# Patient Record
Sex: Female | Born: 1940
Health system: Southern US, Community
[De-identification: ages and names within clinical notes are randomized; demographics above are authoritative.]

## PROBLEM LIST (undated history)

## (undated) DIAGNOSIS — J189 Pneumonia, unspecified organism: Secondary | ICD-10-CM

## (undated) DIAGNOSIS — H3530 Unspecified macular degeneration: Secondary | ICD-10-CM

## (undated) DIAGNOSIS — Z9889 Other specified postprocedural states: Secondary | ICD-10-CM

## (undated) DIAGNOSIS — F32A Depression, unspecified: Secondary | ICD-10-CM

## (undated) DIAGNOSIS — E119 Type 2 diabetes mellitus without complications: Secondary | ICD-10-CM

## (undated) DIAGNOSIS — F329 Major depressive disorder, single episode, unspecified: Secondary | ICD-10-CM

## (undated) DIAGNOSIS — I10 Essential (primary) hypertension: Secondary | ICD-10-CM

## (undated) DIAGNOSIS — R32 Unspecified urinary incontinence: Secondary | ICD-10-CM

## (undated) HISTORY — PX: OTHER SURGICAL HISTORY: SHX169

## (undated) HISTORY — DX: Depression, unspecified: F32.A

## (undated) HISTORY — DX: Type 2 diabetes mellitus without complications: E11.9

## (undated) HISTORY — DX: Major depressive disorder, single episode, unspecified: F32.9

## (undated) HISTORY — PX: ABDOMINAL HYSTERECTOMY: SHX81

## (undated) HISTORY — PX: APPENDECTOMY: SHX54

## (undated) HISTORY — DX: Other specified postprocedural states: Z98.890

## (undated) HISTORY — DX: Unspecified macular degeneration: H35.30

## (undated) HISTORY — DX: Unspecified urinary incontinence: R32

---

## 1991-08-12 HISTORY — PX: BREAST EXCISIONAL BIOPSY: SUR124

## 1996-08-11 HISTORY — PX: BREAST EXCISIONAL BIOPSY: SUR124

## 1997-08-11 ENCOUNTER — Encounter (INDEPENDENT_AMBULATORY_CARE_PROVIDER_SITE_OTHER): Payer: Self-pay | Admitting: *Deleted

## 1997-08-11 LAB — CONVERTED CEMR LAB

## 1997-11-20 ENCOUNTER — Encounter: Admission: RE | Admit: 1997-11-20 | Discharge: 1997-11-20 | Payer: Self-pay | Admitting: Family Medicine

## 1997-11-28 ENCOUNTER — Encounter: Admission: RE | Admit: 1997-11-28 | Discharge: 1997-11-28 | Payer: Self-pay | Admitting: Family Medicine

## 1997-12-22 ENCOUNTER — Encounter: Admission: RE | Admit: 1997-12-22 | Discharge: 1997-12-22 | Payer: Self-pay | Admitting: Family Medicine

## 1998-01-02 ENCOUNTER — Encounter: Admission: RE | Admit: 1998-01-02 | Discharge: 1998-01-02 | Payer: Self-pay | Admitting: Family Medicine

## 1998-01-09 ENCOUNTER — Encounter: Admission: RE | Admit: 1998-01-09 | Discharge: 1998-01-09 | Payer: Self-pay | Admitting: Sports Medicine

## 1998-11-12 ENCOUNTER — Encounter: Admission: RE | Admit: 1998-11-12 | Discharge: 1998-11-12 | Payer: Self-pay | Admitting: Family Medicine

## 1999-04-12 ENCOUNTER — Encounter: Admission: RE | Admit: 1999-04-12 | Discharge: 1999-04-12 | Payer: Self-pay | Admitting: Family Medicine

## 1999-04-12 ENCOUNTER — Ambulatory Visit (HOSPITAL_COMMUNITY): Admission: RE | Admit: 1999-04-12 | Discharge: 1999-04-12 | Payer: Self-pay | Admitting: Family Medicine

## 1999-05-08 ENCOUNTER — Encounter: Admission: RE | Admit: 1999-05-08 | Discharge: 1999-05-08 | Payer: Self-pay | Admitting: Family Medicine

## 1999-05-20 ENCOUNTER — Encounter: Admission: RE | Admit: 1999-05-20 | Discharge: 1999-05-20 | Payer: Self-pay | Admitting: Family Medicine

## 1999-05-27 ENCOUNTER — Encounter: Admission: RE | Admit: 1999-05-27 | Discharge: 1999-05-27 | Payer: Self-pay | Admitting: Family Medicine

## 1999-05-29 ENCOUNTER — Encounter: Admission: RE | Admit: 1999-05-29 | Discharge: 1999-05-29 | Payer: Self-pay | Admitting: Family Medicine

## 1999-05-31 ENCOUNTER — Encounter: Admission: RE | Admit: 1999-05-31 | Discharge: 1999-05-31 | Payer: Self-pay | Admitting: Family Medicine

## 1999-06-10 ENCOUNTER — Encounter: Payer: Self-pay | Admitting: Family Medicine

## 1999-06-10 ENCOUNTER — Ambulatory Visit (HOSPITAL_COMMUNITY): Admission: RE | Admit: 1999-06-10 | Discharge: 1999-06-10 | Payer: Self-pay | Admitting: Family Medicine

## 1999-07-15 ENCOUNTER — Encounter: Admission: RE | Admit: 1999-07-15 | Discharge: 1999-07-15 | Payer: Self-pay | Admitting: Family Medicine

## 1999-08-02 ENCOUNTER — Encounter: Admission: RE | Admit: 1999-08-02 | Discharge: 1999-08-02 | Payer: Self-pay | Admitting: Sports Medicine

## 1999-08-26 ENCOUNTER — Encounter: Admission: RE | Admit: 1999-08-26 | Discharge: 1999-08-26 | Payer: Self-pay | Admitting: Family Medicine

## 1999-08-30 ENCOUNTER — Encounter: Admission: RE | Admit: 1999-08-30 | Discharge: 1999-08-30 | Payer: Self-pay | Admitting: Family Medicine

## 1999-11-13 ENCOUNTER — Encounter: Admission: RE | Admit: 1999-11-13 | Discharge: 1999-11-13 | Payer: Self-pay | Admitting: Family Medicine

## 1999-12-02 ENCOUNTER — Encounter: Admission: RE | Admit: 1999-12-02 | Discharge: 1999-12-02 | Payer: Self-pay | Admitting: Family Medicine

## 1999-12-23 ENCOUNTER — Encounter: Admission: RE | Admit: 1999-12-23 | Discharge: 1999-12-23 | Payer: Self-pay | Admitting: Family Medicine

## 2000-02-10 ENCOUNTER — Encounter: Admission: RE | Admit: 2000-02-10 | Discharge: 2000-02-10 | Payer: Self-pay | Admitting: Family Medicine

## 2000-02-24 ENCOUNTER — Encounter: Admission: RE | Admit: 2000-02-24 | Discharge: 2000-02-24 | Payer: Self-pay | Admitting: Family Medicine

## 2000-03-17 ENCOUNTER — Encounter: Admission: RE | Admit: 2000-03-17 | Discharge: 2000-03-17 | Payer: Self-pay | Admitting: Family Medicine

## 2000-06-19 ENCOUNTER — Encounter: Admission: RE | Admit: 2000-06-19 | Discharge: 2000-06-19 | Payer: Self-pay | Admitting: Family Medicine

## 2000-06-30 ENCOUNTER — Ambulatory Visit (HOSPITAL_COMMUNITY): Admission: RE | Admit: 2000-06-30 | Discharge: 2000-06-30 | Payer: Self-pay

## 2000-07-23 ENCOUNTER — Encounter: Admission: RE | Admit: 2000-07-23 | Discharge: 2000-07-23 | Payer: Self-pay | Admitting: Family Medicine

## 2000-12-18 ENCOUNTER — Encounter: Admission: RE | Admit: 2000-12-18 | Discharge: 2000-12-18 | Payer: Self-pay | Admitting: Family Medicine

## 2001-03-05 ENCOUNTER — Encounter: Admission: RE | Admit: 2001-03-05 | Discharge: 2001-03-05 | Payer: Self-pay | Admitting: Family Medicine

## 2001-03-12 ENCOUNTER — Encounter: Admission: RE | Admit: 2001-03-12 | Discharge: 2001-03-12 | Payer: Self-pay | Admitting: Family Medicine

## 2001-03-22 ENCOUNTER — Encounter: Admission: RE | Admit: 2001-03-22 | Discharge: 2001-03-22 | Payer: Self-pay | Admitting: Family Medicine

## 2001-07-02 ENCOUNTER — Ambulatory Visit (HOSPITAL_COMMUNITY): Admission: RE | Admit: 2001-07-02 | Discharge: 2001-07-02 | Payer: Self-pay | Admitting: Family Medicine

## 2001-07-02 ENCOUNTER — Encounter: Payer: Self-pay | Admitting: Family Medicine

## 2001-10-06 ENCOUNTER — Encounter (INDEPENDENT_AMBULATORY_CARE_PROVIDER_SITE_OTHER): Payer: Self-pay | Admitting: *Deleted

## 2001-10-06 ENCOUNTER — Ambulatory Visit (HOSPITAL_BASED_OUTPATIENT_CLINIC_OR_DEPARTMENT_OTHER): Admission: RE | Admit: 2001-10-06 | Discharge: 2001-10-06 | Payer: Self-pay | Admitting: Orthopedic Surgery

## 2001-11-01 ENCOUNTER — Encounter: Admission: RE | Admit: 2001-11-01 | Discharge: 2001-11-01 | Payer: Self-pay | Admitting: Sports Medicine

## 2001-11-30 ENCOUNTER — Ambulatory Visit (HOSPITAL_COMMUNITY): Admission: RE | Admit: 2001-11-30 | Discharge: 2001-11-30 | Payer: Self-pay | Admitting: Orthopedic Surgery

## 2001-11-30 ENCOUNTER — Encounter: Payer: Self-pay | Admitting: Orthopedic Surgery

## 2001-12-06 ENCOUNTER — Encounter: Payer: Self-pay | Admitting: Sports Medicine

## 2001-12-06 ENCOUNTER — Encounter: Admission: RE | Admit: 2001-12-06 | Discharge: 2001-12-06 | Payer: Self-pay | Admitting: Sports Medicine

## 2001-12-06 ENCOUNTER — Encounter: Admission: RE | Admit: 2001-12-06 | Discharge: 2001-12-06 | Payer: Self-pay | Admitting: Family Medicine

## 2001-12-14 ENCOUNTER — Ambulatory Visit (HOSPITAL_BASED_OUTPATIENT_CLINIC_OR_DEPARTMENT_OTHER): Admission: RE | Admit: 2001-12-14 | Discharge: 2001-12-14 | Payer: Self-pay | Admitting: Orthopedic Surgery

## 2001-12-31 ENCOUNTER — Encounter: Admission: RE | Admit: 2001-12-31 | Discharge: 2001-12-31 | Payer: Self-pay | Admitting: Family Medicine

## 2002-01-12 ENCOUNTER — Encounter: Payer: Self-pay | Admitting: Cardiovascular Disease

## 2002-01-12 ENCOUNTER — Ambulatory Visit (HOSPITAL_COMMUNITY): Admission: RE | Admit: 2002-01-12 | Discharge: 2002-01-12 | Payer: Self-pay | Admitting: Family Medicine

## 2002-01-31 ENCOUNTER — Encounter: Admission: RE | Admit: 2002-01-31 | Discharge: 2002-01-31 | Payer: Self-pay | Admitting: Family Medicine

## 2002-03-11 ENCOUNTER — Emergency Department (HOSPITAL_COMMUNITY): Admission: EM | Admit: 2002-03-11 | Discharge: 2002-03-11 | Payer: Self-pay

## 2002-06-06 ENCOUNTER — Encounter: Admission: RE | Admit: 2002-06-06 | Discharge: 2002-06-06 | Payer: Self-pay | Admitting: Family Medicine

## 2002-06-20 ENCOUNTER — Encounter: Admission: RE | Admit: 2002-06-20 | Discharge: 2002-06-20 | Payer: Self-pay | Admitting: Family Medicine

## 2002-07-15 ENCOUNTER — Encounter: Admission: RE | Admit: 2002-07-15 | Discharge: 2002-07-15 | Payer: Self-pay | Admitting: Family Medicine

## 2002-07-18 ENCOUNTER — Encounter: Admission: RE | Admit: 2002-07-18 | Discharge: 2002-07-18 | Payer: Self-pay | Admitting: Family Medicine

## 2002-08-12 ENCOUNTER — Encounter: Admission: RE | Admit: 2002-08-12 | Discharge: 2002-08-12 | Payer: Self-pay | Admitting: Family Medicine

## 2002-08-18 ENCOUNTER — Ambulatory Visit (HOSPITAL_BASED_OUTPATIENT_CLINIC_OR_DEPARTMENT_OTHER): Admission: RE | Admit: 2002-08-18 | Discharge: 2002-08-19 | Payer: Self-pay | Admitting: Orthopedic Surgery

## 2002-09-09 ENCOUNTER — Encounter: Admission: RE | Admit: 2002-09-09 | Discharge: 2002-09-09 | Payer: Self-pay | Admitting: Family Medicine

## 2002-09-16 ENCOUNTER — Encounter: Payer: Self-pay | Admitting: Family Medicine

## 2002-09-16 ENCOUNTER — Ambulatory Visit (HOSPITAL_COMMUNITY): Admission: RE | Admit: 2002-09-16 | Discharge: 2002-09-16 | Payer: Self-pay | Admitting: Family Medicine

## 2002-09-30 ENCOUNTER — Encounter: Admission: RE | Admit: 2002-09-30 | Discharge: 2002-09-30 | Payer: Self-pay | Admitting: Family Medicine

## 2002-10-31 ENCOUNTER — Encounter: Admission: RE | Admit: 2002-10-31 | Discharge: 2002-10-31 | Payer: Self-pay | Admitting: Family Medicine

## 2003-06-12 ENCOUNTER — Encounter: Admission: RE | Admit: 2003-06-12 | Discharge: 2003-06-12 | Payer: Self-pay | Admitting: Family Medicine

## 2003-09-04 ENCOUNTER — Encounter: Admission: RE | Admit: 2003-09-04 | Discharge: 2003-09-04 | Payer: Self-pay | Admitting: Sports Medicine

## 2003-09-04 ENCOUNTER — Inpatient Hospital Stay (HOSPITAL_COMMUNITY): Admission: EM | Admit: 2003-09-04 | Discharge: 2003-09-08 | Payer: Self-pay | Admitting: Emergency Medicine

## 2003-09-04 ENCOUNTER — Encounter: Admission: RE | Admit: 2003-09-04 | Discharge: 2003-09-04 | Payer: Self-pay | Admitting: Family Medicine

## 2003-09-14 ENCOUNTER — Encounter: Admission: RE | Admit: 2003-09-14 | Discharge: 2003-09-14 | Payer: Self-pay | Admitting: Sports Medicine

## 2003-09-18 ENCOUNTER — Encounter: Admission: RE | Admit: 2003-09-18 | Discharge: 2003-09-18 | Payer: Self-pay | Admitting: Family Medicine

## 2004-01-01 ENCOUNTER — Ambulatory Visit (HOSPITAL_COMMUNITY): Admission: RE | Admit: 2004-01-01 | Discharge: 2004-01-01 | Payer: Self-pay | Admitting: Gastroenterology

## 2004-01-01 ENCOUNTER — Encounter (INDEPENDENT_AMBULATORY_CARE_PROVIDER_SITE_OTHER): Payer: Self-pay | Admitting: *Deleted

## 2004-03-15 ENCOUNTER — Encounter: Admission: RE | Admit: 2004-03-15 | Discharge: 2004-03-15 | Payer: Self-pay | Admitting: Family Medicine

## 2004-05-03 ENCOUNTER — Ambulatory Visit: Payer: Self-pay | Admitting: Family Medicine

## 2004-05-10 ENCOUNTER — Ambulatory Visit (HOSPITAL_COMMUNITY): Admission: RE | Admit: 2004-05-10 | Discharge: 2004-05-10 | Payer: Self-pay

## 2004-05-13 ENCOUNTER — Ambulatory Visit: Payer: Self-pay | Admitting: Family Medicine

## 2004-06-03 ENCOUNTER — Ambulatory Visit: Payer: Self-pay | Admitting: Family Medicine

## 2004-06-05 ENCOUNTER — Encounter: Admission: RE | Admit: 2004-06-05 | Discharge: 2004-06-19 | Payer: Self-pay | Admitting: Family Medicine

## 2004-06-19 ENCOUNTER — Ambulatory Visit: Payer: Self-pay | Admitting: Family Medicine

## 2004-07-12 ENCOUNTER — Ambulatory Visit: Payer: Self-pay | Admitting: Family Medicine

## 2004-07-22 ENCOUNTER — Ambulatory Visit: Payer: Self-pay | Admitting: Family Medicine

## 2004-07-22 ENCOUNTER — Encounter: Admission: RE | Admit: 2004-07-22 | Discharge: 2004-07-22 | Payer: Self-pay | Admitting: Family Medicine

## 2004-08-14 ENCOUNTER — Ambulatory Visit: Payer: Self-pay | Admitting: Family Medicine

## 2004-08-23 ENCOUNTER — Ambulatory Visit: Payer: Self-pay | Admitting: Family Medicine

## 2004-09-20 ENCOUNTER — Ambulatory Visit: Payer: Self-pay | Admitting: Sports Medicine

## 2004-11-15 ENCOUNTER — Ambulatory Visit: Payer: Self-pay | Admitting: Family Medicine

## 2005-01-10 ENCOUNTER — Ambulatory Visit: Payer: Self-pay | Admitting: Family Medicine

## 2005-03-03 ENCOUNTER — Ambulatory Visit: Payer: Self-pay | Admitting: Family Medicine

## 2005-03-05 ENCOUNTER — Ambulatory Visit: Payer: Self-pay | Admitting: Family Medicine

## 2005-05-30 ENCOUNTER — Ambulatory Visit: Payer: Self-pay | Admitting: Family Medicine

## 2005-06-16 ENCOUNTER — Ambulatory Visit: Payer: Self-pay | Admitting: Family Medicine

## 2005-06-20 ENCOUNTER — Ambulatory Visit (HOSPITAL_COMMUNITY): Admission: RE | Admit: 2005-06-20 | Discharge: 2005-06-20 | Payer: Self-pay | Admitting: Family Medicine

## 2005-10-10 ENCOUNTER — Ambulatory Visit: Payer: Self-pay | Admitting: Family Medicine

## 2005-10-13 ENCOUNTER — Ambulatory Visit (HOSPITAL_COMMUNITY): Admission: RE | Admit: 2005-10-13 | Discharge: 2005-10-13 | Payer: Self-pay | Admitting: Family Medicine

## 2005-10-20 ENCOUNTER — Ambulatory Visit: Payer: Self-pay | Admitting: Family Medicine

## 2005-12-08 ENCOUNTER — Ambulatory Visit: Payer: Self-pay | Admitting: Sports Medicine

## 2005-12-09 ENCOUNTER — Encounter: Admission: RE | Admit: 2005-12-09 | Discharge: 2005-12-09 | Payer: Self-pay | Admitting: Sports Medicine

## 2006-04-24 ENCOUNTER — Ambulatory Visit: Payer: Self-pay | Admitting: Family Medicine

## 2006-04-30 ENCOUNTER — Ambulatory Visit: Payer: Self-pay | Admitting: Family Medicine

## 2006-05-08 ENCOUNTER — Ambulatory Visit: Payer: Self-pay | Admitting: Family Medicine

## 2006-06-15 ENCOUNTER — Ambulatory Visit: Payer: Self-pay | Admitting: Family Medicine

## 2006-06-23 ENCOUNTER — Encounter: Admission: RE | Admit: 2006-06-23 | Discharge: 2006-09-21 | Payer: Self-pay | Admitting: Family Medicine

## 2006-08-12 ENCOUNTER — Encounter: Admission: RE | Admit: 2006-08-12 | Discharge: 2006-08-12 | Payer: Self-pay | Admitting: Family Medicine

## 2006-08-20 ENCOUNTER — Encounter: Admission: RE | Admit: 2006-08-20 | Discharge: 2006-08-20 | Payer: Self-pay | Admitting: Family Medicine

## 2006-09-18 ENCOUNTER — Ambulatory Visit: Payer: Self-pay | Admitting: Family Medicine

## 2006-09-18 LAB — CONVERTED CEMR LAB
ALT: 25 units/L (ref 0–35)
AST: 32 units/L (ref 0–37)
Albumin: 4.5 g/dL (ref 3.5–5.2)
Alkaline Phosphatase: 110 units/L (ref 39–117)
BUN: 20 mg/dL (ref 6–23)
CO2: 27 meq/L (ref 19–32)
Calcium: 10.1 mg/dL (ref 8.4–10.5)
Chloride: 102 meq/L (ref 96–112)
Creatinine, Ser: 0.92 mg/dL (ref 0.40–1.20)
Glucose, Bld: 79 mg/dL (ref 70–99)
Potassium: 4.1 meq/L (ref 3.5–5.3)
Sodium: 140 meq/L (ref 135–145)
Total Bilirubin: 0.3 mg/dL (ref 0.3–1.2)
Total Protein: 7 g/dL (ref 6.0–8.3)

## 2006-10-08 DIAGNOSIS — E7800 Pure hypercholesterolemia, unspecified: Secondary | ICD-10-CM | POA: Insufficient documentation

## 2006-10-08 DIAGNOSIS — K2100 Gastro-esophageal reflux disease with esophagitis, without bleeding: Secondary | ICD-10-CM | POA: Insufficient documentation

## 2006-10-08 DIAGNOSIS — R42 Dizziness and giddiness: Secondary | ICD-10-CM | POA: Insufficient documentation

## 2006-10-08 DIAGNOSIS — L409 Psoriasis, unspecified: Secondary | ICD-10-CM | POA: Insufficient documentation

## 2006-10-08 DIAGNOSIS — G43909 Migraine, unspecified, not intractable, without status migrainosus: Secondary | ICD-10-CM | POA: Insufficient documentation

## 2006-10-08 DIAGNOSIS — I10 Essential (primary) hypertension: Secondary | ICD-10-CM | POA: Insufficient documentation

## 2006-10-08 DIAGNOSIS — E118 Type 2 diabetes mellitus with unspecified complications: Secondary | ICD-10-CM | POA: Insufficient documentation

## 2006-10-08 DIAGNOSIS — G5600 Carpal tunnel syndrome, unspecified upper limb: Secondary | ICD-10-CM | POA: Insufficient documentation

## 2006-10-08 DIAGNOSIS — E119 Type 2 diabetes mellitus without complications: Secondary | ICD-10-CM | POA: Insufficient documentation

## 2006-10-08 DIAGNOSIS — F4323 Adjustment disorder with mixed anxiety and depressed mood: Secondary | ICD-10-CM | POA: Insufficient documentation

## 2006-10-08 DIAGNOSIS — K279 Peptic ulcer, site unspecified, unspecified as acute or chronic, without hemorrhage or perforation: Secondary | ICD-10-CM | POA: Insufficient documentation

## 2006-10-08 DIAGNOSIS — E1165 Type 2 diabetes mellitus with hyperglycemia: Secondary | ICD-10-CM | POA: Insufficient documentation

## 2006-10-08 DIAGNOSIS — K649 Unspecified hemorrhoids: Secondary | ICD-10-CM | POA: Insufficient documentation

## 2006-10-08 DIAGNOSIS — L309 Dermatitis, unspecified: Secondary | ICD-10-CM | POA: Insufficient documentation

## 2006-10-08 DIAGNOSIS — M159 Polyosteoarthritis, unspecified: Secondary | ICD-10-CM | POA: Insufficient documentation

## 2006-10-08 DIAGNOSIS — M479 Spondylosis, unspecified: Secondary | ICD-10-CM | POA: Insufficient documentation

## 2006-10-08 DIAGNOSIS — E781 Pure hyperglyceridemia: Secondary | ICD-10-CM | POA: Insufficient documentation

## 2006-10-08 DIAGNOSIS — J309 Allergic rhinitis, unspecified: Secondary | ICD-10-CM | POA: Insufficient documentation

## 2006-10-08 DIAGNOSIS — J45909 Unspecified asthma, uncomplicated: Secondary | ICD-10-CM | POA: Insufficient documentation

## 2006-10-09 ENCOUNTER — Encounter (INDEPENDENT_AMBULATORY_CARE_PROVIDER_SITE_OTHER): Payer: Self-pay | Admitting: *Deleted

## 2006-12-25 ENCOUNTER — Ambulatory Visit: Payer: Self-pay | Admitting: Family Medicine

## 2006-12-25 LAB — CONVERTED CEMR LAB: Hgb A1c MFr Bld: 5.9 %

## 2007-04-21 ENCOUNTER — Telehealth (INDEPENDENT_AMBULATORY_CARE_PROVIDER_SITE_OTHER): Payer: Self-pay | Admitting: Family Medicine

## 2007-04-26 ENCOUNTER — Ambulatory Visit: Payer: Self-pay | Admitting: Family Medicine

## 2007-04-26 LAB — CONVERTED CEMR LAB: Hgb A1c MFr Bld: 6.7 %

## 2007-04-27 ENCOUNTER — Encounter: Payer: Self-pay | Admitting: Family Medicine

## 2007-05-24 ENCOUNTER — Ambulatory Visit: Payer: Self-pay | Admitting: Family Medicine

## 2007-05-26 ENCOUNTER — Telehealth: Payer: Self-pay | Admitting: Family Medicine

## 2007-05-27 ENCOUNTER — Encounter: Payer: Self-pay | Admitting: Family Medicine

## 2007-05-27 ENCOUNTER — Observation Stay (HOSPITAL_COMMUNITY): Admission: EM | Admit: 2007-05-27 | Discharge: 2007-05-29 | Payer: Self-pay | Admitting: Emergency Medicine

## 2007-05-27 ENCOUNTER — Telehealth: Payer: Self-pay | Admitting: *Deleted

## 2007-05-27 ENCOUNTER — Ambulatory Visit: Payer: Self-pay | Admitting: Family Medicine

## 2007-05-27 DIAGNOSIS — R0789 Other chest pain: Secondary | ICD-10-CM | POA: Insufficient documentation

## 2007-06-07 ENCOUNTER — Ambulatory Visit: Payer: Self-pay | Admitting: Family Medicine

## 2007-06-07 LAB — CONVERTED CEMR LAB
Bilirubin Urine: NEGATIVE
Glucose, Urine, Semiquant: NEGATIVE
HCT: 39.3 % (ref 36.0–46.0)
Hemoglobin: 12.6 g/dL (ref 12.0–15.0)
Ketones, urine, test strip: NEGATIVE
MCHC: 32.1 g/dL (ref 30.0–36.0)
MCV: 90.3 fL (ref 78.0–100.0)
Nitrite: NEGATIVE
Platelets: 179 10*3/uL (ref 150–400)
Protein, U semiquant: NEGATIVE
RBC: 4.35 M/uL (ref 3.87–5.11)
RDW: 13.9 % (ref 11.5–14.0)
Specific Gravity, Urine: 1.015
Urobilinogen, UA: 0.2
WBC: 8.2 10*3/uL (ref 4.0–10.5)
pH: 5.5

## 2007-07-02 ENCOUNTER — Encounter: Payer: Self-pay | Admitting: Family Medicine

## 2007-07-12 ENCOUNTER — Ambulatory Visit: Payer: Self-pay | Admitting: Family Medicine

## 2007-07-12 DIAGNOSIS — M719 Bursopathy, unspecified: Secondary | ICD-10-CM

## 2007-07-12 DIAGNOSIS — M67919 Unspecified disorder of synovium and tendon, unspecified shoulder: Secondary | ICD-10-CM | POA: Insufficient documentation

## 2007-07-12 LAB — CONVERTED CEMR LAB: Hgb A1c MFr Bld: 6.2 %

## 2007-07-20 ENCOUNTER — Telehealth (INDEPENDENT_AMBULATORY_CARE_PROVIDER_SITE_OTHER): Payer: Self-pay | Admitting: Family Medicine

## 2007-07-21 ENCOUNTER — Ambulatory Visit: Payer: Self-pay | Admitting: Family Medicine

## 2007-07-26 ENCOUNTER — Telehealth: Payer: Self-pay | Admitting: *Deleted

## 2007-07-26 ENCOUNTER — Ambulatory Visit (HOSPITAL_COMMUNITY): Admission: RE | Admit: 2007-07-26 | Discharge: 2007-07-26 | Payer: Self-pay | Admitting: Family Medicine

## 2007-07-28 ENCOUNTER — Encounter: Payer: Self-pay | Admitting: Family Medicine

## 2007-07-29 ENCOUNTER — Telehealth: Payer: Self-pay | Admitting: Family Medicine

## 2007-07-29 ENCOUNTER — Telehealth: Payer: Self-pay | Admitting: *Deleted

## 2007-07-30 ENCOUNTER — Encounter: Payer: Self-pay | Admitting: Family Medicine

## 2007-07-30 ENCOUNTER — Ambulatory Visit: Payer: Self-pay | Admitting: Family Medicine

## 2007-07-30 LAB — CONVERTED CEMR LAB
BUN: 23 mg/dL (ref 6–23)
Creatinine, Ser: 1.14 mg/dL (ref 0.40–1.20)

## 2007-09-23 ENCOUNTER — Encounter: Admission: RE | Admit: 2007-09-23 | Discharge: 2007-09-23 | Payer: Self-pay | Admitting: Family Medicine

## 2007-11-12 ENCOUNTER — Ambulatory Visit: Payer: Self-pay | Admitting: Family Medicine

## 2007-11-12 DIAGNOSIS — K589 Irritable bowel syndrome without diarrhea: Secondary | ICD-10-CM | POA: Insufficient documentation

## 2007-11-12 LAB — CONVERTED CEMR LAB: Hgb A1c MFr Bld: 6.3 %

## 2008-01-04 ENCOUNTER — Encounter: Payer: Self-pay | Admitting: Family Medicine

## 2008-01-04 ENCOUNTER — Ambulatory Visit: Payer: Self-pay | Admitting: Family Medicine

## 2008-01-05 ENCOUNTER — Encounter: Payer: Self-pay | Admitting: Family Medicine

## 2008-01-05 LAB — CONVERTED CEMR LAB
ALT: 23 units/L
AST: 34 units/L
Albumin: 4.7 g/dL
Alkaline Phosphatase: 77 units/L
BUN: 15 mg/dL
Bilirubin, Direct: 0.1 mg/dL
CO2: 26 meq/L
Calcium: 9.8 mg/dL
Chloride: 103 meq/L
Creatinine, Ser: 1.03 mg/dL
Glucose, Bld: 107 mg/dL
HCT: 41.3 %
Hemoglobin: 12.9 g/dL
Indirect Bilirubin: 0.4 mg/dL
MCHC: 89.8 g/dL
Platelets: 185 10*3/uL
Potassium: 4.4 meq/L
Sodium: 139 meq/L
TSH: 2.125 microintl units/mL
Total Bilirubin: 0.5 mg/dL
Total Protein: 7.1 g/dL
WBC: 6.2 10*3/uL

## 2008-01-11 ENCOUNTER — Ambulatory Visit (HOSPITAL_COMMUNITY): Admission: RE | Admit: 2008-01-11 | Discharge: 2008-01-11 | Payer: Self-pay | Admitting: Gastroenterology

## 2008-01-25 ENCOUNTER — Encounter: Payer: Self-pay | Admitting: Family Medicine

## 2008-01-28 ENCOUNTER — Ambulatory Visit: Payer: Self-pay | Admitting: Family Medicine

## 2008-01-28 LAB — CONVERTED CEMR LAB
Cholesterol: 144 mg/dL (ref 0–200)
HDL: 53 mg/dL (ref >39)
Hgb A1c MFr Bld: 6.4 %
LDL Cholesterol: 54 mg/dL (ref 0–99)
Total CHOL/HDL Ratio: 2.7
Triglycerides: 186 mg/dL — ABNORMAL HIGH (ref <150)
VLDL: 37 mg/dL (ref 0–40)

## 2008-01-31 ENCOUNTER — Telehealth (INDEPENDENT_AMBULATORY_CARE_PROVIDER_SITE_OTHER): Payer: Self-pay | Admitting: *Deleted

## 2008-04-05 ENCOUNTER — Telehealth: Payer: Self-pay | Admitting: *Deleted

## 2008-04-14 ENCOUNTER — Ambulatory Visit: Payer: Self-pay | Admitting: Family Medicine

## 2008-04-14 LAB — CONVERTED CEMR LAB: Hgb A1c MFr Bld: 6.6 %

## 2008-04-18 ENCOUNTER — Telehealth: Payer: Self-pay | Admitting: *Deleted

## 2008-05-17 ENCOUNTER — Ambulatory Visit: Payer: Self-pay | Admitting: Family Medicine

## 2008-07-21 ENCOUNTER — Ambulatory Visit: Payer: Self-pay | Admitting: Family Medicine

## 2008-07-21 LAB — CONVERTED CEMR LAB: Hgb A1c MFr Bld: 6.1 %

## 2008-08-07 ENCOUNTER — Encounter: Payer: Self-pay | Admitting: Family Medicine

## 2008-09-01 ENCOUNTER — Telehealth: Payer: Self-pay | Admitting: *Deleted

## 2008-09-04 ENCOUNTER — Encounter: Payer: Self-pay | Admitting: Family Medicine

## 2008-09-13 ENCOUNTER — Encounter: Payer: Self-pay | Admitting: Family Medicine

## 2008-10-24 ENCOUNTER — Encounter: Admission: RE | Admit: 2008-10-24 | Discharge: 2008-10-24 | Payer: Self-pay | Admitting: Family Medicine

## 2008-11-01 ENCOUNTER — Ambulatory Visit: Payer: Self-pay | Admitting: Family Medicine

## 2008-11-01 LAB — CONVERTED CEMR LAB
ALT: 17 units/L (ref 0–35)
AST: 25 units/L (ref 0–37)
Albumin: 4.6 g/dL (ref 3.5–5.2)
Alkaline Phosphatase: 97 units/L (ref 39–117)
BUN: 20 mg/dL (ref 6–23)
CO2: 24 meq/L (ref 19–32)
Calcium: 10 mg/dL (ref 8.4–10.5)
Chloride: 104 meq/L (ref 96–112)
Creatinine, Ser: 0.93 mg/dL (ref 0.40–1.20)
Glucose, Bld: 100 mg/dL — ABNORMAL HIGH (ref 70–99)
Hgb A1c MFr Bld: 6.3 %
Potassium: 4.2 meq/L (ref 3.5–5.3)
Sodium: 141 meq/L (ref 135–145)
Total Bilirubin: 0.4 mg/dL (ref 0.3–1.2)
Total Protein: 7 g/dL (ref 6.0–8.3)

## 2008-11-10 IMAGING — CR DG CHEST 1V PORT
1 series · 1 of 1 positions shown · non-contrast
Comparison: 07/25/04.

CLINICAL DATA: Chest pain.  
 PORTABLE CHEST - 1 VIEW ? 05/27/07:

[view not recorded]
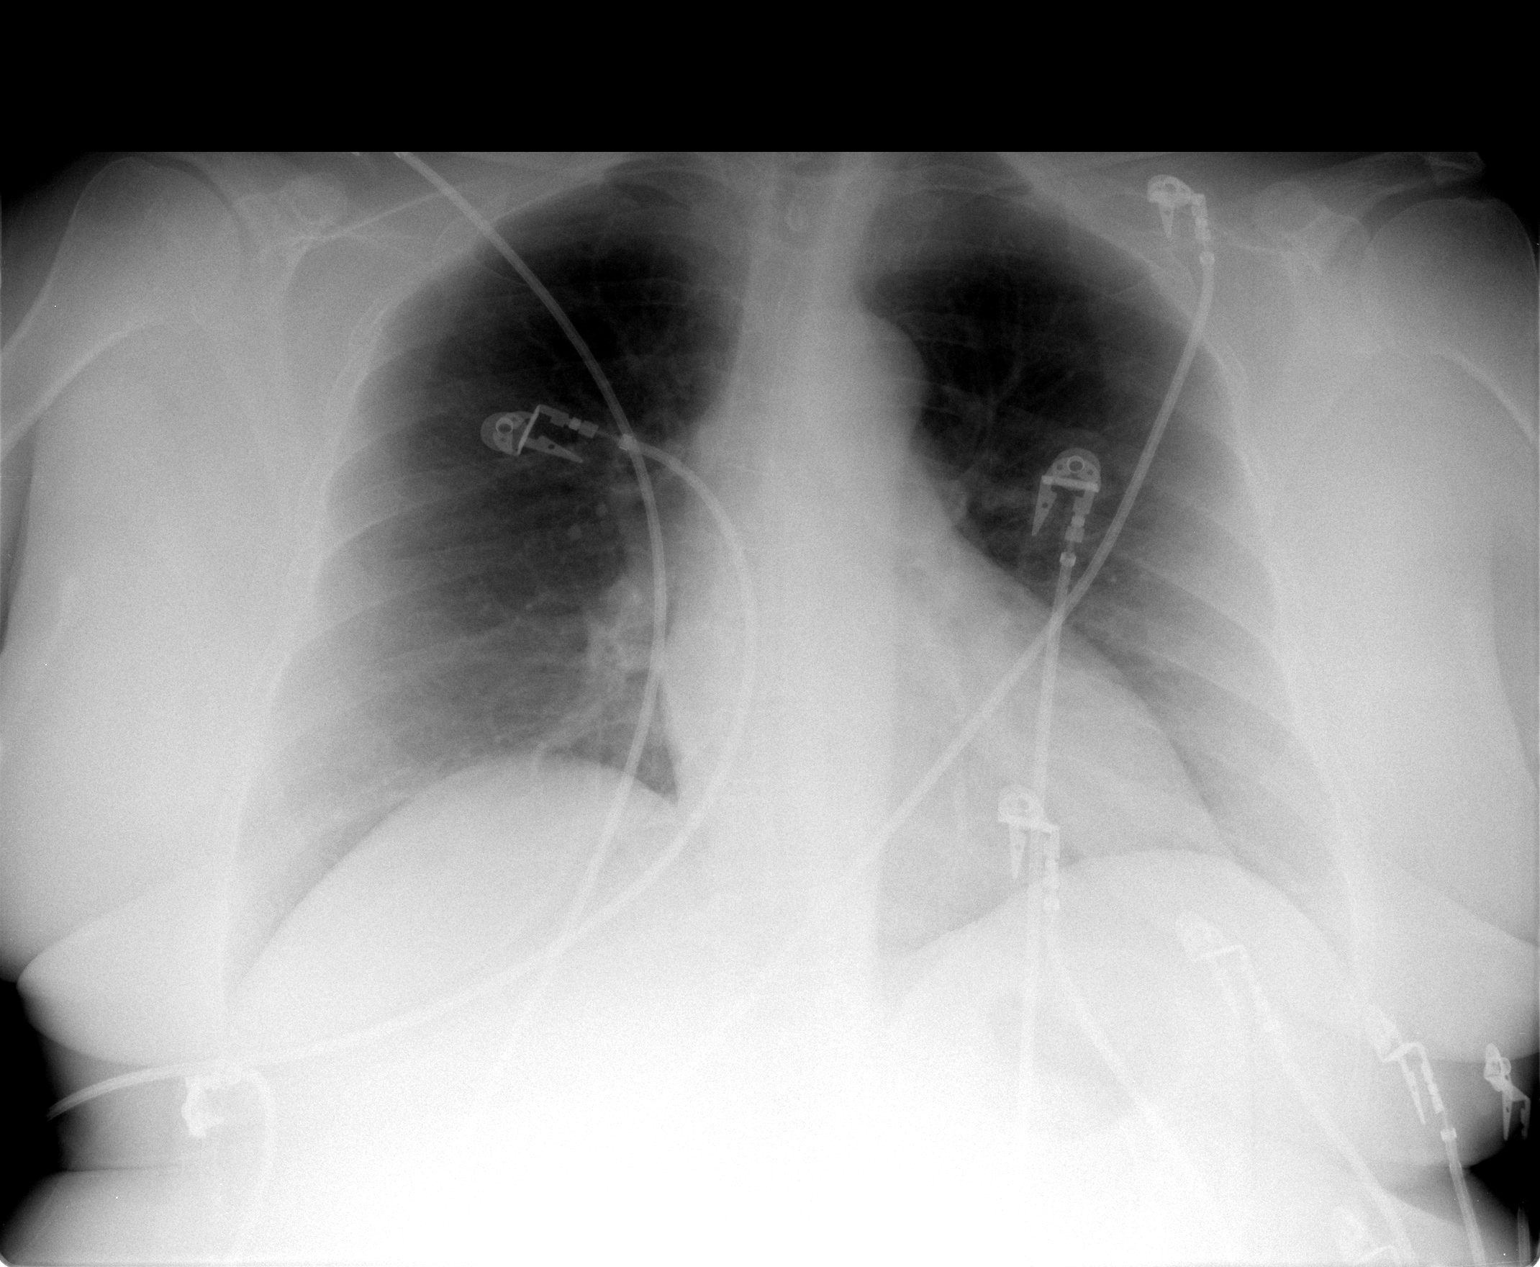

[1 of 1 positions shown; findings below may reference images not displayed]

FINDINGS: Heart and lungs normal for portable technique.   Osseous structures intact in one view.
IMPRESSION: No active disease.

## 2009-03-23 ENCOUNTER — Ambulatory Visit (HOSPITAL_COMMUNITY): Admission: RE | Admit: 2009-03-23 | Discharge: 2009-03-23 | Payer: Self-pay | Admitting: Family Medicine

## 2009-03-23 ENCOUNTER — Ambulatory Visit: Payer: Self-pay | Admitting: Family Medicine

## 2009-03-23 LAB — CONVERTED CEMR LAB
BUN: 17 mg/dL (ref 6–23)
CO2: 24 meq/L (ref 19–32)
Calcium: 9.6 mg/dL (ref 8.4–10.5)
Chloride: 105 meq/L (ref 96–112)
Cholesterol: 153 mg/dL (ref 0–200)
Creatinine, Ser: 0.89 mg/dL (ref 0.40–1.20)
Glucose, Bld: 90 mg/dL (ref 70–99)
HDL: 45 mg/dL (ref >39)
Hgb A1c MFr Bld: 6.2 %
LDL Cholesterol: 67 mg/dL (ref 0–99)
Potassium: 4.2 meq/L (ref 3.5–5.3)
Sodium: 140 meq/L (ref 135–145)
Total CHOL/HDL Ratio: 3.4
Triglycerides: 205 mg/dL — ABNORMAL HIGH (ref <150)
VLDL: 41 mg/dL — ABNORMAL HIGH (ref 0–40)

## 2009-03-26 ENCOUNTER — Ambulatory Visit (HOSPITAL_COMMUNITY): Admission: RE | Admit: 2009-03-26 | Discharge: 2009-03-26 | Payer: Self-pay | Admitting: Family Medicine

## 2009-03-26 ENCOUNTER — Telehealth: Payer: Self-pay | Admitting: Family Medicine

## 2009-05-25 ENCOUNTER — Ambulatory Visit: Payer: Self-pay | Admitting: Family Medicine

## 2009-06-04 ENCOUNTER — Encounter: Payer: Self-pay | Admitting: Family Medicine

## 2009-07-10 ENCOUNTER — Encounter: Payer: Self-pay | Admitting: Family Medicine

## 2009-07-20 ENCOUNTER — Ambulatory Visit: Payer: Self-pay | Admitting: Family Medicine

## 2009-07-20 DIAGNOSIS — J984 Other disorders of lung: Secondary | ICD-10-CM | POA: Insufficient documentation

## 2009-07-20 DIAGNOSIS — M545 Low back pain, unspecified: Secondary | ICD-10-CM | POA: Insufficient documentation

## 2009-07-20 LAB — CONVERTED CEMR LAB: Hgb A1c MFr Bld: 6.4 %

## 2009-07-27 ENCOUNTER — Telehealth: Payer: Self-pay | Admitting: *Deleted

## 2009-07-31 ENCOUNTER — Telehealth: Payer: Self-pay | Admitting: *Deleted

## 2009-07-31 ENCOUNTER — Telehealth: Payer: Self-pay | Admitting: Family Medicine

## 2009-08-14 ENCOUNTER — Telehealth: Payer: Self-pay | Admitting: *Deleted

## 2009-08-17 ENCOUNTER — Encounter: Payer: Self-pay | Admitting: *Deleted

## 2009-08-17 ENCOUNTER — Ambulatory Visit (HOSPITAL_COMMUNITY): Admission: RE | Admit: 2009-08-17 | Discharge: 2009-08-17 | Payer: Self-pay | Admitting: Family Medicine

## 2009-09-11 ENCOUNTER — Telehealth: Payer: Self-pay | Admitting: Family Medicine

## 2009-09-28 ENCOUNTER — Encounter: Admission: RE | Admit: 2009-09-28 | Discharge: 2009-09-28 | Payer: Self-pay | Admitting: Family Medicine

## 2009-10-01 ENCOUNTER — Encounter (INDEPENDENT_AMBULATORY_CARE_PROVIDER_SITE_OTHER): Payer: Self-pay | Admitting: *Deleted

## 2009-10-01 ENCOUNTER — Telehealth (INDEPENDENT_AMBULATORY_CARE_PROVIDER_SITE_OTHER): Payer: Self-pay | Admitting: Family Medicine

## 2009-10-10 ENCOUNTER — Encounter: Admission: RE | Admit: 2009-10-10 | Discharge: 2009-10-10 | Payer: Self-pay | Admitting: Family Medicine

## 2009-10-10 ENCOUNTER — Encounter: Payer: Self-pay | Admitting: Family Medicine

## 2009-10-10 HISTORY — PX: BREAST BIOPSY: SHX20

## 2009-10-19 ENCOUNTER — Ambulatory Visit: Payer: Self-pay | Admitting: Family Medicine

## 2009-10-19 LAB — CONVERTED CEMR LAB
ALT: 22 units/L (ref 0–35)
AST: 31 units/L (ref 0–37)
Albumin: 4.7 g/dL (ref 3.5–5.2)
Alkaline Phosphatase: 98 units/L (ref 39–117)
BUN: 19 mg/dL (ref 6–23)
CO2: 24 meq/L (ref 19–32)
Calcium: 9.9 mg/dL (ref 8.4–10.5)
Chloride: 104 meq/L (ref 96–112)
Creatinine, Ser: 0.97 mg/dL (ref 0.40–1.20)
Glucose, Bld: 83 mg/dL (ref 70–99)
Hgb A1c MFr Bld: 6.1 %
Potassium: 4.5 meq/L (ref 3.5–5.3)
Sodium: 141 meq/L (ref 135–145)
Total Bilirubin: 0.3 mg/dL (ref 0.3–1.2)
Total Protein: 7 g/dL (ref 6.0–8.3)

## 2009-10-25 ENCOUNTER — Encounter: Admission: RE | Admit: 2009-10-25 | Discharge: 2009-10-25 | Payer: Self-pay | Admitting: Family Medicine

## 2009-11-07 ENCOUNTER — Encounter: Payer: Self-pay | Admitting: Family Medicine

## 2009-11-21 ENCOUNTER — Ambulatory Visit: Payer: Self-pay | Admitting: Family Medicine

## 2009-11-21 LAB — CONVERTED CEMR LAB
Bilirubin Urine: NEGATIVE
Blood in Urine, dipstick: NEGATIVE
Glucose, Urine, Semiquant: NEGATIVE
Ketones, urine, test strip: NEGATIVE
Nitrite: NEGATIVE
Protein, U semiquant: NEGATIVE
Specific Gravity, Urine: 1.015
Urobilinogen, UA: 0.2
pH: 5.5

## 2010-02-06 ENCOUNTER — Ambulatory Visit: Payer: Self-pay | Admitting: Family Medicine

## 2010-02-06 DIAGNOSIS — G609 Hereditary and idiopathic neuropathy, unspecified: Secondary | ICD-10-CM | POA: Insufficient documentation

## 2010-02-06 LAB — CONVERTED CEMR LAB: Hgb A1c MFr Bld: 6.7 %

## 2010-05-02 ENCOUNTER — Encounter: Payer: Self-pay | Admitting: Family Medicine

## 2010-05-10 ENCOUNTER — Ambulatory Visit (HOSPITAL_COMMUNITY): Admission: RE | Admit: 2010-05-10 | Discharge: 2010-05-10 | Payer: Self-pay | Admitting: Family Medicine

## 2010-05-10 ENCOUNTER — Encounter: Payer: Self-pay | Admitting: Family Medicine

## 2010-05-10 ENCOUNTER — Telehealth: Payer: Self-pay | Admitting: *Deleted

## 2010-05-10 ENCOUNTER — Ambulatory Visit: Payer: Self-pay | Admitting: Family Medicine

## 2010-05-22 ENCOUNTER — Ambulatory Visit: Payer: Self-pay | Admitting: Family Medicine

## 2010-05-22 DIAGNOSIS — G4730 Sleep apnea, unspecified: Secondary | ICD-10-CM | POA: Insufficient documentation

## 2010-05-22 LAB — CONVERTED CEMR LAB: Hgb A1c MFr Bld: 6.1 %

## 2010-05-23 ENCOUNTER — Encounter: Payer: Self-pay | Admitting: Family Medicine

## 2010-05-23 LAB — CONVERTED CEMR LAB
ALT: 32 units/L (ref 0–35)
AST: 43 units/L — ABNORMAL HIGH (ref 0–37)
Albumin: 4.3 g/dL (ref 3.5–5.2)
Alkaline Phosphatase: 92 units/L (ref 39–117)
BUN: 20 mg/dL (ref 6–23)
CO2: 25 meq/L (ref 19–32)
Calcium: 9.1 mg/dL (ref 8.4–10.5)
Chloride: 108 meq/L (ref 96–112)
Cholesterol: 145 mg/dL (ref 0–200)
Creatinine, Ser: 0.98 mg/dL (ref 0.40–1.20)
Glucose, Bld: 83 mg/dL (ref 70–99)
HDL: 47 mg/dL (ref >39)
LDL Cholesterol: 51 mg/dL (ref 0–99)
Potassium: 3.9 meq/L (ref 3.5–5.3)
Sodium: 144 meq/L (ref 135–145)
Total Bilirubin: 0.4 mg/dL (ref 0.3–1.2)
Total CHOL/HDL Ratio: 3.1
Total Protein: 6.6 g/dL (ref 6.0–8.3)
Triglycerides: 235 mg/dL — ABNORMAL HIGH (ref <150)
VLDL: 47 mg/dL — ABNORMAL HIGH (ref 0–40)

## 2010-07-17 ENCOUNTER — Ambulatory Visit: Payer: Self-pay

## 2010-07-17 ENCOUNTER — Ambulatory Visit: Payer: Self-pay | Admitting: Family Medicine

## 2010-07-17 LAB — CONVERTED CEMR LAB
Bilirubin Urine: NEGATIVE
Epithelial cells, urine: <5 /lpf
Glucose, Urine, Semiquant: NEGATIVE
Ketones, urine, test strip: NEGATIVE
Nitrite: NEGATIVE
Protein, U semiquant: 30
Specific Gravity, Urine: 1.025
Urobilinogen, UA: 0.2
pH: 7

## 2010-07-25 ENCOUNTER — Encounter: Payer: Self-pay | Admitting: Family Medicine

## 2010-07-25 ENCOUNTER — Ambulatory Visit: Payer: Self-pay | Admitting: Family Medicine

## 2010-07-25 LAB — CONVERTED CEMR LAB
Bilirubin Urine: NEGATIVE
Blood in Urine, dipstick: NEGATIVE
Glucose, Urine, Semiquant: NEGATIVE
Ketones, urine, test strip: NEGATIVE
Nitrite: NEGATIVE
Protein, U semiquant: NEGATIVE
Specific Gravity, Urine: 1.015
Urobilinogen, UA: 0.2
pH: 7

## 2010-07-26 LAB — CONVERTED CEMR LAB
HCT: 37.8 % (ref 36.0–46.0)
Hemoglobin: 12.3 g/dL (ref 12.0–15.0)
MCHC: 32.5 g/dL (ref 30.0–36.0)
MCV: 86.1 fL (ref 78.0–100.0)
Platelets: 160 10*3/uL (ref 150–400)
RBC: 4.39 M/uL (ref 3.87–5.11)
RDW: 14.5 % (ref 11.5–15.5)
WBC: 6 10*3/uL (ref 4.0–10.5)

## 2010-08-06 ENCOUNTER — Encounter: Payer: Self-pay | Admitting: Family Medicine

## 2010-08-07 ENCOUNTER — Ambulatory Visit: Payer: Self-pay

## 2010-08-14 ENCOUNTER — Ambulatory Visit: Admission: RE | Admit: 2010-08-14 | Discharge: 2010-08-14 | Payer: Self-pay | Source: Home / Self Care

## 2010-08-14 LAB — CONVERTED CEMR LAB: Hgb A1c MFr Bld: 5.9 %

## 2010-08-19 ENCOUNTER — Ambulatory Visit (HOSPITAL_COMMUNITY)
Admission: RE | Admit: 2010-08-19 | Discharge: 2010-08-19 | Payer: Self-pay | Source: Home / Self Care | Attending: Family Medicine | Admitting: Family Medicine

## 2010-08-31 ENCOUNTER — Encounter: Payer: Self-pay | Admitting: Vascular Surgery

## 2010-09-01 ENCOUNTER — Encounter: Payer: Self-pay | Admitting: Family Medicine

## 2010-09-10 NOTE — Miscellaneous (Signed)
Summary: CT approved  Clinical Lists Changes CT thorax approved by [facility] plan.[name]  August 17, 2009 8:42 AM

## 2010-09-10 NOTE — Progress Notes (Signed)
Summary: status of rx  Phone Note Call from Patient Call back at Home Phone [phone]   Reason for Call: Talk to Nurse Summary of Call: pt is checking status of her rxs, [facility] has already faxed, just waiting for a response. Requesting we page dr [name] so it will be taken care of today. Initial call taken by: ERIN LEVAN,  May 26, 2007 4:24 PM  Follow-up for Phone Call        I have not received any faxes or e refill requests.  What meds need refilled and what pharmacy Follow-up by: Doralee Albino MD,  May 26, 2007 5:09 PM  Additional Follow-up for Phone Call Additional follow up Details #1::        Never mind.  I see the refills which came in as e refills to the refill nurse.  I will complete

## 2010-09-10 NOTE — Miscellaneous (Signed)
Summary: nortriptylin refill  Clinical Lists Changes  Medications: Changed medication from NORTRIPTYLINE HCL 75 MG CAPS (NORTRIPTYLINE HCL) one by mouth at bedtime to NORTRIPTYLINE HCL 50 MG CAPS (NORTRIPTYLINE HCL) one by mouth qhs - Signed Rx of NORTRIPTYLINE HCL 50 MG CAPS (NORTRIPTYLINE HCL) one by mouth qhs;  #31 x 12;  Signed;  Entered by: Doralee Albino MD;  Authorized by: Doralee Albino MD;  Method used: Electronically to [facility] [address] [place], [place], [place]  [address], Ph: [phone], Fax: [phone]    Prescriptions: NORTRIPTYLINE HCL 50 MG CAPS (NORTRIPTYLINE HCL) one by mouth qhs  #31 x 12   Entered and Authorized by:   Doralee Albino MD   Signed by:   Doralee Albino MD on 08/07/2008   Method used:   Electronically to        [facility]* (retail)       [address] [place], [place]  [address]       Ph: [phone]       Fax: [phone]   RxID:   [id number]

## 2010-09-10 NOTE — Progress Notes (Signed)
Summary: triage  Phone Note Call from Patient Call back at Home Phone [phone]   Caller: Patient Summary of Call: last night had chest pains - called insurance nurse- suggest going to ED after belching a couple of times she felt better and went to sleep  today she feels like something is stuck in her throat Initial call taken by: De Nurse,  May 10, 2010 2:27 PM  Follow-up for Phone Call        Ate dinner at 7:45 last night.  Went to bed and expereinced a heaviness in her upper chest after lying down.  Thought it was related to her CPAP setting changes.  Sat up for a while and the sensation finally went away.  No associated diaphoresis, radiating pains or SOB.  Has not had these symptoms today however does feel like something is stuck in her throat.  She is concerned about this event.  Does have a dx of GERD.  Advised her to come to the clinc this afternoon so we could at least do an EKG to r/o any cardiac issues.  [place] appt made for 3:15. Follow-up by: Dennison Nancy RN,  May 10, 2010 2:44 PM

## 2010-09-10 NOTE — Miscellaneous (Signed)
Summary: Update meds  Clinical Lists Changes Brought in a few additions to her med list for my records. Medications: Added new medication of ANTIVERT 25 MG TABS (MECLIZINE HCL) one by mouth q6h as needed dizziness Added new medication of TESSALON 200 MG CAPS (BENZONATATE) one by mouth q6h as needed cough Added new medication of ALIGN  CAPS (PROBIOTIC PRODUCT) per OTC instructions Added new medication of DICLOFENAC POTASSIUM 50 MG TABS (DICLOFENAC POTASSIUM) one by mouth q8h as needed pain.  No more than 2 days per week.

## 2010-09-10 NOTE — Assessment & Plan Note (Signed)
Summary: stomach problems wp   Vital Signs:  Patient Profile:   70 Years Old Female Height:     59.5 inches Weight:      193 pounds Temp:     98.3 degrees F Pulse rate:   64 / minute BP sitting:   106 / 74  (left arm)  Pt. in pain?   no  Vitals Entered By: Jacki Cones RN (November 12, 2007 11:23 AM)                  PCP:  Doralee Albino MD  Chief Complaint:  stomach cramping and severe diarrhea and then periods of constipation.  History of Present Illness: has had symptoms on and off with stomach for last 6-8 weeks.  Began jounal to document sx  3/23 stomach cramps and diarrhea - used immodium  Blocked up until 3/26 when cramps and diarrhea again  Several more spells of vacillating between constipation and diarrhea.  has already tried lactose free diet.    Has had lifelong, intermitant symptoms.  Never told IBS.  At one point dxed with colitits  Last Flex Sig:  Done. (01/10/2004 12:00:00 AM) Flex Sig Next Due:  Not Indicated Last Hemoccult Result: Done. (06/12/2003 12:00:00 AM) Hemoccult Next Due:  Not Indicated Last PAP:  Done. (08/11/1997 12:00:00 AM) PAP Next Due:  Not Indicated    Current Allergies: ! HYDROCODONE-ACETAMINOPHEN (HYDROCODONE-ACETAMINOPHEN) AMOXICILLIN (AMOXICILLIN) AZITHROMYCIN (AZITHROMYCIN) CLINDAMYCIN HCL (CLINDAMYCIN HCL) CODEINE PHOSPHATE (CODEINE PHOSPHATE) DEMEROL (MEPERIDINE HCL) ETODOLAC (ETODOLAC) IMITREX (SUMATRIPTAN) NAPROSYN (NAPROXEN) NEURONTIN (GABAPENTIN) SULFAMETHOXAZOLE (SULFAMETHOXAZOLE) TALWIN NX (PENTAZOCINE-NALOXONE)      Physical Exam  General:     Well-developed,well-nourished,in no acute distress; alert,appropriate and cooperative throughout examination Abdomen:     Bowel sounds positive,abdomen soft and non-tender without masses, organomegaly or hernias noted.    Impression & Recommendations:  Problem # 1:  IRRITABLE BOWEL SYNDROME (ICD-564.1) Discussed probiotics (Danactive yogurt)  May need  mirilax.  Minimize immodium use.  For now stay on high fiber diet.  May need to alter that in the future. Orders: [facility]- Est  Level 4 (81191)   Problem # 2:  DIABETES MELLITUS II, UNCOMPLICATED (ICD-250.00) Good control Her updated medication list for this problem includes:    Aspirin Childrens 81 Mg Chew (Aspirin) .Marland Kitchen... Take 1 tablet by mouth once a day    Enalapril Maleate 10 Mg Tabs (Enalapril maleate) .Marland Kitchen... Take 1 tablet by mouth once a day    Metformin Hcl 500 Mg Tabs (Metformin hcl) .Marland Kitchen... Take 1 tablet by mouth twice a day  Orders: A1C-[facility] (47829)   Complete Medication List: 1)  Albuterol 90 Mcg/act Aers (Albuterol) .... Inhale 2 puff using inhaler every four hours 2)  Aspirin Childrens 81 Mg Chew (Aspirin) .... Take 1 tablet by mouth once a day 3)  Atenolol 100 Mg Tabs (Atenolol) .... Take 1 tablet by mouth twice a day 4)  Benzonatate 200 Mg Caps (Benzonatate) .... Take 1 capsule by mouth twice a day 5)  Cimetidine 400 Mg Tabs (Cimetidine) .... Take 1 tablet by mouth twice a day 6)  Cyclobenzaprine Hcl 10 Mg Tabs (Cyclobenzaprine hcl) .... Take 1 tablet by mouth every night 7)  Enalapril Maleate 10 Mg Tabs (Enalapril maleate) .... Take 1 tablet by mouth once a day 8)  Flovent Hfa 220 Mcg/act Aero (Fluticasone propionate  hfa) .... Inhale 1 puff as directed twice a day 9)  Hydrochlorothiazide 12.5 Mg Caps (Hydrochlorothiazide) .Marland Kitchen.. 1 capsule by mouth once a day 10)  Loratadine  10 Mg Tabs (Loratadine) .Marland Kitchen.. 1 tablet by mouth once a day 11)  Meclizine Hcl 25 Mg Tabs (Meclizine hcl) .... Take 1 tablet by mouth every six hours 12)  Metformin Hcl 500 Mg Tabs (Metformin hcl) .... Take 1 tablet by mouth twice a day 13)  Potassium Chloride Cr 10 Meq Tbcr (Potassium chloride) .... Take 1 cpasule by mouth once a day 14)  Propoxyphene N-apap 100-650 Mg Tabs (Propoxyphene n-apap) .... Take 1 tablet by mouth five times a day 15)  Simvastatin 20 Mg Tabs (Simvastatin) .... Take 1 tablet by mouth  every night 16)  Tramadol Hcl 50 Mg Tabs (Tramadol hcl) .... Take 1 tablet by mouth every six hours 17)  Triamcinolone Acetonide 0.5 % Crea (Triamcinolone acetonide) .... Apply to affected areas bid 18)  Nortriptyline Hcl 50 Mg Caps (Nortriptyline hcl) .... One by mouth at bedtime 19)  Cipro 500 Mg Tabs (Ciprofloxacin hcl) .... One by mouth two times a day 20)  Freestyle Lancets Misc (Lancets) .... Use as directed 21)  Freestyle Lite Strp (Glucose blood) .... Use as directed 22)  Percocet 5-325 Mg Tabs (Oxycodone-acetaminophen) .... One by mouth q4h as needed pain 23)  Miralax Powd (Polyethylene glycol 3350) .Marland Kitchen.. 17g by mouth once daily as needed constipation disp qs for one month supply  Other Orders: Tetanus Toxoid w/Dx (16109) Admin 1st Vaccine (60454)     Prescriptions: MIRALAX   POWD (POLYETHYLENE GLYCOL 3350) 17g by mouth once daily as needed constipation Disp qs for one month supply  #30 x 12   Entered and Authorized by:   Doralee Albino MD   Signed by:   Doralee Albino MD on 11/12/2007   Method used:   Print then Give to Patient   RxID:   [id number] POTASSIUM CHLORIDE CR 10 MEQ TBCR (POTASSIUM CHLORIDE) Take 1 cpasule by mouth once a day  #30 x 12   Entered and Authorized by:   Doralee Albino MD   Signed by:   Terese Door on 11/12/2007   Method used:   Electronically sent to ...       [facility]*       [address] [place], [place]  [address]       Ph: [phone]       Fax: [phone]   RxID:   [id number]  ] Laboratory Results   Blood Tests   Date/Time Received: November 12, 2007 11:21 AM  Date/Time Reported: November 12, 2007 11:44 AM    Comments: ...................................................................[facility]  November 12, 2007 11:44 AM       Tetanus/Td Vaccine    Vaccine Type: Td    Site: right deltoid    Mfr: Sanofi Pasteur    Dose: 0.5 ml    Route: IM    Given by: [name] MARTIN RN    Exp.  Date: 10/27/2008    Lot #: [id number]    VIS given: 06/29/07 version given November 12, 2007.

## 2010-09-10 NOTE — Progress Notes (Signed)
Summary: Test Res  Phone Note Call from Patient Call back at Home Phone [phone]   Caller: Patient Summary of Call: would like to get results of CT Scan done today. Initial call taken by: Clydell Hakim,  March 26, 2009 2:07 PM  Follow-up for Phone Call        pt is still waiting to hear from Dr Follow-up by: De Nurse,  March 26, 2009 4:13 PM  Additional Follow-up for Phone Call Additional follow up Details #1::        called and explained to patient that Dr. [name] has not had the  opportunity to see her report yet . she had the CT scan today. advised patient that RN would send message to MD to please advise about report as soon as possible  and we will give her a call back tomorrow. .. Additional Follow-up by: Theresia Lo RN,  March 26, 2009 4:45 PM    Additional Follow-up for Phone Call Additional follow up Details #2::    Called and discussed.  I will repeat CT in 4 months which is mid December.  Patient aware and agrees. Follow-up by: Doralee Albino MD,  March 27, 2009 8:23 AM

## 2010-09-10 NOTE — Progress Notes (Signed)
Summary: Triage  Phone Note Call from Patient Call back at [phone]   Summary of Call: Pt is wanting to speak with a nurse about needing a cortisone shot. Initial call taken by: Haydee Salter,  July 20, 2007 2:57 PM  Follow-up for Phone Call        Spoke with pt- she states she has been doing recommended shoulder exercises, and shoulder pain has gotten worse.  States she has been icing and using icy hot, with no relief.  States she cannot hold a glass of water with this arm and hand.  States Dr. [name] advised her to call if no better for a cortisone shot.  Will send to Dr. [name] to advise when he would like pt to come in, as he is booked for the rest of the week. Follow-up by: [name] MARTIN RN,  July 20, 2007 3:11 PM  Additional Follow-up for Phone Call Additional follow up Details #1::        Tell her to come in tomorrow at 11 AM and I will see Additional Follow-up by: Doralee Albino MD,  July 20, 2007 3:40 PM    Additional Follow-up for Phone Call Additional follow up Details #2::    Spoke with pt- she will come in tomorrow at 11am to be seen by Dr. [name]. Follow-up by: [name] MARTIN RN,  July 20, 2007 3:46 PM

## 2010-09-10 NOTE — Assessment & Plan Note (Signed)
Summary: 3 mo f/u,df   Vital Signs:  Patient profile:   71 year old female Height:      59.5 inches Weight:      185.4 pounds BMI:     36.95 Temp:     98.5 degrees F oral Pulse rate:   77 / minute BP sitting:   112 / 73  (left arm) Cuff size:   regular  Vitals Entered By: [name] (October 19, 2009 1:54 PM) CC: recheck diabetes, etc Is Patient Diabetic? Yes Did you bring your meter with you today? Yes Pain Assessment Patient in pain? no        Primary Care Provider:  Doralee Albino MD  CC:  recheck diabetes and etc.  History of Present Illness: In for recheck multiple problems DM, seems to be going quite well.  Home BS good.  A1C=6.1 this visit.  She is due for an eye check this month.  Asked to have opth send me a copy of the report. HBP has been running quite well - in fact, has several BPs on the lowish side.  No CO lightheadedness Wonders what to take for her back and other pains.  She has been weaned off all pain meds by the headache center.  Habits & Providers  Alcohol-Tobacco-Diet     Tobacco Status: never  Current Medications (verified): 1)  Albuterol 90 Mcg/act Aers (Albuterol) .... Inhale 2 Puff Using Inhaler Every Four Hours 2)  Aspirin Childrens 81 Mg Chew (Aspirin) .... Take 1 Tablet By Mouth Once A Day 3)  Atenolol 100 Mg Tabs (Atenolol) .... Take 1/2 Tablet By Mouth Daily 4)  Cyclobenzaprine Hcl 10 Mg Tabs (Cyclobenzaprine Hcl) .... Take 1 Tablet By Mouth Every Night 5)  Enalapril Maleate 10 Mg Tabs (Enalapril Maleate) .... Take 1/2  Tablet By Mouth Once A Day 6)  Flovent Hfa 220 Mcg/act Aero (Fluticasone Propionate  Hfa) .... Inhale 1 Puff As Directed Twice A Day 7)  Hydrochlorothiazide 12.5 Mg Caps (Hydrochlorothiazide) .Marland Kitchen.. 1 Capsule By Mouth Once A Day 8)  Loratadine 10 Mg Tabs (Loratadine) .Marland Kitchen.. 1 Tablet By Mouth Once A Day 9)  Potassium Chloride Cr 10 Meq Tbcr (Potassium Chloride) .... Take 1 Cpasule By Mouth Once A Day 10)  Simvastatin 20 Mg  Tabs (Simvastatin) .... Take 1 Tablet By Mouth Every Night 11)  Triamcinolone Acetonide 0.5 %  Crea (Triamcinolone Acetonide) .... Apply To Affected Areas Bid 12)  Nortriptyline Hcl 50 Mg Caps (Nortriptyline Hcl) .... One By Mouth Qhs 13)  Freestyle Lancets   Misc (Lancets) .... Use As Directed 14)  Freestyle Lite   Strp (Glucose Blood) .... Use As Directed 15)  Centrum   Tabs (Multiple Vitamins-Minerals) .Marland Kitchen.. 1 Tablet By Mouth Once Daily 16)  Famotidine 40 Mg  Tabs (Famotidine) .... One By Mouth Daily 17)  Metformin Hcl 500 Mg Tabs (Metformin Hcl) .... One Daily 18)  Topamax 25 Mg Tabs (Topiramate) .... Two Pills By Mouth At Bedtime 19)  Baclofen 10 Mg Tabs (Baclofen) .... As Needed For Headache Per [facility] 20)  Lidoderm 5 % Ptch (Lidocaine) .... Per [facility] Center 21)  Tramadol Hcl 50 Mg  Tabs (Tramadol Hcl) .... One By Mouth Q6h As Needed Back Pain  Allergies (verified): 1)  ! Hydrocodone-Acetaminophen (Hydrocodone-Acetaminophen) 2)  Amoxicillin (Amoxicillin) 3)  Azithromycin (Azithromycin) 4)  Clindamycin Hcl (Clindamycin Hcl) 5)  Codeine Phosphate (Codeine Phosphate) 6)  Demerol (Meperidine Hcl) 7)  Etodolac (Etodolac) 8)  Imitrex (Sumatriptan) 9)  Naprosyn (Naproxen) 10)  Neurontin (Gabapentin) 11)  Sulfamethoxazole (Sulfamethoxazole) 12)  Talwin Nx  Past History:  Past medical, surgical, family and social histories (including risk factors) reviewed, and no changes noted (except as noted below).  Past Medical History: Reviewed history from 10/08/2006 and no changes required. Also takes butal/APA/caff for migraine., carbafed DM prn cough, fatty liver by ultrasound 3/07, HNP of cervical and lumbar spine, subdural hematoma from fall 1/05  Past Surgical History: Reviewed history from 10/08/2006 and no changes required. Appendectomy -, Barium Enema -, Bone Density 4/99 T=-0.4 -, breast surgery -, CT - spine -, CXR -, ECG -, Hysterectomy still has Rt. Ovary -, PFTs nl -  09/12/1999  Family History: Reviewed history from 10/08/2006 and no changes required. - DM, ETOHism, + Ca, HBP, CAD, CVA, depression,  Social History: Reviewed history from 10/08/2006 and no changes required. 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    Physical Exam  General:  Well-developed,well-nourished,in no acute distress; alert,appropriate and cooperative throughout examination Lungs:  Normal respiratory effort, chest expands symmetrically. Lungs are clear to auscultation, no crackles or wheezes. Heart:  Normal rate and regular rhythm. S1 and S2 normal without gallop, murmur, click, rub or other extra sounds. Abdomen:  Bowel sounds positive,abdomen soft and non-tender without masses, organomegaly or hernias noted.   Impression & Recommendations:  Problem # 1:  HYPERTENSION, BENIGN SYSTEMIC (ICD-401.1)  very well controled.  Decrease atenolol to once daily. Her updated medication list for this problem includes:    Atenolol 100 Mg Tabs (Atenolol) .Marland Kitchen... Take 1/2 tablet by mouth daily    Enalapril Maleate 10 Mg Tabs (Enalapril maleate) .Marland Kitchen... Take 1/2  tablet by mouth once a day    Hydrochlorothiazide 12.5 Mg Caps (Hydrochlorothiazide) .Marland Kitchen... 1 capsule by mouth once a day  Orders: [facility]- Est  Level 4 (96295)  Problem # 2:  DIABETES MELLITUS II, UNCOMPLICATED (ICD-250.00) Well controled.  Continue diet and exercise. Her updated medication list for this problem includes:    Aspirin Childrens 81 Mg Chew (Aspirin) .Marland Kitchen... Take 1 tablet by mouth once a day    Enalapril Maleate 10 Mg Tabs (Enalapril maleate) .Marland Kitchen... Take 1/2  tablet by mouth once a day    Metformin Hcl 500 Mg Tabs (Metformin hcl) ..... One daily  Orders: A1C-[facility] (28413) [facility]- Est  Level 4 (24401)  Problem  # 3:  HYPERTRIGLYCERIDEMIA (ICD-272.1)  Time to check Lfts Her updated medication list for this problem includes:    Simvastatin 20 Mg Tabs (Simvastatin) .Marland Kitchen... Take 1 tablet by mouth every night  Orders: [facility]- Est  Level 4 (99214)  Complete Medication List: 1)  Albuterol 90 Mcg/act Aers (Albuterol) .... Inhale 2 puff using inhaler every four hours 2)  Aspirin Childrens 81 Mg Chew (Aspirin) .... Take 1 tablet by mouth once a day 3)  Atenolol 100 Mg Tabs (Atenolol) .... Take 1/2 tablet by mouth daily 4)  Cyclobenzaprine Hcl 10 Mg Tabs (Cyclobenzaprine hcl) .... Take 1 tablet by mouth every night 5)  Enalapril Maleate 10 Mg Tabs (Enalapril maleate) .... Take 1/2  tablet by mouth once a day 6)  Flovent Hfa 220 Mcg/act Aero (Fluticasone propionate  hfa) .... Inhale 1 puff as directed twice a day 7)  Hydrochlorothiazide 12.5 Mg Caps (Hydrochlorothiazide) .Marland Kitchen.. 1 capsule by mouth once a day 8)  Loratadine  10 Mg Tabs (Loratadine) .Marland Kitchen.. 1 tablet by mouth once a day 9)  Potassium Chloride Cr 10 Meq Tbcr (Potassium chloride) .... Take 1 cpasule by mouth once a day 10)  Simvastatin 20 Mg Tabs (Simvastatin) .... Take 1 tablet by mouth every night 11)  Triamcinolone Acetonide 0.5 % Crea (Triamcinolone acetonide) .... Apply to affected areas bid 12)  Nortriptyline Hcl 50 Mg Caps (Nortriptyline hcl) .... One by mouth qhs 13)  Freestyle Lancets Misc (Lancets) .... Use as directed 14)  Freestyle Lite Strp (Glucose blood) .... Use as directed 15)  Centrum Tabs (Multiple vitamins-minerals) .Marland Kitchen.. 1 tablet by mouth once daily 16)  Famotidine 40 Mg Tabs (Famotidine) .... One by mouth daily 17)  Metformin Hcl 500 Mg Tabs (Metformin hcl) .... One daily 18)  Topamax 25 Mg Tabs (Topiramate) .... Two pills by mouth at bedtime 19)  Baclofen 10 Mg Tabs (Baclofen) .... As needed for headache per headache center 20)  Lidoderm 5 % Ptch (Lidocaine) .... Per headache center 21)  Tramadol Hcl 50 Mg Tabs (Tramadol hcl) ....  One by mouth q6h as needed back pain  Other Orders: [facility] ([id number]) Prescriptions: TRAMADOL HCL 50 MG  TABS (TRAMADOL HCL) one by mouth q6h as needed back pain  #60 x 5   Entered and Authorized by:   Doralee Albino MD   Signed by:   Doralee Albino MD on 10/19/2009   Method used:   Electronically to        [facility]* (retail)       [address] [place], [place]  [address]       Ph: [phone]       Fax: [phone]   RxID:   [id number] POTASSIUM CHLORIDE CR 10 MEQ TBCR (POTASSIUM CHLORIDE) Take 1 cpasule by mouth once a day  #90 x 3   Entered and Authorized by:   Doralee Albino MD   Signed by:   Doralee Albino MD on 10/19/2009   Method used:   Electronically to        [facility]* (retail)       [address] [place], [place]  [address]       Ph: [phone]       Fax: [phone]   RxID:   [id number]   Laboratory Results   Blood Tests   Date/Time Received: October 19, 2009 1:54 PM  Date/Time Reported: October 19, 2009 2:18 PM   HGBA1C: 6.1%   (Normal Range: Non-Diabetic - 3-6%   Control Diabetic - 6-8%)  Comments: ...........test performed by...........Marland KitchenTerese Door, CMA      Prevention & Chronic Care Immunizations   Influenza vaccine: Fluvax MCR  (05/25/2009)   Influenza vaccine due: 04/11/2010    Tetanus booster: 11/12/2007: given   Tetanus booster due: 11/11/2017    Pneumococcal vaccine: Pneumovax (Medicare)  (04/26/2007)   Pneumococcal vaccine due: None    H. zoster vaccine: 06/07/2007: Zostavax  Colorectal Screening   Hemoccult: Done.  (06/12/2003)   Hemoccult due: Not Indicated    Colonoscopy: Done.  (01/10/2004)   Colonoscopy due: 01/09/2014  Other Screening   Pap smear: Done.  (08/11/1997)   Pap smear due: Not Indicated    Mammogram: 16010.1^MM BREAST STEREO BIOPSY*R*  (10/10/2009)   Mammogram due: 10/24/2009    DXA bone density scan: normal   (01/04/2008)  DXA scan due: 01/03/2013    Smoking status: never  (10/19/2009)  Diabetes Mellitus   HgbA1C: 6.1  (10/19/2009)   Hemoglobin A1C due: 10/10/2007    Eye exam: No diabetic retinopathy.  Per patient   (08/11/2008)    Foot exam: yes  (03/23/2009)   Foot exam action/deferral: Do today   High risk foot: No  (03/23/2009)   Foot care education: Not documented   Foot exam due: 12/25/2007    Urine microalbumin/creatinine ratio: Not documented   Urine microalbumin action/deferral: Not indicated    Diabetes flowsheet reviewed?: Yes   Progress toward A1C goal: At goal  Lipids   Total Cholesterol: 153  (03/23/2009)   LDL: 67  (03/23/2009)   LDL Direct: Not documented   HDL: 45  (03/23/2009)   Triglycerides: 205  (03/23/2009)    SGOT (AST): 25  (11/01/2008)   SGPT (ALT): 17  (11/01/2008) CMP ordered    Alkaline phosphatase: 97  (11/01/2008)   Total bilirubin: 0.4  (11/01/2008)    Lipid flowsheet reviewed?: Yes   Progress toward LDL goal: At goal  Hypertension   Last Blood Pressure: 112 / 73  (10/19/2009)   Serum creatinine: 0.89  (03/23/2009)   Serum potassium 4.2  (03/23/2009) CMP ordered     Hypertension flowsheet reviewed?: Yes   Progress toward BP goal: At goal  Self-Management Support :   Personal Goals (by the next clinic visit) :     Personal A1C goal: 7  (07/20/2009)     Personal blood pressure goal: 130/80  (07/20/2009)     Personal LDL goal: 100  (07/20/2009)    Diabetes self-management support: Written self-care plan  (10/19/2009)   Diabetes care plan printed    Hypertension self-management support: Written self-care plan  (10/19/2009)   Hypertension self-care plan printed.    Lipid self-management support: Written self-care plan  (10/19/2009)   Lipid self-care plan printed.

## 2010-09-10 NOTE — Assessment & Plan Note (Signed)
Summary: ? urinary inf,tcb   Vital Signs:  Patient profile:   70 year old female Height:      59.5 inches Weight:      184.3 pounds BMI:     36.73 Temp:     98.2 degrees F oral Pulse rate:   94 / minute BP sitting:   96 / 65  (right arm) Cuff size:   large  Vitals Entered By: [name] (November 21, 2009 11:13 AM) CC: C/O painful,freq urination Is Patient Diabetic? No   Primary Care Provider:  Doralee Albino MD  CC:  C/O painful and freq urination.  History of Present Illness: 69 y/o female with 2 days of malodorous urine, 1 day of urinary frequency, urgency and feeling of incomplete voiding in addition to dysuria and suprapubic pain. no fever, chills, N/V. h/o UTI in the past. no BM since yesterday.   Current Medications (verified): 1)  Albuterol 90 Mcg/act Aers (Albuterol) .... Inhale 2 Puff Using Inhaler Every Four Hours 2)  Aspirin Childrens 81 Mg Chew (Aspirin) .... Take 1 Tablet By Mouth Once A Day 3)  Atenolol 100 Mg Tabs (Atenolol) .... Take 1/2 Tablet By Mouth Daily 4)  Cyclobenzaprine Hcl 10 Mg Tabs (Cyclobenzaprine Hcl) .... Take 1 Tablet By Mouth Every Night 5)  Enalapril Maleate 10 Mg Tabs (Enalapril Maleate) .... Take 1/2  Tablet By Mouth Once A Day 6)  Flovent Hfa 220 Mcg/act Aero (Fluticasone Propionate  Hfa) .... Inhale 1 Puff As Directed Twice A Day 7)  Hydrochlorothiazide 12.5 Mg Caps (Hydrochlorothiazide) .Marland Kitchen.. 1 Capsule By Mouth Once A Day 8)  Loratadine 10 Mg Tabs (Loratadine) .Marland Kitchen.. 1 Tablet By Mouth Once A Day 9)  Potassium Chloride Cr 10 Meq Tbcr (Potassium Chloride) .... Take 1 Cpasule By Mouth Once A Day 10)  Simvastatin 20 Mg Tabs (Simvastatin) .... Take 1 Tablet By Mouth Every Night 11)  Triamcinolone Acetonide 0.5 %  Crea (Triamcinolone Acetonide) .... Apply To Affected Areas Bid 12)  Nortriptyline Hcl 50 Mg Caps (Nortriptyline Hcl) .... One By Mouth Qhs 13)  Freestyle Lancets   Misc (Lancets) .... Use As Directed 14)  Freestyle Lite   Strp (Glucose  Blood) .... Use As Directed 15)  Centrum   Tabs (Multiple Vitamins-Minerals) .Marland Kitchen.. 1 Tablet By Mouth Once Daily 16)  Famotidine 40 Mg  Tabs (Famotidine) .... One By Mouth Daily 17)  Metformin Hcl 500 Mg Tabs (Metformin Hcl) .... One Daily 18)  Topamax 25 Mg Tabs (Topiramate) .... Two Pills By Mouth At Bedtime 19)  Baclofen 10 Mg Tabs (Baclofen) .... As Needed For [facility] 20)  Lidoderm 5 % Ptch (Lidocaine) .... Per [facility] 21)  Tramadol Hcl 50 Mg  Tabs (Tramadol Hcl) .... One By Mouth Q6h As Needed Back Pain 22)  Antivert 25 Mg Tabs (Meclizine Hcl) .... One By Mouth Q6h As Needed Dizziness 23)  Tessalon 200 Mg Caps (Benzonatate) .... One By Mouth Q6h As Needed Cough 24)  Align  Caps (Probiotic Product) .... Per Otc Instructions 25)  Diclofenac Potassium 50 Mg Tabs (Diclofenac Potassium) .... One By Mouth Q8h As Needed Pain.  No More Than 2 Days Per Week. 26)  Cipro 500 Mg Tabs (Ciprofloxacin Hcl) .... One Tab By Mouth Two Times A Day X5 Days.  Allergies (verified): 1)  ! Hydrocodone-Acetaminophen (Hydrocodone-Acetaminophen) 2)  Amoxicillin (Amoxicillin) 3)  Azithromycin (Azithromycin) 4)  Clindamycin Hcl (Clindamycin Hcl) 5)  Codeine Phosphate (Codeine Phosphate) 6)  Demerol (Meperidine Hcl) 7)  Etodolac (  Etodolac) 8)  Imitrex (Sumatriptan) 9)  Naprosyn (Naproxen) 10)  Neurontin (Gabapentin) 11)  Sulfamethoxazole (Sulfamethoxazole) 12)  Talwin Nx  Physical Exam  General:  well appearing female, NAD. vitals reviewed.  Abdomen:  Bowel sounds positive,abdomen soft. mild suprapubic TTP. no CVA tenderness bilaterally   Impression & Recommendations:  Problem # 1:  DYSURIA (ICD-788.1) Assessment New U/A c/w UTI. will treat with cipro given numerous allergies and success in the past. f/u as needed.   Her updated medication list for this problem includes:    Cipro 500 Mg Tabs (Ciprofloxacin hcl) ..... One tab by mouth two times a day x5  days.  Orders: Urinalysis-[facility] (00000) [facility]- Est Level  3 ([address])  Problem # 2:  LOW BLOOD PRESSURE (ICD-458.9) Assessment: New  discussed with PCP. will d/c HCTZ and potassium supplement. patient monitors BPs at home.  Orders: [facility]- Est Level  3 (60454)  Patient Instructions: 1)  Nice to have met you! 2)  I have sent a prescription for CIPROFLOXACIN to [facility] for your infection 3)  For your blood pressure STOP YOUR HYDROCHLOROTHIAZIDE AND  YOUR POTASSIUM PILLS! Prescriptions: CIPRO 500 MG TABS (CIPROFLOXACIN HCL) one tab by mouth two times a day x5 days.  #10 x 0   Entered and Authorized by:   Lequita Asal  MD   Signed by:   Lequita Asal  MD on 11/21/2009   Method used:   Electronically to        [facility]* (retail)       [address] [place], [place]  [address]       Ph: [phone]       Fax: [phone]   RxID:   [id number]   Laboratory Results   Urine Tests  Date/Time Received: November 21, 2009 11:21 AM  Date/Time Reported: November 21, 2009 11:39 AM   Routine Urinalysis   Color: yellow Appearance: Clear Glucose: negative   (Normal Range: Negative) Bilirubin: negative   (Normal Range: Negative) Ketone: negative   (Normal Range: Negative) Spec. Gravity: 1.015   (Normal Range: 1.003-1.035) Blood: negative   (Normal Range: Negative) pH: 5.5   (Normal Range: 5.0-8.0) Protein: negative   (Normal Range: Negative) Urobilinogen: 0.2   (Normal Range: 0-1) Nitrite: negative   (Normal Range: Negative) Leukocyte Esterace: small   (Normal Range: Negative)  Urine Microscopic WBC/HPF: 10-20 RBC/HPF: 0-3 Bacteria/HPF: 1+ Epithelial/HPF: 5-10    Comments: ...........test performed by...........Marland KitchenTerese Door, CMA      Prevention & Chronic Care Immunizations   Influenza vaccine: Fluvax MCR  (05/25/2009)   Influenza vaccine due: 04/11/2010    Tetanus booster: 11/12/2007: given   Tetanus booster due: 11/11/2017     Pneumococcal vaccine: Pneumovax (Medicare)  (04/26/2007)   Pneumococcal vaccine due: None    H. zoster vaccine: 06/07/2007: Zostavax  Colorectal Screening   Hemoccult: Done.  (06/12/2003)   Hemoccult due: Not Indicated    Colonoscopy: Done.  (01/10/2004)   Colonoscopy due: 01/09/2014  Other Screening   Pap smear: Done.  (08/11/1997)   Pap smear due: Not Indicated    Mammogram: ASSESSMENT: Negative - BI-RADS 1^MM DIGITAL SCREENING  (10/25/2009)   Mammogram due: 10/26/2010    DXA bone density scan: normal  (01/04/2008)   DXA scan due: 01/03/2013    Smoking status: never  (10/19/2009)  Diabetes Mellitus   HgbA1C: 6.1  (10/19/2009)   Hemoglobin A1C due: 10/10/2007    Eye exam: No  diabetic retinopathy.  Per patient   (08/11/2008)    Foot exam: yes  (03/23/2009)   Foot exam action/deferral: Do today   High risk foot: No  (03/23/2009)   Foot care education: Not documented   Foot exam due: 12/25/2007    Urine microalbumin/creatinine ratio: Not documented   Urine microalbumin action/deferral: Not indicated  Lipids   Total Cholesterol: 153  (03/23/2009)   LDL: 67  (03/23/2009)   LDL Direct: Not documented   HDL: 45  (03/23/2009)   Triglycerides: 205  (03/23/2009)    SGOT (AST): 31  (10/19/2009)   SGPT (ALT): 22  (10/19/2009)   Alkaline phosphatase: 98  (10/19/2009)   Total bilirubin: 0.3  (10/19/2009)  Hypertension   Last Blood Pressure: 96 / 65  (11/21/2009)   Serum creatinine: 0.97  (10/19/2009)   Serum potassium 4.5  (10/19/2009)    Hypertension flowsheet reviewed?: Yes   Progress toward BP goal: At goal  Self-Management Support :   Personal Goals (by the next clinic visit) :     Personal A1C goal: 7  (07/20/2009)     Personal blood pressure goal: 130/80  (07/20/2009)     Personal LDL goal: 100  (07/20/2009)    Diabetes self-management support: Written self-care plan  (10/19/2009)    Hypertension self-management support: Written self-care plan   (10/19/2009)    Hypertension self-management support not done because: Good outcomes  (11/21/2009)    Lipid self-management support: Written self-care plan  (10/19/2009)

## 2010-09-10 NOTE — Assessment & Plan Note (Signed)
Summary: Angina vs GERD/kf   Vital Signs:  Patient profile:   70 year old female Weight:      192 pounds Temp:     98.2 degrees F Pulse rate:   73 / minute BP sitting:   138 / 83  (left arm) Cuff size:   large  Vitals Entered By: Dennison Nancy RN (May 10, 2010 4:04 PM) CC: [place] for angina vs GERD Is Patient Diabetic? Yes Pain Assessment Patient in pain? no        Primary Provider:  Doralee Albino MD  CC:  [place] for angina vs GERD.  History of Present Illness: Pt is a 70 yo f who presents with chest pain that started the night before being seen.  This pain started after the patient had gone to bed and put her CPAP machine on.  She reports that the machine's settings had been very recently changed and that this has happened before in response to setting changes.  She got up, walked around, took tums (which did not help) and was eventually able to belch.  Belching caused almost complete resolution of her discomfort.  She also took an ASA at the time, just in case it was cardiac in nature.  She felt like the pill stuck in her throat, but went back to bed, returned her CPAP to its original settings, and slept for 7 hours without interruption.  Upon awakining her pain was basically gone.  Throughout the day today she felt the pain on occasion and eventually decided to come in to be seen for her sake of mind.  Currently the pain is right sided, located in the upper chest, and feels more like an ache than anything else.  It is not relieved by anything, is slightly exacerbated by inspiration, and does not have any radiation.  She has no personal history of heart disease, and does have a history of GERD and and ulcer for which she takes famotidine.  Habits & Providers  Alcohol-Tobacco-Diet     Tobacco Status: quit     Tobacco Counseling: to quit use of tobacco products     Year Quit: 1979     Pack years: 25 pack year history  Problems Prior to Update: 1)  Peripheral Neuropathy   (ICD-356.9) 2)  Low Back Pain  (ICD-724.2) 3)  Pulmonary Nodule, Solitary  (ICD-518.89) 4)  Need Prophylactic Vaccination&inoculation Flu  (ICD-V04.81) 5)  Special Screening For Osteoporosis  (ICD-V82.81) 6)  Irritable Bowel Syndrome  (ICD-564.1) 7)  Rotator Cuff Syndrome, Left  (ICD-726.10) 8)  Chest Pain, Atypical  (ICD-786.59) 9)  Vertigo Nos or Dizziness  (ICD-780.4) 10)  Rhinitis, Allergic  (ICD-477.9) 11)  Reflux Esophagitis  (ICD-530.11) 12)  Psoriasis  (ICD-696.1) 13)  Peptic Ulcer Dis., Unspec. w/o Obstruction  (ICD-533.90) 14)  Osteoarthritis of Spine, Nos  (ICD-721.90) 15)  Migraine, Unspec., w/o Intractable Migraine  (ICD-346.90) 16)  Hypertriglyceridemia  (ICD-272.1) 17)  Hypertension, Benign Systemic  (ICD-401.1) 18)  Hypercholesterolemia  (ICD-272.0) 19)  Hemorrhoids, Nos  (ICD-455.6) 20)  Eczema, Atopic Dermatitis  (ICD-691.8) 21)  Diabetes Mellitus II, Uncomplicated  (ICD-250.00) 22)  Depressive Disorder, Nos  (ICD-311) 23)  Deep Vein Thrombophlebitis, Leg  (ICD-451.19) 24)  Carpal Tunnel Syndrome  (ICD-354.0) 25)  Asthma, Persistent, Moderate  (ICD-493.90)  Medications Prior to Update: 1)  Albuterol 90 Mcg/act Aers (Albuterol) .... Inhale 2 Puff Using Inhaler Every Four Hours 2)  Aspirin Childrens 81 Mg Chew (Aspirin) .... Take 1 Tablet By Mouth Once A Day 3)  Atenolol 100 Mg Tabs (Atenolol) .... Take 1/2 Tablet By Mouth Daily 4)  Cyclobenzaprine Hcl 10 Mg Tabs (Cyclobenzaprine Hcl) .... Take 1 Tablet By Mouth Every Night 5)  Enalapril Maleate 10 Mg Tabs (Enalapril Maleate) .... Take 1/2  Tablet By Mouth Once A Day 6)  Flovent Hfa 220 Mcg/act Aero (Fluticasone Propionate  Hfa) .... Inhale 1 Puff As Directed Twice A Day 7)  Loratadine 10 Mg Tabs (Loratadine) .Marland Kitchen.. 1 Tablet By Mouth Once A Day 8)  Simvastatin 20 Mg Tabs (Simvastatin) .... Take 1 Tablet By Mouth Every Night 9)  Triamcinolone Acetonide 0.5 %  Crea (Triamcinolone Acetonide) .... Apply To Affected Areas  Bid 10)  Nortriptyline Hcl 50 Mg Caps (Nortriptyline Hcl) .... One By Mouth Two Times A Day 11)  Freestyle Lancets   Misc (Lancets) .... Use As Directed 12)  Freestyle Lite   Strp (Glucose Blood) .... Use As Directed 13)  Centrum   Tabs (Multiple Vitamins-Minerals) .Marland Kitchen.. 1 Tablet By Mouth Once Daily 14)  Famotidine 40 Mg  Tabs (Famotidine) .... One By Mouth Daily 15)  Metformin Hcl 500 Mg Tabs (Metformin Hcl) .... One By Mouth Two Times A Day 16)  Topamax 25 Mg Tabs (Topiramate) .... Two Pills By Mouth At Bedtime 17)  Baclofen 10 Mg Tabs (Baclofen) .... As Needed For [facility]ache [facility] 18)  Lidoderm 5 % Ptch (Lidocaine) .... Per [facility] 19)  Tramadol Hcl 50 Mg  Tabs (Tramadol Hcl) .... One By Mouth Q6h As Needed Back Pain 20)  Antivert 25 Mg Tabs (Meclizine Hcl) .... One By Mouth Q6h As Needed Dizziness 21)  Tessalon 200 Mg Caps (Benzonatate) .... One By Mouth Q6h As Needed Cough 22)  Align  Caps (Probiotic Product) .... Per Otc Instructions 23)  Diclofenac Potassium 50 Mg Tabs (Diclofenac Potassium) .... One By Mouth Q8h As Needed Pain.  No More Than 2 Days Per Week. 24)  Metaxalone 800 Mg Tabs (Metaxalone) .... One By Mouth Qid As Needed Migraine Per [facility].  Allergies: 1)  ! Hydrocodone-Acetaminophen (Hydrocodone-Acetaminophen) 2)  Amoxicillin (Amoxicillin) 3)  Azithromycin (Azithromycin) 4)  Clindamycin Hcl (Clindamycin Hcl) 5)  Codeine Phosphate (Codeine Phosphate) 6)  Demerol (Meperidine Hcl) 7)  Etodolac (Etodolac) 8)  Imitrex (Sumatriptan) 9)  Naprosyn (Naproxen) 10)  Neurontin (Gabapentin) 11)  Sulfamethoxazole (Sulfamethoxazole) 12)  Talwin Nx  Review of Systems       The patient complains of chest pain.  The patient denies fever, vision loss, hoarseness, syncope, dyspnea on exertion, peripheral edema, prolonged cough, headaches, hemoptysis, abdominal pain, melena, hematochezia, and severe indigestion/heartburn.    Physical Exam  General:   Well-developed,well-nourished,in no acute distress; alert,appropriate and cooperative throughout examination Neck:  No deformities, masses, or tenderness noted. Chest Wall:  No deformities, masses, or tenderness noted. Lungs:  Normal respiratory effort, chest expands symmetrically. Lungs are clear to auscultation, no crackles or wheezes. Heart:  Normal rate and regular rhythm. S1 and S2 normal without gallop, murmur, click, rub or other extra sounds. Abdomen:  Bowel sounds positive,abdomen soft and non-tender without masses, organomegaly or hernias noted.   Impression & Recommendations:  Problem # 1:  CHEST PAIN, ATYPICAL (ICD-786.59) Pt history is not immediately concerning for a cardiac cause of her chest pain.  It would appear that the onset of her pain is directly related to her CPAP machine settings change, and the patient has had a history of a previous episode of similar pain after another setting change.  This, coupled with no personal  history of cardiac disease, a reassuring EKG, and a history of reflux, would give a low suspicion for a cardiac causes of her pain.  Pt was advised to try taking omeprazole, as this would likely be more beneficial than famotadine if these problems are indeed related to her reflux, leave her CPAP on its previous settings, and to call our emergency hotline if any questions arrise over the weekend.  She was informed that a physician from the practice is always reachable and that, if she is worried about a recurrence of her chest pain over the weekend, she is welcome to call them or come to the ED.  Orders: EKG- [facility] (EKG) [facility]- Est Level  3 (57846)  Complete Medication List: 1)  Albuterol 90 Mcg/act Aers (Albuterol) .... Inhale 2 puff using inhaler every four hours 2)  Aspirin Childrens 81 Mg Chew (Aspirin) .... Take 1 tablet by mouth once a day 3)  Atenolol 100 Mg Tabs (Atenolol) .... Take 1/2 tablet by mouth daily 4)  Cyclobenzaprine Hcl 10 Mg Tabs  (Cyclobenzaprine hcl) .... Take 1 tablet by mouth every night 5)  Enalapril Maleate 10 Mg Tabs (Enalapril maleate) .... Take 1/2  tablet by mouth once a day 6)  Flovent Hfa 220 Mcg/act Aero (Fluticasone propionate  hfa) .... Inhale 1 puff as directed twice a day 7)  Loratadine 10 Mg Tabs (Loratadine) .Marland Kitchen.. 1 tablet by mouth once a day 8)  Simvastatin 20 Mg Tabs (Simvastatin) .... Take 1 tablet by mouth every night 9)  Triamcinolone Acetonide 0.5 % Crea (Triamcinolone acetonide) .... Apply to affected areas bid 10)  Nortriptyline Hcl 50 Mg Caps (Nortriptyline hcl) .... One by mouth two times a day 11)  Freestyle Lancets Misc (Lancets) .... Use as directed 12)  Freestyle Lite Strp (Glucose blood) .... Use as directed 13)  Centrum Tabs (Multiple vitamins-minerals) .Marland Kitchen.. 1 tablet by mouth once daily 14)  Famotidine 40 Mg Tabs (Famotidine) .... One by mouth daily 15)  Metformin Hcl 500 Mg Tabs (Metformin hcl) .... One by mouth two times a day 16)  Topamax 25 Mg Tabs (Topiramate) .... Two pills by mouth at bedtime 17)  Baclofen 10 Mg Tabs (Baclofen) .... As needed for headache per headache center 18)  Lidoderm 5 % Ptch (Lidocaine) .... Per headache center 19)  Tramadol Hcl 50 Mg Tabs (Tramadol hcl) .... One by mouth q6h as needed back pain 20)  Antivert 25 Mg Tabs (Meclizine hcl) .... One by mouth q6h as needed dizziness 21)  Tessalon 200 Mg Caps (Benzonatate) .... One by mouth q6h as needed cough 22)  Align Caps (Probiotic product) .... Per otc instructions 23)  Diclofenac Potassium 50 Mg Tabs (Diclofenac potassium) .... One by mouth q8h as needed pain.  no more than 2 days per week. 24)  Metaxalone 800 Mg Tabs (Metaxalone) .... One by mouth qid as needed migraine per headache center. 25)  Omeprazole 40 Mg Cpdr (Omeprazole) .... Take one every day.  stop taking it if you have problems with headaches.  Patient Instructions: 1)  Stop taking your famotadine and start taking omeprazole.  If you have  problems with headaches you can always switch back.  This medicine should help with your reflux. 2)  If you have worsening chest pain over the weekend, call our clinic to speak with the emergency hotline or call 911. 3)  Follow up with Dr [name] at your scheduled time in a week and a half. Prescriptions: OMEPRAZOLE 40 MG CPDR (OMEPRAZOLE) Take one  every day.  Stop taking it if you have problems with headaches.  #14 x 0   Entered and Authorized by:   Majel Homer MD   Signed by:   Majel Homer MD on 05/10/2010   Method used:   Electronically to        [facility]* (retail)       [address] [place], [place]  [address]       Ph: [phone]       Fax: [phone]   RxID:   [id number]

## 2010-09-10 NOTE — Assessment & Plan Note (Signed)
Summary: f/u,df   Vital Signs:  Patient profile:   70 year old female Weight:      183.9 pounds Pulse rate:   79 / minute BP sitting:   119 / 72  (right arm)  Vitals Entered By: Dedra Skeens CMA, (March 23, 2009 9:23 AM) CC: f/up HTN and DM Is Patient Diabetic? Yes  Pain Assessment Patient in pain? yes     Location: r shoulder Intensity: 9   Primary Care Provider:  Doralee Albino MD  CC:  f/up HTN and DM.  History of Present Illness: Rt shoulder pain.  Actually subscapular with a pleuritic componant.  No shortness of breath.  No fever.  No risk factors for PE.  Does have hx of pleurisy.  BP has been running quite well  BS generally below 150.  Due for an A1C  Due for lipid panel  Habits & Providers  Alcohol-Tobacco-Diet     Tobacco Status: never  Current Medications (verified): 1)  Albuterol 90 Mcg/act Aers (Albuterol) .... Inhale 2 Puff Using Inhaler Every Four Hours 2)  Aspirin Childrens 81 Mg Chew (Aspirin) .... Take 1 Tablet By Mouth Once A Day 3)  Atenolol 100 Mg Tabs (Atenolol) .... Take 1 Tablet By Mouth Once A Day 4)  Benzonatate 200 Mg Caps (Benzonatate) .... Take 1 Capsule By Mouth Twice A Day 5)  Cyclobenzaprine Hcl 10 Mg Tabs (Cyclobenzaprine Hcl) .... Take 1 Tablet By Mouth Every Night 6)  Enalapril Maleate 10 Mg Tabs (Enalapril Maleate) .... Take 1/2  Tablet By Mouth Once A Day 7)  Flovent Hfa 220 Mcg/act Aero (Fluticasone Propionate  Hfa) .... Inhale 1 Puff As Directed Twice A Day 8)  Hydrochlorothiazide 12.5 Mg Caps (Hydrochlorothiazide) .Marland Kitchen.. 1 Capsule By Mouth Once A Day 9)  Loratadine 10 Mg Tabs (Loratadine) .Marland Kitchen.. 1 Tablet By Mouth Once A Day 10)  Meclizine Hcl 25 Mg Tabs (Meclizine Hcl) .... Take 1 Tablet By Mouth Every Six Hours As Needed 11)  Potassium Chloride Cr 10 Meq Tbcr (Potassium Chloride) .... Take 1 Cpasule By Mouth Once A Day 12)  Simvastatin 20 Mg Tabs (Simvastatin) .... Take 1 Tablet By Mouth Every Night 13)  Triamcinolone Acetonide  0.5 %  Crea (Triamcinolone Acetonide) .... Apply To Affected Areas Bid 14)  Nortriptyline Hcl 50 Mg Caps (Nortriptyline Hcl) .... One By Mouth Qhs 15)  Freestyle Lancets   Misc (Lancets) .... Use As Directed 16)  Freestyle Lite   Strp (Glucose Blood) .... Use As Directed 17)  Miralax   Powd (Polyethylene Glycol 3350) .Marland Kitchen.. 17g By Mouth Once Daily As Needed Constipation Disp Qs For One Month Supply 18)  Centrum   Tabs (Multiple Vitamins-Minerals) .Marland Kitchen.. 1 Tablet By Mouth Once Daily 19)  Famotidine 40 Mg  Tabs (Famotidine) .... One By Mouth Daily 20)  Metformin Hcl 500 Mg Tabs (Metformin Hcl) .... One Twice Daily 21)  Topamax 25 Mg Tabs (Topiramate) .... Two Pills By Mouth At Bedtime 22)  Baclofen 10 Mg Tabs (Baclofen) .... As Needed For Headache Per [facility] 23)  Lidoderm 5 % Ptch (Lidocaine) .... Per [facility]  Allergies (verified): 1)  ! Hydrocodone-Acetaminophen (Hydrocodone-Acetaminophen) 2)  Amoxicillin (Amoxicillin) 3)  Azithromycin (Azithromycin) 4)  Clindamycin Hcl (Clindamycin Hcl) 5)  Codeine Phosphate (Codeine Phosphate) 6)  Demerol (Meperidine Hcl) 7)  Etodolac (Etodolac) 8)  Imitrex (Sumatriptan) 9)  Naprosyn (Naproxen) 10)  Neurontin (Gabapentin) 11)  Sulfamethoxazole (Sulfamethoxazole) 12)  Talwin Nx  Past History:  Past medical, surgical, family and social  histories (including risk factors) reviewed, and no changes noted (except as noted below).  Past Medical History: Reviewed history from 10/08/2006 and no changes required. Also takes butal/APA/caff for migraine., carbafed DM prn cough, fatty liver by ultrasound 3/07, HNP of cervical and lumbar spine, subdural hematoma from fall 1/05  Past Surgical History: Reviewed history from 10/08/2006 and no changes required. Appendectomy -, Barium Enema -, Bone Density 4/99 T=-0.4 -, breast surgery -, CT - spine -, CXR -, ECG -, Hysterectomy still has Rt. Ovary -, PFTs nl - 09/12/1999  Family History: Reviewed  history from 10/08/2006 and no changes required. - DM, ETOHism, + Ca, HBP, CAD, CVA, depression,  Social History: Reviewed history from 10/08/2006 and no changes required. disabled 2nd to DJD; non smoker; non drinker; low fat diet; exercise limited due to back pain  Physical Exam  General:  Well-developed,well-nourished,in no acute distress; alert,appropriate and cooperative throughout examination Neck:  No deformities, masses, or tenderness noted. Lungs:  Normal respiratory effort, chest expands symmetrically. Lungs are clear to auscultation, no crackles or wheezes. Heart:  Normal rate and regular rhythm. S1 and S2 normal without gallop, murmur, click, rub or other extra sounds. Msk:  Pain not reproducable on palpation.  Located deep to mid scapula.  Diabetes Management Exam:    Foot Exam (with socks and/or shoes not present):       Sensory-Monofilament:          Left foot: normal          Right foot: normal   Impression & Recommendations:  Problem # 1:  CHEST PAIN, ATYPICAL (ICD-786.59) CXR shows possible new 7mm pulmonary nodule.  CT ordered and patient notified. Orders: CXR- 2view (CXR) CT without Contrast (CT w/o contrast) [facility]- Est  Level 4 (19147)  Problem # 2:  DIABETES MELLITUS II, UNCOMPLICATED (ICD-250.00)  Her updated medication list for this problem includes:    Aspirin Childrens 81 Mg Chew (Aspirin) .Marland Kitchen... Take 1 tablet by mouth once a day    Enalapril Maleate 10 Mg Tabs (Enalapril maleate) .Marland Kitchen... Take 1/2  tablet by mouth once a day    Metformin Hcl 500 Mg Tabs (Metformin hcl) ..... One twice daily  Orders: A1C-[facility] (82956) [facility]- Est  Level 4 (21308)  Problem # 3:  HYPERCHOLESTEROLEMIA (ICD-272.0)  Her updated medication list for this problem includes:    Simvastatin 20 Mg Tabs (Simvastatin) .Marland Kitchen... Take 1 tablet by mouth every night  Orders: Lipid-[facility] ([id number]) [facility]- Est  Level 4 (52841)  Problem # 4:  HYPERTENSION, BENIGN SYSTEMIC (ICD-401.1)  Her  updated medication list for this problem includes:    Atenolol 100 Mg Tabs (Atenolol) .Marland Kitchen... Take 1 tablet by mouth once a day    Enalapril Maleate 10 Mg Tabs (Enalapril maleate) .Marland Kitchen... Take 1/2  tablet by mouth once a day    Hydrochlorothiazide 12.5 Mg Caps (Hydrochlorothiazide) .Marland Kitchen... 1 capsule by mouth once a day  Orders: Basic Met-[facility] (32440-10272) [facility]- Est  Level 4 (53664)  Complete Medication List: 1)  Albuterol 90 Mcg/act Aers (Albuterol) .... Inhale 2 puff using inhaler every four hours 2)  Aspirin Childrens 81 Mg Chew (Aspirin) .... Take 1 tablet by mouth once a day 3)  Atenolol 100 Mg Tabs (Atenolol) .... Take 1 tablet by mouth once a day 4)  Benzonatate 200 Mg Caps (Benzonatate) .... Take 1 capsule by mouth twice a day 5)  Cyclobenzaprine Hcl 10 Mg Tabs (Cyclobenzaprine hcl) .... Take 1 tablet by mouth every night 6)  Enalapril Maleate  10 Mg Tabs (Enalapril maleate) .... Take 1/2  tablet by mouth once a day 7)  Flovent Hfa 220 Mcg/act Aero (Fluticasone propionate  hfa) .... Inhale 1 puff as directed twice a day 8)  Hydrochlorothiazide 12.5 Mg Caps (Hydrochlorothiazide) .Marland Kitchen.. 1 capsule by mouth once a day 9)  Loratadine 10 Mg Tabs (Loratadine) .Marland Kitchen.. 1 tablet by mouth once a day 10)  Meclizine Hcl 25 Mg Tabs (Meclizine hcl) .... Take 1 tablet by mouth every six hours as needed 11)  Potassium Chloride Cr 10 Meq Tbcr (Potassium chloride) .... Take 1 cpasule by mouth once a day 12)  Simvastatin 20 Mg Tabs (Simvastatin) .... Take 1 tablet by mouth every night 13)  Triamcinolone Acetonide 0.5 % Crea (Triamcinolone acetonide) .... Apply to affected areas bid 14)  Nortriptyline Hcl 50 Mg Caps (Nortriptyline hcl) .... One by mouth qhs 15)  Freestyle Lancets Misc (Lancets) .... Use as directed 16)  Freestyle Lite Strp (Glucose blood) .... Use as directed 17)  Miralax Powd (Polyethylene glycol 3350) .Marland Kitchen.. 17g by mouth once daily as needed constipation disp qs for one month supply 18)  Centrum  Tabs (Multiple vitamins-minerals) .Marland Kitchen.. 1 tablet by mouth once daily 19)  Famotidine 40 Mg Tabs (Famotidine) .... One by mouth daily 20)  Metformin Hcl 500 Mg Tabs (Metformin hcl) .... One twice daily 21)  Topamax 25 Mg Tabs (Topiramate) .... Two pills by mouth at bedtime 22)  Baclofen 10 Mg Tabs (Baclofen) .... As needed for headache per headache center 23)  Lidoderm 5 % Ptch (Lidocaine) .... Per headache center Prescriptions: METFORMIN HCL 500 MG TABS (METFORMIN HCL) one twice daily  #180 x 3   Entered and Authorized by:   Doralee Albino MD   Signed by:   Doralee Albino MD on 03/23/2009   Method used:   Electronically to        [facility]* (retail)       [address] [place], [place]  [address]       Ph: [phone]       Fax: [phone]   RxID:   [id number] CYCLOBENZAPRINE HCL 10 MG TABS (CYCLOBENZAPRINE HCL) Take 1 tablet by mouth every night  #90 x 3   Entered and Authorized by:   Doralee Albino MD   Signed by:   Doralee Albino MD on 03/23/2009   Method used:   Electronically to        [facility]* (retail)       [address] [place], [place]  [address]       Ph: [phone]       Fax: [phone]   RxID:   [id number] SIMVASTATIN 20 MG TABS (SIMVASTATIN) Take 1 tablet by mouth every night  #90 x 3   Entered and Authorized by:   Doralee Albino MD   Signed by:   Doralee Albino MD on 03/23/2009   Method used:   Electronically to        [facility]* (retail)       [address] [place], [place]  [address]       Ph: [phone]       Fax: [phone]   RxID:   [id number]    Prevention & Chronic Care Immunizations  Influenza vaccine: given  (05/19/2008)   Influenza vaccine due: 05/19/2009    Tetanus booster: 11/12/2007: given   Tetanus booster due: 11/11/2017    Pneumococcal vaccine: Pneumovax (Medicare)  (04/26/2007)   Pneumococcal vaccine due:  None    H. zoster vaccine: 06/07/2007: Zostavax  Colorectal Screening   Hemoccult: Done.  (06/12/2003)   Hemoccult due: Not Indicated    Colonoscopy: Done.  (01/10/2004)   Colonoscopy due: 01/09/2014  Other Screening   Pap smear: Done.  (08/11/1997)   Pap smear due: Not Indicated    Mammogram: normal  (10/24/2008)   Mammogram due: 10/24/2009    DXA bone density scan: normal  (01/04/2008)   DXA scan due: 01/03/2013    Smoking status: never  (03/23/2009)  Diabetes Mellitus   HgbA1C: 6.2  (03/23/2009)   Hemoglobin A1C due: 10/10/2007    Eye exam: No diabetic retinopathy.  Per patient   (08/11/2008)    Foot exam: yes  (03/23/2009)   Foot exam action/deferral: Do today   High risk foot: No  (03/23/2009)   Foot care education: Not documented   Foot exam due: 12/25/2007    Urine microalbumin/creatinine ratio: Not documented   Urine microalbumin action/deferral: Not indicated    Diabetes flowsheet reviewed?: Yes   Progress toward A1C goal: At goal  Lipids   Total Cholesterol: 144  (01/28/2008)   LDL: 54  (01/28/2008)   LDL Direct: Not documented   HDL: 53  (01/28/2008)   Triglycerides: 186  (01/28/2008)    SGOT (AST): 25  (11/01/2008)   SGPT (ALT): 17  (11/01/2008)   Alkaline phosphatase: 97  (11/01/2008)   Total bilirubin: 0.4  (11/01/2008)    Lipid flowsheet reviewed?: Yes   Progress toward LDL goal: At goal  Hypertension   Last Blood Pressure: 119 / 72  (03/23/2009)   Serum creatinine: 0.93  (11/01/2008)   Serum potassium 4.2  (11/01/2008)    Hypertension flowsheet reviewed?: Yes   Progress toward BP goal: At goal  Self-Management Support :    Diabetes self-management support: Written self-care plan  (03/23/2009)   Diabetes care plan printed    Hypertension self-management support: , Written self-care plan  (03/23/2009)   Hypertension self-care plan printed.    Lipid self-management support: Written self-care plan  (03/23/2009)   Lipid self-care  plan printed.   Nursing Instructions: Diabetic foot exam today     Diabetic Eye Exam  Procedure date:  08/11/2008  Findings:      No diabetic retinopathy.  Per patient     Diabetic Eye Exam  Procedure date:  08/11/2008  Findings:      No diabetic retinopathy.  Per patient   Laboratory Results   Blood Tests   Date/Time Received: March 23, 2009 9:28  AM  Date/Time Reported: March 23, 2009 10:03 AM   HGBA1C: 6.2%   (Normal Range: Non-Diabetic - 3-6%   Control Diabetic - 6-8%)  Comments: ...............test performed by......Marland KitchenBonnie A. [place], [place] (ASCP)       Diabetic Foot Exam Foot Inspection Is there a history of a foot ulcer?              No Is there a foot ulcer now?              No Is there swelling or an abnormal foot shape?          No Are the toenails long?                No Are  the toenails thick?                No Are the toenails ingrown?              No Is there heavy callous build-up?              No Is there pain in the calf muscle (Intermittent claudication) when walking?    NoIs there a claw toe deformity?              No Is there elevated skin temperature?            No Is there limited ankle dorsiflexion?            No Is there foot or ankle muscle weakness?            No  Diabetic Foot Care Education Pulse Check          Right Foot          Left Foot Posterior Tibial:        normal            normal Dorsalis Pedis:        normal            normal  High Risk Feet? No   10-g (5.07) Semmes-Weinstein Monofilament Test           Right Foot          Left Foot Visual Inspection               Test Control      normal         normal Site 1         normal         normal Site 2         normal         normal Site 3         normal         normal Site 4         normal         normal Site 5         normal         normal Site 6         normal         normal Site 7         normal         normal Site 8         normal         normal Site 9          normal         normal Site 10         normal         normal  Impression      normal         normal

## 2010-09-10 NOTE — Assessment & Plan Note (Signed)
Summary: f/u,df   Vital Signs:  Patient profile:   70 year old female Weight:      185.1 pounds Temp:     97.9 degrees F oral Pulse rate:   78 / minute Pulse rhythm:   regular BP sitting:   119 / 73  (right arm) Cuff size:   large  Vitals Entered By: Loralee Pacas CMA (July 20, 2009 4:36 PM)  Primary Care Provider:  Doralee Albino MD   History of Present Illness: Had back for one week - now back to normal.  Rxed by chiropracter.  Had spasms and skin sensitivity like shingles but never broke out in rash.  Now back to baseline  DM, excellent control.  A1C today.  Home blood sugars running great.  No hypoglycemia spells  Due for follow up CT scan on asymptomatic pulmonary nodule.     Current Medications (verified): 1)  Albuterol 90 Mcg/act Aers (Albuterol) .... Inhale 2 Puff Using Inhaler Every Four Hours 2)  Aspirin Childrens 81 Mg Chew (Aspirin) .... Take 1 Tablet By Mouth Once A Day 3)  Atenolol 100 Mg Tabs (Atenolol) .... Take 1 Tablet By Mouth Two Times A Day 4)  Cyclobenzaprine Hcl 10 Mg Tabs (Cyclobenzaprine Hcl) .... Take 1 Tablet By Mouth Every Night 5)  Enalapril Maleate 10 Mg Tabs (Enalapril Maleate) .... Take 1  Tablet By Mouth Once A Day 6)  Flovent Hfa 220 Mcg/act Aero (Fluticasone Propionate  Hfa) .... Inhale 1 Puff As Directed Twice A Day 7)  Hydrochlorothiazide 12.5 Mg Caps (Hydrochlorothiazide) .Marland Kitchen.. 1 Capsule By Mouth Once A Day 8)  Loratadine 10 Mg Tabs (Loratadine) .Marland Kitchen.. 1 Tablet By Mouth Once A Day 9)  Potassium Chloride Cr 10 Meq Tbcr (Potassium Chloride) .... Take 1 Cpasule By Mouth Once A Day 10)  Simvastatin 20 Mg Tabs (Simvastatin) .... Take 1 Tablet By Mouth Every Night 11)  Triamcinolone Acetonide 0.5 %  Crea (Triamcinolone Acetonide) .... Apply To Affected Areas Bid 12)  Nortriptyline Hcl 50 Mg Caps (Nortriptyline Hcl) .... One By Mouth Qhs 13)  Freestyle Lancets   Misc (Lancets) .... Use As Directed 14)  Freestyle Lite   Strp (Glucose Blood) ....  Use As Directed 15)  Centrum   Tabs (Multiple Vitamins-Minerals) .Marland Kitchen.. 1 Tablet By Mouth Once Daily 16)  Famotidine 40 Mg  Tabs (Famotidine) .... One By Mouth Daily 17)  Metformin Hcl 500 Mg Tabs (Metformin Hcl) .... One Twice Daily 18)  Topamax 25 Mg Tabs (Topiramate) .... Two Pills By Mouth At Bedtime 19)  Baclofen 10 Mg Tabs (Baclofen) .... As Needed For Headache [facility] 20)  Lidoderm 5 % Ptch (Lidocaine) .... Per [facility] 21)  Cambia 50 Mg Pack (Diclofenac Potassium) .... Per [facility]  Allergies (verified): 1)  ! Hydrocodone-Acetaminophen (Hydrocodone-Acetaminophen) 2)  Amoxicillin (Amoxicillin) 3)  Azithromycin (Azithromycin) 4)  Clindamycin Hcl (Clindamycin Hcl) 5)  Codeine Phosphate (Codeine Phosphate) 6)  Demerol (Meperidine Hcl) 7)  Etodolac (Etodolac) 8)  Imitrex (Sumatriptan) 9)  Naprosyn (Naproxen) 10)  Neurontin (Gabapentin) 11)  Sulfamethoxazole (Sulfamethoxazole) 12)  Talwin Nx  Past History:  Past medical, surgical, family and social histories (including risk factors) reviewed, and no changes noted (except as noted below).  Past Medical History: Reviewed history from 10/08/2006 and no changes required. Also takes butal/APA/caff for migraine., carbafed DM prn cough, fatty liver by ultrasound 3/07, HNP of cervical and lumbar spine, subdural hematoma from fall 1/05  Past Surgical History: Reviewed history from 10/08/2006 and no changes  required. Appendectomy -, Barium Enema -, Bone Density 4/99 T=-0.4 -, breast surgery -, CT - spine -, CXR -, ECG -, Hysterectomy still has Rt. Ovary -, PFTs nl - 09/12/1999  Family History: Reviewed history from 10/08/2006 and no changes required. - DM, ETOHism, + Ca, HBP, CAD, CVA, depression,  Social History: Reviewed history from 10/08/2006 and no changes required. disabled 2nd to DJD; non smoker; non drinker; low fat diet; exercise limited due to back pain  Physical Exam  General:   Well-developed,well-nourished,in no acute distress; alert,appropriate and cooperative throughout examination Lungs:  Normal respiratory effort, chest expands symmetrically. Lungs are clear to auscultation, no crackles or wheezes. Abdomen:  Bowel sounds positive,abdomen soft and non-tender without masses, organomegaly or hernias noted. Msk:  No CVA tenderness.  No skin hypersensitivity at present.  Good ROM of lumbar spine.   Impression & Recommendations:  Problem # 1:  LOW BACK PAIN (ICD-724.2) Assessment Unchanged  Recurrent problem (although this is the first time I have documented)  Flair last week now back to baseline Her updated medication list for this problem includes:    Aspirin Childrens 81 Mg Chew (Aspirin) .Marland Kitchen... Take 1 tablet by mouth once a day    Cyclobenzaprine Hcl 10 Mg Tabs (Cyclobenzaprine hcl) .Marland Kitchen... Take 1 tablet by mouth every night    Baclofen 10 Mg Tabs (Baclofen) .Marland Kitchen... As needed for headache per headache center    Cambia 50 Mg Pack (Diclofenac potassium) .Marland Kitchen... Per headache center  Orders: [facility]- Est  Level 4 (54098)  Problem # 2:  DIABETES MELLITUS II, UNCOMPLICATED (ICD-250.00) Assessment: Improved  At goal Her updated medication list for this problem includes:    Aspirin Childrens 81 Mg Chew (Aspirin) .Marland Kitchen... Take 1 tablet by mouth once a day    Enalapril Maleate 10 Mg Tabs (Enalapril maleate) .Marland Kitchen... Take 1  tablet by mouth once a day    Metformin Hcl 500 Mg Tabs (Metformin hcl) ..... One twice daily  Orders: [facility]- Est  Level 4 (11914)  Problem # 3:  PULMONARY NODULE, SOLITARY (ICD-518.89) test Orders: CT without Contrast (CT w/o contrast) [facility]- Est  Level 4 (78295)  Complete Medication List: 1)  Albuterol 90 Mcg/act Aers (Albuterol) .... Inhale 2 puff using inhaler every four hours 2)  Aspirin Childrens 81 Mg Chew (Aspirin) .... Take 1 tablet by mouth once a day 3)  Atenolol 100 Mg Tabs (Atenolol) .... Take 1 tablet by mouth two times a day 4)   Cyclobenzaprine Hcl 10 Mg Tabs (Cyclobenzaprine hcl) .... Take 1 tablet by mouth every night 5)  Enalapril Maleate 10 Mg Tabs (Enalapril maleate) .... Take 1  tablet by mouth once a day 6)  Flovent Hfa 220 Mcg/act Aero (Fluticasone propionate  hfa) .... Inhale 1 puff as directed twice a day 7)  Hydrochlorothiazide 12.5 Mg Caps (Hydrochlorothiazide) .Marland Kitchen.. 1 capsule by mouth once a day 8)  Loratadine 10 Mg Tabs (Loratadine) .Marland Kitchen.. 1 tablet by mouth once a day 9)  Potassium Chloride Cr 10 Meq Tbcr (Potassium chloride) .... Take 1 cpasule by mouth once a day 10)  Simvastatin 20 Mg Tabs (Simvastatin) .... Take 1 tablet by mouth every night 11)  Triamcinolone Acetonide 0.5 % Crea (Triamcinolone acetonide) .... Apply to affected areas bid 12)  Nortriptyline Hcl 50 Mg Caps (Nortriptyline hcl) .... One by mouth qhs 13)  Freestyle Lancets Misc (Lancets) .... Use as directed 14)  Freestyle Lite Strp (Glucose blood) .... Use as directed 15)  Centrum Tabs (Multiple vitamins-minerals) .Marland KitchenMarland KitchenMarland Kitchen 1  tablet by mouth once daily 16)  Famotidine 40 Mg Tabs (Famotidine) .... One by mouth daily 17)  Metformin Hcl 500 Mg Tabs (Metformin hcl) .... One twice daily 18)  Topamax 25 Mg Tabs (Topiramate) .... Two pills by mouth at bedtime 19)  Baclofen 10 Mg Tabs (Baclofen) .... As needed for headache per headache center 20)  Lidoderm 5 % Ptch (Lidocaine) .... Per headache center 21)  Cambia 50 Mg Pack (Diclofenac potassium) .... Per headache center  Other Orders: A1C-[facility] (56213) Prescriptions: SIMVASTATIN 20 MG TABS (SIMVASTATIN) Take 1 tablet by mouth every night  #90 x 3   Entered and Authorized by:   Doralee Albino MD   Signed by:   Doralee Albino MD on 07/20/2009   Method used:   Electronically to        [facility]* (retail)       [address] [place], [place]  [address]       Ph: [phone]       Fax: [phone]   RxID:   [id number] NORTRIPTYLINE HCL 50 MG CAPS  (NORTRIPTYLINE HCL) one by mouth qhs  #31 x 12   Entered and Authorized by:   Doralee Albino MD   Signed by:   Doralee Albino MD on 07/20/2009   Method used:   Electronically to        [facility]* (retail)       [address] [place], [place]  [address]       Ph: [phone]       Fax: [phone]   RxID:   [id number]   Laboratory Results   Blood Tests   Date/Time Received: July 20, 2009 4:40 PM  Date/Time Reported: July 20, 2009 4:49 PM   HGBA1C: 6.4%   (Normal Range: Non-Diabetic - 3-6%   Control Diabetic - 6-8%)  Comments: ...........test performed by...........Marland KitchenLoralee Pacas, CMA entered by Terese Door, CMA         Prevention & Chronic Care Immunizations   Influenza vaccine: Fluvax MCR  (05/25/2009)   Influenza vaccine due: 04/11/2010    Tetanus booster: 11/12/2007: given   Tetanus booster due: 11/11/2017    Pneumococcal vaccine: Pneumovax (Medicare)  (04/26/2007)   Pneumococcal vaccine due: None    H. zoster vaccine: 06/07/2007: Zostavax  Colorectal Screening   Hemoccult: Done.  (06/12/2003)   Hemoccult due: Not Indicated    Colonoscopy: Done.  (01/10/2004)   Colonoscopy due: 01/09/2014  Other Screening   Pap smear: Done.  (08/11/1997)   Pap smear due: Not Indicated    Mammogram: normal  (10/24/2008)   Mammogram due: 10/24/2009    DXA bone density scan: normal  (01/04/2008)   DXA scan due: 01/03/2013    Smoking status: never  (03/23/2009)  Diabetes Mellitus   HgbA1C: 6.4  (07/20/2009)   Hemoglobin A1C due: 10/10/2007    Eye exam: No diabetic retinopathy.  Per patient   (08/11/2008)    Foot exam: yes  (03/23/2009)   Foot exam action/deferral: Do today   High risk foot: No  (03/23/2009)   Foot care education: Not documented   Foot exam due: 12/25/2007    Urine microalbumin/creatinine ratio: Not documented   Urine microalbumin action/deferral: Not indicated    Diabetes flowsheet  reviewed?: Yes   Progress toward A1C goal: At goal  Lipids   Total  Cholesterol: 153  (03/23/2009)   LDL: 67  (03/23/2009)   LDL Direct: Not documented   HDL: 45  (03/23/2009)   Triglycerides: 205  (03/23/2009)    SGOT (AST): 25  (11/01/2008)   SGPT (ALT): 17  (11/01/2008)   Alkaline phosphatase: 97  (11/01/2008)   Total bilirubin: 0.4  (11/01/2008)    Lipid flowsheet reviewed?: Yes   Progress toward LDL goal: At goal  Hypertension   Last Blood Pressure: 119 / 73  (07/20/2009)   Serum creatinine: 0.89  (03/23/2009)   Serum potassium 4.2  (03/23/2009)    Hypertension flowsheet reviewed?: Yes   Progress toward BP goal: At goal  Self-Management Support :   Personal Goals (by the next clinic visit) :     Personal A1C goal: 7  (07/20/2009)     Personal blood pressure goal: 130/80  (07/20/2009)     Personal LDL goal: 100  (07/20/2009)    Diabetes self-management support: Written self-care plan  (07/20/2009)   Diabetes care plan printed    Hypertension self-management support: Written self-care plan  (07/20/2009)   Hypertension self-care plan printed.    Lipid self-management support: Written self-care plan  (07/20/2009)   Lipid self-care plan printed.

## 2010-09-10 NOTE — Consult Note (Signed)
Summary: Dr. Georga Hacking  Dr. Georga Hacking   Imported By: Denny Peon LEVAN 07/02/2007 10:36:34  _____________________________________________________________________  External Attachment:    Type:   Image     Comment:   External Document

## 2010-09-10 NOTE — Assessment & Plan Note (Signed)
Summary: f/u,df   Vital Signs:  Patient Profile:   70 Years Old Female Height:     59.5 inches Weight:      191 pounds Temp:     97.8 degrees F BP sitting:   138 / 80                Last Flu Vaccine:  Fluvax MCR (05/19/2008 1:53:41 PM) Flu Vaccine Result Date:  05/19/2008 Flu Vaccine Result:  given Last HDL:  53 (01/28/2008 8:41:00 PM) HDL Next Due:  1 yr Last LDL:  54 (01/28/2008 8:41:00 PM) LDL Next Due:  1 yr   PCP:  Doralee Albino MD  Chief Complaint:  fu.  History of Present Illness: DM home blood sugars have been great on metformen 500 mg two times a day  Note that weight loss continues.    Was having a big problem with headaches, but for whatever reason, that has subsided.      Current Allergies: ! HYDROCODONE-ACETAMINOPHEN (HYDROCODONE-ACETAMINOPHEN) AMOXICILLIN (AMOXICILLIN) AZITHROMYCIN (AZITHROMYCIN) CLINDAMYCIN HCL (CLINDAMYCIN HCL) CODEINE PHOSPHATE (CODEINE PHOSPHATE) DEMEROL (MEPERIDINE HCL) ETODOLAC (ETODOLAC) IMITREX (SUMATRIPTAN) NAPROSYN (NAPROXEN) NEURONTIN (GABAPENTIN) SULFAMETHOXAZOLE (SULFAMETHOXAZOLE) TALWIN NX (PENTAZOCINE-NALOXONE)      Physical Exam  General:     Well-developed,well-nourished,in no acute distress; alert,appropriate and cooperative throughout examination Lungs:     Normal respiratory effort, chest expands symmetrically. Lungs are clear to auscultation, no crackles or wheezes. Heart:     Normal rate and regular rhythm. S1 and S2 normal without gallop, murmur, click, rub or other extra sounds.    Impression & Recommendations:  Problem # 1:  HYPERTENSION, BENIGN SYSTEMIC (ICD-401.1) Assessment: Improved  Her updated medication list for this problem includes:    Atenolol 100 Mg Tabs (Atenolol) .Marland Kitchen... Take 1 tablet by mouth twice a day    Enalapril Maleate 10 Mg Tabs (Enalapril maleate) .Marland Kitchen... Take 1 tablet by mouth once a day    Hydrochlorothiazide 12.5 Mg Caps (Hydrochlorothiazide) .Marland Kitchen... 1 capsule by  mouth once a day  Orders: [facility]- Est Level  3 (99213)   Problem # 2:  MIGRAINE, UNSPEC., W/O INTRACTABLE MIGRAINE (ICD-346.90) Assessment: Unchanged Cont current Rx Her updated medication list for this problem includes:    Aspirin Childrens 81 Mg Chew (Aspirin) .Marland Kitchen... Take 1 tablet by mouth once a day    Atenolol 100 Mg Tabs (Atenolol) .Marland Kitchen... Take 1 tablet by mouth twice a day    Propoxyphene N-apap 100-650 Mg Tabs (Propoxyphene n-apap) .Marland Kitchen... Take 1 tablet by mouth five times a day    Tramadol Hcl 50 Mg Tabs (Tramadol hcl) .Marland Kitchen... Take 1 tablet by mouth every six hours  Orders: [facility]- Est Level  3 (16109)   Problem # 3:  DIABETES MELLITUS II, UNCOMPLICATED (ICD-250.00) Improved with weight loss The following medications were removed from the medication list:    Metformin Hcl 500 Mg Tabs (Metformin hcl) .Marland Kitchen... Take 1 tablet by mouth twice a day  Her updated medication list for this problem includes:    Aspirin Childrens 81 Mg Chew (Aspirin) .Marland Kitchen... Take 1 tablet by mouth once a day    Enalapril Maleate 10 Mg Tabs (Enalapril maleate) .Marland Kitchen... Take 1 tablet by mouth once a day    Metformin Hcl 500 Mg Tabs (Metformin hcl) ..... One twice daily  Orders: A1C-[facility] (60454) [facility]- Est Level  3 (09811)   Complete Medication List: 1)  Albuterol 90 Mcg/act Aers (Albuterol) .... Inhale 2 puff using inhaler every four hours 2)  Aspirin Childrens 81 Mg Chew (Aspirin) .Marland KitchenMarland KitchenMarland Kitchen  Take 1 tablet by mouth once a day 3)  Atenolol 100 Mg Tabs (Atenolol) .... Take 1 tablet by mouth twice a day 4)  Benzonatate 200 Mg Caps (Benzonatate) .... Take 1 capsule by mouth twice a day 5)  Cyclobenzaprine Hcl 10 Mg Tabs (Cyclobenzaprine hcl) .... Take 1 tablet by mouth every night 6)  Enalapril Maleate 10 Mg Tabs (Enalapril maleate) .... Take 1 tablet by mouth once a day 7)  Flovent Hfa 220 Mcg/act Aero (Fluticasone propionate  hfa) .... Inhale 1 puff as directed twice a day 8)  Hydrochlorothiazide 12.5 Mg Caps  (Hydrochlorothiazide) .Marland Kitchen.. 1 capsule by mouth once a day 9)  Loratadine 10 Mg Tabs (Loratadine) .Marland Kitchen.. 1 tablet by mouth once a day 10)  Meclizine Hcl 25 Mg Tabs (Meclizine hcl) .... Take 1 tablet by mouth every six hours as needed 11)  Potassium Chloride Cr 10 Meq Tbcr (Potassium chloride) .... Take 1 cpasule by mouth once a day 12)  Propoxyphene N-apap 100-650 Mg Tabs (Propoxyphene n-apap) .... Take 1 tablet by mouth five times a day 13)  Simvastatin 20 Mg Tabs (Simvastatin) .... Take 1 tablet by mouth every night 14)  Tramadol Hcl 50 Mg Tabs (Tramadol hcl) .... Take 1 tablet by mouth every six hours 15)  Triamcinolone Acetonide 0.5 % Crea (Triamcinolone acetonide) .... Apply to affected areas bid 16)  Nortriptyline Hcl 75 Mg Caps (Nortriptyline hcl) .... One by mouth at bedtime 17)  Cipro 500 Mg Tabs (Ciprofloxacin hcl) .... One by mouth two times a day 18)  Freestyle Lancets Misc (Lancets) .... Use as directed 19)  Freestyle Lite Strp (Glucose blood) .... Use as directed 20)  Miralax Powd (Polyethylene glycol 3350) .Marland Kitchen.. 17g by mouth once daily as needed constipation disp qs for one month supply 21)  Centrum Tabs (Multiple vitamins-minerals) .Marland Kitchen.. 1 tablet by mouth once daily 22)  Famotidine 40 Mg Tabs (Famotidine) .... One by mouth daily 23)  Metformin Hcl 500 Mg Tabs (Metformin hcl) .... One twice daily   Patient Instructions: 1)  Please schedule a follow-up appointment in 3 months.   Prescriptions: NORTRIPTYLINE HCL 75 MG CAPS (NORTRIPTYLINE HCL) one by mouth at bedtime  #90 x 3   Entered and Authorized by:   Doralee Albino MD   Signed by:   Doralee Albino MD on 07/21/2008   Method used:   Electronically to        [facility]* (retail)       [address] [place], [place]  [address]       Ph: [phone]       Fax: [phone]   RxID:   [id number] HYDROCHLOROTHIAZIDE 12.5 MG CAPS (HYDROCHLOROTHIAZIDE) 1 capsule by mouth once a day  #90 x 3    Entered and Authorized by:   Doralee Albino MD   Signed by:   Doralee Albino MD on 07/21/2008   Method used:   Electronically to        [facility]* (retail)       [address] [place], [place]  [address]       Ph: [phone]       Fax: [phone]   RxID:   [id number] SIMVASTATIN 20 MG TABS (SIMVASTATIN) Take 1 tablet by mouth every night  #90 x 3   Entered and Authorized by:  Doralee Albino MD   Signed by:   Doralee Albino MD on 07/21/2008   Method used:   Electronically to        [facility]* (retail)       [address] [place], [place]  [address]       Ph: [phone]       Fax: [phone]   RxID:   [id number] CYCLOBENZAPRINE HCL 10 MG TABS (CYCLOBENZAPRINE HCL) Take 1 tablet by mouth every night  #90 x 3   Entered and Authorized by:   Doralee Albino MD   Signed by:   Doralee Albino MD on 07/21/2008   Method used:   Electronically to        [facility]* (retail)       [address] [place], [place]  [address]       Ph: [phone]       Fax: [phone]   RxID:   [id number]  ] Laboratory Results   Blood Tests   Date/Time Received: July 21, 2008 3:00  PM  Date/Time Reported: July 21, 2008 3:17 PM   HGBA1C: 6.1%   (Normal Range: Non-Diabetic - 3-6%   Control Diabetic - 6-8%)  Comments: ..........test performed by...........Marland KitchenLillia Pauls, CMA .............entered by...........Marland KitchenBonnie A. [place], [place] (ASCP)

## 2010-09-10 NOTE — Progress Notes (Signed)
Summary: needs prior  Phone Note Call from Patient Call back at Home Phone [phone]   Caller: Patient Summary of Call: needs a needle BX and needs a PA to have this done - Dr [name] aware BX is sched for Mar 2 Initial call taken by: De Nurse,  October 01, 2009 1:41 PM  Follow-up for Phone Call        Called [facility] out of office but they stated [name] would take care of that when she returns to office, pt notified, instructed to call [facility] day or two before test sched to happen, voiced understanding Follow-up by: [name],  October 01, 2009 3:42 PM

## 2010-09-10 NOTE — Progress Notes (Signed)
  Phone Note Other Incoming   Caller: [employer] Reason for Call: Confirm/change Appt Summary of Call: [name] with [facility] called regarding a preauth that was approved for [name]'s CT.  Apparently [employer] called and complained about a delay in receiving an authorization.  Informed [name] that the approval had been given for visit.  If there are any further questions or concerns regarding this matter, you can cal her direct ext [phone]). Initial call taken by: Abundio Miu  Follow-up for Phone Call        fyi to pcp Follow-up by: [name],  September 11, 2009 2:58 PM  Additional Follow-up for Phone Call Additional follow up Details #1::        noted.  I was the one who complained.  See phone note 12/21. Additional Follow-up by: Doralee Albino MD,  September 11, 2009 5:08 PM

## 2010-09-10 NOTE — Assessment & Plan Note (Signed)
Summary: F/U DM/FLU SHOT/AIC CHECK/[facility]   Vital Signs:  Patient profile:   70 year old female Weight:      192.7 pounds Temp:     98.3 degrees F Pulse rate:   83 / minute BP sitting:   111 / 75  Vitals Entered By: Loralee Pacas CMA (May 22, 2010 1:47 PM)  Primary Care Provider:  Doralee Albino MD   History of Present Illness: Recheck chronic med problems, DM, Chol, and HBP Asymptomatic. Continues to work on diet.  Has hit a plateau. Med rec done.  Needs a couple of refills, sent.  Current Medications (verified): 1)  Albuterol 90 Mcg/act Aers (Albuterol) .... Inhale 2 Puff Using Inhaler Every Four Hours 2)  Aspirin Childrens 81 Mg Chew (Aspirin) .... Take 1 Tablet By Mouth Once A Day 3)  Atenolol 100 Mg Tabs (Atenolol) .... Take 1/2 Tablet By Mouth Daily 4)  Cyclobenzaprine Hcl 10 Mg Tabs (Cyclobenzaprine Hcl) .... Take 1 Tablet By Mouth Every Night 5)  Enalapril Maleate 10 Mg Tabs (Enalapril Maleate) .... Take 1/2  Tablet By Mouth Once A Day 6)  Flovent Hfa 220 Mcg/act Aero (Fluticasone Propionate  Hfa) .... Inhale 1 Puff As Directed Twice A Day 7)  Loratadine 10 Mg Tabs (Loratadine) .Marland Kitchen.. 1 Tablet By Mouth Once A Day 8)  Simvastatin 20 Mg Tabs (Simvastatin) .... Take 1 Tablet By Mouth Every Night 9)  Triamcinolone Acetonide 0.5 %  Crea (Triamcinolone Acetonide) .... Apply To Affected Areas Bid 10)  Nortriptyline Hcl 50 Mg Caps (Nortriptyline Hcl) .... One By Mouth Two Times A Day 11)  Freestyle Lancets   Misc (Lancets) .... Use As Directed 12)  Freestyle Lite   Strp (Glucose Blood) .... Use As Directed 13)  Centrum   Tabs (Multiple Vitamins-Minerals) .Marland Kitchen.. 1 Tablet By Mouth Once Daily 14)  Famotidine 40 Mg  Tabs (Famotidine) .... One By Mouth Daily 15)  Metformin Hcl 500 Mg Tabs (Metformin Hcl) .... One By Mouth Two Times A Day 16)  Topamax 25 Mg Tabs (Topiramate) .... Two Pills By Mouth At Bedtime 17)  Baclofen 10 Mg Tabs (Baclofen) .... As Needed For Headache Per [facility] 18)  Lidoderm 5 % Ptch (Lidocaine) .... Per [facility] 19)  Tramadol Hcl 50 Mg  Tabs (Tramadol Hcl) .... One By Mouth Q6h As Needed Back Pain 20)  Antivert 25 Mg Tabs (Meclizine Hcl) .... One By Mouth Q6h As Needed Dizziness 21)  Tessalon 200 Mg Caps (Benzonatate) .... One By Mouth Q6h As Needed Cough 22)  Align  Caps (Probiotic Product) .... Per [facility] Instructions 23)  Diclofenac Potassium 50 Mg Tabs (Diclofenac Potassium) .... One By Mouth Q8h As Needed Pain.  No More Than 2 Days Per Week. 24)  Metaxalone 800 Mg Tabs (Metaxalone) .... One By Mouth Qid As Needed Migraine Per [facility]. 25)  Omeprazole 40 Mg Cpdr (Omeprazole) .... Take One Every Day.  Stop Taking It If You Have Problems With Headaches. 26)  Cambia 50 Mg Pack (Diclofenac Potassium) .... Per [facility]  Allergies (verified): 1)  ! Hydrocodone-Acetaminophen (Hydrocodone-Acetaminophen) 2)  Amoxicillin (Amoxicillin) 3)  Azithromycin (Azithromycin) 4)  Clindamycin Hcl (Clindamycin Hcl) 5)  Codeine Phosphate (Codeine Phosphate) 6)  Demerol (Meperidine Hcl) 7)  Etodolac (Etodolac) 8)  Imitrex (Sumatriptan) 9)  Naprosyn (Naproxen) 10)  Neurontin (Gabapentin) 11)  Sulfamethoxazole (Sulfamethoxazole) 12)  Talwin Nx  Past History:  Past medical, surgical, family and social histories (including risk factors) reviewed, and no changes noted (except as  noted below).  Past Medical History: Also takes butal/APA/caff for migraine., carbafed DM prn cough, fatty liver by ultrasound 3/07, HNP of cervical and lumbar spine, subdural hematoma from fall 1/05 Has nasal CPAP for sleep apnea  Past Surgical History: Reviewed history from 10/08/2006 and no changes required. Appendectomy -, Barium Enema -, Bone Density 4/99 T=-0.4 -, breast surgery -, CT - spine -, CXR -, ECG -, Hysterectomy still has Rt. Ovary -, PFTs nl - 09/12/1999  Family History: Reviewed history from 10/08/2006 and no changes required. - DM, ETOHism,  + Ca, HBP, CAD, CVA, depression,  Social History: Reviewed history from 10/08/2006 and no changes required. disabled 2nd to DJD; non smoker; non drinker; low fat diet; exercise limited due to back pain  Physical Exam  General:  Well-developed,well-nourished,in no acute distress; alert,appropriate and cooperative throughout examination Lungs:  Normal respiratory effort, chest expands symmetrically. Lungs are clear to auscultation, no crackles or wheezes. Heart:  Normal rate and regular rhythm. S1 and S2 normal without gallop, murmur, click, rub or other extra sounds.   Impression & Recommendations:  Problem # 1:  HYPERTENSION, BENIGN SYSTEMIC (ICD-401.1) Assessment Improved  Her updated medication list for this problem includes:    Atenolol 100 Mg Tabs (Atenolol) .Marland Kitchen... Take 1/2 tablet by mouth daily    Enalapril Maleate 10 Mg Tabs (Enalapril maleate) .Marland Kitchen... Take 1/2  tablet by mouth once a day  BP today: 111/75 Prior BP: 138/83 (05/10/2010)  Labs Reviewed: K+: 4.5 (10/19/2009) Creat: : 0.97 (10/19/2009)   Chol: 153 (03/23/2009)   HDL: 45 (03/23/2009)   LDL: 67 (03/23/2009)   TG: 205 (03/23/2009)  Orders: [facility]- Est  Level 4 (99214)  Problem # 2:  HYPERCHOLESTEROLEMIA (ICD-272.0) Assessment: Unchanged  needs check Her updated medication list for this problem includes:    Simvastatin 20 Mg Tabs (Simvastatin) .Marland Kitchen... Take 1 tablet by mouth every night  Orders: T-Lipid Profile [phone]) Comp Met-[facility] [phone]) [facility]- Est  Level 4 (84696)  Labs Reviewed: SGOT: 31 (10/19/2009)   SGPT: 22 (10/19/2009)   HDL:45 (03/23/2009), 53 (01/28/2008)  LDL:67 (03/23/2009), 54 (01/28/2008)  Chol:153 (03/23/2009), 144 (01/28/2008)  Trig:205 (03/23/2009), 186 (01/28/2008)  Problem # 3:  DIABETES MELLITUS II, UNCOMPLICATED (ICD-250.00) Assessment: Improved At goal Her updated medication list for this problem includes:    Aspirin Childrens 81 Mg Chew (Aspirin) .Marland Kitchen... Take 1 tablet by mouth  once a day    Enalapril Maleate 10 Mg Tabs (Enalapril maleate) .Marland Kitchen... Take 1/2  tablet by mouth once a day    Metformin Hcl 500 Mg Tabs (Metformin hcl) ..... One by mouth two times a day  Orders: A1C-[facility] (29528) [facility]- Est  Level 4 (41324)  Complete Medication List: 1)  Albuterol 90 Mcg/act Aers (Albuterol) .... Inhale 2 puff using inhaler every four hours 2)  Aspirin Childrens 81 Mg Chew (Aspirin) .... Take 1 tablet by mouth once a day 3)  Atenolol 100 Mg Tabs (Atenolol) .... Take 1/2 tablet by mouth daily 4)  Cyclobenzaprine Hcl 10 Mg Tabs (Cyclobenzaprine hcl) .... Take 1 tablet by mouth every night 5)  Enalapril Maleate 10 Mg Tabs (Enalapril maleate) .... Take 1/2  tablet by mouth once a day 6)  Flovent Hfa 220 Mcg/act Aero (Fluticasone propionate  hfa) .... Inhale 1 puff as directed twice a day 7)  Loratadine 10 Mg Tabs (Loratadine) .Marland Kitchen.. 1 tablet by mouth once a day 8)  Simvastatin 20 Mg Tabs (Simvastatin) .... Take 1 tablet by mouth every night 9)  Triamcinolone Acetonide 0.5 %  Crea (Triamcinolone acetonide) .... Apply to affected areas bid 10)  Nortriptyline Hcl 50 Mg Caps (Nortriptyline hcl) .... One by mouth two times a day 11)  Freestyle Lancets Misc (Lancets) .... Use as directed 12)  Freestyle Lite Strp (Glucose blood) .... Use as directed 13)  Centrum Tabs (Multiple vitamins-minerals) .Marland Kitchen.. 1 tablet by mouth once daily 14)  Famotidine 40 Mg Tabs (Famotidine) .... One by mouth daily 15)  Metformin Hcl 500 Mg Tabs (Metformin hcl) .... One by mouth two times a day 16)  Topamax 25 Mg Tabs (Topiramate) .... Two pills by mouth at bedtime 17)  Baclofen 10 Mg Tabs (Baclofen) .... As needed for headache per headache center 18)  Lidoderm 5 % Ptch (Lidocaine) .... Per headache center 19)  Tramadol Hcl 50 Mg Tabs (Tramadol hcl) .... One by mouth q6h as needed back pain 20)  Antivert 25 Mg Tabs (Meclizine hcl) .... One by mouth q6h as needed dizziness 21)  Tessalon 200 Mg Caps (Benzonatate)  .... One by mouth q6h as needed cough 22)  Align Caps (Probiotic product) .... Per otc instructions 23)  Diclofenac Potassium 50 Mg Tabs (Diclofenac potassium) .... One by mouth q8h as needed pain.  no more than 2 days per week. 24)  Metaxalone 800 Mg Tabs (Metaxalone) .... One by mouth qid as needed migraine per headache center. 25)  Omeprazole 40 Mg Cpdr (Omeprazole) .... Take one every day.  stop taking it if you have problems with headaches. 26)  Cambia 50 Mg Pack (Diclofenac potassium) .... Per headache center  Other Orders: Influenza Vaccine MCR (09811)  Patient Instructions: 1)  Things look good. 2)  I will call with blood work. 3)  Keep working on diet. Prescriptions: TESSALON 200 MG CAPS (BENZONATATE) one by mouth q6h as needed cough  #30 x 2   Entered and Authorized by:   Doralee Albino MD   Signed by:   Doralee Albino MD on 05/23/2010   Method used:   Electronically to        [facility]* (retail)       [address] [place], [place]  [address]       Ph: [phone]       Fax: [phone]   RxID:   [id number] FLOVENT HFA 220 MCG/ACT AERO (FLUTICASONE PROPIONATE  HFA) Inhale 1 puff as directed twice a day  #1 x 12   Entered and Authorized by:   Doralee Albino MD   Signed by:   Doralee Albino MD on 05/23/2010   Method used:   Electronically to        [facility]* (retail)       [address] [place], [place]  [address]       Ph: [phone]       Fax: [phone]   RxID:   [id number]   Laboratory Results   Blood Tests   Date/Time Received: May 22, 2010 1:54  PM  Date/Time Reported: May 22, 2010 2:08 PM   HGBA1C: 6.1%   (Normal Range: Non-Diabetic - 3-6%   Control Diabetic - 6-8%)  Comments: ...............test performed by......Marland KitchenBonnie A. [place], MLS (ASCP)cm      Prevention & Chronic Care Immunizations   Influenza vaccine: Fluvax MCR  (05/22/2010)   Influenza  vaccine due: 04/11/2010    Tetanus booster: 11/12/2007: given  Tetanus booster due: 11/11/2017    Pneumococcal vaccine: Pneumovax (Medicare)  (04/26/2007)   Pneumococcal vaccine due: None    H. zoster vaccine: 06/07/2007: Zostavax  Colorectal Screening   Hemoccult: Done.  (06/12/2003)   Hemoccult due: Not Indicated    Colonoscopy: Done.  (01/10/2004)   Colonoscopy due: 01/09/2014  Other Screening   Pap smear: Done.  (08/11/1997)   Pap smear due: Not Indicated    Mammogram: ASSESSMENT: Negative - BI-RADS 1^MM DIGITAL SCREENING  (10/25/2009)   Mammogram due: 10/26/2010    DXA bone density scan: normal  (01/04/2008)   DXA scan due: 01/03/2013    Smoking status: quit  (05/10/2010)  Diabetes Mellitus   HgbA1C: 6.1  (05/22/2010)   Hemoglobin A1C due: 10/10/2007    Eye exam: No diabetic retinopathy.  Per patient   (08/11/2008)    Foot exam: yes  (02/06/2010)   Foot exam action/deferral: Do today   High risk foot: No  (03/23/2009)   Foot care education: Not documented   Foot exam due: 12/25/2007    Urine microalbumin/creatinine ratio: Not documented   Urine microalbumin action/deferral: Not indicated    Diabetes flowsheet reviewed?: Yes   Progress toward A1C goal: At goal  Lipids   Total Cholesterol: 153  (03/23/2009)   Lipid panel action/deferral: Lipid Panel ordered   LDL: 67  (03/23/2009)   LDL Direct: Not documented   HDL: 45  (03/23/2009)   Triglycerides: 205  (03/23/2009)    SGOT (AST): 31  (10/19/2009)   SGPT (ALT): 22  (10/19/2009) CMP ordered    Alkaline phosphatase: 98  (10/19/2009)   Total bilirubin: 0.3  (10/19/2009)    Lipid flowsheet reviewed?: Yes   Progress toward LDL goal: At goal  Hypertension   Last Blood Pressure: 111 / 75  (05/22/2010)   Serum creatinine: 0.97  (10/19/2009)   Serum potassium 4.5  (10/19/2009) CMP ordered     Hypertension flowsheet reviewed?: Yes   Progress toward BP goal: At goal  Self-Management Support :    Personal Goals (by the next clinic visit) :     Personal A1C goal: 7  (07/20/2009)     Personal blood pressure goal: 130/80  (07/20/2009)     Personal LDL goal: 100  (07/20/2009)    Diabetes self-management support: Written self-care plan  (10/19/2009)    Hypertension self-management support: Written self-care plan  (10/19/2009)    Hypertension self-management support not done because: Good outcomes  (11/21/2009)    Lipid self-management support: Written self-care plan  (10/19/2009)     Immunizations Administered:  Influenza Vaccine # 1:    Vaccine Type: Fluvax MCR    Site: right deltoid    Mfr: GlaxoSmithKline    Dose: 0.5 ml    Route: IM    Given by: Loralee Pacas CMA    Exp. Date: 02/05/2011    Lot #: [id number]    VIS given: 03/05/10 version given May 22, 2010.  Flu Vaccine Consent Questions:    Do you have a history of severe allergic reactions to this vaccine? no    Any prior history of allergic reactions to egg and/or gelatin? no    Do you have a sensitivity to the preservative Thimersol? no    Do you have a past history of Guillan-Barre Syndrome? no    Do you currently have an acute febrile illness? no    Have you ever had a severe reaction to latex? no    Vaccine information given and explained to patient? yes  Are you currently pregnant? no

## 2010-09-10 NOTE — Letter (Signed)
Summary: *Referral Letter  [facility]  [address]   [place], [place] [address]   Phone: [phone]  Fax: [phone]    09/04/2008  Thank you in advance for agreeing to see my patient:  Shanavia Makela [address] [place], [place]  [address]  Phone: [phone]  Reason for Referral: Worsening headaches  Patient I inherited on complicated pain medicine regimine for multiple complaints.  She has shown no evidence of medication abuse.  Recently, headache pattern has worsened.  I suspect she has a component of analgesic overuse headache.  Please see, evaluate and recommend changes in treatment.  Current Medical Problems: 1)  NEED PROPHYLACTIC VACCINATION&INOCULATION FLU (ICD-V04.81) 2)  SPECIAL SCREENING FOR OSTEOPOROSIS (ICD-V82.81) 3)  IRRITABLE BOWEL SYNDROME (ICD-564.1) 4)  ROTATOR CUFF SYNDROME, LEFT (ICD-726.10) 5)  CHEST PAIN, ATYPICAL (ICD-786.59) 6)  VERTIGO NOS OR DIZZINESS (ICD-780.4) 7)  RHINITIS, ALLERGIC (ICD-477.9) 8)  REFLUX ESOPHAGITIS (ICD-530.11) 9)  PSORIASIS (ICD-696.1) 10)  PEPTIC ULCER DIS., UNSPEC. W/O OBSTRUCTION (ICD-533.90) 11)  OSTEOARTHRITIS OF SPINE, NOS (ICD-721.90) 12)  MIGRAINE, UNSPEC., W/O INTRACTABLE MIGRAINE (ICD-346.90) 13)  HYPERTRIGLYCERIDEMIA (ICD-272.1) 14)  HYPERTENSION, BENIGN SYSTEMIC (ICD-401.1) 15)  HYPERCHOLESTEROLEMIA (ICD-272.0) 16)  HEMORRHOIDS, NOS (ICD-455.6) 17)  ECZEMA, ATOPIC DERMATITIS (ICD-691.8) 18)  DIABETES MELLITUS II, UNCOMPLICATED (ICD-250.00) 19)  DEPRESSIVE DISORDER, NOS (ICD-311) 20)  DEEP VEIN THROMBOPHLEBITIS, LEG (ICD-451.19) 21)  CARPAL TUNNEL SYNDROME (ICD-354.0) 22)  ASTHMA, UNSPECIFIED (ICD-493.90)   Current Medications: 1)  ALBUTEROL 90 MCG/ACT AERS (ALBUTEROL) Inhale 2 puff using inhaler every four hours 2)  ASPIRIN CHILDRENS 81 MG CHEW (ASPIRIN) Take 1 tablet by mouth once a day 3)  ATENOLOL 100 MG TABS (ATENOLOL) Take 1 tablet by mouth twice a day 4)  BENZONATATE  200 MG CAPS (BENZONATATE) Take 1 capsule by mouth twice a day 5)  CYCLOBENZAPRINE HCL 10 MG TABS (CYCLOBENZAPRINE HCL) Take 1 tablet by mouth every night 6)  ENALAPRIL MALEATE 10 MG TABS (ENALAPRIL MALEATE) Take 1 tablet by mouth once a day 7)  FLOVENT HFA 220 MCG/ACT AERO (FLUTICASONE PROPIONATE  HFA) Inhale 1 puff as directed twice a day 8)  HYDROCHLOROTHIAZIDE 12.5 MG CAPS (HYDROCHLOROTHIAZIDE) 1 capsule by mouth once a day 9)  LORATADINE 10 MG TABS (LORATADINE) 1 tablet by mouth once a day 10)  MECLIZINE HCL 25 MG TABS (MECLIZINE HCL) Take 1 tablet by mouth every six hours as needed 11)  POTASSIUM CHLORIDE CR 10 MEQ TBCR (POTASSIUM CHLORIDE) Take 1 cpasule by mouth once a day 12)  PROPOXYPHENE N-APAP 100-650 MG TABS (PROPOXYPHENE N-APAP) Take 1 tablet by mouth five times a day 13)  SIMVASTATIN 20 MG TABS (SIMVASTATIN) Take 1 tablet by mouth every night 14)  TRAMADOL HCL 50 MG TABS (TRAMADOL HCL) Take 1 tablet by mouth every six hours 15)  TRIAMCINOLONE ACETONIDE 0.5 %  CREA (TRIAMCINOLONE ACETONIDE) apply to affected areas bid 16)  NORTRIPTYLINE HCL 50 MG CAPS (NORTRIPTYLINE HCL) one by mouth qhs 17)  CIPRO 500 MG  TABS (CIPROFLOXACIN HCL) one by mouth two times a day 18)  FREESTYLE LANCETS   MISC (LANCETS) use as directed 19)  FREESTYLE LITE   STRP (GLUCOSE BLOOD) use as directed 20)  MIRALAX   POWD (POLYETHYLENE GLYCOL 3350) 17g by mouth once daily as needed constipation Disp qs for one month supply 21)  CENTRUM   TABS (MULTIPLE VITAMINS-MINERALS) 1 tablet by mouth once daily 22)  FAMOTIDINE 40 MG  TABS (FAMOTIDINE) one by mouth daily 23)  METFORMIN HCL 500 MG TABS (  METFORMIN HCL) one twice daily   Past Medical History: 1)  Also takes butal/APA/caff for migraine., carbafed DM prn cough, fatty liver by ultrasound 3/07, HNP of cervical and lumbar spine, subdural hematoma from fall 1/05    Thank you again for agreeing to see our patient; please contact us if you have any further  questions or need additional information.  Sincerely,  Doralee Albino MD  Appended Document: *Referral Letter pt [facility] app[facility] 09[facility] at 10am. called & made sure she knew. stated they already called her

## 2010-09-10 NOTE — Progress Notes (Signed)
Summary: Rx  Phone Note Refill Request Message from:  Patient  Refills Requested: Medication #1:  NORTRIPTYLINE HCL 75 MG CAPS one by mouth at bedtime pharmacy never received  Initial call taken by: Haydee Salter,  April 18, 2008 10:37 AM  Follow-up for Phone Call        spoke with pharmacist at [facility] and he states Rx is there . patient notified. Follow-up by: Theresia Lo RN,  April 18, 2008 4:32 PM

## 2010-09-10 NOTE — Consult Note (Signed)
[facility]   Imported By: Haydee Salter 09/21/2008 08:21:33  _____________________________________________________________________  External Attachment:    Type:   Image     Comment:   External Document

## 2010-09-10 NOTE — Progress Notes (Signed)
Summary: Rx  Phone Note Call from Patient Call back at Home Phone [phone]   Summary of Call: Is wanting to discuss metformin. Initial call taken by: Haydee Salter,  January 31, 2008 10:02 AM  Follow-up for Phone Call        Pt is concerned that er bg is very high. She was instructed to d/c her metformin for two weeks. BG 124,139 Advised pt that this is not a very high number. She will continue to monitor her bg and call if it is 150 or higher. Follow-up by: Alphia Kava,  January 31, 2008 10:40 AM

## 2010-09-10 NOTE — Assessment & Plan Note (Signed)
Summary: FLU SHOT WP  Nurse Visit    Prior Medications: ALBUTEROL 90 MCG/ACT AERS (ALBUTEROL) Inhale 2 puff using inhaler every four hours ASPIRIN CHILDRENS 81 MG CHEW (ASPIRIN) Take 1 tablet by mouth once a day ATENOLOL 100 MG TABS (ATENOLOL) Take 1 tablet by mouth twice a day BENZONATATE 200 MG CAPS (BENZONATATE) Take 1 capsule by mouth twice a day CYCLOBENZAPRINE HCL 10 MG TABS (CYCLOBENZAPRINE HCL) Take 1 tablet by mouth every night ENALAPRIL MALEATE 10 MG TABS (ENALAPRIL MALEATE) Take 1 tablet by mouth once a day FLOVENT HFA 220 MCG/ACT AERO (FLUTICASONE PROPIONATE  HFA) Inhale 1 puff as directed twice a day HYDROCHLOROTHIAZIDE 12.5 MG CAPS (HYDROCHLOROTHIAZIDE) 1 capsule by mouth once a day LORATADINE 10 MG TABS (LORATADINE) 1 tablet by mouth once a day MECLIZINE HCL 25 MG TABS (MECLIZINE HCL) Take 1 tablet by mouth every six hours as needed METFORMIN HCL 500 MG TABS (METFORMIN HCL) Take 1 tablet by mouth twice a day POTASSIUM CHLORIDE CR 10 MEQ TBCR (POTASSIUM CHLORIDE) Take 1 cpasule by mouth once a day PROPOXYPHENE N-APAP 100-650 MG TABS (PROPOXYPHENE N-APAP) Take 1 tablet by mouth five times a day SIMVASTATIN 20 MG TABS (SIMVASTATIN) Take 1 tablet by mouth every night TRAMADOL HCL 50 MG TABS (TRAMADOL HCL) Take 1 tablet by mouth every six hours TRIAMCINOLONE ACETONIDE 0.5 %  CREA (TRIAMCINOLONE ACETONIDE) apply to affected areas bid NORTRIPTYLINE HCL 75 MG CAPS (NORTRIPTYLINE HCL) one by mouth at bedtime CIPRO 500 MG  TABS (CIPROFLOXACIN HCL) one by mouth two times a day FREESTYLE LANCETS   MISC (LANCETS) use as directed FREESTYLE LITE   STRP (GLUCOSE BLOOD) use as directed MIRALAX   POWD (POLYETHYLENE GLYCOL 3350) 17g by mouth once daily as needed constipation Disp qs for one month supply CENTRUM   TABS (MULTIPLE VITAMINS-MINERALS) 1 tablet by mouth once daily FAMOTIDINE 40 MG  TABS (FAMOTIDINE) one by mouth daily METFORMIN HCL 500 MG TABS (METFORMIN HCL) one twice daily  Current Allergies: ! HYDROCODONE-ACETAMINOPHEN (HYDROCODONE-ACETAMINOPHEN) AMOXICILLIN (AMOXICILLIN) AZITHROMYCIN (AZITHROMYCIN) CLINDAMYCIN HCL (CLINDAMYCIN HCL) CODEINE PHOSPHATE (CODEINE PHOSPHATE) DEMEROL (MEPERIDINE HCL) ETODOLAC (ETODOLAC) IMITREX (SUMATRIPTAN) NAPROSYN (NAPROXEN) NEURONTIN (GABAPENTIN) SULFAMETHOXAZOLE (SULFAMETHOXAZOLE) TALWIN NX (PENTAZOCINE-NALOXONE)   Influenza Vaccine    Vaccine Type: Fluvax MCR    Site: right deltoid    Mfr: GlaxoSmithKline    Dose: 0.5 ml    Route: IM    Given by: Dennison Nancy RN    Exp. Date: 02/07/2009    Lot #: [id number]    VIS given: 03/04/07 version given May 19, 2008.  Flu Vaccine Consent Questions    Do you have a history of severe allergic reactions to this vaccine? no    Any prior history of allergic reactions to egg and/or gelatin? no    Do you have a sensitivity to the preservative Thimersol? no    Do you have a past history of Guillan-Barre Syndrome? no    Do you currently have an acute febrile illness? no    Have you ever had a severe reaction to latex? no    Vaccine information given and explained to patient? yes    Are you currently pregnant? no   Orders Added: 1)  Influenza Vaccine MCR [00025] 2)  Est Level 1- [facility] [09811] 3)  Flu Vaccine & Administration [place]ew    ]

## 2010-09-10 NOTE — Miscellaneous (Signed)
  Clinical Lists Changes  Medications: Added new medication of FREESTYLE LANCETS   MISC (LANCETS) use as directed - Signed Added new medication of FREESTYLE LITE   STRP (GLUCOSE BLOOD) use as directed - Signed Rx of FREESTYLE LANCETS   MISC (LANCETS) use as directed;  #100 x 12;  Signed;  Entered by: Doralee Albino MD;  Authorized by: Doralee Albino MD;  Method used: Historical Rx of FREESTYLE LITE   STRP (GLUCOSE BLOOD) use as directed;  #100 x 12;  Signed;  Entered by: Doralee Albino MD;  Authorized by: Doralee Albino MD;  Method used: Historical    Prescriptions: FREESTYLE LITE   STRP (GLUCOSE BLOOD) use as directed  #100 x 12   Entered and Authorized by:   Doralee Albino MD   Signed by:   Doralee Albino MD on 07/28/2007   Method used:   Historical   RxID:   [id number] FREESTYLE LANCETS   MISC (LANCETS) use as directed  #100 x 12   Entered and Authorized by:   Doralee Albino MD   Signed by:   Doralee Albino MD on 07/28/2007   Method used:   Historical   RxID:   [id number]

## 2010-09-10 NOTE — Progress Notes (Signed)
Summary: Ins Deniel  Phone Note Other Incoming Call back at Home Phone [phone] Call back at [phone]   Caller: [facility] Summary of Call: About deniel for CT Chest w/o contrast.  Next step is appeal.   Initial call taken by: Clydell Hakim,  July 31, 2009 4:40 PM  Follow-up for Phone Call        pt called and insuance is changing the 1st of the year and needs to talk to [facility] Follow-up by: De Nurse,  August 08, 2009 10:26 AM  Additional Follow-up for Phone Call Additional follow up Details #1::        spoke with Ms. Dercole and she gave me her new ins information [employer] complete secre [employer] Health plan# [phone] Mem ID# [id number] Group #[id number] Payer ID# [id number] ph#[phone] Additional Follow-up by: Loralee Pacas CMA,  August 09, 2009 9:18 AM     Appended Document: Ins Deniel

## 2010-09-10 NOTE — Assessment & Plan Note (Signed)
Summary: to see Dr. Iam Lipson/ACM   Vital Signs:  Patient Profile:   70 Years Old Female Height:     59.5 inches Weight:      193.1 pounds Temp:     98.6 degrees F Pulse rate:   67 / minute BP sitting:   135 / 91  Pt. in pain?   yes    Location:   left shoulder    Intensity:   10+  Vitals Entered By: Altamese Dilling CMA, (July 21, 2007 11:18 AM)                  PCP:  Doralee Albino MD  Chief Complaint:  left shoulder pain .  History of Present Illness: Lt shoulder pain worse despite exercises.  Troublesome because she is left handed  Current Allergies: AMOXICILLIN (AMOXICILLIN) AZITHROMYCIN (AZITHROMYCIN) CLINDAMYCIN HCL (CLINDAMYCIN HCL) CODEINE PHOSPHATE (CODEINE PHOSPHATE) DEMEROL (MEPERIDINE HCL) ETODOLAC (ETODOLAC) IMITREX (SUMATRIPTAN) NAPROSYN (NAPROXEN) NEURONTIN (GABAPENTIN) SULFAMETHOXAZOLE (SULFAMETHOXAZOLE) TALWIN NX (PENTAZOCINE-NALOXONE)      Physical Exam  Msk:     Sterile prep, Lt shoulder injected with 2cc 1% Xylocaine annd 1cc 40 mg Kenalog.  Fair immediate relief   Shoulder/Elbow Exam  Skin:    Intact, no scars, lesions, rashes, cafe au lait spots or bruising.    Inspection:    Inspection is normal.    Palpation:    cretitus on motion  Vascular:    Radial, ulnar, brachial, and axillary pulses 2+ and symmetric; capillary refill less than 2 seconds; no evidence of ischemia, clubbing, or cyanosis.      Impression & Recommendations:  Problem # 1:  ROTATOR CUFF SYNDROME, LEFT (ICD-726.10) Injected.  Continue other therapies Orders: [facility] [place]- Est Level  3 (60454) Injection, large joint- [facility] (20610)   Complete Medication List: 1)  Albuterol 90 Mcg/act Aers (Albuterol) .... Inhale 2 puff using inhaler every four hours 2)  Aspirin Childrens 81 Mg Chew (Aspirin) .... Take 1 tablet by mouth once a day 3)  Atenolol 100 Mg Tabs (Atenolol) .... Take 1 tablet by mouth twice a day 4)  Benzonatate 200 Mg Caps (Benzonatate) ....  Take 1 capsule by mouth twice a day 5)  Cimetidine 400 Mg Tabs (Cimetidine) .... Take 1 tablet by mouth twice a day 6)  Cyclobenzaprine Hcl 10 Mg Tabs (Cyclobenzaprine hcl) .... Take 1 tablet by mouth every night 7)  Enalapril Maleate 10 Mg Tabs (Enalapril maleate) .... Take 1 tablet by mouth once a day 8)  Flovent Hfa 220 Mcg/act Aero (Fluticasone propionate  hfa) .... Inhale 1 puff as directed twice a day 9)  Hydrochlorothiazide 12.5 Mg Caps (Hydrochlorothiazide) .Marland Kitchen.. 1 capsule by mouth once a day 10)  Loratadine 10 Mg Tabs (Loratadine) .Marland Kitchen.. 1 tablet by mouth once a day 11)  Meclizine Hcl 25 Mg Tabs (Meclizine hcl) .... Take 1 tablet by mouth every six hours 12)  Metformin Hcl 500 Mg Tabs (Metformin hcl) .... Take 1 tablet by mouth twice a day 13)  Potassium Chloride Cr 10 Meq Tbcr (Potassium chloride) .... Take 1 tablet by mouth once a day 14)  Propoxyphene N-apap 100-650 Mg Tabs (Propoxyphene n-apap) .... Take 1 tablet by mouth five times a day 15)  Simvastatin 20 Mg Tabs (Simvastatin) .... Take 1 tablet by mouth every night 16)  Tramadol Hcl 50 Mg Tabs (Tramadol hcl) .... Take 1 tablet by mouth every six hours 17)  Triamcinolone Acetonide 0.5 % Crea (Triamcinolone acetonide) .... Apply to affected areas bid 18)  Nortriptyline Hcl 50  Mg Caps (Nortriptyline hcl) .... One by mouth at bedtime 19)  Cipro 500 Mg Tabs (Ciprofloxacin hcl) .... One by mouth two times a day     ]

## 2010-09-10 NOTE — Progress Notes (Signed)
Summary: Billing issue   Phone Note Call from Patient Call back at Home Phone [phone]   Summary of Call: Pt got bill for going to diabetic classes at the hospital that we had referred her to, therefore she called the billing department and they told her she would need to give [place] a call. Initial call taken by: Haydee Salter,  April 21, 2007 2:23 PM  Follow-up for Phone Call        Pt states she is being charged for diabetes classes that we referred her to because weight loss was checked on the referral instead of diabetes per her insurance company.  Phone call to Patient accounting at [facility] - they states pt's insurance paid all but $56 of a $340 bill, and changing the diagnosis on pt's previous referral would probably not permit 100% coverage.  However, per pt's request filled out another referral listing her diagnosis of diabetes instead of weight loss - referral faxed to diabetes center.   Follow-up by: [name] MARTIN RN,  April 21, 2007 5:16 PM

## 2010-09-10 NOTE — Miscellaneous (Signed)
Summary: cardiac cath  Clinical Lists Changes  Observations: Added new observation of RESULTS MISC: Type of Report:  Cardiac cath Clean coronary arteries and normal ejection fraction (06/04/2007 16:56)       MISC. Report  Procedure date:  06/04/2007  Findings:      Type of Report:  Cardiac cath Clean coronary arteries and normal ejection fraction   MISC. Report  Procedure date:  06/04/2007  Findings:      Type of Report:  Cardiac cath Clean coronary arteries and normal ejection fraction

## 2010-09-10 NOTE — Assessment & Plan Note (Signed)
Summary: diabetes wk   Vital Signs:  Patient Profile:   70 Years Old Female Height:     59.5 inches Weight:      191 pounds Temp:     98.6 degrees F Pulse rate:   68 / minute BP sitting:   119 / 76  (right arm)  Vitals Entered By: Theresia Lo RN (Dec 25, 2006 2:38 PM)             Is Patient Diabetic? Yes    Chief Complaint:  f/u diabetes.  History of Present Illness: DM HgbA1c today is great at 5.9.  Has had a few episodes of low blood sugar  Headaches are markedly improved - perhaps due to chiropractic treatment.  Whatever the reason, she is taking much less meds - only OTC meds at present  LFT abnormalities have resolved as of last visit.  Will not do blood work today.       Risk Factors:  Tobacco use:  quit    Physical Exam  General:     Well-developed,well-nourished,in no acute distress; alert,appropriate and cooperative throughout examination Head:     Normocephalic and atraumatic without obvious abnormalities. No apparent alopecia or balding. Eyes:     No corneal or conjunctival inflammation noted. EOMI. Perrla. Funduscopic exam benign, without hemorrhages, exudates or papilledema. Vision grossly normal. Ears:     External ear exam shows no significant lesions or deformities.  Otoscopic examination reveals clear canals, tympanic membranes are intact bilaterally without bulging, retraction, inflammation or discharge. Hearing is grossly normal bilaterally. Nose:     External nasal examination shows no deformity or inflammation. Nasal mucosa are pink and moist without lesions or exudates. Mouth:     Oral mucosa and oropharynx without lesions or exudates.  Teeth in good repair. Neck:     No deformities, masses, or tenderness noted. Lungs:     Normal respiratory effort, chest expands symmetrically. Lungs are clear to auscultation, no crackles or wheezes. Heart:     Normal rate and regular rhythm. S1 and S2 normal without gallop, murmur, click, rub or  other extra sounds. Extremities:     No clubbing, cyanosis, edema, or deformity noted with normal full range of motion of all joints.    Diabetes Management Exam:    Foot Exam (with socks and/or shoes not present):       Sensory-Pinprick/Light touch:          Left medial foot (L-4): normal          Left dorsal foot (L-5): normal          Left lateral foot (S-1): normal          Right medial foot (L-4): normal          Right dorsal foot (L-5): normal          Right lateral foot (S-1): normal       Sensory-Monofilament:          Left foot: normal          Right foot: normal       Sensory-other: normal pulses       Inspection:          Left foot: normal          Right foot: normal       Nails:          Left foot: normal          Right foot: normal    Impression & Recommendations:  Problem # 1:  DIABETES MELLITUS II, UNCOMPLICATED (ICD-250.00) Decrease metformen to 500 mg with evening meal Orders: A1C-[facility] (04540) [facility]- Est Level  3 (98119)    Patient Instructions: 1)  Decrease metformen to one tab daily with evening meal 2)  Please schedule a follow-up appointment in 1-2 month.  Laboratory Results   Blood Tests   Date/Time Recieved: Dec 25, 2006 2:32 PM  Date/Time Reported: Dec 25, 2006 2:43 PM   HGBA1C: 5.9%   (Normal Range: Non-Diabetic - 3-6%   Control Diabetic - 6-8%)  Comments: ...................................................................DONNA [facility]  Dec 25, 2006 2:43 PM

## 2010-09-10 NOTE — Miscellaneous (Signed)
Summary: med change  Clinical Lists Changes  Medications: Changed medication from ATENOLOL 100 MG TABS (ATENOLOL) Take 1 tablet by mouth once a day to ATENOLOL 100 MG TABS (ATENOLOL) Take 1 tablet by mouth two times a day

## 2010-09-10 NOTE — Miscellaneous (Signed)
Summary: Asthma clarifcation  Clinical Lists Changes  Problems: Changed problem from ASTHMA, UNSPECIFIED (ICD-493.90) to ASTHMA, PERSISTENT, MODERATE (ICD-493.90) 

## 2010-09-10 NOTE — Miscellaneous (Signed)
  Clinical Lists Changes  Medications: Changed medication from NORTRIPTYLINE HCL 25 MG  CAPS (NORTRIPTYLINE HCL) to NORTRIPTYLINE HCL 25 MG  CAPS (NORTRIPTYLINE HCL) 1 by mouth at bedtime x 1 week then 2 by mouth at bedtime x 1 week then 3 by mouth at bedtime thereafter - Signed Rx of NORTRIPTYLINE HCL 25 MG  CAPS (NORTRIPTYLINE HCL) 1 by mouth at bedtime x 1 week then 2 by mouth at bedtime x 1 week then 3 by mouth at bedtime thereafter;  #90 x 0;  Signed;  Entered by: Doralee Albino MD;  Authorized by: Doralee Albino MD;  Method used: Electronic    Prescriptions: NORTRIPTYLINE HCL 25 MG  CAPS (NORTRIPTYLINE HCL) 1 by mouth at bedtime x 1 week then 2 by mouth at bedtime x 1 week then 3 by mouth at bedtime thereafter  #90 x 0   Entered and Authorized by:   Doralee Albino MD   Signed by:   Doralee Albino MD on 04/27/2007   Method used:   Electronically sent to ...       [facility]*       [address] [place], [place]  [address]       Ph: [phone]       Fax: [phone]   RxID:   [id number]

## 2010-09-10 NOTE — Miscellaneous (Signed)
  Clinical Lists Changes  Medications: Changed medication from ENALAPRIL MALEATE 10 MG TABS (ENALAPRIL MALEATE) Take 1/2  tablet by mouth once a day to ENALAPRIL MALEATE 10 MG TABS (ENALAPRIL MALEATE) Take 1  tablet by mouth once a day

## 2010-09-10 NOTE — Assessment & Plan Note (Signed)
Summary: REMOVAL OF MOLE/ZORSTAVAC/FLU SHOT/[facility]   Vital Signs:  Patient Profile:   70 Years Old Female Height:     59.5 inches Weight:      198 pounds Pulse rate:   77 / minute BP sitting:   119 / 74  Vitals Entered By: Doralee Albino MD (May 24, 2007 2:50 PM)                 Chief Complaint:  removal of skin tags.  History of Present Illness: Wants to wait on zostavax since having some Rt shoulder discomfort now. (early shingles?)  Skin tag removal  Needs renewal of enalapril and simvastatin  Having benefit and side effects with nortriptaline.  Wants to continue, will decide between 50 versus 75 mg dose and call  Flu shot   Current Allergies: AMOXICILLIN (AMOXICILLIN) AZITHROMYCIN (AZITHROMYCIN) CLINDAMYCIN HCL (CLINDAMYCIN HCL) CODEINE PHOSPHATE (CODEINE PHOSPHATE) DEMEROL (MEPERIDINE HCL) ETODOLAC (ETODOLAC) IMITREX (SUMATRIPTAN) NAPROSYN (NAPROXEN) NEURONTIN (GABAPENTIN) SULFAMETHOXAZOLE (SULFAMETHOXAZOLE) TALWIN NX (PENTAZOCINE-NALOXONE)      Physical Exam  General:     Well-developed,well-nourished,in no acute distress; alert,appropriate and cooperative throughout examination Skin:     multiple skin tags, neck and axilla - removed    Impression & Recommendations:  Problem # 1:  SKIN TAG (ICD-701.9) removed Orders: Skin Tags (up to 15) - [facility] (11200)   Complete Medication List: 1)  Albuterol 90 Mcg/act Aers (Albuterol) .... Inhale 2 puff using inhaler every four hours 2)  Aspirin Childrens 81 Mg Chew (Aspirin) .... Take 1 tablet by mouth once a day 3)  Atenolol 100 Mg Tabs (Atenolol) .... Take 1 tablet by mouth twice a day 4)  Benzonatate 200 Mg Caps (Benzonatate) .... Take 1 capsule by mouth twice a day 5)  Cimetidine 400 Mg Tabs (Cimetidine) .... Take 1 tablet by mouth twice a day 6)  Cyclobenzaprine Hcl 10 Mg Tabs (Cyclobenzaprine hcl) .... Take 1 tablet by mouth every night 7)  Enalapril Maleate 10 Mg Tabs (Enalapril maleate) ....  Take 1 tablet by mouth once a day 8)  Flovent Hfa 220 Mcg/act Aero (Fluticasone propionate  hfa) .... Inhale 1 puff as directed twice a day 9)  Hydrochlorothiazide 12.5 Mg Caps (Hydrochlorothiazide) .Marland Kitchen.. 1 capsule by mouth once a day 10)  Loratadine 10 Mg Tabs (Loratadine) .Marland Kitchen.. 1 tablet by mouth once a day 11)  Meclizine Hcl 25 Mg Tabs (Meclizine hcl) .... Take 1 tablet by mouth every six hours 12)  Metformin Hcl 500 Mg Tabs (Metformin hcl) .... Take 1 tablet by mouth twice a day 13)  Potassium Chloride Cr 10 Meq Tbcr (Potassium chloride) .... Take 1 tablet by mouth once a day 14)  Propoxyphene N-apap 100-650 Mg Tabs (Propoxyphene n-apap) .... Take 1 tablet by mouth five times a day 15)  Simvastatin 20 Mg Tabs (Simvastatin) .... Take 1 tablet by mouth every night 16)  Tramadol Hcl 50 Mg Tabs (Tramadol hcl) .... Take 1 tablet by mouth every six hours 17)  Triamcinolone Acetonide 0.5 % Crea (Triamcinolone acetonide) .... Apply to affected areas bid 18)  Nortriptyline Hcl 25 Mg Caps (Nortriptyline hcl) .Marland Kitchen.. 1 by mouth at bedtime x 1 week then 2 by mouth at bedtime x 1 week then 3 by mouth at bedtime thereafter  Other Orders: Flu Vaccine 39yrs + (45409) Admin 1st Vaccine (81191)     Prescriptions: SIMVASTATIN 20 MG TABS (SIMVASTATIN) Take 1 tablet by mouth every night  #90 x 3   Entered and Authorized by:   Doralee Albino MD  Signed by:   Doralee Albino MD on 05/24/2007   Method used:   Electronically sent to ...       [facility]*       [address] [place], [place]  [address]       Ph: [phone]       Fax: [phone]   RxID:   [id number] ENALAPRIL MALEATE 10 MG TABS (ENALAPRIL MALEATE) Take 1 tablet by mouth once a day  #90 x 3   Entered and Authorized by:   Doralee Albino MD   Signed by:   Doralee Albino MD on 05/24/2007   Method used:   Electronically sent to ...       [facility]*       [address] [place], [place]  [address]       Ph: [phone]       Fax: [phone]   RxID:   [id number]  ]  Influenza Vaccine    Vaccine Type: Fluvax 3+    Site: left deltoid    Mfr: [employer]    Dose: 0.5 ml    Route: IM    Given by: neeton moore    Exp. Date: 02/08/2008    Lot #: [id number]    VIS given: 02/07/05 version given May 24, 2007.  Flu Vaccine Consent Questions    Do you have a history of severe allergic reactions to this vaccine? no    Any prior history of allergic reactions to egg and/or gelatin? no    Do you have a sensitivity to the preservative Thimersol? no    Do you have a past history of Guillan-Barre Syndrome? no    Do you currently have an acute febrile illness? no    Have you ever had a severe reaction to latex? no    Vaccine information given and explained to patient? yes    Are you currently pregnant? no

## 2010-09-10 NOTE — Progress Notes (Signed)
Summary: triage  Phone Note Call from Patient Call back at Home Phone [phone]   Reason for Call: Talk to Doctor Summary of Call: pt sts she was advised to call and let MD know how she was doing after receiving a shot in her shoulder, pt sts it seems to be worse. She sts she is having pain in the muslce of her arm. Sts if MD wants to see her to call, not sure if MD can do anything for her. Initial call taken by: ERIN LEVAN,  July 26, 2007 9:41 AM  Follow-up for Phone Call        c/o increasing pain 10/10. using darvocet & biofreeze which has helped a little. message to md to see if she needs to be seen again Follow-up by: [name],  July 26, 2007 9:55 AM  Additional Follow-up for Phone Call Additional follow up Details #1::        Called.  Will get neck and shoulder xrays and order physical therapy Additional Follow-up by: Doralee Albino MD,  July 26, 2007 12:12 PM    Additional Follow-up for Phone Call Additional follow up Details #2::    faxed order to [facility] & told pt she could go today. PT referral form completed & back to md for signature. will fax today Follow-up by: [name],  July 26, 2007 12:20 PM

## 2010-09-10 NOTE — Assessment & Plan Note (Signed)
Summary: f/u,df   Vital Signs:  Patient profile:   70 year old female Height:      59.5 inches Weight:      190.6 pounds BMI:     37.99 Temp:     98.1 degrees F oral Pulse rate:   78 / minute BP sitting:   118 / 76  (left arm) Cuff size:   large  Vitals Entered By: [name] (February 06, 2010 1:37 PM) CC: F/U DM, HTN Is Patient Diabetic? Yes Pain Assessment Patient in pain? no        Primary Care Provider:  Doralee Albino MD  CC:  F/U DM and HTN.  History of Present Illness: Rt. foot dysesthias of three toes.  No other numbers.  Constipation.   Was put on probiotic for diarrhea.  Wt fluctuates daily by about 3 lbs.    Has back pain like the nerve endings hurt like when she had shingles.  Comes and goes.  Severe pain can last for weeks.    On med review, she had mistakenly stopped her Enalapril, not her HCTZ.  I corrected this.  As a diabetic, I want her on HCTZ    Habits & Providers  Alcohol-Tobacco-Diet     Tobacco Status: quit     Tobacco Counseling: to quit use of tobacco products     Year Quit: 1979  Current Medications (verified): 1)  Albuterol 90 Mcg/act Aers (Albuterol) .... Inhale 2 Puff Using Inhaler Every Four Hours 2)  Aspirin Childrens 81 Mg Chew (Aspirin) .... Take 1 Tablet By Mouth Once A Day 3)  Atenolol 100 Mg Tabs (Atenolol) .... Take 1/2 Tablet By Mouth Daily 4)  Cyclobenzaprine Hcl 10 Mg Tabs (Cyclobenzaprine Hcl) .... Take 1 Tablet By Mouth Every Night 5)  Enalapril Maleate 10 Mg Tabs (Enalapril Maleate) .... Take 1/2  Tablet By Mouth Once A Day 6)  Flovent Hfa 220 Mcg/act Aero (Fluticasone Propionate  Hfa) .... Inhale 1 Puff As Directed Twice A Day 7)  Loratadine 10 Mg Tabs (Loratadine) .Marland Kitchen.. 1 Tablet By Mouth Once A Day 8)  Simvastatin 20 Mg Tabs (Simvastatin) .... Take 1 Tablet By Mouth Every Night 9)  Triamcinolone Acetonide 0.5 %  Crea (Triamcinolone Acetonide) .... Apply To Affected Areas Bid 10)  Nortriptyline Hcl 50 Mg Caps  (Nortriptyline Hcl) .... One By Mouth Two Times A Day 11)  Freestyle Lancets   Misc (Lancets) .... Use As Directed 12)  Freestyle Lite   Strp (Glucose Blood) .... Use As Directed 13)  Centrum   Tabs (Multiple Vitamins-Minerals) .Marland Kitchen.. 1 Tablet By Mouth Once Daily 14)  Famotidine 40 Mg  Tabs (Famotidine) .... One By Mouth Daily 15)  Metformin Hcl 500 Mg Tabs (Metformin Hcl) .... One By Mouth Two Times A Day 16)  Topamax 25 Mg Tabs (Topiramate) .... Two Pills By Mouth At Bedtime 17)  Baclofen 10 Mg Tabs (Baclofen) .... As Needed For [facility]ache [facility] 18)  Lidoderm 5 % Ptch (Lidocaine) .... Per [facility] 19)  Tramadol Hcl 50 Mg  Tabs (Tramadol Hcl) .... One By Mouth Q6h As Needed Back Pain 20)  Antivert 25 Mg Tabs (Meclizine Hcl) .... One By Mouth Q6h As Needed Dizziness 21)  Tessalon 200 Mg Caps (Benzonatate) .... One By Mouth Q6h As Needed Cough 22)  Align  Caps (Probiotic Product) .... Per Otc Instructions 23)  Diclofenac Potassium 50 Mg Tabs (Diclofenac Potassium) .... One By Mouth Q8h As Needed Pain.  No More Than 2  Days Per Week. 24)  Metaxalone 800 Mg Tabs (Metaxalone) .... One By Mouth Qid As Needed Migraine Per [facility].  Allergies (verified): 1)  ! Hydrocodone-Acetaminophen (Hydrocodone-Acetaminophen) 2)  Amoxicillin (Amoxicillin) 3)  Azithromycin (Azithromycin) 4)  Clindamycin Hcl (Clindamycin Hcl) 5)  Codeine Phosphate (Codeine Phosphate) 6)  Demerol (Meperidine Hcl) 7)  Etodolac (Etodolac) 8)  Imitrex (Sumatriptan) 9)  Naprosyn (Naproxen) 10)  Neurontin (Gabapentin) 11)  Sulfamethoxazole (Sulfamethoxazole) 12)  Talwin Nx  Past History:  Past medical, surgical, family and social histories (including risk factors) reviewed, and no changes noted (except as noted below).  Past Medical History: Reviewed history from 10/08/2006 and no changes required. Also takes butal/APA/caff for migraine., carbafed DM prn cough, fatty liver by ultrasound 3/07, HNP of  cervical and lumbar spine, subdural hematoma from fall 1/05  Past Surgical History: Reviewed history from 10/08/2006 and no changes required. Appendectomy -, Barium Enema -, Bone Density 4/99 T=-0.4 -, breast surgery -, CT - spine -, CXR -, ECG -, Hysterectomy still has Rt. Ovary -, PFTs nl - 09/12/1999  Family History: Reviewed history from 10/08/2006 and no changes required. - DM, ETOHism, + Ca, HBP, CAD, CVA, depression,  Social History: Reviewed history from 10/08/2006 and no changes required. disabled 2nd to DJD; non smoker; non drinker; low fat diet; exercise limited due to back painSmoking Status:  quit  Physical Exam  General:  Well-developed,well-nourished,in no acute distress; alert,appropriate and cooperative throughout examination Lungs:  Normal respiratory effort, chest expands symmetrically. Lungs are clear to auscultation, no crackles or wheezes. Heart:  Normal rate and regular rhythm. S1 and S2 normal without gallop, murmur, click, rub or other extra sounds.  Diabetes Management Exam:    Foot Exam (with socks and/or shoes not present):       Sensory-Pinprick/Light touch:          Left medial foot (L-4): normal          Left dorsal foot (L-5): normal          Left lateral foot (S-1): normal          Right medial foot (L-4): normal          Right dorsal foot (L-5): normal          Right lateral foot (S-1): normal       Sensory-Monofilament:          Left foot: normal          Right foot: normal       Inspection:          Left foot: normal          Right foot: normal       Nails:          Left foot: normal          Right foot: normal   Impression & Recommendations:  Problem # 1:  HYPERTENSION, BENIGN SYSTEMIC (ICD-401.1) Assessment Unchanged  Stop HCTZ and restart enalapril Her updated medication list for this problem includes:    Atenolol 100 Mg Tabs (Atenolol) .Marland Kitchen... Take 1/2 tablet by mouth daily    Enalapril Maleate 10 Mg Tabs (Enalapril maleate) .Marland Kitchen...  Take 1/2  tablet by mouth once a day  Orders: [facility]- Est  Level 4 (99214)  Problem # 2:  LOW BACK PAIN (ICD-724.2)  Increase nortryptaline Her updated medication list for this problem includes:    Aspirin Childrens 81 Mg Chew (Aspirin) .Marland Kitchen... Take 1 tablet by mouth once a day  Cyclobenzaprine Hcl 10 Mg Tabs (Cyclobenzaprine hcl) .Marland Kitchen... Take 1 tablet by mouth every night    Baclofen 10 Mg Tabs (Baclofen) .Marland Kitchen... As needed for headache per headache center    Tramadol Hcl 50 Mg Tabs (Tramadol hcl) ..... One by mouth q6h as needed back pain    Diclofenac Potassium 50 Mg Tabs (Diclofenac potassium) ..... One by mouth q8h as needed pain.  no more than 2 days per week.    Metaxalone 800 Mg Tabs (Metaxalone) ..... One by mouth qid as needed migraine per headache center.  Orders: [facility]- Est  Level 4 (16109)  Problem # 3:  PERIPHERAL NEUROPATHY (ICD-356.9)  Peripheral neuropathy uncertain type.  Suspect some sort of compression neuropathy or even perhaps a double mortons neuroma.  Doubt diabetic neuropathy given distribution and good DM control  Observe for now.  She will be cognizant of any foot compression.  Orders: [facility]- Est  Level 4 (60454)  Problem # 4:  DIABETES MELLITUS II, UNCOMPLICATED (ICD-250.00) At goal, but will increase metformin to a more typical dose.   Her updated medication list for this problem includes:    Aspirin Childrens 81 Mg Chew (Aspirin) .Marland Kitchen... Take 1 tablet by mouth once a day    Enalapril Maleate 10 Mg Tabs (Enalapril maleate) .Marland Kitchen... Take 1/2  tablet by mouth once a day    Metformin Hcl 500 Mg Tabs (Metformin hcl) ..... One by mouth two times a day  Orders: [facility]1C[facility] (09811) [facility]- Est  Level 4 (91478)  Complete Medication List: 1)  Albuterol 90 Mcg/act Aers (Albuterol) .... Inhale 2 puff using inhaler every four hours 2)  Aspirin Childrens 81 Mg Chew (Aspirin) .... Take 1 tablet by mouth once a day 3)  Atenolol 100 Mg Tabs (Atenolol) .... Take 1/2 tablet by mouth  daily 4)  Cyclobenzaprine Hcl 10 Mg Tabs (Cyclobenzaprine hcl) .... Take 1 tablet by mouth every night 5)  Enalapril Maleate 10 Mg Tabs (Enalapril maleate) .... Take 1/2  tablet by mouth once a day 6)  Flovent Hfa 220 Mcg/act Aero (Fluticasone propionate  hfa) .... Inhale 1 puff as directed twice a day 7)  Loratadine 10 Mg Tabs (Loratadine) .Marland Kitchen.. 1 tablet by mouth once a day 8)  Simvastatin 20 Mg Tabs (Simvastatin) .... Take 1 tablet by mouth every night 9)  Triamcinolone Acetonide 0.5 % Crea (Triamcinolone acetonide) .... Apply to affected areas bid 10)  Nortriptyline Hcl 50 Mg Caps (Nortriptyline hcl) .... One by mouth two times a day 11)  Freestyle Lancets Misc (Lancets) .... Use as directed 12)  Freestyle Lite Strp (Glucose blood) .... Use as directed 13)  Centrum Tabs (Multiple vitamins-minerals) .Marland Kitchen.. 1 tablet by mouth once daily 14)  Famotidine 40 Mg Tabs (Famotidine) .... One by mouth daily 15)  Metformin Hcl 500 Mg Tabs (Metformin hcl) .... One by mouth two times a day 16)  Topamax 25 Mg Tabs (Topiramate) .... Two pills by mouth at bedtime 17)  Baclofen 10 Mg Tabs (Baclofen) .... As needed for headache per headache center 18)  Lidoderm 5 % Ptch (Lidocaine) .... Per headache center 19)  Tramadol Hcl 50 Mg Tabs (Tramadol hcl) .... One by mouth q6h as needed back pain 20)  Antivert 25 Mg Tabs (Meclizine hcl) .... One by mouth q6h as needed dizziness 21)  Tessalon 200 Mg Caps (Benzonatate) .... One by mouth q6h as needed cough 22)  Align Caps (Probiotic product) .... Per otc instructions 23)  Diclofenac Potassium 50 Mg Tabs (Diclofenac potassium) .Marland KitchenMarland KitchenMarland Kitchen  One by mouth q8h as needed pain.  no more than 2 days per week. 24)  Metaxalone 800 Mg Tabs (Metaxalone) .... One by mouth qid as needed migraine per headache center.  Laboratory Results   Blood Tests   Date/Time Received: February 06, 2010 1:43 PM  Date/Time Reported: February 06, 2010 2:35 PM   HGBA1C: 6.7%   (Normal Range: Non-Diabetic -  3-6%   Control Diabetic - 6-8%)  Comments: ...............test performed by......Marland KitchenBonnie A. [place], MLS (ASCP)cm      Prevention & Chronic Care Immunizations   Influenza vaccine: Fluvax MCR  (05/25/2009)   Influenza vaccine due: 04/11/2010    Tetanus booster: 11/12/2007: given   Tetanus booster due: 11/11/2017    Pneumococcal vaccine: Pneumovax (Medicare)  (04/26/2007)   Pneumococcal vaccine due: None    H. zoster vaccine: 06/07/2007: Zostavax  Colorectal Screening   Hemoccult: Done.  (06/12/2003)   Hemoccult due: Not Indicated    Colonoscopy: Done.  (01/10/2004)   Colonoscopy due: 01/09/2014  Other Screening   Pap smear: Done.  (08/11/1997)   Pap smear due: Not Indicated    Mammogram: ASSESSMENT: Negative - BI-RADS 1^MM DIGITAL SCREENING  (10/25/2009)   Mammogram due: 10/26/2010    DXA bone density scan: normal  (01/04/2008)   DXA scan due: 01/03/2013    Smoking status: quit  (02/06/2010)  Diabetes Mellitus   HgbA1C: 6.7  (02/06/2010)   Hemoglobin A1C due: 10/10/2007    Eye exam: No diabetic retinopathy.  Per patient   (08/11/2008)    Foot exam: yes  (02/06/2010)   Foot exam action/deferral: Do today   High risk foot: No  (03/23/2009)   Foot care education: Not documented   Foot exam due: 12/25/2007    Urine microalbumin/creatinine ratio: Not documented   Urine microalbumin action/deferral: Not indicated    Diabetes flowsheet reviewed?: Yes   Progress toward A1C goal: At goal  Lipids   Total Cholesterol: 153  (03/23/2009)   LDL: 67  (03/23/2009)   LDL Direct: Not documented   HDL: 45  (03/23/2009)   Triglycerides: 205  (03/23/2009)    SGOT (AST): 31  (10/19/2009)   SGPT (ALT): 22  (10/19/2009)   Alkaline phosphatase: 98  (10/19/2009)   Total bilirubin: 0.3  (10/19/2009)    Lipid flowsheet reviewed?: Yes   Progress toward LDL goal: At goal  Hypertension   Last Blood Pressure: 118 / 76  (02/06/2010)   Serum creatinine: 0.97  (10/19/2009)    Serum potassium 4.5  (10/19/2009)    Hypertension flowsheet reviewed?: Yes   Progress toward BP goal: At goal  Self-Management Support :   Personal Goals (by the next clinic visit) :     Personal A1C goal: 7  (07/20/2009)     Personal blood pressure goal: 130/80  (07/20/2009)     Personal LDL goal: 100  (07/20/2009)    Diabetes self-management support: Written self-care plan  (10/19/2009)    Hypertension self-management support: Written self-care plan  (10/19/2009)    Hypertension self-management support not done because: Good outcomes  (11/21/2009)    Lipid self-management support: Written self-care plan  (10/19/2009)

## 2010-09-10 NOTE — Assessment & Plan Note (Signed)
Summary: itch all over & A1c, df   Vital Signs:  Patient profile:   70 year old female Height:      59.5 inches Weight:      185.7 pounds BMI:     37.01 Temp:     97.2 degrees F oral Pulse rate:   76 / minute BP sitting:   117 / 80  (left arm) Cuff size:   large  Vitals Entered By: Dedra Skeens CMA, (November 01, 2008 4:03 PM) Is Patient Diabetic? Yes   History of Present Illness: Weight loss noted.  Due for DM recheck.  BS have been running good.  Itch all over this winter.  No rash.  Does not seem to be related to new drugs started by headache center.  Headaches improved under headache center regimine.  Med list updated.  BP excellent with weight loss.  In fact was low at one MD visit.  Habits & Providers     Tobacco Status: never  Current Medications (verified): 1)  Albuterol 90 Mcg/act Aers (Albuterol) .... Inhale 2 Puff Using Inhaler Every Four Hours 2)  Aspirin Childrens 81 Mg Chew (Aspirin) .... Take 1 Tablet By Mouth Once A Day 3)  Atenolol 100 Mg Tabs (Atenolol) .... Take 1/2 Tablet By Mouth Twice A Day 4)  Benzonatate 200 Mg Caps (Benzonatate) .... Take 1 Capsule By Mouth Twice A Day 5)  Cyclobenzaprine Hcl 10 Mg Tabs (Cyclobenzaprine Hcl) .... Take 1 Tablet By Mouth Every Night 6)  Enalapril Maleate 10 Mg Tabs (Enalapril Maleate) .... Take 1 Tablet By Mouth Once A Day 7)  Flovent Hfa 220 Mcg/act Aero (Fluticasone Propionate  Hfa) .... Inhale 1 Puff As Directed Twice A Day 8)  Hydrochlorothiazide 12.5 Mg Caps (Hydrochlorothiazide) .Marland Kitchen.. 1 Capsule By Mouth Once A Day 9)  Loratadine 10 Mg Tabs (Loratadine) .Marland Kitchen.. 1 Tablet By Mouth Once A Day 10)  Meclizine Hcl 25 Mg Tabs (Meclizine Hcl) .... Take 1 Tablet By Mouth Every Six Hours As Needed 11)  Potassium Chloride Cr 10 Meq Tbcr (Potassium Chloride) .... Take 1 Cpasule By Mouth Once A Day 12)  Simvastatin 20 Mg Tabs (Simvastatin) .... Take 1 Tablet By Mouth Every Night 13)  Triamcinolone Acetonide 0.5 %  Crea  (Triamcinolone Acetonide) .... Apply To Affected Areas Bid 14)  Nortriptyline Hcl 50 Mg Caps (Nortriptyline Hcl) .... One By Mouth Qhs 15)  Freestyle Lancets   Misc (Lancets) .... Use As Directed 16)  Freestyle Lite   Strp (Glucose Blood) .... Use As Directed 17)  Miralax   Powd (Polyethylene Glycol 3350) .Marland Kitchen.. 17g By Mouth Once Daily As Needed Constipation Disp Qs For One Month Supply 18)  Centrum   Tabs (Multiple Vitamins-Minerals) .Marland Kitchen.. 1 Tablet By Mouth Once Daily 19)  Famotidine 40 Mg  Tabs (Famotidine) .... One By Mouth Daily 20)  Metformin Hcl 500 Mg Tabs (Metformin Hcl) .... One Twice Daily 21)  Topamax 25 Mg Tabs (Topiramate) .... One By Mouth Three Times A Day 22)  Baclofen 10 Mg Tabs (Baclofen) .... As Needed For [facility]ache [facility] 23)  Lidoderm 5 % Ptch (Lidocaine) .... Per [facility]  Allergies (verified): 1)  ! Hydrocodone-Acetaminophen (Hydrocodone-Acetaminophen) 2)  Amoxicillin (Amoxicillin) 3)  Azithromycin (Azithromycin) 4)  Clindamycin Hcl (Clindamycin Hcl) 5)  Codeine Phosphate (Codeine Phosphate) 6)  Demerol (Meperidine Hcl) 7)  Etodolac (Etodolac) 8)  Imitrex (Sumatriptan) 9)  Naprosyn (Naproxen) 10)  Neurontin (Gabapentin) 11)  Sulfamethoxazole (Sulfamethoxazole) 12)  Talwin Nx (Pentazocine-Naloxone)  Social History:  Smoking Status:  never  Physical Exam  General:  Well-developed,well-nourished,in no acute distress; alert,appropriate and cooperative throughout examination   Impression & Recommendations:  Problem # 1:  HYPERTENSION, BENIGN SYSTEMIC (ICD-401.1) Assessment Improved Try decrease atenolol Her updated medication list for this problem includes:    Atenolol 100 Mg Tabs (Atenolol) .Marland Kitchen... Take 1/2 tablet by mouth twice a day    Enalapril Maleate 10 Mg Tabs (Enalapril maleate) .Marland Kitchen... Take 1 tablet by mouth once a day    Hydrochlorothiazide 12.5 Mg Caps (Hydrochlorothiazide) .Marland Kitchen... 1 capsule by mouth once a day  Orders: Comp  [facility] (66063-01601) [facility]- Est  Level 4 (09323)  Problem # 2:  DIABETES MELLITUS II, UNCOMPLICATED (ICD-250.00) Assessment: Improved Continue diet and exercise Her updated medication list for this problem includes:    Aspirin Childrens 81 Mg Chew (Aspirin) .Marland Kitchen... Take 1 tablet by mouth once a day    Enalapril Maleate 10 Mg Tabs (Enalapril maleate) .Marland Kitchen... Take 1 tablet by mouth once a day    Metformin Hcl 500 Mg Tabs (Metformin hcl) ..... One twice daily  Orders: [facility]1C-[facility] (55732) [facility]- Est  Level 4 (99214)  Problem # 3:  MIGRAINE, UNSPEC., W/O INTRACTABLE MIGRAINE (ICD-346.90) Assessment: Improved  Continue FU with headache center The following medications were removed from the medication list:    Propoxyphene N-apap 100-650 Mg Tabs (Propoxyphene n-apap) .Marland Kitchen... Take 1 tablet by mouth five times a day    Tramadol Hcl 50 Mg Tabs (Tramadol hcl) .Marland Kitchen... Take 1 tablet by mouth every six hours Her updated medication list for this problem includes:    Aspirin Childrens 81 Mg Chew (Aspirin) .Marland Kitchen... Take 1 tablet by mouth once a day    Atenolol 100 Mg Tabs (Atenolol) .Marland Kitchen... Take 1/2 tablet by mouth twice a day  Orders: [facility]- Est  Level 4 (20254)  Complete Medication List: 1)  Albuterol 90 Mcg/act Aers (Albuterol) .... Inhale 2 puff using inhaler every four hours 2)  Aspirin Childrens 81 Mg Chew (Aspirin) .... Take 1 tablet by mouth once a day 3)  Atenolol 100 Mg Tabs (Atenolol) .... Take 1/2 tablet by mouth twice a day 4)  Benzonatate 200 Mg Caps (Benzonatate) .... Take 1 capsule by mouth twice a day 5)  Cyclobenzaprine Hcl 10 Mg Tabs (Cyclobenzaprine hcl) .... Take 1 tablet by mouth every night 6)  Enalapril Maleate 10 Mg Tabs (Enalapril maleate) .... Take 1 tablet by mouth once a day 7)  Flovent Hfa 220 Mcg/act Aero (Fluticasone propionate  hfa) .... Inhale 1 puff as directed twice a day 8)  Hydrochlorothiazide 12.5 Mg Caps (Hydrochlorothiazide) .Marland Kitchen.. 1 capsule by mouth once a day 9)  Loratadine 10  Mg Tabs (Loratadine) .Marland Kitchen.. 1 tablet by mouth once a day 10)  Meclizine Hcl 25 Mg Tabs (Meclizine hcl) .... Take 1 tablet by mouth every six hours as needed 11)  Potassium Chloride Cr 10 Meq Tbcr (Potassium chloride) .... Take 1 cpasule by mouth once a day 12)  Simvastatin 20 Mg Tabs (Simvastatin) .... Take 1 tablet by mouth every night 13)  Triamcinolone Acetonide 0.5 % Crea (Triamcinolone acetonide) .... Apply to affected areas bid 14)  Nortriptyline Hcl 50 Mg Caps (Nortriptyline hcl) .... One by mouth qhs 15)  Freestyle Lancets Misc (Lancets) .... Use as directed 16)  Freestyle Lite Strp (Glucose blood) .... Use as directed 17)  Miralax Powd (Polyethylene glycol 3350) .Marland Kitchen.. 17g by mouth once daily as needed constipation disp qs for one month supply 18)  Centrum Tabs (Multiple vitamins-minerals) .Marland KitchenMarland KitchenMarland Kitchen  1 tablet by mouth once daily 19)  Famotidine 40 Mg Tabs (Famotidine) .... One by mouth daily 20)  Metformin Hcl 500 Mg Tabs (Metformin hcl) .... One twice daily 21)  Topamax 25 Mg Tabs (Topiramate) .... One by mouth three times a day 22)  Baclofen 10 Mg Tabs (Baclofen) .... As needed for headache per headache center 23)  Lidoderm 5 % Ptch (Lidocaine) .... Per headache center Prescriptions: MECLIZINE HCL 25 MG TABS (MECLIZINE HCL) Take 1 tablet by mouth every six hours as needed  #60 x 12   Entered and Authorized by:   Doralee Albino MD   Signed by:   Doralee Albino MD on 11/01/2008   Method used:   Print then Give to Patient   RxID:   [id number] BENZONATATE 200 MG CAPS (BENZONATATE) Take 1 capsule by mouth twice a day  #60 x 12   Entered and Authorized by:   Doralee Albino MD   Signed by:   Doralee Albino MD on 11/01/2008   Method used:   Print then Give to Patient   RxID:   [id number] POTASSIUM CHLORIDE CR 10 MEQ TBCR (POTASSIUM CHLORIDE) Take 1 cpasule by mouth once a day  #30 x 12   Entered and Authorized by:   Doralee Albino MD   Signed by:   Doralee Albino MD on 11/01/2008    Method used:   Electronically to        [facility]* (retail)       [address] [place], [place]  [address]       Ph: [phone]       Fax: [phone]   RxID:   [id number]     Laboratory Results   Blood Tests   Date/Time Received: November 01, 2008 4:07 PM  Date/Time Reported: November 01, 2008 4:45 PM   HGBA1C: 6.3%   (Normal Range: Non-Diabetic - 3-6%   Control Diabetic - 6-8%)  Comments: ...........test performed by..........Marland KitchenDelore Pate-Gaddy, CMA entered by Terese Door, CMA

## 2010-09-10 NOTE — Progress Notes (Signed)
Summary: Triage  Phone Note Call from Patient Call back at Home Phone [phone]   Summary of Call: Pt is wanting to speak with rn about flu vaccine. Initial call taken by: Haydee Salter,  April 05, 2008 4:17 PM  Follow-up for Phone Call        told her they are not in yet. she may call back mid to late sept Follow-up by: [name],  April 05, 2008 4:19 PM

## 2010-09-10 NOTE — Progress Notes (Signed)
Summary: MRI    Complete Medication List: 1)  Albuterol 90 Mcg/act Aers (Albuterol) .... Inhale 2 puff using inhaler every four hours 2)  Aspirin Childrens 81 Mg Chew (Aspirin) .... Take 1 tablet by mouth once a day 3)  Atenolol 100 Mg Tabs (Atenolol) .... Take 1 tablet by mouth twice a day 4)  Benzonatate 200 Mg Caps (Benzonatate) .... Take 1 capsule by mouth twice a day 5)  Cimetidine 400 Mg Tabs (Cimetidine) .... Take 1 tablet by mouth twice a day 6)  Cyclobenzaprine Hcl 10 Mg Tabs (Cyclobenzaprine hcl) .... Take 1 tablet by mouth every night 7)  Enalapril Maleate 10 Mg Tabs (Enalapril maleate) .... Take 1 tablet by mouth once a day 8)  Flovent Hfa 220 Mcg/act Aero (Fluticasone propionate  hfa) .... Inhale 1 puff as directed twice a day 9)  Hydrochlorothiazide 12.5 Mg Caps (Hydrochlorothiazide) .Marland Kitchen.. 1 capsule by mouth once a day 10)  Loratadine 10 Mg Tabs (Loratadine) .Marland Kitchen.. 1 tablet by mouth once a day 11)  Meclizine Hcl 25 Mg Tabs (Meclizine hcl) .... Take 1 tablet by mouth every six hours 12)  Metformin Hcl 500 Mg Tabs (Metformin hcl) .... Take 1 tablet by mouth twice a day 13)  Potassium Chloride Cr 10 Meq Tbcr (Potassium chloride) .... Take 1 tablet by mouth once a day 14)  Propoxyphene N-apap 100-650 Mg Tabs (Propoxyphene n-apap) .... Take 1 tablet by mouth five times a day 15)  Simvastatin 20 Mg Tabs (Simvastatin) .... Take 1 tablet by mouth every night 16)  Tramadol Hcl 50 Mg Tabs (Tramadol hcl) .... Take 1 tablet by mouth every six hours 17)  Triamcinolone Acetonide 0.5 % Crea (Triamcinolone acetonide) .... Apply to affected areas bid 18)  Nortriptyline Hcl 50 Mg Caps (Nortriptyline hcl) .... One by mouth at bedtime 19)  Cipro 500 Mg Tabs (Ciprofloxacin hcl) .... One by mouth two times a day 20)  Freestyle Lancets Misc (Lancets) .... Use as directed 21)  Freestyle Lite Strp (Glucose blood) .... Use as directed 22)  Hydrocodone-acetaminophen 5-500 Mg Tabs (Hydrocodone-acetaminophen)  .... One by mouth q4h as needed pain  Phone Note Call from Patient Call back at Home Phone [phone] Call back at [phone]   Summary of Call: Pt states she is needing her MRI set up for a sooner date.  Pt states her shoulder is hurting so badly that she cannot do anything with that arm.  She would like to have the MRI ASAP so that she can enjoy the holiday. Initial call taken by: Haydee Salter,  July 29, 2007 10:38 AM  Follow-up for Phone Call        Pain worse.  Based on Xrays, suspect more from neck than from shoulder.  Will proceed with MRI of neck ASAP.  Also called fax Rx for vicodin Follow-up by: Doralee Albino MD,  July 29, 2007 11:15 AM  Additional Follow-up for Phone Call Additional follow up Details #1::        call to pt and advised that RN will call tomorrow AM to schedule MRI ASAP. advised that rx has been sent to pharmacy and pt states she has already picked it up. Additional Follow-up by: Theresia Lo RN,  July 29, 2007 5:17 PM  New Problems: ARM PAIN, LEFT (ICD-729.5) SHOULDER PAIN, LEFT (ICD-719.41)   New Problems: ARM PAIN, LEFT (ICD-729.5) SHOULDER PAIN, LEFT (ICD-719.41) New/Updated Medications: HYDROCODONE-ACETAMINOPHEN 5-500 MG  TABS (HYDROCODONE-ACETAMINOPHEN) One by mouth q4h as needed pain   Prescriptions: HYDROCODONE-ACETAMINOPHEN 5-500 MG  TABS (HYDROCODONE-ACETAMINOPHEN) One by mouth q4h as needed pain  #100 x 0   Entered and Authorized by:   Doralee Albino MD   Signed by:   Doralee Albino MD on 07/29/2007   Method used:   Printed then faxed to ...       [facility]*       [address] [place], [place]  [address]       Ph: [phone]       Fax: [phone]   RxID:   [id number]

## 2010-09-10 NOTE — Assessment & Plan Note (Signed)
Summary: FASTING LIPID & A1C AND F/U WITH [facility]   Vital Signs:  Patient Profile:   70 Years Old Female Height:     59.5 inches Weight:      191.3 pounds BMI:     38.13 Temp:     98.3 degrees F oral Pulse rate:   71 / minute BP sitting:   107 / 63  (right arm) Cuff size:   large  Pt. in pain?   no  Vitals Entered By: Garen Grams LPN (January 28, 2008 8:39 AM)                  PCP:  Doralee Albino MD  Chief Complaint:  f/u visit and labs.  History of Present Illness: High chol on simvastatin,  Needs lipid panel  She is fasting.  Had recent LFTs which were normal  DM Home blood sugars have been excellent.  Last A1C was 6.3  Being worked up by Dr. Loreta Ave for GI problems.  Has had lots of tests - no specific dx yet.  Dr. Loreta Ave has suggested switch from tagament to pepcid - done  Fatty liver, but no elevated LFTs    Current Allergies: ! HYDROCODONE-ACETAMINOPHEN (HYDROCODONE-ACETAMINOPHEN) AMOXICILLIN (AMOXICILLIN) AZITHROMYCIN (AZITHROMYCIN) CLINDAMYCIN HCL (CLINDAMYCIN HCL) CODEINE PHOSPHATE (CODEINE PHOSPHATE) DEMEROL (MEPERIDINE HCL) ETODOLAC (ETODOLAC) IMITREX (SUMATRIPTAN) NAPROSYN (NAPROXEN) NEURONTIN (GABAPENTIN) SULFAMETHOXAZOLE (SULFAMETHOXAZOLE) TALWIN NX (PENTAZOCINE-NALOXONE)      Physical Exam  General:     Well-developed,well-nourished,in no acute distress; alert,appropriate and cooperative throughout examination Lungs:     Normal respiratory effort, chest expands symmetrically. Lungs are clear to auscultation, no crackles or wheezes. Heart:     Normal rate and regular rhythm. S1 and S2 normal without gallop, murmur, click, rub or other extra sounds.   Last TD:  Done. (11/09/1997 12:00:00 AM) TD Result Date:  11/12/2007 TD Result:  given TD Next Due:  10 yr   Impression & Recommendations:  Problem # 1:  DIABETES MELLITUS II, UNCOMPLICATED (ICD-250.00) Assessment: Unchanged Good control  Hold metformen for two weeks to see if any  change in diarrhea.  Consider swithc to amaryl if yes. Her updated medication list for this problem includes:    Aspirin Childrens 81 Mg Chew (Aspirin) .Marland Kitchen... Take 1 tablet by mouth once a day    Enalapril Maleate 10 Mg Tabs (Enalapril maleate) .Marland Kitchen... Take 1 tablet by mouth once a day    Metformin Hcl 500 Mg Tabs (Metformin hcl) .Marland Kitchen... Take 1 tablet by mouth twice a day  Orders: A1C-[facility] (47829)   Problem # 2:  HYPERCHOLESTEROLEMIA (ICD-272.0) Needs check Her updated medication list for this problem includes:    Simvastatin 20 Mg Tabs (Simvastatin) .Marland Kitchen... Take 1 tablet by mouth every night  Orders: Lipid-[facility] ([address]657) [facility]- Est  Level 4 (84696)   Problem # 3:  HYPERTENSION, BENIGN SYSTEMIC (ICD-401.1) Assessment: Improved good control Her updated medication list for this problem includes:    Atenolol 100 Mg Tabs (Atenolol) .Marland Kitchen... Take 1 tablet by mouth twice a day    Enalapril Maleate 10 Mg Tabs (Enalapril maleate) .Marland Kitchen... Take 1 tablet by mouth once a day    Hydrochlorothiazide 12.5 Mg Caps (Hydrochlorothiazide) .Marland Kitchen... 1 capsule by mouth once a day  Orders: [facility]- Est  Level 4 (29528)   Complete Medication List: 1)  Albuterol 90 Mcg/act Aers (Albuterol) .... Inhale 2 puff using inhaler every four hours 2)  Aspirin Childrens 81 Mg Chew (Aspirin) .... Take 1 tablet by mouth once a day 3)  Atenolol  100 Mg Tabs (Atenolol) .... Take 1 tablet by mouth twice a day 4)  Benzonatate 200 Mg Caps (Benzonatate) .... Take 1 capsule by mouth twice a day 5)  Cyclobenzaprine Hcl 10 Mg Tabs (Cyclobenzaprine hcl) .... Take 1 tablet by mouth every night 6)  Enalapril Maleate 10 Mg Tabs (Enalapril maleate) .... Take 1 tablet by mouth once a day 7)  Flovent Hfa 220 Mcg/act Aero (Fluticasone propionate  hfa) .... Inhale 1 puff as directed twice a day 8)  Hydrochlorothiazide 12.5 Mg Caps (Hydrochlorothiazide) .Marland Kitchen.. 1 capsule by mouth once a day 9)  Loratadine 10 Mg Tabs (Loratadine) .Marland Kitchen.. 1 tablet by mouth  once a day 10)  Meclizine Hcl 25 Mg Tabs (Meclizine hcl) .... Take 1 tablet by mouth every six hours as needed 11)  Metformin Hcl 500 Mg Tabs (Metformin hcl) .... Take 1 tablet by mouth twice a day 12)  Potassium Chloride Cr 10 Meq Tbcr (Potassium chloride) .... Take 1 cpasule by mouth once a day 13)  Propoxyphene N-apap 100-650 Mg Tabs (Propoxyphene n-apap) .... Take 1 tablet by mouth five times a day 14)  Simvastatin 20 Mg Tabs (Simvastatin) .... Take 1 tablet by mouth every night 15)  Tramadol Hcl 50 Mg Tabs (Tramadol hcl) .... Take 1 tablet by mouth every six hours 16)  Triamcinolone Acetonide 0.5 % Crea (Triamcinolone acetonide) .... Apply to affected areas bid 17)  Nortriptyline Hcl 50 Mg Caps (Nortriptyline hcl) .... One by mouth at bedtime 18)  Cipro 500 Mg Tabs (Ciprofloxacin hcl) .... One by mouth two times a day 19)  Freestyle Lancets Misc (Lancets) .... Use as directed 20)  Freestyle Lite Strp (Glucose blood) .... Use as directed 21)  Miralax Powd (Polyethylene glycol 3350) .Marland Kitchen.. 17g by mouth once daily as needed constipation disp qs for one month supply 22)  Centrum Tabs (Multiple vitamins-minerals) .Marland Kitchen.. 1 tablet by mouth once daily 23)  Famotidine 40 Mg Tabs (Famotidine) .... One by mouth daily    Prescriptions: FAMOTIDINE 40 MG  TABS (FAMOTIDINE) one by mouth daily  #90 x 3   Entered and Authorized by:   Doralee Albino MD   Signed by:   Doralee Albino MD on 01/28/2008   Method used:   Electronically sent to ...       [facility]*       [address] [place], [place]  [address]       Ph: [phone]       Fax: [phone]   RxID:   [id number]  ]   Laboratory Results   Blood Tests   Date/Time Received: January 28, 2008 9:13 AM  Date/Time Reported: January 28, 2008 9:40 AM   HGBA1C: 6.4%   (Normal Range: Non-Diabetic - 3-6%   Control Diabetic - 6-8%)  Comments: ...........test performed by...........Marland KitchenTerese Door, CMA

## 2010-09-10 NOTE — Assessment & Plan Note (Signed)
Summary: flu shot,tcb  Nurse Visit   Vital Signs:  Patient profile:   70 year old female Temp:     98.6 degrees F  Vitals Entered By: Jone Baseman CMA (May 25, 2009 1:52 PM)  Allergies: 1)  ! Hydrocodone-Acetaminophen (Hydrocodone-Acetaminophen) 2)  Amoxicillin (Amoxicillin) 3)  Azithromycin (Azithromycin) 4)  Clindamycin Hcl (Clindamycin Hcl) 5)  Codeine Phosphate (Codeine Phosphate) 6)  Demerol (Meperidine Hcl) 7)  Etodolac (Etodolac) 8)  Imitrex (Sumatriptan) 9)  Naprosyn (Naproxen) 10)  Neurontin (Gabapentin) 11)  Sulfamethoxazole (Sulfamethoxazole) 12)  Talwin Nx  Immunizations Administered:  Influenza Vaccine # 1:    Vaccine Type: Fluvax MCR    Site: right deltoid    Mfr: GlaxoSmithKline    Dose: 0.5 ml    Route: IM    Given by: Jone Baseman CMA    Exp. Date: 02/07/2010    Lot #: [id number]    VIS given: 8.10.10  Flu Vaccine Consent Questions:    Do you have a history of severe allergic reactions to this vaccine? no    Any prior history of allergic reactions to egg and/or gelatin? no    Do you have a sensitivity to the preservative Thimersol? no    Do you have a past history of Guillan-Barre Syndrome? no    Do you currently have an acute febrile illness? no    Have you ever had a severe reaction to latex? no    Vaccine information given and explained to patient? yes    Are you currently pregnant? no  Orders Added: 1)  Influenza Vaccine MCR [00025] 2)  Administration Flu vaccine - MCR [G0008]

## 2010-09-10 NOTE — Miscellaneous (Signed)
Summary: breast center  Clinical Lists Changes [name] from the breast center called to get a letter of medical necessity so they can do the prior auth for the biopsy. I called her back & she will call me back. need to confirm if anything specific needs to be in the letter. [phone].[name]  October 01, 2009 4:44 PM  [name] is in training at [facility]. she does not need a letter. she is waiting for [place] to get the precert so pt can have the biopsy. if the insurance company denies it, then they can write something for [name] as to why this is needed. To [name] who has been handling this. have we obtained ok from insurance?...sign  Spoke with [name] this Am, she will fax over form for Dr to sign, if she needs [facility] to do auth she will let me know by phone  and not fax form. Form recieved from [facility] for Dr [name] to sign. Dr [name] signed and form faxed back to [facility].[name]  October 03, 2009 12:00 PM

## 2010-09-10 NOTE — Assessment & Plan Note (Signed)
Summary: uti?,df   Vital Signs:  Patient profile:   70 year old female Weight:      186.7 pounds Temp:     98.3 degrees F oral Pulse rate:   90 / minute BP sitting:   127 / 80  (right arm) Cuff size:   large  Vitals Entered By: Arlyss Repress CMA, (July 17, 2010 10:59 AM) CC: dysuria x 2 days. Is Patient Diabetic? No Pain Assessment Patient in pain? no        Primary Care Provider:  Doralee Albino MD  CC:  dysuria x 2 days.Marland Kitchen  History of Present Illness: 69 y/o F with DM, Migraine HA presents for dysuria since yesterday.    DYSURIA Onset:  2 days Description: Yesterday Pt woke up with suprapubic pain that she describes as "pressure".  This pain is intermittent.  She also describes vaginal pressure with voiding.  She denies burning sensation with voiding. She had nausea one time the night before last, that lasted only minutes.  She may also be having left sided flank pain that is new an intermitten.   Modifying factors: She states that in April of this year she had a similar episode and was Dx with UTI and treated with Cipro.  Before treatment she had incidence of not being able to void.    Symptoms Urgency:  yes, did not make it to the toilet last night Frequency:  yes Hesitancy:  yes Hematuria:  yes Flank Pain:  left sided  Fever: no Nausea/Vomiting:  nausea night before last Missed LMP: no, hysterectomy 2001 STD exposure: no Discharge: no Irritants: no Rash: no  Red Flags   More than 3 UTI's last 12 months:  Last UTI was in 11/2009.   PMH of  Diabetes or Immunosuppression:  yes, A1C 6.1% 05/22/10 Renal Disease/Calculi: no Urinary Tract Abnormality: no  Instrumentation or Trauma: no  ***When discussing DM, it seems pt had fasting CBG of 80 yesterday and this morning and last night.  She has lost 6 lbs since last visit in October.     Habits & Providers  Alcohol-Tobacco-Diet     Tobacco Status: quit > 6 months  Current Medications (verified): 1)   Albuterol 90 Mcg/act Aers (Albuterol) .... Inhale 2 Puff Using Inhaler Every Four Hours 2)  Aspirin Childrens 81 Mg Chew (Aspirin) .... Take 1 Tablet By Mouth Once A Day 3)  Atenolol 100 Mg Tabs (Atenolol) .... Take 1/2 Tablet By Mouth Daily 4)  Cyclobenzaprine Hcl 10 Mg Tabs (Cyclobenzaprine Hcl) .... Take 1 Tablet By Mouth Every Night 5)  Enalapril Maleate 10 Mg Tabs (Enalapril Maleate) .... Take 1/2  Tablet By Mouth Once A Day 6)  Flovent Hfa 220 Mcg/act Aero (Fluticasone Propionate  Hfa) .... Inhale 1 Puff As Directed Twice A Day 7)  Loratadine 10 Mg Tabs (Loratadine) .Marland Kitchen.. 1 Tablet By Mouth Once A Day 8)  Simvastatin 20 Mg Tabs (Simvastatin) .... Take 1 Tablet By Mouth Every Night 9)  Triamcinolone Acetonide 0.5 %  Crea (Triamcinolone Acetonide) .... Apply To Affected Areas Bid 10)  Nortriptyline Hcl 50 Mg Caps (Nortriptyline Hcl) .... One By Mouth Two Times A Day 11)  Freestyle Lancets   Misc (Lancets) .... Use As Directed 12)  Freestyle Lite   Strp (Glucose Blood) .... Use As Directed 13)  Centrum   Tabs (Multiple Vitamins-Minerals) .Marland Kitchen.. 1 Tablet By Mouth Once Daily 14)  Famotidine 40 Mg  Tabs (Famotidine) .... One By Mouth Daily 15)  Metformin Hcl  500 Mg Tabs (Metformin Hcl) .... One By Mouth Two Times A Day 16)  Topamax 25 Mg Tabs (Topiramate) .... Two Pills By Mouth At Bedtime 17)  Baclofen 10 Mg Tabs (Baclofen) .... As Needed For Headache [facility] 18)  Lidoderm 5 % Ptch (Lidocaine) .... Per [facility] 19)  Tramadol Hcl 50 Mg  Tabs (Tramadol Hcl) .... One By Mouth Q6h As Needed Back Pain 20)  Antivert 25 Mg Tabs (Meclizine Hcl) .... One By Mouth Q6h As Needed Dizziness 21)  Tessalon 200 Mg Caps (Benzonatate) .... One By Mouth Q6h As Needed Cough 22)  Align  Caps (Probiotic Product) .... Per Otc Instructions 23)  Diclofenac Potassium 50 Mg Tabs (Diclofenac Potassium) .... One By Mouth Q8h As Needed Pain.  No More Than 2 Days Per Week. 24)  Metaxalone 800 Mg Tabs  (Metaxalone) .... One By Mouth Qid As Needed Migraine Per [facility]. 25)  Omeprazole 40 Mg Cpdr (Omeprazole) .... Take One Every Day.  Stop Taking It If You Have Problems With Headaches. 26)  Cambia 50 Mg Pack (Diclofenac Potassium) .... Per [facility]  Allergies (verified): 1)  ! Hydrocodone-Acetaminophen (Hydrocodone-Acetaminophen) 2)  Amoxicillin (Amoxicillin) 3)  Azithromycin (Azithromycin) 4)  Clindamycin Hcl (Clindamycin Hcl) 5)  Codeine Phosphate (Codeine Phosphate) 6)  Demerol (Meperidine Hcl) 7)  Etodolac (Etodolac) 8)  Imitrex (Sumatriptan) 9)  Naprosyn (Naproxen) 10)  Neurontin (Gabapentin) 11)  Sulfamethoxazole (Sulfamethoxazole) 12)  Talwin Nx  Social History: Smoking Status:  quit > 6 months  Review of Systems       per hpi   Physical Exam  General:  Well-developed,well-nourished,in no acute distress; alert,appropriate and cooperative throughout examination. Vitals reviewed.  Abdomen:  Bowel sounds positive,abdomen soft and non-tender without masses, organomegaly or hernias noted.  Genitalia:  NO tenderness of palpation of suprapubic area Msk:  No tenderness to palpation in flank/back areas   Impression & Recommendations:  Problem # 1:  DYSURIA (ICD-788.1) Assessment New Pt with suprapubic tenderness, dysuria, urgency, frequency since yesterday. Flank pain per history, but not on exam.  UA showed +blood, +leuk, -nitrates, +wbcs.  She has multiple antibiotic allergies and so cannot take Keflex.  Will treat with Cipro as it has worked in the past.  Will treat for extended course because this is not uncomplicated uti given diabetes.  Will get UCx.  Red flags given to rtc or ER (fever, vomiting, worsening abd pain). We also discussed ddx of renal stones.  Pt advised to drink lots of water and let us know if pain worsened.    Her updated medication list for this problem includes:    Cipro 500 Mg Tabs (Ciprofloxacin hcl) .Marland Kitchen... 1 tab by mouth two times a  day x 7 days for bladder infection  Orders: Urinalysis-[facility] (00000) [facility]- Est Level  3 ([address])  Complete Medication List: 1)  Albuterol 90 Mcg/act Aers (Albuterol) .... Inhale 2 puff using inhaler every four hours 2)  Aspirin Childrens 81 Mg Chew (Aspirin) .... Take 1 tablet by mouth once a day 3)  Atenolol 100 Mg Tabs (Atenolol) .... Take 1/2 tablet by mouth daily 4)  Cyclobenzaprine Hcl 10 Mg Tabs (Cyclobenzaprine hcl) .... Take 1 tablet by mouth every night 5)  Enalapril Maleate 10 Mg Tabs (Enalapril maleate) .... Take 1/2  tablet by mouth once a day 6)  Flovent Hfa 220 Mcg/act Aero (Fluticasone propionate  hfa) .... Inhale 1 puff as directed twice a day 7)  Loratadine 10 Mg Tabs (Loratadine) .Marland Kitchen.. 1 tablet  by mouth once a day 8)  Simvastatin 20 Mg Tabs (Simvastatin) .... Take 1 tablet by mouth every night 9)  Triamcinolone Acetonide 0.5 % Crea (Triamcinolone acetonide) .... Apply to affected areas bid 10)  Nortriptyline Hcl 50 Mg Caps (Nortriptyline hcl) .... One by mouth two times a day 11)  Freestyle Lancets Misc (Lancets) .... Use as directed 12)  Freestyle Lite Strp (Glucose blood) .... Use as directed 13)  Centrum Tabs (Multiple vitamins-minerals) .Marland Kitchen.. 1 tablet by mouth once daily 14)  Famotidine 40 Mg Tabs (Famotidine) .... One by mouth daily 15)  Metformin Hcl 500 Mg Tabs (Metformin hcl) .... One by mouth two times a day 16)  Topamax 25 Mg Tabs (Topiramate) .... Two pills by mouth at bedtime 17)  Baclofen 10 Mg Tabs (Baclofen) .... As needed for headache per headache center 18)  Lidoderm 5 % Ptch (Lidocaine) .... Per headache center 19)  Tramadol Hcl 50 Mg Tabs (Tramadol hcl) .... One by mouth q6h as needed back pain 20)  Antivert 25 Mg Tabs (Meclizine hcl) .... One by mouth q6h as needed dizziness 21)  Tessalon 200 Mg Caps (Benzonatate) .... One by mouth q6h as needed cough 22)  Align Caps (Probiotic product) .... Per otc instructions 23)  Diclofenac Potassium 50 Mg Tabs  (Diclofenac potassium) .... One by mouth q8h as needed pain.  no more than 2 days per week. 24)  Metaxalone 800 Mg Tabs (Metaxalone) .... One by mouth qid as needed migraine per headache center. 25)  Omeprazole 40 Mg Cpdr (Omeprazole) .... Take one every day.  stop taking it if you have problems with headaches. 26)  Cambia 50 Mg Pack (Diclofenac potassium) .... Per headache center 27)  Cipro 500 Mg Tabs (Ciprofloxacin hcl) .Marland Kitchen.. 1 tab by mouth two times a day x 7 days for bladder infection  Patient Instructions: 1)  Please schedule a follow-up appointment right away or go to ER if you start to have fever, stomach pain, vomiting. 2)  You may have a bladder infection, please take Cipro 500mg  two times a day x 7 days.   3)  Please make appointment with Dr [name] to discuss diabetes.  But if you start to have sugars in the 70s and symptoms of lightheadedness and shakikness, come to [facility] sooner.    Prescriptions: CIPRO 500 MG TABS (CIPROFLOXACIN HCL) 1 tab by mouth two times a day x 7 days for bladder infection  #14 x 0   Entered and Authorized by:   Angeline Slim MD   Signed by:   Angeline Slim MD on 07/17/2010   Method used:   Electronically to        [facility]* (retail)       [address] [place], [place]  [address]       Ph: [phone]       Fax: [phone]   RxID:   [id number]    Orders Added: 1)  Urinalysis-[facility] [00000] 2)  [facility]- Est Level  3 [84132]    Laboratory Results   Urine Tests  Date/Time Received: July 17, 2010 11:02 AM  Date/Time Reported: July 17, 2010 11:21 AM   Routine Urinalysis   Color: yellow Appearance: Cloudy Glucose: negative   (Normal Range: Negative) Bilirubin: negative   (Normal Range: Negative) Ketone: negative   (Normal Range: Negative) Spec. Gravity: 1.025   (Normal Range: 1.003-1.035) Blood: moderate   (Normal  Range: Negative) pH: 7.0   (Normal Range: 5.0-8.0) Protein: 30   (Normal Range:  Negative) Urobilinogen: 0.2   (Normal Range: 0-1) Nitrite: negative   (Normal Range: Negative) Leukocyte Esterace: large   (Normal Range: Negative)  Urine Microscopic WBC/HPF: loaded with some clumps RBC/HPF: 10-20 Bacteria/HPF: 3+ Epithelial/HPF: <5    Comments: ...............test performed by......Marland KitchenBonnie A. [place], MLS (ASCP)cm

## 2010-09-12 NOTE — Assessment & Plan Note (Signed)
Summary: f/u dm & labs/eo   Vital Signs:  Patient profile:   70 year old female Weight:      185 pounds Temp:     984 degrees F BP sitting:   148 / 88  Vitals Entered By: Loralee Pacas CMA (August 14, 2010 1:59 PM)  Serial Vital Signs/Assessments:  Time      Position  BP       Pulse  Resp  Temp     By                     128/72                         Doralee Albino MD   Primary Care Provider:  Doralee Albino MD   History of Present Illness: [name] has lost 8 lbs over last 3 months.   Home BPs have been running great - well within normal. Had a bout of significant diarrhea with some hemorrhoidal? bleeding.  Seen by GI.  C diff neg.  Diarrhea has now resolved off antibiotics.    Current Medications (verified): 1)  Albuterol 90 Mcg/act Aers (Albuterol) .... Inhale 2 Puff Using Inhaler Every Four Hours 2)  Aspirin Childrens 81 Mg Chew (Aspirin) .... Take 1 Tablet By Mouth Once A Day 3)  Atenolol 100 Mg Tabs (Atenolol) .... Take 1/2 Tablet By Mouth Daily 4)  Cyclobenzaprine Hcl 10 Mg Tabs (Cyclobenzaprine Hcl) .... Take 1 Tablet By Mouth Every Night 5)  Enalapril Maleate 10 Mg Tabs (Enalapril Maleate) .... Take 1/2  Tablet By Mouth Once A Day 6)  Flovent Hfa 220 Mcg/act Aero (Fluticasone Propionate  Hfa) .... Inhale 1 Puff As Directed Twice A Day 7)  Loratadine 10 Mg Tabs (Loratadine) .Marland Kitchen.. 1 Tablet By Mouth Once A Day 8)  Simvastatin 20 Mg Tabs (Simvastatin) .... Take 1 Tablet By Mouth Every Night 9)  Triamcinolone Acetonide 0.5 %  Crea (Triamcinolone Acetonide) .... Apply To Affected Areas Bid 10)  Nortriptyline Hcl 50 Mg Caps (Nortriptyline Hcl) .... One By Mouth Two Times A Day 11)  Freestyle Lancets   Misc (Lancets) .... Use As Directed 12)  Freestyle Lite   Strp (Glucose Blood) .... Use As Directed 13)  Centrum   Tabs (Multiple Vitamins-Minerals) .Marland Kitchen.. 1 Tablet By Mouth Once Daily 14)  Famotidine 40 Mg  Tabs (Famotidine) .... One By Mouth Daily 15)  Metformin Hcl 500 Mg Tabs  (Metformin Hcl) .... One By Mouth Two Times A Day 16)  Topamax 25 Mg Tabs (Topiramate) .... Two Pills By Mouth At Bedtime 17)  Baclofen 10 Mg Tabs (Baclofen) .... As Needed [facility] 18)  Lidoderm 5 % Ptch (Lidocaine) .... [facility] 19)  Tramadol Hcl 50 Mg  Tabs (Tramadol Hcl) .... One By Mouth Q6h As Needed Back Pain 20)  Antivert 25 Mg Tabs (Meclizine Hcl) .... One By Mouth Q6h As Needed Dizziness 21)  Tessalon 200 Mg Caps (Benzonatate) .... One By Mouth Q6h As Needed Cough 22)  Align  Caps (Probiotic Product) .... Take 1 Capsule Twice Daily 23)  Diclofenac Potassium 50 Mg Tabs (Diclofenac Potassium) .... One By Mouth Q8h As Needed Pain.  No More Than 2 Days Per Week. 24)  Cambia 50 Mg Pack (Diclofenac Potassium) .... [facility]  Allergies (verified): 1)  ! Hydrocodone-Acetaminophen (Hydrocodone-Acetaminophen) 2)  Amoxicillin (Amoxicillin) 3)  Azithromycin (Azithromycin) 4)  Clindamycin Hcl (Clindamycin Hcl) 5)  Codeine Phosphate (Codeine Phosphate) 6)  Demerol (Meperidine Hcl) 7)  Etodolac (Etodolac) 8)  Imitrex (Sumatriptan) 9)  Naprosyn (Naproxen) 10)  Neurontin (Gabapentin) 11)  Sulfamethoxazole (Sulfamethoxazole) 12)  Talwin Nx  Past History:  Past medical, surgical, family and social histories (including risk factors) reviewed, and no changes noted (except as noted below).  Past Medical History: Reviewed history from 05/22/2010 and no changes required. Also takes butal/APA/caff for migraine., carbafed DM prn cough, fatty liver by ultrasound 3/07, HNP of cervical and lumbar spine, subdural hematoma from fall 1/05 Has nasal CPAP for sleep apnea  Past Surgical History: Reviewed history from 10/08/2006 and no changes required. Appendectomy -, Barium Enema -, Bone Density 4/99 T=-0.4 -, breast surgery -, CT - spine -, CXR -, ECG -, Hysterectomy still has Rt. Ovary -, PFTs nl - 09/12/1999  Family History: Reviewed history from  10/08/2006 and no changes required. - DM, ETOHism, + Ca, HBP, CAD, CVA, depression,  Social History: Reviewed history from 10/08/2006 and no changes required. disabled 2nd to DJD; non smoker; non drinker; low fat diet; exercise limited due to back pain  Physical Exam  General:  Well-developed,well-nourished,in no acute distress; alert,appropriate and cooperative throughout examination Neck:  No deformities, masses, or tenderness noted. Lungs:  Normal respiratory effort, chest expands symmetrically. Lungs are clear to auscultation, no crackles or wheezes. Heart:  Normal rate and regular rhythm. S1 and S2 normal without gallop, murmur, click, rub or other extra sounds.   Impression & Recommendations:  Problem # 1:  HYPERTENSION, BENIGN SYSTEMIC (ICD-401.1) Assessment Improved  Her updated medication list for this problem includes:    Atenolol 100 Mg Tabs (Atenolol) .Marland Kitchen... Take 1/2 tablet by mouth daily    Enalapril Maleate 10 Mg Tabs (Enalapril maleate) .Marland Kitchen... 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 Take 1 tablet by mouth once a day    Enalapril Maleate 10 Mg Tabs (Enalapril maleate) .Marland Kitchen... Take 1/2  tablet by mouth once a day    Metformin Hcl 500 Mg Tabs (Metformin hcl) ..... One by mouth two times a day  Orders: A1C-[facility] (09811) [facility]- Est  Level 4 (91478)  Labs Reviewed: Creat: 0.98 (05/23/2010)      Last Eye Exam: No diabetic retinopathy.  Per patient  (08/11/2008) Reviewed HgBA1c results: 5.9 (08/14/2010)  6.1 (05/22/2010)  Complete Medication List: 1)  Albuterol 90 Mcg/act Aers (Albuterol) .... Inhale 2 puff using inhaler every four hours 2)  Aspirin Childrens 81 Mg Chew (Aspirin) .... Take 1 tablet by mouth once a day 3)  Atenolol 100 Mg Tabs (Atenolol) .... Take 1/2 tablet by mouth daily 4)  Cyclobenzaprine Hcl 10 Mg Tabs (Cyclobenzaprine hcl) .... Take 1 tablet by mouth every night 5)  Enalapril Maleate 10 Mg Tabs (Enalapril maleate) .... Take 1/2  tablet by mouth once a day 6)  Flovent Hfa 220 Mcg/act Aero (Fluticasone propionate  hfa) .Marland KitchenMarland KitchenMarland Kitchen  Inhale 1 puff as directed twice a day 7)  Loratadine 10 Mg Tabs (Loratadine) .Marland Kitchen.. 1 tablet by mouth once a day 8)  Simvastatin 20 Mg Tabs (Simvastatin) .... Take 1 tablet by mouth every night 9)  Triamcinolone Acetonide 0.5 % Crea (Triamcinolone acetonide) .... Apply to affected areas bid 10)  Nortriptyline Hcl 50 Mg Caps (Nortriptyline hcl) .... One by mouth two times a day 11)  Freestyle Lancets Misc (Lancets) .... Use as directed 12)  Freestyle Lite Strp (Glucose blood) .... Use as directed 13)  Centrum Tabs (Multiple vitamins-minerals) .Marland Kitchen.. 1 tablet by mouth once daily 14)  Famotidine 40 Mg Tabs (Famotidine) .... One by mouth daily 15)  Metformin Hcl 500 Mg Tabs (Metformin hcl) .... One by mouth two times a day 16)  Topamax 25 Mg Tabs (Topiramate) .... Two pills by mouth at bedtime 17)  Baclofen 10 Mg Tabs (Baclofen) .... As needed for headache per headache center 18)  Lidoderm 5 % Ptch (Lidocaine) .... Per headache center 19)  Tramadol Hcl 50 Mg Tabs (Tramadol hcl) .... One by mouth q6h as needed back pain 20)  Antivert 25 Mg Tabs (Meclizine hcl) .... One by mouth q6h as needed dizziness 21)  Tessalon 200 Mg Caps (Benzonatate) .... One by mouth q6h as needed cough 22)  Align Caps (Probiotic product) .... Take 1 capsule twice  daily 23)  Diclofenac Potassium 50 Mg Tabs (Diclofenac potassium) .... One by mouth q8h as needed pain.  no more than 2 days per week. 24)  Cambia 50 Mg Pack (Diclofenac potassium) .... Per headache center  Patient Instructions: 1)  Please schedule a follow-up appointment in 3 months .    Orders Added: 1)  A1C-[facility] [83036] 2)  CT without Contrast [CT w/o contrast] 3)  [facility]- Est  Level 4 [21308]     Prevention & Chronic Care Immunizations   Influenza vaccine: Fluvax MCR  (05/22/2010)   Influenza vaccine due: 04/11/2010    Tetanus booster: 11/12/2007: given   Tetanus booster due: 11/11/2017    Pneumococcal vaccine: Pneumovax (Medicare)  (04/26/2007)   Pneumococcal vaccine due: None    H. zoster vaccine: 06/07/2007: Zostavax  Colorectal Screening   Hemoccult: Done.  (06/12/2003)   Hemoccult due: Not Indicated    Colonoscopy: Done.  (01/10/2004)   Colonoscopy due: 01/09/2014  Other Screening   Pap smear: Done.  (08/11/1997)   Pap smear due: Not Indicated    Mammogram: ASSESSMENT: Negative - BI-RADS 1^MM DIGITAL SCREENING  (10/25/2009)   Mammogram due: 10/26/2010    DXA bone density scan: normal  (01/04/2008)   DXA scan due: 01/03/2013    Smoking status: quit  (07/25/2010)  Diabetes Mellitus   HgbA1C: 5.9  (08/14/2010)   Hemoglobin A1C due: 10/10/2007    Eye exam: No diabetic retinopathy.  Per patient   (08/11/2008)    Foot exam: yes  (02/06/2010)   Foot exam action/deferral: Do today   High risk foot: No  (03/23/2009)   Foot care education: Not documented   Foot exam due: 12/25/2007    Urine microalbumin/creatinine ratio: Not documented   Urine microalbumin action/deferral: Not indicated    Diabetes flowsheet reviewed?: Yes   Progress toward A1C goal: At goal  Lipids   Total Cholesterol: 145  (05/23/2010)   Lipid panel action/deferral: Lipid Panel ordered   LDL: 51  (05/23/2010)   LDL Direct: Not documented   HDL: 47  (05/23/2010)   Triglycerides:  235  (05/23/2010)    SGOT (AST): 43  (  05/23/2010)   SGPT (ALT): 32  (05/23/2010)   Alkaline phosphatase: 92  (05/23/2010)   Total bilirubin: 0.4  (05/23/2010)    Lipid flowsheet reviewed?: Yes   Progress toward LDL goal: At goal  Hypertension   Last Blood Pressure: 148 / 88  (08/14/2010)   Serum creatinine: 0.98  (05/23/2010)   Serum potassium 3.9  (05/23/2010)    Hypertension flowsheet reviewed?: Yes   Progress toward BP goal: At goal  Self-Management Support :   Personal Goals (by the next clinic visit) :     Personal A1C goal: 7  (07/20/2009)     Personal blood pressure goal: 130/80  (07/20/2009)     Personal LDL goal: 100  (07/20/2009)    Diabetes self-management support: Written self-care plan  (10/19/2009)    Hypertension self-management support: Written self-care plan  (10/19/2009)    Hypertension self-management support not done because: Good outcomes  (11/21/2009)    Lipid self-management support: Written self-care plan  (10/19/2009)   Laboratory Results   Blood Tests   Date/Time Received: August 14, 2010 2:55 PM  Date/Time Reported: August 14, 2010 4:08 PM   HGBA1C: 5.9%   (Normal Range: Non-Diabetic - 3-6%   Control Diabetic - 6-8%)  Comments: ...............test performed by......Marland KitchenBonnie A. [place], MLS (ASCP)cm

## 2010-09-12 NOTE — Assessment & Plan Note (Signed)
Summary: left lower abdominal pain/ls   Vital Signs:  Patient profile:   70 year old female Height:      59.5 inches Weight:      188.2 pounds BMI:     37.51 Temp:     98.2 degrees F oral Pulse rate:   76 / minute BP sitting:   153 / 82  (left arm) Cuff size:   large  Vitals Entered By: Jimmy Footman, CMA (July 25, 2010 4:23 PM) CC: left lower abdominal pain x2 days Is Patient Diabetic? Yes Did you bring your meter with you today? No Pain Assessment Patient in pain? yes     Location: lower abdominal   Primary Care Provider:  Doralee Albino MD  CC:  left lower abdominal pain x2 days.  History of Present Illness: Pt has some left lower abd pain that hs been present for 2 days. She has had some dysuria and says that she has been urinating a little less, however she is still drinking about 6 bottles of water a day. She says that last night she had LLQ pain that was worse with severe pain that came and went. She says her stool pattern doesn't correlate with the pain. She has been having some chills for the last 10-14 days. No blood in urine or stool. Stool is normal. She just finished a course of Cipro for a UTI.   Habits & Providers  Alcohol-Tobacco-Diet     Tobacco Status: quit  Current Medications (verified): 1)  Albuterol 90 Mcg/act Aers (Albuterol) .... Inhale 2 Puff Using Inhaler Every Four Hours 2)  Aspirin Childrens 81 Mg Chew (Aspirin) .... Take 1 Tablet By Mouth Once A Day 3)  Atenolol 100 Mg Tabs (Atenolol) .... Take 1/2 Tablet By Mouth Daily 4)  Cyclobenzaprine Hcl 10 Mg Tabs (Cyclobenzaprine Hcl) .... Take 1 Tablet By Mouth Every Night 5)  Enalapril Maleate 10 Mg Tabs (Enalapril Maleate) .... Take 1/2  Tablet By Mouth Once A Day 6)  Flovent Hfa 220 Mcg/act Aero (Fluticasone Propionate  Hfa) .... Inhale 1 Puff As Directed Twice A Day 7)  Loratadine 10 Mg Tabs (Loratadine) .Marland Kitchen.. 1 Tablet By Mouth Once A Day 8)  Simvastatin 20 Mg Tabs (Simvastatin) .... Take 1 Tablet  By Mouth Every Night 9)  Triamcinolone Acetonide 0.5 %  Crea (Triamcinolone Acetonide) .... Apply To Affected Areas Bid 10)  Nortriptyline Hcl 50 Mg Caps (Nortriptyline Hcl) .... One By Mouth Two Times A Day 11)  Freestyle Lancets   Misc (Lancets) .... Use As Directed 12)  Freestyle Lite   Strp (Glucose Blood) .... Use As Directed 13)  Centrum   Tabs (Multiple Vitamins-Minerals) .Marland Kitchen.. 1 Tablet By Mouth Once Daily 14)  Famotidine 40 Mg  Tabs (Famotidine) .... One By Mouth Daily 15)  Metformin Hcl 500 Mg Tabs (Metformin Hcl) .... One By Mouth Two Times A Day 16)  Topamax 25 Mg Tabs (Topiramate) .... Two Pills By Mouth At Bedtime 17)  Baclofen 10 Mg Tabs (Baclofen) .... As Needed For [facility]ache [facility] 18)  Lidoderm 5 % Ptch (Lidocaine) .... [facility] 19)  Tramadol Hcl 50 Mg  Tabs (Tramadol Hcl) .... One By Mouth Q6h As Needed Back Pain 20)  Antivert 25 Mg Tabs (Meclizine Hcl) .... One By Mouth Q6h As Needed Dizziness 21)  Tessalon 200 Mg Caps (Benzonatate) .... One By Mouth Q6h As Needed Cough 22)  Align  Caps (Probiotic Product) .... Take 1 Capsule Twice Daily 23)  Diclofenac  Potassium 50 Mg Tabs (Diclofenac Potassium) .... One By Mouth Q8h As Needed Pain.  No More Than 2 Days Per Week. 24)  Metaxalone 800 Mg Tabs (Metaxalone) .... One By Mouth Qid As Needed Migraine [facility]. 25)  Omeprazole 40 Mg Cpdr (Omeprazole) .... Take One Every Day.  Stop Taking It If You Have Problems With Headaches. 26)  Cambia 50 Mg Pack (Diclofenac Potassium) .... [facility] 27)  Cipro 500 Mg Tabs (Ciprofloxacin Hcl) .Marland Kitchen.. 1 Tab By Mouth Two Times A Day X 7 Days For Bowel ? Infection 28)  Metronidazole 500 Mg Tabs (Metronidazole) .... Take 1 Pill Every 8 Hours For 7 Days  Allergies (verified): 1)  ! Hydrocodone-Acetaminophen (Hydrocodone-Acetaminophen) 2)  Amoxicillin (Amoxicillin) 3)  Azithromycin (Azithromycin) 4)  Clindamycin Hcl (Clindamycin Hcl) 5)  Codeine Phosphate  (Codeine Phosphate) 6)  Demerol (Meperidine Hcl) 7)  Etodolac (Etodolac) 8)  Imitrex (Sumatriptan) 9)  Naprosyn (Naproxen) 10)  Neurontin (Gabapentin) 11)  Sulfamethoxazole (Sulfamethoxazole) 12)  Talwin Nx  Social History: Smoking Status:  quit  Review of Systems        vitals reviewed and pertinent negatives and positives seen in HPI   Physical Exam  General:  Well-developed,well-nourished,in no acute distress; alert,appropriate and cooperative throughout examination, pt does not appear to be in pain now.  Lungs:  Normal respiratory effort, chest expands symmetrically. Lungs are clear to auscultation, no crackles or wheezes. Heart:  Normal rate and regular rhythm. S1 and S2 normal without gallop, murmur, click, rub or other extra sounds. Abdomen:  Bowel sounds positive,abdomen soft and non-tender without masses, organomegaly or hernias noted. Extremities:  trace lower ext edema Psych:  Cognition and judgment appear intact. Alert and cooperative with normal attention span and concentration. No apparent delusions, illusions, hallucinations.    Impression & Recommendations:  Problem # 1:  ABDOMINAL PAIN, LEFT LOWER QUADRANT (ICD-789.04) Assessment New Pt has LLQ pain that seems to radiate toward the pubic symphosis. She says the pain comes a goes. It does not appear to be ovarian torsion, it may be GI related. Plan to treat as if this is early diverticulitis with cipro and flagyl, and will also restart her Align probiotics. Pt has an appt with Dr. [name] for Jan 4th. She agrees to come in, call or go to the ED if she starts to get worse.   Orders: [facility]- Est Level  3 (57846)  Complete Medication List: 1)  Albuterol 90 Mcg/act Aers (Albuterol) .... Inhale 2 puff using inhaler every four hours 2)  Aspirin Childrens 81 Mg Chew (Aspirin) .... Take 1 tablet by mouth once a day 3)  Atenolol 100 Mg Tabs (Atenolol) .... Take 1/2 tablet by mouth daily 4)  Cyclobenzaprine Hcl 10 Mg Tabs  (Cyclobenzaprine hcl) .... Take 1 tablet by mouth every night 5)  Enalapril Maleate 10 Mg Tabs (Enalapril maleate) .... Take 1/2  tablet by mouth once a day 6)  Flovent Hfa 220 Mcg/act Aero (Fluticasone propionate  hfa) .... Inhale 1 puff as directed twice a day 7)  Loratadine 10 Mg Tabs (Loratadine) .Marland Kitchen.. 1 tablet by mouth once a day 8)  Simvastatin 20 Mg Tabs (Simvastatin) .... Take 1 tablet by mouth every night 9)  Triamcinolone Acetonide 0.5 % Crea (Triamcinolone acetonide) .... Apply to affected areas bid 10)  Nortriptyline Hcl 50 Mg Caps (Nortriptyline hcl) .... One by mouth two times a day 11)  Freestyle Lancets Misc (Lancets) .... Use as directed 12)  Freestyle Lite Strp (Glucose blood) .Marland KitchenMarland KitchenMarland Kitchen  Use as directed 13)  Centrum Tabs (Multiple vitamins-minerals) .Marland Kitchen.. 1 tablet by mouth once daily 14)  Famotidine 40 Mg Tabs (Famotidine) .... One by mouth daily 15)  Metformin Hcl 500 Mg Tabs (Metformin hcl) .... One by mouth two times a day 16)  Topamax 25 Mg Tabs (Topiramate) .... Two pills by mouth at bedtime 17)  Baclofen 10 Mg Tabs (Baclofen) .... As needed for headache per headache center 18)  Lidoderm 5 % Ptch (Lidocaine) .... Per headache center 19)  Tramadol Hcl 50 Mg Tabs (Tramadol hcl) .... One by mouth q6h as needed back pain 20)  Antivert 25 Mg Tabs (Meclizine hcl) .... One by mouth q6h as needed dizziness 21)  Tessalon 200 Mg Caps (Benzonatate) .... One by mouth q6h as needed cough 22)  Align Caps (Probiotic product) .... Take 1 capsule twice daily 23)  Diclofenac Potassium 50 Mg Tabs (Diclofenac potassium) .... One by mouth q8h as needed pain.  no more than 2 days per week. 24)  Metaxalone 800 Mg Tabs (Metaxalone) .... One by mouth qid as needed migraine per headache center. 25)  Omeprazole 40 Mg Cpdr (Omeprazole) .... Take one every day.  stop taking it if you have problems with headaches. 26)  Cambia 50 Mg Pack (Diclofenac potassium) .... Per headache center 27)  Cipro 500 Mg Tabs  (Ciprofloxacin hcl) .Marland Kitchen.. 1 tab by mouth two times a day x 7 days for bowel ? infection 28)  Metronidazole 500 Mg Tabs (Metronidazole) .... Take 1 pill every 8 hours for 7 days  Other Orders: Urinalysis-[facility] (00000) CBC-[facility] (78295)  Patient Instructions: 1)  Your urine only has a rare bacterial and no blood.  2)  I will have you set up for an ultrasound of your left abdomen.  3)  Call us or go to the ER if you get any worse.  Prescriptions: METRONIDAZOLE 500 MG TABS (METRONIDAZOLE) take 1 pill every 8 hours for 7 days  #21 x 0   Entered and Authorized by:   Jamie Brookes MD   Signed by:   Jamie Brookes MD on 07/25/2010   Method used:   Electronically to        [facility]* (retail)       [address] [place], [place]  [address]       Ph: [phone]       Fax: [phone]   RxID:   [id number] CIPRO 500 MG TABS (CIPROFLOXACIN HCL) 1 tab by mouth two times a day x 7 days for bowel ? infection  #14 x 0   Entered and Authorized by:   Jamie Brookes MD   Signed by:   Jamie Brookes MD on 07/25/2010   Method used:   Electronically to        [facility]* (retail)       [address] [place], [place]  [address]       Ph: [phone]       Fax: [phone]   RxID:   [id number] ALIGN  CAPS (PROBIOTIC PRODUCT) take 1 capsule twice daily  #60 x 4   Entered and Authorized by:   Jamie Brookes MD   Signed by:   Jamie Brookes MD on 07/25/2010   Method used:   Electronically to        [facility]* (retail)  [address] [place], [place]  [address]       Ph: [phone]       Fax: [phone]   RxID:   [id number]    Orders Added: 1)  Urinalysis-[facility] [00000] 2)  CBC-[facility] [85027] 3)  [facility]- Est Level  3 [99213]    Laboratory Results   Urine Tests  Date/Time Received: July 25, 2010 4:36 PM  Date/Time Reported: July 25, 2010 5:27 PM   Routine  Urinalysis   Color: yellow Appearance: Clear Glucose: negative   (Normal Range: Negative) Bilirubin: negative   (Normal Range: Negative) Ketone: negative   (Normal Range: Negative) Spec. Gravity: 1.015   (Normal Range: 1.003-1.035) Blood: negative   (Normal Range: Negative) pH: 7.0   (Normal Range: 5.0-8.0) Protein: negative   (Normal Range: Negative) Urobilinogen: 0.2   (Normal Range: 0-1) Nitrite: negative   (Normal Range: Negative) Leukocyte Esterace: trace   (Normal Range: Negative)  Urine Microscopic WBC/HPF: rare Bacteria/HPF: trace Epithelial/HPF: 0-2    Comments: ...............test performed by......Marland KitchenBonnie A. [place], MLS (ASCP)cm

## 2010-09-12 NOTE — Consult Note (Signed)
Summary: [facility]  [facility]   Imported By: Bradly Bienenstock 08/09/2010 09:14:25  _____________________________________________________________________  External Attachment:    Type:   Image     Comment:   External Document

## 2010-09-17 ENCOUNTER — Ambulatory Visit
Admission: RE | Admit: 2010-09-17 | Discharge: 2010-09-17 | Disposition: A | Discharge status: Home / Self Care | Payer: MEDICARE | Source: Ambulatory Visit | Attending: Chiropractic Medicine | Admitting: Chiropractic Medicine

## 2010-09-17 ENCOUNTER — Other Ambulatory Visit: Payer: Self-pay | Admitting: Chiropractic Medicine

## 2010-09-17 DIAGNOSIS — M542 Cervicalgia: Secondary | ICD-10-CM

## 2010-09-17 DIAGNOSIS — M25519 Pain in unspecified shoulder: Secondary | ICD-10-CM

## 2010-09-19 ENCOUNTER — Other Ambulatory Visit: Payer: Self-pay | Admitting: Family Medicine

## 2010-09-19 DIAGNOSIS — Z1239 Encounter for other screening for malignant neoplasm of breast: Secondary | ICD-10-CM

## 2010-10-22 ENCOUNTER — Encounter: Payer: Self-pay | Admitting: Home Health Services

## 2010-10-23 ENCOUNTER — Other Ambulatory Visit: Payer: Self-pay | Admitting: Family Medicine

## 2010-10-23 MED ORDER — ATENOLOL 100 MG PO TABS: 50.0000 mg | ORAL_TABLET | Freq: Every day | ORAL | Status: DC

## 2010-10-28 ENCOUNTER — Ambulatory Visit
Admission: RE | Admit: 2010-10-28 | Discharge: 2010-10-28 | Disposition: A | Discharge status: Home / Self Care | Payer: MEDICARE | Source: Ambulatory Visit | Attending: Family Medicine | Admitting: Family Medicine

## 2010-10-28 DIAGNOSIS — Z1239 Encounter for other screening for malignant neoplasm of breast: Secondary | ICD-10-CM

## 2010-11-23 ENCOUNTER — Other Ambulatory Visit: Payer: Self-pay | Admitting: Family Medicine

## 2010-11-25 NOTE — Telephone Encounter (Signed)
Refill request

## 2010-11-28 ENCOUNTER — Ambulatory Visit: Payer: MEDICARE | Admitting: Home Health Services

## 2010-12-11 ENCOUNTER — Other Ambulatory Visit: Payer: Self-pay | Admitting: Family Medicine

## 2010-12-11 NOTE — Telephone Encounter (Signed)
Refill request

## 2010-12-12 ENCOUNTER — Ambulatory Visit: Payer: MEDICARE | Admitting: Home Health Services

## 2010-12-24 NOTE — H&P (Signed)
[name], [name] NO.:  [id number]   MEDICAL RECORD NO.:  [id number]          PATIENT TYPE:  INP   LOCATION:  2021                         FACILITY:  [facility]   PHYSICIAN:  Nestor Ramp, MD        DATE OF BIRTH:  12-24-1940   DATE OF ADMISSION:  05/27/2007  DATE OF DISCHARGE:                              HISTORY & PHYSICAL   CHIEF COMPLAINT:  Chest pressure.   PRIMARY CARE PHYSICIAN:  Dr. Santiago Bumpers. Hensel, M.D., of [facility].   HISTORY OF PRESENT ILLNESS:  This is a 70 year old female patient of Dr.  Cyndia Skeeters, with history of diabetes and hypertension, who presents to the  ER with chest pressure.  The patient states pressure started yesterday  while watching TV at night.  She was sitting on the sofa, and it started  suddenly.  At first she thought it was asthma related given her history  of asthma, but it did not let up.  The patient went to bed and pressure  did not improve and said it was constant and worsened.  Pressure  initially was substernal, but then became a sharp pain in the left chest  without radiation.  Not exertional, not relieved by rest, but relieved  by nitroglycerin.  No nausea, vomiting, diaphoresis or shortness of  breath noted.  Not positional.  The patient woke up today and still had  pain.  Took 2 aspirins at home and called the [facility],  who recommended that she come into the ER for evaluation.  Nitroglycerin  given in the ED relieved pain.  The patient, on presentation, had chest  pain an 8/10.  After nitro, came down to 4/10, but still there.  Of  note, the patient has extensive family history of coronary artery  disease and mother who died of MI at age 17, a sister with MI status  post CABG at age 29 and a brother with a CVA.  The patient also has a  remote history of smoking, and she quit in 1979.   REVIEW OF SYSTEMS:  No fevers, chills, rashes, urinary changes, bowel  movement changes, otherwise see HPI.   Otherwise negative.   PAST MEDICAL HISTORY:  1. Diabetes.  2. Hypertension.  3. GERD.  4. Hyperlipidemia.  5. Thrombophlebitis.   FAMILY HISTORY:  Of heart disease.  Both parents passed away in their  105s with MI, sister had an MI at age 2 and then had CABG, and her  brother had a CVA.   MEDICATIONS:  1. Albuterol 2 puffs every 4 hours.  2. Aspirin 81 daily.  3. Atenolol 100 mg b.i.d.  4. Benzonatate 200 mg, 1 b.i.d.  5. Cimetidine 400 mg, 1 b.i.d.  6. Cyclobenzaprine HCL 10 mg, 1 q.h.s.  7. Enalapril 10 mg, 1 daily.  8. Flovent HFA 220 mcg, 1 b.i.d.  9. Hydrochlorothiazide 12.5 mg, 1 daily.  10.Loratadine 1 daily, 10 mg.  11.Meclizine 25 mg, 1 every 6 hours.  12.Metformin 500 mg, 1 b.i.d.  13.Potassium chloride 10 mEq, 1 daily.  14.Darvocet 100/650 mg, 1  five times a day.  15.Simvastatin 20 mg, 1 q.h.s.  16.Triamcinolone, apply to affected areas b.i.d.  17.Tramadol 50 mg, 1 every 6 hours.  18.Nortriptyline 25 mg, use as directed.   ALLERGIES:  1. AMOXICILLIN.  2. AZITHROMYCIN.  3. CLINDAMYCIN.  4. CODEINE.  5. DEMEROL.  6. ETODOLAC.  7. IMITREX.  8. NAPROSYN.  9. NEURONTIN.  10.SULFAMETHOXAZOLE.  11.TALWIN.   SOCIAL HISTORY:  The patient has a remote history of smoking, quit in  1979.  The patient denies alcohol or recreational drugs.  The patient  lives with husband currently.  Has several children in the area and  grandchildren as well.  The patient is currently disabled secondary to  degenerative joint disease.   PHYSICAL EXAMINATION:  VITALS:  Temperature 98.4, respiratory of 15,  heart rate 75, blood pressure 146/66, with range in the ER of 88 to 151  over 60 to 84, O2 saturations 95% on room air.  GENERAL:  Well-developed, well-nourished female in no acute distress,  laying in bed.  HEENT:  Normocephalic, atraumatic.  Pupils equally round and reactive to  light.  Extraocular movements intact.  Moist mucous membranes.  No  pharyngeal erythema, edema  or exudates.  NECK:  Supple.  No lymphadenopathy.  No bruits auscultated.  CARDIOVASCULAR:  Normal S1, S2.  No murmurs, rubs or gallops  appreciated.  PULMONARY:  Clear to auscultation bilaterally with no crackles or  wheezing, nontender to palpation chest.  ABDOMEN:  Soft, nontender, nondistended.  Mildly tender to the  epigastrium palpation.  No hepatosplenomegaly.  Normoactive bowel  sounds.  EXTREMITIES:  No clubbing, cyanosis or edema, 2+ peripheral pulses.  SKIN:  No rash or jaundice noted.   LABORATORY DATA:  BNP 53, sodium 140, potassium 3.6, chloride 103,  bicarb 27, BUN 20, creatinine 1.09, glucose 83, calcium 9.5, total  protein 6.9, albumin 4.1, AST 41, ALT 36.  White blood cell 6.2,  hemoglobin 12.4, hematocrit 37.1, platelets 164, neutrophils 67%,  lymphocytes 23%.  Urinalysis negative.   The patient given a nitro drip in the ER and an aspirin, total of 325  mg.   EKG:  Normal sinus rhythm.  No ischemic changes.   Chest x-ray pending.   ASSESSMENT/PLAN:  This is a 70 year old female with history of  hypertension, diabetes and family history of coronary artery disease,  who presents with a 1 day history of chest pressure.  1. Atypical chest pain.  The patient is a medium risk for coronary      artery disease given risk factors.  We will admit.  Rule out with      cardiac enzyme cycling and repeat EKG in the morning.  Monitor      overnight on telemetry, cardiology  consult in the morning.  The      patient requested Dr. Donnie Aho, as he is her husband's cardiologist.      Currently on nitro drip.  We will also start a heparin drip.  Hold      off on Plavix load for now.  Supplemental oxygen as needed.  2. Hypertension.  Continue beta blocker, enalapril and      hydrochlorothiazide.  Monitor with vitals.  3. Diabetes.  Hold metformin.  Cover with sliding scale insulin.      Check hemoglobin A1c.  Last one, in clinic a month ago, was 6.7%.  4. Hyperlipidemia.  Continue  Zocor or change to Lipitor formulary.  5. FEN/GI.  Peripheral IV.  No IV fluids for now.  Continue heart-      healthy/__________ -modifieddiet.  6. Prophylaxis.  Start heparin drip for DVT prophylaxis, Protonix for      GI prophylaxis.  7. GERD.  Protonix.  8. Disposition.  Admit, monitor overnight on telemetry and cardiology      consult in the morning for further workup as an outpatient versus      inpatient.      Eustaquio Boyden, MD  Electronically Signed      Nestor Ramp, MD  Electronically Signed    JG/MEDQ  D:  05/27/2007  T:  05/28/2007  Job:  [id number]

## 2010-12-24 NOTE — Consult Note (Signed)
NAMEMARNA, WENIGER NO.:  [id number]   MEDICAL RECORD NO.:  [id number]          PATIENT TYPE:  INP   LOCATION:  2021                         FACILITY:  [facility]   PHYSICIAN:  Georga Hacking, M.D.DATE OF BIRTH:  [birth year]   DATE OF CONSULTATION:  05/28/2007  DATE OF DISCHARGE:                                 CONSULTATION   REASON FOR CONSULTATION:  Chest pain.   I was asked to see this 70 year old female for evaluation of chest  discomfort.  The patient has multiple cardiovascular risk factors  including a history of non-insulin-dependent diabetes, hypertension,  hyperlipidemia, obesity, reflux esophagitis, osteoarthritis, asthma and  migraine headaches.  She has previously been in recently good health but  several days ago had the onset of anterior substernal tightness at rest  at night.  She was concerned it might be her asthma but did not feel  that she was wheezing.  The symptoms did seem to resolve somewhat.  Since that time, she has had some left-sided chest discomfort described  as tightness or heaviness with radiation to the lateral aspect of her  left arm which is different from previous shoulder pain that she had  that she described as an ache.  The discomfort occurred the day before  yesterday and began to radiate like a weight.  It was not worse with  exertion, was constant through the night, and she presented to the  emergency room where it was relieved with a nitroglycerin.  She did  complain of some very vague discomfort since then.  She has been  maintained on nitroglycerin which is causing a severe headache and has  noted increased belching since the pain began.   PAST MEDICAL HISTORY:  Remarkable for:  1. Hypertension.  2. Hyperlipidemia.  3. Diabetes mellitus.  4. Reflux esophagitis.  5. Osteoarthritis.  6. Migraines.  7. Asthma.  8. Superficial thrombophlebitis.   ALLERGIES:  CODEINE, DEMEROL, TALWIN, PENICILLIN and SULFA.   PAST  SURGICAL HISTORY:  1. Appendectomy.  2. Hysterectomy.   CURRENT MEDICATIONS:  1. Aspirin 81 mg daily.  2. Metformin 500 mg b.i.d.  3. Albuterol p.r.n.  4. Atenolol 100 b.i.d.  5. Tagamet 300 mg.  6. Enalapril 10 mg daily.  7. Flovent 220 mg inhaler b.i.d.  8. Hydrochlorothiazide 12.5 mg daily.  9. Simvastatin 20 mg nightly.  10.Nortriptyline 25 mg p.o. nightly.  11.Aspirin.  12.Heparin.   FAMILY HISTORY:  Father and mother both had coronary artery disease in  their 17s.  A sister had a heart attack and had bypass grafting last  year of  age 46.  A brother has had a stroke.   SOCIAL HISTORY:  She currently lives with her husband who is a patient  of mine. She moved here from the [place]. She quit smoking in 1979 and  does not use alcohol to excess.   REVIEW OF SYSTEMS:  She has significant chronic low back pain.  She has  a history of significant migraine headaches.  She has a history of  esophageal reflux and has been placed on Protonix in the past.  She does  not have any diabetic retinopathy or eye problems.  She has a history of  severe headaches as noted.  She has lost a little bit of weight recently  but has remained obese. She has indigestion and dyspepsia. She has not  had any change in the color of her stools. She has chronic asthma and  has moderate dyspnea with exertion.  She has no dysuria or urgency. She  does have some arthritis and has had previous surgery involving the  carpal tunnel and tendons of her hands.  She has had no history of  stroke or TIA.  Other than as noted above, the remainder of the systems  is unremarkable.   PHYSICAL EXAMINATION:  GENERAL:  She is an obese, pleasant woman,  appearing stated age in no acute distress.  VITAL SIGNS:  Blood pressure as 132/70, oxygen saturations  96%.  SKIN:  Warm and dry.  HEENT:  ENT is unremarkable.  Conjunctivae and sclerae are clear.  Fundi  not examined.  Pharynx negative.  NECK:  Supple without masses,  JVD, thyromegaly or bruits.  LUNGS:  Clear to auscultation and percussion.  CARDIAC:  Normal S1 and S2, no S3.  ABDOMEN:  Soft and nontender.  EXTREMITIES:  Femoral and distal pulses are 2+.  NEUROLOGIC:  Normal.   EKG shows poor R wave progression in V2 and V3.   Laboratory data shows normal creatinine BUN of 16 and normal creatinine  of 0.96. Hemoglobin is 10.9, hematocrit 33.  Admission hemoglobin was  12.4, hematocrit was 37.1. Platelet count is 131,000, was 164,000. SGOT  is 41.  SGPT is 36. Hemoglobin A1c is 6.9.  CPK-MB and troponins were  negative.  Cholesterol is 126 with LDL cholesterol of 51.  HDL  cholesterol 48, triglycerides of 137.   IMPRESSION:  1. Chest discomfort with features suggestive of unstable angina but      still some atypical features. The patient has multiple      cardiovascular risk factors.  Her EKG and enzymes are unremarkable.  2. Hypertension.  3. Hyperlipidemia.  4. History of asthma.  5. Obesity.  6. History of reflux.  7. History of chronic back pain.  8. History of subdural hematoma 3 years ago due to head injury.   RECOMMENDATIONS:  At this point in time, the patient is anticoagulated  and is receiving IV nitroglycerin.  Her enzymes were negative.  MI has  been ruled out.  She has multiple cardiovascular risk factors and a  suggestive history with relief of the pain with nitroglycerin. I would  favor going directly to cardiac catheterization and coronary  arteriography to determine if she has significant coronary artery  disease as cause of the current symptomatology. She has had a slight  drop in her hemoglobin.  It is of importance to know whether she can be  continued on anticoagulation or if it needs to be stopped.  She is  willing to proceed, and I have discussed the catheterization procedure  with her in detail including risks of myocardial infarction, death,  stroke, bleeding, arrhythmia, dye allergy or renal insufficiency.  She   understands and is willing to proceed.      Georga Hacking, M.D.  Electronically Signed     WST/MEDQ  D:  05/28/2007  T:  05/30/2007  Job:  [id number]   cc:   William A. [name], M.D.

## 2010-12-24 NOTE — Discharge Summary (Signed)
[name], [name] NO.:  [id number]   MEDICAL RECORD NO.:  [id number]          PATIENT TYPE:  INP   LOCATION:  2021                         FACILITY:  [facility]   PHYSICIAN:  Leighton Roach McDiarmid, M.D.DATE OF BIRTH:  07-25-1941   DATE OF ADMISSION:  05/27/2007  DATE OF DISCHARGE:  05/29/2007                               DISCHARGE SUMMARY   PRIMARY CARE PHYSICIAN:  Dr. Doralee Albino at the [facility]   DISCHARGE DIAGNOSES:  1. Chest pain.  2. Hypertension.  3. Diabetes mellitus type 2.  4. Hyperlipidemia.  5. Thrombocytopenia.   DISCHARGE MEDICATIONS:  1. Glucophage 500 mg p.o. b.i.d.  2. Enalapril 10 mg p.o. daily.  3. Atenolol p.o. b.i.d.  4. Hydrochlorothiazide 12.5 mg p.o. daily.  5. Potassium chloride 10 mEq p.o. daily.  6. Tagamet 400 mg p.o. b.i.d.  7. Propoxyphene/APAP (100/650) every four to six hours p.o. p.r.n.      back pain.  8. Flexeril 10 mg p.o. q.h.s.  9. Zocor 20 mg p.o. q.h.s.  10.Nortriptyline 25 mg three pills q.h.s.  11.Claritin 10 mg p.o. daily.  12.Albuterol two puffs every four hours as needed for shortness of      breath.  13.Flovent 220 mcg one puff b.i.d.  14.Compazine 10 mg p.o. q.6 hours p.r.n. migraine, nausea, vomiting.   The patient is to stop taking aspirin.   CONSULT:  Dr. Donnie Aho in cardiology   PROCEDURES:  1. Echocardiogram on May 28, 2007.  2. Cardiac catheterization on May 28, 2007.  3. EKG on May 27, 2007.  4. Chest x-ray on May 27, 2007.   LABS:  Point-of-care cardiac enzymes were drawn on admission on May 27, 2007.  Troponin I was less than 0.05, CK-MB 1.8.  B-natruretic  peptide on May 27, 2007 was 53.  Troponin I on May 27, 2007 at  2110 was 0.01 with a CK of 63 and a CK-MB of 1.8.  Repeat troponin I on  May 28, 2007 at 0303 a.m. was 0.01 with a creatinine kinase of 69  and a CK-MB of 1.8.  Repeat troponin I on May 28, 2007 at 1305 was  0.01  with a CK of 70 and a CK-MB of 1.9.  D-dimer on May 28, 2007  was 0.042.  Lipid profile on May 28, 2007:  Triglycerides 137, HDL  48, LDL 51.  Hemoglobin A1c on May 28, 2007:  6.9.  Heparin level on  May 28, 2007:  0.30.  BMP on October 18, 20008:  Sodium 140,  potassium 4.0, chloride 104, bicarb 30, BUN 12, creatinine 0.95, glucose  96, calcium 9.0.  CBC on May 29, 2007:  White blood count 5.8,  hemoglobin 11.5, hematocrit 34.7.  Platelet count on May 29, 2007  was 128; this is down from her admission platelet count on May 27, 2007 of 164; this is a new-onset thrombocytopenia.  PT on discharge was  13.2 with an INR of 1.0 and a PTT of 26.   BRIEF HOSPITAL COURSE:  This is a 70 year old female admitted with chest  pain on May 27, 2007.   1. Atypical angina.  This is an ex-smoker with risk factors including      diabetes mellitus, hyperlipidemia and hypertension.  The following      studies were performed:  She had a normal EKG, a normal      echocardiogram that demonstrated a left ventricular ejection      fraction of 70% with mild left atrial dilatation, a normal cardiac      catheterization that demonstrated normal coronary arteries, normal      cardiac enzymes x3, a normal D-dimer and a normal chest x-ray.  We      ruled her out for acute life-threatening causes including      myocardial infarction and acute coronary syndrome, aortic      dissection and pneumothorax.  Pulmonary embolus is essentially      ruled out with a negative D-dimer at this point.  She is to follow      up with her primary care physician for other causes of chest pain.  2. Hypertension.  Continued her atenolol and hydrochlorothiazide and      enalapril throughout the admission.  3. Diabetes mellitus.  We held her metformin on admission and covered      her blood sugars with sliding-scale insulin.  We drew an A1c which      was 6.9.  4. Hyperlipidemia.  Her simvastatin was  continued at 20 mg per day      during admission.  5. She was maintained on a proton pump inhibitor as an inpatient, but      was discharged on Tagamet, an H2-blocker.  6. Thrombocytopenia.  Bruising was noted on the morning of discharge.      A 1 cm bruise under the left orbit was noted after the patient had      rubbed her eye.  She also had a several centimeter bruise on her      right calf.  The patient describes this as a long-term problem for      her.  We drew a CBC to determine her platelets status and found it      had dropped throughout this admission as noted in the labs section      above.  We also checked her INR/PT and PTT.  A HIT panel was      ordered on the last day of admission and should be followed up as      an outpatient.   DISCHARGE INSTRUCTIONS:  1. The patient is discharged on a heart-healthy diet with no      limitations on her activity.  2. She should follow up with Dr. Donnie Aho within one week and with Dr.      [name] within one week as well.  3. Results of the HIT panel are pending and should be followed as an      outpatient.   The patient was discharged to home in stable medical condition.      Romero Belling, MD  Electronically Signed      Leighton Roach McDiarmid, M.D.  Electronically Signed    MO/MEDQ  D:  05/29/2007  T:  05/30/2007  Job:  [id number]   cc:   William A. [name], M.D.  Georga Hacking, M.D.

## 2010-12-24 NOTE — Cardiovascular Report (Signed)
[name], [name]               ACCOUNT NO.:  [id number]   MEDICAL RECORD NO.:  [id number]          PATIENT TYPE:  OBV   LOCATION:  2021                         FACILITY:  [facility]   PHYSICIAN:  Georga Hacking, M.D.DATE OF BIRTH:  [birth year]   DATE OF PROCEDURE:  06/28/2007  DATE OF DISCHARGE:  05/29/2007                            CARDIAC CATHETERIZATION   HISTORY:  The 70 year old female has a previous history of multiple  cardiovascular risk factors who presented with several days of anterior  substernal tightness at rest.  She was concerned it might be her asthma  but was not wheezing.  She has had left-sided chest discomfort radiating  to the lateral aspect of her left arm, different from the previous  shoulder pain she had which she described as an ache.  The discomfort  occurred and she was admitted to rule out a myocardial infarction.  Please see previously dictated history and physical for the remainder of  the details.   HOSPITAL COURSE:  The patient's pain was relieved with nitroglycerin and  I was asked to see her in consultation.  It was decided in view of her  multiple risk factors, and inability to exercise that we would proceed  directly to catheterization.   PROCEDURE:  Left heart catheterization with coronary angiograms and left  ventriculogram,.   COMMENTS ABOUT PROCEDURE:  The patient tolerated the procedure well  without complication.  She had good hemostasis and good peripheral  pulses noted at the end of the procedure.   HEMODYNAMIC DATA:  Aorta postcontrast 144/66.  LV postcontrast 144/7-20.   ANGIOGRAPHIC DATA:  Left ventriculogram:  Performed in the 30 degrees  RAO projection.  The aortic valve was normal.  The mitral valve was  normal.  Left ventricle was normal in size.  Estimated ejection fraction  is 70%.  Coronary arteries arise and distribute normally.  The system  was left dominant.  There was no significant coronary artery  calcification  noted.  Coronary arteries appeared smooth without  significant stenoses noted.   IMPRESSION:  1. Normal left ventricular function with no evidence of infarction.  2. No significant coronary artery disease identified.  No significant      coronary calcification.  3. Mild elevation of left ventricular end-diastolic pressure.   RECOMMENDATIONS:  Evaluate for other causes of pain.  Could consider a  CT angiogram to look for pulmonary embolus or dissection.  Modify  cardiac risk factors.  Review echocardiogram.      Georga Hacking, M.D.  Electronically Signed    WST/MEDQ  D:  06/30/2007  T:  07/01/2007  Job:  [id number]   cc:   William A. [name], M.D.

## 2010-12-27 NOTE — Discharge Summary (Signed)
[name], ROCKERS                           ACCOUNT NO.:  [id number]   MEDICAL RECORD NO.:  [id number]                   PATIENT TYPE:  INP   LOCATION:  3029                                 FACILITY:  [facility]   PHYSICIAN:  Santiago Bumpers. Hensel, M.D.             DATE OF BIRTH:  10/19/1940   DATE OF ADMISSION:  09/04/2003  DATE OF DISCHARGE:  09/08/2003                                 DISCHARGE SUMMARY   CONSULTATIONS:  Tia Alert, M.D.   DISCHARGE DIAGNOSES:  1. Subdural hematoma associated with headache.  2. History of migraine.  3. History of hypertension.   DISCHARGE MEDICATIONS:  1. Medrol Dosepak take as indicated by pharmacy. This is prescribed by Tia Alert, M.D.  2. Percocet 10/325 one or two by mouth every six hours as needed for pain.     Try these only if Darvocet does not relieve pain.  3. Darvocet-N 100 one or two by mouth every four to six hours as needed for     pain.  Please try these first.  4. Atarax 50 mg one p.o. q.6-8h as needed for itching.   HISTORY OF PRESENT ILLNESS:  This is a 70 year old white female patient of  Dr. [name], who reported to [facility] emergency department on  September 04, 2003, complaining that she had hit her head on the ice during a  fall two days prior to admission.  She was seen at [facility] complaining of a severe headache on that same day.  She had  a CT which revealed a subdural hematoma of modest size without mass effect.  Arrangements made were close follow-up as an outpatient, but she developed  nausea and vomiting in addition to her persistent headache and came to the  ER to be admitted on September 04, 2003.  The patient denied loss of  consciousness associated with this fall, denied loss of bowel or bladder  continence with this fall.  She did admit to loose stool x1.  She denied  localized weakness, but complained of generalized fatigue.  Family members  report that the patient has  had increased confusion, but no difficulty with  speech.  The patient denied double vision, denied dysphagia, denied rashes,  denied chest pain, denied shortness of breath.  She did admit to chills and  rigors on the day of admission only.   ADMISSION PHYSICAL EXAMINATION:  VITAL SIGNS:  Temperature 98, respirations  16, pulse 75, blood pressure 150/84.  99% O2 saturation on room air.  GENERAL:  This is a middle-aged white female in moderate distress secondary  to pain.  HEENT:  Her pupils equal, round, and reactive to light.  Fundi were within  normal limits.  Optic discs clean.  Clear, sharp margins.  The remainder of  HEENT examination within normal limits.  LUNGS:  Clear to auscultation bilaterally.  Good  respiratory effort.  No  wheezes, crackles, or rhonchi.  HEART:  Regular rate and rhythm, no murmurs, rubs, or gallops.  No  peripheral edema.  ABDOMEN:  Benign.  NEUROLOGY:  Cranial nerves II-XII grossly intact.  There is some indication  in the record that the patient reports being heme positive at the time of  admission, but this was not actually done at admission.  I am not sure what  that means.   Urine drug screen at the time of admission was negative for Acetaminophen  and aspirin.  Was negative otherwise also.   LABORATORY DATA:  CBC and BMET were drawn, but not available at the time of  admission.  Eventually her CBC came back; white count 6.8, hemoglobin 12.8,  hematocrit 37.2, platelets 133.  PT/INR was 13 and 1.0, respectively.  PTT  28.  Sodium 134, potassium 3.7, chloride 101, bicarb 27, BUN 15, creatinine  0.7, glucose 123, total bilirubin 0.5, alkaline phosphatase 109, AST 26, ALT  23, total protein 6.2, albumin 3.5, calcium 9.0.   HOSPITAL COURSE:  Problem 1.  Subdural hematoma.  The patient had a repeat  CT scan which was stable and again showed acute subdural hematoma involving  the right cerebral hemisphere without substantial mass effect or midline  shift.   The patient was admitted and given IV fluid hydration and managed  with fentanyl IV and then transdermal.  She was given Zofran for nausea.  She was for a brief time given Oxycodone IR, but developed an itch following  administration of this medication, therefore, it was discontinued after one  day.  The patient was seen by Dr. Yetta Barre of [facility] who  confirmed the fact that the patient did not require surgical intervention  and that she could be observed.  He stated that once she was tolerating p.o.  pain medicines without nausea or vomiting and was able to control her pain  with p.o. pain medicines that she could be discharged.  He started her on IV  steroids and on the day of discharge she was given a prescription for  Medrol Dosepak to complete as an outpatient.  The patient was given Benadryl  and Atarax on a p.r.n. basis to relieve the itching associated with  administration of Oxycodone.  The patient's headache improved from a 10/10  at admission to 6 to 7 out of 10 the day following admission, to less than  5/10 on the day of discharge.  The patient was discharged with Darvocet-N  100.  The patient is sent home with Darvocet-N 100 two p.o. q.4-6h p.r.n.  and instructed to use this as a first line treatment for pain and then also  given a prescription for Percocet 10/325 one to two q.6h p.r.n. for pain as  a breakthrough measure.   Problem 2.  History of migraine.  On day #2 of hospital admission, the  patient complained of left-sided head pain that was different from the  initial pain associated with the subdural hematoma.  States that it felt  like a migraine. The patient was restarted on her home dose of Elavil 50 mg  p.o. q.h.s. and the pain eventually resolved.   Problem 3.  History of hypertension.  The patient's pressures were low upon  admission and actually never that alarming, but the day prior to admission she was restarted on her home medication of HCTZ  12.5 mg p.o. daily.   DISPOSITION:  The patient was seen by Dr. [name] on the  day of discharge and  by neurosurgeon, Dr. Yetta Barre.  All parties involved felt that the patient was  ready for discharge, much improved from the day of admission.  Of note, the  patient did report visual hallucinations the night prior to admission and  her description of these is not consistent with actual visual  hallucinations, but most likely due to excessive use of pain medications.  The patient was alert and oriented x3 at the time of discharge and denied  any additional hallucinations.  The patient is to follow up with Dr. [name]  for her previously scheduled appointment at [facility] on February 7, at 1:45 p.m.  Also to call Dr. Yetta Barre at [phone] at  [facility] for follow up in two weeks at which time she can have  another CT. The patient is to have an additional CT in six weeks to ensure  resolution of subdural hematoma.   PROCEDURE:  The patient had CT of the head without contrast x3, all of which  were stable and failed to show mass effect or midline shift.      Franklyn Lor, MD                            Santiago Bumpers. [name], M.D.   TD/MEDQ  D:  09/08/2003  T:  09/09/2003  Job:  [id number]   cc:   Tia Alert, MD  Fax: [phone]

## 2010-12-27 NOTE — Op Note (Signed)
NAME:  TAIS, KOESTNER                           ACCOUNT NO.:  [id number]   MEDICAL RECORD NO.:  [id number]                   PATIENT TYPE:  AMB   LOCATION:  ENDO                                 FACILITY:  [facility]   PHYSICIAN:  Anselmo Rod, M.D.               DATE OF BIRTH:  June 11, 1941   DATE OF PROCEDURE:  01/01/2004  DATE OF DISCHARGE:                                 OPERATIVE REPORT   PROCEDURE PERFORMED:  Esophagogastroduodenoscopy with antral biopsies.   ENDOSCOPIST:  Charna Elizabeth, M.D.   INSTRUMENT USED:  Olympus video panendoscope.   INDICATIONS FOR PROCEDURE:  The patient is a 70 year old white female with a  history of guaiac positive stools, epigastric pain and a previous history of  ulcer disease, rule out recurrent ulcers.   PREPROCEDURE PREPARATION:  Informed consent was procured from the patient.  The patient was fasted for eight hours prior to the procedure.   PREPROCEDURE PHYSICAL:  The patient had stable vital signs.  Neck supple,  chest clear to auscultation.  S1, S2 regular.  Abdomen soft with normal  bowel sounds.   DESCRIPTION OF PROCEDURE:  The patient was placed in the left lateral  decubitus position and sedated with 50 mcg of fentanyl and 7 mg of Versed  intravenously in slow incremental doses.  Once the patient was adequately  sedated and maintained on low-flow oxygen and continuous cardiac monitoring,  the Olympus video panendoscope was advanced through the mouth piece over the  tongue into the esophagus under direct vision.  The entire esophagus  appeared normal with no evidence of ring, stricture, masses, esophagitis or  Barrett's mucosa.  The scope was then advanced to the stomach.  Retroflexion  in the high cardia did not reveal any evidence of hiatal hernia.  Hemorrhagic duodenitis was seen in the bulb.  Small bowel distal to the bulb  appeared normal.  Antral erosions were noted.  There was atrophic gastritis.  Biopsies were done from the antrum  to rule out presence of Helicobacter  pylori by pathology.   IMPRESSION:  Antral erosions with atrophic appearing gastric mucosa.  Otherwise normal esophagogastroduodenoscopy.   RECOMMENDATIONS:  1. Await pathology results.  2. Treat with antibiotics if Helicobacter pylori present on biopsies.  3. Avoid all nonsteroidals for now.  4. Proton pump inhibitor of choice.  5. Proceed with a colonoscopy at this time.  Further recommendations to be     made after colonoscopy has been done.                                               Anselmo Rod, M.D.    JNM/MEDQ  D:  01/01/2004  T:  01/02/2004  Job:  [id number]   cc:   William A. [name], M.D.  Fax:  [phone] 

## 2010-12-27 NOTE — Op Note (Signed)
[facility]  Patient:    [name], [name] Visit Number: [id number] MRN: [id number]          Service Type: DSU Location: [facility] Attending Physician:  Ronne Binning Dictated by:   Nicki Reaper, M.D. Proc. Date: 12/14/01 Admit Date:  12/14/2001                             Operative Report  PREOPERATIVE DIAGNOSIS: Lunotriquetral tear, triangular fibrocartilage complex tear, right wrist.  POSTOPERATIVE DIAGNOSIS: Lunotriquetral tear, triangular fibrocartilage complex tear, right wrist, plus scapholunate partial tear on the carpal abutment.  OPERATION/PROCEDURE: Arthroscopy, debridement of triangular fibrocartilage complex, shrinkage of scapholunate, open ulnar shortening osteotomy, right wrist.  SURGEON: Nicki Reaper, M.D.  ASSISTANT: None.  ANESTHESIA: Axillary, general.  ANESTHESIOLOGIST: Janetta Hora. Gelene Mink, M.D.  HISTORY: The patient is a 70 year old female, with a history of ulnar wrist pain.  She has had MRI done revealing a TFCC tear, questionable on the carpal abutment, with a lunotriquetral injury.  PROCEDURE: The patient was brought to the operating room, where an axillary block was attempted.  This was not successful.  General anesthesia was given. She was prepped and draped using Betadine scrubbing solution with the right arm free, supine position.  The arthroscopy was performed first for inspection of the TFCC with the idea being that if there was a traumatic tear then debridement and pinning of the lunotriquetral would be treatment of choice. However, it was a normal carpal abutment and ulnar shortening osteotomy would be performed.  The joint was inflated through the 3-4 portal, the joint entered with a transverse incision, deepened with a hemostat.  A blunt trocar was used to enter the joint, protecting the EPL tendon.  The joint was inspected.  A partial tear of the scapholunate ligament was identified. Significant  synovitis was present with multiple fronds of tissue.  Volar radial wrist ligaments were intact.  The ulnar side showed intact volar ulnar ligament.  The TFCC showed a tear which appeared to be central rather than a peripheral or traumatic type tear.  The lunotriquetral ligament was torn. There were some changes in the head of the ulnar which was visible through the TFCC.  A 4-5 portal was opened.  A 6-U portal was used for irrigation.  The scope was then alternated.  An ArthroWand was inserted and debridement of the loose portions of the TFCC performed.  The volar ulnar ligaments were partially shrunk.  The scapholunate was then also shrunk.  The dorsal aspect was noted to be intact and it was decided not to proceed with mid carpal inspection.  The scope was alternated through 3-4, 4-5 to perform these functions.  The lunotriquetral showed a complete tear.  It was decided to proceed with an ulnar shortening osteotomy rather than debridement of LT and pinning.  The instruments were removed, portals left open.  The arm was exsanguinated with an Esmarch bandage.  The tourniquet placed high on the arm was inflated to 250 mm Hg.  Curvilinear incision was made over the ulnar aspect of the wrist, carried down through subcutaneous tissue, dorsal extensor branch of the ulnar nerve identified.  The interval between the extensor digiti quinti, extensor carpi ulnaris was opened and carried down to the ulna. The periosteum partially elevated, a six-hole DCP plate was then affixed distally with three 14 mm screws.  These were inserted and removed.  The periosteum was minimally elevated around  the area for the osteotomy site.  A line was made in the ulnar for rotation.  A segment of ulnar shaft was then removed, oblique in nature, using an oscillating saw, measuring approximately 3 mm.  The plate was reaffixed, the screws tightened.  The rotation alignment confirmed.  The proximal ulna was then clamped  and the proximal screws were then placed.  These measured 14 and two 12 mm screws.  These were each drilled, measured self-tapping screws were inserted.  The first screw was placed under compression, compressing the osteotomy together.  X-rays confirmed positioning of the plate, shortening of the ulna and the osteotomy site well coapted.  The wound was copiously irrigated with saline, the fascia closed with a running 4-0 Vicryl suture, the subcutaneous tissue closed with interrupted 4-0 Vicryl and the skin with a subcuticular 4-0 Monocryl suture. Steri-Strips were applied, sterile compressive dressing, and long arm splint applied.  The patient tolerated the procedure well and was taken to the recovery room for observation in satisfactory condition.  She is discharged home to return to the [facility] [place] in one week, on [place] and Septra DS.  She was given 1 g vancomycin while in the operating room. Dictated by:   Nicki Reaper, M.D. Attending Physician:  Ronne Binning DD:  12/14/01 TD:  12/15/01 Job: [id number]

## 2010-12-27 NOTE — Op Note (Signed)
[facility]  Patient:    [name], [name] Visit Number: [id number] MRN: [id number]          Service Type: DSU Location: [facility] Attending Physician:  Ronne Binning Dictated by:   Nicki Reaper, M.D. Proc. Date: 10/06/01 Admit Date:  10/06/2001                             Operative Report  PREOPERATIVE DIAGNOSIS:  Carpal tunnel syndrome; stenosing tenosynovitis with cyst, right middle finger.  POSTOPERATIVE DIAGNOSIS:  Carpal tunnel syndrome; stenosing tenosynovitis with cyst, right middle finger.  OPERATION:  Release right carpal tunnel; excision cyst x 2, right middle finger, with release A1 pulley.  SURGEON:  Nicki Reaper, M.D.  ASSISTANT:  Joaquin Courts, R.N.  ANESTHESIA:  Forearm-based IV regional.  DATE OF OPERATION:  October 06, 2001  ANESTHESIOLOGIST:  Maren Beach, M.D.  HISTORY:  The patient is a 70 year old female with a history of carpal tunnel syndrome, EMG nerve conduction is positive, which has not responded to conservative treatment, along with triggering of her middle finger and two cysts proximal and distal to the A1 pulley.  PROCEDURE:  The patient was brought to the operating room where a forearm-based IV regional anesthetic was carried out without difficulty.  She was prepped and draped using Betadine scrub and solution with the right arm free.  A longitudinal incision was made in the palm and carried down through the subcutaneous tissue.  Bleeders were electrocauterized.  The palmar fascia was split, the superficial palmar arch identified, the flexor tendon tendon to the ring and little finger identified to the ulnar side of the median nerve. The carpal retinaculum was incised with sharp dissection.   No further lesions were identified.  The wound was irrigated, tenosynovial tissue was moderately thickened.  The wound was closed with interrupted 5-0 nylon sutures.  A separate incision was made obliquely over  the A1 pulley of the right middle finger, carried down through subcutaneous tissue, bleeders again electrocauterized, the neurovascular structures protected.  Two cysts were immediately apparent - one on the volar aspect of the A1 pulley.  This was excised and sent to pathology.  The A1 pulley was released in its radial aspect.  The second cyst was present over the A2 pulley.  This cyst was removed and also sent to pathology.  The finger was placed through a full range of motion, no further triggering was identified.  The wound was irrigated.  The skin was closed with interrupted 5-0 nylon sutures.  A sterile compressive dressing and splint were applied.  The patient tolerated the procedure well and was taken to the recovery room for observation in satisfactory condition.  DISPOSITION:  She is discharged home to return to [facility] [place] in one week on [place] NX and Septra DS. Dictated by:   Nicki Reaper, M.D. Attending Physician:  Ronne Binning DD:  10/06/01 TD:  10/06/01 Job: [id number]

## 2010-12-27 NOTE — H&P (Signed)
NAMECASANDRA, [name]                           ACCOUNT NO.:  [id number]   MEDICAL RECORD NO.:  [id number]                   PATIENT TYPE:  AMB   LOCATION:  DSC                                  FACILITY:  [facility]   PHYSICIAN:  Cindee Salt, M.D.                    DATE OF BIRTH:  Apr 21, 1941   DATE OF ADMISSION:  08/18/2002  DATE OF DISCHARGE:                                HISTORY & PHYSICAL   PREOPERATIVE DIAGNOSIS:  Non-union osteotomy, right ulna.   POSTOPERATIVE DIAGNOSIS:  Non-union osteotomy, right ulna.   OPERATION:  Resection of non-union bone graft, re-plating, iliac crest graft  right ulna.   SURGEON:  Dr. Merlyn Lot.   ASSISTANT:  R.N.   ANESTHESIA:  General.   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   DESCRIPTION OF PROCEDURE:  The patient was brought to the operating room  where a general anesthetic was carried out without difficulty.  She was  prepped and draped using Betadine scrub and solution, right arm free and the  right hip anterior iliac crest.  She was prepped and draped in the supine  position.  The arm was exsanguinated with an Esmarch bandage.  Tourniquet  placed high in the arm was inflated to 250 mmHg.  The old incision was used,  carried down through subcutaneous tissue.  Bleeders were electrocauterized  and dissection carried between the extensor digitorum quinti and flexor  carpi ulnaris.  The plate was immediately apparent.  The periosteum was  incised.  The non-union site was visit, there was no gross purulence.  Cultures were taken.  The plate was noted to be loose distally and as such,  it was decided to remove it and re-plate.  The distal DCP plate, six hole,  was then placed and drilled, measured and tapped.  Three distal screws were  placed, these each measured 16, 16 and 20 mm.  The area of  non-union was  resected with rongeurs.  A separate incision was then made over the right  iliac crest, carried down through subcutaneous tissues.  Bleeders again  electrocauterized, incision carried down to the adductor hip flexor interval  and the periosteum incised.  The periosteum elevated from the iliac crest  anteriorly.  An osteotome was used to create a window and a curet was used  to remove cancellous bone.  This was then packed in the old non-union site,  the proximal three hole were placed, these were placed under compression and  measured 16 and 18 mm.  The remainder of the bone graft was packed about the  ulna, x-rays revealed that the area was entirely filled with the osteotomy  site close.  The wounds were irrigated and closed in layers with  2-0 Vicryl  in the periosteum, the fascia, subcutaneous tissue and a 4-0 Monocryl was  used for skin closure.  Steri-Strips were applied.  The hip was closed after  irrigation in layers, reapproximating the bone window and sutured with 0  Vicryl suture.  Subcutaneous tissues were closed with Vicryl and the skin  with subcuticular 3-0 Monocryl suture.  A long arm splint applied to the  hand.  Compressive dressing applied to the hip.  The patient tolerated the  procedure well and was taken to the recovery room for observation, in  satisfactory condition.  She is admitted for overnight stay.  She will be  discharged on Percocet and erythromycin.                                               Cindee Salt, M.D.    Angelique Blonder  D:  08/18/2002  T:  08/18/2002  Job:  [id number]

## 2010-12-27 NOTE — Consult Note (Signed)
[name], DELAHUNT NO.:  [id number]   MEDICAL RECORD NO.:  [id number]                   PATIENT TYPE:  INP   LOCATION:  3029                                 FACILITY:  [facility]   PHYSICIAN:  Tia Alert, MD                  DATE OF BIRTH:  [birth year]   DATE OF CONSULTATION:  DATE OF DISCHARGE:                                   CONSULTATION   CHIEF COMPLAINT:  Closed head injury/subdural hematoma.   HISTORY OF PRESENT ILLNESS:  [name] is a 70 year old white female who  states that she fell on the ice about four days ago.  She thinks that she  had a loss of consciousness.  She complained of a headache and nausea and  vomiting.  She called her primary care physician who ordered a head CT which  revealed a right tentorial subdural hematoma.  She was then instructed to go  to the emergency department and she was admitted from there for further  care.  She denies any diplopia, numbness, tingling, or weakness, or seizure  activity.  She does complain of severe headache which is unrelieved by  narcotic pain medications.  Follow-up head CT today showed no change in the  right tentorial subdural hematoma.  Neurosurgical evaluation was requested.   PAST MEDICAL HISTORY:  1. Appendectomy.  2. Hysterectomy.  3. Asthma.  4. Migraine headaches.  5. Peptic ulcer disease.  6. Hypertriglyceridemia.  7. Hiatal hernia.  8. Back pain.   MEDICATIONS:  Fioricet, Darvocet, Tagamet, atenolol, metoclopramide,  cyclobenzaprine, fluoxetine, Flovent, and albuterol.   ALLERGIES:  CODEINE, DEMEROL, TALWIN, PENICILLIN, SULFA.   REVIEW OF SYSTEMS:  As above.   PHYSICAL EXAMINATION:  GENERAL:  A 70 year old white female who is in no  acute distress though she is complaining with a severe headache and keeps a  cool cloth to her forehead.  She is afebrile.  VITAL SIGNS:  Stable with a pulse of 75, respirations 18; blood pressure 119-  150/67-84.  She has good oxygen  saturation on 2 liter nasal cannula.  HEENT:  Normocephalic.  There is no obvious external trauma.  Extraocular  movements are intact without nystagmus.  Pupils are equal and reactive.  She  has no facial asymmetry.  Tongue protrudes in midline.  She can puff out her  cheeks.  She has good shoulder shrug.  Her strength is 5/5 throughout with  good muscle tone and good muscle bulk.  No pronator drift.  Her gait is not  tested.  Reflexes are okay.  Sensation is grossly intact.   IMAGING STUDIES:  A head CT done yesterday and today, I have reviewed, as  well as the report.  It does show a right tentorial subdural hematoma with  no substantial midline shift or mass effect.  The basal cisterns are open.  There is no hydrocephalus or intraparenchymal contusion.  ASSESSMENT/PLAN:  This is a 70 year old white female with a right tentorial  subdural hematoma after a fall about four days ago.  She has been in the  hospital for more than 24 hours and has had two head CTs without change that  show a right tentorial subdural hematoma.  I think it is okay to go ahead  and advance her diet.  We will use oral pain medications and also try  Decadron to try to decrease meningeal irritation to try to help the  headaches.  I think it is safe to discharge her home when her pain is well  controlled on oral pain medications.  I would perform a follow-up head CT in  about two weeks unless mental status changes or other neurologic changes  prompt an earlier scan, followed by a scan about six weeks after discharge  to evaluate for resolution of the hematoma.                                               Tia Alert, MD    DSJ/MEDQ  D:  09/05/2003  T:  09/06/2003  Job:  [id number]

## 2010-12-27 NOTE — Op Note (Signed)
NAME:  [name], [name]                           ACCOUNT NO.:  [id number]   MEDICAL RECORD NO.:  [id number]                   PATIENT TYPE:  AMB   LOCATION:  ENDO                                 FACILITY:  [facility]   PHYSICIAN:  Anselmo Rod, M.D.               DATE OF BIRTH:  Oct 19, 1940   DATE OF PROCEDURE:  01/01/2004  DATE OF DISCHARGE:  01/01/2004                                 OPERATIVE REPORT   PROCEDURE PERFORMED:  Screening colonoscopy.   ENDOSCOPIST:  Charna Elizabeth, M.D.   INSTRUMENT USED:  Olympus video colonoscope.   INDICATIONS FOR PROCEDURE:  The patient is a 70 year old white female  undergoing a screening colonoscopy to rule out colonic polyps, etc.  The  patient has had guaiac positive stools on a recent physical examination.   PREPROCEDURE PREPARATION:  Informed consent was procured from the patient.  The patient was fasted for eight hours prior to the procedure and prepped  with a bottle of magnesium citrate and a gallon of GoLYTELY the night prior  to the procedure.   PREPROCEDURE PHYSICAL:  The patient had stable vital signs.  Neck supple.  Chest clear to auscultation.  S1 and S2 regular.  Abdomen soft with normal  bowel sounds.   DESCRIPTION OF PROCEDURE:  The patient was placed in left lateral decubitus  position and sedated with an additional 75 mcg of fentanyl and 5 mg of  Versed intravenously.  Once the patient was adequately sedated and  maintained on low flow oxygen and continuous cardiac monitoring, the Olympus  video colonoscope was advanced from the rectum to the cecum.  The  appendicular orifice and ileocecal valve were clearly visualized and  photographed.  There was some residual stool in the colon.  Multiple washes  were done.  No masses, polyps, erosions, ulcerations or diverticula were  seen.  Small internal hemorrhoids were appreciated on retroflexion in the  rectum.  The patient tolerated the procedure without immediate  complications.   IMPRESSION:  1. Unrevealing colonoscopy up to the cecum except for small internal     hemorrhoids.  2. Some residual stool in the colon so very small lesions could have been     missed.   RECOMMENDATIONS:  1. Continue high fiber diet with liberal fluid intake.  2. Repeat colorectal cancer screening is recommended in the next 10 years     unless the patient develops any     abnormal symptoms in the interim.  3. Outpatient followup in the next two weeks for repeat guaiac testing.     Further recommendations will be made at that time.                                               Anselmo Rod, M.D.  JNM/MEDQ  D:  01/03/2004  T:  01/03/2004  Job:  [id number]   cc:   William A. [name], M.D.  Fax: [phone]

## 2011-01-07 ENCOUNTER — Other Ambulatory Visit: Payer: Self-pay | Admitting: Family Medicine

## 2011-01-07 NOTE — Telephone Encounter (Signed)
Refill request

## 2011-01-20 ENCOUNTER — Other Ambulatory Visit: Payer: Self-pay | Admitting: Family Medicine

## 2011-01-20 NOTE — Telephone Encounter (Signed)
Refill request

## 2011-02-14 ENCOUNTER — Ambulatory Visit (INDEPENDENT_AMBULATORY_CARE_PROVIDER_SITE_OTHER): Payer: Medicare Other | Admitting: Family Medicine

## 2011-02-14 ENCOUNTER — Encounter: Payer: Self-pay | Admitting: Family Medicine

## 2011-02-14 VITALS — BP 104/68 | HR 69 | Temp 98.4°F | Ht 61.0 in | Wt 188.0 lb

## 2011-02-14 DIAGNOSIS — I10 Essential (primary) hypertension: Secondary | ICD-10-CM

## 2011-02-14 DIAGNOSIS — G43909 Migraine, unspecified, not intractable, without status migrainosus: Secondary | ICD-10-CM

## 2011-02-14 DIAGNOSIS — E119 Type 2 diabetes mellitus without complications: Secondary | ICD-10-CM

## 2011-02-14 LAB — POCT GLYCOSYLATED HEMOGLOBIN (HGB A1C): Hemoglobin A1C: 6.3

## 2011-02-14 MED ORDER — ATENOLOL 25 MG PO TABS: 25.0000 mg | ORAL_TABLET | Freq: Every day | ORAL | Status: DC

## 2011-02-14 NOTE — Assessment & Plan Note (Signed)
At goal.  Two episodes of mildly low BS.  No change in meds.

## 2011-02-14 NOTE — Assessment & Plan Note (Signed)
Perhaps overtreated.  Will decrease atenolol to 25 mg.  Watch for worsening BP or headaches.

## 2011-02-14 NOTE — Progress Notes (Signed)
  Subjective:    Patient ID: Zephaniah Enyeart, female    DOB: [birth year], 70 y.o.   MRN: [id number]  HPI Multiple issues BP lowish and she has had some orthostasis symptoms.  ACE also protects kidneys.  BB helps migraines. Had one episode of low blood sugar. Numbness middle toes, left foot.   Review of Systems No syncope, chest pain     Objective:   Physical Exam Lungs clear Neck, no thyromegally Cardiac RRR without M Abd benign Nl skin, nails and pulses both feet.  Nl sensation except 2,3,4 toes left foot.  Flattened        Assessment & Plan:

## 2011-02-14 NOTE — Patient Instructions (Signed)
Diabetes and Foot Care     Diabetes may cause you to have a poor blood supply (circulation) to your legs and feet. Because of this, the skin may be thinner, break easier, and heal slower. You also may have nerve damage in your legs and feet causing decreased feeling. You may not notice minor injuries to your feet that could lead to serious problems or infections. Taking care of your feet is one of the most important things you can do for yourself.       HOME CARE INSTRUCTIONS FOR FOOT CARE   Do not go barefoot. Bare feet are easily injured.   Check your feet daily for blisters, cuts, and redness.   Wash your feet with warm water (not hot) and mild soap. Pat your feet and between your toes until completely dry.   Apply a moisturizing lotion that does not contain alcohol or petroleum jelly to the dry skin on your feet and to dry brittle toenails. Do not put it between your toes.   Trim your toenails straight across. Do not dig under them or around the cuticle.   Do not cut corns or calluses, or try to remove them with medicine.   Wear clean cotton socks or stockings every day. Make sure they are not too tight. Do not wear knee high stockings since they may decrease blood flow to your legs.   Wear leather shoes that fit properly and have enough cushioning. To break in new shoes, wear them just a few hours a day to avoid injuring your feet.   Wear shoes at all times, even in the house.   Do not cross your legs. This may decrease the blood flow to your feet.    If you find a minor scrape, cut, or break in the skin on your feet, keep it and the skin around it clean and dry. These areas may be cleansed with mild soap and water. Do not use peroxide, alcohol, iodine or Merthiolate.   When you remove an adhesive bandage, be sure not to harm the skin around it.   If you have a wound, look at it several times a day to make sure it is healing.    Do not use heating pads or hot water bottles. Burns can occur. If you have lost feeling in your feet or legs, you may not know it is happening until it is too late.   Report any cuts, sores or bruises to your caregiver. Do not wait!       SEEK MEDICAL CARE IF:   You have an injury that is not healing or you notice redness, numbness, burning, or tingling.   Your feet always feel cold.   You have pain or cramps in your legs and feet.     SEEK IMMEDIATE MEDICAL CARE IF:   There is increasing redness, swelling, or increasing pain in the wound.   There is a red line that goes up your leg.   Pus is coming from a wound.   You develop an unexplained oral temperature above 102 F (38.9 C), or as your caregiver suggests.   You notice a bad smell coming from an ulcer or wound.      MAKE SURE YOU:    Understand these instructions.    Will watch your condition.   Will get help right away if you are not doing well or get worse.     Document Released: 07/25/2000  Document Re-Released: 05/25/2009  [employer] Patient   Information 2011 [employer].

## 2011-04-04 ENCOUNTER — Ambulatory Visit (INDEPENDENT_AMBULATORY_CARE_PROVIDER_SITE_OTHER): Payer: Medicare Other | Admitting: Family Medicine

## 2011-04-04 ENCOUNTER — Encounter: Payer: Self-pay | Admitting: Family Medicine

## 2011-04-04 VITALS — BP 128/80 | HR 80 | Temp 98.2°F | Ht 59.5 in | Wt 187.2 lb

## 2011-04-04 DIAGNOSIS — R5383 Other fatigue: Secondary | ICD-10-CM | POA: Insufficient documentation

## 2011-04-04 DIAGNOSIS — R5381 Other malaise: Secondary | ICD-10-CM

## 2011-04-04 DIAGNOSIS — R300 Dysuria: Secondary | ICD-10-CM | POA: Insufficient documentation

## 2011-04-04 LAB — CBC WITH DIFFERENTIAL/PLATELET
Basophils Absolute: 0 10*3/uL (ref 0.0–0.1)
Basophils Relative: 1 % (ref 0–1)
Eosinophils Absolute: 0.2 10*3/uL (ref 0.0–0.7)
Eosinophils Relative: 3 % (ref 0–5)
HCT: 38.4 % (ref 36.0–46.0)
Hemoglobin: 12.1 g/dL (ref 12.0–15.0)
Lymphocytes Relative: 24 % (ref 12–46)
Lymphs Abs: 1.3 10*3/uL (ref 0.7–4.0)
MCH: 27.9 pg (ref 26.0–34.0)
MCHC: 31.5 g/dL (ref 30.0–36.0)
MCV: 88.7 fL (ref 78.0–100.0)
Monocytes Absolute: 0.4 10*3/uL (ref 0.1–1.0)
Monocytes Relative: 8 % (ref 3–12)
Neutro Abs: 3.3 10*3/uL (ref 1.7–7.7)
Neutrophils Relative %: 64 % (ref 43–77)
Platelets: 150 10*3/uL (ref 150–400)
RBC: 4.33 MIL/uL (ref 3.87–5.11)
RDW: 15.4 % (ref 11.5–15.5)
WBC: 5.2 10*3/uL (ref 4.0–10.5)

## 2011-04-04 LAB — POCT URINALYSIS DIPSTICK
Bilirubin, UA: NEGATIVE
Blood, UA: NEGATIVE
Glucose, UA: NEGATIVE
Ketones, UA: NEGATIVE
Leukocytes, UA: NEGATIVE
Nitrite, UA: NEGATIVE
Protein, UA: NEGATIVE
Spec Grav, UA: 1.015
Urobilinogen, UA: 0.2
pH, UA: 7

## 2011-04-04 LAB — TSH: TSH: 3.083 u[IU]/mL (ref 0.350–4.500)

## 2011-04-04 MED ORDER — TOPIRAMATE 25 MG PO TABS: 100.0000 mg | ORAL_TABLET | Freq: Every day | ORAL | Status: DC

## 2011-04-04 NOTE — Assessment & Plan Note (Signed)
With normal UA, this seems to be a non problem

## 2011-04-04 NOTE — Assessment & Plan Note (Signed)
Most likely due to topamax.  Will decrease dose to 100 mg daily.  Check labs and follow
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
## 2011-04-04 NOTE — Patient Instructions (Signed)
Decrease topamax to 100 mg daily I will notify with lab results. Please see me in two weeks if symptoms fail to improve.

## 2011-04-05 LAB — COMPLETE METABOLIC PANEL WITH GFR
ALT: 21 U/L (ref 0–35)
AST: 29 U/L (ref 0–37)
Albumin: 4.5 g/dL (ref 3.5–5.2)
Alkaline Phosphatase: 92 U/L (ref 39–117)
BUN: 17 mg/dL (ref 6–23)
CO2: 23 mEq/L (ref 19–32)
Calcium: 9.6 mg/dL (ref 8.4–10.5)
Chloride: 108 mEq/L (ref 96–112)
Creat: 0.99 mg/dL (ref 0.50–1.10)
GFR, Est African American: >60 mL/min (ref >60)
GFR, Est Non African American: 56 mL/min — ABNORMAL LOW (ref >60)
Glucose, Bld: 92 mg/dL (ref 70–99)
Potassium: 4 mEq/L (ref 3.5–5.3)
Sodium: 140 mEq/L (ref 135–145)
Total Bilirubin: 0.5 mg/dL (ref 0.3–1.2)
Total Protein: 6.6 g/dL (ref 6.0–8.3)

## 2011-04-14 ENCOUNTER — Encounter: Payer: Self-pay | Admitting: Family Medicine

## 2011-04-15 ENCOUNTER — Telehealth: Payer: Self-pay | Admitting: Family Medicine

## 2011-04-15 DIAGNOSIS — E119 Type 2 diabetes mellitus without complications: Secondary | ICD-10-CM

## 2011-04-15 NOTE — Telephone Encounter (Signed)
Has been working hard on diet and exercise and has had sustained weight loss.  Now having multiple episodes of symptomatic hypoglycemia.  Will stop metformin, keep bid accu checks and recheck A1C in 2- 3 months.  She will call with reports of any high blood sugars off metformin.

## 2011-04-15 NOTE — Telephone Encounter (Signed)
Mrs. Vachon calling to say her blood sugar levels are still running low.  Was 49 @ 2:45 yesterday afternoon.  After eating a slice of apple pie and drinking orange juice it went back up to 118.  At 10:30 last night it was 81.  This am at 7:45 it was 98.  Please call asap

## 2011-05-12 ENCOUNTER — Other Ambulatory Visit: Payer: Self-pay | Admitting: Family Medicine

## 2011-05-12 NOTE — Telephone Encounter (Signed)
Refill request

## 2011-05-21 LAB — CBC
HCT: 33 — ABNORMAL LOW
HCT: 34.7 — ABNORMAL LOW
HCT: 37.1
Hemoglobin: 10.9 — ABNORMAL LOW
Hemoglobin: 11.5 — ABNORMAL LOW
Hemoglobin: 12.4
MCHC: 33.1
MCHC: 33.2
MCHC: 33.4
MCV: 88
MCV: 88.7
MCV: 89.3
Platelets: 128 — ABNORMAL LOW
Platelets: 131 — ABNORMAL LOW
Platelets: 164
RBC: 3.71 — ABNORMAL LOW
RBC: 3.89
RBC: 4.22
RDW: 14.2 — ABNORMAL HIGH
RDW: 14.6 — ABNORMAL HIGH
RDW: 14.7 — ABNORMAL HIGH
WBC: 5.7
WBC: 5.8
WBC: 6.2

## 2011-05-21 LAB — URINALYSIS, ROUTINE W REFLEX MICROSCOPIC
Bilirubin Urine: NEGATIVE
Glucose, UA: NEGATIVE
Hgb urine dipstick: NEGATIVE
Ketones, ur: NEGATIVE
Nitrite: NEGATIVE
Protein, ur: NEGATIVE
Specific Gravity, Urine: 1.01
Urobilinogen, UA: 0.2
pH: 6

## 2011-05-21 LAB — HEPARIN LEVEL (UNFRACTIONATED)
Heparin Unfractionated: 0.3
Heparin Unfractionated: 0.54

## 2011-05-21 LAB — CK TOTAL AND CKMB (NOT AT ARMC)
CK, MB: 1.8
CK, MB: 1.8
CK, MB: 1.9
Relative Index: INVALID
Relative Index: INVALID
Relative Index: INVALID
Total CK: 63
Total CK: 69
Total CK: 70

## 2011-05-21 LAB — COMPREHENSIVE METABOLIC PANEL
ALT: 36 — ABNORMAL HIGH
AST: 41 — ABNORMAL HIGH
Albumin: 4.1
Alkaline Phosphatase: 92
BUN: 20
CO2: 27
Calcium: 9.5
Chloride: 103
Creatinine, Ser: 1.09
GFR calc Af Amer: >60
GFR calc non Af Amer: 50 — ABNORMAL LOW
Glucose, Bld: 83
Potassium: 3.6
Sodium: 140
Total Bilirubin: 0.6
Total Protein: 6.9

## 2011-05-21 LAB — HEMOGLOBIN A1C
Hgb A1c MFr Bld: 6.9 — ABNORMAL HIGH
Mean Plasma Glucose: 168

## 2011-05-21 LAB — BASIC METABOLIC PANEL
BUN: 12
BUN: 16
CO2: 27
CO2: 30
Calcium: 8.9
Calcium: 9
Chloride: 104
Chloride: 104
Creatinine, Ser: 0.95
Creatinine, Ser: 0.96
GFR calc Af Amer: >60
GFR calc Af Amer: >60
GFR calc non Af Amer: 58 — ABNORMAL LOW
GFR calc non Af Amer: 59 — ABNORMAL LOW
Glucose, Bld: 91
Glucose, Bld: 96
Potassium: 3.7
Potassium: 4
Sodium: 139
Sodium: 140

## 2011-05-21 LAB — DIFFERENTIAL
Basophils Absolute: 0
Basophils Relative: 0
Eosinophils Absolute: 0.1
Eosinophils Relative: 2
Lymphocytes Relative: 23
Lymphs Abs: 1.4
Monocytes Absolute: 0.5
Monocytes Relative: 8
Neutro Abs: 4.1
Neutrophils Relative %: 67

## 2011-05-21 LAB — APTT
aPTT: 26
aPTT: 27

## 2011-05-21 LAB — LIPID PANEL
Cholesterol: 126
HDL: 48
LDL Cholesterol: 51
Total CHOL/HDL Ratio: 2.6
Triglycerides: 137
VLDL: 27

## 2011-05-21 LAB — TROPONIN I
Troponin I: 0.01
Troponin I: 0.01
Troponin I: 0.01

## 2011-05-21 LAB — D-DIMER, QUANTITATIVE: D-Dimer, Quant: 0.42

## 2011-05-21 LAB — POCT CARDIAC MARKERS
CKMB, poc: 1.8
Myoglobin, poc: 82.8
Operator id: 272551
Troponin i, poc: <0.05

## 2011-05-21 LAB — B-NATRIURETIC PEPTIDE (CONVERTED LAB): Pro B Natriuretic peptide (BNP): 53

## 2011-05-21 LAB — PROTIME-INR
INR: 0.9
INR: 1
Prothrombin Time: 12.4
Prothrombin Time: 13.2

## 2011-05-21 LAB — HEPARIN ANTIBODY SCREEN: Heparin Antibody Screen: NEGATIVE

## 2011-06-11 ENCOUNTER — Ambulatory Visit (INDEPENDENT_AMBULATORY_CARE_PROVIDER_SITE_OTHER): Payer: Medicare Other | Admitting: *Deleted

## 2011-06-11 DIAGNOSIS — Z23 Encounter for immunization: Secondary | ICD-10-CM

## 2011-07-31 ENCOUNTER — Encounter (HOSPITAL_COMMUNITY): Payer: Self-pay

## 2011-07-31 ENCOUNTER — Emergency Department (HOSPITAL_COMMUNITY)
Admission: EM | Admit: 2011-07-31 | Discharge: 2011-07-31 | Disposition: A | Discharge status: Home / Self Care | Payer: Medicare Other | Source: Home / Self Care

## 2011-07-31 HISTORY — DX: Essential (primary) hypertension: I10

## 2011-07-31 MED ORDER — VALACYCLOVIR HCL 1 G PO TABS: ORAL_TABLET | ORAL | Status: DC

## 2011-07-31 MED ORDER — HYDROCODONE-ACETAMINOPHEN 5-325 MG PO TABS: ORAL_TABLET | ORAL | Status: DC

## 2011-07-31 NOTE — ED Provider Notes (Signed)
History     CSN: [id number]  Arrival date & time 07/31/11  9562   None     Chief Complaint  Patient presents with  . Back Pain    (Consider location/radiation/quality/duration/timing/severity/associated sxs/prior treatment) HPI Comments: Onset yesterday of Rt sided mid back pain. Pain is burning with intermittent sharp pains. Pain is worse with light touch of skin of her clothing, and she is unable to wear her bra. She cannot sit or lie with pressure on the Rt side either. Pt states feels the same as previous episode of shingles. Husband noticed redness this morning and states it is worsening as the day progresses. Has not used anyhting topically, nor has she taken anything oral for pain. No trauma. She denies chest pain, cough, dyspnea, abd pain, nausea, vomiting or change in BMs. She  Is voiding regularly and no dysuria.  Patient is a 70 y.o. female presenting with back pain. The history is provided by the patient and the spouse.  Back Pain  This is a new problem. The current episode started yesterday. The problem occurs constantly. The problem has not changed since onset.The pain is associated with no known injury. The pain is present in the thoracic spine. The quality of the pain is described as burning (sharp). The pain does not radiate. The pain is severe. Exacerbated by: touch. The pain is the same all the time. Pertinent negatives include no chest pain, no fever, no numbness, no abdominal pain, no dysuria, no leg pain, no paresthesias, no tingling and no weakness. She has tried nothing for the symptoms.    Past Medical History  Diagnosis Date  . Hypertension   . Asthma   . Migraine     Past Surgical History  Procedure Date  . Appendectomy   . Abdominal hysterectomy   . Arm surgery     No family history on file.  History  Substance Use Topics  . Smoking status: Former Games developer  . Smokeless tobacco: Former Neurosurgeon    Quit date: 04/04/1991  . Alcohol Use: No    OB  History    Grav Para Term Preterm Abortions TAB SAB Ect Mult Living                  Review of Systems  Constitutional: Negative for fever and chills.  Respiratory: Negative for cough and shortness of breath.   Cardiovascular: Negative for chest pain.  Gastrointestinal: Negative for nausea, vomiting, abdominal pain, diarrhea and constipation.  Genitourinary: Negative for dysuria, urgency, frequency and hematuria.  Musculoskeletal: Positive for back pain.  Skin: Positive for color change. Negative for rash and wound.  Neurological: Negative for tingling, weakness, numbness and paresthesias.    Allergies  Amoxicillin; Azithromycin; Butalbital; Clindamycin hcl; Codeine phosphate; Etodolac; Gabapentin; Hydrocodone-acetaminophen; Meperidine hcl; Naproxen; Oxycodone; Pentazocine-naloxone hcl; Sulfamethoxazole; Sumatriptan; Tramadol; and Zonegran  Home Medications   Current Outpatient Rx  Name Route Sig Dispense Refill  . ALBUTEROL 90 MCG/ACT IN AERS Inhalation Inhale 2 puffs into the lungs every 4 (four) hours.      . ASPIRIN 81 MG PO CHEW Oral Chew 81 mg by mouth daily.      . ATENOLOL 25 MG PO TABS Oral Take 1 tablet (25 mg total) by mouth daily. 30 tablet 11  . BACLOFEN 10 MG PO TABS  As needed for headache per headache center     . ALIGN PO CAPS Oral Take 1 capsule by mouth 2 (two) times daily.      . CYCLOBENZAPRINE  HCL 10 MG PO TABS  TAKE ONE TABLET BY MOUTH AT BEDTIME 90 tablet 6  . ENALAPRIL MALEATE 10 MG PO TABS  TAKE ONE-HALF TABLET BY MOUTH EVERY DAY 45 tablet 3  . FAMOTIDINE 40 MG PO TABS Oral Take 40 mg by mouth daily.      Marland Kitchen FLUTICASONE PROPIONATE  HFA 220 MCG/ACT IN AERO Inhalation Inhale 1 puff into the lungs 2 (two) times daily.      Marland Kitchen FREESTYLE LITE TEST VI STRP  USE AS DIRECTED 100 each 8  . FREESTYLE LANCETS MISC  USE AS DIRECTED 100 each 9  . LIDOCAINE 5 % EX PTCH  Per headache center     . LORATADINE 10 MG PO TABS Oral Take 10 mg by mouth daily.      Marland Kitchen MECLIZINE  HCL 25 MG PO TABS Oral Take 25 mg by mouth every 6 (six) hours as needed. dizziness     . CENTRUM PO TABS Oral Take 1 tablet by mouth daily.      Marland Kitchen NORTRIPTYLINE HCL 50 MG PO CAPS Oral Take 50 mg by mouth 2 (two) times daily.      Marland Kitchen SIMVASTATIN 20 MG PO TABS  TAKE ONE TABLET BY MOUTH EVERY NIGHT 90 tablet 3  . TOPIRAMATE 25 MG PO TABS Oral Take 4 tablets (100 mg total) by mouth daily.    . TRIAMCINOLONE ACETONIDE 0.5 % EX CREA  Apply to affected area two times daily     . DICLOFENAC POTASSIUM 50 MG PO PACK  Per headache center     . DICLOFENAC POTASSIUM PO Oral Take 50 mg by mouth. Take one by mouth every 8 hours as needed pain.  No more than 2 days per week.     Marland Kitchen HYDROCODONE-ACETAMINOPHEN 5-325 MG PO TABS  1-2 tabs every 6 hrs prn pain 20 tablet 0  . TRAMADOL HCL 50 MG PO TABS Oral Take 50 mg by mouth every 6 (six) hours as needed. For back pain     . VALACYCLOVIR HCL 1 G PO TABS  1 tab TID x 7d 21 tablet 0    BP 143/84  Pulse 85  Temp(Src) 98.4 F (36.9 C) (Oral)  Resp 18  SpO2 100%  Physical Exam  Nursing note and vitals reviewed. Constitutional: She appears well-developed and well-nourished. No distress.  HENT:  Head: Normocephalic and atraumatic.  Right Ear: Tympanic membrane, external ear and ear canal normal.  Left Ear: Tympanic membrane, external ear and ear canal normal.  Nose: Nose normal.  Mouth/Throat: Uvula is midline, oropharynx is clear and moist and mucous membranes are normal. No oropharyngeal exudate, posterior oropharyngeal edema or posterior oropharyngeal erythema.  Neck: Neck supple.  Cardiovascular: Normal rate, regular rhythm and normal heart sounds.   Pulmonary/Chest: Effort normal and breath sounds normal. No respiratory distress.  Musculoskeletal:       Thoracic back: She exhibits tenderness and pain. She exhibits normal range of motion, no bony tenderness, no swelling, no edema, no deformity and no spasm.       Back:  Lymphadenopathy:    She has no  cervical adenopathy.  Neurological: She is alert.  Skin: Skin is warm and dry. Rash noted. Rash is macular.     Psychiatric: She has a normal mood and affect.    ED Course  Procedures (including critical care time)  Labs Reviewed - No data to display No results found.   1. Herpes zoster       MDM  Suspected early herpes zoster. Multiple pain medication allergies. Discussed with Dr Juanetta Gosling. Will rx Hydocodone and have pt take Benadryl with it as she has with pain meds in past.         Melody Comas, [place] 07/31/11 2018

## 2011-07-31 NOTE — ED Notes (Signed)
C/o pain in rt mid back- denies heavy lifting/strenuous activity or injury.  States pain started yesterday.  Reports pain is similar to when she had shingles before.  Unable to wear her bra or put any pressure on the area.  States she has also had shingle vaccine

## 2011-08-01 ENCOUNTER — Telehealth: Payer: Self-pay | Admitting: Family Medicine

## 2011-08-01 NOTE — Telephone Encounter (Signed)
After speaking with Dr. [name] about this, patient will not be prescribed more pain meds for this. I f this is an issue she can make an appointment when we open back 12/26.

## 2011-08-01 NOTE — Telephone Encounter (Signed)
Was diagnosed with shingles yesterday and was given 20 hydrocodone.  If afraid this will not be enough to get her through the holiday and wants to know if Dr [name] could call more in to get her through.  pls let patient know

## 2011-08-02 NOTE — ED Provider Notes (Signed)
Medical screening examination/treatment/procedure(s) were performed by non-physician practitioner and as supervising physician I was immediately available for consultation/collaboration.  LANEY,RONNIE   Ronnie Laney, MD 08/02/11 0935 

## 2011-08-06 ENCOUNTER — Other Ambulatory Visit: Payer: Self-pay | Admitting: Family Medicine

## 2011-08-06 NOTE — Telephone Encounter (Signed)
Refill request

## 2011-09-09 ENCOUNTER — Other Ambulatory Visit: Payer: Self-pay | Admitting: Family Medicine

## 2011-09-10 ENCOUNTER — Ambulatory Visit (INDEPENDENT_AMBULATORY_CARE_PROVIDER_SITE_OTHER): Payer: Medicare Other | Admitting: Family Medicine

## 2011-09-10 ENCOUNTER — Encounter: Payer: Self-pay | Admitting: Family Medicine

## 2011-09-10 VITALS — BP 123/83 | HR 80 | Temp 97.0°F | Ht 60.0 in | Wt 186.5 lb

## 2011-09-10 DIAGNOSIS — G4730 Sleep apnea, unspecified: Secondary | ICD-10-CM

## 2011-09-10 DIAGNOSIS — J984 Other disorders of lung: Secondary | ICD-10-CM

## 2011-09-10 DIAGNOSIS — I10 Essential (primary) hypertension: Secondary | ICD-10-CM

## 2011-09-10 DIAGNOSIS — K589 Irritable bowel syndrome without diarrhea: Secondary | ICD-10-CM

## 2011-09-10 DIAGNOSIS — E119 Type 2 diabetes mellitus without complications: Secondary | ICD-10-CM

## 2011-09-10 DIAGNOSIS — L2089 Other atopic dermatitis: Secondary | ICD-10-CM

## 2011-09-10 DIAGNOSIS — E7800 Pure hypercholesterolemia, unspecified: Secondary | ICD-10-CM

## 2011-09-10 LAB — POCT GLYCOSYLATED HEMOGLOBIN (HGB A1C): Hemoglobin A1C: 7.2

## 2011-09-10 MED ORDER — TRIAMCINOLONE ACETONIDE 0.5 % EX CREA: TOPICAL_CREAM | Freq: Two times a day (BID) | CUTANEOUS | Status: DC

## 2011-09-10 MED ORDER — METFORMIN HCL ER 500 MG PO TB24: 500.0000 mg | ORAL_TABLET | Freq: Every day | ORAL | Status: DC

## 2011-09-10 NOTE — Patient Instructions (Signed)
I sent in two meds. Restart metformen once a day Refilled your eczema cream Come in for fasting blood work at [facility]. Try capcesium cream for the back. See me in 3-4 months to recheck the diabetes.

## 2011-09-10 NOTE — Assessment & Plan Note (Signed)
Restart low dose metformen

## 2011-09-11 NOTE — Assessment & Plan Note (Signed)
Well controled. 

## 2011-09-11 NOTE — Progress Notes (Signed)
  Subjective:    Patient ID: [name], female    DOB: 21-May-1941, 71 y.o.   MRN: [id number]  HPI  No major complaints.  Recheck of health problems. HBP control good via home BP record and today's BP. DM - metformen stopped last fall due to low blood sugars. Chronic diarrhea, no blood.  Important that this symptom occurs while patient not on metformen.    Review of Systems     Objective:   Physical Exam Lungs clear Cardiac RRR without m or g Abd benign Ext trace edema       Assessment & Plan:

## 2011-10-01 ENCOUNTER — Other Ambulatory Visit: Payer: Self-pay | Admitting: Family Medicine

## 2011-10-01 DIAGNOSIS — Z1231 Encounter for screening mammogram for malignant neoplasm of breast: Secondary | ICD-10-CM

## 2011-10-29 ENCOUNTER — Ambulatory Visit
Admission: RE | Admit: 2011-10-29 | Discharge: 2011-10-29 | Disposition: A | Discharge status: Home / Self Care | Payer: Medicare Other | Source: Ambulatory Visit | Attending: Family Medicine | Admitting: Family Medicine

## 2011-10-29 DIAGNOSIS — Z1231 Encounter for screening mammogram for malignant neoplasm of breast: Secondary | ICD-10-CM

## 2011-11-19 ENCOUNTER — Ambulatory Visit
Admission: RE | Admit: 2011-11-19 | Discharge: 2011-11-19 | Disposition: A | Discharge status: Home / Self Care | Payer: Medicare Other | Source: Ambulatory Visit | Attending: Family Medicine | Admitting: Family Medicine

## 2011-11-19 ENCOUNTER — Ambulatory Visit (INDEPENDENT_AMBULATORY_CARE_PROVIDER_SITE_OTHER): Payer: Medicare Other | Admitting: Family Medicine

## 2011-11-19 ENCOUNTER — Encounter: Payer: Self-pay | Admitting: Family Medicine

## 2011-11-19 VITALS — BP 127/76 | HR 75 | Temp 97.0°F | Ht 60.0 in | Wt 191.6 lb

## 2011-11-19 DIAGNOSIS — M25559 Pain in unspecified hip: Secondary | ICD-10-CM

## 2011-11-19 DIAGNOSIS — E119 Type 2 diabetes mellitus without complications: Secondary | ICD-10-CM

## 2011-11-19 DIAGNOSIS — M25551 Pain in right hip: Secondary | ICD-10-CM | POA: Insufficient documentation

## 2011-11-19 LAB — POCT GLYCOSYLATED HEMOGLOBIN (HGB A1C): Hemoglobin A1C: 6.8

## 2011-11-19 NOTE — Patient Instructions (Signed)
Stop the metformen for 4 days and see if that changes your diarrhea.  Get your hip x rays and I will call with results. A good non specific help for hip pain is to use a cane.  Use in your left hand.

## 2011-11-20 NOTE — Progress Notes (Signed)
  Subjective:    Patient ID: [name], female    DOB: May 16, 1941, 71 y.o.   MRN: [id number]  HPI Patient with severe, progressive Rt. Hip pain x 1 month.  No fever, trauma or unusual activity.  She is convinced that the pain originates from the hip.  She does feel the pain also in the thigh, knee and even lower leg.  Her back is not flairing up on her and she states this pain is significantly different than her previous back pain and radiation.  Cannot lay on her right side at night.    Review of Systems     Objective:   Physical Exam Back exam is stable for her with some limited RoM and tenderness Pain to deep palpation of hip.  Pain is poorly localized with some over greater trochanter and some at sciatic notch.  Not much at [facility] joint.  Knee normal.  Does have pain and limited ROM of internal and external rotation of hip.        Assessment & Plan:

## 2011-11-20 NOTE — Assessment & Plan Note (Signed)
Unclear etiology based on exam.  Agreed to start with hip Xrays.  As I type, the results of those X rays are normal.  Called and we agreed to come in tomorrow for injection of greater trochanter.  Although the exam was not classic for this diagnosis.  Her inability to sleep on her side is a classic symptom.  The injection will be both diagnostic and therapeutic.  If poor response, will refer to sports med.

## 2011-11-21 ENCOUNTER — Ambulatory Visit (INDEPENDENT_AMBULATORY_CARE_PROVIDER_SITE_OTHER): Payer: Medicare Other | Admitting: Family Medicine

## 2011-11-21 ENCOUNTER — Encounter: Payer: Self-pay | Admitting: Family Medicine

## 2011-11-21 VITALS — BP 137/92 | HR 99 | Temp 97.8°F | Ht 60.0 in | Wt 191.0 lb

## 2011-11-21 DIAGNOSIS — M25551 Pain in right hip: Secondary | ICD-10-CM

## 2011-11-21 DIAGNOSIS — M25559 Pain in unspecified hip: Secondary | ICD-10-CM

## 2011-11-21 NOTE — Patient Instructions (Signed)
Let me know how you feel next week.

## 2011-11-21 NOTE — Assessment & Plan Note (Signed)
Greater trochanteric bursitis, perhaps with some ITB element is my working diagnosis.  Pleased with immediate response to injection.

## 2011-11-21 NOTE — Progress Notes (Signed)
  Subjective:    Patient ID: [name], female    DOB: [birth year], 71 y.o.   MRN: [id number]  HPI See previous note and telephone note.  Despite non classic presentation, will try greater trochanter bursa injection   Review of Systems     Objective:   Physical Exam Informed consent and time out.  Located greater trochanter which was her area of max pain 40mg  kenalog and 3 cc xylocaine infiltrated with good immediate relief of symptoms.       Assessment & Plan:

## 2011-11-26 ENCOUNTER — Telehealth: Payer: Self-pay | Admitting: Family Medicine

## 2011-11-26 NOTE — Telephone Encounter (Signed)
Not limping but is having trouble laying on right side and also pain is getting worse and was told to call in if not better.

## 2011-11-26 NOTE — Telephone Encounter (Signed)
Returned call to patient.  Had a cortisone injection by Dr. [name] due to right hip bursitis.  Had x-ray and "hip is ok."  Cortisone did help some---limp is improved, but patient still has right thigh, calf, and buttock pain.  Unable to lie on right side and is unable to put pressure on right buttock when sitting.  Patient is not taking any meds for pain, but is using heat with some relief.  Follow up appt made with Dr. [name] for 12/12/11 at 11:30am.  Patient declined earlier appt to see another doctor.  Prefers to wait and see Dr. [name], but will call back for earlier appt if pain worsens.  Gaylene Brooks, RN

## 2011-11-27 NOTE — Telephone Encounter (Signed)
Called and gave her the option of sports med.  She will keep appointment with me.  Also, diarrhea improved off metformen.  She will stay off and see if she can be diet controled with her wt loss.

## 2011-12-01 ENCOUNTER — Other Ambulatory Visit: Payer: Self-pay | Admitting: Family Medicine

## 2011-12-12 ENCOUNTER — Encounter: Payer: Self-pay | Admitting: Family Medicine

## 2011-12-12 ENCOUNTER — Ambulatory Visit (INDEPENDENT_AMBULATORY_CARE_PROVIDER_SITE_OTHER): Payer: Medicare Other | Admitting: Family Medicine

## 2011-12-12 VITALS — BP 120/80 | HR 72 | Temp 97.8°F | Wt 188.0 lb

## 2011-12-12 DIAGNOSIS — M25551 Pain in right hip: Secondary | ICD-10-CM

## 2011-12-12 DIAGNOSIS — M25559 Pain in unspecified hip: Secondary | ICD-10-CM

## 2011-12-12 DIAGNOSIS — E119 Type 2 diabetes mellitus without complications: Secondary | ICD-10-CM

## 2011-12-12 NOTE — Progress Notes (Signed)
  Subjective:    Patient ID: [name], female    DOB: 04-17-1941, 71 y.o.   MRN: [id number]  HPI Patient with continued Rt. Hip pain.  To recap: plane films negative.  I tried steroid injection of greater trochanter and got rid of limping and some of her pain.  She still has considerable discomfort, beginning in the buttocks and traveling down the postero lateral hip to the knee.  Denies worsening of her chronic back pain.  "I have three ruptured discs."  In lumbar spine.  "but this pain is very different from my pinched nerve pain which was mostly on the left.    Review of Systems     Objective:   Physical Exam Mild paraspinous lumbar pain.  No pain over SI joints.  Exquisite tenderness over Rt. Sciatic notch.  Also moderate tenderness over Rt greater trochanter.  Some hip pain with internal and external rotation.        Assessment & Plan:

## 2011-12-12 NOTE — Assessment & Plan Note (Signed)
Now apparently diet control with great weight loss.  Recheck in July.  If need be, can try low dose, immediate release metformin at that time.

## 2011-12-12 NOTE — Assessment & Plan Note (Signed)
I still think there is a component of Greater trochanteric bursitis.  Her exam also suggests possible pyraformis syndrome.  Discussed options.  Because I am still optimistic that a steroid injection in the right location might considerably help her pain, I will refer to Sports Med.  My next option will be physical therapy.

## 2011-12-22 ENCOUNTER — Ambulatory Visit (INDEPENDENT_AMBULATORY_CARE_PROVIDER_SITE_OTHER): Payer: Medicare Other | Admitting: Family Medicine

## 2011-12-22 VITALS — BP 136/87

## 2011-12-22 DIAGNOSIS — M25559 Pain in unspecified hip: Secondary | ICD-10-CM

## 2011-12-22 DIAGNOSIS — M25551 Pain in right hip: Secondary | ICD-10-CM

## 2011-12-22 NOTE — Progress Notes (Signed)
  Subjective:    Patient ID: [name], female    DOB: 1941-07-14, 71 y.o.   MRN: [id number]  HPI Right hip and thigh pain since about February. Came on rather suddenly but no acute injury. Has worsened over the last few weeks. Pain is in the right lateral and posterior hip with radiation into the right anterior thigh. Has been limping some. Received a corticosteroid injection from her PCP and that seemed to help to limp a little bit but the pain is still quite intense. She cannot sit comfortably and has to sit with most of her weight on her left buttock. Pain is 6-8/10 at its worst. It is waking her up at night. She had x-ray which was reportedly normal.  PERTINENT  PMH / PSH: History of herniated discs in her back. I see no current imaging for that. She does say that this pain does not feel like her typical back pain that she's had in the past.   Review of Systems    denies incontinence of urine or feces. Denies numbness in her right lower extremity. Denies fever, sweats, chills, unusual weight change. Objective:   Physical Exam  Vital signs reviewed. GENERAL: Well developed, well nourished, no acute distress  GAIT: Antalgic favoring the right leg. Her right foot also seems to have a very mild foot drop. HIPS: Internal/external rotation is normal and painless. External rotation on the right hip with axial loading against the femur causes intense pain in the right posterior buttock/hip area. Mildly tender to palpation over the right greater trochanteric bursa area but this does not reproduce her pain. The right lateral quadricep is also tender to palpation. Hip flexor strength is 5 out of 5 equal he. Abduction and abduction 5 out of 5 symmetrical. Plantar flexion 5 out of 5 bilaterally. Dorsiflexion is slightly weaker on the right but still essentially 5 out of 5. NEURO: DTRs are not elicitable at the knee but bilaterally at the ankle are brisk.     Assessment & Plan:  #1. Right hip  pain. Somewhat atypical pain. I think this is probably coming from her hip. Even given her history of herniated discs in her low back this seems to be more likely hip pathology so we will start with MRI of the hip. If that is totally negative then I suppose we will image her LS spine first with film and then potentially with MRI. I discussed this at length with her. She agrees with our plan.

## 2011-12-22 NOTE — Patient Instructions (Addendum)
You have been scheduled for an appointment for MRI of your right hip on 12/24/11 at 5:15pm at -Authroization number is [id number].   [facility] [address] [place] [phone]

## 2011-12-24 ENCOUNTER — Ambulatory Visit
Admission: RE | Admit: 2011-12-24 | Discharge: 2011-12-24 | Disposition: A | Discharge status: Home / Self Care | Payer: Medicare Other | Source: Ambulatory Visit | Attending: Family Medicine | Admitting: Family Medicine

## 2011-12-24 DIAGNOSIS — M25551 Pain in right hip: Secondary | ICD-10-CM

## 2011-12-26 ENCOUNTER — Telehealth: Payer: Self-pay | Admitting: *Deleted

## 2011-12-26 ENCOUNTER — Telehealth: Payer: Self-pay | Admitting: Family Medicine

## 2011-12-26 NOTE — Telephone Encounter (Signed)
Pt called requesting MRI results.  Per Dr. Jennette Kettle advised pt that she has some fluid in her hip that should not be there, and some arthritic changes. Dr. Jennette Kettle advises pt should either see an ortho doctor and get an MRI of her back or come back her for another hip injection.  Pt states she will call and let us know her decision.

## 2011-12-26 NOTE — Telephone Encounter (Signed)
[name] discussed with her over phone. Hip effusion is a little abnormal---could be from degenerative change but still a little unclear.I recommend ortho referral---we could also image her LS spne but I still think there is something funny aboutthis hip. She is gooing to think about it and let me know if she wants to proceed w referral to Ortho or do back MRI [name]

## 2012-01-27 ENCOUNTER — Other Ambulatory Visit: Payer: Self-pay | Admitting: Family Medicine

## 2012-02-11 ENCOUNTER — Other Ambulatory Visit: Payer: Self-pay | Admitting: Family Medicine

## 2012-02-11 DIAGNOSIS — I10 Essential (primary) hypertension: Secondary | ICD-10-CM

## 2012-02-18 ENCOUNTER — Ambulatory Visit (INDEPENDENT_AMBULATORY_CARE_PROVIDER_SITE_OTHER): Payer: Medicare Other | Admitting: Family Medicine

## 2012-02-18 ENCOUNTER — Ambulatory Visit: Payer: Medicare Other

## 2012-02-18 ENCOUNTER — Encounter: Payer: Self-pay | Admitting: Family Medicine

## 2012-02-18 VITALS — BP 137/77 | HR 76 | Temp 97.8°F | Ht 60.0 in | Wt 188.0 lb

## 2012-02-18 DIAGNOSIS — E7800 Pure hypercholesterolemia, unspecified: Secondary | ICD-10-CM

## 2012-02-18 DIAGNOSIS — E119 Type 2 diabetes mellitus without complications: Secondary | ICD-10-CM

## 2012-02-18 DIAGNOSIS — Z13820 Encounter for screening for osteoporosis: Secondary | ICD-10-CM

## 2012-02-18 LAB — POCT GLYCOSYLATED HEMOGLOBIN (HGB A1C): Hemoglobin A1C: 7.2

## 2012-02-20 NOTE — Assessment & Plan Note (Signed)
A1C not at goal but did receive steroid hip injection.  Now BS normal.  Will hold meds and she will continue to follow home sugars.

## 2012-02-20 NOTE — Assessment & Plan Note (Signed)
Check bone density. 

## 2012-02-20 NOTE — Assessment & Plan Note (Signed)
Check lipid panel fasting.

## 2012-02-20 NOTE — Progress Notes (Signed)
  Subjective:    Patient ID: [name], female    DOB: [birth year], 71 y.o.   MRN: [id number]  HPI Doing well.  She has great BP readings at home visits.  Also the BS at home have been good.   Wt has been stable.  Knows she is due for eye visit.  She will schedule and get me documentation. Also due for bone density and lipid panel    Review of Systems     Objective:   Physical Exam Lungs clear Cardiac RRR without m or g       Assessment & Plan:

## 2012-02-20 NOTE — Patient Instructions (Signed)
Keep an eye on your home blood sugars.  If they go back up, I will start you on meds again.  Otherwise see me in three months.

## 2012-03-10 ENCOUNTER — Other Ambulatory Visit: Payer: Self-pay | Admitting: Family Medicine

## 2012-03-17 ENCOUNTER — Other Ambulatory Visit: Payer: Self-pay | Admitting: Family Medicine

## 2012-03-31 ENCOUNTER — Telehealth: Payer: Self-pay | Admitting: Family Medicine

## 2012-03-31 DIAGNOSIS — E119 Type 2 diabetes mellitus without complications: Secondary | ICD-10-CM

## 2012-03-31 MED ORDER — METFORMIN HCL 500 MG PO TABS: 250.0000 mg | ORAL_TABLET | Freq: Two times a day (BID) | ORAL | Status: DC

## 2012-03-31 NOTE — Telephone Encounter (Signed)
States that her BS has been gradually going up and today is 131 - wants to know what she should do.   Cell# [phone]

## 2012-03-31 NOTE — Telephone Encounter (Signed)
Advised patient that this message will be sent to MD.. States prior to this week BS 110- 119, this week BS 122, 129, 131 fasting before breakfast. Will forward to Dr. [name] for instructions.

## 2012-03-31 NOTE — Assessment & Plan Note (Signed)
Will restart metformin at lower dose.  (had hypoglycemia on 500 bid)

## 2012-05-10 ENCOUNTER — Other Ambulatory Visit: Payer: Self-pay | Admitting: Family Medicine

## 2012-05-14 ENCOUNTER — Other Ambulatory Visit: Payer: Medicare Other

## 2012-05-14 ENCOUNTER — Other Ambulatory Visit: Payer: Self-pay | Admitting: Family Medicine

## 2012-05-14 DIAGNOSIS — M8580 Other specified disorders of bone density and structure, unspecified site: Secondary | ICD-10-CM

## 2012-05-14 DIAGNOSIS — E7800 Pure hypercholesterolemia, unspecified: Secondary | ICD-10-CM

## 2012-05-14 LAB — COMPLETE METABOLIC PANEL WITH GFR
ALT: 27 U/L (ref 0–35)
AST: 34 U/L (ref 0–37)
Albumin: 4.3 g/dL (ref 3.5–5.2)
Alkaline Phosphatase: 88 U/L (ref 39–117)
BUN: 15 mg/dL (ref 6–23)
CO2: 24 mEq/L (ref 19–32)
Calcium: 9.1 mg/dL (ref 8.4–10.5)
Chloride: 109 mEq/L (ref 96–112)
Creat: 0.98 mg/dL (ref 0.50–1.10)
GFR, Est African American: 68 mL/min
GFR, Est Non African American: 59 mL/min — ABNORMAL LOW
Glucose, Bld: 121 mg/dL — ABNORMAL HIGH (ref 70–99)
Potassium: 4.2 mEq/L (ref 3.5–5.3)
Sodium: 142 mEq/L (ref 135–145)
Total Bilirubin: 0.4 mg/dL (ref 0.3–1.2)
Total Protein: 6.5 g/dL (ref 6.0–8.3)

## 2012-05-14 LAB — LIPID PANEL
Cholesterol: 142 mg/dL (ref 0–200)
HDL: 50 mg/dL (ref >39)
LDL Cholesterol: 67 mg/dL (ref 0–99)
Total CHOL/HDL Ratio: 2.8 Ratio
Triglycerides: 126 mg/dL (ref <150)
VLDL: 25 mg/dL (ref 0–40)

## 2012-05-14 NOTE — Addendum Note (Signed)
Addended by: Farrell Ours on: 05/14/2012 09:29 AM   Modules accepted: Orders

## 2012-05-14 NOTE — Progress Notes (Signed)
Drew cbc, flp

## 2012-05-17 ENCOUNTER — Encounter: Payer: Self-pay | Admitting: Family Medicine

## 2012-05-19 ENCOUNTER — Ambulatory Visit (INDEPENDENT_AMBULATORY_CARE_PROVIDER_SITE_OTHER): Payer: Medicare Other | Admitting: Family Medicine

## 2012-05-19 ENCOUNTER — Encounter: Payer: Self-pay | Admitting: Family Medicine

## 2012-05-19 ENCOUNTER — Ambulatory Visit (HOSPITAL_COMMUNITY)
Admission: RE | Admit: 2012-05-19 | Discharge: 2012-05-19 | Disposition: A | Discharge status: Home / Self Care | Payer: Medicare Other | Source: Ambulatory Visit | Attending: Family Medicine | Admitting: Family Medicine

## 2012-05-19 VITALS — BP 133/75 | HR 88 | Ht 60.0 in | Wt 195.0 lb

## 2012-05-19 DIAGNOSIS — H532 Diplopia: Secondary | ICD-10-CM | POA: Insufficient documentation

## 2012-05-19 DIAGNOSIS — E119 Type 2 diabetes mellitus without complications: Secondary | ICD-10-CM

## 2012-05-19 DIAGNOSIS — I10 Essential (primary) hypertension: Secondary | ICD-10-CM

## 2012-05-19 DIAGNOSIS — R9431 Abnormal electrocardiogram [ECG] [EKG]: Secondary | ICD-10-CM | POA: Insufficient documentation

## 2012-05-19 DIAGNOSIS — G459 Transient cerebral ischemic attack, unspecified: Secondary | ICD-10-CM

## 2012-05-19 LAB — POCT GLYCOSYLATED HEMOGLOBIN (HGB A1C): Hemoglobin A1C: 7

## 2012-05-19 NOTE — Patient Instructions (Addendum)
I am worried that you are having a TIA = mini stroke. I will call with lab results. Sign up for my chart.

## 2012-05-20 NOTE — Assessment & Plan Note (Signed)
Well controled, but given risk factors for CAD, did repeat EKG to RO silent MI during day she was fatigued.

## 2012-05-20 NOTE — Assessment & Plan Note (Signed)
I am concerned with TIA, CVA.  Will check MRI and dopplers.  No echo since no m or palpitations.

## 2012-05-20 NOTE — Progress Notes (Signed)
  Subjective:    Patient ID: [name], female    DOB: 1940-10-21, 71 y.o.   MRN: [id number]  HPI  Vague but disturbing episodic symptoms. 3 weeks ago, awoke with extreme fatigue and generalized weakness.  She checked BP and BS, both of which were normal.  The symptoms lasted a full day and resolved by the time she awoke the next morning.  More specifically, she has had two episodes of diplopia, lasting hours.  Resolved on own.  Recent eye exam normal.  Compliant with meds including ASA.  Again, BP and BS were normal during these spells.  Multiple risk factors for CAD and CVA including strong FHx.  Her chronic med problems seem well controled.     Review of Systems     Objective:   Physical ExamEOMI, PERRLA, conjugate gaze. Lungs clear Cardiac RRR without m or g Neuro, WNL        Assessment & Plan:

## 2012-05-21 ENCOUNTER — Ambulatory Visit
Admission: RE | Admit: 2012-05-21 | Discharge: 2012-05-21 | Disposition: A | Discharge status: Home / Self Care | Payer: Medicare Other | Source: Ambulatory Visit | Attending: Family Medicine | Admitting: Family Medicine

## 2012-05-21 DIAGNOSIS — M8580 Other specified disorders of bone density and structure, unspecified site: Secondary | ICD-10-CM

## 2012-05-25 ENCOUNTER — Other Ambulatory Visit: Payer: Medicare Other

## 2012-05-25 ENCOUNTER — Ambulatory Visit
Admission: RE | Admit: 2012-05-25 | Discharge: 2012-05-25 | Disposition: A | Discharge status: Home / Self Care | Payer: Medicare Other | Source: Ambulatory Visit | Attending: Family Medicine | Admitting: Family Medicine

## 2012-05-25 DIAGNOSIS — G459 Transient cerebral ischemic attack, unspecified: Secondary | ICD-10-CM

## 2012-05-25 DIAGNOSIS — E119 Type 2 diabetes mellitus without complications: Secondary | ICD-10-CM

## 2012-05-25 DIAGNOSIS — I10 Essential (primary) hypertension: Secondary | ICD-10-CM

## 2012-05-27 ENCOUNTER — Ambulatory Visit (HOSPITAL_COMMUNITY)
Admission: RE | Admit: 2012-05-27 | Discharge: 2012-05-27 | Disposition: A | Discharge status: Home / Self Care | Payer: Medicare Other | Source: Ambulatory Visit | Attending: Family Medicine | Admitting: Family Medicine

## 2012-05-27 DIAGNOSIS — E7800 Pure hypercholesterolemia, unspecified: Secondary | ICD-10-CM | POA: Insufficient documentation

## 2012-05-27 DIAGNOSIS — G459 Transient cerebral ischemic attack, unspecified: Secondary | ICD-10-CM

## 2012-05-27 DIAGNOSIS — E119 Type 2 diabetes mellitus without complications: Secondary | ICD-10-CM | POA: Insufficient documentation

## 2012-05-27 DIAGNOSIS — I10 Essential (primary) hypertension: Secondary | ICD-10-CM | POA: Insufficient documentation

## 2012-05-27 DIAGNOSIS — R42 Dizziness and giddiness: Secondary | ICD-10-CM | POA: Insufficient documentation

## 2012-05-27 NOTE — Progress Notes (Signed)
Bilateral:  No evidence of hemodynamically significant internal carotid artery stenosis.   Vertebral artery flow is antegrade.     

## 2012-05-31 ENCOUNTER — Other Ambulatory Visit: Payer: Self-pay | Admitting: Family Medicine

## 2012-05-31 DIAGNOSIS — E119 Type 2 diabetes mellitus without complications: Secondary | ICD-10-CM

## 2012-05-31 MED ORDER — METFORMIN HCL 500 MG PO TABS: 500.0000 mg | ORAL_TABLET | Freq: Two times a day (BID) | ORAL | Status: DC

## 2012-07-14 ENCOUNTER — Ambulatory Visit (INDEPENDENT_AMBULATORY_CARE_PROVIDER_SITE_OTHER): Payer: Medicare Other | Admitting: Family Medicine

## 2012-07-14 ENCOUNTER — Encounter: Payer: Self-pay | Admitting: Family Medicine

## 2012-07-14 VITALS — BP 150/79 | HR 85 | Temp 98.6°F | Ht 60.0 in | Wt 190.0 lb

## 2012-07-14 DIAGNOSIS — H532 Diplopia: Secondary | ICD-10-CM

## 2012-07-14 DIAGNOSIS — I10 Essential (primary) hypertension: Secondary | ICD-10-CM

## 2012-07-14 DIAGNOSIS — E119 Type 2 diabetes mellitus without complications: Secondary | ICD-10-CM

## 2012-07-14 DIAGNOSIS — R300 Dysuria: Secondary | ICD-10-CM

## 2012-07-14 LAB — POCT URINALYSIS DIPSTICK
Bilirubin, UA: NEGATIVE
Blood, UA: NEGATIVE
Glucose, UA: NEGATIVE
Ketones, UA: NEGATIVE
Leukocytes, UA: NEGATIVE
Nitrite, UA: NEGATIVE
Protein, UA: NEGATIVE
Spec Grav, UA: 1.02
Urobilinogen, UA: 0.2
pH, UA: 7

## 2012-07-14 LAB — GLUCOSE, CAPILLARY
Comment 1: 3
Glucose-Capillary: 132 mg/dL — ABNORMAL HIGH (ref 70–99)

## 2012-07-14 LAB — GLUCOSE, POCT (MANUAL RESULT ENTRY): POC Glucose: 132 mg/dl — AB (ref 70–99)

## 2012-07-14 MED ORDER — METFORMIN HCL 500 MG PO TABS: 250.0000 mg | ORAL_TABLET | Freq: Two times a day (BID) | ORAL | Status: DC

## 2012-07-14 MED ORDER — GLIMEPIRIDE 1 MG PO TABS: 1.0000 mg | ORAL_TABLET | Freq: Every day | ORAL | Status: DC

## 2012-07-14 MED ORDER — ENALAPRIL MALEATE 10 MG PO TABS: 10.0000 mg | ORAL_TABLET | Freq: Every day | ORAL | Status: DC

## 2012-07-14 NOTE — Patient Instructions (Addendum)
You are likely correct that the higher dose of metformin is causing the diarrhea.  Go back to only taking a half pill twice a day.  I did not send a new prescription. I did send a new prescription for Amaryl, which is a second medication for diabetes. Please increase your enalapril to a full tab every day and keep a watch on your blood pressure.   Hopefully everything improves, less diarrhea, better blood pressures and better blood sugars.  We shall see.

## 2012-07-15 NOTE — Assessment & Plan Note (Signed)
Cut back metformin and add low dose amaryl

## 2012-07-15 NOTE — Assessment & Plan Note (Signed)
Suboptimal control, increase ACE.

## 2012-07-15 NOTE — Assessment & Plan Note (Signed)
No TIA - ocular problem

## 2012-07-15 NOTE — Progress Notes (Signed)
  Subjective:    Patient ID: [name], female    DOB: 02-02-1941, 71 y.o.   MRN: [id number]  HPI  Worsened diarrhea ever since increased metformin.  BP has been running high both here and on home checks.    Review of Systems     Objective:   Physical Exam BP confirmed.         Assessment & Plan:

## 2012-08-05 ENCOUNTER — Other Ambulatory Visit: Payer: Self-pay | Admitting: Family Medicine

## 2012-08-05 DIAGNOSIS — H532 Diplopia: Secondary | ICD-10-CM

## 2012-08-05 DIAGNOSIS — I10 Essential (primary) hypertension: Secondary | ICD-10-CM

## 2012-08-05 DIAGNOSIS — R300 Dysuria: Secondary | ICD-10-CM

## 2012-08-05 DIAGNOSIS — E119 Type 2 diabetes mellitus without complications: Secondary | ICD-10-CM

## 2012-08-05 MED ORDER — ENALAPRIL MALEATE 10 MG PO TABS: 10.0000 mg | ORAL_TABLET | Freq: Every day | ORAL | Status: DC

## 2012-08-12 ENCOUNTER — Telehealth: Payer: Self-pay | Admitting: Family Medicine

## 2012-08-12 NOTE — Telephone Encounter (Signed)
Please have her continue off the Amaryl and on the Metformin. She was to schedule an appointment with Dr [name] around 08/15/12 and she can report her fasting capilllary blood glucoses to him then.

## 2012-08-12 NOTE — Telephone Encounter (Signed)
Patient is taking Amaryl and she has noticed that on it, her blood sugar drops significantly and she isn't sure if she should continue to take it.

## 2012-08-12 NOTE — Telephone Encounter (Addendum)
Spoke  with patient. States she started Amaryl Dec 4  and decreased metformin to 250 mg twice daily. Since Dec 29 blood sugars  have been 52, 58, 40 consecutively  daily . She stopped it  yesterday and BS was 103 and today 100.  Has continued taking metformin as directed.  States now diarrhea is manageable  Prior to  Dec 29 ,  BS ranged 84 to 104.. Will send message to preceptor.

## 2012-08-12 NOTE — Telephone Encounter (Signed)
Patient notified

## 2012-08-18 ENCOUNTER — Encounter: Payer: Self-pay | Admitting: Family Medicine

## 2012-08-18 ENCOUNTER — Ambulatory Visit (INDEPENDENT_AMBULATORY_CARE_PROVIDER_SITE_OTHER): Payer: Medicare Other | Admitting: Family Medicine

## 2012-08-18 VITALS — BP 138/76 | HR 89 | Temp 99.0°F | Wt 188.9 lb

## 2012-08-18 DIAGNOSIS — I10 Essential (primary) hypertension: Secondary | ICD-10-CM

## 2012-08-18 DIAGNOSIS — E119 Type 2 diabetes mellitus without complications: Secondary | ICD-10-CM

## 2012-08-18 DIAGNOSIS — M25551 Pain in right hip: Secondary | ICD-10-CM

## 2012-08-18 DIAGNOSIS — M25559 Pain in unspecified hip: Secondary | ICD-10-CM

## 2012-08-18 DIAGNOSIS — L2089 Other atopic dermatitis: Secondary | ICD-10-CM

## 2012-08-18 LAB — POCT GLYCOSYLATED HEMOGLOBIN (HGB A1C): Hemoglobin A1C: 6.7

## 2012-08-18 MED ORDER — TRAMADOL HCL 50 MG PO TABS: 50.0000 mg | ORAL_TABLET | Freq: Three times a day (TID) | ORAL | Status: DC | PRN

## 2012-08-18 MED ORDER — FLUOCINONIDE 0.05 % EX OINT: TOPICAL_OINTMENT | Freq: Two times a day (BID) | CUTANEOUS | Status: DC

## 2012-08-18 NOTE — Patient Instructions (Signed)
You have two prescriptions at [facility] One is for a high potency steroid ointment to only use on your hands and feet. The other is for tramadol for your hip pain.   I froze a actinic keratosis (precancerous skin thing) off your left cheek.  Let me know if it doesn't heal up as expected Keep an eye on your blood sugar and blood pressure.  No changes for now.

## 2012-08-20 NOTE — Assessment & Plan Note (Addendum)
Seems OK off glimiperide stay off due to concern for hypoglycemia.

## 2012-08-20 NOTE — Assessment & Plan Note (Signed)
Good control

## 2012-08-20 NOTE — Assessment & Plan Note (Signed)
Refill med, no injection for now.

## 2012-08-20 NOTE — Progress Notes (Signed)
  Subjective:    Patient ID: [name], female    DOB: 12-05-1940, 72 y.o.   MRN: [id number]  HPI  C/O dry, cracked hands.  Has used triamcinolone with only partial help. BP readings at home have been good.   BS had to stop amaryl due to hypoglycemia.  Now FBS at home are good.   Having problems again with Rt hip (had greater trochanteric bursitis.)  Would like refill on pain meds.    Review of Systems     Objective:   Physical Exam Hands and feet, dry cracked skin consistent with eczema. Rt hip pain is some localized over greater trochanter.      Assessment & Plan:

## 2012-08-20 NOTE — Assessment & Plan Note (Signed)
Will give high potency steroid for thick skin.

## 2012-09-16 ENCOUNTER — Other Ambulatory Visit: Payer: Self-pay | Admitting: Family Medicine

## 2012-09-27 ENCOUNTER — Telehealth: Payer: Self-pay | Admitting: Family Medicine

## 2012-09-27 DIAGNOSIS — M25551 Pain in right hip: Secondary | ICD-10-CM

## 2012-09-27 DIAGNOSIS — E119 Type 2 diabetes mellitus without complications: Secondary | ICD-10-CM

## 2012-09-27 DIAGNOSIS — H532 Diplopia: Secondary | ICD-10-CM

## 2012-09-27 DIAGNOSIS — I10 Essential (primary) hypertension: Secondary | ICD-10-CM

## 2012-09-27 DIAGNOSIS — R300 Dysuria: Secondary | ICD-10-CM

## 2012-09-27 MED ORDER — TRAMADOL HCL 50 MG PO TABS: 50.0000 mg | ORAL_TABLET | Freq: Three times a day (TID) | ORAL | Status: DC | PRN

## 2012-09-27 MED ORDER — SIMVASTATIN 20 MG PO TABS: 20.0000 mg | ORAL_TABLET | Freq: Every day | ORAL | Status: DC

## 2012-09-27 MED ORDER — FLUTICASONE PROPIONATE HFA 220 MCG/ACT IN AERO: 1.0000 | INHALATION_SPRAY | Freq: Two times a day (BID) | RESPIRATORY_TRACT | Status: DC

## 2012-09-27 MED ORDER — METFORMIN HCL 500 MG PO TABS: 250.0000 mg | ORAL_TABLET | Freq: Two times a day (BID) | ORAL | Status: DC

## 2012-09-27 MED ORDER — DIAZEPAM 2 MG PO TABS: 2.0000 mg | ORAL_TABLET | Freq: Every day | ORAL | Status: DC

## 2012-09-27 MED ORDER — ENALAPRIL MALEATE 10 MG PO TABS: 10.0000 mg | ORAL_TABLET | Freq: Every day | ORAL | Status: DC

## 2012-09-27 MED ORDER — ATENOLOL 25 MG PO TABS: 25.0000 mg | ORAL_TABLET | Freq: Every day | ORAL | Status: DC

## 2012-09-27 MED ORDER — NORTRIPTYLINE HCL 50 MG PO CAPS: 50.0000 mg | ORAL_CAPSULE | Freq: Every day | ORAL | Status: DC

## 2012-09-27 NOTE — Telephone Encounter (Signed)
Patient states she has been on cyclobenzaprine for quite a while and for past 6 months has had dry mouth.  Dentist now tells her she is getting cavities from medication causing dry mouth. She has been getting up at night having to drink around 14 ounces per night. Stopped cyclobenzaprine 1.5 weeks ago and dry mouth is getting better.  She needs something else to replace this medication for her muscle issues.. Will forward to Dr. [name]. ( Patient denies frequent urination.)

## 2012-09-27 NOTE — Telephone Encounter (Signed)
Is having problems with cyclobenzaprine and wants to talk to nurse about it.

## 2012-09-27 NOTE — Telephone Encounter (Signed)
As below.  Likely having anticholinergic side effects.  Will DC cyclobenzaprine and substitute low dose benzo.

## 2012-09-29 ENCOUNTER — Other Ambulatory Visit: Payer: Self-pay | Admitting: *Deleted

## 2012-09-29 DIAGNOSIS — I10 Essential (primary) hypertension: Secondary | ICD-10-CM

## 2012-09-29 DIAGNOSIS — R300 Dysuria: Secondary | ICD-10-CM

## 2012-09-29 DIAGNOSIS — E119 Type 2 diabetes mellitus without complications: Secondary | ICD-10-CM

## 2012-09-29 DIAGNOSIS — H532 Diplopia: Secondary | ICD-10-CM

## 2012-09-29 MED ORDER — ENALAPRIL MALEATE 10 MG PO TABS: 10.0000 mg | ORAL_TABLET | Freq: Every day | ORAL | Status: DC

## 2012-10-06 ENCOUNTER — Other Ambulatory Visit: Payer: Self-pay

## 2012-10-06 ENCOUNTER — Other Ambulatory Visit: Payer: Self-pay | Admitting: Family Medicine

## 2012-10-06 DIAGNOSIS — Z1231 Encounter for screening mammogram for malignant neoplasm of breast: Secondary | ICD-10-CM

## 2012-10-13 ENCOUNTER — Telehealth: Payer: Self-pay | Admitting: Family Medicine

## 2012-10-13 MED ORDER — DIAZEPAM 5 MG PO TABS: 5.0000 mg | ORAL_TABLET | Freq: Every day | ORAL | Status: DC

## 2012-10-13 NOTE — Telephone Encounter (Signed)
Patient is concerned about the elevation of her glucose for fasting being too high on the 1/2 tab of metformin she was prescribed.  Even with the other meds she was given her glucose still high.  Before changing dosage fasting check was avg of 100.  Now 125-135.  Please contact patient asap to discuss ?change.

## 2012-10-13 NOTE — Telephone Encounter (Signed)
Will not make any changes for now

## 2012-10-20 ENCOUNTER — Other Ambulatory Visit: Payer: Self-pay | Admitting: *Deleted

## 2012-10-20 MED ORDER — NORTRIPTYLINE HCL 50 MG PO CAPS: 50.0000 mg | ORAL_CAPSULE | Freq: Every day | ORAL | Status: DC

## 2012-10-27 ENCOUNTER — Other Ambulatory Visit: Payer: Self-pay | Admitting: Family Medicine

## 2012-10-27 MED ORDER — FREESTYLE LANCETS MISC: Status: DC

## 2012-10-27 MED ORDER — GLUCOSE BLOOD VI STRP: ORAL_STRIP | Status: DC

## 2012-10-28 ENCOUNTER — Telehealth: Payer: Self-pay | Admitting: Family Medicine

## 2012-10-28 MED ORDER — GLUCOSE BLOOD VI STRP: ORAL_STRIP | Status: DC

## 2012-10-28 NOTE — Telephone Encounter (Signed)
RX for  Strips sent to [facility].  Patient notified.

## 2012-10-28 NOTE — Telephone Encounter (Signed)
Pt needs enough test strips to last her until mail order can send her the order.  Needs enough for 2 weeks Needs dx code and how often...  1- 2 times daily [facility]

## 2012-10-28 NOTE — Telephone Encounter (Signed)
Pt states she has not tested since yesterday - needs today

## 2012-11-03 ENCOUNTER — Ambulatory Visit
Admission: RE | Admit: 2012-11-03 | Discharge: 2012-11-03 | Disposition: A | Discharge status: Home / Self Care | Payer: Medicare Other | Source: Ambulatory Visit

## 2012-11-03 DIAGNOSIS — Z1231 Encounter for screening mammogram for malignant neoplasm of breast: Secondary | ICD-10-CM

## 2012-12-15 ENCOUNTER — Encounter: Payer: Self-pay | Admitting: Family Medicine

## 2012-12-15 ENCOUNTER — Ambulatory Visit (INDEPENDENT_AMBULATORY_CARE_PROVIDER_SITE_OTHER): Payer: Medicare Other | Admitting: Family Medicine

## 2012-12-15 VITALS — BP 114/63 | HR 78 | Temp 98.9°F | Ht 60.0 in | Wt 187.0 lb

## 2012-12-15 DIAGNOSIS — R05 Cough: Secondary | ICD-10-CM

## 2012-12-15 DIAGNOSIS — R059 Cough, unspecified: Secondary | ICD-10-CM

## 2012-12-15 DIAGNOSIS — J209 Acute bronchitis, unspecified: Secondary | ICD-10-CM | POA: Insufficient documentation

## 2012-12-15 DIAGNOSIS — G43909 Migraine, unspecified, not intractable, without status migrainosus: Secondary | ICD-10-CM

## 2012-12-15 DIAGNOSIS — Z23 Encounter for immunization: Secondary | ICD-10-CM

## 2012-12-15 DIAGNOSIS — E119 Type 2 diabetes mellitus without complications: Secondary | ICD-10-CM

## 2012-12-15 DIAGNOSIS — E7800 Pure hypercholesterolemia, unspecified: Secondary | ICD-10-CM

## 2012-12-15 DIAGNOSIS — M479 Spondylosis, unspecified: Secondary | ICD-10-CM

## 2012-12-15 LAB — POCT GLYCOSYLATED HEMOGLOBIN (HGB A1C): Hemoglobin A1C: 7.4

## 2012-12-15 MED ORDER — ENALAPRIL MALEATE 10 MG PO TABS: 10.0000 mg | ORAL_TABLET | Freq: Every day | ORAL | Status: DC

## 2012-12-15 MED ORDER — CYCLOBENZAPRINE HCL 10 MG PO TABS: 10.0000 mg | ORAL_TABLET | Freq: Every day | ORAL | Status: DC

## 2012-12-15 MED ORDER — GLUCOSE BLOOD VI STRP: ORAL_STRIP | Status: DC

## 2012-12-15 MED ORDER — BACLOFEN 10 MG PO TABS: 10.0000 mg | ORAL_TABLET | Freq: Four times a day (QID) | ORAL | Status: DC | PRN

## 2012-12-15 MED ORDER — BENZONATATE 100 MG PO CAPS: 100.0000 mg | ORAL_CAPSULE | Freq: Two times a day (BID) | ORAL | Status: DC | PRN

## 2012-12-15 MED ORDER — GLIMEPIRIDE 1 MG PO TABS: 1.0000 mg | ORAL_TABLET | Freq: Every day | ORAL | Status: DC

## 2012-12-15 NOTE — Assessment & Plan Note (Signed)
Discussed high dose statin which is indicated per new guidelines.  Desires to remain on simvastatin 20

## 2012-12-15 NOTE — Patient Instructions (Addendum)
Stop the metforin. Start the glimiperide - watch for low blood sugar spells. I refilled multiple other meds.

## 2012-12-16 ENCOUNTER — Other Ambulatory Visit: Payer: Self-pay | Admitting: *Deleted

## 2012-12-16 DIAGNOSIS — E119 Type 2 diabetes mellitus without complications: Secondary | ICD-10-CM

## 2012-12-16 MED ORDER — FREESTYLE LANCETS MISC: Status: DC

## 2012-12-16 NOTE — Assessment & Plan Note (Signed)
Continue qhs cyclobenzaprine for migraine and osteoarthritis 

## 2012-12-16 NOTE — Assessment & Plan Note (Signed)
Continue qhs cyclobenzaprine for migraine and osteoarthritis

## 2012-12-16 NOTE — Assessment & Plan Note (Signed)
She has intermitant cough from allergies which responds to benzonatate.  Refill

## 2012-12-16 NOTE — Progress Notes (Signed)
  Subjective:    Patient ID: [name], female    DOB: [birth year], 72 y.o.   MRN: [id number]  HPI  [name] is on a complicated medical regimen - that seems to be working very nicely for her.  She had a denial of coverage for qhs flexeril but desires to pay out of pocket.  Much of this visit is reviewing meds and refilling at the appropriate pharmacy.  She also plans a trip to the [place] in the fall.  We visited the CDC travel web site.  We agreed that Hep A vaccination was prudent.  No hx of Hep A.  Feels great, no complaints.    Now having diarrhea while on metformin 250 BID. Has tried sustained release with same diarrhea.  Plus A1C is above goal Was on glimiperide in the past and hypoglycemia - but also on metfromin at that time.    Review of Systems     Objective:   Physical Exam Lungs clear Cardiac RRR without m or g        Assessment & Plan:

## 2012-12-16 NOTE — Assessment & Plan Note (Signed)
DC metformin due to side effects.  Start low dose glimepiride.  Watch for hypoglycemia.

## 2013-01-10 ENCOUNTER — Other Ambulatory Visit: Payer: Self-pay | Admitting: *Deleted

## 2013-01-10 DIAGNOSIS — M25551 Pain in right hip: Secondary | ICD-10-CM

## 2013-01-10 MED ORDER — TRAMADOL HCL 50 MG PO TABS: 50.0000 mg | ORAL_TABLET | Freq: Three times a day (TID) | ORAL | Status: DC | PRN

## 2013-02-14 ENCOUNTER — Telehealth: Payer: Self-pay | Admitting: Family Medicine

## 2013-02-14 DIAGNOSIS — E119 Type 2 diabetes mellitus without complications: Secondary | ICD-10-CM

## 2013-02-14 NOTE — Telephone Encounter (Signed)
Pt is requesting a change in medication or increase dosage. Pt is taking glimepiride but the medication doesn't seem to be working as much. Her sugar levels are still on the high even after she takes the medication. She was on Metformin and it work but the Dr. Rochele Pages her off because it caused diarrhea. She is wanting advice on what to do. JW

## 2013-02-14 NOTE — Assessment & Plan Note (Signed)
See documentation.

## 2013-02-14 NOTE — Telephone Encounter (Signed)
Having generally high blood sugars on glimipiride - but also two episodes of low blood sugar.  Will check A1C to see if at goal.  If not, likely switch to [place].

## 2013-02-15 ENCOUNTER — Other Ambulatory Visit (INDEPENDENT_AMBULATORY_CARE_PROVIDER_SITE_OTHER): Payer: Medicare Other

## 2013-02-15 ENCOUNTER — Other Ambulatory Visit: Payer: Medicare Other

## 2013-02-15 DIAGNOSIS — E119 Type 2 diabetes mellitus without complications: Secondary | ICD-10-CM

## 2013-02-15 LAB — POCT GLYCOSYLATED HEMOGLOBIN (HGB A1C): Hemoglobin A1C: 7.1

## 2013-02-15 NOTE — Progress Notes (Signed)
A1C DONE TODAY [name]

## 2013-02-28 ENCOUNTER — Telehealth: Payer: Self-pay | Admitting: Family Medicine

## 2013-02-28 DIAGNOSIS — M25551 Pain in right hip: Secondary | ICD-10-CM

## 2013-02-28 NOTE — Telephone Encounter (Signed)
If she is not at home, please call her cell #.  Her blood sugars have dropped 2 days in a row and she isn't sure if Dr. Leveda Grant just wants to adjust her meds or if he wants her to come in.

## 2013-03-01 MED ORDER — SITAGLIPTIN PHOSPHATE 25 MG PO TABS: 25.0000 mg | ORAL_TABLET | Freq: Every day | ORAL | Status: DC

## 2013-03-01 MED ORDER — TRAMADOL HCL 50 MG PO TABS: 50.0000 mg | ORAL_TABLET | Freq: Three times a day (TID) | ORAL | Status: DC | PRN

## 2013-03-01 NOTE — Telephone Encounter (Signed)
Patient reports on 02/27/13, FBS was 128.  At 3:40 pm, CBG was 61.  Pt was eating a peanut butter/jelly sandwich when she started having symptoms of low blood sugar.  After drinking orange juice and eating 2 lifesavers, after 30 mins, CBG was 67.  At 5 pm, CBG was 150. On 02/28/13, FBS was 127.  At 3:15 pm, CBG was 61 again.  After 30 mins, CBG was 160. Pt taking Amaryl 1mg  once a day.  Will fwd to PCP for advice.  Talesha Ellithorpe, Darlyne Russian, CMA

## 2013-03-01 NOTE — Telephone Encounter (Signed)
Switch to Januvia.  Hypoglycemia on glimiperide and GI intolerance to metformin.

## 2013-03-28 ENCOUNTER — Telehealth: Payer: Self-pay | Admitting: Family Medicine

## 2013-03-28 DIAGNOSIS — E119 Type 2 diabetes mellitus without complications: Secondary | ICD-10-CM

## 2013-03-28 NOTE — Telephone Encounter (Signed)
Pt is calling and needs some advice on whether to refill her new medcation Januvia. She would like the nurse or doctor to call her so she go over her blood sugar levels to see if Dr. [name] is happy with them JW

## 2013-03-29 MED ORDER — SITAGLIPTIN PHOSPHATE 50 MG PO TABS: 50.0000 mg | ORAL_TABLET | Freq: Every day | ORAL | Status: DC

## 2013-03-29 NOTE — Assessment & Plan Note (Signed)
Bump januvia o 50 mg daily

## 2013-03-29 NOTE — Telephone Encounter (Signed)
Pt states FBS are between 140-160. Should she get Januvia refilled?

## 2013-04-01 ENCOUNTER — Telehealth: Payer: Self-pay | Admitting: Family Medicine

## 2013-04-01 NOTE — Telephone Encounter (Signed)
Pt called and given message - thanks - will route to Dr. Cherylin Mylar, RN-BSN

## 2013-04-01 NOTE — Telephone Encounter (Signed)
Patient is calling because, Dr. [name] increased the dose of her Januvia which can cause Migraines which she already suffers from and since he did this, the Migraines have gotten worse and she has one right now and she doesn't know what to do with the Januvia for the time being so she needs to speak to the doctor who is covering for Dr. [name] as soon as possible.

## 2013-04-01 NOTE — Telephone Encounter (Signed)
[name] Please tell her to go back to her original dose and he will get in touch with her when he gets back  Which will be Sept 1st--then route this mssg to him [facility]! Denny Levy

## 2013-04-01 NOTE — Telephone Encounter (Signed)
Please advise- Elizabeth Eunice Winecoff, RN-BSN  

## 2013-04-12 NOTE — Telephone Encounter (Signed)
Patients blood sugars are still running high and she would like to speak to Dr. [name].

## 2013-04-12 NOTE — Telephone Encounter (Signed)
Spoke with patient and her sugars have been running high. Was on 25mg  went to 50mg . Got migraines from the 50mg  so she was put back on 25mg .  [phone]. Between 8/24-9/2    Patient states that her and her husband have decided that he now wants to do the body scan.

## 2013-04-12 NOTE — Telephone Encounter (Signed)
Called.  She has: Diarrhea on low dose metformen Hypoglycemia on low dose sulfonylurea Migraines on Januvia. I am running out of oral options.  I am reluctant to put on insulin for her mild DM.  Will refer to Dr. Raymondo Band for suggestions of other oral meds.

## 2013-04-14 ENCOUNTER — Ambulatory Visit (INDEPENDENT_AMBULATORY_CARE_PROVIDER_SITE_OTHER): Payer: Medicare Other | Admitting: Pharmacist

## 2013-04-14 ENCOUNTER — Encounter: Payer: Self-pay | Admitting: Pharmacist

## 2013-04-14 VITALS — BP 119/77 | HR 90 | Ht 59.0 in | Wt 189.7 lb

## 2013-04-14 DIAGNOSIS — E119 Type 2 diabetes mellitus without complications: Secondary | ICD-10-CM

## 2013-04-14 MED ORDER — CANAGLIFLOZIN 100 MG PO TABS: 100.0000 mg | ORAL_TABLET | Freq: Every day | ORAL | Status: DC

## 2013-04-14 NOTE — Progress Notes (Signed)
S:    Patient arrives today in good spirits, greets nurses at the counter, and has brought bag of medications with her today . She presents to the clinic for diabetes follow up, specifically because of intolerance (headaches) with Januvia. Patient reports having history of Diabetes since the year of 2008.  Patient has history of intolerance with metformin (diarrhea) and sulfonylureas (hypoglycemia) as well and has taken Januvia since mid-July 2014.   O:    A/P: Diabetes diagnosed in 2008, currently on Januvia Lab Results  Component Value Date   HGBA1C 7.1 02/15/2013   AND reported home fasting CBG readings of 130-170s. Denies hypoglycemic events and is able to verbalize appropriate hypoglycemia management plan, but experiences exacerbation of migraine headaches with Januvia even after dose adjustments.  Reports adherence with medication.   Patient is meeting goals for blood glucose control at this time, but requests a change in medication due to intolerance. We will discontinue Januvia and change to canagliflozin (Invokana) 100mg  once daily. Medication coverage card for 12 months and written patient instructions provided.  Follow up in Pharmacist Clinic Visit in 3 months. Total time in face to face counseling 30 minutes. Patient seen with Valene Bors, PharmD Candidate,  Anthony Sar, PharmD Resident, and Clare Gandy, MD resident.

## 2013-04-14 NOTE — Assessment & Plan Note (Signed)
Diabetes diagnosed in 2008, currently on Januvia Lab Results  Component Value Date   HGBA1C 7.1 02/15/2013   AND reported home fasting CBG readings of 130-170s. Denies hypoglycemic events and is able to verbalize appropriate hypoglycemia management plan, but experiences exacerbation of migraine headaches with Januvia even after dose adjustments.  Reports adherence with medication.   Patient is meeting goals for blood glucose control at this time, but requests a change in medication due to intolerance. We will discontinue Januvia and change to canagliflozin (Invokana) 100mg  once daily. Medication coverage card for 12 months and written patient instructions provided.  Follow up in Pharmacist Clinic Visit in 3 months. Total time in face to face counseling 30 minutes. Patient seen with Valene Bors, PharmD Candidate,  Anthony Sar, PharmD Resident, and Clare Gandy, MD resident.

## 2013-04-14 NOTE — Progress Notes (Signed)
Patient ID: [name], female   DOB: [birth year], 72 y.o.   MRN: [id number] Reviewed: Agree with Dr. Macky Lower documentation and management.

## 2013-04-14 NOTE — Patient Instructions (Addendum)
Stop taking Januvia.  Begin taking 100 mg tablet once daily of the Invokana. This medication causes sugar to come out of your urine.  The biggest risk of this medication is infections in the urinary tract.  Maintain good hygiene and the risk is low. Please contact us if you experience any itching.  You can check your sugar levels once a week. Also may help your blood pressure and may help with your weight.  Come see [place] again in 3 months. Let us know if you have any problems.

## 2013-04-20 ENCOUNTER — Telehealth: Payer: Self-pay | Admitting: Family Medicine

## 2013-04-20 NOTE — Telephone Encounter (Signed)
Pt called because is on a new medication and she is having  A bad reaction to this medication. She has a yeast infection and also rip or tear in vagina that is bleeding. She is unsure what to do. JW

## 2013-04-20 NOTE — Telephone Encounter (Signed)
Patient calls again. Spoke to Dr. Raymondo Band. He states she can continue to take medicine. Also, spoke to Dr. [name] and he will call in script for patient for yeast inf.

## 2013-04-21 MED ORDER — FLUCONAZOLE 150 MG PO TABS: 150.0000 mg | ORAL_TABLET | Freq: Once | ORAL | Status: DC

## 2013-04-21 NOTE — Telephone Encounter (Signed)
RX sent

## 2013-04-21 NOTE — Telephone Encounter (Signed)
Pt called. The pharmacy states they have not received the prescription for the yeast infection. Pharmacy is [facility] Please advise

## 2013-04-25 ENCOUNTER — Telehealth: Payer: Self-pay | Admitting: Family Medicine

## 2013-04-25 NOTE — Telephone Encounter (Signed)
Called and left message that she will need an appointment.

## 2013-04-25 NOTE — Telephone Encounter (Signed)
Pt called to let Dr. [name] know that the UTI medication he called has not completely gotten rid of her UTI, Also the other medication that Dr. Raymondo Band has her does not seem to be working. Her blood sugar since 9/5 has been between high 150 to the low 160, and today it was 170. She is unsure now what the next steps should be. JW

## 2013-04-25 NOTE — Telephone Encounter (Signed)
After talking to patient,she was schedule to see Dr [name] for a recurrent yeast infection and questionable uti.. [name], Virgel Bouquet

## 2013-04-25 NOTE — Telephone Encounter (Signed)
Pt called to clarify her earlier message. She had a yeast infection and she thinks that the medication that Dr. Raymondo Band gave and the medication for the yeast infection was possibly making her blood sugar levels off and also that maybe the new medication just isnt working and wants to know if she should see Dr. Raymondo Band or Dr. [name]. JW

## 2013-04-25 NOTE — Telephone Encounter (Signed)
Pt called and doesn't want an appointment and wants Dr. [name] call her. JW

## 2013-04-27 ENCOUNTER — Ambulatory Visit (INDEPENDENT_AMBULATORY_CARE_PROVIDER_SITE_OTHER): Payer: Medicare Other | Admitting: *Deleted

## 2013-04-27 DIAGNOSIS — Z23 Encounter for immunization: Secondary | ICD-10-CM

## 2013-04-29 ENCOUNTER — Encounter: Payer: Self-pay | Admitting: Family Medicine

## 2013-04-29 ENCOUNTER — Ambulatory Visit (INDEPENDENT_AMBULATORY_CARE_PROVIDER_SITE_OTHER): Payer: Medicare Other | Admitting: Family Medicine

## 2013-04-29 VITALS — BP 121/75 | HR 80 | Temp 98.1°F | Ht 59.0 in | Wt 186.0 lb

## 2013-04-29 DIAGNOSIS — G43909 Migraine, unspecified, not intractable, without status migrainosus: Secondary | ICD-10-CM

## 2013-04-29 DIAGNOSIS — E119 Type 2 diabetes mellitus without complications: Secondary | ICD-10-CM

## 2013-04-29 MED ORDER — TOPIRAMATE 50 MG PO TABS: 150.0000 mg | ORAL_TABLET | Freq: Every day | ORAL | Status: DC

## 2013-04-29 NOTE — Patient Instructions (Addendum)
Call me next Weds.  I have three choices. 1. Prescribe invokana for 2, 100 mg pills per day 2. Prescribe invokana for 1, 300 mg pills per day 3. Prescribe an older, weaker version of the pill that caused low blood sugars.

## 2013-04-29 NOTE — Assessment & Plan Note (Signed)
Will try increase invokana.  Also count try an older, less potent sulfonylurea.

## 2013-04-29 NOTE — Progress Notes (Signed)
  Subjective:    Patient ID: [name], female    DOB: June 01, 1941, 72 y.o.   MRN: [id number]  HPI  BS poorly controled on Invokana 100 mg.  Can go up to 300 mg.  Also has had one severe yeast infection.  Review of Systems     Objective:   Physical Exam Abd benign        Assessment & Plan:

## 2013-05-04 ENCOUNTER — Telehealth: Payer: Self-pay | Admitting: Family Medicine

## 2013-05-04 MED ORDER — GLYBURIDE 1.25 MG PO TABS: 1.2500 mg | ORAL_TABLET | Freq: Every day | ORAL | Status: DC

## 2013-05-04 NOTE — Addendum Note (Signed)
Addended by: Moses Manners on: 05/04/2013 05:20 PM   Modules accepted: Orders

## 2013-05-04 NOTE — Telephone Encounter (Signed)
[name] called to let Dr. [name] know what her blood sugar was since last Friday. Fri 171, Sat 150, Sun 129, Mon 152, Tues 143, Wed 151,   Please call her on her cell [phone]. She said that she is out of all her medicine and will leave it up to you on which plan you want to go with. Myriam Jacobson

## 2013-05-04 NOTE — Telephone Encounter (Signed)
Will try another sulfonylurea.  DC invokana

## 2013-05-04 NOTE — Telephone Encounter (Signed)
Patient calls stating that the diabetes meds that Dr. [name] was to sent to pharmacy has not been called in yet. Please advise patient when meds have been sent to pharmacy.

## 2013-05-05 NOTE — Telephone Encounter (Signed)
Spoke w/pt she states new med needs prior auth and that pharmacy sent over for Dr [name] to fill out. Aware Dr [name] is out of office

## 2013-05-18 ENCOUNTER — Telehealth: Payer: Self-pay | Admitting: Family Medicine

## 2013-05-18 NOTE — Telephone Encounter (Signed)
Patient received an cortisone injection yesterday. When she checked her BS this morning it was running at 414. One hour later it was 315. Patient would like to know what to do.

## 2013-05-18 NOTE — Telephone Encounter (Signed)
Called and discussed.  Will increase glyburide and push fluids.

## 2013-05-18 NOTE — Telephone Encounter (Signed)
Returned call to patient.  Had cortisone injection in right hip (bursa) at [facility] yesterday.  CBG has been elevated.  Takes glyburide 1.25 mg daily.  Wants to know if she needs to be seen today or if Dr. [name] has any recommendations for her elevated CBG readings.  Patient's husband has an appt with Dr. [name].  He is planning to cancel the appt and patient wants to know if she needs to be seen in his time slot to discuss CBGs.  Will route note to Dr. [name] for advice and call patient back.  Gaylene Brooks, RN

## 2013-05-19 ENCOUNTER — Other Ambulatory Visit: Payer: Self-pay | Admitting: Family Medicine

## 2013-05-19 MED ORDER — GLYBURIDE 2.5 MG PO TABS: 2.5000 mg | ORAL_TABLET | Freq: Every day | ORAL | Status: DC

## 2013-06-13 ENCOUNTER — Other Ambulatory Visit: Payer: Self-pay | Admitting: Family Medicine

## 2013-06-13 DIAGNOSIS — E7800 Pure hypercholesterolemia, unspecified: Secondary | ICD-10-CM

## 2013-06-13 MED ORDER — SIMVASTATIN 20 MG PO TABS: 20.0000 mg | ORAL_TABLET | Freq: Every day | ORAL | Status: DC

## 2013-06-13 NOTE — Assessment & Plan Note (Signed)
Refilled based on fax request.

## 2013-06-24 ENCOUNTER — Telehealth: Payer: Self-pay | Admitting: Family Medicine

## 2013-06-24 MED ORDER — GLYBURIDE 5 MG PO TABS: 5.0000 mg | ORAL_TABLET | Freq: Every day | ORAL | Status: DC

## 2013-06-24 NOTE — Telephone Encounter (Signed)
Pt called because she wanted to inform Dr. [name] about her blood sugar levels. 06/11/13 she started taking 1 1/2 tabs of 25 mgs glyburide  a day once day her blood sugars were around 120. Starting on 06/19/13 her blood sugars increased to 152, 169, 181, 200, 177, 202. What she needs to know should she increase the dosage again. JW

## 2013-07-29 ENCOUNTER — Other Ambulatory Visit: Payer: Self-pay | Admitting: Family Medicine

## 2013-07-29 DIAGNOSIS — E7800 Pure hypercholesterolemia, unspecified: Secondary | ICD-10-CM

## 2013-07-29 DIAGNOSIS — E119 Type 2 diabetes mellitus without complications: Secondary | ICD-10-CM

## 2013-08-09 ENCOUNTER — Ambulatory Visit (INDEPENDENT_AMBULATORY_CARE_PROVIDER_SITE_OTHER): Payer: Medicare Other | Admitting: Family Medicine

## 2013-08-09 ENCOUNTER — Encounter: Payer: Self-pay | Admitting: Family Medicine

## 2013-08-09 VITALS — BP 127/82 | HR 95 | Temp 98.8°F | Ht 59.0 in | Wt 190.3 lb

## 2013-08-09 DIAGNOSIS — E119 Type 2 diabetes mellitus without complications: Secondary | ICD-10-CM

## 2013-08-09 DIAGNOSIS — E7800 Pure hypercholesterolemia, unspecified: Secondary | ICD-10-CM

## 2013-08-09 LAB — LIPID PANEL
Cholesterol: 139 mg/dL (ref 0–200)
HDL: 47 mg/dL (ref >39)
LDL Cholesterol: 72 mg/dL (ref 0–99)
Total CHOL/HDL Ratio: 3 Ratio
Triglycerides: 102 mg/dL (ref <150)
VLDL: 20 mg/dL (ref 0–40)

## 2013-08-09 LAB — POCT GLYCOSYLATED HEMOGLOBIN (HGB A1C): Hemoglobin A1C: 8.8

## 2013-08-09 MED ORDER — GLYBURIDE 5 MG PO TABS: 5.0000 mg | ORAL_TABLET | Freq: Two times a day (BID) | ORAL | Status: DC

## 2013-08-09 NOTE — Assessment & Plan Note (Signed)
Increased glyburide does - with significant cautions about worsening hypoglycemia.  If this fails, will abandon oral agents and switch to lantus insulin.

## 2013-08-09 NOTE — Patient Instructions (Signed)
Let's do the easy thing first.  Increase the glyburide to twice a day with meals. See me in one month.   Watch for low blood sugar spells.

## 2013-08-09 NOTE — Progress Notes (Signed)
   Subjective:    Patient ID: [name], female    DOB: 21-Feb-1941, 72 y.o.   MRN: [id number]  HPI Medical issue is worsening control of DM.  Wt is up, but only four pounds.  She is more active than ever and her stress level is extremely high (see #2 below).  Compliant with meds. She states that despite mostly high blood sugars, she has had a rare hypoglycemic spell.  #2 Husband has been going through extremely rough patch.  After several months of no diagnosis, finally diagnosed lymphoma on splenectomy, which was complicated with adhesions from old nephrectomy and with acute blood loss.  He is headed for chemo in Jan pending second opinion from [place].   Also brother in law died this month.  No SI or HI.  Does not even call herself depressed, just highly stressed.     Review of Systems     Objective:   Physical ExamGen good spirits good affect.        Assessment & Plan:

## 2013-08-17 ENCOUNTER — Telehealth: Payer: Self-pay | Admitting: Family Medicine

## 2013-08-17 DIAGNOSIS — E119 Type 2 diabetes mellitus without complications: Secondary | ICD-10-CM

## 2013-08-17 MED ORDER — GLYBURIDE 5 MG PO TABS: 10.0000 mg | ORAL_TABLET | Freq: Two times a day (BID) | ORAL | Status: DC

## 2013-08-17 NOTE — Telephone Encounter (Signed)
Pt called because the new medication is not controlling blood sugar. She is still having high levels from 150-185 on a daily basis. She would like to know what she should do. jw

## 2013-08-17 NOTE — Telephone Encounter (Signed)
Told to increase glyburide to 10 mg bid.

## 2013-08-25 ENCOUNTER — Other Ambulatory Visit: Payer: Self-pay | Admitting: Family Medicine

## 2013-08-25 DIAGNOSIS — G43909 Migraine, unspecified, not intractable, without status migrainosus: Secondary | ICD-10-CM

## 2013-08-25 DIAGNOSIS — I10 Essential (primary) hypertension: Secondary | ICD-10-CM

## 2013-08-25 MED ORDER — ATENOLOL 25 MG PO TABS: 25.0000 mg | ORAL_TABLET | Freq: Every day | ORAL | Status: DC

## 2013-08-25 MED ORDER — TRAMADOL HCL 50 MG PO TABS: 50.0000 mg | ORAL_TABLET | Freq: Three times a day (TID) | ORAL | Status: DC | PRN

## 2013-08-25 NOTE — Telephone Encounter (Signed)
Refilled via fax request from [facility].

## 2013-09-07 ENCOUNTER — Ambulatory Visit (INDEPENDENT_AMBULATORY_CARE_PROVIDER_SITE_OTHER): Payer: Medicare Other | Admitting: Family Medicine

## 2013-09-07 ENCOUNTER — Encounter: Payer: Self-pay | Admitting: Family Medicine

## 2013-09-07 VITALS — BP 131/76 | HR 99 | Temp 98.8°F | Ht 59.0 in | Wt 189.2 lb

## 2013-09-07 DIAGNOSIS — R300 Dysuria: Secondary | ICD-10-CM

## 2013-09-07 DIAGNOSIS — N390 Urinary tract infection, site not specified: Secondary | ICD-10-CM | POA: Insufficient documentation

## 2013-09-07 DIAGNOSIS — I10 Essential (primary) hypertension: Secondary | ICD-10-CM

## 2013-09-07 DIAGNOSIS — E119 Type 2 diabetes mellitus without complications: Secondary | ICD-10-CM

## 2013-09-07 LAB — POCT URINALYSIS DIPSTICK
Bilirubin, UA: NEGATIVE
Blood, UA: NEGATIVE
Glucose, UA: 250
Ketones, UA: NEGATIVE
Leukocytes, UA: NEGATIVE
Nitrite, UA: NEGATIVE
Protein, UA: NEGATIVE
Spec Grav, UA: 1.02
Urobilinogen, UA: 0.2
pH, UA: 7

## 2013-09-07 MED ORDER — GLIMEPIRIDE 4 MG PO TABS: 4.0000 mg | ORAL_TABLET | Freq: Every day | ORAL | Status: DC

## 2013-09-07 NOTE — Assessment & Plan Note (Signed)
Will switch back to more potent glymiperide which caused hypoglycemia previously.  I suspect her diabetes has progressed to the point where this will not be a problem.

## 2013-09-07 NOTE — Patient Instructions (Signed)
Please stop your glyburide. The new medicine, amaryl, is a cousin that you have taken before.  I doubt that it will be too strong for you now.  If you do start having low blood sugar spells, feel free to cut the dose in half. I will switch to a 90 day supply when you want I will call Monday with the test results.

## 2013-09-07 NOTE — Progress Notes (Signed)
   Subjective:    Patient ID: [name], female    DOB: 05-28-1941, 73 y.o.   MRN: [id number]  HPI Blood sugars remain high despite higher dose of glyburide.  Intollerant of metformin among others.  No new complaints.  Stress level high because of husband's cancer and chemo.    Review of Systems     Objective:   Physical Exam VS and wt noted Lungs clear  Cardiac RRR without m or g        Assessment & Plan:

## 2013-09-08 LAB — COMPLETE METABOLIC PANEL WITH GFR
ALT: 48 U/L — ABNORMAL HIGH (ref 0–35)
AST: 73 U/L — ABNORMAL HIGH (ref 0–37)
Albumin: 4.1 g/dL (ref 3.5–5.2)
Alkaline Phosphatase: 108 U/L (ref 39–117)
BUN: 18 mg/dL (ref 6–23)
CO2: 26 mEq/L (ref 19–32)
Calcium: 8.9 mg/dL (ref 8.4–10.5)
Chloride: 103 mEq/L (ref 96–112)
Creat: 1.04 mg/dL (ref 0.50–1.10)
GFR, Est African American: 62 mL/min
GFR, Est Non African American: 54 mL/min — ABNORMAL LOW
Glucose, Bld: 279 mg/dL — ABNORMAL HIGH (ref 70–99)
Potassium: 4 mEq/L (ref 3.5–5.3)
Sodium: 137 mEq/L (ref 135–145)
Total Bilirubin: 0.4 mg/dL (ref 0.2–1.2)
Total Protein: 6.7 g/dL (ref 6.0–8.3)

## 2013-09-08 LAB — CBC
HCT: 39.1 % (ref 36.0–46.0)
Hemoglobin: 12.8 g/dL (ref 12.0–15.0)
MCH: 28.6 pg (ref 26.0–34.0)
MCHC: 32.7 g/dL (ref 30.0–36.0)
MCV: 87.3 fL (ref 78.0–100.0)
Platelets: 181 10*3/uL (ref 150–400)
RBC: 4.48 MIL/uL (ref 3.87–5.11)
RDW: 14.8 % (ref 11.5–15.5)
WBC: 6.1 10*3/uL (ref 4.0–10.5)

## 2013-09-08 LAB — TSH: TSH: 1.587 u[IU]/mL (ref 0.350–4.500)

## 2013-09-12 ENCOUNTER — Encounter: Payer: Self-pay | Admitting: Family Medicine

## 2013-09-12 ENCOUNTER — Ambulatory Visit (HOSPITAL_COMMUNITY)
Admission: RE | Admit: 2013-09-12 | Discharge: 2013-09-12 | Disposition: A | Discharge status: Home / Self Care | Payer: Medicare Other | Source: Ambulatory Visit | Attending: Family Medicine | Admitting: Family Medicine

## 2013-09-12 ENCOUNTER — Telehealth: Payer: Self-pay | Admitting: Family Medicine

## 2013-09-12 ENCOUNTER — Ambulatory Visit (INDEPENDENT_AMBULATORY_CARE_PROVIDER_SITE_OTHER): Payer: Medicare Other | Admitting: Family Medicine

## 2013-09-12 VITALS — BP 144/85 | HR 98 | Temp 98.5°F | Ht 59.0 in | Wt 190.0 lb

## 2013-09-12 DIAGNOSIS — R079 Chest pain, unspecified: Secondary | ICD-10-CM

## 2013-09-12 DIAGNOSIS — R0789 Other chest pain: Secondary | ICD-10-CM | POA: Insufficient documentation

## 2013-09-12 LAB — TROPONIN I: Troponin I: <0.3 ng/mL (ref <0.30)

## 2013-09-12 NOTE — Patient Instructions (Addendum)
[name],  Thank you for coming in today. Your EKG and exam are reassuring. Please take  80 mg (4 tabs) of pepcid for the next 3 evenings. I will call you tomorrow.  If your troponin is elevated you will get a call tonight.  If you develop worsening pain especially with nausea, sweating or shortness of breath please call 911.   Dr. Adrian Blackwater

## 2013-09-12 NOTE — Assessment & Plan Note (Signed)
A: most likely GERD given history. Unchanged EKG 20 hrs post onset.  P: Increase PPI, trop I.  Instructions:  Thank you for coming in today. Your EKG and exam are reassuring. Please take  80 mg (4 tabs) of pepcid for the next 3 evenings. I will call you tomorrow.  If your troponin is elevated you will get a call tonight.  If you develop worsening pain especially with nausea, sweating or shortness of breath please call 911.

## 2013-09-12 NOTE — Telephone Encounter (Signed)
Called patient. Normal troponin. She is feeling fine.

## 2013-09-12 NOTE — Progress Notes (Signed)
   Subjective:    Patient ID: [name], female    DOB: [birth year], 73 y.o.   MRN: [id number]  HPI 73 yo F with hx significant for DM2 (dieth controlled), HTN, HLD, GERD presents for same day visit:  1. "chest heaviness": started around 9:30 last PM while at a super bowl party. Preceded by eating unusual foods for patient salsa, chips, dip, soda. Heaviness was substernal associated with feeling of food stuck in mid chest. Heaviness did not radiate. Symptoms did worsen after patient went home and took out the trash bins. She took nexium 40 mg x one, went to bed, woke up with worsening heaviness improved with sitting up. She was unable to sleep x 2 hrs. She feel asleep. This AM heaviness had improved and moved to R chest. Now very dull, R sided.   Pertinent negatives: nausea, vomiting, diaphoresis and shortness of breath.   Fam hx: reviewed  Soc Hx: ex-smoker  Review of Systems As per HPI     Objective:   Physical Exam Temp(Src) 98.5 F (36.9 C) (Oral)  Ht 4\' 11"  (1.499 m)  Wt 190 lb (86.183 kg)  BMI 38.35 kg/m2 General appearance: alert, cooperative and no distress Lungs: clear to auscultation bilaterally Heart: regular rate and rhythm, S1, S2 normal, no murmur, click, rub or gallop Chest: non tender. No rash or deformities.  Abdomen: soft, NABS, mild TTP epigastric and LLQ w/o rebound or guarding. no masses.    EKG: normal EKG, normal sinus rhythm, unchanged from previous tracings. Trop I: pending      Assessment & Plan:

## 2013-09-12 NOTE — Telephone Encounter (Signed)
Called pt and informed results of troponin (less than 0.30). Pt at this point reports to be asymptomatic and was appreciative of call.  Signed: D. Piloto Philippa Sicks, MD Family Medicine  PGY-3

## 2013-09-12 NOTE — Progress Notes (Signed)
Patient ID: [name], female   DOB: 07-21-1941, 73 y.o.   MRN: [id number] Called with lab results.  Says blood sugars running near 300.  Encouraged fluids and careful diet (not careful yesterday with Super Bowl Party.  Then she said she had chest heaviness last night with some lingering today.  Told she needs to be seen today - either our office or ER.  She expressed understanding.

## 2013-09-20 ENCOUNTER — Telehealth: Payer: Self-pay | Admitting: Family Medicine

## 2013-09-20 NOTE — Telephone Encounter (Signed)
Pt said she was told by Dr Andria Frames if she got sick to call She is coughing and her chest is heavy. Her mucus is green Please advise [phone]

## 2013-09-20 NOTE — Telephone Encounter (Signed)
Patient was schedule to be seen tomorrow for URI only ,patient schedule with Dr Andria Frames   next week for management of chronic issues. patient informed.[name], [name]

## 2013-09-21 ENCOUNTER — Encounter: Payer: Self-pay | Admitting: Family Medicine

## 2013-09-21 ENCOUNTER — Ambulatory Visit (INDEPENDENT_AMBULATORY_CARE_PROVIDER_SITE_OTHER): Payer: Medicare Other | Admitting: Family Medicine

## 2013-09-21 VITALS — BP 139/72 | HR 110 | Temp 98.1°F | Ht 59.0 in | Wt 188.1 lb

## 2013-09-21 DIAGNOSIS — N390 Urinary tract infection, site not specified: Secondary | ICD-10-CM | POA: Insufficient documentation

## 2013-09-21 DIAGNOSIS — R3911 Hesitancy of micturition: Secondary | ICD-10-CM

## 2013-09-21 DIAGNOSIS — R059 Cough, unspecified: Secondary | ICD-10-CM

## 2013-09-21 DIAGNOSIS — R05 Cough: Secondary | ICD-10-CM

## 2013-09-21 LAB — POCT URINALYSIS DIPSTICK
Bilirubin, UA: NEGATIVE
Blood, UA: NEGATIVE
Glucose, UA: NEGATIVE
Ketones, UA: NEGATIVE
Nitrite, UA: NEGATIVE
Protein, UA: NEGATIVE
Spec Grav, UA: 1.02
Urobilinogen, UA: 0.2
pH, UA: 6

## 2013-09-21 LAB — POCT UA - MICROSCOPIC ONLY

## 2013-09-21 MED ORDER — LEVOFLOXACIN 500 MG PO TABS: 500.0000 mg | ORAL_TABLET | Freq: Every day | ORAL | Status: DC

## 2013-09-21 MED ORDER — BENZONATATE 100 MG PO CAPS: 100.0000 mg | ORAL_CAPSULE | Freq: Two times a day (BID) | ORAL | Status: DC | PRN

## 2013-09-21 NOTE — Patient Instructions (Signed)
You clearly have a chest infection.  Maybe viral but after 8 days, it is time to try an antibiotic. You also have a urinary/bladder infection.   The one antibiotic, levofloxacin, should treat both. Do not take your vitamin or magnesium while you are taking the levofloxacin.  Vit C is OK.

## 2013-09-22 NOTE — Assessment & Plan Note (Signed)
Levaquin should cover both resp illness and UTI.

## 2013-09-22 NOTE — Progress Notes (Signed)
   Subjective:    Patient ID: [name], female    DOB: 04-06-1941, 73 y.o.   MRN: [id number]  HPI  Chest cold with cough, wheezing and subjective fever for 8 days.  No clear signs of improvement yet.  Stakes are higher than normal since husband starts chemo next week for lymphoma.  Had stress incontinence with repetitive coughing.  Now with dysuria and hesitancy. No nausea or flank pain.    Review of Systems     Objective:   Physical Exam Gen non toxic Lungs can't take a deep breath without cough.  Diffuse exp wheeze No CVA tenderness Abd benign.       Assessment & Plan:

## 2013-09-22 NOTE — Assessment & Plan Note (Signed)
While still could be viral, now qualifies for antibiotic treatment, esp given husband to start chemo Levaquin should cover both resp illness and UTI.

## 2013-09-28 ENCOUNTER — Encounter: Payer: Self-pay | Admitting: Family Medicine

## 2013-09-28 ENCOUNTER — Ambulatory Visit (INDEPENDENT_AMBULATORY_CARE_PROVIDER_SITE_OTHER): Payer: Medicare Other | Admitting: Family Medicine

## 2013-09-28 VITALS — BP 140/70 | HR 87 | Temp 98.0°F | Ht 59.0 in | Wt 188.2 lb

## 2013-09-28 DIAGNOSIS — E119 Type 2 diabetes mellitus without complications: Secondary | ICD-10-CM

## 2013-09-28 MED ORDER — LINAGLIPTIN 5 MG PO TABS: 5.0000 mg | ORAL_TABLET | Freq: Every day | ORAL | Status: DC

## 2013-09-28 NOTE — Patient Instructions (Addendum)
START Trajenta 5mg  daily - come back to see Pharmacist in early March.  Continue the glimiperide for now.  You know what symptoms to watch for for hypoglycemia. Make sure I get the report from you eye doctor.

## 2013-09-29 ENCOUNTER — Telehealth: Payer: Self-pay | Admitting: Family Medicine

## 2013-09-29 NOTE — Telephone Encounter (Signed)
Pt was seen yesterday 2/18 and prescribed Tradjenta. She said that this is tier 4 and the co-pay is 80.00. She needs a medication that is in tier 3 which is a 40.00 co-pay. Please call in something else. jw

## 2013-09-29 NOTE — Assessment & Plan Note (Signed)
In collaboration with Valentina Lucks, PharmD, begin [place].

## 2013-09-29 NOTE — Progress Notes (Signed)
   Subjective:    Patient ID: [name], female    DOB: 01-27-1941, 73 y.o.   MRN: [id number]  HPI Patient has recovered nicely from her acute illness of last week - see note of 09/21/13.  Now follow up of DM.  Control has slipped away.  Cannot take metformin.  Years ago, was sensitive to hypoglycemia of sulfonylureas.  Now, no hypoglycemia but not controling.  Januvia is one of her long list of allergies.    Review of Systems     Objective:   Physical Exam Lungs clear Abd benign.       Assessment & Plan:

## 2013-09-30 NOTE — Telephone Encounter (Signed)
Will forward to Dr. Valentina Lucks

## 2013-10-03 ENCOUNTER — Telehealth: Payer: Self-pay | Admitting: Family Medicine

## 2013-10-03 MED ORDER — FLUCONAZOLE 150 MG PO TABS: 150.0000 mg | ORAL_TABLET | Freq: Once | ORAL | Status: DC

## 2013-10-03 NOTE — Telephone Encounter (Signed)
Spoke with patient informed her that I sent in diflucan

## 2013-10-03 NOTE — Telephone Encounter (Signed)
Patient called again and she said that we can get her a list of other medications she can call her insurance company and find out which are cheaper and call us back

## 2013-10-03 NOTE — Telephone Encounter (Signed)
Pt called and now needs a medication for a yeast infection called in. jw

## 2013-10-05 ENCOUNTER — Other Ambulatory Visit: Payer: Self-pay | Admitting: Family Medicine

## 2013-10-05 ENCOUNTER — Telehealth: Payer: Self-pay | Admitting: Family Medicine

## 2013-10-05 DIAGNOSIS — E119 Type 2 diabetes mellitus without complications: Secondary | ICD-10-CM

## 2013-10-05 MED ORDER — SAXAGLIPTIN HCL 5 MG PO TABS: 5.0000 mg | ORAL_TABLET | Freq: Every day | ORAL | Status: DC

## 2013-10-05 NOTE — Telephone Encounter (Signed)
Discussed options for alternative to Trajenta.   Patient was informed that the copay was lower for [facility].  We agreed this agent would be a cheaper option with similar expected benefits.   She will try to pick this medication up AND get started in the next day or two.   Reviewed use and potential side effects.   Once daily ONGLYZA 5mg  sent to her pharmacy - 1 refill.

## 2013-10-05 NOTE — Telephone Encounter (Signed)
Pt called for a few reasons. One is she needs a refill on her Diflucan, and the second is Dr. Valentina Lucks still has not returned her call concerning the new medication that he has her on because the co-pay is 80.00 and she can not afford this. The third issue is that she only has 2 pills for her diabetes left but she doesn't feel that it is helping because yesterday her blood sugar was 248 and today it was 204. Please call and advise on her on what she should do. jw

## 2013-10-05 NOTE — Telephone Encounter (Signed)
[name] called to ask about ? Medication prescribed for her diabetes that will have a cheaper cost to obtain.  Her copay with her insurance for the new medication you prescribed is 80.00.Marland Kitchen She has left several msgs for you and need to know something asap because she is running out of the present med Dr. Andria Frames gave her.  Please contact asap

## 2013-10-05 NOTE — Telephone Encounter (Signed)
Please advise. [name]  

## 2013-10-11 ENCOUNTER — Encounter: Payer: Self-pay | Admitting: Pharmacist

## 2013-10-11 ENCOUNTER — Ambulatory Visit (INDEPENDENT_AMBULATORY_CARE_PROVIDER_SITE_OTHER): Payer: Medicare Other | Admitting: Pharmacist

## 2013-10-11 VITALS — BP 131/81 | HR 97 | Ht 59.0 in | Wt 188.8 lb

## 2013-10-11 DIAGNOSIS — E119 Type 2 diabetes mellitus without complications: Secondary | ICD-10-CM

## 2013-10-11 LAB — POCT GLYCOSYLATED HEMOGLOBIN (HGB A1C): Hemoglobin A1C: 9.9

## 2013-10-11 MED ORDER — FREESTYLE LANCETS MISC: Status: DC

## 2013-10-11 MED ORDER — GLUCOSE BLOOD VI STRP: ORAL_STRIP | Status: DC

## 2013-10-11 MED ORDER — ONETOUCH ULTRA 2 W/DEVICE KIT: PACK | Status: DC

## 2013-10-11 MED ORDER — INSULIN PEN NEEDLE 32G X 6 MM MISC: 10.0000 [IU] | Freq: Once | Status: DC

## 2013-10-11 MED ORDER — INSULIN GLARGINE 100 UNIT/ML SOLOSTAR PEN: 10.0000 [IU] | PEN_INJECTOR | Freq: Every morning | SUBCUTANEOUS | Status: DC

## 2013-10-11 NOTE — Progress Notes (Signed)
Patient ID: [name], female   DOB: 21-Feb-1941, 73 y.o.   MRN: [id number] Reviewed: Agree with Dr. Graylin Shiver documentation and management.

## 2013-10-11 NOTE — Assessment & Plan Note (Signed)
Diabetes diagnosed in 2005 currently uncontrolled.   Denies hypoglycemic events and is able to verbalize appropriate hypoglycemia management plan.  Reports adherence with medication.   Control is suboptimal due to stress and longstanding diabetes. Started basal insulin Lantus (insulin glargine) 10 units in the morning. Patient will continue to titrate 1 unit/day if fasting CBGs > 150mg /dl until fasting CBGs reach goal or next visit. Patient will begin taking blood glucose twice daily. One fasting reading one at varying times of the day. Counseling on proper injection technique provided.   Written patient instructions provided.  Follow up in Pharmacist Clinic Visit 3 weeks.   Total time in face to face counseling 60 minutes.  Patient seen with Terrilyn Saver, PharmD Resident and Wilfred Curtis, PharmD Resident.Marland Kitchen

## 2013-10-11 NOTE — Progress Notes (Signed)
S:    Patient arrives in good spirits. Presents for diabetes management. Patient reports having history of Diabetes since the year of 2005. Patient has taken oral medications since diagnosis.    O:  . Lab Results  Component Value Date   HGBA1C 9.9 10/11/2013     home fasting CBG readings of 180-240.    A/P: Diabetes diagnosed in 2005 currently uncontrolled.   Denies hypoglycemic events and is able to verbalize appropriate hypoglycemia management plan.  Reports adherence with medication.   Control is suboptimal due to stress and longstanding diabetes. Started basal insulin Lantus (insulin glargine) 10 units in the morning. Patient will continue to titrate 1 unit/day if fasting CBGs > 150mg /dl until fasting CBGs reach goal or next visit. Patient will begin taking blood glucose twice daily. One fasting reading one at varying times of the day. Counseling on proper injection technique provided.   Written patient instructions provided.  Follow up in Pharmacist Clinic Visit 3 weeks.   Total time in face to face counseling 60 minutes.  Patient seen with Terrilyn Saver, PharmD Resident and Wilfred Curtis, PharmD Resident.Marland Kitchen

## 2013-10-11 NOTE — Patient Instructions (Signed)
Thank you for coming in today!  We started Lantus today. Please start Lantus 10 units every morning. Increase by 1 unit for each day your morning reading is over 150.  Start taking your blood sugar twice a day. Once in the morning before eating every day. Also start taking your blood sugar before or after a different meal ever day. Keep your blood sugar log and bring it to your next appointment.   Please make an appointment with the pharmacy clinic in 3 weeks.

## 2013-10-17 ENCOUNTER — Telehealth: Payer: Self-pay | Admitting: Pharmacist

## 2013-10-17 ENCOUNTER — Encounter: Payer: Self-pay | Admitting: Pharmacist

## 2013-10-17 ENCOUNTER — Telehealth: Payer: Self-pay | Admitting: Family Medicine

## 2013-10-17 MED ORDER — ONETOUCH ULTRA 2 W/DEVICE KIT: PACK | Status: DC

## 2013-10-17 MED ORDER — ONETOUCH ULTRA 2 W/DEVICE KIT: 1.0000 | PACK | Freq: Once | Status: DC

## 2013-10-17 MED ORDER — ONETOUCH SURESOFT LANCING DEV MISC: 1.0000 | Freq: Four times a day (QID) | Status: DC

## 2013-10-17 MED ORDER — ONETOUCH SURESOFT LANCING DEV MISC: 1.0000 | Status: DC | PRN

## 2013-10-17 NOTE — Telephone Encounter (Signed)
Called [facility] pharmacy and verified QS for testing of lancets AND meter.  Resent e-rxs

## 2013-10-17 NOTE — Telephone Encounter (Signed)
Pharmacy called because they can not fill Chennel's prescriptions until they have the diagnoses codes for her Lancets, and the meter. They said that they have been faxing [place] the papers to fill out. They also need to know the directions on how Binta is going to use these. jw

## 2013-10-17 NOTE — Telephone Encounter (Signed)
Dr Andria Frames called in medication.[name], [name]

## 2013-10-25 ENCOUNTER — Telehealth: Payer: Self-pay | Admitting: Family Medicine

## 2013-10-25 NOTE — Telephone Encounter (Signed)
Patient was advised to schedule an appointment,she voiced understanding.Sebastian Dzik, Lewie Loron

## 2013-10-25 NOTE — Telephone Encounter (Signed)
Has all the symptons of UTI. Can she just come in for a urine test or does she need to see a dr? Please advise

## 2013-10-26 ENCOUNTER — Encounter: Payer: Self-pay | Admitting: Family Medicine

## 2013-10-26 ENCOUNTER — Ambulatory Visit (INDEPENDENT_AMBULATORY_CARE_PROVIDER_SITE_OTHER): Payer: Medicare Other | Admitting: Family Medicine

## 2013-10-26 VITALS — BP 128/71 | HR 99 | Temp 98.1°F | Ht 59.0 in | Wt 190.2 lb

## 2013-10-26 DIAGNOSIS — R300 Dysuria: Secondary | ICD-10-CM

## 2013-10-26 DIAGNOSIS — N390 Urinary tract infection, site not specified: Secondary | ICD-10-CM

## 2013-10-26 LAB — POCT URINALYSIS DIPSTICK
Bilirubin, UA: NEGATIVE
Glucose, UA: 100
Ketones, UA: NEGATIVE
Nitrite, UA: NEGATIVE
Protein, UA: 100
Spec Grav, UA: 1.02
Urobilinogen, UA: 0.2
pH, UA: 6.5

## 2013-10-26 LAB — POCT UA - MICROSCOPIC ONLY

## 2013-10-26 MED ORDER — CEPHALEXIN 500 MG PO CAPS: 500.0000 mg | ORAL_CAPSULE | Freq: Three times a day (TID) | ORAL | Status: DC

## 2013-10-26 NOTE — Patient Instructions (Signed)
I trust this is a simple urinary track infection. Call me if you need the diflucan again. Keep your appointment with Dr. Valentina Lucks. Tell Casimer Bilis I said hi and good luck.

## 2013-10-26 NOTE — Assessment & Plan Note (Signed)
Uncomplicated UTI, rx with keflex

## 2013-10-26 NOTE — Progress Notes (Signed)
   Subjective:    Patient ID: [name], female    DOB: [birth year], 73 y.o.   MRN: [id number]  HPI Doing well from DM standpoint.  FBS now 120ish on 11 units of insulin daily.  Has much more energy.  24 hours of urgency, frequency and lower abd pain similar to previous UTI.  No fever, nausea or flank pain.  Has chronic combo stress urge incontinence.  Nothing new    Review of Systems     Objective:   Physical Exam  Abd benign NoCVA tendernes        Assessment & Plan:

## 2013-11-01 ENCOUNTER — Ambulatory Visit (INDEPENDENT_AMBULATORY_CARE_PROVIDER_SITE_OTHER): Payer: Medicare Other | Admitting: Pharmacist

## 2013-11-01 ENCOUNTER — Encounter: Payer: Self-pay | Admitting: Pharmacist

## 2013-11-01 VITALS — BP 122/68 | HR 84 | Ht 59.5 in | Wt 190.5 lb

## 2013-11-01 DIAGNOSIS — E119 Type 2 diabetes mellitus without complications: Secondary | ICD-10-CM

## 2013-11-01 NOTE — Assessment & Plan Note (Signed)
Diabetes diagnosed in 2005 currently uncontrolled but improved since initiation of Lantus. Reports one hypoglycemic event and is able to verbalize appropriate hypoglycemia management plan.  Reports adherence with medication. Control is suboptimal due to stress and long standing diabetes.  Adjusted dose of basal insulin Lantus (insulin glargine) to 12 units daily. Patient was concerned about the cost of her Onglyza so will discontinue at this time but continue with glimepiride. Will begin BG monitoring after one meal per day and continue with fasting BG checks. She was counseled to call the clinic if she is having consistent BG readings <80. Concerns with weight gain were addressed and patient was counseled on eating smaller meals throughout the day especially evening meals. We congratulated patient on improvement in BG readings and encouraged her continued success.   Written patient instructions provided.  Follow up with Dr. Andria Frames in 1 month.  Total time in face to face counseling 60 minutes.  Patient seen with Epimenio Sarin, PharmD Candidate, Terrilyn Saver, PharmD Resident, and Wilfred Curtis, PharmD Resident.

## 2013-11-01 NOTE — Progress Notes (Signed)
S:    Patient arrives in great spirits with her glucose log as well as her new glucose meter. Presents for diabetes management. Patient reports having history of Diabetes since the year of 2005.  Patient has taken oral medication since diagnosis and Lantus since last office visit on 10/11/13. Has been mainly using 11 units of Lantus, but used 12 units yesterday resulting in a fasting lab this am of 111.  Reports only having one episode of perceived hypoglycemia for which she ate some life saver candies and soda. She states that she and her husband have noticed a marked improvement in her energy level but she is having some concerns about gaining weight since starting Lantus.     O:  Lab Results  Component Value Date   HGBA1C 9.9 10/11/2013     Home fasting CBG readings of 111-165 (14 day average: 137) 2-hour post-prandial readings: after lunch 172, bedtime 301  Diet: Breakfast: Eggs, sausage, bacon, hash browns, muffins Lunch: Cheese on pita, lemon water Dinner: Fish or lamb chops or pasta with vegetables  Exercise: Walking with shopping, yard work  A/P: Diabetes diagnosed in 2005 currently uncontrolled but improved since initiation of Lantus. Reports one hypoglycemic event and is able to verbalize appropriate hypoglycemia management plan.  Reports adherence with medication. Control is suboptimal due to stress and long standing diabetes.  Adjusted dose of basal insulin Lantus (insulin glargine) to 12 units daily. Patient was concerned about the cost of her Onglyza so will discontinue at this time but continue with glimepiride. Will begin BG monitoring after one meal per day and continue with fasting BG checks. She was counseled to call the clinic if she is having consistent BG readings <80. Concerns with weight gain were addressed and patient was counseled on eating smaller meals throughout the day especially evening meals. We congratulated patient on improvement in BG readings and encouraged her  continued success.   Written patient instructions provided.  Follow up with Dr. Andria Frames in 1 month.  Total time in face to face counseling 60 minutes.  Patient seen with Epimenio Sarin, PharmD Candidate, Terrilyn Saver, PharmD Resident, and Wilfred Curtis, PharmD Resident.

## 2013-11-01 NOTE — Patient Instructions (Signed)
Thanks for coming to see [name] today!  You can discontinue your Onglyza and continue with 12 units of Lantus every morning.  Please start checking your blood sugar after a meal once a day and continue to check your blood sugar every morning.  Try not to eat very large meals in the evening.   Make an appointment to see Dr. Andria Frames in 1 month. If you start seeing blood sugars <80 consistently, please call [name] sooner.

## 2013-11-02 ENCOUNTER — Other Ambulatory Visit: Payer: Self-pay | Admitting: Family Medicine

## 2013-11-02 MED ORDER — NORTRIPTYLINE HCL 50 MG PO CAPS: 50.0000 mg | ORAL_CAPSULE | Freq: Every day | ORAL | Status: DC

## 2013-11-02 NOTE — Telephone Encounter (Signed)
Refilled via fax in response to fax request.

## 2013-11-04 ENCOUNTER — Telehealth: Payer: Self-pay | Admitting: Family Medicine

## 2013-11-04 MED ORDER — FLUCONAZOLE 150 MG PO TABS: ORAL_TABLET | ORAL | Status: DC

## 2013-11-04 NOTE — Telephone Encounter (Signed)
Left message on patient's voicemail.[name], [name]

## 2013-11-04 NOTE — Telephone Encounter (Signed)
Please advise.Thank you.Aprill Banko S  

## 2013-11-04 NOTE — Telephone Encounter (Signed)
Pt called to let Dr. Andria Frames know that she now needs the medication for a yeast infection called in. jw

## 2013-11-23 ENCOUNTER — Ambulatory Visit (INDEPENDENT_AMBULATORY_CARE_PROVIDER_SITE_OTHER): Payer: Medicare Other | Admitting: Family Medicine

## 2013-11-23 ENCOUNTER — Encounter: Payer: Self-pay | Admitting: Family Medicine

## 2013-11-23 VITALS — BP 157/92 | HR 94 | Temp 97.5°F | Ht 59.0 in | Wt 191.0 lb

## 2013-11-23 DIAGNOSIS — J189 Pneumonia, unspecified organism: Secondary | ICD-10-CM

## 2013-11-23 MED ORDER — CEPHALEXIN 500 MG PO CAPS: 500.0000 mg | ORAL_CAPSULE | Freq: Four times a day (QID) | ORAL | Status: DC

## 2013-11-23 NOTE — Progress Notes (Signed)
   Subjective:    Patient ID: [name], female    DOB: May 12, 1941, 73 y.o.   MRN: [id number]  HPI: Pt presents to clinic for cough and ear pain for several days, worse for two days. Pt also has congestion, ear pain on the right, sore throat worse on the right, and increased chest congestion as well. Cough is productive of green and yellow mucous. For the past two days, pt has had more chest congestion and thicker, fouler-smelling mucous. She also has some pain with cough and speaking. She denies any blood / clots / flecks in her sputum. She had a cold-like syndrome about 8 weeks and has a history of asthma. She has been taking her regular prescription medicine and Tessalon perles 100 mg and "diabetic cough medicine" but nothing has helped other than cough drops.   Of note, pt's husband has recently started chemotherapy, and she is very concerned about being ill around him.  Review of Systems: As above. Pt also endorses feeling "very cold" and being very tired, but denies fevers. She denies N/V, change in bowel habits, or skin rashes. She does endorse some stress incontinence with the cough. She has no known sick contacts; she has been "very careful" about anybody with colds / illnesses / etc due to her husband's condition, as above.     Objective:   Physical Exam BP 157/92  Pulse 94  Temp(Src) 97.5 F (36.4 C) (Oral)  Ht 4\' 11"  (1.499 m)  Wt 191 lb (86.637 kg)  BMI 38.56 kg/m2  SpO2 94% Gen: nontoxic but uncomfortable appearing elderly female, coughing often during exam HEENT: right TM with mild amount of fluid but not red / bulging / distorted, left TM normal  [place]/AT, PERRLA, EOMI, MMM, otherwise; nasal mucosae moderately inflamed  Posterior oropharyngeal mucosa red, mildly edematous  Right tonsil enlarged slightly more than left with small amount of exudate Cardio: RRR, no murmur appreciated Pulm: normal WOB but significant cough with deep breathing for exam  Right lung fields  coarser than left, slightly diminished breath sounds at right base   Generally good air movement bilaterally and no distress on room air Ext: warm, well-perfused Skin: no rash     Assessment & Plan:  A: 74yo female with likely CAP. Appears current illness may have started as a viral URI (esp given HEENT findings), now with superimposed bacterial infection.  Generally nontoxic appearance without O2 requirement, so strongly doubt need for admission.  Pt is allergic to numerous abx, but has taken Keflex in the past without difficulty. Also considered Cipro.  P: Rx for Keflex 500 mg QID for 10 days. F/u in 5 days (Monday 4/20) with me. Continue supportive care (Tessalon, OTC meds) for cough, otherwise; considered Hycodan but pt intolerant to narcotics. Provided specific list of red flags and reviewed in depth reasons to return to clinic or present to the ED. Also briefly discussed pt with PCP Dr. Andria Frames. Plan for f/u in May with him, regardless.  Emmaline Kluver, MD PGY-2, [place]

## 2013-11-23 NOTE — Patient Instructions (Signed)
Front desk: Please schedule Mrs. Buikema with me on Monday 4/20. It is okay to double-book me in the afternoon with her. --CMS  Thank you for coming in, today!  I think this is a pneumonia. I want you to take Keflex (cephalexin) 500 mg four times a day for ten days. Come back to see me on Monday to get checked again.  If you have ANY of the following, come back sooner or go to the emergency room. - fever, vomiting - worse pain with breathing or talking - coughing up blood or clots or red sputum - worse SOB or trouble breathing - any chest pain or tightness  Please feel free to call with any questions or concerns at any time, at [phone]. --Dr. Venetia Maxon

## 2013-11-28 ENCOUNTER — Ambulatory Visit (INDEPENDENT_AMBULATORY_CARE_PROVIDER_SITE_OTHER): Payer: Medicare Other | Admitting: Family Medicine

## 2013-11-28 ENCOUNTER — Telehealth: Payer: Self-pay | Admitting: *Deleted

## 2013-11-28 ENCOUNTER — Encounter: Payer: Self-pay | Admitting: Family Medicine

## 2013-11-28 VITALS — BP 135/83 | HR 94 | Temp 98.1°F | Ht 59.0 in | Wt 187.7 lb

## 2013-11-28 DIAGNOSIS — B373 Candidiasis of vulva and vagina: Secondary | ICD-10-CM

## 2013-11-28 DIAGNOSIS — J189 Pneumonia, unspecified organism: Secondary | ICD-10-CM

## 2013-11-28 DIAGNOSIS — B3731 Acute candidiasis of vulva and vagina: Secondary | ICD-10-CM

## 2013-11-28 MED ORDER — BENZONATATE 200 MG PO CAPS: 200.0000 mg | ORAL_CAPSULE | Freq: Two times a day (BID) | ORAL | Status: DC | PRN

## 2013-11-28 MED ORDER — FLUCONAZOLE 150 MG PO TABS: ORAL_TABLET | ORAL | Status: DC

## 2013-11-28 NOTE — Patient Instructions (Signed)
Thank you for coming in, today!  I think your pneumonia is getting better. Make sure you finish your antibiotic. I will give you a prescription for yeast (Diflucan or fluconazole). You can take 1 pill today and 1 tomorrow, then repeat that in one week if needed. I will give you a prescription for the stronger Tessalon to see if that helps.  I think your chest pain is from the cough. If that gets any worse or changes, call and let us know or go to the emergency room.  Otherwise, follow up with Dr. Andria Frames in 1 week, or call sooner if you need. Please feel free to call with any questions or concerns at any time, at [phone]. --Dr. Venetia Maxon

## 2013-11-28 NOTE — Telephone Encounter (Signed)
Received message from Rx line from [facility] that the Tessalon 200 mg caps are on manufacturer's backorder.  Please call another Rx for pt's cough [phone].  Derl Barrow, RN

## 2013-11-28 NOTE — Progress Notes (Signed)
   Subjective:    Patient ID: [name], female    DOB: 15-Oct-1940, 73 y.o.   MRN: [id number]  HPI: Pt presents to clinic for follow-up of pneumonia; diagnosed last week in clinic. Overall she feels that she is improving, but still does have some greenish sputum production and a severe cough. She has not had any blood in her sputum. She did get more hoarse between last visit and today, but overall is better. She slept much better last night, and feels better today because of it. Nothing she takes seems to have helped the cough (OTC meds, diabetic cough syrup with dextromethorphan, cough drops, as well as Tessalon perles) other than a mild help with Tessalon (currently on 100 mg; used 200 mg in the past with more help). Her breathing is improved, but she does have some reproducible, right-sided chest pain that she attributes to the cough.  Of note, pt describes some burning, itching from her rectum to her vagina, which is very similar to her previous yeast infections that she has gotten with antibiotics.  Review of Systems: As above.     Objective:   Physical Exam BP 135/83  Pulse 94  Temp(Src) 98.1 F (36.7 C) (Oral)  Ht 4\' 11"  (1.499 m)  Wt 187 lb 11.2 oz (85.14 kg)  BMI 37.89 kg/m2 Gen: elderly female in NAD, appears more comfortable and with much left cough than last visit HEENT: TM's appear normal bilaterally; [place]/AT, PERRLA, EOMI, MMM, otherwise  nasal mucosae remain slightly inflamed but without frank drainage  Posterior oropharyngeal mucosa much less red / edematous   No tonsillar enlargement or exudate noted Cardio: RRR, no murmur appreciated  Pulm: normal WOB but still with some cough with deep breathing for exam   Right-sided breath sounds improved, now with bilateral good air movement and clear sounds Ext: warm, well-perfused  GU: not examined per pt preference     Assessment & Plan:  A: 73yo female with likely CAP, improving with Keflex. Other symptoms suggestive of  vulvovaginal candidiasis related to abx.  P: Continue Keflex (5 days remaining), then f/u with PCP as previously scheduled. Rx for Diflucan 150 mg daily for 2 days, repeat in 1 week if needed. Continue supportive care (Tessalon, OTC meds) for cough, otherwise. Provided Rx for Tessalon 200 mg (previously had 100 mg Rx), but on backorder. Pt to be instructed she can take 2 100 mg Tessalon perles at once, in the meantime. Reiterated red flags and reviewed in depth reasons to return to clinic or present to the ED.   Emmaline Kluver, MD  PGY-2, [place]

## 2013-11-28 NOTE — Telephone Encounter (Signed)
There's not another good option for her. She should have 100 mg Tessalon perles left from what she told me. She can take 2 at a time until the pharmacy has them available. She can also continue the over-the-counter stuff, and/or she can try tea with lemon and honey. Thanks!  --CMS

## 2013-11-28 NOTE — Telephone Encounter (Signed)
Per pt, the pharmacy located the 200 mg Tessalon caps for her.  Pt also informed of message from Dr. Venetia Maxon.  See note below.  Derl Barrow, RN

## 2013-12-02 ENCOUNTER — Other Ambulatory Visit: Payer: Self-pay

## 2013-12-02 DIAGNOSIS — Z1231 Encounter for screening mammogram for malignant neoplasm of breast: Secondary | ICD-10-CM

## 2013-12-07 ENCOUNTER — Ambulatory Visit (INDEPENDENT_AMBULATORY_CARE_PROVIDER_SITE_OTHER): Payer: Medicare Other | Admitting: Family Medicine

## 2013-12-07 ENCOUNTER — Encounter: Payer: Self-pay | Admitting: Family Medicine

## 2013-12-07 VITALS — BP 112/67 | HR 98 | Temp 98.3°F | Ht 59.0 in | Wt 190.0 lb

## 2013-12-07 DIAGNOSIS — E119 Type 2 diabetes mellitus without complications: Secondary | ICD-10-CM

## 2013-12-07 NOTE — Patient Instructions (Signed)
I am glad you are feeling better from the pneumonia. I hope Sid keeps his head during these treatments. I want you to check your blood sugar every morning.  If above 130, increase your insulin dose by one unit each day.   See me in one month to  Recheck diabetes.

## 2013-12-08 NOTE — Progress Notes (Signed)
   Subjective:    Patient ID: [name], female    DOB: 02/22/1941, 73 y.o.   MRN: [id number]  HPI  Recovering from pneumonia/lower respiratory track infection.  Main issue is high blood sugars.  She initially (2 months ago did very well on lantus 12 with good FBS control.)  Even before the pneumonia, BS started up.  Now quite high.  Other than polyuria, asymptomatic.       Review of Systems     Objective:   Physical Exam Lungs clear.  VS noted       Assessment & Plan:

## 2013-12-08 NOTE — Assessment & Plan Note (Signed)
Will gradually increase daily lantus - see instructions.  Also, long term will get off amaryl.  Will continue for now due to hyperglycemia.

## 2013-12-13 ENCOUNTER — Ambulatory Visit
Admission: RE | Admit: 2013-12-13 | Discharge: 2013-12-13 | Disposition: A | Discharge status: Home / Self Care | Payer: Medicare Other | Source: Ambulatory Visit

## 2013-12-13 DIAGNOSIS — Z1231 Encounter for screening mammogram for malignant neoplasm of breast: Secondary | ICD-10-CM

## 2013-12-15 ENCOUNTER — Other Ambulatory Visit: Payer: Self-pay | Admitting: Family Medicine

## 2013-12-15 DIAGNOSIS — R928 Other abnormal and inconclusive findings on diagnostic imaging of breast: Secondary | ICD-10-CM

## 2013-12-16 ENCOUNTER — Other Ambulatory Visit: Payer: Self-pay | Admitting: Family Medicine

## 2013-12-16 ENCOUNTER — Other Ambulatory Visit: Payer: Self-pay

## 2013-12-16 DIAGNOSIS — R928 Other abnormal and inconclusive findings on diagnostic imaging of breast: Secondary | ICD-10-CM

## 2013-12-20 ENCOUNTER — Ambulatory Visit
Admission: RE | Admit: 2013-12-20 | Discharge: 2013-12-20 | Disposition: A | Discharge status: Home / Self Care | Payer: Medicare Other | Source: Ambulatory Visit | Attending: Family Medicine | Admitting: Family Medicine

## 2013-12-20 ENCOUNTER — Other Ambulatory Visit: Payer: Self-pay | Admitting: Family Medicine

## 2013-12-20 DIAGNOSIS — R928 Other abnormal and inconclusive findings on diagnostic imaging of breast: Secondary | ICD-10-CM

## 2013-12-20 DIAGNOSIS — N6002 Solitary cyst of left breast: Secondary | ICD-10-CM

## 2013-12-20 HISTORY — PX: BREAST CYST ASPIRATION: SHX578

## 2013-12-23 ENCOUNTER — Telehealth: Payer: Self-pay | Admitting: Family Medicine

## 2013-12-23 NOTE — Telephone Encounter (Signed)
Patient advised to schedule an appointment with Dr Rande Lawman voiced understanding.[place]

## 2013-12-23 NOTE — Telephone Encounter (Signed)
Pt called because she in on 4/29 and was told to increase her Lantus by one unit to try and get her blood sugar down. She is up to 28 units a day and her blood sugar is still high the lowest she has gotten it too was 165, Today it is 211 and most days for the past 2 1/2 weeks it stay between 187-197. She isn't sure what else she should do. Please call and advise on what to do next. jw

## 2013-12-26 ENCOUNTER — Other Ambulatory Visit: Payer: Self-pay

## 2013-12-28 ENCOUNTER — Ambulatory Visit: Payer: Medicare Other | Admitting: Family Medicine

## 2013-12-30 ENCOUNTER — Encounter: Payer: Self-pay | Admitting: Family Medicine

## 2013-12-30 ENCOUNTER — Ambulatory Visit (INDEPENDENT_AMBULATORY_CARE_PROVIDER_SITE_OTHER): Payer: Medicare Other | Admitting: Family Medicine

## 2013-12-30 VITALS — BP 118/62 | HR 82 | Temp 98.2°F | Ht 59.0 in | Wt 191.0 lb

## 2013-12-30 DIAGNOSIS — M479 Spondylosis, unspecified: Secondary | ICD-10-CM

## 2013-12-30 DIAGNOSIS — I10 Essential (primary) hypertension: Secondary | ICD-10-CM

## 2013-12-30 DIAGNOSIS — E119 Type 2 diabetes mellitus without complications: Secondary | ICD-10-CM

## 2013-12-30 LAB — COMPLETE METABOLIC PANEL WITH GFR
ALT: 46 U/L — ABNORMAL HIGH (ref 0–35)
AST: 66 U/L — ABNORMAL HIGH (ref 0–37)
Albumin: 4 g/dL (ref 3.5–5.2)
Alkaline Phosphatase: 98 U/L (ref 39–117)
BUN: 19 mg/dL (ref 6–23)
CO2: 26 mEq/L (ref 19–32)
Calcium: 8.8 mg/dL (ref 8.4–10.5)
Chloride: 105 mEq/L (ref 96–112)
Creat: 0.91 mg/dL (ref 0.50–1.10)
GFR, Est African American: 73 mL/min
GFR, Est Non African American: 64 mL/min
Glucose, Bld: 330 mg/dL — ABNORMAL HIGH (ref 70–99)
Potassium: 4.2 mEq/L (ref 3.5–5.3)
Sodium: 139 mEq/L (ref 135–145)
Total Bilirubin: 0.4 mg/dL (ref 0.2–1.2)
Total Protein: 6.2 g/dL (ref 6.0–8.3)

## 2013-12-30 LAB — CBC
HCT: 37.6 % (ref 36.0–46.0)
Hemoglobin: 12.4 g/dL (ref 12.0–15.0)
MCH: 27.9 pg (ref 26.0–34.0)
MCHC: 33 g/dL (ref 30.0–36.0)
MCV: 84.7 fL (ref 78.0–100.0)
Platelets: 142 10*3/uL — ABNORMAL LOW (ref 150–400)
RBC: 4.44 MIL/uL (ref 3.87–5.11)
RDW: 15.4 % (ref 11.5–15.5)
WBC: 5.4 10*3/uL (ref 4.0–10.5)

## 2013-12-30 LAB — POCT GLYCOSYLATED HEMOGLOBIN (HGB A1C): Hemoglobin A1C: 11.1

## 2013-12-30 MED ORDER — CYCLOBENZAPRINE HCL 10 MG PO TABS: 10.0000 mg | ORAL_TABLET | Freq: Every day | ORAL | Status: DC

## 2013-12-30 NOTE — Progress Notes (Signed)
   Subjective:    Patient ID: [name], female    DOB: 1940/10/17, 73 y.o.   MRN: [id number]  HPI  This is an unusual story for DM.  6 months ago had A1C at goal on oral agents.  Now, she is up to 35 units of lantus per day, blood sugars are in 200s and A1C = 11.  She has gained some wt with the insulin therapy, but it is unclear why she has had such a sudden increase in need for insulin.    I am most concerned about some occult underlying disease.  Her only symptom is easy fatigability.  No focal symptoms on thorough review of systems.        Review of Systems     Objective:   Physical Exam HEENT normal Neck supple  Lungs clear Cardiac RRR without m or g Abd benign. Ext no edema       Assessment & Plan:

## 2013-12-30 NOTE — Assessment & Plan Note (Signed)
Poorly controled with large, sudden need for insulin.  May need meal coverage.  RO occult disease.

## 2013-12-30 NOTE — Patient Instructions (Addendum)
You are odd.   I worry that you might have an underlying problem which is causing your blood sugar to go out of control.  Be aware of any new symptoms and let me know. Stop taking the glimiperide.   I will call Tuesday with blood work results.   Keep increasing your insulin up to 50 units daily. See me in a month - I might change that depending on what I learn with the blood work.

## 2013-12-30 NOTE — Assessment & Plan Note (Signed)
Well control.  Screening labs for occult disease.

## 2013-12-30 NOTE — Assessment & Plan Note (Signed)
Check sed rate as screen for occult disease.

## 2013-12-31 LAB — C-PEPTIDE: C-Peptide: 9.78 ng/mL — ABNORMAL HIGH (ref 0.80–3.90)

## 2013-12-31 LAB — SEDIMENTATION RATE: Sed Rate: 17 mm/hr (ref 0–22)

## 2014-01-03 ENCOUNTER — Telehealth: Payer: Self-pay | Admitting: Family Medicine

## 2014-01-03 NOTE — Telephone Encounter (Signed)
Pt called and wanted Dr. Andria Frames to know to call her with her test results on her cell phone since her house is not working. jw

## 2014-01-03 NOTE — Telephone Encounter (Signed)
Called and given results.  She will continue to escalate her insulin dose.

## 2014-01-04 ENCOUNTER — Ambulatory Visit: Payer: Medicare Other | Admitting: Family Medicine

## 2014-01-05 ENCOUNTER — Encounter: Payer: Self-pay | Admitting: Family Medicine

## 2014-01-05 ENCOUNTER — Other Ambulatory Visit: Payer: Self-pay | Admitting: Family Medicine

## 2014-01-05 DIAGNOSIS — G43909 Migraine, unspecified, not intractable, without status migrainosus: Secondary | ICD-10-CM

## 2014-01-05 DIAGNOSIS — E119 Type 2 diabetes mellitus without complications: Secondary | ICD-10-CM

## 2014-01-05 MED ORDER — TRAMADOL HCL 50 MG PO TABS: 50.0000 mg | ORAL_TABLET | Freq: Three times a day (TID) | ORAL | Status: DC | PRN

## 2014-01-05 MED ORDER — ENALAPRIL MALEATE 10 MG PO TABS: 10.0000 mg | ORAL_TABLET | Freq: Every day | ORAL | Status: DC

## 2014-01-11 NOTE — Telephone Encounter (Signed)
[name] is very concerned about continuing elevation of her b/s.  Still have high levels upon fasting checks.  Was 180 this am and 230 on Monday.  Also she is detecting a foul odor that she says is coming from her body.  Is quite concerned about this as she doesn't know if anyone else can detect when she's out.  Please call her as soon as you are available

## 2014-01-12 NOTE — Telephone Encounter (Signed)
Called and discussed.  Currently at 46 units of insulin.  She will continue to increase 1 unit daily until better control.  She believes she has an odor.  Long time since seen by the dentist.  Suggest an appointment to make sure no occult infection.

## 2014-01-12 NOTE — Telephone Encounter (Signed)
Called both home and mobile and left message.

## 2014-02-01 ENCOUNTER — Ambulatory Visit (INDEPENDENT_AMBULATORY_CARE_PROVIDER_SITE_OTHER): Payer: Medicare Other | Admitting: Family Medicine

## 2014-02-01 ENCOUNTER — Encounter: Payer: Self-pay | Admitting: Family Medicine

## 2014-02-01 VITALS — BP 122/74 | HR 72 | Temp 98.1°F | Ht 59.0 in | Wt 187.5 lb

## 2014-02-01 DIAGNOSIS — E1165 Type 2 diabetes mellitus with hyperglycemia: Secondary | ICD-10-CM

## 2014-02-01 DIAGNOSIS — IMO0001 Reserved for inherently not codable concepts without codable children: Secondary | ICD-10-CM

## 2014-02-01 DIAGNOSIS — L678 Other hair color and hair shaft abnormalities: Secondary | ICD-10-CM

## 2014-02-01 DIAGNOSIS — L738 Other specified follicular disorders: Secondary | ICD-10-CM

## 2014-02-01 DIAGNOSIS — L739 Follicular disorder, unspecified: Secondary | ICD-10-CM | POA: Insufficient documentation

## 2014-02-01 MED ORDER — INSULIN GLARGINE 100 UNIT/ML SOLOSTAR PEN: 57.0000 [IU] | PEN_INJECTOR | Freq: Every morning | SUBCUTANEOUS | Status: DC

## 2014-02-01 MED ORDER — CLINDAMYCIN PHOSPHATE 1 % EX SOLN: Freq: Two times a day (BID) | CUTANEOUS | Status: DC

## 2014-02-01 NOTE — Patient Instructions (Signed)
See me in two months. Check your blood sugar after meals a couple times a week.  Let me know if it is above 200 Work to include exercise in your daily routine.  Walk with [name]. Get your colonoscopy done

## 2014-02-02 NOTE — Assessment & Plan Note (Signed)
RX with topical clinda

## 2014-02-02 NOTE — Assessment & Plan Note (Signed)
Improved but I am uncertain of control.  Ask to check some post prandial BS to see if she needs meal coverage.

## 2014-02-02 NOTE — Progress Notes (Signed)
   Subjective:    Patient ID: [name], female    DOB: 15-May-1941, 73 y.o.   MRN: [id number]  HPI Diabetes - better control, now on lantus 57 units qam with FBS below 130.  We do not know her post prandial blood sugars.  Too early to recheck A1C.  C/O scalp sores and itching.  She knows she is due for a colonoscopy.    Review of Systems     Objective:   Physical Exam Scalp folliculitis. Lungs clear  Cardiac RRR without m or g       Assessment & Plan:

## 2014-03-14 ENCOUNTER — Telehealth: Payer: Self-pay | Admitting: Family Medicine

## 2014-03-14 MED ORDER — FLUCONAZOLE 150 MG PO TABS: ORAL_TABLET | ORAL | Status: DC

## 2014-03-14 NOTE — Telephone Encounter (Signed)
Pt called and needs a prescription for a yeast infection called in. jw

## 2014-03-14 NOTE — Telephone Encounter (Signed)
Will have [name] verify this is a yeast vaginal infection.

## 2014-03-14 NOTE — Telephone Encounter (Signed)
Please advise. [name] S  

## 2014-03-14 NOTE — Telephone Encounter (Signed)
Patient confirms its a yeast infection,at that point I informed her Rx will be at [facility].[name], [name]

## 2014-03-21 ENCOUNTER — Other Ambulatory Visit: Payer: Self-pay | Admitting: *Deleted

## 2014-03-21 DIAGNOSIS — G43909 Migraine, unspecified, not intractable, without status migrainosus: Secondary | ICD-10-CM

## 2014-03-22 MED ORDER — TRAMADOL HCL 50 MG PO TABS: 50.0000 mg | ORAL_TABLET | Freq: Three times a day (TID) | ORAL | Status: DC | PRN

## 2014-04-07 ENCOUNTER — Encounter: Payer: Self-pay | Admitting: Family Medicine

## 2014-04-07 ENCOUNTER — Telehealth: Payer: Self-pay | Admitting: *Deleted

## 2014-04-07 ENCOUNTER — Ambulatory Visit (INDEPENDENT_AMBULATORY_CARE_PROVIDER_SITE_OTHER): Payer: Medicare Other | Admitting: Family Medicine

## 2014-04-07 VITALS — BP 117/76 | HR 81 | Temp 98.2°F | Ht 59.0 in | Wt 189.0 lb

## 2014-04-07 DIAGNOSIS — E1165 Type 2 diabetes mellitus with hyperglycemia: Principal | ICD-10-CM

## 2014-04-07 DIAGNOSIS — IMO0001 Reserved for inherently not codable concepts without codable children: Secondary | ICD-10-CM

## 2014-04-07 LAB — POCT GLYCOSYLATED HEMOGLOBIN (HGB A1C): Hemoglobin A1C: 10.3

## 2014-04-07 MED ORDER — INSULIN ASPART 100 UNIT/ML CARTRIDGE (PENFILL): 5.0000 [IU] | Freq: Three times a day (TID) | SUBCUTANEOUS | Status: DC

## 2014-04-07 MED ORDER — INSULIN GLARGINE 100 UNIT/ML SOLOSTAR PEN: 57.0000 [IU] | PEN_INJECTOR | Freq: Every morning | SUBCUTANEOUS | Status: DC

## 2014-04-07 NOTE — Assessment & Plan Note (Signed)
Has become insulin requiring.  Will add mealtime coverage.  FU in 6 weeks.

## 2014-04-07 NOTE — Telephone Encounter (Signed)
Prior Authorization received from [employer] for [employer]. Formulary change to Humalog Kwikpen per [place] [facility] [place].  Verbal order given by Dr. Andria Frames to change to Humalog Kwikpen inject 5 Units into the skin three times a day with meals.  Dispense #15 called into [facility].  Pt is aware of change.    Derl Barrow, RN

## 2014-04-07 NOTE — Patient Instructions (Signed)
Start taking 5 units of the short acting insulin (Novolog) before each meal.   See me in 6 weeks to decide any adjustments.

## 2014-04-07 NOTE — Progress Notes (Signed)
   Subjective:    Patient ID: [name], female    DOB: July 21, 1941, 73 y.o.   MRN: [id number]  HPI Still having high blood sugars, especially in the afternoon.  She is on lantus 60. No other complaints.   Review of Systems     Objective:   Physical ExamLungs clear Cardiac RRR without m or g         Assessment & Plan:

## 2014-04-19 ENCOUNTER — Ambulatory Visit (INDEPENDENT_AMBULATORY_CARE_PROVIDER_SITE_OTHER): Payer: Medicare Other | Admitting: Family Medicine

## 2014-04-19 ENCOUNTER — Encounter: Payer: Self-pay | Admitting: Family Medicine

## 2014-04-19 VITALS — BP 142/89 | HR 91 | Temp 98.2°F | Wt 190.0 lb

## 2014-04-19 DIAGNOSIS — J189 Pneumonia, unspecified organism: Secondary | ICD-10-CM

## 2014-04-19 MED ORDER — CEPHALEXIN 500 MG PO CAPS: 500.0000 mg | ORAL_CAPSULE | Freq: Three times a day (TID) | ORAL | Status: DC

## 2014-04-19 MED ORDER — ALBUTEROL SULFATE HFA 108 (90 BASE) MCG/ACT IN AERS: 2.0000 | INHALATION_SPRAY | Freq: Four times a day (QID) | RESPIRATORY_TRACT | Status: DC | PRN

## 2014-04-19 MED ORDER — BENZONATATE 200 MG PO CAPS: 200.0000 mg | ORAL_CAPSULE | Freq: Three times a day (TID) | ORAL | Status: DC | PRN

## 2014-04-19 NOTE — Assessment & Plan Note (Addendum)
Viral vs bacterial URI vs pneumonia. Patient overall appears well, vitals stable. Given history of CAP, will elect to treat. Patient has allergy to amoxacillin and azithromycin, however did well with keflex 3 months ago Plan: keflex 500mg  TID x 7 days. OTC symptomatic treatment. F/u with PCP in 1 month or sooner if worsens

## 2014-04-19 NOTE — Progress Notes (Signed)
Patient ID: Vi Biddinger, female   DOB: 1940-12-09, 73 y.o.   MRN: [id number]   Subjective:    Patient ID: Makailyn Mccormick, female    DOB: 1940-10-21, 73 y.o.   MRN: [id number]  HPI  CC: Cough  # Cough:  Present for 1 week  Productive of green mucous  Associated symptoms: headaches, wheezing, runny nose, had bloody nose  No fevers, no difficulty breathing, no CP  States her daughter was diagnosed with pneumonia recently (also notes she is in hospital on chemo currently, husband also on chemo)  Review of Systems   See HPI for ROS. All other systems reviewed and are negative.  Past medical history, surgical, family, and social history reviewed and updated in the EMR. No new updates were made today. Objective:  BP 142/89  Pulse 91  Temp(Src) 98.2 F (36.8 C) (Oral)  Wt 190 lb (86.183 kg) Vitals reviewed  General: NAD HEENT: nasal turbinates mildly inflammed CV: RRR, normal s1/s2, no murmur appreciated. 2+ radial and PT pulses bilaterally Resp: intermittent crackles of lower lobes, no wheezing or rhonchi. Air movement good bilaterally Ext: no edema or cyanosis  Assessment & Plan:  See Problem List Documentation

## 2014-04-19 NOTE — Patient Instructions (Signed)
I sent prescriptions for an antibiotic called keflex. Do not get this filled or start taking this unless you are not feeling better over the next 3-4 days. In the meantime, I did prescribe you some albuterol inhaler that you can try to see if it helps with your symptoms.  Keep your appt with Dr. Andria Frames in October. If you are not feeling better or get worse, please call the clinic to come in sooner.

## 2014-04-26 ENCOUNTER — Ambulatory Visit (INDEPENDENT_AMBULATORY_CARE_PROVIDER_SITE_OTHER): Payer: Medicare Other | Admitting: Family Medicine

## 2014-04-26 VITALS — BP 167/97 | HR 115 | Temp 98.2°F | Ht 61.0 in | Wt 189.7 lb

## 2014-04-26 DIAGNOSIS — B029 Zoster without complications: Secondary | ICD-10-CM

## 2014-04-26 MED ORDER — FLUCONAZOLE 150 MG PO TABS: ORAL_TABLET | ORAL | Status: DC

## 2014-04-26 MED ORDER — VALACYCLOVIR HCL 1 G PO TABS: 1000.0000 mg | ORAL_TABLET | Freq: Three times a day (TID) | ORAL | Status: DC

## 2014-04-26 NOTE — Assessment & Plan Note (Signed)
No rash present yet but typical dermatomal pain and denies hx of post-herpetic neuralgia.  Will tx with Valtrex 1000 mg TID for 7 days along with tylenol/ibuprofen for moderate pain, neurontin 100 mg q 8 hours PRN, and if severe pain can take ultram 50 mg.  F/U PRN.

## 2014-04-26 NOTE — Patient Instructions (Signed)

## 2014-04-26 NOTE — Progress Notes (Signed)
[name] is a 73 y.o. female who presents today for R sided pain.  Pain - Pt states she has a hx of shingles x 2 episodes, despite receiving the shingles vaccination in the past.  She started having pain along this same dermatome about two days ago, and this increased yesterday until a burning pain/shooting pain woke up her this AM when she decided to come to the clinic.  She denies any rash, injury, fever, chills, or sweats.  States this is very similar to her previous episodes and has previously been on valtrex w/o any problems.    Past Medical History  Diagnosis Date  . Hypertension   . Asthma   . Migraine     History  Smoking status  . Former Smoker -- 2.00 packs/day for 33 years  . Types: Cigarettes  . Start date: 08/11/1957  . Quit date: 04/04/1991  Smokeless tobacco  . Never Used    Family History  Problem Relation Age of Onset  . Hearing loss Sister   . Heart attack Sister 73    s/p 4 bypasses   . Stroke Brother     34    Current Outpatient Prescriptions on File Prior to Visit  Medication Sig Dispense Refill  . albuterol (PROVENTIL HFA;VENTOLIN HFA) 108 (90 BASE) MCG/ACT inhaler Inhale 2 puffs into the lungs every 6 (six) hours as needed for wheezing or shortness of breath.  1 Inhaler  2  . aspirin 81 MG chewable tablet Chew 81 mg by mouth daily.        Marland Kitchen atenolol (TENORMIN) 25 MG tablet Take 1 tablet (25 mg total) by mouth daily.  90 tablet  3  . baclofen (LIORESAL) 10 MG tablet Take 1 tablet (10 mg total) by mouth every 6 (six) hours as needed. For migraine  180 each  3  . benzonatate (TESSALON) 200 MG capsule Take 1 capsule (200 mg total) by mouth 3 (three) times daily as needed for cough.  20 capsule  0  . bifidobacterium infantis (ALIGN) capsule Take 1 capsule by mouth 2 (two) times daily.        . Blood Glucose Monitoring Suppl (ONE TOUCH ULTRA 2) W/DEVICE KIT Dispense one device.  Diagnosis 250.00  1 each  0  . cephALEXin (KEFLEX) 500 MG capsule Take 1  capsule (500 mg total) by mouth 3 (three) times daily.  21 capsule  0  . cyclobenzaprine (FLEXERIL) 10 MG tablet Take 1 tablet (10 mg total) by mouth at bedtime.  90 tablet  3  . Diclofenac Potassium (CAMBIA) 50 MG PACK Per headache center      . Digestive Enzymes (SIMILASE PO) Take 1 capsule by mouth daily.      . enalapril (VASOTEC) 10 MG tablet Take 1 tablet (10 mg total) by mouth daily.  90 tablet  3  . famotidine (PEPCID) 40 MG tablet Take 20 mg by mouth daily.       . fluconazole (DIFLUCAN) 150 MG tablet Take 1 tab once a day for 2 days. Repeat in 1 week if needed.  4 tablet  0  . fluocinonide ointment (LIDEX) 0.05 % Apply topically 2 (two) times daily. Only use on hands and feet  30 g  3  . fluticasone (FLOVENT HFA) 220 MCG/ACT inhaler Inhale 1 puff into the lungs 2 (two) times daily.  1 Inhaler  12  . glimepiride (AMARYL) 4 MG tablet Take 1 tablet (4 mg total) by mouth daily with breakfast.  30 tablet  12  . insulin aspart (NOVOLOG PENFILL) cartridge Inject 5 Units into the skin 3 (three) times daily with meals.  15 mL  11  . Insulin Glargine (LANTUS SOLOSTAR) 100 UNIT/ML Solostar Pen Inject 57 Units into the skin every morning.  10 pen  12  . Insulin Pen Needle 32G X 6 MM MISC 57 Units by Does not apply route once.      . Lancets Misc. (ONE TOUCH SURESOFT) MISC 1 Device by Does not apply route as needed (Up to 4 times daily testing.  Diagnosis Code of 250.00).  1 each  prn  . loratadine (CLARITIN) 10 MG tablet Take 10 mg by mouth daily.        . Multiple Vitamins-Minerals (CENTRUM) tablet Take 1 tablet by mouth daily.        . nortriptyline (PAMELOR) 50 MG capsule Take 1 capsule (50 mg total) by mouth at bedtime.  90 capsule  3  . Omega-3 Fatty Acids (OMEGA 3 PO) Take 1 capsule by mouth daily.      . ONE TOUCH ULTRA TEST test strip USE AS DIRECTED TWICE DAILY  100 each  12  . simvastatin (ZOCOR) 20 MG tablet Take 1 tablet (20 mg total) by mouth daily.  90 tablet  3  . topiramate  (TOPAMAX) 50 MG tablet Take 3 tablets (150 mg total) by mouth at bedtime.  270 tablet  3  . traMADol (ULTRAM) 50 MG tablet Take 1 tablet (50 mg total) by mouth every 8 (eight) hours as needed. In response to your electronic request.  270 tablet  1  . vitamin C (ASCORBIC ACID) 250 MG tablet Take 250 mg by mouth daily.       No current facility-administered medications on file prior to visit.    ROS: Per HPI.  All other systems reviewed and are negative.   Physical Exam Filed Vitals:   04/26/14 1544  BP: 167/97  Pulse: 115  Temp: 98.2 F (36.8 C)    Physical Examination: General appearance - alert, well appearing, and in no distress Skin - slight erythematous rash appearing along the lower thoracic dermatome that stops at about the axillary line on the R.      Chemistry      Component Value Date/Time   NA 139 12/30/2013 1153   K 4.2 12/30/2013 1153   CL 105 12/30/2013 1153   CO2 26 12/30/2013 1153   BUN 19 12/30/2013 1153   CREATININE 0.91 12/30/2013 1153   CREATININE 0.98 05/23/2010 0306      Component Value Date/Time   CALCIUM 8.8 12/30/2013 1153   ALKPHOS 98 12/30/2013 1153   AST 66* 12/30/2013 1153   ALT 46* 12/30/2013 1153   BILITOT 0.4 12/30/2013 1153

## 2014-04-27 ENCOUNTER — Telehealth: Payer: Self-pay | Admitting: Family Medicine

## 2014-04-27 NOTE — Telephone Encounter (Signed)
Pt called because she was seen yesterday and diagnosed with Shingles. She is taking Tramadol for the pain and it didn't help she is also concerned because he blood sugar this morning was 86 and she doesn't think she should use the insulin because it might make it to low. She would like to speak to a nurse concerning this. jw

## 2014-04-27 NOTE — Telephone Encounter (Signed)
Noted and agree. 

## 2014-04-27 NOTE — Telephone Encounter (Signed)
Spoke with patient and precepted with Dr. Mingo Amber, patient advised to hold Lantus and morning dose of Novolog. Patient then states she was seen yesterday for shingles and was told to use neurontin, 800mg  of ibuprofen, and for severe pain to use her tramadol. Patient states she has been using all 3 of these medications and was still up all night with severe pain from shingles. Patient requesting something else for pain, will forward to MD seen yesterday and PCP.

## 2014-04-28 ENCOUNTER — Telehealth: Payer: Self-pay | Admitting: *Deleted

## 2014-04-28 MED ORDER — HYDROCODONE-ACETAMINOPHEN 5-325 MG PO TABS: 1.0000 | ORAL_TABLET | Freq: Four times a day (QID) | ORAL | Status: DC | PRN

## 2014-04-28 NOTE — Telephone Encounter (Signed)
Discussed patient with Latina Craver, will provide 10 tablets of Norco, script placed in front office. Patient reports itch with narcotics, no difficulty breathing or swelling.

## 2014-04-28 NOTE — Telephone Encounter (Signed)
Pt called stating that her pain medication (800 mg Ibuprofen, tramadol or gabapentin) is not helping relieve her pain associated with shingles.  Pt stated she had been up all night.  Precepted with Dr. Ree Kida; patient can pick up short supply of Vicodin today.  Pt informed that she can not take the tramadol and Vicodin together.  Pt stated understanding. Rx will be ready for pick up at front desk.   Derl Barrow, RN

## 2014-05-01 ENCOUNTER — Encounter: Payer: Self-pay | Admitting: Family Medicine

## 2014-05-01 ENCOUNTER — Other Ambulatory Visit: Payer: Self-pay | Admitting: Family Medicine

## 2014-05-01 ENCOUNTER — Ambulatory Visit (INDEPENDENT_AMBULATORY_CARE_PROVIDER_SITE_OTHER): Payer: Medicare Other | Admitting: Family Medicine

## 2014-05-01 VITALS — BP 118/80 | HR 72 | Temp 98.8°F | Resp 18 | Wt 206.0 lb

## 2014-05-01 DIAGNOSIS — B029 Zoster without complications: Secondary | ICD-10-CM

## 2014-05-01 DIAGNOSIS — R300 Dysuria: Secondary | ICD-10-CM

## 2014-05-01 DIAGNOSIS — R0789 Other chest pain: Secondary | ICD-10-CM | POA: Insufficient documentation

## 2014-05-01 LAB — POCT URINALYSIS DIPSTICK
Bilirubin, UA: NEGATIVE
Glucose, UA: NEGATIVE
Ketones, UA: NEGATIVE
Nitrite, UA: NEGATIVE
Protein, UA: NEGATIVE
Spec Grav, UA: 1.01
Urobilinogen, UA: 0.2
pH, UA: 7

## 2014-05-01 LAB — POCT UA - MICROSCOPIC ONLY

## 2014-05-01 MED ORDER — OXYCODONE-ACETAMINOPHEN 5-325 MG PO TABS: 1.0000 | ORAL_TABLET | Freq: Three times a day (TID) | ORAL | Status: DC | PRN

## 2014-05-01 MED ORDER — NITROFURANTOIN MONOHYD MACRO 100 MG PO CAPS: 100.0000 mg | ORAL_CAPSULE | Freq: Two times a day (BID) | ORAL | Status: DC

## 2014-05-01 MED ORDER — CEPHALEXIN 500 MG PO CAPS: 500.0000 mg | ORAL_CAPSULE | Freq: Three times a day (TID) | ORAL | Status: DC

## 2014-05-01 MED ORDER — RANITIDINE HCL 150 MG PO TABS: 150.0000 mg | ORAL_TABLET | Freq: Two times a day (BID) | ORAL | Status: DC

## 2014-05-01 NOTE — Progress Notes (Signed)
[name] is a 73 y.o. female who presents today for R sided pain.  Shingles- Pt states she has a hx of shingles x 2 episodes, despite receiving the shingles vaccination in the past.  Seen about 5 days ago for initial episode, placed on Valtrex TID, ibuprofen, and Vicodin PRN.  Pain is getting worse and now has in upper back.  Denies fever, chills, or sweats  States this is very similar to her previous episodes and has previously been on valtrex w/o any problems.    Past Medical History  Diagnosis Date  . Hypertension   . Asthma   . Migraine     History  Smoking status  . Former Smoker -- 2.00 packs/day for 33 years  . Types: Cigarettes  . Start date: 08/11/1957  . Quit date: 04/04/1991  Smokeless tobacco  . Never Used    Family History  Problem Relation Age of Onset  . Hearing loss Sister   . Heart attack Sister 33    s/p 4 bypasses   . Stroke Brother     37    Current Outpatient Prescriptions on File Prior to Visit  Medication Sig Dispense Refill  . albuterol (PROVENTIL HFA;VENTOLIN HFA) 108 (90 BASE) MCG/ACT inhaler Inhale 2 puffs into the lungs every 6 (six) hours as needed for wheezing or shortness of breath.  1 Inhaler  2  . aspirin 81 MG chewable tablet Chew 81 mg by mouth daily.        Marland Kitchen atenolol (TENORMIN) 25 MG tablet Take 1 tablet (25 mg total) by mouth daily.  90 tablet  3  . baclofen (LIORESAL) 10 MG tablet Take 1 tablet (10 mg total) by mouth every 6 (six) hours as needed. For migraine  180 each  3  . benzonatate (TESSALON) 200 MG capsule Take 1 capsule (200 mg total) by mouth 3 (three) times daily as needed for cough.  20 capsule  0  . bifidobacterium infantis (ALIGN) capsule Take 1 capsule by mouth 2 (two) times daily.        . Blood Glucose Monitoring Suppl (ONE TOUCH ULTRA 2) W/DEVICE KIT Dispense one device.  Diagnosis 250.00  1 each  0  . cephALEXin (KEFLEX) 500 MG capsule Take 1 capsule (500 mg total) by mouth 3 (three) times daily.  21 capsule  0  .  cyclobenzaprine (FLEXERIL) 10 MG tablet Take 1 tablet (10 mg total) by mouth at bedtime.  90 tablet  3  . Diclofenac Potassium (CAMBIA) 50 MG PACK Per headache center      . Digestive Enzymes (SIMILASE PO) Take 1 capsule by mouth daily.      . enalapril (VASOTEC) 10 MG tablet Take 1 tablet (10 mg total) by mouth daily.  90 tablet  3  . famotidine (PEPCID) 40 MG tablet Take 20 mg by mouth daily.       . fluconazole (DIFLUCAN) 150 MG tablet Take 1 tab once a day for 2 days. Repeat in 1 week if needed.  4 tablet  0  . fluocinonide ointment (LIDEX) 0.05 % Apply topically 2 (two) times daily. Only use on hands and feet  30 g  3  . fluticasone (FLOVENT HFA) 220 MCG/ACT inhaler Inhale 1 puff into the lungs 2 (two) times daily.  1 Inhaler  12  . glimepiride (AMARYL) 4 MG tablet Take 1 tablet (4 mg total) by mouth daily with breakfast.  30 tablet  12  . HYDROcodone-acetaminophen (NORCO) 5-325 MG per tablet Take 1  tablet by mouth every 6 (six) hours as needed for moderate pain.  10 tablet  0  . insulin aspart (NOVOLOG PENFILL) cartridge Inject 5 Units into the skin 3 (three) times daily with meals.  15 mL  11  . Insulin Glargine (LANTUS SOLOSTAR) 100 UNIT/ML Solostar Pen Inject 57 Units into the skin every morning.  10 pen  12  . Insulin Pen Needle 32G X 6 MM MISC 57 Units by Does not apply route once.      . Lancets Misc. (ONE TOUCH SURESOFT) MISC 1 Device by Does not apply route as needed (Up to 4 times daily testing.  Diagnosis Code of 250.00).  1 each  prn  . loratadine (CLARITIN) 10 MG tablet Take 10 mg by mouth daily.        . Multiple Vitamins-Minerals (CENTRUM) tablet Take 1 tablet by mouth daily.        . nortriptyline (PAMELOR) 50 MG capsule Take 1 capsule (50 mg total) by mouth at bedtime.  90 capsule  3  . Omega-3 Fatty Acids (OMEGA 3 PO) Take 1 capsule by mouth daily.      . ONE TOUCH ULTRA TEST test strip USE AS DIRECTED TWICE DAILY  100 each  12  . simvastatin (ZOCOR) 20 MG tablet Take 1  tablet (20 mg total) by mouth daily.  90 tablet  3  . topiramate (TOPAMAX) 50 MG tablet Take 3 tablets (150 mg total) by mouth at bedtime.  270 tablet  3  . traMADol (ULTRAM) 50 MG tablet Take 1 tablet (50 mg total) by mouth every 8 (eight) hours as needed. In response to your electronic request.  270 tablet  1  . valACYclovir (VALTREX) 1000 MG tablet Take 1 tablet (1,000 mg total) by mouth 3 (three) times daily.  21 tablet  0  . vitamin C (ASCORBIC ACID) 250 MG tablet Take 250 mg by mouth daily.       No current facility-administered medications on file prior to visit.    ROS: Per HPI.  All other systems reviewed and are negative.   Physical Exam Filed Vitals:   05/01/14 1145  BP: 118/80  Pulse: 72  Temp: 98.8 F (37.1 C)  Resp: 18    Physical Examination: General appearance - alert, well appearing, and in no distress Skin - erythematous papules along T10 dermatome on the R     Chemistry      Component Value Date/Time   NA 139 12/30/2013 1153   K 4.2 12/30/2013 1153   CL 105 12/30/2013 1153   CO2 26 12/30/2013 1153   BUN 19 12/30/2013 1153   CREATININE 0.91 12/30/2013 1153   CREATININE 0.98 05/23/2010 0306      Component Value Date/Time   CALCIUM 8.8 12/30/2013 1153   ALKPHOS 98 12/30/2013 1153   AST 66* 12/30/2013 1153   ALT 46* 12/30/2013 1153   BILITOT 0.4 12/30/2013 1153

## 2014-05-01 NOTE — Assessment & Plan Note (Signed)
Continue valtrex until gone.  Will Rx Percocet for severe pain, continue with ibuprofen.  Recommend starting Neurontin 300 mg TID PRN for neuorpathic pain.  F/U in one week.

## 2014-05-01 NOTE — Assessment & Plan Note (Signed)
Description as non cardiac CP w/o hx of HTN, HLD, smoking hx, early family hx of cardiac disease make ACS very low likelihood (Heart/TIMI both 0).  As well, Well's of 0 w/ no tachypnea or tachycardia making PE very low likelihood as well w/ O2 sat of 100% on RA.  Favor esophagitis/Reflux w/ her description and will try zantac or prilosec OTC.  RTC emphasized for worsening SOB/CP w/ activity relieved by rest, dizziness.  F/U in 2-3 days if not getting better on medications.  Could be paroxysmal afib as well w/ drinking history but RRR today as well.

## 2014-05-01 NOTE — Patient Instructions (Signed)
Please take the Percocet every 4-6 hours for pain. Please take Gabapentin (Neurontin) 300 mg every 8 hours for pain. Continue with the ibuprofen.  Thanks, Dr. Awanda Mink

## 2014-05-02 ENCOUNTER — Telehealth: Payer: Self-pay | Admitting: Family Medicine

## 2014-05-02 NOTE — Telephone Encounter (Signed)
Please have her take the Neurontin 300 mg three times per day.  Thanks [employer]. Awanda Mink, DO of [facility] 05/02/2014, 5:08 PM

## 2014-05-02 NOTE — Telephone Encounter (Signed)
Pt called because she was seen yesterday and given Oxycodone but this is not helping and she is unsure what she is to do. Please call to discuss what else she can do. jw

## 2014-05-04 NOTE — Telephone Encounter (Signed)
Pt calls again, Neurontin is not working and pt is in excruciating pain. Advised pt that [name] will have Dr. Andria Frames to call her.

## 2014-05-04 NOTE — Telephone Encounter (Signed)
Called and discussed.  I tweaked the doses of ibuprofen and gabapentin.  Also discussed that the diagnosis sounds right - and we should keep our eyes open for new symptoms that might suggest an alternative diagnosis.

## 2014-05-05 ENCOUNTER — Telehealth: Payer: Self-pay | Admitting: Family Medicine

## 2014-05-05 MED ORDER — OXYCODONE-ACETAMINOPHEN 5-325 MG PO TABS: 1.0000 | ORAL_TABLET | Freq: Three times a day (TID) | ORAL | Status: DC | PRN

## 2014-05-05 NOTE — Telephone Encounter (Signed)
Pt called and needs a refill on her oxycodone. She has shingles and will not have enough to get her through the weekend. Please have anyone write a refill and leave it up front. jw

## 2014-05-05 NOTE — Telephone Encounter (Signed)
Patient requiring one tablet of Norco every 4-6 hours, still taking Gabapentin 300 TID, still having pain from Shingles, requesting refill of pain medications, will print prescription for 30 more tablets, placed in front office

## 2014-05-17 ENCOUNTER — Ambulatory Visit (INDEPENDENT_AMBULATORY_CARE_PROVIDER_SITE_OTHER): Payer: Medicare Other | Admitting: Family Medicine

## 2014-05-17 ENCOUNTER — Encounter: Payer: Self-pay | Admitting: Family Medicine

## 2014-05-17 VITALS — BP 143/84 | HR 82 | Temp 98.3°F | Ht 61.0 in | Wt 187.0 lb

## 2014-05-17 DIAGNOSIS — B0229 Other postherpetic nervous system involvement: Secondary | ICD-10-CM

## 2014-05-17 DIAGNOSIS — Z23 Encounter for immunization: Secondary | ICD-10-CM

## 2014-05-17 DIAGNOSIS — Z794 Long term (current) use of insulin: Secondary | ICD-10-CM

## 2014-05-17 DIAGNOSIS — E119 Type 2 diabetes mellitus without complications: Secondary | ICD-10-CM

## 2014-05-17 NOTE — Patient Instructions (Signed)
I look forward to seeing your November A1C. Take the gabapentin if you want for the lingering shingles pain. I do not know why you keep getting shingles. Unlucky I guess. You will get your flu shot and the second pneumonia shot today.

## 2014-05-18 DIAGNOSIS — B0229 Other postherpetic nervous system involvement: Secondary | ICD-10-CM | POA: Insufficient documentation

## 2014-05-18 NOTE — Progress Notes (Signed)
   Subjective:    Patient ID: [name], female    DOB: 06/01/1941, 73 y.o.   MRN: [id number]  HPI Several issues Lingering pain from shingles.  Rash clear Fasting blood sugars are all below 140.  She will check some afternoon ones.  We look forward to her November A1C Needs flu and pneumonia shots.    Review of Systems     Objective:   Physical Exam Lungs clear Cardiac RRR without m or g       Assessment & Plan:

## 2014-05-18 NOTE — Assessment & Plan Note (Signed)
Seems to be coming back under control

## 2014-05-18 NOTE — Assessment & Plan Note (Signed)
Mild.  Use gabapentin

## 2014-06-30 ENCOUNTER — Ambulatory Visit (INDEPENDENT_AMBULATORY_CARE_PROVIDER_SITE_OTHER): Payer: Medicare Other | Admitting: Family Medicine

## 2014-06-30 ENCOUNTER — Encounter: Payer: Self-pay | Admitting: Family Medicine

## 2014-06-30 VITALS — BP 137/77 | HR 80 | Temp 97.9°F | Ht 61.0 in | Wt 188.6 lb

## 2014-06-30 DIAGNOSIS — E119 Type 2 diabetes mellitus without complications: Secondary | ICD-10-CM

## 2014-06-30 DIAGNOSIS — Z794 Long term (current) use of insulin: Secondary | ICD-10-CM

## 2014-06-30 DIAGNOSIS — N760 Acute vaginitis: Secondary | ICD-10-CM

## 2014-06-30 LAB — POCT GLYCOSYLATED HEMOGLOBIN (HGB A1C): Hemoglobin A1C: 10.6

## 2014-06-30 MED ORDER — FLUCONAZOLE 150 MG PO TABS: 150.0000 mg | ORAL_TABLET | Freq: Once | ORAL | Status: DC

## 2014-06-30 MED ORDER — BENZONATATE 200 MG PO CAPS: 200.0000 mg | ORAL_CAPSULE | Freq: Three times a day (TID) | ORAL | Status: DC | PRN

## 2014-06-30 NOTE — Progress Notes (Signed)
   Subjective:    Patient ID: [name], female    DOB: 10/05/1940, 73 y.o.   MRN: [id number]  HPI left foot/ankle pain achilles Seen by ortho and podiatry.  Dx Achilles tendonitis.  Frustrated.   Diabetes.  Home blood sugars all over the place  130-200 fasting.  A1C is 10.6  She admits she has not been diligent about mealtime coverage. Socio economic: In donut hole for drug coverage.  Filled out form for lantus solostar assistance for her and faxed.  If approved, drugs should come to our office.    Review of Systems     Objective:   Physical Exam Lungs clear Cardiac RRR without m or g       Assessment & Plan:

## 2014-06-30 NOTE — Assessment & Plan Note (Signed)
Still terrible control.  Will start with being diligent about mealtime coverage.  She will see me in 6 weeks with her BS log.

## 2014-06-30 NOTE — Patient Instructions (Signed)
I will fax the paperwork Stretch your achilles like we discussed Get diligent about mealtime coverage with insulin.   You know you are due for a colonoscopy.   When your ankle is better, get walking more.   See me in 6 weeks with your blood sugar log.  I may want to make more insulin adjustments.

## 2014-07-04 ENCOUNTER — Telehealth: Payer: Self-pay | Admitting: Family Medicine

## 2014-07-04 DIAGNOSIS — Z794 Long term (current) use of insulin: Secondary | ICD-10-CM

## 2014-07-04 DIAGNOSIS — E119 Type 2 diabetes mellitus without complications: Secondary | ICD-10-CM

## 2014-07-04 NOTE — Telephone Encounter (Signed)
Pt called and needs refills on her lancets and strips called in to the pharmacy with a the correct diagnoses code on the prescription . jw

## 2014-07-05 MED ORDER — GLUCOSE BLOOD VI STRP: 1.0000 | ORAL_STRIP | Freq: Three times a day (TID) | Status: DC

## 2014-07-05 MED ORDER — ONETOUCH SURESOFT LANCING DEV MISC: 1.0000 | Freq: Three times a day (TID) | Status: DC

## 2014-07-05 NOTE — Assessment & Plan Note (Signed)
Refill per request

## 2014-07-12 ENCOUNTER — Other Ambulatory Visit: Payer: Self-pay | Admitting: Family Medicine

## 2014-07-14 ENCOUNTER — Emergency Department (HOSPITAL_COMMUNITY)
Admission: EM | Admit: 2014-07-14 | Discharge: 2014-07-14 | Disposition: A | Discharge status: Home / Self Care | Payer: Medicare Other | Source: Home / Self Care

## 2014-07-14 ENCOUNTER — Encounter (HOSPITAL_COMMUNITY): Payer: Self-pay | Admitting: *Deleted

## 2014-07-14 DIAGNOSIS — N390 Urinary tract infection, site not specified: Secondary | ICD-10-CM

## 2014-07-14 LAB — POCT URINALYSIS DIP (DEVICE)
Bilirubin Urine: NEGATIVE
Glucose, UA: NEGATIVE mg/dL
Ketones, ur: NEGATIVE mg/dL
Nitrite: NEGATIVE
Protein, ur: NEGATIVE mg/dL
Specific Gravity, Urine: 1.02 (ref 1.005–1.030)
Urobilinogen, UA: 0.2 mg/dL (ref 0.0–1.0)
pH: 7 (ref 5.0–8.0)

## 2014-07-14 NOTE — ED Notes (Signed)
Pt reports  Scanty    Urinary  Out put          With  Low  abd pressure        Symptoms  Started  Today

## 2014-07-14 NOTE — Discharge Instructions (Signed)
Drink  Lots   Fluids  Take  Medication  As  Prescribed     Return if  Any fever  Vomiting abd  Pain           Urinary Tract Infection Urinary tract infections (UTIs) can develop anywhere along your urinary tract. Your urinary tract is your body's drainage system for removing wastes and extra water. Your urinary tract includes two kidneys, two ureters, a bladder, and a urethra. Your kidneys are a pair of bean-shaped organs. Each kidney is about the size of your fist. They are located below your ribs, one on each side of your spine. CAUSES Infections are caused by microbes, which are microscopic organisms, including fungi, viruses, and bacteria. These organisms are so small that they can only be seen through a microscope. Bacteria are the microbes that most commonly cause UTIs. SYMPTOMS  Symptoms of UTIs may vary by age and gender of the patient and by the location of the infection. Symptoms in young women typically include a frequent and intense urge to urinate and a painful, burning feeling in the bladder or urethra during urination. Older women and men are more likely to be tired, shaky, and weak and have muscle aches and abdominal pain. A fever may mean the infection is in your kidneys. Other symptoms of a kidney infection include pain in your back or sides below the ribs, nausea, and vomiting. DIAGNOSIS To diagnose a UTI, your caregiver will ask you about your symptoms. Your caregiver also will ask to provide a urine sample. The urine sample will be tested for bacteria and white blood cells. White blood cells are made by your body to help fight infection. TREATMENT  Typically, UTIs can be treated with medication. Because most UTIs are caused by a bacterial infection, they usually can be treated with the use of antibiotics. The choice of antibiotic and length of treatment depend on your symptoms and the type of bacteria causing your infection. HOME CARE INSTRUCTIONS  If you were prescribed antibiotics,  take them exactly as your caregiver instructs you. Finish the medication even if you feel better after you have only taken some of the medication.  Drink enough water and fluids to keep your urine clear or pale yellow.  Avoid caffeine, tea, and carbonated beverages. They tend to irritate your bladder.  Empty your bladder often. Avoid holding urine for long periods of time.  Empty your bladder before and after sexual intercourse.  After a bowel movement, women should cleanse from front to back. Use each tissue only once. SEEK MEDICAL CARE IF:   You have back pain.  You develop a fever.  Your symptoms do not begin to resolve within 3 days. SEEK IMMEDIATE MEDICAL CARE IF:   You have severe back pain or lower abdominal pain.  You develop chills.  You have nausea or vomiting.  You have continued burning or discomfort with urination. MAKE SURE YOU:   Understand these instructions.  Will watch your condition.  Will get help right away if you are not doing well or get worse. Document Released: 05/07/2005 Document Revised: 01/27/2012 Document Reviewed: 09/05/2011 [facility] Patient Information 2015 [place], [place]. This information is not intended to replace advice given to you by your health care provider. Make sure you discuss any questions you have with your health care provider.

## 2014-07-17 ENCOUNTER — Telehealth: Payer: Self-pay | Admitting: Family Medicine

## 2014-07-17 DIAGNOSIS — N3001 Acute cystitis with hematuria: Secondary | ICD-10-CM

## 2014-07-17 MED ORDER — NITROFURANTOIN MONOHYD MACRO 100 MG PO CAPS: 100.0000 mg | ORAL_CAPSULE | Freq: Two times a day (BID) | ORAL | Status: DC

## 2014-07-17 NOTE — Telephone Encounter (Signed)
Pt was seen at [facility] on Friday for a UTI. They prescribed some antibiotics but she thinks that she is allergic to them because she is itching everywhere. She wanted to know if the doctor could call in something else. jw

## 2014-07-17 NOTE — ED Provider Notes (Signed)
CSN: [id number]     Arrival date & time 07/14/14  1516 History   None    Chief Complaint  Patient presents with  . Dysuria   (Consider location/radiation/quality/duration/timing/severity/associated sxs/prior Treatment) Patient is a 73 y.o. female presenting with dysuria. The history is provided by the patient and a relative.  Dysuria Quality: urgency. Pain severity:  Mild Onset quality:  Gradual Duration:  1 day Chronicity:  New Recent urinary tract infections: no   Worsened by:  Nothing tried Urinary symptoms: hesitancy   Associated symptoms: no abdominal pain, no fever, no flank pain, no nausea and no vomiting   Risk factors: no recurrent urinary tract infections     Past Medical History  Diagnosis Date  . Hypertension   . Asthma   . Migraine    Past Surgical History  Procedure Laterality Date  . Appendectomy    . Abdominal hysterectomy    . Arm surgery     Family History  Problem Relation Age of Onset  . Hearing loss Sister   . Heart attack Sister 36    s/p 4 bypasses   . Stroke Brother     56   History  Substance Use Topics  . Smoking status: Former Smoker -- 2.00 packs/day for 33 years    Types: Cigarettes    Start date: 08/11/1957    Quit date: 04/04/1991  . Smokeless tobacco: Never Used  . Alcohol Use: No   OB History    No data available     Review of Systems  Constitutional: Negative.  Negative for fever and chills.  Gastrointestinal: Negative.  Negative for nausea, vomiting and abdominal pain.  Genitourinary: Positive for dysuria and urgency. Negative for flank pain and pelvic pain.    Allergies  Amoxicillin; Azithromycin; Butalbital; Clindamycin hcl; Hydrocodone-acetaminophen; Januvia; Meperidine hcl; Sulfamethoxazole; Codeine phosphate; Etodolac; Gabapentin; Metformin and related; Naproxen; Oxycodone; Pentazocine-naloxone; Sumatriptan; and Zonegran  Home Medications   Prior to Admission medications   Medication Sig Start Date End Date  Taking? Authorizing Provider  albuterol (PROVENTIL HFA;VENTOLIN HFA) 108 (90 BASE) MCG/ACT inhaler Inhale 2 puffs into the lungs every 6 (six) hours as needed for wheezing or shortness of breath. 04/19/14   Leone Brand, MD  aspirin 81 MG chewable tablet Chew 81 mg by mouth daily.      Historical Provider, MD  atenolol (TENORMIN) 25 MG tablet Take 1 tablet (25 mg total) by mouth daily. 08/25/13   Zigmund Gottron, MD  baclofen (LIORESAL) 10 MG tablet Take 1 tablet (10 mg total) by mouth every 6 (six) hours as needed. For migraine 12/15/12   Zigmund Gottron, MD  benzonatate (TESSALON) 200 MG capsule Take 1 capsule (200 mg total) by mouth 3 (three) times daily as needed for cough. 06/30/14   Zigmund Gottron, MD  bifidobacterium infantis (ALIGN) capsule Take 1 capsule by mouth 2 (two) times daily.      Historical Provider, MD  Blood Glucose Monitoring Suppl (ONE TOUCH ULTRA 2) W/DEVICE KIT Dispense one device.  Diagnosis 250.00 10/17/13   Zigmund Gottron, MD  cyclobenzaprine (FLEXERIL) 10 MG tablet Take 1 tablet (10 mg total) by mouth at bedtime. 12/30/13   Zigmund Gottron, MD  Diclofenac Potassium (CAMBIA) 50 MG PACK Per headache center    Historical Provider, MD  Digestive Enzymes (SIMILASE PO) Take 1 capsule by mouth daily.    Historical Provider, MD  enalapril (VASOTEC) 10 MG tablet Take 1 tablet (10 mg total) by mouth daily. 01/05/14  Zigmund Gottron, MD  famotidine (PEPCID) 40 MG tablet Take 20 mg by mouth daily.     Historical Provider, MD  fluconazole (DIFLUCAN) 150 MG tablet Take 1 tablet (150 mg total) by mouth once. 06/30/14   Zigmund Gottron, MD  fluocinonide ointment (LIDEX) 0.05 % Apply topically 2 (two) times daily. Only use on hands and feet 08/18/12   Zigmund Gottron, MD  fluticasone (FLOVENT HFA) 220 MCG/ACT inhaler Inhale 1 puff into the lungs 2 (two) times daily. 09/27/12   Zigmund Gottron, MD  glimepiride (AMARYL) 4 MG tablet Take 1 tablet  (4 mg total) by mouth daily with breakfast. 09/07/13   Zigmund Gottron, MD  glucose blood (ONE TOUCH ULTRA TEST) test strip 1 each by Other route 3 (three) times daily. Test blood sugar.  Diagnosis code E11.9, Z79.4 07/05/14   Zigmund Gottron, MD  insulin aspart (NOVOLOG PENFILL) cartridge Inject 5 Units into the skin 3 (three) times daily with meals. 04/07/14   Zigmund Gottron, MD  Insulin Glargine (LANTUS SOLOSTAR) 100 UNIT/ML Solostar Pen Inject 57 Units into the skin every morning. 04/07/14   Zigmund Gottron, MD  Insulin Pen Needle 32G X 6 MM MISC 57 Units by Does not apply route once. 10/11/13   Zigmund Gottron, MD  Lancets Misc. (ONE TOUCH SURESOFT) MISC 1 Device by Does not apply route 3 (three) times daily. Test blood sugar.  Diagnosis code E11.9, Z79.4 07/05/14   Zigmund Gottron, MD  loratadine (CLARITIN) 10 MG tablet Take 10 mg by mouth daily.      Historical Provider, MD  Multiple Vitamins-Minerals (CENTRUM) tablet Take 1 tablet by mouth daily.      Historical Provider, MD  nitrofurantoin, macrocrystal-monohydrate, (MACROBID) 100 MG capsule Take 1 capsule (100 mg total) by mouth 2 (two) times daily. 07/17/14   Billy Fischer, MD  nortriptyline (PAMELOR) 50 MG capsule Take 1 capsule (50 mg total) by mouth at bedtime. 11/02/13   Zigmund Gottron, MD  Omega-3 Fatty Acids (OMEGA 3 PO) Take 1 capsule by mouth daily.    Historical Provider, MD  oxyCODONE-acetaminophen (ROXICET) 5-325 MG per tablet Take 1 tablet by mouth every 8 (eight) hours as needed for severe pain. 05/05/14   Lupita Dawn, MD  simvastatin (ZOCOR) 20 MG tablet TAKE 1 BY MOUTH DAILY 07/13/14   Zigmund Gottron, MD  topiramate (TOPAMAX) 50 MG tablet Take 3 tablets (150 mg total) by mouth at bedtime. 04/29/13   Zigmund Gottron, MD  traMADol (ULTRAM) 50 MG tablet Take 1 tablet (50 mg total) by mouth every 8 (eight) hours as needed. In response to your electronic request. 03/22/14   Zigmund Gottron, MD  valACYclovir (VALTREX) 1000 MG tablet Take 1 tablet (1,000 mg total) by mouth 3 (three) times daily. 04/26/14   Nolon Rod, DO  vitamin C (ASCORBIC ACID) 250 MG tablet Take 250 mg by mouth daily.    Historical Provider, MD   BP 128/65 mmHg  Pulse 85  Temp(Src) 99 F (37.2 C) (Oral)  Resp 18  SpO2 95% Physical Exam  Constitutional: She is oriented to person, place, and time. She appears well-developed and well-nourished.  Abdominal: Soft. Bowel sounds are normal. She exhibits no distension and no mass. There is no tenderness. There is no rebound and no guarding.  Neurological: She is alert and oriented to person, place, and time.  Skin: Skin is warm and dry.  Nursing note and vitals  reviewed.   ED Course  Procedures (including critical care time) Labs Review Labs Reviewed  POCT URINALYSIS DIP (DEVICE) - Abnormal; Notable for the following:    Hgb urine dipstick TRACE (*)    Leukocytes, UA LARGE (*)    All other components within normal limits    Imaging Review No results found.   MDM   1. UTI (lower urinary tract infection)        Billy Fischer, MD 07/18/14 [phone]

## 2014-07-18 MED ORDER — CEPHALEXIN 500 MG PO CAPS: 500.0000 mg | ORAL_CAPSULE | Freq: Three times a day (TID) | ORAL | Status: DC

## 2014-07-18 NOTE — Telephone Encounter (Signed)
LVM for patient to call back. Wanted to double check and make sure that it is the Macrobid that was prescribed.

## 2014-07-18 NOTE — Telephone Encounter (Signed)
Pt called and wanted to know if the doctor had decided to change her prescription. Please call jw

## 2014-07-18 NOTE — Telephone Encounter (Signed)
patient states that the rx for Macrobid has broke her out and she now is itching, is there anything that can be prescribed for her of does she need to be seen first.

## 2014-08-14 ENCOUNTER — Other Ambulatory Visit: Payer: Self-pay | Admitting: Family Medicine

## 2014-08-15 ENCOUNTER — Telehealth: Payer: Self-pay | Admitting: Family Medicine

## 2014-08-15 MED ORDER — BACLOFEN 10 MG PO TABS: ORAL_TABLET | ORAL | Status: DC

## 2014-08-15 MED ORDER — NORTRIPTYLINE HCL 50 MG PO CAPS: 50.0000 mg | ORAL_CAPSULE | Freq: Every day | ORAL | Status: DC

## 2014-08-15 NOTE — Telephone Encounter (Signed)
Needs a short fill for 2 RX---nortriptyline (only has 4 days left) and baclofen ( only 6 left) It was sent to the mail order place yesterday but it will take 7-10 business days before she gets them She needs a new Rx sent to [facility]

## 2014-08-23 ENCOUNTER — Telehealth: Payer: Self-pay | Admitting: Family Medicine

## 2014-08-23 ENCOUNTER — Other Ambulatory Visit: Payer: Self-pay | Admitting: Family Medicine

## 2014-08-23 DIAGNOSIS — G43009 Migraine without aura, not intractable, without status migrainosus: Secondary | ICD-10-CM

## 2014-08-23 MED ORDER — TOPIRAMATE 50 MG PO TABS: 150.0000 mg | ORAL_TABLET | Freq: Every day | ORAL | Status: DC

## 2014-08-23 NOTE — Telephone Encounter (Signed)
Needs a "short fill" on her topiramate Is going on vacation tomorrow and doesn't have enough to last Needs to go to [facility]

## 2014-08-30 ENCOUNTER — Encounter: Payer: Self-pay | Admitting: Family Medicine

## 2014-08-30 ENCOUNTER — Ambulatory Visit (INDEPENDENT_AMBULATORY_CARE_PROVIDER_SITE_OTHER): Payer: Medicare Other | Admitting: Family Medicine

## 2014-08-30 ENCOUNTER — Ambulatory Visit: Payer: Self-pay | Admitting: Family Medicine

## 2014-08-30 VITALS — BP 137/66 | HR 87 | Temp 97.9°F | Ht 61.0 in | Wt 192.1 lb

## 2014-08-30 DIAGNOSIS — Z794 Long term (current) use of insulin: Secondary | ICD-10-CM

## 2014-08-30 DIAGNOSIS — E119 Type 2 diabetes mellitus without complications: Secondary | ICD-10-CM

## 2014-08-30 NOTE — Patient Instructions (Signed)
I want your to start giving yourself more short acting insulin before each meal based on your blood sugar reading.  Increase the dose 1 unit at a time.  The goal is to definitely keep your blood sugars below 200, ideally below 150.  You will be due for an A1C recheck in late Feb.

## 2014-08-31 NOTE — Assessment & Plan Note (Signed)
Will have her slowly increase her meal coverage.  FU six weeks and will need A1C

## 2014-08-31 NOTE — Progress Notes (Signed)
   Subjective:    Patient ID: [name], female    DOB: 01/23/1941, 74 y.o.   MRN: [id number]  HPI Continues to have high home BS.  She has become insulin requiring in the last year and that insulin requirement has skyrocketed.  She is as active as usual and is holding her weight down decently.  Stress is high.  Daughter in law with metastatic cancer and the outcome looks grim. Current insulin regimen is 60 u lantus daily and 5 units short acting each meal.  Last A1C was 10.  BS running above 200.   Review of Systems     Objective:   Physical ExamLungs clear Cardiac RRR without m or g        Assessment & Plan:

## 2014-09-04 ENCOUNTER — Other Ambulatory Visit: Payer: Self-pay | Admitting: *Deleted

## 2014-09-04 DIAGNOSIS — G43919 Migraine, unspecified, intractable, without status migrainosus: Secondary | ICD-10-CM

## 2014-09-04 MED ORDER — TRAMADOL HCL 50 MG PO TABS: 50.0000 mg | ORAL_TABLET | Freq: Three times a day (TID) | ORAL | Status: DC | PRN

## 2014-09-04 NOTE — Telephone Encounter (Signed)
Rx for Tramadol 50 mg faxed to [facility].  Derl Barrow, RN

## 2014-10-04 ENCOUNTER — Other Ambulatory Visit: Payer: Self-pay | Admitting: Family Medicine

## 2014-10-06 ENCOUNTER — Ambulatory Visit (INDEPENDENT_AMBULATORY_CARE_PROVIDER_SITE_OTHER): Payer: Medicare Other | Admitting: Family Medicine

## 2014-10-06 ENCOUNTER — Encounter: Payer: Self-pay | Admitting: Family Medicine

## 2014-10-06 VITALS — BP 133/73 | HR 104 | Temp 98.1°F | Ht 61.0 in | Wt 188.8 lb

## 2014-10-06 DIAGNOSIS — E7800 Pure hypercholesterolemia, unspecified: Secondary | ICD-10-CM

## 2014-10-06 DIAGNOSIS — E119 Type 2 diabetes mellitus without complications: Secondary | ICD-10-CM

## 2014-10-06 DIAGNOSIS — Z794 Long term (current) use of insulin: Secondary | ICD-10-CM

## 2014-10-06 LAB — POCT GLYCOSYLATED HEMOGLOBIN (HGB A1C): Hemoglobin A1C: 10.9

## 2014-10-06 MED ORDER — METFORMIN HCL 500 MG PO TABS: 250.0000 mg | ORAL_TABLET | Freq: Two times a day (BID) | ORAL | Status: DC

## 2014-10-06 NOTE — Assessment & Plan Note (Signed)
Clearly she has significant insulin resistance.  Discussed diet and exercise.  Stress may be playing a major role.  Her daughter-in-law is dying of metastatic cancer in early 27s.   Will retry metformin at very low dose.  Otherwise will just keep cranking up insulin dose. Will need injection education if switches from pen to vial.

## 2014-10-06 NOTE — Progress Notes (Signed)
   Subjective:    Patient ID: [name], female    DOB: Nov 10, 1940, 74 y.o.   MRN: [id number]  HPI We have been having a terrible time with [name]'s DM.  She has gone from well controled on oral meds to poorly controled on large doses of insulin in one year.  A1C today is 10.9 despite lantus 60 units daily and novolog 15 units with each meal.  Could not tolerate metformin in the past due to diarrhea. Also concerned about cost of pens.  She is willing to try vials but will need to be taught injections.      Review of Systems     Objective:   Physical ExamLungs clear Cardiac RRR without m or g         Assessment & Plan:

## 2014-10-06 NOTE — Patient Instructions (Signed)
Try on a very low dose of the metformin and see if you can tolerate Also check the cost difference between pens and vials Call me in two weeks to let me know what you have found out. I will likely set you up to see Dr. Valentina Lucks next - but I will wait for the phone call.

## 2014-10-06 NOTE — Assessment & Plan Note (Signed)
Due for check 

## 2014-10-07 LAB — LIPID PANEL
Cholesterol: 133 mg/dL (ref 0–200)
HDL: 40 mg/dL — ABNORMAL LOW (ref >=46)
LDL Cholesterol: 50 mg/dL (ref 0–99)
Total CHOL/HDL Ratio: 3.3 Ratio
Triglycerides: 216 mg/dL — ABNORMAL HIGH (ref <150)
VLDL: 43 mg/dL — ABNORMAL HIGH (ref 0–40)

## 2014-10-09 ENCOUNTER — Encounter: Payer: Self-pay | Admitting: Family Medicine

## 2014-10-12 ENCOUNTER — Telehealth: Payer: Self-pay | Admitting: Family Medicine

## 2014-10-12 DIAGNOSIS — E119 Type 2 diabetes mellitus without complications: Secondary | ICD-10-CM

## 2014-10-12 DIAGNOSIS — Z794 Long term (current) use of insulin: Secondary | ICD-10-CM

## 2014-10-12 NOTE — Telephone Encounter (Signed)
Pt called because she needs an updated prescription sent to [facility] for her Novolog, Her old prescription was 5 units three times a day now she is doing 15 units three times a day. Can we send this in today. jw

## 2014-10-13 ENCOUNTER — Telehealth: Payer: Self-pay | Admitting: Family Medicine

## 2014-10-13 MED ORDER — INSULIN LISPRO 100 UNIT/ML (KWIKPEN): 15.0000 [IU] | PEN_INJECTOR | Freq: Three times a day (TID) | SUBCUTANEOUS | Status: DC

## 2014-10-13 MED ORDER — INSULIN ASPART 100 UNIT/ML CARTRIDGE (PENFILL): 15.0000 [IU] | Freq: Three times a day (TID) | SUBCUTANEOUS | Status: DC

## 2014-10-13 NOTE — Telephone Encounter (Signed)
Checking status, will be out after today

## 2014-10-13 NOTE — Telephone Encounter (Signed)
Pt informed that rx was sent in @ 2:54 today.  She will wait 68mins and give pharmacy a call back. Herbert Marken, Salome Spotted

## 2014-10-13 NOTE — Telephone Encounter (Signed)
Made switch.

## 2014-10-13 NOTE — Telephone Encounter (Signed)
Pt called because the insurance will not cover her Novolog ant longer. She has to have Humalog Quick Pen and prescribed to use 15 units three times a day. jw

## 2014-10-19 ENCOUNTER — Telehealth: Payer: Self-pay | Admitting: *Deleted

## 2014-10-19 NOTE — Telephone Encounter (Signed)
Patient calls nurse line today with questions about taking her diabetes medication. States since Dr. Andria Frames started her on metformin her blood sugars have come down and she is tolerated the medication very well. She states this morning her FBS was 110, she was questioning whether she should take her insulin with this number. Precepted with Dr. Denna Haggard who states 110 was a good number and to continue all medications as prescribed. Patient informed and expressed understanding, patient states that she would like Dr. Andria Frames to give her a call on her cell whenever he gets a chance. Will forward to MD.

## 2014-10-19 NOTE — Telephone Encounter (Signed)
Called and reaffirmed that she should stay on the current doses of insulin.

## 2014-10-25 ENCOUNTER — Telehealth: Payer: Self-pay | Admitting: *Deleted

## 2014-10-25 DIAGNOSIS — E119 Type 2 diabetes mellitus without complications: Secondary | ICD-10-CM

## 2014-10-25 DIAGNOSIS — Z794 Long term (current) use of insulin: Secondary | ICD-10-CM

## 2014-10-25 MED ORDER — METFORMIN HCL 500 MG PO TABS: 500.0000 mg | ORAL_TABLET | Freq: Two times a day (BID) | ORAL | Status: DC

## 2014-10-25 NOTE — Telephone Encounter (Signed)
Received a fax from [facility] stating that pt' informed them she should be taking one tablet twice daily of metformin.  If this is correct please send new Rx for metformin to apply to insurance.  Derl Barrow, RN

## 2014-10-25 NOTE — Telephone Encounter (Signed)
Done

## 2014-11-02 ENCOUNTER — Other Ambulatory Visit: Payer: Self-pay | Admitting: Family Medicine

## 2014-11-22 ENCOUNTER — Ambulatory Visit (INDEPENDENT_AMBULATORY_CARE_PROVIDER_SITE_OTHER): Payer: Medicare Other | Admitting: Family Medicine

## 2014-11-22 ENCOUNTER — Encounter: Payer: Self-pay | Admitting: Family Medicine

## 2014-11-22 VITALS — BP 128/71 | HR 97 | Temp 98.7°F | Ht 61.0 in | Wt 186.6 lb

## 2014-11-22 DIAGNOSIS — R42 Dizziness and giddiness: Secondary | ICD-10-CM | POA: Diagnosis not present

## 2014-11-22 DIAGNOSIS — N760 Acute vaginitis: Secondary | ICD-10-CM | POA: Diagnosis not present

## 2014-11-22 DIAGNOSIS — E119 Type 2 diabetes mellitus without complications: Secondary | ICD-10-CM

## 2014-11-22 DIAGNOSIS — R309 Painful micturition, unspecified: Secondary | ICD-10-CM

## 2014-11-22 DIAGNOSIS — N3000 Acute cystitis without hematuria: Secondary | ICD-10-CM

## 2014-11-22 DIAGNOSIS — Z794 Long term (current) use of insulin: Secondary | ICD-10-CM

## 2014-11-22 LAB — POCT UA - MICROSCOPIC ONLY

## 2014-11-22 LAB — POCT URINALYSIS DIPSTICK
Bilirubin, UA: NEGATIVE
Glucose, UA: NEGATIVE
Ketones, UA: NEGATIVE
Nitrite, UA: NEGATIVE
Protein, UA: NEGATIVE
Spec Grav, UA: 1.01
Urobilinogen, UA: 0.2
pH, UA: 7

## 2014-11-22 MED ORDER — CEPHALEXIN 500 MG PO CAPS: 500.0000 mg | ORAL_CAPSULE | Freq: Two times a day (BID) | ORAL | Status: DC

## 2014-11-22 MED ORDER — FLUCONAZOLE 150 MG PO TABS: 150.0000 mg | ORAL_TABLET | Freq: Every day | ORAL | Status: DC

## 2014-11-22 NOTE — Patient Instructions (Signed)
You have a bladder infection (less serious than a kidney infection) I sent in two prescriptions - one for antibiotics and one for yeast. Let me know if the infections don't clear up. I am delighted by your weight and your blood sugar control.Marland Kitchen

## 2014-11-23 NOTE — Assessment & Plan Note (Signed)
I anticipate marked improvement in next A1C based on home BS.

## 2014-11-23 NOTE — Progress Notes (Signed)
   Subjective:    Patient ID: [name], female    DOB: 1941/03/16, 74 y.o.   MRN: [id number]  HPI Several issues: 1. Home BS markedly improved with addition of low dose metformin.  Not yet due for A1C.  On her own, she has stopped her mealtime insulin coverage. 2. Recurrent vertigo. Seems positional - worse with lying down and sometimes with head movements.  Had in the remote past and told Meniere's disease.  Now seems to be flairing again. 3. Urgency, frequency and dysuria of 3 days duration.  No fever or flank pain 4. Feels she also has a yeast vag infection.  Gets frequently with her incontinence.    Review of Systems     Objective:   Physical Exam Lungs clear. TMs normal Abd benign, mild suprapubic tenderness Ext normal Neuro, grossly normal gait.  Neg Rhomberg       Assessment & Plan:

## 2014-11-23 NOTE — Assessment & Plan Note (Signed)
Keflex x 3 days for uncomplicated UTI

## 2014-11-23 NOTE — Assessment & Plan Note (Signed)
Seems more like BPPV.  Will refer to PT for otolith repositioning.

## 2014-11-23 NOTE — Assessment & Plan Note (Signed)
Will treat empirically for yeast

## 2014-11-28 ENCOUNTER — Telehealth: Payer: Self-pay | Admitting: Family Medicine

## 2014-11-28 DIAGNOSIS — N3000 Acute cystitis without hematuria: Secondary | ICD-10-CM

## 2014-11-28 LAB — HM DIABETES EYE EXAM

## 2014-11-28 NOTE — Telephone Encounter (Signed)
Pt called because after taking all her medication for her UTI it cleared for a day but now it is back. She wanted to know can she have a refill or something else called in. jw

## 2014-11-28 NOTE — Assessment & Plan Note (Signed)
I will want to check a UA and, if it is suspicious for UTI, a urine culture before prescribing more antibiotics.  I have entered a future order for this lab work.

## 2014-11-28 NOTE — Telephone Encounter (Signed)
LM for patient with husband for patient to call back.  Please have her make a lab appt to recheck her urine. Jazmin Hartsell,CMA

## 2014-11-28 NOTE — Telephone Encounter (Signed)
Will forward to pcp to see if patient needs to be re-evaluated or if something can be called in for her return of symptoms. Linzee Depaul,CMA

## 2014-11-29 ENCOUNTER — Other Ambulatory Visit (INDEPENDENT_AMBULATORY_CARE_PROVIDER_SITE_OTHER): Payer: Medicare Other

## 2014-11-29 DIAGNOSIS — N3000 Acute cystitis without hematuria: Secondary | ICD-10-CM

## 2014-11-29 LAB — POCT URINALYSIS DIPSTICK
Bilirubin, UA: NEGATIVE
Glucose, UA: NEGATIVE
Ketones, UA: NEGATIVE
Nitrite, UA: POSITIVE
Spec Grav, UA: 1.01
Urobilinogen, UA: 0.2
pH, UA: 6

## 2014-11-29 LAB — POCT UA - MICROSCOPIC ONLY

## 2014-11-29 NOTE — Progress Notes (Signed)
Pt returned for repeat urine, CULTURE sent

## 2014-11-30 ENCOUNTER — Telehealth: Payer: Self-pay | Admitting: Family Medicine

## 2014-11-30 NOTE — Telephone Encounter (Signed)
Pt called back again and would like to speak to Clarise Cruz about what's going on. jw

## 2014-12-01 NOTE — Telephone Encounter (Signed)
Please call [name] back regarding pain she's still having from the UTI.  Culture not back yet and per Horris Latino probably won't be until Monday.

## 2014-12-02 LAB — URINE CULTURE: Colony Count: >=100000

## 2014-12-04 ENCOUNTER — Other Ambulatory Visit: Payer: Self-pay | Admitting: Family Medicine

## 2014-12-04 DIAGNOSIS — N3001 Acute cystitis with hematuria: Secondary | ICD-10-CM

## 2014-12-04 DIAGNOSIS — N3000 Acute cystitis without hematuria: Secondary | ICD-10-CM

## 2014-12-04 MED ORDER — CEPHALEXIN 500 MG PO CAPS: 500.0000 mg | ORAL_CAPSULE | Freq: Two times a day (BID) | ORAL | Status: DC

## 2014-12-04 MED ORDER — FLUCONAZOLE 150 MG PO TABS: 150.0000 mg | ORAL_TABLET | Freq: Every day | ORAL | Status: DC

## 2014-12-04 NOTE — Assessment & Plan Note (Signed)
Culture shows pan sensitive E coli.  Treat again with Keflex - longer this time.

## 2014-12-04 NOTE — Telephone Encounter (Signed)
I have the sensitivities, have called [name] and prescribed antibiotics.

## 2014-12-06 ENCOUNTER — Other Ambulatory Visit: Payer: Self-pay

## 2014-12-06 DIAGNOSIS — Z1231 Encounter for screening mammogram for malignant neoplasm of breast: Secondary | ICD-10-CM

## 2014-12-22 ENCOUNTER — Ambulatory Visit (INDEPENDENT_AMBULATORY_CARE_PROVIDER_SITE_OTHER): Payer: Medicare Other | Admitting: Family Medicine

## 2014-12-22 ENCOUNTER — Encounter: Payer: Self-pay | Admitting: Family Medicine

## 2014-12-22 VITALS — BP 135/68 | HR 83 | Temp 98.0°F | Ht 61.0 in | Wt 185.8 lb

## 2014-12-22 DIAGNOSIS — Z794 Long term (current) use of insulin: Secondary | ICD-10-CM | POA: Diagnosis not present

## 2014-12-22 DIAGNOSIS — E119 Type 2 diabetes mellitus without complications: Secondary | ICD-10-CM

## 2014-12-22 DIAGNOSIS — F4323 Adjustment disorder with mixed anxiety and depressed mood: Secondary | ICD-10-CM

## 2014-12-22 DIAGNOSIS — I10 Essential (primary) hypertension: Secondary | ICD-10-CM

## 2014-12-22 LAB — POCT GLYCOSYLATED HEMOGLOBIN (HGB A1C): Hemoglobin A1C: 7.7

## 2014-12-22 NOTE — Assessment & Plan Note (Signed)
Well controled. 

## 2014-12-22 NOTE — Patient Instructions (Signed)
I am very impressed with your weight loss and diabetes control A1C=7.7 The hospice that I have had the best luck with is [facility] [place].  See me in three months.  Sooner if I can help.

## 2014-12-22 NOTE — Assessment & Plan Note (Signed)
No meds for now.  She knows I am available for support.

## 2014-12-22 NOTE — Assessment & Plan Note (Signed)
Much improved on low dose metformin.  Continue weight loss

## 2014-12-22 NOTE — Progress Notes (Signed)
   Subjective:    Patient ID: [name], female    DOB: 03-08-1941, 74 y.o.   MRN: [id number]  HPI Life is full of both good and bad news.  On the good side. Great response to adding metformin.  A1C improved on less insulin and wt is down.  She is working hard at slow wt loss Her husband is in remission from his lymphoma.  Bad news: Daughter in law dying of metastatic cancer.  On hospice receiving large doses on pain meds.  [name] is understandably sad.  Does not want antidepressant meds.  No SI or HI.  Review of Systems     Objective:   Physical Exam Lungs clear Cardiac RRR without m or g       Assessment & Plan:

## 2014-12-28 ENCOUNTER — Ambulatory Visit: Payer: Medicare Other

## 2015-01-02 ENCOUNTER — Other Ambulatory Visit: Payer: Self-pay | Admitting: Family Medicine

## 2015-01-04 ENCOUNTER — Ambulatory Visit
Admission: RE | Admit: 2015-01-04 | Discharge: 2015-01-04 | Disposition: A | Discharge status: Home / Self Care | Payer: Medicare Other | Source: Ambulatory Visit

## 2015-01-04 ENCOUNTER — Inpatient Hospital Stay: Admission: RE | Admit: 2015-01-04 | Payer: Medicare Other | Source: Ambulatory Visit

## 2015-01-04 DIAGNOSIS — Z1231 Encounter for screening mammogram for malignant neoplasm of breast: Secondary | ICD-10-CM

## 2015-01-30 ENCOUNTER — Telehealth: Payer: Self-pay | Admitting: Family Medicine

## 2015-01-30 NOTE — Telephone Encounter (Signed)
[name] is calling because she was instructed to take 1.5 pills of her metformin medication if needed (twice daily). She has been doing this since June 16th, however, her sugar is still running high. She would like to speak to a member of the clinical staff or the PCP to see what further steps she should take. If you cannot reach her at her home number, you may try her mobile number, both of which I have verified. Thank you, [name], ASA

## 2015-01-31 NOTE — Telephone Encounter (Signed)
Called.  FBS running in the 150 range.  She has been intolerant of high dose metformin in the past.   Will leave on current dose until next A1C check.

## 2015-02-05 ENCOUNTER — Other Ambulatory Visit: Payer: Self-pay

## 2015-02-14 ENCOUNTER — Telehealth: Payer: Self-pay | Admitting: Family Medicine

## 2015-02-14 NOTE — Telephone Encounter (Signed)
Pt states that paperwork pertaining to her meds that was refaxed to our office because there was a few missing parts signed. Patient wanted to make sure that Dr. Andria Frames got it and to make sure it was faxed back to the company so patient can get her meds before she runs out. Pls advise.

## 2015-02-15 NOTE — Telephone Encounter (Signed)
I have not seen the paperwork in my box.  I will complete as soon as it comes in.

## 2015-02-21 NOTE — Telephone Encounter (Signed)
Spoke with pt. Stated she has taken care of paperwork. No longer needs our assistance. Ottis Stain, CMA

## 2015-03-19 ENCOUNTER — Other Ambulatory Visit: Payer: Self-pay | Admitting: Family Medicine

## 2015-03-19 NOTE — Telephone Encounter (Signed)
[name] out of her Lantus Solstar pen.  Don't have anything to take for tomorrow.  Could someone else fill the rx for her?

## 2015-03-28 ENCOUNTER — Telehealth: Payer: Self-pay | Admitting: *Deleted

## 2015-03-28 NOTE — Telephone Encounter (Signed)
Received a fax from [employer] stating that patient states Metformin dosage was changed to 1.5 tabs twice daily.  Verbal order given to [employer] for Metformin 500 mg tab; 1.5 tab twice a daily with a meal #240 per Dr. Andria Frames. Derl Barrow, RN

## 2015-04-04 ENCOUNTER — Other Ambulatory Visit: Payer: Self-pay | Admitting: Family Medicine

## 2015-04-20 ENCOUNTER — Telehealth: Payer: Self-pay | Admitting: Family Medicine

## 2015-04-20 DIAGNOSIS — E119 Type 2 diabetes mellitus without complications: Secondary | ICD-10-CM

## 2015-04-20 DIAGNOSIS — Z794 Long term (current) use of insulin: Secondary | ICD-10-CM

## 2015-04-20 MED ORDER — METFORMIN HCL 500 MG PO TABS: 1000.0000 mg | ORAL_TABLET | Freq: Two times a day (BID) | ORAL | Status: DC

## 2015-04-20 NOTE — Telephone Encounter (Signed)
Called.  Will try increase metformin.  She has an appointment to see me in two weeks.

## 2015-04-20 NOTE — Telephone Encounter (Signed)
[name] is having issues with her glucose level spiking again.  Her fasting glucose this am was 175.  Currently taking 750 mg of her metformin twice daily.  Need to speak with you about increasing the dosage.  Please call her today when time allow.  Can reach her at [phone], her cell if you don't reach her on her home phone.

## 2015-05-02 ENCOUNTER — Encounter: Payer: Self-pay | Admitting: Family Medicine

## 2015-05-02 ENCOUNTER — Ambulatory Visit (INDEPENDENT_AMBULATORY_CARE_PROVIDER_SITE_OTHER): Payer: Medicare Other | Admitting: Family Medicine

## 2015-05-02 VITALS — BP 136/59 | HR 86 | Temp 98.7°F | Ht 61.0 in | Wt 188.0 lb

## 2015-05-02 DIAGNOSIS — I10 Essential (primary) hypertension: Secondary | ICD-10-CM | POA: Diagnosis not present

## 2015-05-02 DIAGNOSIS — R413 Other amnesia: Secondary | ICD-10-CM

## 2015-05-02 DIAGNOSIS — E119 Type 2 diabetes mellitus without complications: Secondary | ICD-10-CM | POA: Diagnosis not present

## 2015-05-02 DIAGNOSIS — Z23 Encounter for immunization: Secondary | ICD-10-CM

## 2015-05-02 DIAGNOSIS — E7800 Pure hypercholesterolemia, unspecified: Secondary | ICD-10-CM

## 2015-05-02 DIAGNOSIS — Z794 Long term (current) use of insulin: Secondary | ICD-10-CM | POA: Diagnosis not present

## 2015-05-02 LAB — POCT GLYCOSYLATED HEMOGLOBIN (HGB A1C): Hemoglobin A1C: 9

## 2015-05-02 MED ORDER — SIMVASTATIN 20 MG PO TABS: 10.0000 mg | ORAL_TABLET | Freq: Every day | ORAL | Status: DC

## 2015-05-02 NOTE — Progress Notes (Signed)
   Subjective:    Patient ID: [name], female    DOB: [birth year], 74 y.o.   MRN: [id number]  HPI Several issues Main one is DM.  Clearly less well controled than previously based on home blood sugars.  She called 2 weeks ago and we increased her metformin dose to max.  Some improvement since. Also admits to dietary indiscretion.   Big focus was her lack of exercise.  She states she has problems because of back, hip and knee pain.  Discussed alternatives.   Memory loss.  She wants to try decrease statin due to association between statins and dementia.  Reviewed last lipid panel with excellent LDL    Review of Systems     Objective:   Physical ExamLungs clear Cardiac RRR without m or g Diabetic foot exam done.        Assessment & Plan:

## 2015-05-02 NOTE — Assessment & Plan Note (Signed)
Increase exercise and give higher dose metformen a chance.

## 2015-05-02 NOTE — Patient Instructions (Signed)
Because of your memory problems: 1. Cut the simvastatin in half =10 mg per day 2. Stop the cyclobenzaprene/flexeril 3. OK to take two baclofen at bedtime if you need.   Exercise more. I will not change your insulin dose now, let's give exercise and the higher dose of metformin a chance to work.  Work on diet. If your A1C is not better in three months, I will add mealtime insulin coverage.  Flu shot today. Schedule your colonoscopy.

## 2015-05-02 NOTE — Assessment & Plan Note (Signed)
Given memory issues, DC flexeril which is a [employer]

## 2015-05-02 NOTE — Assessment & Plan Note (Signed)
Good control, no change. 

## 2015-05-02 NOTE — Assessment & Plan Note (Signed)
Sounds mild.  No work up for now.  DC flexeril and decrease statin.  Monitor.

## 2015-05-03 DIAGNOSIS — Z23 Encounter for immunization: Secondary | ICD-10-CM | POA: Diagnosis not present

## 2015-06-01 ENCOUNTER — Ambulatory Visit (INDEPENDENT_AMBULATORY_CARE_PROVIDER_SITE_OTHER): Payer: Medicare Other | Admitting: Family Medicine

## 2015-06-01 ENCOUNTER — Ambulatory Visit (HOSPITAL_COMMUNITY)
Admission: RE | Admit: 2015-06-01 | Discharge: 2015-06-01 | Disposition: A | Discharge status: Home / Self Care | Payer: Medicare Other | Source: Ambulatory Visit | Attending: Family Medicine | Admitting: Family Medicine

## 2015-06-01 VITALS — BP 120/75 | HR 62 | Temp 98.1°F | Wt 188.2 lb

## 2015-06-01 DIAGNOSIS — M25532 Pain in left wrist: Secondary | ICD-10-CM | POA: Insufficient documentation

## 2015-06-01 DIAGNOSIS — S46911A Strain of unspecified muscle, fascia and tendon at shoulder and upper arm level, right arm, initial encounter: Secondary | ICD-10-CM

## 2015-06-01 DIAGNOSIS — S4351XA Sprain of right acromioclavicular joint, initial encounter: Secondary | ICD-10-CM | POA: Diagnosis not present

## 2015-06-01 DIAGNOSIS — M7050 Other bursitis of knee, unspecified knee: Secondary | ICD-10-CM

## 2015-06-01 DIAGNOSIS — M7150 Other bursitis, not elsewhere classified, unspecified site: Secondary | ICD-10-CM | POA: Diagnosis not present

## 2015-06-01 NOTE — Patient Instructions (Signed)
It was a pleasure seeing you today in our clinic. Today we discussed your recent fall. Here is the treatment plan we have discussed and agreed upon together:   - Take Tylenol for pain relief. - Ice these affected areas 2-3 times a day for 15-20 minutes at a time. - Pickup in Ace bandage from [employer]. Use this to wrap her left wrist and thumb to help with the swelling and stability. - I have placed orders for x-rays of your left hand to be taken. If a fracture is noted and we would need to have you back to immobilize your left wrist for 2-3 weeks.

## 2015-06-01 NOTE — Progress Notes (Signed)
HPI  CC: Fall/injuries Patient is here for same-day appointment after falling at the [facility] yesterday. She states that there've been a hole in the asphalt which she tripped over and fell onto her right side. She denies any impact to her head. She reports that her left hand helped to catch her but is now the most painful injury she sustained. She denies any significant bleeding, dizziness, lightheadedness, diaphoresis, loss of consciousness, or confusion. Fall was witnessed by her husband. She states that she was able to walk away from this injury but is concerned about the swelling in her hand and the pain in her right shoulder and knee.  ROS: As above  Objective: BP 120/75 mmHg  Pulse 62  Temp(Src) 98.1 F (36.7 C) (Oral)  Wt 188 lb 3.2 oz (85.367 kg) Gen: NAD, alert, cooperative, and pleasant. Left Wrist: Edema and ecchymoses noted over the thenar eminence. Ecchymosis extends proximally through the carpal tunnel. ROM limited with wrist flexion and thenar adduction. Otherwise full. Palpation is normal over metacarpals. Tenderness over the snuffbox; tendons without tenderness Strength 5/5 in all directions without pain. Negative Finkelstein Right Shoulder: Inspection reveals no abnormalities, atrophy or asymmetry. Tenderness to palpation over the [facility] joint and minimal tenderness over the bicipital groove. ROM is full in all planes. Rotator cuff strength normal throughout. Negative Hawkin's test, negative empty can. Speeds and Yergason's tests normal. Right Knee: Abrasion noted over the patella. No evidence of effusion. Some evidence of osteophyte development at the joint lines. Mild medial joint line tenderness. No patellar tenderness. Significant tenderness over the pes anserine bursa. ROM normal in flexion and extension and lower leg rotation. Ligaments with solid consistent endpoints including ACL, PCL, LCL, MCL. Negative Mcmurray's Non painful patellar  compression. Patellar and quadriceps tendons unremarkable. Hamstring and quadriceps strength is normal. Neuro: Alert and oriented, Speech clear, No gross deficits  Assessment and plan:  Wrist pain, acute After patient's fall injury and today's exam injury appears to be most consistent with wrist sprain versus scaphoid fracture. Patient's pain control seems to be adequate.  - Encourage frequent ice to the affected area 2-3 times every day. - Supplied with ace wrap - Tylenol for pain control - X-ray left wrist with scaphoid view - Follow-up Monday to go over radiographs. If fracture is noted than will likely need 2-4 weeks of immobilization. And follow-up radiographs to assess for nonunion versus AVN.  Strain of AC joint Patient's pain and exam most consistent with mild sprain of the AC joint. Tenderness noted to palpation at this site. No crepitus or decreased range of motion noted. Minimal discomfort with crossarm maneuver. - Regular Ice to this region, to 3 times daily - Tylenol for pain - Encouraged to rest joint for 2-4 days, but otherwise to continue regular use of this joint (risk of adhesive capsulitis) - If symptoms persist strong consideration towards radiographs to assess clavicular stability and AC joint integrity at that time.  Pes anserine bursitis Patient's signs symptoms and physical most consistent with pes anserine bursitis secondary to trauma. There is no evidence of structural damage at this time. No indication for further imaging or workup. - Ice this region regularly, 2 to 3 times daily - Tylenol for pain - Follow-up if pain persists.    Orders Placed This Encounter  Procedures  . DG Wrist Complete Left    Include Scaphoid view    Standing Status: Future     Number of Occurrences: 1     Standing Expiration  Date: 07/31/2016    Order Specific Question:  Reason for Exam (SYMPTOM  OR DIAGNOSIS REQUIRED)    Answer:  wrist trauma    Order Specific Question:   Preferred imaging location?    Answer:  [facility]    Elberta Leatherwood, MD,MS,  PGY2 06/02/2015 12:47 PM

## 2015-06-02 DIAGNOSIS — M25539 Pain in unspecified wrist: Secondary | ICD-10-CM | POA: Insufficient documentation

## 2015-06-02 DIAGNOSIS — S46919A Strain of unspecified muscle, fascia and tendon at shoulder and upper arm level, unspecified arm, initial encounter: Secondary | ICD-10-CM | POA: Insufficient documentation

## 2015-06-02 DIAGNOSIS — M7050 Other bursitis of knee, unspecified knee: Secondary | ICD-10-CM | POA: Insufficient documentation

## 2015-06-02 NOTE — Assessment & Plan Note (Signed)
Patient's pain and exam most consistent with mild sprain of the [facility] joint. Tenderness noted to palpation at this site. No crepitus or decreased range of motion noted. Minimal discomfort with crossarm maneuver. - Regular Ice to this region, to 3 times daily - Tylenol for pain - Encouraged to rest joint for 2-4 days, but otherwise to continue regular use of this joint (risk of adhesive capsulitis) - If symptoms persist strong consideration towards radiographs to assess clavicular stability and AC joint integrity at that time.

## 2015-06-02 NOTE — Assessment & Plan Note (Signed)
Patient's signs symptoms and physical most consistent with pes anserine bursitis secondary to trauma. There is no evidence of structural damage at this time. No indication for further imaging or workup. - Ice this region regularly, 2 to 3 times daily - Tylenol for pain - Follow-up if pain persists.

## 2015-06-02 NOTE — Assessment & Plan Note (Signed)
After patient's fall injury and today's exam injury appears to be most consistent with wrist sprain versus scaphoid fracture. Patient's pain control seems to be adequate.  - Encourage frequent ice to the affected area 2-3 times every day. - Supplied with ace wrap - Tylenol for pain control - X-ray left wrist with scaphoid view - Follow-up Monday to go over radiographs. If fracture is noted than will likely need 2-4 weeks of immobilization. And follow-up radiographs to assess for nonunion versus AVN.

## 2015-06-04 ENCOUNTER — Encounter: Payer: Self-pay | Admitting: Family Medicine

## 2015-06-04 ENCOUNTER — Ambulatory Visit (INDEPENDENT_AMBULATORY_CARE_PROVIDER_SITE_OTHER): Payer: Medicare Other | Admitting: Family Medicine

## 2015-06-04 VITALS — BP 110/70 | HR 84 | Temp 98.0°F | Ht 61.0 in | Wt 186.0 lb

## 2015-06-04 DIAGNOSIS — M25532 Pain in left wrist: Secondary | ICD-10-CM

## 2015-06-04 DIAGNOSIS — M7050 Other bursitis of knee, unspecified knee: Secondary | ICD-10-CM

## 2015-06-04 DIAGNOSIS — M7150 Other bursitis, not elsewhere classified, unspecified site: Secondary | ICD-10-CM

## 2015-06-04 DIAGNOSIS — S4351XA Sprain of right acromioclavicular joint, initial encounter: Secondary | ICD-10-CM

## 2015-06-04 DIAGNOSIS — S46911A Strain of unspecified muscle, fascia and tendon at shoulder and upper arm level, right arm, initial encounter: Secondary | ICD-10-CM

## 2015-06-04 NOTE — Assessment & Plan Note (Signed)
Significant improvement at this time. Continue icing and Tylenol for pain/discomfort. Will likely resolve over the next 10-14 days.

## 2015-06-04 NOTE — Assessment & Plan Note (Signed)
Symptoms have resolved completely at this time

## 2015-06-04 NOTE — Progress Notes (Signed)
   HPI  CC: X-ray follow-up Patient is here for a follow-up on her x-rays which were obtained just prior to the weekend. X-rays were of the left wrist she injured late last week when falling in the parking lot secondary to a hole in the asphalt. Patient states that her pain is significantly improved since her last visit. She states that swelling has also gone down with the compressive Ace wrap. Ecchymoses has slightly worsened but is since stable. The pain in her right shoulder and right knee are significantly improved and almost resolved. He states that she is no longer limping due to the pain in that right knee. Patient is interested in the results from the x-ray.  ROS: Patient denies any fever, chills, diaphoresis, lightheadedness, headache, shortness of breath, chest pain, nausea, vomiting, diarrhea, weakness, numbness, paresthesias, edema.  Objective: BP 110/70 mmHg  Pulse 84  Temp(Src) 98 F (36.7 C) (Oral)  Ht 5\' 1"  (1.549 m)  Wt 186 lb (84.369 kg)  BMI 35.16 kg/m2  SpO2 94% Gen: NAD, alert, cooperative, and pleasant. Left Wrist: Edema improved. Ecchymoses noted over the thenar eminence (slightly worse than prior) extending proximally through the carpal tunnel. ROM mildly limited with wrist flexion and thenar adduction. Otherwise full. Palpation is normal over metacarpals. Minimal tenderness over the snuffbox; tendons without tenderness Strength 5/5 in all directions without pain. Negative Finklestein Ext: No edema, warm, right shoulder with full range of motion and resolved pain. Right shoulder with full range of motion and mostly resolved pain along the medial aspect of the tibia. Abrasion healing well.  Neuro: Alert and oriented, Speech clear, No gross deficits   XR Wrist, Left: Diffuse degenerative change. No acute abnormality.    Assessment and plan:  Wrist pain, acute Patient's x-rays showed no evidence of bony compromise/fracture. Patient's symptoms appear to be  improving dramatically since the injury. At this time I do not believe it is appropriate for immobilization of patient's wrist. - Encourage continued icing of the wrist. - Encouraged Tylenol for pain control. - I gave the patient the option to discontinue using the Ace bandage for compression. However, patient stated that she felt more comfortable continuing its use for now. Patient will continue this use for 1 week. - Avoid heavy lifting or activities involving high-impact stress (ie. Tennis) for 2-4 weeks. - Allow pain to be her guide moving forward - F/u PRN  Strain of AC joint Symptoms have resolved completely at this time  Pes anserine bursitis Significant improvement at this time. Continue icing and Tylenol for pain/discomfort. Will likely resolve over the next 10-14 days.    Elberta Leatherwood, MD,MS,  PGY2 06/04/2015 7:30 PM

## 2015-06-04 NOTE — Assessment & Plan Note (Signed)
Patient's x-rays showed no evidence of bony compromise/fracture. Patient's symptoms appear to be improving dramatically since the injury. At this time I do not believe it is appropriate for immobilization of patient's wrist. - Encourage continued icing of the wrist. - Encouraged Tylenol for pain control. - I gave the patient the option to discontinue using the Ace bandage for compression. However, patient stated that she felt more comfortable continuing its use for now. Patient will continue this use for 1 week. - Avoid heavy lifting or activities involving high-impact stress (ie. Tennis) for 2-4 weeks. - Allow pain to be her guide moving forward - F/u PRN

## 2015-06-15 ENCOUNTER — Other Ambulatory Visit: Payer: Self-pay | Admitting: Orthopedic Surgery

## 2015-06-15 DIAGNOSIS — M545 Low back pain: Secondary | ICD-10-CM

## 2015-06-27 ENCOUNTER — Encounter: Payer: Self-pay | Admitting: Family Medicine

## 2015-06-27 ENCOUNTER — Ambulatory Visit (INDEPENDENT_AMBULATORY_CARE_PROVIDER_SITE_OTHER): Payer: Medicare Other | Admitting: Family Medicine

## 2015-06-27 VITALS — BP 111/62 | HR 89 | Temp 98.8°F | Ht 61.0 in | Wt 184.4 lb

## 2015-06-27 DIAGNOSIS — R300 Dysuria: Secondary | ICD-10-CM

## 2015-06-27 DIAGNOSIS — N390 Urinary tract infection, site not specified: Secondary | ICD-10-CM | POA: Insufficient documentation

## 2015-06-27 LAB — POCT URINALYSIS DIPSTICK
Bilirubin, UA: NEGATIVE
Glucose, UA: NEGATIVE
Ketones, UA: NEGATIVE
Nitrite, UA: POSITIVE
Protein, UA: 30
Spec Grav, UA: 1.015
Urobilinogen, UA: 0.2
pH, UA: 7

## 2015-06-27 LAB — POCT UA - MICROSCOPIC ONLY

## 2015-06-27 MED ORDER — CEPHALEXIN 500 MG PO CAPS: 500.0000 mg | ORAL_CAPSULE | Freq: Two times a day (BID) | ORAL | Status: DC

## 2015-06-27 NOTE — Patient Instructions (Signed)
I sent in the antibiotic twice a day for seven days.  This should clear you up.  Keep your regular appointment for the A1C Good luck with the MRI.  I have other nerve pain meds we can try.

## 2015-06-28 NOTE — Assessment & Plan Note (Signed)
Sounds like typical lower tract (uncomplicated) infection.  Will treat with BID keflex - but not 3 days.  7 Day Rx because failed 3 day in the past.

## 2015-06-28 NOTE — Progress Notes (Signed)
   Subjective:    Patient ID: [name], female    DOB: Jan 17, 1941, 74 y.o.   MRN: [id number]  HPI Has UTI symptoms for 3 days.  Urgency, frequency and dysuria. No fever, nausea, vomiting or flank pain.  No hx of kidney stones or obstruction.   Review of Systems     Objective:   Physical Exam  Non toxic appearing.  No CVA tenderness. Suprapubic tenderness. UA consistent with UTI.       Assessment & Plan:

## 2015-06-29 ENCOUNTER — Telehealth: Payer: Self-pay | Admitting: Family Medicine

## 2015-06-29 NOTE — Telephone Encounter (Signed)
Pt was dx with a uti and was given medication for it, is now having side effects and wants to speak with RN

## 2015-06-29 NOTE — Telephone Encounter (Signed)
Pt is having stomach cramps and diarrhea with keflex.  Has taken this medication before, with no issue.  Checked with Dr. Andria Frames, he would like for her to continue this medication but to be sure and take with food and get plenty of water.  Message relayed to pt and she is agreeable. Clinton Sawyer, Salome Spotted

## 2015-07-02 ENCOUNTER — Ambulatory Visit
Admission: RE | Admit: 2015-07-02 | Discharge: 2015-07-02 | Disposition: A | Discharge status: Home / Self Care | Payer: Medicare Other | Source: Ambulatory Visit | Attending: Orthopedic Surgery | Admitting: Orthopedic Surgery

## 2015-07-02 DIAGNOSIS — M545 Low back pain: Secondary | ICD-10-CM

## 2015-07-10 ENCOUNTER — Telehealth: Payer: Self-pay | Admitting: Family Medicine

## 2015-07-10 ENCOUNTER — Encounter: Payer: Self-pay | Admitting: Family Medicine

## 2015-07-10 DIAGNOSIS — M48061 Spinal stenosis, lumbar region without neurogenic claudication: Secondary | ICD-10-CM

## 2015-07-10 DIAGNOSIS — I700 Atherosclerosis of aorta: Secondary | ICD-10-CM | POA: Insufficient documentation

## 2015-07-10 NOTE — Telephone Encounter (Signed)
Pt called and would like to speak to Dr. Andria Frames about a couple of things. One is the MRI results and what he thinks. The second is she needs medication but can not take some medications and she is unsure what she is to do. She would feel better talking to Dr. Andria Frames. She will be leaving tomorrow at 11 am for another doctors appointment and would hope that he can call before then. jw

## 2015-07-11 MED ORDER — DULOXETINE HCL 30 MG PO CPEP: 30.0000 mg | ORAL_CAPSULE | Freq: Every day | ORAL | Status: DC

## 2015-07-11 MED ORDER — BENZONATATE 200 MG PO CAPS: 200.0000 mg | ORAL_CAPSULE | Freq: Three times a day (TID) | ORAL | Status: DC | PRN

## 2015-07-11 NOTE — Telephone Encounter (Signed)
Called and discussed

## 2015-07-12 ENCOUNTER — Ambulatory Visit (INDEPENDENT_AMBULATORY_CARE_PROVIDER_SITE_OTHER): Payer: Medicare Other | Admitting: Family Medicine

## 2015-07-12 ENCOUNTER — Encounter: Payer: Self-pay | Admitting: Family Medicine

## 2015-07-12 VITALS — BP 130/80 | HR 113 | Temp 99.0°F | Ht 61.0 in | Wt 183.0 lb

## 2015-07-12 DIAGNOSIS — N390 Urinary tract infection, site not specified: Secondary | ICD-10-CM | POA: Diagnosis not present

## 2015-07-12 DIAGNOSIS — R300 Dysuria: Secondary | ICD-10-CM

## 2015-07-12 LAB — POCT URINALYSIS DIPSTICK
Bilirubin, UA: NEGATIVE
Glucose, UA: NEGATIVE
Ketones, UA: NEGATIVE
Nitrite, UA: NEGATIVE
Protein, UA: 100
Spec Grav, UA: 1.02
Urobilinogen, UA: 0.2
pH, UA: 6

## 2015-07-12 MED ORDER — CEPHALEXIN 500 MG PO CAPS: 500.0000 mg | ORAL_CAPSULE | Freq: Two times a day (BID) | ORAL | Status: DC

## 2015-07-12 NOTE — Assessment & Plan Note (Signed)
UA consistent with UTI. Will treat with course of keflex. Culture sent.

## 2015-07-12 NOTE — Progress Notes (Signed)
    Subjective:  [name] is a 74 y.o. female who presents to the [facility] today for same day appointment with a chief complaint of dysuria.   HPI:  Dysuria Patient presents with dysuria since yesterday. Patient has a history of frequent UTIs and was treated with a week of keflex a month ago for a UTI. Reports that she was symptom free until yesterday. Patient has urinary incontinence, which is at its baseline, however says that coughing more has caused her to be more incontinent the past week, which has led to her UTI. No fevers. No nausea or vomiting. No hematuria. No history of kidney stones.   ROS: Per HPI  Objective:  Physical Exam: BP 130/80 mmHg  Pulse 113  Temp(Src) 99 F (37.2 C) (Oral)  Ht 5\' 1"  (1.549 m)  Wt 183 lb (83.008 kg)  BMI 34.60 kg/m2  SpO2 99%  Gen: NAD, resting comfortably CV: RRR with no murmurs appreciated Lungs: NWOB, CTAB with no crackles, wheezes, or rhonchi GI: Normal bowel sounds present. Soft, Nontender, Nondistended. MSK: no edema, cyanosis, or clubbing noted. No CVA tenderness.  Skin: warm, dry Neuro: grossly normal, moves all extremities Psych: Normal affect and thought content  Results for orders placed or performed in visit on 07/12/15 (from the past 72 hour(s))  POCT urinalysis dipstick     Status: Abnormal   Collection Time: 07/12/15  4:25 PM  Result Value Ref Range   Color, UA YELLOW    Clarity, UA CLEAR    Glucose, UA NEG    Bilirubin, UA NEG    Ketones, UA NEG    Spec Grav, UA 1.020    Blood, UA TRACE-INTACT    pH, UA 6.0    Protein, UA 100    Urobilinogen, UA 0.2    Nitrite, UA NEG    Leukocytes, UA moderate (2+) (A) Negative     Assessment/Plan:  Urinary tract infection, site not specified UA consistent with UTI. Will treat with course of keflex. Culture sent.    Algis Greenhouse. Jerline Pain, [place] Resident PGY-2 07/12/2015 5:14 PM

## 2015-07-12 NOTE — Patient Instructions (Signed)

## 2015-07-13 LAB — URINE CULTURE: Colony Count: 40000

## 2015-07-17 ENCOUNTER — Other Ambulatory Visit: Payer: Self-pay | Admitting: Family Medicine

## 2015-07-17 NOTE — Telephone Encounter (Signed)
Still needs another months supply of insulin. Dr Andria Frames was able to get thru the manufacturer.  He has to request the refill form. Company will fax it to him, dr Andria Frames will fill it out and fax it back.  The form has to be back to company by dec 16.  [phone] is the phone number of the company

## 2015-07-18 ENCOUNTER — Ambulatory Visit: Payer: Medicare Other | Admitting: Physical Therapy

## 2015-07-23 NOTE — Telephone Encounter (Signed)
I have called the company and requested the form for refills to be faxed.  The form needs to be filled out and sent back by the 16th, no refills will be done after the 16th. Ottis Stain, CMA

## 2015-07-25 ENCOUNTER — Telehealth: Payer: Self-pay | Admitting: Family Medicine

## 2015-07-25 NOTE — Telephone Encounter (Signed)
Received call after-hours line from patient for shoulder pain. Patient has had singles in the past and feels like her current pain is similar to past outbreaks. No chest pain or shortness of breath. She has not yet noticed a rash. She has tried taking hydrocodone but is still experiencing pain. Patient called the after-hours line to ask if it was ok to take ibuprofen with her medications. I told her that it would be ok to take ibuprofen, but that she should either come to the clinic tomorrow for evaluation or go to the ED tonight if her pain persisted. Patient voiced understanding with no further questions.   Algis Greenhouse. Jerline Pain, [facility] Resident PGY-2 07/25/2015 11:03 PM

## 2015-07-25 NOTE — Telephone Encounter (Signed)
Form received, completed and faxed to company.

## 2015-07-25 NOTE — Telephone Encounter (Signed)
Called company again and requested they refax the the refill form. They had the wrong fax number. I am handing the form to Dr. Andria Frames. Ottis Stain, CMA

## 2015-07-26 ENCOUNTER — Ambulatory Visit: Payer: Medicare Other | Admitting: Student

## 2015-07-26 ENCOUNTER — Encounter (HOSPITAL_COMMUNITY): Payer: Self-pay | Admitting: Emergency Medicine

## 2015-07-26 ENCOUNTER — Emergency Department (HOSPITAL_COMMUNITY): Payer: Medicare Other

## 2015-07-26 ENCOUNTER — Emergency Department (HOSPITAL_COMMUNITY)
Admission: EM | Admit: 2015-07-26 | Discharge: 2015-07-26 | Disposition: A | Discharge status: Home / Self Care | Payer: Medicare Other | Attending: Emergency Medicine | Admitting: Emergency Medicine

## 2015-07-26 ENCOUNTER — Encounter: Payer: Self-pay | Admitting: Family Medicine

## 2015-07-26 ENCOUNTER — Ambulatory Visit (INDEPENDENT_AMBULATORY_CARE_PROVIDER_SITE_OTHER): Payer: Medicare Other | Admitting: Family Medicine

## 2015-07-26 VITALS — BP 137/61 | HR 71 | Temp 98.2°F | Ht 60.0 in | Wt 184.5 lb

## 2015-07-26 DIAGNOSIS — G8929 Other chronic pain: Secondary | ICD-10-CM | POA: Insufficient documentation

## 2015-07-26 DIAGNOSIS — I10 Essential (primary) hypertension: Secondary | ICD-10-CM | POA: Diagnosis not present

## 2015-07-26 DIAGNOSIS — Z79899 Other long term (current) drug therapy: Secondary | ICD-10-CM | POA: Insufficient documentation

## 2015-07-26 DIAGNOSIS — R220 Localized swelling, mass and lump, head: Secondary | ICD-10-CM | POA: Diagnosis not present

## 2015-07-26 DIAGNOSIS — G43909 Migraine, unspecified, not intractable, without status migrainosus: Secondary | ICD-10-CM | POA: Diagnosis not present

## 2015-07-26 DIAGNOSIS — R0781 Pleurodynia: Secondary | ICD-10-CM | POA: Diagnosis not present

## 2015-07-26 DIAGNOSIS — Z7984 Long term (current) use of oral hypoglycemic drugs: Secondary | ICD-10-CM | POA: Diagnosis not present

## 2015-07-26 DIAGNOSIS — M549 Dorsalgia, unspecified: Secondary | ICD-10-CM | POA: Diagnosis present

## 2015-07-26 DIAGNOSIS — M546 Pain in thoracic spine: Secondary | ICD-10-CM

## 2015-07-26 DIAGNOSIS — Z880 Allergy status to penicillin: Secondary | ICD-10-CM | POA: Diagnosis not present

## 2015-07-26 DIAGNOSIS — Z7982 Long term (current) use of aspirin: Secondary | ICD-10-CM | POA: Diagnosis not present

## 2015-07-26 DIAGNOSIS — M62830 Muscle spasm of back: Secondary | ICD-10-CM | POA: Diagnosis not present

## 2015-07-26 DIAGNOSIS — Z7952 Long term (current) use of systemic steroids: Secondary | ICD-10-CM | POA: Diagnosis not present

## 2015-07-26 DIAGNOSIS — Z87891 Personal history of nicotine dependence: Secondary | ICD-10-CM | POA: Diagnosis not present

## 2015-07-26 DIAGNOSIS — Z794 Long term (current) use of insulin: Secondary | ICD-10-CM | POA: Insufficient documentation

## 2015-07-26 DIAGNOSIS — J45909 Unspecified asthma, uncomplicated: Secondary | ICD-10-CM | POA: Insufficient documentation

## 2015-07-26 LAB — URINALYSIS, ROUTINE W REFLEX MICROSCOPIC
Bilirubin Urine: NEGATIVE
Glucose, UA: NEGATIVE mg/dL
Hgb urine dipstick: NEGATIVE
Ketones, ur: NEGATIVE mg/dL
Leukocytes, UA: NEGATIVE
Nitrite: NEGATIVE
Protein, ur: NEGATIVE mg/dL
Specific Gravity, Urine: 1.023 (ref 1.005–1.030)
pH: 6 (ref 5.0–8.0)

## 2015-07-26 LAB — D-DIMER, QUANTITATIVE (NOT AT ARMC): D-Dimer, Quant: 0.56 ug/mL-FEU — ABNORMAL HIGH (ref 0.00–0.50)

## 2015-07-26 MED ORDER — DICLOFENAC SODIUM 1 % TD GEL: 4.0000 g | Freq: Four times a day (QID) | TRANSDERMAL | Status: DC

## 2015-07-26 MED ORDER — LIDOCAINE 5 % EX PTCH
1.0000 | MEDICATED_PATCH | CUTANEOUS | Status: DC
  Administered 2015-07-26: 1 via TRANSDERMAL
  Filled 2015-07-26 (×2): qty 1

## 2015-07-26 MED ORDER — METHOCARBAMOL 500 MG PO TABS
1000.0000 mg | ORAL_TABLET | Freq: Once | ORAL | Status: AC
  Administered 2015-07-26: 1000 mg via ORAL
  Filled 2015-07-26: qty 2

## 2015-07-26 MED ORDER — KETOROLAC TROMETHAMINE 60 MG/2ML IM SOLN
60.0000 mg | Freq: Once | INTRAMUSCULAR | Status: AC
  Administered 2015-07-26: 60 mg via INTRAMUSCULAR
  Filled 2015-07-26: qty 2

## 2015-07-26 MED ORDER — VALACYCLOVIR HCL 1 G PO TABS: 1000.0000 mg | ORAL_TABLET | Freq: Three times a day (TID) | ORAL | Status: DC

## 2015-07-26 NOTE — ED Notes (Signed)
Pt c/o R sided mid back pain, tender to palpation. Hx of shingles with last episode approx. 2 years ago. Pt has had the shingles vaccine. Pain onset at dinner tonight. Reports constant pain with intermittent sharp shooting pains. Denies chest pain or shortness of breath. Reports pain is same as shingles episode in the past. No rash present at this time

## 2015-07-26 NOTE — Assessment & Plan Note (Signed)
Allergic reaction is most likely cause.  I do not know which of the three meds is the culprit.

## 2015-07-26 NOTE — Progress Notes (Signed)
   Subjective:    Patient ID: [name], female    DOB: 08/28/1940, 74 y.o.   MRN: [id number]  HPI  Patient with severe acute left posterior chest pain.  Went to ER last night.  Told musculoskeletal.  Given ketoralac, robaxin and lidocaine patch.  Unfortunately, this morning has developed lip swelling in a likely allergic reaction. Pain is in the same location as a previous shingles outbreak.  She has had several recurrances of pain in the same location and attributes this episodes to shingles.  She has only had the rash on the one occasion. She also has a pleuritic component to her pain, hurts with deep breath.  No SOB.  She has never had a DVT or PE.  No recent trauma or surgery.  She has had recent right leg pain attributed to her back, no swelling.  She has been less mobile because of the leg and back pain. Stress is high.  She has a lot of pain issues with migraine, back pain, neuropathy pain, etc.  Stress tends to make these worse.      Review of Systems     Objective:   Physical Exam VS normal, no tachycardia or tachypnea Lungs clear.   Pain is posterior thoracic just below the left scapula.  No skin rash.  Skin is hypersensitive and she complains of the pain being deeper.   Legs no edema. Obvious swelling of the left upper lip.        Assessment & Plan:

## 2015-07-26 NOTE — ED Notes (Signed)
Pt called out for pain medication. Dr.Palumbo made aware

## 2015-07-26 NOTE — ED Notes (Signed)
Dr. Palumbo at bedside. 

## 2015-07-26 NOTE — ED Notes (Signed)
Pt given graham crackers.

## 2015-07-26 NOTE — ED Notes (Signed)
Pt. reports pain at right lower scapular area onset this evening, pt. stated pain is similar to her shingles in the past , denies fever or chills , respirations unlabored , skin is clean  and intact .

## 2015-07-26 NOTE — Assessment & Plan Note (Addendum)
Broad diff.  MSK most likely.  Viral pleurisy, PE, recurrent shingles all in the differential.  Will not pursue CT of chest with MINIMALLY eleveated D dimer.  Will rx as shingles because of seeming past response to valcyclovir. Encouraged to fill the voltarin gel prescribed by the ER.  Ice for analgesia.  Also labeled as allergic to three eR meds because of lip swelling.

## 2015-07-26 NOTE — ED Provider Notes (Signed)
CSN: [id number]     Arrival date & time 07/26/15  0109 History  By signing my name below, I, Altamease Oiler, attest that this documentation has been prepared under the direction and in the presence of Addyson Traub, MD. Electronically Signed: Altamease Oiler, ED Scribe. 07/26/2015. 3:23 AM    Chief Complaint  Patient presents with  . Back Pain    Patient is a 74 y.o. female presenting with back pain. The history is provided by the patient. No language interpreter was used.  Back Pain Pain location: upper right back right of spine. Quality:  Aching and stabbing Radiates to:  Does not radiate Pain severity:  Severe Onset quality:  Gradual Duration:  1 day Timing:  Constant Progression:  Unchanged Chronicity:  New Context: not lifting heavy objects, not MCA, not MVA and not twisting   Relieved by:  Nothing Worsened by:  Nothing tried Ineffective treatments:  Narcotics and NSAIDs Associated symptoms: no abdominal pain, no bladder incontinence, no bowel incontinence, no chest pain, no fever, no headaches, no leg pain, no numbness, no paresthesias, no perianal numbness, no tingling and no weakness   Risk factors: no hx of cancer    [name] is a 74 y.o. female who presents to the Emergency Department complaining of atraumatic, aching/stabbing, constant right upper back pain with onset yesterday after dinner. This pain is similar to pain that she had in the past with Shingles. She was started Cymbalta on 07/11/15 and notes that it, Vicodin, and ibuprofen provided insufficient pain relief at home.  Pt denies injury or repetitive movement.  She recently got over a uti.  Pt denies chest pain, SOB, LE pain and swelling No CP no DOE.  No leg swelling. Pt states that she recently started a course of abx for a UTI.   Past Medical History  Diagnosis Date  . Hypertension   . Asthma   . Migraine    Past Surgical History  Procedure Laterality Date  . Appendectomy    . Abdominal  hysterectomy    . Arm surgery     Family History  Problem Relation Age of Onset  . Hearing loss Sister   . Heart attack Sister 68    s/p 4 bypasses   . Stroke Brother     72   Social History  Substance Use Topics  . Smoking status: Former Smoker -- 2.00 packs/day for 33 years    Types: Cigarettes    Start date: 08/11/1957    Quit date: 04/04/1991  . Smokeless tobacco: Never Used  . Alcohol Use: No   OB History    No data available     Review of Systems  Constitutional: Negative for fever.  Respiratory: Negative for cough, chest tightness, shortness of breath and wheezing.   Cardiovascular: Negative for chest pain, palpitations and leg swelling.  Gastrointestinal: Negative for nausea, vomiting, abdominal pain and bowel incontinence.  Genitourinary: Negative for bladder incontinence.  Musculoskeletal: Positive for back pain. Negative for myalgias, arthralgias, gait problem and neck pain.  Skin: Negative for rash.  Neurological: Negative for dizziness, tingling, facial asymmetry, weakness, numbness, headaches and paresthesias.  All other systems reviewed and are negative.    Allergies  Amoxicillin; Azithromycin; Butalbital; Clindamycin hcl; Hydrocodone-acetaminophen; Januvia; Meperidine hcl; Sulfamethoxazole; Macrodantin; Codeine phosphate; Etodolac; Gabapentin; Metformin and related; Naproxen; Oxycodone; Pentazocine-naloxone; Sumatriptan; and Zonegran  Home Medications   Prior to Admission medications   Medication Sig Start Date End Date Taking? Authorizing Provider  albuterol (PROVENTIL HFA;VENTOLIN HFA) 108 (  90 BASE) MCG/ACT inhaler Inhale 2 puffs into the lungs every 6 (six) hours as needed for wheezing or shortness of breath. 04/19/14  Yes Nani Ravens, MD  aspirin 81 MG chewable tablet Chew 81 mg by mouth daily.     Yes Historical Provider, MD  atenolol (TENORMIN) 25 MG tablet TAKE 1 BY MOUTH DAILY 10/04/14  Yes Moses Manners, MD  baclofen (LIORESAL) 10 MG tablet  TAKE 1 BY MOUTH EVERY 6 HOURS AS NEEDED FOR MIGRAINE 08/15/14  Yes Moses Manners, MD  bifidobacterium infantis (ALIGN) capsule Take 1 capsule by mouth 2 (two) times daily.     Yes Historical Provider, MD  DULoxetine (CYMBALTA) 30 MG capsule Take 1 capsule (30 mg total) by mouth daily. 07/11/15  Yes Moses Manners, MD  enalapril (VASOTEC) 10 MG tablet TAKE 1 BY MOUTH DAILY 01/03/15  Yes Moses Manners, MD  famotidine (PEPCID) 40 MG tablet Take 20 mg by mouth daily.    Yes Historical Provider, MD  Insulin Glargine (LANTUS SOLOSTAR) 100 UNIT/ML Solostar Pen Inject 60 Units into the skin daily at 10 pm. Patient taking differently: Inject 60 Units into the skin every morning.  03/19/15  Yes Moses Manners, MD  metFORMIN (GLUCOPHAGE) 500 MG tablet Take 2 tablets (1,000 mg total) by mouth 2 (two) times daily with a meal. 04/20/15  Yes Moses Manners, MD  Multiple Vitamin (MULTIVITAMIN WITH MINERALS) TABS tablet Take 1 tablet by mouth daily.   Yes Historical Provider, MD  Omega-3 Fatty Acids (OMEGA 3 PO) Take 1 capsule by mouth daily.   Yes Historical Provider, MD  simvastatin (ZOCOR) 20 MG tablet Take 0.5 tablets (10 mg total) by mouth daily. 05/02/15  Yes Moses Manners, MD  topiramate (TOPAMAX) 50 MG tablet Take 3 tablets (150 mg total) by mouth at bedtime. 08/23/14  Yes Moses Manners, MD  traMADol (ULTRAM) 50 MG tablet Take 1 tablet (50 mg total) by mouth every 8 (eight) hours as needed. In response to your electronic request. 09/04/14  Yes Moses Manners, MD  vitamin C (ASCORBIC ACID) 250 MG tablet Take 250 mg by mouth daily.   Yes Historical Provider, MD  benzonatate (TESSALON) 200 MG capsule Take 1 capsule (200 mg total) by mouth 3 (three) times daily as needed for cough. Patient not taking: Reported on 07/26/2015 07/11/15   Moses Manners, MD  Blood Glucose Monitoring Suppl (ONE TOUCH ULTRA 2) W/DEVICE KIT Dispense one device.  Diagnosis 250.00 10/17/13   Moses Manners, MD  cephALEXin  (KEFLEX) 500 MG capsule Take 1 capsule (500 mg total) by mouth 2 (two) times daily. Patient not taking: Reported on 07/26/2015 07/12/15   Ardith Dark, MD  fluconazole (DIFLUCAN) 150 MG tablet Take 1 tablet (150 mg total) by mouth daily. Patient not taking: Reported on 07/26/2015 12/04/14   Moses Manners, MD  fluocinonide ointment (LIDEX) 0.05 % Apply topically 2 (two) times daily. Only use on hands and feet Patient not taking: Reported on 07/26/2015 08/18/12   Moses Manners, MD  fluticasone (FLOVENT HFA) 220 MCG/ACT inhaler Inhale 1 puff into the lungs 2 (two) times daily. Patient not taking: Reported on 07/26/2015 09/27/12   Moses Manners, MD  glucose blood (ONE TOUCH ULTRA TEST) test strip 1 each by Other route 3 (three) times daily. Test blood sugar.  Diagnosis code E11.9, Z79.4 07/05/14   Moses Manners, MD  Insulin Pen Needle 32G X 6 MM MISC 57 Units  by Does not apply route once. 10/11/13   Zenia Resides, MD  Lancets Misc. (ONE TOUCH SURESOFT) MISC 1 Device by Does not apply route 3 (three) times daily. Test blood sugar.  Diagnosis code E11.9, Z79.4 07/05/14   Zenia Resides, MD  nortriptyline (PAMELOR) 50 MG capsule TAKE 1 BY MOUTH AT BEDTIME Patient not taking: Reported on 07/11/2015 10/04/14   Zenia Resides, MD  [facility] DELICA LANCETS 10X MISC USE ONE LANCET THREE TIMES DAILY 04/06/15   Dickie La, MD  valACYclovir (VALTREX) 1000 MG tablet Take 1 tablet (1,000 mg total) by mouth 3 (three) times daily. Patient not taking: Reported on 07/26/2015 04/26/14   Bryan R Hess, DO   BP 143/70 mmHg  Pulse 80  Temp(Src) 98 F (36.7 C) (Oral)  Resp 20  Ht 5' (1.524 m)  Wt 187 lb 9.8 oz (85.1 kg)  BMI 36.64 kg/m2  SpO2 95% Physical Exam  Constitutional: She is oriented to person, place, and time. She appears well-developed and well-nourished.  HENT:  Head: Normocephalic.  Mouth/Throat: Oropharynx is clear and moist.  Moist mucous membranes No exudate  Eyes: EOM are normal.  Pupils are equal, round, and reactive to light.  Neck: Normal range of motion. Neck supple.  Trachea midline  Cardiovascular: Normal rate and regular rhythm.   Pulmonary/Chest: Effort normal and breath sounds normal. No respiratory distress. She has no wheezes. She has no rales.  Abdominal: Soft. Bowel sounds are normal. She exhibits no mass. There is no tenderness. There is no rebound and no guarding.  Musculoskeletal: Normal range of motion. She exhibits no edema or tenderness.       Cervical back: Normal.       Thoracic back: Normal.  Muscle spasm in paraspinal region just right of center  Neurological: She is alert and oriented to person, place, and time. She has normal reflexes. No cranial nerve deficit.  Skin: Skin is warm and dry. No rash noted.  Psychiatric: She has a normal mood and affect. Her behavior is normal.  Nursing note and vitals reviewed.   ED Course  Procedures (including critical care time) DIAGNOSTIC STUDIES: Oxygen Saturation is 95% on RA,  normal by my interpretation.    COORDINATION OF CARE: 3:19 AM Discussed treatment plan which includes CXR and UA with pt at bedside and pt agreed to plan.  Labs Review Labs Reviewed  URINALYSIS, ROUTINE W REFLEX MICROSCOPIC (NOT AT [facility])    Imaging Review Dg Chest 2 View  07/26/2015  CLINICAL DATA:  Sharp pain under the right shoulder blade tonight. EXAM: CHEST  2 VIEW COMPARISON:  AC joints 09/17/2010 FINDINGS: The heart size and mediastinal contours are within normal limits. Both lungs are clear. The visualized skeletal structures are unremarkable. IMPRESSION: No active cardiopulmonary disease. Electronically Signed   By: Lucienne Capers M.D.   On: 07/26/2015 03:11   I have personally reviewed and evaluated these images and lab results as part of my medical decision-making.   EKG Interpretation None      MDM   Final diagnoses:  None  No signs of shingles, has a palpable spasm.  No PNA, no bony  abnormalities  Case d/w Dr. Ola Spurr of family medicine.  Call office in am to same day appointment.  Ok to add voltaren gel to patient's current regimen.    Will start voltaren gel in addition to patient's baclofen, ultram and norco.    I personally performed the services described in this documentation, which was  scribed in my presence. The recorded information has been reviewed and is accurate.    Veatrice Kells, MD 07/26/15 [phone]

## 2015-07-26 NOTE — Patient Instructions (Signed)
I have ordered a blood test for a blood clot.  If that test comes back high, you will need to have a chest CT. I refilled the valcyclovir even though there is a less than 50% chance this is the shingles. The lip swelling is an allergic reaction, most likely to the ketorolac, methocarbamal or the lidocaine.  I have labeled your chart. OK to use the voltarin gel.  This is a good, topical pain medication.

## 2015-07-26 NOTE — Discharge Instructions (Signed)
Heat Therapy °Heat therapy can help ease sore, stiff, injured, and tight muscles and joints. Heat relaxes your muscles, which may help ease your pain. Heat therapy should only be used on old, pre-existing, or long-lasting (chronic) injuries. Do not use heat therapy unless told by your doctor. °HOW TO USE HEAT THERAPY °There are several different kinds of heat therapy, including: °· Moist heat pack. °· Warm water bath. °· Hot water bottle. °· Electric heating pad. °· Heated gel pack. °· Heated wrap. °· Electric heating pad. °GENERAL HEAT THERAPY RECOMMENDATIONS  °· Do not sleep while using heat therapy. Only use heat therapy while you are awake. °· Your skin may turn pink while using heat therapy. Do not use heat therapy if your skin turns red. °· Do not use heat therapy if you have new pain. °· High heat or long exposure to heat can cause burns. Be careful when using heat therapy to avoid burning your skin. °· Do not use heat therapy on areas of your skin that are already irritated, such as with a rash or sunburn. °GET HELP IF:  °· You have blisters, redness, swelling (puffiness), or numbness. °· You have new pain. °· Your pain is worse. °MAKE SURE YOU: °· Understand these instructions. °· Will watch your condition. °· Will get help right away if you are not doing well or get worse. °  °This information is not intended to replace advice given to you by your health care provider. Make sure you discuss any questions you have with your health care provider. °  °Document Released: 10/20/2011 Document Revised: 08/18/2014 Document Reviewed: 09/20/2013 °Elsevier Interactive Patient Education ©2016 [employer] ° °

## 2015-07-30 ENCOUNTER — Encounter (HOSPITAL_COMMUNITY): Payer: Self-pay | Admitting: Cardiology

## 2015-07-30 ENCOUNTER — Emergency Department (HOSPITAL_COMMUNITY)
Admission: EM | Admit: 2015-07-30 | Discharge: 2015-07-31 | Disposition: A | Discharge status: Home / Self Care | Payer: Medicare Other | Attending: Emergency Medicine | Admitting: Emergency Medicine

## 2015-07-30 ENCOUNTER — Telehealth: Payer: Self-pay | Admitting: Family Medicine

## 2015-07-30 DIAGNOSIS — Z87440 Personal history of urinary (tract) infections: Secondary | ICD-10-CM | POA: Insufficient documentation

## 2015-07-30 DIAGNOSIS — I10 Essential (primary) hypertension: Secondary | ICD-10-CM | POA: Diagnosis not present

## 2015-07-30 DIAGNOSIS — Z87891 Personal history of nicotine dependence: Secondary | ICD-10-CM | POA: Insufficient documentation

## 2015-07-30 DIAGNOSIS — Z794 Long term (current) use of insulin: Secondary | ICD-10-CM | POA: Insufficient documentation

## 2015-07-30 DIAGNOSIS — Z880 Allergy status to penicillin: Secondary | ICD-10-CM | POA: Diagnosis not present

## 2015-07-30 DIAGNOSIS — G43909 Migraine, unspecified, not intractable, without status migrainosus: Secondary | ICD-10-CM | POA: Diagnosis not present

## 2015-07-30 DIAGNOSIS — Z79899 Other long term (current) drug therapy: Secondary | ICD-10-CM | POA: Insufficient documentation

## 2015-07-30 DIAGNOSIS — M545 Low back pain: Secondary | ICD-10-CM | POA: Diagnosis present

## 2015-07-30 DIAGNOSIS — M25511 Pain in right shoulder: Secondary | ICD-10-CM | POA: Diagnosis not present

## 2015-07-30 DIAGNOSIS — J45909 Unspecified asthma, uncomplicated: Secondary | ICD-10-CM | POA: Insufficient documentation

## 2015-07-30 DIAGNOSIS — Z7984 Long term (current) use of oral hypoglycemic drugs: Secondary | ICD-10-CM | POA: Insufficient documentation

## 2015-07-30 DIAGNOSIS — Z7982 Long term (current) use of aspirin: Secondary | ICD-10-CM | POA: Insufficient documentation

## 2015-07-30 DIAGNOSIS — R350 Frequency of micturition: Secondary | ICD-10-CM | POA: Diagnosis not present

## 2015-07-30 DIAGNOSIS — M62838 Other muscle spasm: Secondary | ICD-10-CM

## 2015-07-30 DIAGNOSIS — Z791 Long term (current) use of non-steroidal anti-inflammatories (NSAID): Secondary | ICD-10-CM | POA: Insufficient documentation

## 2015-07-30 LAB — COMPREHENSIVE METABOLIC PANEL
ALT: 28 U/L (ref 14–54)
AST: 49 U/L — ABNORMAL HIGH (ref 15–41)
Albumin: 3.7 g/dL (ref 3.5–5.0)
Alkaline Phosphatase: 53 U/L (ref 38–126)
Anion gap: 8 (ref 5–15)
BUN: 11 mg/dL (ref 6–20)
CO2: 22 mmol/L (ref 22–32)
Calcium: 9.3 mg/dL (ref 8.9–10.3)
Chloride: 110 mmol/L (ref 101–111)
Creatinine, Ser: 0.84 mg/dL (ref 0.44–1.00)
GFR calc Af Amer: >60 mL/min (ref >60)
GFR calc non Af Amer: >60 mL/min (ref >60)
Glucose, Bld: 55 mg/dL — ABNORMAL LOW (ref 65–99)
Potassium: 3.9 mmol/L (ref 3.5–5.1)
Sodium: 140 mmol/L (ref 135–145)
Total Bilirubin: 0.4 mg/dL (ref 0.3–1.2)
Total Protein: 6.2 g/dL — ABNORMAL LOW (ref 6.5–8.1)

## 2015-07-30 LAB — CBC WITH DIFFERENTIAL/PLATELET
Basophils Absolute: 0 10*3/uL (ref 0.0–0.1)
Basophils Relative: 1 %
Eosinophils Absolute: 0.1 10*3/uL (ref 0.0–0.7)
Eosinophils Relative: 2 %
HCT: 31.3 % — ABNORMAL LOW (ref 36.0–46.0)
Hemoglobin: 9.5 g/dL — ABNORMAL LOW (ref 12.0–15.0)
Lymphocytes Relative: 23 %
Lymphs Abs: 1.2 10*3/uL (ref 0.7–4.0)
MCH: 25.4 pg — ABNORMAL LOW (ref 26.0–34.0)
MCHC: 30.4 g/dL (ref 30.0–36.0)
MCV: 83.7 fL (ref 78.0–100.0)
Monocytes Absolute: 0.4 10*3/uL (ref 0.1–1.0)
Monocytes Relative: 7 %
Neutro Abs: 3.5 10*3/uL (ref 1.7–7.7)
Neutrophils Relative %: 67 %
Platelets: 170 10*3/uL (ref 150–400)
RBC: 3.74 MIL/uL — ABNORMAL LOW (ref 3.87–5.11)
RDW: 15.6 % — ABNORMAL HIGH (ref 11.5–15.5)
WBC: 5.2 10*3/uL (ref 4.0–10.5)

## 2015-07-30 LAB — LIPASE, BLOOD: Lipase: 30 U/L (ref 11–51)

## 2015-07-30 LAB — URINALYSIS, ROUTINE W REFLEX MICROSCOPIC
Bilirubin Urine: NEGATIVE
Glucose, UA: NEGATIVE mg/dL
Hgb urine dipstick: NEGATIVE
Ketones, ur: NEGATIVE mg/dL
Leukocytes, UA: NEGATIVE
Nitrite: NEGATIVE
Protein, ur: NEGATIVE mg/dL
Specific Gravity, Urine: 1.013 (ref 1.005–1.030)
pH: 6.5 (ref 5.0–8.0)

## 2015-07-30 LAB — D-DIMER, QUANTITATIVE: D-Dimer, Quant: 0.6 ug/mL-FEU — ABNORMAL HIGH (ref 0.00–0.50)

## 2015-07-30 LAB — CBG MONITORING, ED: Glucose-Capillary: 45 mg/dL — ABNORMAL LOW (ref 65–99)

## 2015-07-30 MED ORDER — FENTANYL CITRATE (PF) 100 MCG/2ML IJ SOLN
25.0000 ug | Freq: Once | INTRAMUSCULAR | Status: AC
  Administered 2015-07-30: 25 ug via INTRAVENOUS
  Filled 2015-07-30: qty 2

## 2015-07-30 MED ORDER — HYDROCODONE-ACETAMINOPHEN 7.5-325 MG PO TABS: 1.0000 | ORAL_TABLET | Freq: Four times a day (QID) | ORAL | Status: DC | PRN

## 2015-07-30 MED ORDER — DEXTROSE 50 % IV SOLN
25.0000 mL | Freq: Once | INTRAVENOUS | Status: AC
  Administered 2015-07-30: 25 mL via INTRAVENOUS

## 2015-07-30 MED ORDER — DIAZEPAM 2 MG PO TABS
2.0000 mg | ORAL_TABLET | Freq: Once | ORAL | Status: AC
  Administered 2015-07-30: 2 mg via ORAL
  Filled 2015-07-30: qty 1

## 2015-07-30 MED ORDER — FENTANYL CITRATE (PF) 100 MCG/2ML IJ SOLN
25.0000 ug | Freq: Once | INTRAMUSCULAR | Status: AC
  Administered 2015-07-31: 25 ug via INTRAVENOUS
  Filled 2015-07-30: qty 2

## 2015-07-30 MED ORDER — DIAZEPAM 2 MG PO TABS: 2.0000 mg | ORAL_TABLET | Freq: Four times a day (QID) | ORAL | Status: DC | PRN

## 2015-07-30 NOTE — Telephone Encounter (Signed)
Pt calling to speak to provider as her pain is not getting any better. Please contact at earliest convenience. Thank you, Fonda Kinder, ASA

## 2015-07-30 NOTE — ED Provider Notes (Signed)
CSN: [id number]     Arrival date & time 07/30/15  1713 History   First MD Initiated Contact with Patient 07/30/15 1906     Chief Complaint  Patient presents with  . Back Pain    74 yo F w/PMH of OA, asthma, HTN who returns today for right sided back pain. Was seen here last week for same and diagnosed w/muslce spasm. Has been taking norco, topical cream, and muscle relaxer with initial improvement, but pain returned today. Feels pain from right shoulder blade down to her waist. Worse with movement. She has been diagnosed with 2 UTIs in the past month and today has had increased frequency of urination. She denies CP, SOB, fever, chills, N/V, diarrhea, constipation, hematemesis, dysuria, hematuria, sick contacts, or recent travel. No hx of kidney stones.    Patient is a 74 y.o. female presenting with back pain. The history is provided by the patient.  Back Pain Location:  Lumbar spine (right sided back) Quality:  Aching Radiates to:  Does not radiate Pain severity:  Moderate Onset quality:  Sudden Timing:  Constant Progression:  Worsening Chronicity:  Recurrent Relieved by:  Nothing Ineffective treatments: norco, rest, topicals, mm relaxer. Associated symptoms: no abdominal pain, no bladder incontinence, no bowel incontinence, no chest pain, no dysuria, no fever, no headaches, no numbness, no paresthesias, no pelvic pain, no perianal numbness and no weakness     Past Medical History  Diagnosis Date  . Hypertension   . Asthma   . Migraine    Past Surgical History  Procedure Laterality Date  . Appendectomy    . Abdominal hysterectomy    . Arm surgery     Family History  Problem Relation Age of Onset  . Hearing loss Sister   . Heart attack Sister 34    s/p 4 bypasses   . Stroke Brother     46   Social History  Substance Use Topics  . Smoking status: Former Smoker -- 2.00 packs/day for 33 years    Types: Cigarettes    Start date: 08/11/1957    Quit date: 04/04/1991  .  Smokeless tobacco: Never Used  . Alcohol Use: No   OB History    No data available     Review of Systems  Constitutional: Negative for fever and chills.  Respiratory: Positive for cough (mild). Negative for shortness of breath.   Cardiovascular: Negative for chest pain, palpitations and leg swelling.  Gastrointestinal: Negative for nausea, vomiting, abdominal pain, diarrhea, constipation, abdominal distention and bowel incontinence.  Genitourinary: Positive for frequency. Negative for bladder incontinence, dysuria, hematuria, flank pain, decreased urine volume and pelvic pain.  Musculoskeletal: Positive for back pain. Negative for neck pain.  Neurological: Negative for dizziness, speech difficulty, weakness, light-headedness, numbness, headaches and paresthesias.  All other systems reviewed and are negative.     Allergies  Amoxicillin; Azithromycin; Butalbital; Clindamycin hcl; Hydrocodone-acetaminophen; Januvia; Meperidine hcl; Sulfamethoxazole; Ketorolac; Lidocaine; Macrodantin; Methocarbamol; Codeine phosphate; Etodolac; Gabapentin; Metformin and related; Naproxen; Oxycodone; Pentazocine-naloxone; Sumatriptan; and Zonegran  Home Medications   Prior to Admission medications   Medication Sig Start Date End Date Taking? Authorizing Provider  aspirin 81 MG chewable tablet Chew 81 mg by mouth daily.     Yes Historical Provider, MD  atenolol (TENORMIN) 25 MG tablet TAKE 1 BY MOUTH DAILY 10/04/14  Yes Zenia Resides, MD  baclofen (LIORESAL) 10 MG tablet TAKE 1 BY MOUTH EVERY 6 HOURS AS NEEDED FOR MIGRAINE Patient taking differently: Take 10 mg by mouth  2 (two) times daily.  08/15/14  Yes Zenia Resides, MD  benzonatate (TESSALON) 200 MG capsule Take 200 mg by mouth 3 (three) times daily as needed for cough.   Yes Historical Provider, MD  bifidobacterium infantis (ALIGN) capsule Take 1 capsule by mouth 2 (two) times daily.     Yes Historical Provider, MD  diclofenac sodium (VOLTAREN) 1 %  GEL Apply 4 g topically 4 (four) times daily. 07/26/15  Yes April Palumbo, MD  DULoxetine (CYMBALTA) 30 MG capsule Take 1 capsule (30 mg total) by mouth daily. 07/11/15  Yes Zenia Resides, MD  enalapril (VASOTEC) 10 MG tablet TAKE 1 BY MOUTH DAILY 01/03/15  Yes Zenia Resides, MD  famotidine (PEPCID) 40 MG tablet Take 40 mg by mouth every evening.    Yes Historical Provider, MD  HYDROcodone-acetaminophen (NORCO/VICODIN) 5-325 MG tablet Take 1 tablet by mouth every 6 (six) hours as needed for moderate pain.   Yes Historical Provider, MD  Insulin Glargine (LANTUS SOLOSTAR) 100 UNIT/ML Solostar Pen Inject 60 Units into the skin daily at 10 pm. Patient taking differently: Inject 60 Units into the skin every morning.  03/19/15  Yes Zenia Resides, MD  loratadine (CLARITIN) 10 MG tablet Take 10 mg by mouth daily.   Yes Historical Provider, MD  metFORMIN (GLUCOPHAGE) 500 MG tablet Take 2 tablets (1,000 mg total) by mouth 2 (two) times daily with a meal. 04/20/15  Yes Zenia Resides, MD  Multiple Vitamin (MULTIVITAMIN WITH MINERALS) TABS tablet Take 1 tablet by mouth daily.   Yes Historical Provider, MD  nortriptyline (PAMELOR) 50 MG capsule Take 50 mg by mouth at bedtime.   Yes Historical Provider, MD  Omega-3 Fatty Acids (COROMEGA OMEGA 3 SQUEEZE) EMUL Take 1 application by mouth daily.   Yes Historical Provider, MD  promethazine (PHENERGAN) 12.5 MG tablet Take 12.5 mg by mouth every 6 (six) hours as needed for nausea or vomiting.   Yes Historical Provider, MD  simvastatin (ZOCOR) 20 MG tablet Take 0.5 tablets (10 mg total) by mouth daily. 05/02/15  Yes Zenia Resides, MD  topiramate (TOPAMAX) 50 MG tablet Take 3 tablets (150 mg total) by mouth at bedtime. Patient taking differently: Take 100 mg by mouth See admin instructions. TAKES 2 TABS NIGHTLY, BUT CAN TAKE 1 ADDT'L TAB AS NEEDED FOR MIGRAINES 08/23/14  Yes Zenia Resides, MD  valACYclovir (VALTREX) 1000 MG tablet Take 1 tablet (1,000 mg total)  by mouth 3 (three) times daily. 07/26/15  Yes Zenia Resides, MD  vitamin C (ASCORBIC ACID) 250 MG tablet Take 500 mg by mouth daily.    Yes Historical Provider, MD  albuterol (PROVENTIL HFA;VENTOLIN HFA) 108 (90 BASE) MCG/ACT inhaler Inhale 2 puffs into the lungs every 6 (six) hours as needed for wheezing or shortness of breath. 04/19/14   Leone Brand, MD  diazepam (VALIUM) 2 MG tablet Take 1 tablet (2 mg total) by mouth every 6 (six) hours as needed for muscle spasms. 07/30/15   Sherian Maroon, MD  traMADol (ULTRAM) 50 MG tablet Take 1 tablet (50 mg total) by mouth every 8 (eight) hours as needed. In response to your electronic request. 09/04/14   Zenia Resides, MD   BP 129/95 mmHg  Pulse 83  Temp(Src) 98.4 F (36.9 C) (Oral)  Resp 20  SpO2 98% Physical Exam  Constitutional: She is oriented to person, place, and time. She appears well-developed and well-nourished. No distress.  HENT:  Head: Normocephalic and atraumatic.  Eyes: Pupils are equal, round, and reactive to light.  Neck: Normal range of motion.  Cardiovascular: Normal rate, regular rhythm, normal heart sounds and intact distal pulses.  Exam reveals no gallop and no friction rub.   No murmur heard. Pulmonary/Chest: Effort normal and breath sounds normal. No respiratory distress. She has no wheezes. She has no rales. She exhibits no tenderness.  Abdominal: Soft. Bowel sounds are normal. She exhibits no distension and no mass. There is tenderness (mild epigastric and LUQ pain). There is no rebound and no guarding.  Musculoskeletal: She exhibits tenderness (right back).       Arms: Lymphadenopathy:    She has no cervical adenopathy.  Neurological: She is alert and oriented to person, place, and time.  Skin: Skin is warm and dry. She is not diaphoretic.  Nursing note and vitals reviewed.   ED Course  Procedures (including critical care time) Labs Review Labs Reviewed  CBC WITH DIFFERENTIAL/PLATELET - Abnormal; Notable for  the following:    RBC 3.74 (*)    Hemoglobin 9.5 (*)    HCT 31.3 (*)    MCH 25.4 (*)    RDW 15.6 (*)    All other components within normal limits  COMPREHENSIVE METABOLIC PANEL - Abnormal; Notable for the following:    Glucose, Bld 55 (*)    Total Protein 6.2 (*)    AST 49 (*)    All other components within normal limits  D-DIMER, QUANTITATIVE (NOT AT [facility]) - Abnormal; Notable for the following:    D-Dimer, Quant 0.60 (*)    All other components within normal limits  CBG MONITORING, ED - Abnormal; Notable for the following:    Glucose-Capillary 45 (*)    All other components within normal limits  URINALYSIS, ROUTINE W REFLEX MICROSCOPIC (NOT AT [facility])  LIPASE, BLOOD    Imaging Review No results found. I have personally reviewed and evaluated these images and lab results as part of my medical decision-making.   EKG Interpretation   Date/Time:  Monday July 30 2015 19:49:00 EST Ventricular Rate:  81 PR Interval:  192 QRS Duration: 91 QT Interval:  364 QTC Calculation: 422 R Axis:   -18 Text Interpretation:  Sinus rhythm Left ventricular hypertrophy No  significant change was found Confirmed by Wyvonnia Dusky  MD, STEPHEN [phone]) on  07/30/2015 8:22:23 PM      MDM   Final diagnoses:  Muscle spasm   74 yo F w/recurrent right back pain. See HPI for details. On exam, NAD, AFVSS. Tenderness to palpation on right back w/no obvious muscle spasm noted. Pt tells me she's had shingles in this location a few times before but this time has seen no rash at all. As her pain has been intermittent over last several days, less c/w shingles. UTI? Early pyelo? Will obtain CXR, urine studies, belly labs. Given fentanyl for pain.  Pain resolved with fentanyl.   Labs ok. No uti. No sign of kidney stone.   Pain returned. Given fentanyl and valium.-->pain gone.   C/w muscle spasm. DC home w/continued norco for pain, warm baths/heating pads, gentle stretches, and valium for recurrence of  spasm. FU to PCP.   Pt was seen under the supervision of Dr. Wyvonnia Dusky.     Sherian Maroon, MD 07/30/15 [id number]  Ezequiel Essex, MD 07/31/15 [id number]  Ezequiel Essex, MD 07/31/15 [id number]

## 2015-07-30 NOTE — ED Notes (Signed)
Per MD after discussion with patient: discontinue chest Xray at this time.

## 2015-07-30 NOTE — ED Notes (Addendum)
Pt reports she was here with the same on Wednesday, but is still having upper and lower back pain on the right side. Reports she was given pain medication but it has not helped. Also reports urinary urgency.

## 2015-07-30 NOTE — Discharge Instructions (Signed)

## 2015-07-30 NOTE — ED Notes (Signed)
Pts cbg -45. Dr. Wyvonnia Dusky made aware. Pt given 3mls of dextrose and 8oz of orange juice.

## 2015-07-30 NOTE — Telephone Encounter (Signed)
Pt called back. Decided to go to hospital. Still would like dr Andria Frames to call her at [phone]

## 2015-07-30 NOTE — Telephone Encounter (Signed)
Called and discussed. She is in the ER being evaluated as we spoke.  Encouraged to let ER docs do a thorough eval for her severe, intermitant pain.  The intermitant nature does increase the chance of this being a kidney stone.

## 2015-07-30 NOTE — ED Notes (Signed)
Patient refusing chest xray at this time. MD aware.

## 2015-08-03 ENCOUNTER — Telehealth: Payer: Self-pay | Admitting: *Deleted

## 2015-08-03 DIAGNOSIS — M546 Pain in thoracic spine: Secondary | ICD-10-CM

## 2015-08-03 NOTE — Telephone Encounter (Signed)
Pt states that she is in excruciating pain and has been to the ED x 2 this week.  They told her to get in touch with her orthopaedist about some medications. She states that she has a call into them but have not heard back.  She request that I send a message to Dr. Andria Frames about some medications, advised that I would do that but unfortunately,  we close @ 12 today and will not open up until Tuesday.  Suggested that she may need to go to Urgent care if here back is hurting that bad.  She states that she would like to see if she heard from Dr. Andria Frames first.  Will forward message. [place], Salome Spotted

## 2015-08-07 ENCOUNTER — Telehealth: Payer: Self-pay | Admitting: Family Medicine

## 2015-08-07 NOTE — Telephone Encounter (Signed)
Discussed her back pain with her.

## 2015-08-07 NOTE — Telephone Encounter (Signed)
No new symptoms to suggest anything other than a musculoskeletal cause of her back pain.  Discussed and I have ordered physical therapy which she will likely not start until next week.

## 2015-08-09 ENCOUNTER — Telehealth: Payer: Self-pay | Admitting: Family Medicine

## 2015-08-09 NOTE — Telephone Encounter (Signed)
Patients husband called because no matter what they do his wife is still in pain. They have been to the ER multiple times and taking medication but it is not helping. Please call to discuss what the next steps are. jw

## 2015-08-09 NOTE — Telephone Encounter (Signed)
She has seen the ER, Orthopedist, and her chiropracter.  All give the same dx - musculoskeletal pain.  No further testing recommended.  I am unwilling to continue the oxycodone given that this is becoming chronic pain.  She states she is in agony.  Told to increase nortryptiline to BID and to not wait - set up the physical therapy ASAP.

## 2015-08-15 ENCOUNTER — Ambulatory Visit: Payer: PPO | Attending: Family Medicine | Admitting: Physical Therapy

## 2015-08-15 ENCOUNTER — Encounter: Payer: Self-pay | Admitting: Physical Therapy

## 2015-08-15 DIAGNOSIS — R5381 Other malaise: Secondary | ICD-10-CM | POA: Diagnosis not present

## 2015-08-15 DIAGNOSIS — M546 Pain in thoracic spine: Secondary | ICD-10-CM

## 2015-08-15 NOTE — Therapy (Signed)
[address] [place], [place], [address] Phone: [phone]   Fax:  [phone]  Physical Therapy Evaluation  Patient Details  Name: [name] MRN: [id number] Date of Birth: November 05, 1940 Referring Provider: Andria Frames  Encounter Date: 08/15/2015      PT End of Session - 08/15/15 1355    Visit Number 1   Date for PT Re-Evaluation 10/13/15   PT Start Time [id number]   PT Stop Time 1415   PT Time Calculation (min) 52 min   Activity Tolerance Treatment limited secondary to agitation;Patient limited by pain   Behavior During Therapy Anxious;Agitated      Past Medical History  Diagnosis Date  . Hypertension   . Asthma   . Migraine     Past Surgical History  Procedure Laterality Date  . Appendectomy    . Abdominal hysterectomy    . Arm surgery      There were no vitals filed for this visit.  Visit Diagnosis:  Right-sided thoracic back pain - Plan: PT plan of care cert/re-cert  Debility - Plan: PT plan of care cert/re-cert      Subjective Assessment - 08/15/15 1327    Subjective Patient reports that she has been having thoracic pain for about 3-4 weeks.  She is unsure of a specific cause.  Saw chiropractor and had a cortisone injection with no relief.    Limitations Sitting;Lifting;Standing;Walking;House hold activities   Diagnostic tests has had x-rays and MRI that were mostly negative, some stenosis   Patient Stated Goals hvae less pain   Currently in Pain? Yes   Pain Score 10-Worst pain ever   Pain Location Thoracic   Pain Orientation Right;Upper   Pain Descriptors / Indicators Aching;Spasm;Stabbing   Pain Type Acute pain   Pain Onset 1 to 4 weeks ago   Pain Frequency Constant   Aggravating Factors  reports pain a 10/10 at all times   Pain Relieving Factors lying down.  and heat helps some 9/10   Effect of Pain on Daily Activities reports that she stays in bed all day            [facility] PT  Assessment - 08/15/15 0001    Assessment   Medical Diagnosis thoracic pain   Referring Provider hensel   Onset Date/Surgical Date 07/15/15   Prior Therapy saw chiropractor   Precautions   Precautions None   Balance Screen   Has the patient fallen in the past 6 months Yes   How many times? 2   Has the patient had a decrease in activity level because of a fear of falling?  Yes   Is the patient reluctant to leave their home because of a fear of falling?  No   Home Ecologist residence   Additional Comments does her own housework   Prior Function   Level of [place] Retired   Leisure no exercise   Posture/Postural Control   Posture Comments anxious, always changing positions, leans toward the right side   AROM   Overall AROM Comments shoulder ROM was decreased greater than 50% she could not move her lumbar or thoracic spine due to pain levels   Palpation   Palpation comment has a large spasm and bulge in the right thoracic area, deep breathing causes pain, resisted breathing decreased pain mildly.  [facility] Adult PT Treatment/Exercise - 2015/08/19 0001    Modalities   Modalities Moist Heat;Electrical Stimulation   Moist Heat Therapy   Number Minutes Moist Heat 15 Minutes   Moist Heat Location Lumbar Spine   Electrical Stimulation   Electrical Stimulation Location thoracic area on the right   Electrical Stimulation Action IFC   Electrical Stimulation Parameters supine   Electrical Stimulation Goals Pain                PT Education - 2015-08-19 1355    Education provided Yes   Education Details shoulder shrugs and elevation as well as deep breathing   Person(s) Educated Patient;Spouse   Methods Handout;Explanation   Comprehension Verbalized understanding          PT Short Term Goals - Aug 19, 2015 1421    PT SHORT TERM GOAL #1   Title indepednetn with initial HEP   Time 2   Period  Weeks   Status New           PT Long Term Goals - 08-19-2015 1422    PT LONG TERM GOAL #1   Title understand proper posture and body mechanics   Time 8   Period Weeks   Status New   PT LONG TERM GOAL #2   Title decrease pain 50%   Time 8   Period Weeks   Status New   PT LONG TERM GOAL #3   Title increase lumbar ROM 25%   Time 8   Period Weeks   Status New   PT LONG TERM GOAL #4   Title tolerate being active for 5 minutes   Time 8   Period Weeks   Status New               Plan - 2015-08-19 1414    Clinical Impression Statement Patient with significant c/o pain rated a 10/10 at all times, in the right thoracic area.  She was limited in all motions.  I did attempt some resisted deep breathing that helped decrease some pain.  ? costo vertebral issue, but for sure the spasms are causing her pain.     Pt will benefit from skilled therapeutic intervention in order to improve on the following deficits Decreased activity tolerance;Decreased mobility;Decreased strength;Decreased range of motion;Postural dysfunction;Pain;Increased muscle spasms   Rehab Potential Good   PT Frequency 3x / week   PT Duration 8 weeks   PT Treatment/Interventions ADLs/Self Care Home Management;Electrical Stimulation;Moist Heat;Therapeutic exercise;Therapeutic activities;Ultrasound;Patient/family education;Manual techniques;Taping;Passive range of motion   PT Next Visit Plan May try some gentle STM and resisted breathing, gentle exercises.  She seems to be in a lot of pain   Consulted and Agree with Plan of Care Patient          G-Codes - 08-19-15 1428    Functional Assessment Tool Used Foto    Functional Limitation Other PT primary   Other PT Primary Current Status [phone]) 100 percent impaired, limited or restricted   Other PT Primary Goal Status [id number]) At least 40 percent but less than 60 percent impaired, limited or restricted       Problem List Patient Active Problem List   Diagnosis  Date Noted  . Thoracic back pain 07/26/2015  . Swelling of upper lip 07/26/2015  . Spinal stenosis of lumbar region 07/10/2015  . Abdominal aortic atherosclerosis ([place]) 07/10/2015  . Urinary tract infection, site not specified 06/27/2015  . Wrist pain, acute 06/02/2015  . Strain of AC joint 06/02/2015  .  Pes anserine bursitis 06/02/2015  . Memory loss 05/02/2015  . Hip pain, right 11/19/2011  . SLEEP APNEA 05/22/2010  . PERIPHERAL NEUROPATHY 02/06/2010  . PULMONARY NODULE, SOLITARY 07/20/2009  . Irritable bowel syndrome 11/12/2007  . Diabetes mellitus type 2, insulin dependent ([place]) 10/08/2006  . HYPERCHOLESTEROLEMIA 10/08/2006  . HYPERTRIGLYCERIDEMIA 10/08/2006  . Reaction, adjustment, with anxious, depressed mood 10/08/2006  . MIGRAINE, UNSPEC., W/O INTRACTABLE MIGRAINE 10/08/2006  . CARPAL TUNNEL SYNDROME 10/08/2006  . HYPERTENSION, BENIGN SYSTEMIC 10/08/2006  . RHINITIS, ALLERGIC 10/08/2006  . ASTHMA, PERSISTENT, MODERATE 10/08/2006  . REFLUX ESOPHAGITIS 10/08/2006  . ECZEMA, ATOPIC DERMATITIS 10/08/2006  . PSORIASIS 10/08/2006  . Osteoarthritis of spine 10/08/2006  . VERTIGO NOS OR DIZZINESS 10/08/2006    Sumner Boast., PT 08/15/2015, 5:40 PM  [address] [place], [place], [address] Phone: [phone]   Fax:  [phone]  Name: [name] MRN: [id number] Date of Birth: [birth year]

## 2015-08-16 ENCOUNTER — Ambulatory Visit: Payer: PPO | Admitting: Physical Therapy

## 2015-08-16 ENCOUNTER — Encounter: Payer: Self-pay | Admitting: Physical Therapy

## 2015-08-16 DIAGNOSIS — M546 Pain in thoracic spine: Secondary | ICD-10-CM

## 2015-08-16 NOTE — Therapy (Signed)
[address] [place], [place], [address] Phone: [phone]   Fax:  [phone]  Physical Therapy Treatment  Patient Details  Name: [name] MRN: [id number] Date of Birth: 1941-08-03 Referring Provider: Andria Frames  Encounter Date: 08/16/2015      PT End of Session - 08/16/15 1434    Visit Number 2   Date for PT Re-Evaluation 10/13/15   PT Start Time 1407   PT Stop Time 1455   PT Time Calculation (min) 48 min      Past Medical History  Diagnosis Date  . Hypertension   . Asthma   . Migraine     Past Surgical History  Procedure Laterality Date  . Appendectomy    . Abdominal hysterectomy    . Arm surgery      There were no vitals filed for this visit.  Visit Diagnosis:  Right-sided thoracic back pain      Subjective Assessment - 08/16/15 1428    Subjective no change, still hurts and can not get comfortable   Currently in Pain? Yes   Pain Score 8    Pain Location Thoracic                         [facility] Adult PT Treatment/Exercise - 08/16/15 0001    Exercises   Exercises Neck   Neck Exercises: Standing   Other Standing Exercises 3# shruggs,rolls and scap squeeze 10 times each   Moist Heat Therapy   Number Minutes Moist Heat 15 Minutes   Moist Heat Location Lumbar Spine   Electrical Stimulation   Electrical Stimulation Location RT thoracic   Electrical Stimulation Action IFC   Electrical Stimulation Parameters supine   Electrical Stimulation Goals Pain   Manual Therapy   Manual Therapy Soft tissue mobilization;Myofascial release;Passive ROM;Taping   Manual therapy comments KT tape for spasms RT thor   Soft tissue mobilization RT thor tender T10-L1   Passive ROM trunk seated wth PTA on ball                PT Education - 08/15/15 1355    Education provided Yes   Education Details shoulder shrugs and elevation as well as deep breathing   Person(s) Educated  Patient;Spouse   Methods Handout;Explanation   Comprehension Verbalized understanding          PT Short Term Goals - 08/15/15 1421    PT SHORT TERM GOAL #1   Title indepednetn with initial HEP   Time 2   Period Weeks   Status New           PT Long Term Goals - 08/15/15 1422    PT LONG TERM GOAL #1   Title understand proper posture and body mechanics   Time 8   Period Weeks   Status New   PT LONG TERM GOAL #2   Title decrease pain 50%   Time 8   Period Weeks   Status New   PT LONG TERM GOAL #3   Title increase lumbar ROM 25%   Time 8   Period Weeks   Status New   PT LONG TERM GOAL #4   Title tolerate being active for 5 minutes   Time 8   Period Weeks   Status New               Plan - 08/16/15 1434    Clinical Impression Statement pt tolerated therapy  well and allowed MT tech and stretches. Still with high levels of pain.   PT Next Visit Plan Assess tx and tape and progres as able          G-Codes - 09/03/15 1428    Functional Assessment Tool Used Foto    Functional Limitation Other PT primary   Other PT Primary Current Status [id number]) 100 percent impaired, limited or restricted   Other PT Primary Goal Status [id number]) At least 40 percent but less than 60 percent impaired, limited or restricted      Problem List Patient Active Problem List   Diagnosis Date Noted  . Thoracic back pain 07/26/2015  . Swelling of upper lip 07/26/2015  . Spinal stenosis of lumbar region 07/10/2015  . Abdominal aortic atherosclerosis ([place]) 07/10/2015  . Urinary tract infection, site not specified 06/27/2015  . Wrist pain, acute 06/02/2015  . Strain of AC joint 06/02/2015  . Pes anserine bursitis 06/02/2015  . Memory loss 05/02/2015  . Hip pain, right 11/19/2011  . SLEEP APNEA 05/22/2010  . PERIPHERAL NEUROPATHY 02/06/2010  . PULMONARY NODULE, SOLITARY 07/20/2009  . Irritable bowel syndrome 11/12/2007  . Diabetes mellitus type 2, insulin dependent ([place])  10/08/2006  . HYPERCHOLESTEROLEMIA 10/08/2006  . HYPERTRIGLYCERIDEMIA 10/08/2006  . Reaction, adjustment, with anxious, depressed mood 10/08/2006  . MIGRAINE, UNSPEC., W/O INTRACTABLE MIGRAINE 10/08/2006  . CARPAL TUNNEL SYNDROME 10/08/2006  . HYPERTENSION, BENIGN SYSTEMIC 10/08/2006  . RHINITIS, ALLERGIC 10/08/2006  . ASTHMA, PERSISTENT, MODERATE 10/08/2006  . REFLUX ESOPHAGITIS 10/08/2006  . ECZEMA, ATOPIC DERMATITIS 10/08/2006  . PSORIASIS 10/08/2006  . Osteoarthritis of spine 10/08/2006  . VERTIGO NOS OR DIZZINESS 10/08/2006    PAYSEUR,ANGIE 08/16/2015, 2:36 PM  [place] [facility] [address] [place], [place], [address] Phone: [phone]   Fax:  [phone]  Name: [name] MRN: [id number] Date of Birth: 01-Jan-1941

## 2015-08-17 ENCOUNTER — Telehealth: Payer: Self-pay | Admitting: Family Medicine

## 2015-08-17 MED ORDER — TRAMADOL HCL 50 MG PO TABS: 50.0000 mg | ORAL_TABLET | Freq: Four times a day (QID) | ORAL | Status: DC | PRN

## 2015-08-17 MED ORDER — BACLOFEN 10 MG PO TABS: 20.0000 mg | ORAL_TABLET | Freq: Four times a day (QID) | ORAL | Status: DC

## 2015-08-17 NOTE — Telephone Encounter (Signed)
Will forward to PCP.  Jessiah Steinhart L, RN  

## 2015-08-17 NOTE — Telephone Encounter (Signed)
States pain is "agony" and "intollerable."  Helped only transiently by physical therapy.  PT suggested GB disease due to some discomfort with eating and flatuance.  No anterior abd pain.  Last GB ultrasound was 2009 - normal  Will order ultrasound even though I doubt GB pathology.  Appointment next week to reevaluate differential dx.

## 2015-08-17 NOTE — Telephone Encounter (Signed)
Pt needs refills on, baclofen, tramadol sent to [facility] on [address].   Additionally, Pt calling to report that she has been "in agony" for the last three weeks and she has been going to therapy, and she was informed by her providers at therapy that she may have something wrong with her gallbladder. Pt would like to receive a phone call from her provider today to speak to him about this. Please advise at the earliest convenience. [name], ASA

## 2015-08-19 ENCOUNTER — Telehealth (HOSPITAL_COMMUNITY): Payer: Self-pay | Admitting: Family Medicine

## 2015-08-19 ENCOUNTER — Telehealth: Payer: Self-pay | Admitting: Family Medicine

## 2015-08-19 NOTE — Telephone Encounter (Signed)
Error

## 2015-08-19 NOTE — Telephone Encounter (Signed)
[facility] After Hours Telephone Line  Person calling: Naara Hammen Relationship to patient: Husband  Chief complaint: Confusion  Since this afternoon, patient has been confused calling pills crackers. States she has been confused about what she drank this afternoon, calling tea coffee. He is concerned that the pain medications may be causing her to be confused and wants to know if he should give her Tramadol. Talking to her currently, she appears to be back at baseline and is oriented to person place and month/year. Patient recently started Baclofen as well. Discussed with husband and suggested holding baclofen +/- Tramadol as either one could be the culprit. Discussed if symptoms return may go to the ED for physician evaluation. Husband not keen on returning to the ED, so offered for him to call back if more concerns and can triage at that point. All questions answered. Patient to follow-up with PCP on Tuesday 1/10.  Cordelia Poche, MD PGY-3, [facility] 08/19/2015, 7:17 PM

## 2015-08-21 ENCOUNTER — Ambulatory Visit: Payer: PPO | Admitting: Physical Therapy

## 2015-08-22 ENCOUNTER — Encounter: Payer: Self-pay | Admitting: Family Medicine

## 2015-08-22 ENCOUNTER — Ambulatory Visit (INDEPENDENT_AMBULATORY_CARE_PROVIDER_SITE_OTHER): Payer: PPO | Admitting: Family Medicine

## 2015-08-22 ENCOUNTER — Telehealth: Payer: Self-pay | Admitting: Family Medicine

## 2015-08-22 ENCOUNTER — Ambulatory Visit: Payer: PPO | Admitting: Physical Therapy

## 2015-08-22 VITALS — BP 149/85 | HR 107 | Temp 98.0°F | Wt 172.2 lb

## 2015-08-22 DIAGNOSIS — E119 Type 2 diabetes mellitus without complications: Secondary | ICD-10-CM | POA: Diagnosis not present

## 2015-08-22 DIAGNOSIS — M546 Pain in thoracic spine: Secondary | ICD-10-CM | POA: Diagnosis not present

## 2015-08-22 DIAGNOSIS — Z794 Long term (current) use of insulin: Secondary | ICD-10-CM

## 2015-08-22 DIAGNOSIS — R1011 Right upper quadrant pain: Secondary | ICD-10-CM

## 2015-08-22 LAB — COMPREHENSIVE METABOLIC PANEL
ALT: 31 U/L — ABNORMAL HIGH (ref 6–29)
AST: 58 U/L — ABNORMAL HIGH (ref 10–35)
Albumin: 4.5 g/dL (ref 3.6–5.1)
Alkaline Phosphatase: 65 U/L (ref 33–130)
BUN: 14 mg/dL (ref 7–25)
CO2: 27 mmol/L (ref 20–31)
Calcium: 9.8 mg/dL (ref 8.6–10.4)
Chloride: 104 mmol/L (ref 98–110)
Creat: 0.88 mg/dL (ref 0.60–0.93)
Glucose, Bld: 79 mg/dL (ref 65–99)
Potassium: 3.8 mmol/L (ref 3.5–5.3)
Sodium: 140 mmol/L (ref 135–146)
Total Bilirubin: 0.6 mg/dL (ref 0.2–1.2)
Total Protein: 7 g/dL (ref 6.1–8.1)

## 2015-08-22 LAB — CBC WITH DIFFERENTIAL/PLATELET
Basophils Absolute: 0 10*3/uL (ref 0.0–0.1)
Basophils Relative: 0 % (ref 0–1)
Eosinophils Absolute: 0.1 10*3/uL (ref 0.0–0.7)
Eosinophils Relative: 2 % (ref 0–5)
HCT: 36.2 % (ref 36.0–46.0)
Hemoglobin: 11.3 g/dL — ABNORMAL LOW (ref 12.0–15.0)
Lymphocytes Relative: 21 % (ref 12–46)
Lymphs Abs: 1.4 10*3/uL (ref 0.7–4.0)
MCH: 24.7 pg — ABNORMAL LOW (ref 26.0–34.0)
MCHC: 31.2 g/dL (ref 30.0–36.0)
MCV: 79.2 fL (ref 78.0–100.0)
MPV: 10.9 fL (ref 8.6–12.4)
Monocytes Absolute: 0.5 10*3/uL (ref 0.1–1.0)
Monocytes Relative: 8 % (ref 3–12)
Neutro Abs: 4.6 10*3/uL (ref 1.7–7.7)
Neutrophils Relative %: 69 % (ref 43–77)
Platelets: 179 10*3/uL (ref 150–400)
RBC: 4.57 MIL/uL (ref 3.87–5.11)
RDW: 16.4 % — ABNORMAL HIGH (ref 11.5–15.5)
WBC: 6.7 10*3/uL (ref 4.0–10.5)

## 2015-08-22 LAB — POCT GLYCOSYLATED HEMOGLOBIN (HGB A1C): Hemoglobin A1C: 6

## 2015-08-22 LAB — POCT SEDIMENTATION RATE: POCT SED RATE: 36 mm/hr — AB (ref 0–22)

## 2015-08-22 LAB — LIPASE: Lipase: 18 U/L (ref 7–60)

## 2015-08-22 MED ORDER — PREDNISONE 50 MG PO TABS: 50.0000 mg | ORAL_TABLET | Freq: Every day | ORAL | Status: DC

## 2015-08-22 MED ORDER — OXYCODONE-ACETAMINOPHEN 5-325 MG PO TABS: 1.0000 | ORAL_TABLET | ORAL | Status: DC | PRN

## 2015-08-22 MED ORDER — BACLOFEN 10 MG PO TABS: 10.0000 mg | ORAL_TABLET | Freq: Two times a day (BID) | ORAL | Status: DC

## 2015-08-22 NOTE — Assessment & Plan Note (Signed)
Will check ultrasound because of broad diff.

## 2015-08-22 NOTE — Telephone Encounter (Signed)
Husband called back with the following information  For Dr. Andria Frames. They Hydrocodone at the house and do not have the Oxycodone. She has Humalog and she injects 15 units under skin 3 times a day.. She is also wondering is she can take Benadryl with the hydrocodone so it doesn't upset her stomach. jw

## 2015-08-22 NOTE — Telephone Encounter (Signed)
Will forward to Dr. Andria Frames. Amyrie Illingworth,CMA

## 2015-08-22 NOTE — Patient Instructions (Signed)
Call me with the other type of insulin you have at home and with the dose of hydrocodone and # of pills. For now, just take the oxycodone for pain.  Not hydrocodone and not tramadol. Let me know if the prednisone helps the pain.  Go back to taking the baclofen one tab twice a day. Get the ultrasound. I will likely need more imaging.  Let me see the blood work first so that I order the right test.  Keep going to PT.

## 2015-08-22 NOTE — Assessment & Plan Note (Addendum)
Troubling diff dx.  Unclear to me if she has garden variety musculoskeletal pain (e.g. Subscapular bursitis, thoracic disc disease, nerve root irriation or compression fracture) or underlying medical disease (neoplastic disease, pulm, hepatic, etc.)   Will check labs and abd pain. Likely needs advanced imaging, but await screening labs before ordering. Will provide short term oxycodone and a trial of steroids.

## 2015-08-22 NOTE — Progress Notes (Signed)
   Subjective:    Patient ID: [name], female    DOB: 1941/06/03, 75 y.o.   MRN: [id number]  HPI  Patient continues to complain of SEVERE right thoracic back pain.  It has gone from intermitant, spasm like pain to constant with superimposed spasms.  The spasms do not radiate.  No rash.  Denies CP, SOB cough, fever.  Pain is occasionally worse immediately post prandial.  No change in bowel or bladder.  Does have hx of UTIs.  Thought she had hematuria x 1.  She has been to me once, ER x2, orthopedistx1, chiropractor x3 and physical therapy.  Has received one steroid shot.  The shot, chiropractor and physical therapy all provide some temporary relief.  She maxed out her tramadol and baclofen and became confused.  Currently off all baclofen and confustion has cleared. Dose not seem to hurt worse with movement of arm or back.  No numbness, rash.    Due for A1C    Review of Systems     Objective:   Physical Exam  NOTE 12 lb weight loss Lungs clear Cardiac RRR without m or g Abd diffuse mild tenderness, perhaps a little worse on right side, both RUQ and RLQ Back tender right thoracic subscapular region.  No pain on scapular movement.  Some hypersensitivity to skin. No point tenderness over any T vert.        Assessment & Plan:

## 2015-08-22 NOTE — Telephone Encounter (Signed)
I discussed.  Because steroids have increased BS in the past, set up loose SSI coverage (meals only) while on prednisone.

## 2015-08-23 ENCOUNTER — Telehealth: Payer: Self-pay | Admitting: Family Medicine

## 2015-08-23 NOTE — Telephone Encounter (Signed)
Will forward to Dr. Andria Frames. Shamon Cothran,CMA

## 2015-08-23 NOTE — Telephone Encounter (Signed)
Called and discussed.  Also given lab results.

## 2015-08-23 NOTE — Telephone Encounter (Signed)
Dr Andria Frames has to give pt the breakdown of the insulin while taking prednisone.  Blood sugar was normal today---96.   Please advise

## 2015-08-24 ENCOUNTER — Telehealth: Payer: Self-pay | Admitting: Family Medicine

## 2015-08-24 DIAGNOSIS — M546 Pain in thoracic spine: Secondary | ICD-10-CM

## 2015-08-24 NOTE — Telephone Encounter (Signed)
Pt called and said that she is having a reaction either with the Prednisone or the Oxycodone. Plus it could be a combination of the two medications together. She is very hot or very cold. It goes from one extreme to another. She would like to stop one of the medications. Please call to discuss. jw

## 2015-08-27 ENCOUNTER — Ambulatory Visit (HOSPITAL_COMMUNITY)
Admission: RE | Admit: 2015-08-27 | Discharge: 2015-08-27 | Disposition: A | Discharge status: Home / Self Care | Payer: PPO | Source: Ambulatory Visit | Attending: Family Medicine | Admitting: Family Medicine

## 2015-08-27 ENCOUNTER — Telehealth: Payer: Self-pay | Admitting: Family Medicine

## 2015-08-27 DIAGNOSIS — N200 Calculus of kidney: Secondary | ICD-10-CM | POA: Diagnosis not present

## 2015-08-27 DIAGNOSIS — R1011 Right upper quadrant pain: Secondary | ICD-10-CM

## 2015-08-27 DIAGNOSIS — M546 Pain in thoracic spine: Secondary | ICD-10-CM | POA: Diagnosis not present

## 2015-08-27 DIAGNOSIS — R932 Abnormal findings on diagnostic imaging of liver and biliary tract: Secondary | ICD-10-CM | POA: Insufficient documentation

## 2015-08-27 NOTE — Telephone Encounter (Signed)
Informed ultrasound normal.  Also spoke with radiologist.  Thoracic MRI without contrast is best test.  Has had unrelenting symptoms for 5 weeks.  Ordered.

## 2015-08-27 NOTE — Telephone Encounter (Signed)
Pt called because she went and her her [place] this morning and would like the doctor to call her when the results come in. jw

## 2015-08-28 ENCOUNTER — Telehealth: Payer: Self-pay | Admitting: Family Medicine

## 2015-08-28 NOTE — Telephone Encounter (Signed)
I have already called about the ultrasound.

## 2015-08-28 NOTE — Telephone Encounter (Signed)
Called and answered questions.  She has MRI scheduled for 1/18 at 5:30PM.  Will call back 1/19 with results.

## 2015-08-28 NOTE — Telephone Encounter (Signed)
Patient calls, states she is in extreme pain. Patient requesting to speak to Dr. Andria Frames.

## 2015-08-29 ENCOUNTER — Telehealth: Payer: Self-pay | Admitting: Family Medicine

## 2015-08-29 ENCOUNTER — Ambulatory Visit
Admission: RE | Admit: 2015-08-29 | Discharge: 2015-08-29 | Disposition: A | Discharge status: Home / Self Care | Payer: PPO | Source: Ambulatory Visit | Attending: Family Medicine | Admitting: Family Medicine

## 2015-08-29 DIAGNOSIS — M5124 Other intervertebral disc displacement, thoracic region: Secondary | ICD-10-CM | POA: Diagnosis not present

## 2015-08-29 DIAGNOSIS — M546 Pain in thoracic spine: Secondary | ICD-10-CM

## 2015-08-29 NOTE — Telephone Encounter (Signed)
Talked with husband.  I asked him if he was aware of any emotionally contributing factors.  Her pain and distress seem out of proportion to her physical findings.  He was not aware of any external stressors.  Answered his questions about taking pain meds prior to MRI.  Hopefully, it will tell us more.

## 2015-08-29 NOTE — Telephone Encounter (Signed)
Husband called and would like to speak to Dr. Andria Frames as soon as possible. His wife if going for a MRI tonight at 5:30 pm and would like to have 1 pill on anything that will relax his wife so that she can take the MRI without a problem, Please call him on his cell [phone].jw

## 2015-08-30 ENCOUNTER — Telehealth: Payer: Self-pay | Admitting: Family Medicine

## 2015-08-30 NOTE — Telephone Encounter (Signed)
Pt called and would like to know if there are any restrictions on what she can or can not do. Such as lifting, bending, ect. jw

## 2015-08-31 ENCOUNTER — Other Ambulatory Visit: Payer: PPO

## 2015-08-31 NOTE — Telephone Encounter (Signed)
Called and answered her questions.   

## 2015-09-03 ENCOUNTER — Telehealth: Payer: Self-pay | Admitting: Family Medicine

## 2015-09-03 DIAGNOSIS — Z794 Long term (current) use of insulin: Secondary | ICD-10-CM

## 2015-09-03 DIAGNOSIS — E119 Type 2 diabetes mellitus without complications: Secondary | ICD-10-CM

## 2015-09-03 MED ORDER — OXYCODONE-ACETAMINOPHEN 5-325 MG PO TABS: 1.0000 | ORAL_TABLET | Freq: Four times a day (QID) | ORAL | Status: DC | PRN

## 2015-09-03 NOTE — Telephone Encounter (Signed)
Rx printed, left up front and patient informed.

## 2015-09-03 NOTE — Telephone Encounter (Signed)
Pt is calling for a refill on her Percocet. Please call when ready. jw

## 2015-09-05 ENCOUNTER — Ambulatory Visit (INDEPENDENT_AMBULATORY_CARE_PROVIDER_SITE_OTHER): Payer: PPO | Admitting: Family Medicine

## 2015-09-05 ENCOUNTER — Encounter: Payer: Self-pay | Admitting: Family Medicine

## 2015-09-05 VITALS — BP 156/87 | HR 86 | Temp 98.2°F | Wt 174.0 lb

## 2015-09-05 DIAGNOSIS — M5124 Other intervertebral disc displacement, thoracic region: Secondary | ICD-10-CM

## 2015-09-05 DIAGNOSIS — M519 Unspecified thoracic, thoracolumbar and lumbosacral intervertebral disc disorder: Secondary | ICD-10-CM

## 2015-09-05 DIAGNOSIS — R1011 Right upper quadrant pain: Secondary | ICD-10-CM | POA: Diagnosis not present

## 2015-09-05 MED ORDER — NORTRIPTYLINE HCL 50 MG PO CAPS: 50.0000 mg | ORAL_CAPSULE | Freq: Two times a day (BID) | ORAL | Status: DC

## 2015-09-05 NOTE — Patient Instructions (Addendum)
Timeline as best I can tell.  I do not have Dr. Berenice Primas notes. Wrist x ray done 06/01/15 MRI of lumbar spine by Dr. Berenice Primas on 07/02/15 Phone call to me about lumbar spine xray on 07/10/15 Office visit here to Dr. Jerline Pain 07/12/15 Not much mention about back.  For urine infection. Phone call to me 07/25/15 asking if shingles caused the pain. Then lots of visits and phone calls. We finally diagnosed Ruptured disc T7-8 on 08/29/15  What can we do other than just wait. Physical therapy. Regular pain medicine - I won't go any stronger than oxycodone Nerve pain medicine - nortriptyline, cymbalta and gabapentin. Other meds: Muscle relaxers (baclofen) Steroids and steroid   Eventually surgery if fails to improve.  I recommend don't rush to surg.   What to do now? 1. Get back into physical therapy.   2. Start taking the nortriptyline twice a day. 3. See the neurosugeon and see if he has any other options.

## 2015-09-05 NOTE — Assessment & Plan Note (Signed)
Duration of visit was 30 minutes.  Greater than 50% counseling.   See extended patient instructions.  Given that pain is now 6 to 10 weeks out, feel neurosurg referral is warrented.  Resume physical therapy.  Increase nortriptyline for neuropathic pain.

## 2015-09-05 NOTE — Progress Notes (Signed)
   Subjective:    Patient ID: [name], female    DOB: 1941/07/30, 75 y.o.   MRN: [id number]  HPI FU subacute HNP T7-8 disc with impingement on right nerve root as the presumed etiology of her severe right distal scapular pain.  The issues seem twofold.  There is some obvious frustration about the duration of pain and a possible liability issue.  She states she stepped in a hole outside of [place] [employer] [place] (I believe that is the correct local restaurant.)  I put the timeline together as best I could.  Apparently saw Dr. Marianne Sofia of our office on 10/21 - the day of or the day after she fell.  States did not have immediate back pain but developed subsequently.  She believes just a week or two later and apparently first saw ortho, Dr. Berenice Primas.  She had a lumbar MRI, which I can see, and an office visit with injection of the right subscapular region - I do not have those notes.  The first time it is clear from our records that she had this back pain was the note of 12/14 and beyond.  I cannot firmly say whether the fall did or did not cause the pain.  Also, I date onset of symptoms at 12/14, which puts her six weeks out.  She states that she had this pain prior to Thanksgiving, which would put her ~10 weeks out - time to consider further interventions.  Also has great concerns about pain relief.  Talked extensively about avenues: 1. Regular pain meds.  I am unwilling to increase her oxycodone beyond present dose. 2. Neuropathic pain meds - already on cymbalta and low does nortriptyline.  Cannot tolerate gabapentin. 3. Other modalities.  Only did two sessions of physical therapy.  She is on a muscle relaxant.  Emphasized that pain should lessen with time.  We have tried PO steroids which she did not tolerate.  She has not yet had trial of epidural steroids.     Review of Systems     Objective:   Physical Exam Unchanged: Cannot sit still because of pain.  Pain localize distal to right scapular tip in a  palm sized distribution.  Hyperesthesia, no numbness.       Assessment & Plan:

## 2015-09-06 ENCOUNTER — Encounter: Payer: Self-pay | Admitting: Physical Therapy

## 2015-09-06 ENCOUNTER — Ambulatory Visit: Payer: PPO | Admitting: Physical Therapy

## 2015-09-06 DIAGNOSIS — M546 Pain in thoracic spine: Secondary | ICD-10-CM | POA: Diagnosis not present

## 2015-09-06 DIAGNOSIS — R5381 Other malaise: Secondary | ICD-10-CM

## 2015-09-06 NOTE — Therapy (Addendum)
[address] [place], [place], [address] Phone: [phone]   Fax:  [phone]  Physical Therapy Treatment  Patient Details  Name: [name] MRN: [id number] Date of Birth: [birth year] Referring Provider: Andria Frames  Encounter Date: 09/06/2015      PT End of Session - 09/06/15 1132    Visit Number 3   Date for PT Re-Evaluation 10/13/15   PT Start Time 1110   PT Stop Time 1148   PT Time Calculation (min) 38 min   Activity Tolerance Patient limited by pain;Treatment limited secondary to medical complications (Comment)   Behavior During Therapy Anxious;Agitated      Past Medical History  Diagnosis Date  . Hypertension   . Asthma   . Migraine     Past Surgical History  Procedure Laterality Date  . Appendectomy    . Abdominal hysterectomy    . Arm surgery      There were no vitals filed for this visit.  Visit Diagnosis:  Debility  Right-sided thoracic back pain      Subjective Assessment - 09/06/15 1112    Subjective no change, Pt enters clinic with new diagnosis ruptured disc T-7 & T-8   Currently in Pain? Yes   Pain Score 10-Worst pain ever                         [facility] Adult PT Treatment/Exercise - 09/06/15 0001    Neck Exercises: Machines for Strengthening   Other Machines for Strengthening NuStep L1 x6 min   Neck Exercises: Standing   Other Standing Exercises 3# shruggs,rolls and scap squeeze 10 times each   Neck Exercises: Seated   Other Seated Exercise Tband Rows yellow Tband 2x10   Modalities   Modalities Moist Heat;Electrical Stimulation   Moist Heat Therapy   Number Minutes Moist Heat 15 Minutes   Moist Heat Location Lumbar Spine   Electrical Stimulation   Electrical Stimulation Location RT thoracic   Electrical Stimulation Action IFC   Electrical Stimulation Parameters tolerance    Electrical Stimulation Goals Pain                  PT Short  Term Goals - 08/15/15 1421    PT SHORT TERM GOAL #1   Title indepednetn with initial HEP   Time 2   Period Weeks   Status New           PT Long Term Goals - 08/15/15 1422    PT LONG TERM GOAL #1   Title understand proper posture and body mechanics   Time 8   Period Weeks   Status New   PT LONG TERM GOAL #2   Title decrease pain 50%   Time 8   Period Weeks   Status New   PT LONG TERM GOAL #3   Title increase lumbar ROM 25%   Time 8   Period Weeks   Status New   PT LONG TERM GOAL #4   Title tolerate being active for 5 minutes   Time 8   Period Weeks   Status New               Plan - 09/06/15 1133    Clinical Impression Statement Pt centers clinic reporting the she has been diagnosed having a ruptured disc. Pt reports that she is always in 10/10 pain. Pt able to perform all exercises but has constant pain. Pt stated  that her  medicine does not control her pain and she has trouble sleeping.    Pt will benefit from skilled therapeutic intervention in order to improve on the following deficits Decreased activity tolerance;Decreased mobility;Decreased strength;Decreased range of motion;Postural dysfunction;Pain;Increased muscle spasms   Rehab Potential Good   PT Frequency 3x / week   PT Duration 8 weeks   PT Next Visit Plan assess tx, progress as able.         Problem List Patient Active Problem List   Diagnosis Date Noted  . Right upper quadrant abdominal pain 08/22/2015  . Ruptured thoracic disc 07/26/2015  . Swelling of upper lip 07/26/2015  . Spinal stenosis of lumbar region 07/10/2015  . Abdominal aortic atherosclerosis ([place]) 07/10/2015  . Urinary tract infection, site not specified 06/27/2015  . Wrist pain, acute 06/02/2015  . Strain of AC joint 06/02/2015  . Pes anserine bursitis 06/02/2015  . Memory loss 05/02/2015  . Hip pain, right 11/19/2011  . SLEEP APNEA 05/22/2010  . PERIPHERAL NEUROPATHY 02/06/2010  . PULMONARY NODULE, SOLITARY 07/20/2009   . Irritable bowel syndrome 11/12/2007  . Diabetes mellitus type 2, insulin dependent ([place]) 10/08/2006  . HYPERCHOLESTEROLEMIA 10/08/2006  . HYPERTRIGLYCERIDEMIA 10/08/2006  . Reaction, adjustment, with anxious, depressed mood 10/08/2006  . MIGRAINE, UNSPEC., W/O INTRACTABLE MIGRAINE 10/08/2006  . CARPAL TUNNEL SYNDROME 10/08/2006  . HYPERTENSION, BENIGN SYSTEMIC 10/08/2006  . RHINITIS, ALLERGIC 10/08/2006  . ASTHMA, PERSISTENT, MODERATE 10/08/2006  . REFLUX ESOPHAGITIS 10/08/2006  . ECZEMA, ATOPIC DERMATITIS 10/08/2006  . PSORIASIS 10/08/2006  . Osteoarthritis of spine 10/08/2006  . VERTIGO NOS OR DIZZINESS 10/08/2006   PHYSICAL THERAPY DISCHARGE SUMMARY  Visits from Start of Care: 3   Plan: Patient agrees to discharge.  Patient goals were not met. Patient is being discharged due to a change in medical status.  ?????       Scot Jun, PTA  09/06/2015, 11:37 AM  [address] [place], [place], [address] Phone: [phone]   Fax:  [phone]  Name: [name] MRN: [id number] Date of Birth: May 19, 1941

## 2015-09-11 DIAGNOSIS — M4317 Spondylolisthesis, lumbosacral region: Secondary | ICD-10-CM | POA: Diagnosis not present

## 2015-09-11 DIAGNOSIS — M5416 Radiculopathy, lumbar region: Secondary | ICD-10-CM | POA: Diagnosis not present

## 2015-09-11 DIAGNOSIS — M5114 Intervertebral disc disorders with radiculopathy, thoracic region: Secondary | ICD-10-CM | POA: Diagnosis not present

## 2015-09-19 ENCOUNTER — Ambulatory Visit (INDEPENDENT_AMBULATORY_CARE_PROVIDER_SITE_OTHER): Payer: PPO | Admitting: Family Medicine

## 2015-09-19 ENCOUNTER — Encounter: Payer: Self-pay | Admitting: Family Medicine

## 2015-09-19 VITALS — BP 103/59 | HR 86 | Temp 98.8°F | Wt 169.8 lb

## 2015-09-19 DIAGNOSIS — J181 Lobar pneumonia, unspecified organism: Principal | ICD-10-CM

## 2015-09-19 DIAGNOSIS — E119 Type 2 diabetes mellitus without complications: Secondary | ICD-10-CM | POA: Diagnosis not present

## 2015-09-19 DIAGNOSIS — Z794 Long term (current) use of insulin: Secondary | ICD-10-CM

## 2015-09-19 DIAGNOSIS — J189 Pneumonia, unspecified organism: Secondary | ICD-10-CM | POA: Diagnosis not present

## 2015-09-19 DIAGNOSIS — M519 Unspecified thoracic, thoracolumbar and lumbosacral intervertebral disc disorder: Secondary | ICD-10-CM | POA: Diagnosis not present

## 2015-09-19 DIAGNOSIS — M5124 Other intervertebral disc displacement, thoracic region: Secondary | ICD-10-CM

## 2015-09-19 MED ORDER — LEVOFLOXACIN 500 MG PO TABS: 500.0000 mg | ORAL_TABLET | Freq: Every day | ORAL | Status: DC

## 2015-09-19 MED ORDER — INSULIN GLARGINE 100 UNIT/ML SOLOSTAR PEN: 60.0000 [IU] | PEN_INJECTOR | Freq: Every morning | SUBCUTANEOUS | Status: DC

## 2015-09-19 NOTE — Patient Instructions (Signed)
I sent in an antibiotic. OK to have the nerve block. Ask what they are injecting. If it is a steroid/cortisone, you will need to keep a careful eye on your blood sugar for about two weeks.   Try not to get exposed to anything else.

## 2015-09-20 ENCOUNTER — Telehealth: Payer: Self-pay | Admitting: Family Medicine

## 2015-09-20 NOTE — Assessment & Plan Note (Signed)
Still symptomatic.  For nerve block.  May cause some splinting of respirations.

## 2015-09-20 NOTE — Assessment & Plan Note (Signed)
Unclear if viral respiratory illness and the crackle I hear are due to splinting/atelectasis.  Given two week hx and second sickening, will treat as pneumonia.  Intollerant of azithro.  Will use resp quinolone.

## 2015-09-20 NOTE — Telephone Encounter (Signed)
Will forward to MD to advise. Layali Freund,CMA  

## 2015-09-20 NOTE — Telephone Encounter (Signed)
Pt called because she was reading the side effects or possible side effects from taking Levofloxacin. She is wondering if that is still okay to take. Also she said the other doctor does not want to do the surgery on Tuesday now because she is sick. jw

## 2015-09-20 NOTE — Progress Notes (Signed)
   Subjective:    Patient ID: [name], female    DOB: [birth year], 75 y.o.   MRN: [id number]  HPI [name] continues a bad streak of health.  Apparently sick with respiratory symptoms including cough and fever for two weeks.  This is on top of thoracic HNP.  On questioning, she had what sounds like a URI 2 weeks ago which was improving.  Husband came down with a respiratory illness 4 days ago and she had worsening of her respiratory symptoms3 days ago.  Husband is clearly improving.  [name] states her symptoms are getting worse.  No SOB.  Deep cough which is occasionally productive.  No rigors.  Has back pain in distribution of her HNP.  She has nerve block scheduled in 5 days.     Review of Systems     Objective:   Physical ExamGen, non toxic. No tachypnea.  Talking in complete sentences.   Neck no adenopathy. Lungs Crackles in rt base. In the area of her thoracic pain.  Scattered wheeze.        Assessment & Plan:

## 2015-09-20 NOTE — Telephone Encounter (Signed)
Called and left message that I do want her to take this medication.

## 2015-09-25 ENCOUNTER — Telehealth: Payer: Self-pay | Admitting: Family Medicine

## 2015-09-25 DIAGNOSIS — Z794 Long term (current) use of insulin: Secondary | ICD-10-CM

## 2015-09-25 DIAGNOSIS — E119 Type 2 diabetes mellitus without complications: Secondary | ICD-10-CM

## 2015-09-25 MED ORDER — INSULIN GLARGINE 100 UNIT/ML SOLOSTAR PEN: 45.0000 [IU] | PEN_INJECTOR | Freq: Every morning | SUBCUTANEOUS | Status: DC

## 2015-09-25 NOTE — Telephone Encounter (Signed)
Will forward to MD to advise on how patient and adjust her insulin. Malkia Nippert,CMA

## 2015-09-25 NOTE — Telephone Encounter (Signed)
Pt calling and states that her blood sugar is running lower than usual and she would like advice on how to regulate her insulin as a result. Please advise. Sadie Reynolds, ASA

## 2015-10-02 DIAGNOSIS — M5124 Other intervertebral disc displacement, thoracic region: Secondary | ICD-10-CM | POA: Diagnosis not present

## 2015-10-02 DIAGNOSIS — Z794 Long term (current) use of insulin: Secondary | ICD-10-CM | POA: Diagnosis not present

## 2015-10-02 DIAGNOSIS — Z6834 Body mass index (BMI) 34.0-34.9, adult: Secondary | ICD-10-CM | POA: Diagnosis not present

## 2015-10-02 DIAGNOSIS — E119 Type 2 diabetes mellitus without complications: Secondary | ICD-10-CM | POA: Diagnosis not present

## 2015-10-03 ENCOUNTER — Ambulatory Visit (INDEPENDENT_AMBULATORY_CARE_PROVIDER_SITE_OTHER): Payer: PPO | Admitting: Family Medicine

## 2015-10-03 ENCOUNTER — Encounter: Payer: Self-pay | Admitting: Family Medicine

## 2015-10-03 VITALS — BP 143/73 | HR 118 | Temp 98.2°F | Wt 159.8 lb

## 2015-10-03 DIAGNOSIS — M519 Unspecified thoracic, thoracolumbar and lumbosacral intervertebral disc disorder: Secondary | ICD-10-CM | POA: Diagnosis not present

## 2015-10-03 DIAGNOSIS — N390 Urinary tract infection, site not specified: Secondary | ICD-10-CM

## 2015-10-03 DIAGNOSIS — R634 Abnormal weight loss: Secondary | ICD-10-CM | POA: Diagnosis not present

## 2015-10-03 DIAGNOSIS — M5124 Other intervertebral disc displacement, thoracic region: Secondary | ICD-10-CM

## 2015-10-03 DIAGNOSIS — R112 Nausea with vomiting, unspecified: Secondary | ICD-10-CM | POA: Diagnosis not present

## 2015-10-03 LAB — CBC WITH DIFFERENTIAL/PLATELET
Basophils Absolute: 0.1 10*3/uL (ref 0.0–0.1)
Basophils Relative: 1 % (ref 0–1)
Eosinophils Absolute: 0.2 10*3/uL (ref 0.0–0.7)
Eosinophils Relative: 3 % (ref 0–5)
HCT: 38.3 % (ref 36.0–46.0)
Hemoglobin: 12.3 g/dL (ref 12.0–15.0)
Lymphocytes Relative: 21 % (ref 12–46)
Lymphs Abs: 1.7 10*3/uL (ref 0.7–4.0)
MCH: 24.4 pg — ABNORMAL LOW (ref 26.0–34.0)
MCHC: 32.1 g/dL (ref 30.0–36.0)
MCV: 75.8 fL — ABNORMAL LOW (ref 78.0–100.0)
MPV: 10.5 fL (ref 8.6–12.4)
Monocytes Absolute: 0.9 10*3/uL (ref 0.1–1.0)
Monocytes Relative: 11 % (ref 3–12)
Neutro Abs: 5.3 10*3/uL (ref 1.7–7.7)
Neutrophils Relative %: 64 % (ref 43–77)
Platelets: 297 10*3/uL (ref 150–400)
RBC: 5.05 MIL/uL (ref 3.87–5.11)
RDW: 16.8 % — ABNORMAL HIGH (ref 11.5–15.5)
WBC: 8.3 10*3/uL (ref 4.0–10.5)

## 2015-10-03 LAB — POCT URINALYSIS DIPSTICK
Bilirubin, UA: NEGATIVE
Blood, UA: NEGATIVE
Glucose, UA: NEGATIVE
Ketones, UA: NEGATIVE
Leukocytes, UA: NEGATIVE
Nitrite, UA: NEGATIVE
Spec Grav, UA: 1.02
Urobilinogen, UA: 0.2
pH, UA: 6

## 2015-10-03 LAB — POCT SEDIMENTATION RATE: POCT SED RATE: 39 mm/hr — AB (ref 0–22)

## 2015-10-03 MED ORDER — FLUCONAZOLE 150 MG PO TABS: 150.0000 mg | ORAL_TABLET | Freq: Once | ORAL | Status: DC

## 2015-10-03 MED ORDER — ONDANSETRON HCL 4 MG PO TABS
4.0000 mg | ORAL_TABLET | Freq: Three times a day (TID) | ORAL | Status: DC | PRN
Start: 2015-10-03 — End: 2017-04-10

## 2015-10-03 NOTE — Patient Instructions (Signed)
Labs and CT scan  I will call with results

## 2015-10-04 ENCOUNTER — Ambulatory Visit: Payer: PPO | Admitting: Family Medicine

## 2015-10-04 DIAGNOSIS — R634 Abnormal weight loss: Secondary | ICD-10-CM | POA: Insufficient documentation

## 2015-10-04 LAB — COMPLETE METABOLIC PANEL WITH GFR
ALT: 26 U/L (ref 6–29)
AST: 64 U/L — ABNORMAL HIGH (ref 10–35)
Albumin: 4.4 g/dL (ref 3.6–5.1)
Alkaline Phosphatase: 65 U/L (ref 33–130)
BUN: 11 mg/dL (ref 7–25)
CO2: 23 mmol/L (ref 20–31)
Calcium: 10 mg/dL (ref 8.6–10.4)
Chloride: 99 mmol/L (ref 98–110)
Creat: 0.83 mg/dL (ref 0.60–0.93)
GFR, Est African American: 81 mL/min (ref >=60)
GFR, Est Non African American: 70 mL/min (ref >=60)
Glucose, Bld: 148 mg/dL — ABNORMAL HIGH (ref 65–99)
Potassium: 3.7 mmol/L (ref 3.5–5.3)
Sodium: 138 mmol/L (ref 135–146)
Total Bilirubin: 0.7 mg/dL (ref 0.2–1.2)
Total Protein: 7.7 g/dL (ref 6.1–8.1)

## 2015-10-04 NOTE — Assessment & Plan Note (Signed)
Will hold off on procedure because she is improving and because I have concerns about underlying disease.

## 2015-10-04 NOTE — Assessment & Plan Note (Signed)
She also had some dysuria, but UA is normal.

## 2015-10-04 NOTE — Progress Notes (Signed)
   Subjective:    Patient ID: [name], female    DOB: 1941-05-27, 75 y.o.   MRN: [id number]  HPI [name] continues to be a bit of a puzzle.  I know she has: 1. A now subacute compression fracture.  A nerve block has been put on hold until I give her medical clearance. 2. Grief: she has just lost a daughter-in-law with whom she is very close. 3. She had an acute lower respiratory illness.  She has a host of continued symptoms plus an ongoing significant weight loss, which now stands at 25+ lbs.  Symptoms include, vaginal yeast infection, diarrhea, nausea, decreased appetite, hot and cold sweats, night sweat, epigastric stomach cramps. Vomiting, dry cough.  On the good side, she is having less back pain.  She may not need the procedure after all.  Only lab abnormalities thus far have been a low level sed rate elevation and mild increase in AST attributed to her known fatty liver.     Review of Systems     Objective:   Physical Exam Lungs clear CardiacRRR without m or g Abd, epigastric and left lower quadrent tenderness, mild, no rebound.       Assessment & Plan:

## 2015-10-04 NOTE — Assessment & Plan Note (Signed)
The big question is do the problems that I know she has account for her symptoms and weight loss - or is there something I am missing.  I will repeat labs and proceed with CT of the abd.  I am most concerned about the objective 25 lb weight loss.

## 2015-10-08 ENCOUNTER — Other Ambulatory Visit: Payer: PPO

## 2015-10-08 ENCOUNTER — Ambulatory Visit
Admission: RE | Admit: 2015-10-08 | Discharge: 2015-10-08 | Disposition: A | Discharge status: Home / Self Care | Payer: PPO | Source: Ambulatory Visit | Attending: Family Medicine | Admitting: Family Medicine

## 2015-10-08 DIAGNOSIS — R112 Nausea with vomiting, unspecified: Secondary | ICD-10-CM

## 2015-10-08 DIAGNOSIS — R1030 Lower abdominal pain, unspecified: Secondary | ICD-10-CM | POA: Diagnosis not present

## 2015-10-10 ENCOUNTER — Ambulatory Visit: Payer: PPO | Admitting: Family Medicine

## 2015-10-15 ENCOUNTER — Telehealth: Payer: Self-pay | Admitting: Family Medicine

## 2015-10-15 ENCOUNTER — Other Ambulatory Visit: Payer: Self-pay | Admitting: *Deleted

## 2015-10-15 MED ORDER — ATENOLOL 25 MG PO TABS: ORAL_TABLET | ORAL | Status: DC

## 2015-10-15 MED ORDER — ENALAPRIL MALEATE 10 MG PO TABS: ORAL_TABLET | ORAL | Status: DC

## 2015-10-15 NOTE — Telephone Encounter (Signed)
Dr. Brien Few is on Epic so I can route this message to him. Patient was scheduled for a nerve block by Dr. Brien Few which was put on hold due to concern about fever and weight loss.  My office visit of 10/04/15 investigated this and found no serious medical cause.  My assumption at this point is that her symptoms are a combo of minor viral illness, which she has now cleared, pain from her now subacute compression fracture and grief over the recent death of her daughter-in-law.  I will continue to follow but feel she is medically safe for this procedure.  She has no evidence of active, acute infection.

## 2015-10-15 NOTE — Telephone Encounter (Signed)
Pt called and would like the doctor to fax a medical release over to Dr. Biagio Quint office [phone]. She said that she really feels she needs to do this procedure. jw

## 2015-10-17 ENCOUNTER — Telehealth: Payer: Self-pay | Admitting: Family Medicine

## 2015-10-17 MED ORDER — ATENOLOL 25 MG PO TABS: ORAL_TABLET | ORAL | Status: DC

## 2015-10-17 MED ORDER — ENALAPRIL MALEATE 10 MG PO TABS: ORAL_TABLET | ORAL | Status: DC

## 2015-10-17 NOTE — Telephone Encounter (Signed)
Medication resent to [facility] per patient request.  Medications were already approved for refill.  Derl Barrow, RN

## 2015-10-17 NOTE — Telephone Encounter (Signed)
Pt called because Dr. Andria Frames was suppose to have sent Enalapril and atenolol to her other pharmacy [facility]. Can we do this. jw

## 2015-10-18 ENCOUNTER — Telehealth: Payer: Self-pay | Admitting: Family Medicine

## 2015-10-18 NOTE — Telephone Encounter (Signed)
Telephone note from  10-15-15 printed and faxed to number given. Keelee Yankey,CMA

## 2015-10-18 NOTE — Telephone Encounter (Signed)
Pt is calling because Dr. Andria Frames sent a medical release through Epic for the patient to go to Dr. Brien Few. That office is requesting that this be faxed to them at [phone]

## 2015-10-18 NOTE — Telephone Encounter (Signed)
Pt informed of below. Zimmerman Rumple, Dung Prien D, CMA  

## 2015-10-22 DIAGNOSIS — Z961 Presence of intraocular lens: Secondary | ICD-10-CM | POA: Diagnosis not present

## 2015-10-22 DIAGNOSIS — E119 Type 2 diabetes mellitus without complications: Secondary | ICD-10-CM | POA: Diagnosis not present

## 2015-10-31 ENCOUNTER — Encounter: Payer: Self-pay | Admitting: Family Medicine

## 2015-10-31 ENCOUNTER — Ambulatory Visit (INDEPENDENT_AMBULATORY_CARE_PROVIDER_SITE_OTHER): Payer: PPO | Admitting: Family Medicine

## 2015-10-31 VITALS — BP 112/59 | HR 80 | Temp 98.1°F | Ht 60.0 in | Wt 166.6 lb

## 2015-10-31 DIAGNOSIS — M5124 Other intervertebral disc displacement, thoracic region: Secondary | ICD-10-CM

## 2015-10-31 DIAGNOSIS — Z794 Long term (current) use of insulin: Secondary | ICD-10-CM

## 2015-10-31 DIAGNOSIS — M519 Unspecified thoracic, thoracolumbar and lumbosacral intervertebral disc disorder: Secondary | ICD-10-CM

## 2015-10-31 DIAGNOSIS — R634 Abnormal weight loss: Secondary | ICD-10-CM | POA: Diagnosis not present

## 2015-10-31 DIAGNOSIS — D500 Iron deficiency anemia secondary to blood loss (chronic): Secondary | ICD-10-CM | POA: Diagnosis not present

## 2015-10-31 DIAGNOSIS — D508 Other iron deficiency anemias: Secondary | ICD-10-CM

## 2015-10-31 DIAGNOSIS — E119 Type 2 diabetes mellitus without complications: Secondary | ICD-10-CM | POA: Diagnosis not present

## 2015-10-31 LAB — CBC
HCT: 33.4 % — ABNORMAL LOW (ref 36.0–46.0)
Hemoglobin: 10.1 g/dL — ABNORMAL LOW (ref 12.0–15.0)
MCH: 23 pg — ABNORMAL LOW (ref 26.0–34.0)
MCHC: 30.2 g/dL (ref 30.0–36.0)
MCV: 75.9 fL — ABNORMAL LOW (ref 78.0–100.0)
Platelets: 180 10*3/uL (ref 150–400)
RBC: 4.4 MIL/uL (ref 3.87–5.11)
RDW: 17.6 % — ABNORMAL HIGH (ref 11.5–15.5)
WBC: 6.1 10*3/uL (ref 4.0–10.5)

## 2015-10-31 LAB — FERRITIN: Ferritin: 10 ng/mL — ABNORMAL LOW (ref 20–288)

## 2015-10-31 LAB — POCT GLYCOSYLATED HEMOGLOBIN (HGB A1C): Hemoglobin A1C: 5.6

## 2015-10-31 NOTE — Patient Instructions (Signed)
See me in 3 months if things are going great.  Sooner if any problems. Good luck with the nerve block. Get your colonoscopy scheduled. Stay off the insulin. Go back to checking your blood sugars once a day. I will call with the lab test results.

## 2015-11-01 MED ORDER — FERROUS SULFATE 325 (65 FE) MG PO TABS: 325.0000 mg | ORAL_TABLET | Freq: Two times a day (BID) | ORAL | Status: DC

## 2015-11-01 NOTE — Assessment & Plan Note (Signed)
She will proceed with nerve ablation.

## 2015-11-01 NOTE — Progress Notes (Signed)
   Subjective:    Patient ID: [name], female    DOB: 07-23-1941, 75 y.o.   MRN: [id number]  HPI FU multiple problems.  Most seem to moving in a very positive direction. Back pain - improved but still present.  She plans to proceed with the nerve block. Wt loss - nicely stabilized.  We discussed not regaining her previous weight. Complicated grief.  Loss of daughter in law.  She is doing OK. Diabetes.  She has been able to completely wean herself off all insulin.  A1C today is great.  Assume this is due to the significant weight loss.    Review of Systems     Objective:   Physical Exam Lungs clear Cardiac RRR without m or g Abd benign        Assessment & Plan:

## 2015-11-01 NOTE — Assessment & Plan Note (Signed)
Now well controled off insulin.  Likely due to weight losss

## 2015-11-01 NOTE — Assessment & Plan Note (Addendum)
CBC and ferritin ordered due to worsening hypochromia.   Labs confirm iron deficiency.  Informed and started on iron Also emphasized need for colonoscopy.

## 2015-11-01 NOTE — Assessment & Plan Note (Signed)
Stabilized nicely.  Do not regain.

## 2015-11-14 DIAGNOSIS — M5114 Intervertebral disc disorders with radiculopathy, thoracic region: Secondary | ICD-10-CM | POA: Diagnosis not present

## 2015-11-14 DIAGNOSIS — M5124 Other intervertebral disc displacement, thoracic region: Secondary | ICD-10-CM | POA: Diagnosis not present

## 2015-11-14 DIAGNOSIS — Z6834 Body mass index (BMI) 34.0-34.9, adult: Secondary | ICD-10-CM | POA: Diagnosis not present

## 2015-11-19 ENCOUNTER — Other Ambulatory Visit: Payer: Self-pay | Admitting: Family Medicine

## 2015-11-27 DIAGNOSIS — M5416 Radiculopathy, lumbar region: Secondary | ICD-10-CM | POA: Diagnosis not present

## 2015-11-27 DIAGNOSIS — Z6834 Body mass index (BMI) 34.0-34.9, adult: Secondary | ICD-10-CM | POA: Diagnosis not present

## 2015-11-27 DIAGNOSIS — M4806 Spinal stenosis, lumbar region: Secondary | ICD-10-CM | POA: Diagnosis not present

## 2015-12-04 ENCOUNTER — Other Ambulatory Visit: Payer: Self-pay

## 2015-12-04 DIAGNOSIS — Z1211 Encounter for screening for malignant neoplasm of colon: Secondary | ICD-10-CM | POA: Diagnosis not present

## 2015-12-04 DIAGNOSIS — D509 Iron deficiency anemia, unspecified: Secondary | ICD-10-CM | POA: Diagnosis not present

## 2015-12-04 DIAGNOSIS — K5900 Constipation, unspecified: Secondary | ICD-10-CM | POA: Diagnosis not present

## 2015-12-04 DIAGNOSIS — Z1231 Encounter for screening mammogram for malignant neoplasm of breast: Secondary | ICD-10-CM

## 2015-12-04 DIAGNOSIS — E669 Obesity, unspecified: Secondary | ICD-10-CM | POA: Diagnosis not present

## 2015-12-18 ENCOUNTER — Other Ambulatory Visit: Payer: Self-pay | Admitting: *Deleted

## 2015-12-18 DIAGNOSIS — E7800 Pure hypercholesterolemia, unspecified: Secondary | ICD-10-CM

## 2015-12-18 MED ORDER — SIMVASTATIN 20 MG PO TABS: 10.0000 mg | ORAL_TABLET | Freq: Every day | ORAL | Status: DC

## 2015-12-21 DIAGNOSIS — M5124 Other intervertebral disc displacement, thoracic region: Secondary | ICD-10-CM | POA: Diagnosis not present

## 2015-12-21 DIAGNOSIS — M5114 Intervertebral disc disorders with radiculopathy, thoracic region: Secondary | ICD-10-CM | POA: Diagnosis not present

## 2016-01-01 DIAGNOSIS — M4806 Spinal stenosis, lumbar region: Secondary | ICD-10-CM | POA: Diagnosis not present

## 2016-01-01 DIAGNOSIS — M5416 Radiculopathy, lumbar region: Secondary | ICD-10-CM | POA: Diagnosis not present

## 2016-01-08 ENCOUNTER — Ambulatory Visit
Admission: RE | Admit: 2016-01-08 | Discharge: 2016-01-08 | Disposition: A | Discharge status: Home / Self Care | Payer: PPO | Source: Ambulatory Visit

## 2016-01-08 DIAGNOSIS — Z1231 Encounter for screening mammogram for malignant neoplasm of breast: Secondary | ICD-10-CM

## 2016-01-14 ENCOUNTER — Other Ambulatory Visit: Payer: Self-pay | Admitting: *Deleted

## 2016-01-14 DIAGNOSIS — E119 Type 2 diabetes mellitus without complications: Secondary | ICD-10-CM

## 2016-01-14 DIAGNOSIS — Z794 Long term (current) use of insulin: Secondary | ICD-10-CM

## 2016-01-14 MED ORDER — GLUCOSE BLOOD VI STRP: 1.0000 | ORAL_STRIP | Freq: Three times a day (TID) | Status: DC

## 2016-01-14 MED ORDER — ONETOUCH DELICA LANCETS 33G MISC: Status: DC

## 2016-01-16 DIAGNOSIS — K2951 Unspecified chronic gastritis with bleeding: Secondary | ICD-10-CM | POA: Diagnosis not present

## 2016-01-16 DIAGNOSIS — K921 Melena: Secondary | ICD-10-CM | POA: Diagnosis not present

## 2016-01-16 DIAGNOSIS — D509 Iron deficiency anemia, unspecified: Secondary | ICD-10-CM | POA: Diagnosis not present

## 2016-01-16 DIAGNOSIS — Z1211 Encounter for screening for malignant neoplasm of colon: Secondary | ICD-10-CM | POA: Diagnosis not present

## 2016-01-16 DIAGNOSIS — K2970 Gastritis, unspecified, without bleeding: Secondary | ICD-10-CM | POA: Diagnosis not present

## 2016-01-25 ENCOUNTER — Other Ambulatory Visit: Payer: Self-pay | Admitting: *Deleted

## 2016-01-25 NOTE — Telephone Encounter (Signed)
Patient states her yeast infection has come back.  She would like to know if she can get a refill of her diflucan.  [name],CMA

## 2016-01-28 MED ORDER — FLUCONAZOLE 150 MG PO TABS: 150.0000 mg | ORAL_TABLET | Freq: Once | ORAL | Status: DC

## 2016-01-29 ENCOUNTER — Other Ambulatory Visit: Payer: Self-pay | Admitting: *Deleted

## 2016-01-29 ENCOUNTER — Other Ambulatory Visit: Payer: Self-pay | Admitting: Family Medicine

## 2016-01-29 DIAGNOSIS — Z794 Long term (current) use of insulin: Secondary | ICD-10-CM

## 2016-01-29 DIAGNOSIS — E119 Type 2 diabetes mellitus without complications: Secondary | ICD-10-CM

## 2016-01-29 MED ORDER — METFORMIN HCL 500 MG PO TABS: 1000.0000 mg | ORAL_TABLET | Freq: Two times a day (BID) | ORAL | Status: DC

## 2016-01-31 DIAGNOSIS — K641 Second degree hemorrhoids: Secondary | ICD-10-CM | POA: Diagnosis not present

## 2016-01-31 DIAGNOSIS — K5730 Diverticulosis of large intestine without perforation or abscess without bleeding: Secondary | ICD-10-CM | POA: Diagnosis not present

## 2016-01-31 DIAGNOSIS — K5900 Constipation, unspecified: Secondary | ICD-10-CM | POA: Diagnosis not present

## 2016-01-31 DIAGNOSIS — D509 Iron deficiency anemia, unspecified: Secondary | ICD-10-CM | POA: Diagnosis not present

## 2016-02-01 ENCOUNTER — Encounter: Payer: Self-pay | Admitting: Family Medicine

## 2016-02-01 NOTE — Progress Notes (Signed)
Patient ID: [name], female   DOB: 1940-12-04, 75 y.o.   MRN: [id number] Received blood work from [facility].  CBC and ferritin.  Still with FE deficiency with numbers very similar to my 10/31/15 CBC

## 2016-02-21 ENCOUNTER — Encounter (HOSPITAL_COMMUNITY): Admission: RE | Payer: Self-pay | Source: Ambulatory Visit

## 2016-02-21 ENCOUNTER — Ambulatory Visit (HOSPITAL_COMMUNITY): Admission: RE | Admit: 2016-02-21 | Payer: PPO | Source: Ambulatory Visit | Admitting: Gastroenterology

## 2016-02-21 SURGERY — IMAGING PROCEDURE, GI TRACT, INTRALUMINAL, VIA CAPSULE
Anesthesia: LOCAL

## 2016-03-03 ENCOUNTER — Ambulatory Visit (INDEPENDENT_AMBULATORY_CARE_PROVIDER_SITE_OTHER): Payer: PPO | Admitting: Internal Medicine

## 2016-03-03 ENCOUNTER — Encounter: Payer: Self-pay | Admitting: Internal Medicine

## 2016-03-03 DIAGNOSIS — R05 Cough: Secondary | ICD-10-CM

## 2016-03-03 DIAGNOSIS — R059 Cough, unspecified: Secondary | ICD-10-CM | POA: Insufficient documentation

## 2016-03-03 MED ORDER — DOXYCYCLINE HYCLATE 100 MG PO TABS: 100.0000 mg | ORAL_TABLET | Freq: Two times a day (BID) | ORAL | 0 refills | Status: DC

## 2016-03-03 MED ORDER — FLUCONAZOLE 150 MG PO TABS: 150.0000 mg | ORAL_TABLET | Freq: Once | ORAL | 0 refills | Status: DC

## 2016-03-03 MED ORDER — PREDNISONE 20 MG PO TABS: 40.0000 mg | ORAL_TABLET | Freq: Every day | ORAL | 0 refills | Status: DC

## 2016-03-03 NOTE — Progress Notes (Signed)
Subjective:    [name] - 75 y.o. female MRN [id number]  Date of birth: 1941/01/31  HPI  [name] is here for SDA for cough.  COUGH  Has been coughing for 14 days. Feels like it started in her sinuses. Couldn't stand the pressure of her glasses in the sinus area.  Cough is: Now productive was dry prior.  Sputum production: Green  Medications tried: Diabetic OTC cough medicine, cough drops  Taking blood pressure medications: Yes   Symptoms Runny nose: Yes  Mucous in back of throat: No  Throat burning or reflux: No  Wheezing or asthma: Yes. Has h/o asthma. Has not been using Albuterol.  Fever: No  Chest Pain: With cough.  Shortness of breath: No  Leg swelling: No  Hemoptysis: No  Weight loss: No   ROS see HPI Smoking Status noted   Health Maintenance:  Health Maintenance Due  Topic Date Due  . COLONOSCOPY  01/09/2014  . OPHTHALMOLOGY EXAM  11/28/2015    -  reports that she quit smoking about 24 years ago. Her smoking use included Cigarettes. She started smoking about 58 years ago. She has a 66.00 pack-year smoking history. She has never used smokeless tobacco. - Review of Systems: Per HPI. - Past Medical History: Patient Active Problem List   Diagnosis Date Noted  . Cough 03/03/2016  . Iron deficiency anemia due to chronic blood loss 10/31/2015  . Weight loss, abnormal 10/04/2015  . Nausea with vomiting 10/03/2015  . Pneumonia due to infectious organism 09/19/2015  . Right upper quadrant abdominal pain 08/22/2015  . Ruptured thoracic disc 07/26/2015  . Swelling of upper lip 07/26/2015  . Spinal stenosis of lumbar region 07/10/2015  . Abdominal aortic atherosclerosis ([place]) 07/10/2015  . Urinary tract infection, site not specified 06/27/2015  . Wrist pain, acute 06/02/2015  . Strain of AC joint 06/02/2015  . Pes anserine bursitis 06/02/2015  . Memory loss 05/02/2015  . Hip pain, right 11/19/2011  . SLEEP APNEA 05/22/2010  . PERIPHERAL NEUROPATHY  02/06/2010  . PULMONARY NODULE, SOLITARY 07/20/2009  . Irritable bowel syndrome 11/12/2007  . Non-insulin dependent type 2 diabetes mellitus ([place]) 10/08/2006  . HYPERCHOLESTEROLEMIA 10/08/2006  . HYPERTRIGLYCERIDEMIA 10/08/2006  . Reaction, adjustment, with anxious, depressed mood 10/08/2006  . MIGRAINE, UNSPEC., W/O INTRACTABLE MIGRAINE 10/08/2006  . CARPAL TUNNEL SYNDROME 10/08/2006  . HYPERTENSION, BENIGN SYSTEMIC 10/08/2006  . RHINITIS, ALLERGIC 10/08/2006  . ASTHMA, PERSISTENT, MODERATE 10/08/2006  . REFLUX ESOPHAGITIS 10/08/2006  . ECZEMA, ATOPIC DERMATITIS 10/08/2006  . PSORIASIS 10/08/2006  . Osteoarthritis of spine 10/08/2006  . VERTIGO NOS OR DIZZINESS 10/08/2006   - Medications: reviewed and updated    Objective:   Physical Exam BP 108/64 (BP Location: Right Arm, Patient Position: Sitting, Cuff Size: Large)   Pulse (!) 101   Temp 99.1 F (37.3 C) (Oral)   Ht 5' (1.524 m)   Wt 167 lb 12.8 oz (76.1 kg)   BMI 32.77 kg/m  Gen: NAD, alert, cooperative with exam, appears ill but non-toxic  HEENT: TTP of right frontal and maxillary sinuses, enlarged nasal turbinates bilaterally, no tonsillar cobblestoning, TM normal bilaterally  CV: RRR, good S1/S2, no murmur, no edema, capillary refill brisk  Resp: Diffuse wheezing with coarse breath sounds but moving air well in all fields. Normal WOB. Dry coughing throughout exam but worsened with deep inspiration.     Assessment & Plan:   Cough Suspect patient has a bacterial sinusitis as well as a bronchitis. Patient is not in  active respiratory distress on exam and has appropriate O2 saturations.  -Rx for Doxycyline given (allergic to augmentin)  -Prednisone 40 mg x5 days (patient with well controlled T2DM, discussed that steroids may temporarily elevate CBGs)  -supportive care with cough syrup, nasal saline spray  -return precautions discussed     Phill Myron, D.O. 03/03/2016, 4:32 PM PGY-2, [place]

## 2016-03-03 NOTE — Assessment & Plan Note (Addendum)
Suspect patient has a bacterial sinusitis as well as a bronchitis. Patient is not in active respiratory distress on exam and has appropriate O2 saturations.  -Rx for Doxycyline given (allergic to augmentin)  -Prednisone 40 mg x5 days (patient with well controlled T2DM, discussed that steroids may temporarily elevate CBGs)  -supportive care with cough syrup, nasal saline spray  -return precautions discussed

## 2016-03-03 NOTE — Patient Instructions (Signed)
Take the Doxycyline twice a day for 10 days. Take the prednisone daily for five days.   Use your albuterol at home because you are wheezing. I would also suggest a nasal saline spray to help with congestion.   If you start having difficulty breathing, develop new fevers, or cough does not improve with antibiotics/steroids you need to be seen again.

## 2016-03-13 ENCOUNTER — Encounter: Payer: Self-pay | Admitting: Family Medicine

## 2016-03-13 ENCOUNTER — Ambulatory Visit (INDEPENDENT_AMBULATORY_CARE_PROVIDER_SITE_OTHER): Payer: PPO | Admitting: Family Medicine

## 2016-03-13 VITALS — BP 109/63 | HR 81 | Temp 98.3°F

## 2016-03-13 DIAGNOSIS — F4323 Adjustment disorder with mixed anxiety and depressed mood: Secondary | ICD-10-CM | POA: Diagnosis not present

## 2016-03-13 DIAGNOSIS — I10 Essential (primary) hypertension: Secondary | ICD-10-CM | POA: Diagnosis not present

## 2016-03-13 DIAGNOSIS — E119 Type 2 diabetes mellitus without complications: Secondary | ICD-10-CM

## 2016-03-13 DIAGNOSIS — D500 Iron deficiency anemia secondary to blood loss (chronic): Secondary | ICD-10-CM | POA: Diagnosis not present

## 2016-03-13 LAB — POCT GLYCOSYLATED HEMOGLOBIN (HGB A1C): Hemoglobin A1C: 5.7

## 2016-03-13 MED ORDER — METFORMIN HCL 1000 MG PO TABS: 1000.0000 mg | ORAL_TABLET | Freq: Two times a day (BID) | ORAL | 3 refills | Status: DC

## 2016-03-13 NOTE — Patient Instructions (Addendum)
I will call with the CBC results. Sid should come back in and get checked for pneumonia and whooping cough. Your A1C is great.  I sent a prescription to [facility].   If your blood count is fine, I would assume the fatique is from the infection and should get better.

## 2016-03-14 LAB — CBC
HCT: 30.7 % — ABNORMAL LOW (ref 35.0–45.0)
Hemoglobin: 9.8 g/dL — ABNORMAL LOW (ref 11.7–15.5)
MCH: 26.8 pg — ABNORMAL LOW (ref 27.0–33.0)
MCHC: 31.9 g/dL — ABNORMAL LOW (ref 32.0–36.0)
MCV: 84.1 fL (ref 80.0–100.0)
MPV: 10.4 fL (ref 7.5–12.5)
Platelets: 188 10*3/uL (ref 140–400)
RBC: 3.65 MIL/uL — ABNORMAL LOW (ref 3.80–5.10)
RDW: 16.1 % — ABNORMAL HIGH (ref 11.0–15.0)
WBC: 6 10*3/uL (ref 3.8–10.8)

## 2016-03-14 NOTE — Assessment & Plan Note (Signed)
Much improved with passage of time after family member death.

## 2016-03-14 NOTE — Progress Notes (Signed)
   Subjective:    Patient ID: [name], female    DOB: [birth year], 75 y.o.   MRN: [id number]  HPI Things have generally been going well. 1Did have sinusitis 2 weeks ago.  Took last antibiotic today.  Does complain of fatigue. 2 Recheck CBC.  On iron.  Perhaps the fatigue is related. 3. Recheck DM.  Had weight loss and is totally off insulin.  Just on metformin.  Home BS running nicely. 4. Had colonoscopy and EGD by Dr. Collene Mares.  No source of bleeding.  Maybe needs SB camera.   5. She has had eye visit.  We will request records. 6. Now six months since death of daughter in law.  Things are slowly returning to normal.   7. No lightheadness or side effects with meds.    Review of Systems     Objective:   Physical Exam Lungs clear Skin and conjunctive do not look pain.   Cardiac RRR without m or g        Assessment & Plan:

## 2016-03-14 NOTE — Assessment & Plan Note (Signed)
Great control. 

## 2016-03-14 NOTE — Assessment & Plan Note (Signed)
Nice response to iron.  GI seeing.

## 2016-03-14 NOTE — Assessment & Plan Note (Signed)
Doing great after weight loss, just on metformin.  No hypoglycemic spells.  Continue.

## 2016-04-24 ENCOUNTER — Telehealth: Payer: Self-pay | Admitting: Family Medicine

## 2016-04-24 MED ORDER — METOPROLOL SUCCINATE ER 25 MG PO TB24: 25.0000 mg | ORAL_TABLET | Freq: Every day | ORAL | 3 refills | Status: DC

## 2016-04-24 NOTE — Telephone Encounter (Signed)
If I remember atenolol is still on back order.  Derl Barrow, RN

## 2016-04-24 NOTE — Telephone Encounter (Signed)
Needs replacement for atenolol.  Last one was taken last night. [place]

## 2016-04-30 ENCOUNTER — Other Ambulatory Visit: Payer: Self-pay | Admitting: Internal Medicine

## 2016-06-11 ENCOUNTER — Other Ambulatory Visit: Payer: Self-pay | Admitting: Family Medicine

## 2016-08-07 ENCOUNTER — Ambulatory Visit (INDEPENDENT_AMBULATORY_CARE_PROVIDER_SITE_OTHER): Payer: PPO | Admitting: Family Medicine

## 2016-08-07 ENCOUNTER — Encounter: Payer: Self-pay | Admitting: Family Medicine

## 2016-08-07 ENCOUNTER — Ambulatory Visit (HOSPITAL_COMMUNITY)
Admission: RE | Admit: 2016-08-07 | Discharge: 2016-08-07 | Disposition: A | Discharge status: Home / Self Care | Payer: PPO | Source: Ambulatory Visit | Attending: Family Medicine | Admitting: Family Medicine

## 2016-08-07 VITALS — BP 160/94 | HR 92 | Temp 98.3°F | Wt 175.0 lb

## 2016-08-07 DIAGNOSIS — R0789 Other chest pain: Secondary | ICD-10-CM

## 2016-08-07 DIAGNOSIS — R9431 Abnormal electrocardiogram [ECG] [EKG]: Secondary | ICD-10-CM | POA: Insufficient documentation

## 2016-08-07 DIAGNOSIS — I517 Cardiomegaly: Secondary | ICD-10-CM | POA: Diagnosis not present

## 2016-08-07 DIAGNOSIS — J0100 Acute maxillary sinusitis, unspecified: Secondary | ICD-10-CM | POA: Diagnosis not present

## 2016-08-07 DIAGNOSIS — J329 Chronic sinusitis, unspecified: Secondary | ICD-10-CM | POA: Insufficient documentation

## 2016-08-07 MED ORDER — DOXYCYCLINE HYCLATE 100 MG PO TABS: 100.0000 mg | ORAL_TABLET | Freq: Two times a day (BID) | ORAL | 0 refills | Status: DC

## 2016-08-07 MED ORDER — BENZONATATE 200 MG PO CAPS: 200.0000 mg | ORAL_CAPSULE | Freq: Three times a day (TID) | ORAL | 0 refills | Status: DC | PRN

## 2016-08-07 NOTE — Assessment & Plan Note (Addendum)
Patient's exam and history consistent with acute maxillary sinusitis. Given the duration of symptoms, will treat as a bacterial infection. She has multiple allergies. -Doxycycline 100 mg twice a day prescribed. -Prescribed Tessalon pearls for cough. -Patient noted to have chest pressure over the last 4-5 days. I suspect this is secondary to URI/cough, however discussed that given her age and risk factors (hypertension, hyperlipidemia, and diabetes), it will be beneficial for her to have a chest pain workup in the emergency room. The patient notes that her husband is currently on chemotherapy and she has to pick him up in one hour. She notes there is no one else who can help. We discussed that oftentimes in women and diabetics, MI does not always present in a typical manner. She understands the risks of not going to the ED to have this further worked and will seek care if her symptoms change or worsen at all.

## 2016-08-07 NOTE — Patient Instructions (Signed)
I prescribed Tessalon pearls and antibiotics for your symptoms. If you note that your chest pressure worsens or changes, or you know you symptoms, please be evaluated immediately as heart issues can present differently in females and people who have diabetes. It is important for you to take care of yourself, in addition to your husband. If your symptoms are worsening instead of improving, please make an appointment with our clinic tomorrow.

## 2016-08-07 NOTE — Progress Notes (Signed)
Subjective: CC: congestion, cough HPI: Patient is a 75 y.o. female with a past medical history of allergic rhinitis, sleep apnea, solitary pulmonary nodule, type 2 diabetes, and asthma presenting to clinic today for a same day appt for congestion and cough.  Patient noted facial pain/congestion, sneezing, and coughing over the 2.5 weeks. She notes her symptoms have progressively gotten worse. She denies wheezing or SOB. She intemittently has sharp pain in R ear, no hearing issues.   She has pressure on her chest as well. She initially states chest heaviness is all the time which has been going on for the last 4-5 days, but later states it is intermittent. The chest pressure is centrally located and normally doesn't radiate.  Deep inspiration will sometimes make the pain go her to back. She denies any true pain. No radiation to the arm. No jaw claudication. Not worsening with exertion.   She used a diabetic cough syrup that has not helped much. She's allergic to codeine.  Hasn't used inhalers in quite some time.   She's has a h/o HTN, HLD, and T2DM. Denies any cardiac history, no h/o MI or cardiac workup. She states she feels like her chest pain is due to being cold    Her husband had pneumonia but is on chemotherapy.  Social History: former smoker  Flu Vaccine: reports she had flu vaccine at pharmacy in Nov 2017.    ROS: All other systems reviewed and are negative.  Past Medical History Patient Active Problem List   Diagnosis Date Noted  . Sinusitis 08/07/2016  . Iron deficiency anemia due to chronic blood loss 10/31/2015  . Right upper quadrant abdominal pain 08/22/2015  . Ruptured thoracic disc 07/26/2015  . Spinal stenosis of lumbar region 07/10/2015  . Abdominal aortic atherosclerosis ([place]) 07/10/2015  . Wrist pain, acute 06/02/2015  . Strain of AC joint 06/02/2015  . Memory loss 05/02/2015  . SLEEP APNEA 05/22/2010  . PERIPHERAL NEUROPATHY 02/06/2010  . PULMONARY  NODULE, SOLITARY 07/20/2009  . Irritable bowel syndrome 11/12/2007  . Non-insulin dependent type 2 diabetes mellitus ([place]) 10/08/2006  . HYPERCHOLESTEROLEMIA 10/08/2006  . HYPERTRIGLYCERIDEMIA 10/08/2006  . Reaction, adjustment, with anxious, depressed mood 10/08/2006  . MIGRAINE, UNSPEC., W/O INTRACTABLE MIGRAINE 10/08/2006  . CARPAL TUNNEL SYNDROME 10/08/2006  . HYPERTENSION, BENIGN SYSTEMIC 10/08/2006  . RHINITIS, ALLERGIC 10/08/2006  . ASTHMA, PERSISTENT, MODERATE 10/08/2006  . REFLUX ESOPHAGITIS 10/08/2006  . ECZEMA, ATOPIC DERMATITIS 10/08/2006  . PSORIASIS 10/08/2006  . Osteoarthritis of spine 10/08/2006  . VERTIGO NOS OR DIZZINESS 10/08/2006    Medications- reviewed and updated  Objective: Office vital signs reviewed. BP (!) 160/94   Pulse 92   Temp 98.3 F (36.8 C) (Oral)   Wt 175 lb (79.4 kg)   SpO2 94%   BMI (P) 34.18 kg/m    Physical Examination:  General: Awake, alert, well- nourished, NAD Tenderness to palpation over the maxillary sinuses. ENMT: Left TM intact, normal light reflex, no erythema, no bulging. R TM intact, small amount of clear fluid with minimal fluid.  Nasal turbinates moist. MMM, Oropharynx clear without erythema.  Eyes: Conjunctiva non-injected. PERRL.  Cardio: RRR, no m/r/g noted. Pain not reproducible with palpation.  Pulm: No increased WOB.  CTAB, without wheezes, rhonchi or crackles noted.  Extremities: WWP, No edema, cyanosis or clubbing  EKG: Normal sinus rhythm, heart rate 80, T-wave inversion in aVR and V1, stable from previous EKG.   Assessment/Plan: Sinusitis Patient's exam and history consistent with acute maxillary sinusitis. Given  the duration of symptoms, will treat as a bacterial infection. She has multiple allergies. -Doxycycline 100 mg twice a day prescribed. -Prescribed Tessalon pearls for cough. -Patient noted to have chest pressure over the last 4-5 days. I suspect this is secondary to URI/cough, however discussed that  given her age and risk factors (hypertension, hyperlipidemia, and diabetes), it will be beneficial for her to have a chest pain workup in the emergency room. The patient notes that her husband is currently on chemotherapy and she has to pick him up in one hour. She notes there is no one else who can help. We discussed that oftentimes in women and diabetics, MI does not always present in a typical manner. She understands the risks of not going to the ED to have this further worked and will seek care if her symptoms change or worsen at all.    Orders Placed This Encounter  Procedures  . EKG 12-Lead    Meds ordered this encounter  Medications  . benzonatate (TESSALON) 200 MG capsule    Sig: Take 1 capsule (200 mg total) by mouth 3 (three) times daily as needed for cough.    Dispense:  20 capsule    Refill:  0  . doxycycline (VIBRA-TABS) 100 MG tablet    Sig: Take 1 tablet (100 mg total) by mouth 2 (two) times daily.    Dispense:  20 tablet    Refill:  [place] PGY-3, [place]

## 2016-08-08 ENCOUNTER — Other Ambulatory Visit: Payer: Self-pay | Admitting: Family Medicine

## 2016-08-15 ENCOUNTER — Telehealth: Payer: Self-pay | Admitting: Family Medicine

## 2016-08-15 ENCOUNTER — Other Ambulatory Visit (INDEPENDENT_AMBULATORY_CARE_PROVIDER_SITE_OTHER): Payer: PPO

## 2016-08-15 DIAGNOSIS — R300 Dysuria: Secondary | ICD-10-CM

## 2016-08-15 LAB — POCT URINALYSIS DIPSTICK
Bilirubin, UA: NEGATIVE
Blood, UA: NEGATIVE
Glucose, UA: NEGATIVE
Ketones, UA: NEGATIVE
Nitrite, UA: NEGATIVE
Protein, UA: NEGATIVE
Spec Grav, UA: 1.015
Urobilinogen, UA: 0.2
pH, UA: 5.5

## 2016-08-15 LAB — POCT UA - MICROSCOPIC ONLY

## 2016-08-15 NOTE — Telephone Encounter (Signed)
Taking doxycycline for 1 week for a sinus infection.  Frequency, right back pain and pressure with voiding.  No fever.  Asked to come in for a lab visit to provide a clean catch urine.  I will treat based on results.

## 2016-08-15 NOTE — Telephone Encounter (Signed)
Will forward to [employer] in lab. Derl Barrow, RN

## 2016-08-15 NOTE — Telephone Encounter (Signed)
Pt called because she thinks she might have a kidney infection from the antibiotics she is taking. Can we call something in and also call the patient for more details. jw

## 2016-08-15 NOTE — Telephone Encounter (Signed)
Will forward to PCP for advising. Nat Christen, CMA

## 2016-08-16 MED ORDER — CEPHALEXIN 500 MG PO CAPS: 500.0000 mg | ORAL_CAPSULE | Freq: Two times a day (BID) | ORAL | 0 refills | Status: AC

## 2016-08-16 NOTE — Addendum Note (Signed)
Addended by: Zenia Resides on: 08/16/2016 09:50 AM   Modules accepted: Orders

## 2016-09-04 ENCOUNTER — Telehealth: Payer: Self-pay | Admitting: Family Medicine

## 2016-09-04 MED ORDER — FLUCONAZOLE 150 MG PO TABS: ORAL_TABLET | ORAL | 0 refills | Status: DC

## 2016-09-04 NOTE — Telephone Encounter (Signed)
Will forward to MD. Jazmin Hartsell,CMA  

## 2016-09-04 NOTE — Telephone Encounter (Signed)
Pt states she is getting a yeast infection and would like something called in for it to [facility]. ep

## 2016-09-04 NOTE — Telephone Encounter (Signed)
Called and she has sx of yeast vaginitis.

## 2016-09-16 ENCOUNTER — Other Ambulatory Visit: Payer: Self-pay | Admitting: Family Medicine

## 2016-09-16 ENCOUNTER — Other Ambulatory Visit: Payer: Self-pay | Admitting: *Deleted

## 2016-09-16 MED ORDER — ENALAPRIL MALEATE 10 MG PO TABS: ORAL_TABLET | ORAL | 3 refills | Status: DC

## 2016-09-29 LAB — HM DIABETES EYE EXAM

## 2016-10-06 ENCOUNTER — Encounter: Payer: Self-pay | Admitting: Family Medicine

## 2016-10-20 ENCOUNTER — Other Ambulatory Visit: Payer: Self-pay | Admitting: Family Medicine

## 2016-10-28 DIAGNOSIS — H1045 Other chronic allergic conjunctivitis: Secondary | ICD-10-CM | POA: Diagnosis not present

## 2016-10-28 DIAGNOSIS — H35373 Puckering of macula, bilateral: Secondary | ICD-10-CM | POA: Diagnosis not present

## 2016-10-28 DIAGNOSIS — E119 Type 2 diabetes mellitus without complications: Secondary | ICD-10-CM | POA: Diagnosis not present

## 2016-11-06 ENCOUNTER — Ambulatory Visit (INDEPENDENT_AMBULATORY_CARE_PROVIDER_SITE_OTHER): Payer: PPO | Admitting: Family Medicine

## 2016-11-06 ENCOUNTER — Encounter: Payer: Self-pay | Admitting: Family Medicine

## 2016-11-06 VITALS — BP 112/70 | HR 84 | Temp 97.9°F | Ht 60.0 in | Wt 177.8 lb

## 2016-11-06 DIAGNOSIS — G43909 Migraine, unspecified, not intractable, without status migrainosus: Secondary | ICD-10-CM

## 2016-11-06 DIAGNOSIS — E119 Type 2 diabetes mellitus without complications: Secondary | ICD-10-CM

## 2016-11-06 DIAGNOSIS — I10 Essential (primary) hypertension: Secondary | ICD-10-CM

## 2016-11-06 DIAGNOSIS — D500 Iron deficiency anemia secondary to blood loss (chronic): Secondary | ICD-10-CM | POA: Diagnosis not present

## 2016-11-06 DIAGNOSIS — E7800 Pure hypercholesterolemia, unspecified: Secondary | ICD-10-CM

## 2016-11-06 DIAGNOSIS — G4730 Sleep apnea, unspecified: Secondary | ICD-10-CM | POA: Diagnosis not present

## 2016-11-06 LAB — POCT GLYCOSYLATED HEMOGLOBIN (HGB A1C): Hemoglobin A1C: 6

## 2016-11-06 MED ORDER — TRAMADOL HCL 50 MG PO TABS
50.0000 mg | ORAL_TABLET | Freq: Three times a day (TID) | ORAL | 3 refills | Status: DC | PRN
Start: 2016-11-06 — End: 2017-08-26

## 2016-11-06 NOTE — Progress Notes (Signed)
   Subjective:    Patient ID: [name], female    DOB: 1941/03/28, 76 y.o.   MRN: [id number]  HPI [name] would like a thorough med review because she has been very sleepy.  She stopped her baclofen on her own and that made her feel better.  Wonders if she can stop other meds.  Sleeps 7-8 hours per night and still has excessive daytime sleepiness.  No snoring or other symptoms suggesting sleep apnea.  I have full list of meds including OTC meds. Other issues: Due for HgbA1C and foot check.  A1C=6.  No hypoglycemic spells.   Recent worsening of headaches.  She plans to return to headache center.  Worsening occurred before she stopped baclofen.  Has not been using tramadol, only tylenol for HA. HPDP.  Had colonoscopy last year, we do not have records.    Review of Systems     Objective:   Physical Exam BP lowish. Wt steady Lungs clear Cardiac RRR Neuro, motor and sensory grossly intact.       Assessment & Plan:

## 2016-11-06 NOTE — Assessment & Plan Note (Signed)
Denies symptoms and does not think contributing to daytime sleepiness

## 2016-11-06 NOTE — Assessment & Plan Note (Signed)
Due for labs.  Tolerating simvastatin well.

## 2016-11-06 NOTE — Assessment & Plan Note (Signed)
Worsening. Agree with return to HA center.  I believe atenolol was for hypertension, not migraine prophylaxis.  Will stop and watch for effects on HA

## 2016-11-06 NOTE — Assessment & Plan Note (Signed)
Does not appear pale. Check CBC to see if contributing to fatigue.

## 2016-11-06 NOTE — Assessment & Plan Note (Signed)
Great control without hypoglycemia.  Continue Rx

## 2016-11-06 NOTE — Progress Notes (Signed)
Patient ID: [name], female   DOB: 1941/02/28, 76 y.o.   MRN: [id number]

## 2016-11-06 NOTE — Patient Instructions (Signed)
The only change for now is to stop the atenolol.  Things to watch after you stop: Are you less sleepy? Follow you blood pressure, does it go up? Do your migraine headaches get worse. I will call next week and we can decide if we need to make other changes.   Try the tramadol for your migraines.

## 2016-11-07 LAB — CMP14+EGFR
ALT: 24 IU/L (ref 0–32)
AST: 41 IU/L — ABNORMAL HIGH (ref 0–40)
Albumin/Globulin Ratio: 1.9 (ref 1.2–2.2)
Albumin: 4.7 g/dL (ref 3.5–4.8)
Alkaline Phosphatase: 69 IU/L (ref 39–117)
BUN/Creatinine Ratio: 19 (ref 12–28)
BUN: 20 mg/dL (ref 8–27)
Bilirubin Total: 0.4 mg/dL (ref 0.0–1.2)
CO2: 23 mmol/L (ref 18–29)
Calcium: 10.1 mg/dL (ref 8.7–10.3)
Chloride: 102 mmol/L (ref 96–106)
Creatinine, Ser: 1.07 mg/dL — ABNORMAL HIGH (ref 0.57–1.00)
GFR calc Af Amer: 59 mL/min/{1.73_m2} — ABNORMAL LOW (ref >59)
GFR calc non Af Amer: 51 mL/min/{1.73_m2} — ABNORMAL LOW (ref >59)
Globulin, Total: 2.5 g/dL (ref 1.5–4.5)
Glucose: 61 mg/dL — ABNORMAL LOW (ref 65–99)
Potassium: 4.5 mmol/L (ref 3.5–5.2)
Sodium: 143 mmol/L (ref 134–144)
Total Protein: 7.2 g/dL (ref 6.0–8.5)

## 2016-11-07 LAB — CBC
Hematocrit: 38 % (ref 34.0–46.6)
Hemoglobin: 12.3 g/dL (ref 11.1–15.9)
MCH: 28.8 pg (ref 26.6–33.0)
MCHC: 32.4 g/dL (ref 31.5–35.7)
MCV: 89 fL (ref 79–97)
Platelets: 185 10*3/uL (ref 150–379)
RBC: 4.27 x10E6/uL (ref 3.77–5.28)
RDW: 16.2 % — ABNORMAL HIGH (ref 12.3–15.4)
WBC: 5.9 10*3/uL (ref 3.4–10.8)

## 2016-11-07 LAB — LIPID PANEL
Chol/HDL Ratio: 3 ratio units (ref 0.0–4.4)
Cholesterol, Total: 170 mg/dL (ref 100–199)
HDL: 56 mg/dL (ref >39)
LDL Calculated: 85 mg/dL (ref 0–99)
Triglycerides: 146 mg/dL (ref 0–149)
VLDL Cholesterol Cal: 29 mg/dL (ref 5–40)

## 2016-11-07 LAB — TSH: TSH: 3.16 u[IU]/mL (ref 0.450–4.500)

## 2016-11-12 ENCOUNTER — Other Ambulatory Visit: Payer: Self-pay | Admitting: Family Medicine

## 2016-11-12 ENCOUNTER — Telehealth: Payer: Self-pay | Admitting: Family Medicine

## 2016-11-12 MED ORDER — ONETOUCH VERIO W/DEVICE KIT: 1.0000 | PACK | Freq: Every day | 0 refills | Status: DC

## 2016-11-12 NOTE — Telephone Encounter (Signed)
Pt called because when she was here last week she forgot to tell the doctor about her headaches. She has these occasionally but they are very bad. She hear sounds and it sounds like a basketball being bounced in her head. She would like to speak to her doctor about this. jw

## 2016-11-12 NOTE — Telephone Encounter (Signed)
Called again.  I had called earlier today to give lab results.  She senses a sound like a basketball bouncing.  We discussed and pulsation/hearing pulse was closest I could come up with.  Does not change my management.

## 2016-11-13 ENCOUNTER — Telehealth: Payer: Self-pay | Admitting: Family Medicine

## 2016-11-13 NOTE — Telephone Encounter (Signed)
Pt called because she she has received a new glucometer. She now has the one touch vero , she did pick that up but we send to the fax prescriptions for her lancet's and strip's. jw

## 2016-11-14 MED ORDER — GLUCOSE BLOOD VI STRP: ORAL_STRIP | 12 refills | Status: DC

## 2016-11-14 MED ORDER — ONETOUCH ULTRASOFT LANCETS MISC: 12 refills | Status: DC

## 2016-11-18 ENCOUNTER — Ambulatory Visit (INDEPENDENT_AMBULATORY_CARE_PROVIDER_SITE_OTHER): Payer: PPO | Admitting: Internal Medicine

## 2016-11-18 ENCOUNTER — Encounter: Payer: Self-pay | Admitting: Internal Medicine

## 2016-11-18 VITALS — BP 125/80 | HR 113 | Temp 98.5°F | Ht 60.0 in | Wt 178.6 lb

## 2016-11-18 DIAGNOSIS — J040 Acute laryngitis: Secondary | ICD-10-CM | POA: Diagnosis not present

## 2016-11-18 DIAGNOSIS — B9789 Other viral agents as the cause of diseases classified elsewhere: Secondary | ICD-10-CM

## 2016-11-18 DIAGNOSIS — J329 Chronic sinusitis, unspecified: Secondary | ICD-10-CM

## 2016-11-18 DIAGNOSIS — J209 Acute bronchitis, unspecified: Secondary | ICD-10-CM | POA: Diagnosis not present

## 2016-11-18 MED ORDER — BENZONATATE 200 MG PO CAPS: 200.0000 mg | ORAL_CAPSULE | Freq: Three times a day (TID) | ORAL | 0 refills | Status: DC | PRN

## 2016-11-18 MED ORDER — IPRATROPIUM BROMIDE 0.06 % NA SOLN: 2.0000 | Freq: Four times a day (QID) | NASAL | 0 refills | Status: DC

## 2016-11-18 NOTE — Progress Notes (Signed)
   [facility] Phone: [phone]   Date of Visit: 11/18/2016   HPI:  [name] is a 76 y.o. female presenting to clinic today for same day appointment. PCP: Zigmund Gottron, MD Concerns today include:  - reports that on Friday, she was caring for her great grandchildren (80mo and 2yo) who had a double ear infection, vomiting and diarrhea.  - on Sunday patient started having diarrhea (non-bloody) which stopped with imodium x 4 doses with last dose Sunday  - she then started having epigastric pain which resolved Sunday  - then she started having throat pain, right ear pain, pain with swallowing, nasal congestion and sinus congestion, and nonproductive cough. No fevers. Reports of some chills. Reports of some intermittent shortness of breath yesterday but not today.  - she is eating and drinking - has tried [employer] which helped with cough but she ran out today  - she has also tried salt water gargles and throat lozenges which have not helped - former smoker; last smoke in the 70s   ROS: See HPI.  [place]:  Migraine HTN Abdominal Aortic Atherosclerosis Allergic Rhinitis Solitary Pulmonary Nodule Sleep Apnea Reflux Esophagitis  IBS DM2 Peripheral Neuropathy  HLD Asthma Persistent Moderate Vertigo    PHYSICAL EXAM: BP 125/80 (BP Location: Left Arm, Patient Position: Sitting, Cuff Size: Normal)   Pulse (!) 113   Temp 98.5 F (36.9 C) (Oral)   Ht 5' (1.524 m)   Wt 178 lb 9.6 oz (81 kg)   SpO2 97%   BMI 34.88 kg/m  GEN: NAD, nontoxic HEENT: Atraumatic, normocephalic, neck supple and without lymphadenopathy , EOMI, sclera clear, nasal turbinates mildly erythematous. pharynx with mild erythema otherwise unremarkable and uvula midline. Mild sinus tenderness to palpation on the right frontal and maxillary area. TMs normal bilaterally,   CV: RRR, no murmurs, rubs, or gallops PULM: CTAB, normal effort ABD: Soft, nontender, nondistended,  NABS, no organomegaly SKIN: No rash or cyanosis; warm and well-perfused EXTR: No lower extremity edema or calf tenderness PSYCH: Mood and affect euthymic, normal rate and volume of speech NEURO: Awake, alert, no focal deficits grossly, normal speech   ASSESSMENT/PLAN: Laryngitis Acute bronchitis, unspecified organism Viral Sinusitis: Symptoms consistent with viral etiology (bronchitis, sinusitis). Symptoms for the past three days; no antibiotic indicated currently for sinusitis. Lung exam is unremarkable and normal oxygen saturation. Unlikely PNA. Symptomatic tx with Tessalon PRN and Atrovent nasal spray.  - return precautions discussed   Smiley Houseman, MD PGY [place]

## 2016-11-18 NOTE — Addendum Note (Signed)
Addended by: Smiley Houseman on: 11/18/2016 06:18 PM   Modules accepted: Level of Service

## 2016-11-18 NOTE — Patient Instructions (Signed)
Your symptoms are likely due to a viral upper respiratory infection. You can take Tessalon as needed for cough and atrovent nasal spray for your nasal congestion. We will get a chest x-ray today as well; please go to [facility] for this. Please call if your sinus symptoms persist after 7 days.

## 2016-11-19 ENCOUNTER — Other Ambulatory Visit: Payer: Self-pay | Admitting: Family Medicine

## 2016-11-20 ENCOUNTER — Telehealth: Payer: Self-pay | Admitting: Family Medicine

## 2016-11-20 ENCOUNTER — Ambulatory Visit: Payer: PPO | Admitting: *Deleted

## 2016-11-20 MED ORDER — AZITHROMYCIN 250 MG PO TABS: ORAL_TABLET | ORAL | 0 refills | Status: DC

## 2016-11-20 NOTE — Addendum Note (Signed)
Addended by: Zenia Resides on: 11/20/2016 11:22 AM   Modules accepted: Orders

## 2016-11-20 NOTE — Telephone Encounter (Signed)
Pt is calling because she is not getting any better at all. She would like to see if the doctor can call in an antibiotic for her. She said that the pearls are not helping much and would like to know can she take a OTC cough syrup with the pearls? Please call to discuss other options. jw

## 2016-11-20 NOTE — Telephone Encounter (Signed)
Called.  Cough, congestion and throat pain worse, not better.  While still likely viral, will rx with azithro.

## 2016-11-24 ENCOUNTER — Telehealth: Payer: Self-pay | Admitting: Family Medicine

## 2016-11-24 DIAGNOSIS — J209 Acute bronchitis, unspecified: Secondary | ICD-10-CM

## 2016-11-24 NOTE — Telephone Encounter (Signed)
Will forward to MD to advise. Jazmin Hartsell,CMA  

## 2016-11-24 NOTE — Telephone Encounter (Signed)
Pt is calling back again and was wondering when the doctor was going to call her. jw

## 2016-11-24 NOTE — Telephone Encounter (Signed)
Pt states the cough still has not gone away. Pt is out of the tessalon. Pt also has a yeast infection from the antibiotics and is constantly wet due to bladder leakage from cough. Pt would like to know what PCP wants her to do. Pt declined to make an appointment at this time. Pt uses [facility]. ep

## 2016-11-25 ENCOUNTER — Ambulatory Visit
Admission: RE | Admit: 2016-11-25 | Discharge: 2016-11-25 | Disposition: A | Discharge status: Home / Self Care | Payer: PPO | Source: Ambulatory Visit | Attending: Family Medicine | Admitting: Family Medicine

## 2016-11-25 DIAGNOSIS — R05 Cough: Secondary | ICD-10-CM | POA: Diagnosis not present

## 2016-11-25 DIAGNOSIS — J209 Acute bronchitis, unspecified: Secondary | ICD-10-CM

## 2016-11-25 MED ORDER — TERCONAZOLE 0.4 % VA CREA: 1.0000 | TOPICAL_CREAM | Freq: Every day | VAGINAL | 2 refills | Status: DC

## 2016-11-25 MED ORDER — BENZONATATE 200 MG PO CAPS: 200.0000 mg | ORAL_CAPSULE | Freq: Three times a day (TID) | ORAL | 0 refills | Status: DC | PRN

## 2016-11-25 NOTE — Assessment & Plan Note (Signed)
Persistant cough.  No fever.  Will check CXR to insure not missing important pathology.  Likely post viral cough.

## 2016-12-08 ENCOUNTER — Other Ambulatory Visit: Payer: Self-pay | Admitting: Family Medicine

## 2016-12-08 ENCOUNTER — Telehealth: Payer: Self-pay | Admitting: Family Medicine

## 2016-12-08 DIAGNOSIS — N6320 Unspecified lump in the left breast, unspecified quadrant: Secondary | ICD-10-CM

## 2016-12-08 DIAGNOSIS — T8543XA Leakage of breast prosthesis and implant, initial encounter: Secondary | ICD-10-CM | POA: Insufficient documentation

## 2016-12-08 NOTE — Telephone Encounter (Signed)
Ordered diagnostic mammo

## 2016-12-08 NOTE — Assessment & Plan Note (Signed)
Patient found.  Will order diagnostic mammo.

## 2016-12-08 NOTE — Telephone Encounter (Signed)
Pt found a lump in her left breast. She called the breast center but was told since she wasn't due for a mammogram, she needed a drs referral.  Pt wants to know if dr Andria Frames wants to see her first OR will he do the referral? Please advise

## 2016-12-09 ENCOUNTER — Other Ambulatory Visit: Payer: Self-pay | Admitting: Family Medicine

## 2016-12-09 DIAGNOSIS — T8543XS Leakage of breast prosthesis and implant, sequela: Secondary | ICD-10-CM

## 2016-12-11 ENCOUNTER — Ambulatory Visit (INDEPENDENT_AMBULATORY_CARE_PROVIDER_SITE_OTHER): Payer: PPO | Admitting: Internal Medicine

## 2016-12-11 ENCOUNTER — Encounter: Payer: Self-pay | Admitting: Internal Medicine

## 2016-12-11 VITALS — BP 122/78 | HR 102 | Temp 98.2°F | Ht 60.0 in | Wt 176.2 lb

## 2016-12-11 DIAGNOSIS — N3000 Acute cystitis without hematuria: Secondary | ICD-10-CM

## 2016-12-11 DIAGNOSIS — J029 Acute pharyngitis, unspecified: Secondary | ICD-10-CM | POA: Diagnosis not present

## 2016-12-11 DIAGNOSIS — R8290 Unspecified abnormal findings in urine: Secondary | ICD-10-CM | POA: Diagnosis not present

## 2016-12-11 LAB — POCT UA - MICROSCOPIC ONLY

## 2016-12-11 LAB — POCT URINALYSIS DIP (MANUAL ENTRY)
Bilirubin, UA: NEGATIVE
Blood, UA: NEGATIVE
Glucose, UA: NEGATIVE mg/dL
Ketones, POC UA: NEGATIVE mg/dL
Nitrite, UA: POSITIVE — AB
Protein Ur, POC: NEGATIVE mg/dL
Spec Grav, UA: 1.01 (ref 1.010–1.025)
Urobilinogen, UA: 0.2 E.U./dL
pH, UA: 6 (ref 5.0–8.0)

## 2016-12-11 MED ORDER — CIPROFLOXACIN HCL 250 MG PO TABS: 250.0000 mg | ORAL_TABLET | Freq: Two times a day (BID) | ORAL | 0 refills | Status: DC

## 2016-12-11 NOTE — Patient Instructions (Addendum)
You have a UTI. I have prescribed Ciprofloxacin to treat this UTI. I will also get a culture to make sure the infection is sensitive to this antibiotic.   Please follow up with Dr. Andria Frames for an ENT referral.

## 2016-12-11 NOTE — Progress Notes (Signed)
Subjective:    [name] - 76 y.o. female MRN [id number]  Date of birth: 06-Jan-1941  HPI  [name] is here for sore throat and cloudy urine.  Sore Throat: Has been present for >1 month and has been worsening. Reports recent history of viral illness that persisted and was treated for bacterial bronchitis with a Z pack. Throat pain has persisted throughout and has acutely worsened over the past few days. Endorsing nasal congestion and left ear pain as well. Had pain with swallowing this morning and looked in her throat and saw white spots. After eating bread, the white spots were gone but she thought she saw some blood in her throat afterwards. No recent sick contacts. Afebrile at home.   Cloudy Urine: Has been present for a couple days and is also having trouble initiating a stream of urine. No dysuria. Feels like she has to use the bathroom frequently. No abdominal pain, nausea, or vomiting. No vaginal discharge.   -  reports that she quit smoking about 25 years ago. Her smoking use included Cigarettes. She started smoking about 59 years ago. She has a 66.00 pack-year smoking history. She has never used smokeless tobacco. - Review of Systems: Per HPI. - Past Medical History: Patient Active Problem List   Diagnosis Date Noted  . Ruptured silicone breast implant 12/08/2016  . Sinusitis 08/07/2016  . Iron deficiency anemia due to chronic blood loss 10/31/2015  . Right upper quadrant abdominal pain 08/22/2015  . Ruptured thoracic disc 07/26/2015  . Spinal stenosis of lumbar region 07/10/2015  . Abdominal aortic atherosclerosis ([place]) 07/10/2015  . Wrist pain, acute 06/02/2015  . Strain of AC joint 06/02/2015  . Acute bronchitis 12/15/2012  . Dysuria 04/04/2011  . Sleep apnea 05/22/2010  . PERIPHERAL NEUROPATHY 02/06/2010  . PULMONARY NODULE, SOLITARY 07/20/2009  . Irritable bowel syndrome 11/12/2007  . Non-insulin dependent type 2 diabetes mellitus ([place]) 10/08/2006  .  HYPERCHOLESTEROLEMIA 10/08/2006  . HYPERTRIGLYCERIDEMIA 10/08/2006  . Reaction, adjustment, with anxious, depressed mood 10/08/2006  . Migraine headache 10/08/2006  . CARPAL TUNNEL SYNDROME 10/08/2006  . HYPERTENSION, BENIGN SYSTEMIC 10/08/2006  . RHINITIS, ALLERGIC 10/08/2006  . ASTHMA, PERSISTENT, MODERATE 10/08/2006  . REFLUX ESOPHAGITIS 10/08/2006  . ECZEMA, ATOPIC DERMATITIS 10/08/2006  . PSORIASIS 10/08/2006  . Osteoarthritis of spine 10/08/2006  . VERTIGO NOS OR DIZZINESS 10/08/2006   - Medications: reviewed and updated   Objective:   Physical Exam BP 122/78   Pulse (!) 102   Temp 98.2 F (36.8 C) (Oral)   Ht 5' (1.524 m)   Wt 176 lb 3.2 oz (79.9 kg)   SpO2 95%   BMI 34.41 kg/m  Gen: NAD, alert, cooperative with exam, well-appearing HEENT: oropharynx non-erythematous, tonsils with small exudates bilaterally, TMs normal bilaterally with good light reflexes, turbinates swollen bilaterally L > R, no LAD or appreciable neck masses  CV: RRR, good S1/S2, no murmur, no edema, capillary refill brisk  Resp: CTABL, no wheezes, non-labored Abd: SNTND, BS present, no guarding or organomegaly, no CVA tenderness     Assessment & Plan:   1. Acute cystitis without hematuria UA consistent with UTI given TNTC WBCs, many bacteria, positive nitrites, and moderate leukocytes. Will treat for uncomplicated cystitis with Ciprofloxacin x3 days. Choose this antibiotic due to numerous allergies. Last EKG with Qt prolongation. Previous urine culture was pan-sensitive. Will follow up urine culture collected this visit and change therapy as needed.  - POCT urinalysis dipstick - POCT UA - Microscopic Only - Urine  culture  2. Sore throat Differential remains broad. Concern for cryptic tonsillitis given findings of exudates on exam and chronic pain. Less suspicious for bacterial cause given chronic nature of pain. If there is a bacterial component, patient may benefit from antibiotic therapy  prescribed for UTI. Patient does have a remote smoking history but quit >20 years ago so malignancy is less likely. Patient does have history of allergic rhinitis and is currently adherent to daily nasal steroid. May consider addition of oral anti-histamine as this may be related to postnasal drip. Patient seen by PCP Dr. Andria Frames at this visit. Elected to hold off on ENT referral to see if improves after treatment for UTI. Will defer to PCP for further work up/management.    Phill Myron, D.O. 12/11/2016, 3:01 PM PGY-2, [place]

## 2016-12-12 ENCOUNTER — Other Ambulatory Visit: Payer: PPO

## 2016-12-12 ENCOUNTER — Ambulatory Visit
Admission: RE | Admit: 2016-12-12 | Discharge: 2016-12-12 | Disposition: A | Discharge status: Home / Self Care | Payer: PPO | Source: Ambulatory Visit | Attending: Family Medicine | Admitting: Family Medicine

## 2016-12-12 DIAGNOSIS — N6320 Unspecified lump in the left breast, unspecified quadrant: Secondary | ICD-10-CM

## 2016-12-12 DIAGNOSIS — N6489 Other specified disorders of breast: Secondary | ICD-10-CM | POA: Diagnosis not present

## 2016-12-12 DIAGNOSIS — R928 Other abnormal and inconclusive findings on diagnostic imaging of breast: Secondary | ICD-10-CM | POA: Diagnosis not present

## 2016-12-13 ENCOUNTER — Other Ambulatory Visit: Payer: Self-pay | Admitting: Family Medicine

## 2016-12-13 DIAGNOSIS — E7800 Pure hypercholesterolemia, unspecified: Secondary | ICD-10-CM

## 2016-12-13 LAB — URINE CULTURE

## 2016-12-16 ENCOUNTER — Telehealth: Payer: Self-pay | Admitting: *Deleted

## 2016-12-16 NOTE — Telephone Encounter (Signed)
Pt informed of below.  She stated that she still fills like she is not urinating normal and I asked if she wanted to schedule an appointment to come in and let them recheck her and she said she would give it a few more days and then will call if she needs to be seen.  Katharina Caper, April D, [place]

## 2016-12-16 NOTE — Telephone Encounter (Signed)
-----   Message from Nicolette Bang, DO sent at 12/16/2016  8:51 AM EDT ----- Please call patient and let her know that urine culture grew E. Coli sensitive to Ciprofloxacin, the antibiotic I prescribed. No need to change therapy.   Phill Myron, D.O. 12/16/2016, 8:51 AM PGY-2, [facility]

## 2016-12-23 ENCOUNTER — Telehealth: Payer: Self-pay | Admitting: Family Medicine

## 2016-12-23 DIAGNOSIS — J029 Acute pharyngitis, unspecified: Secondary | ICD-10-CM

## 2016-12-23 NOTE — Telephone Encounter (Signed)
Would like referral to ENT.

## 2016-12-23 NOTE — Assessment & Plan Note (Signed)
Sore throat, off and on for over a month.  See our office visits of 11/18/16 and 12/11/16.  No with sore throat and white spots again.  Will get ENT referral

## 2016-12-25 DIAGNOSIS — K219 Gastro-esophageal reflux disease without esophagitis: Secondary | ICD-10-CM | POA: Diagnosis not present

## 2016-12-25 DIAGNOSIS — J358 Other chronic diseases of tonsils and adenoids: Secondary | ICD-10-CM | POA: Diagnosis not present

## 2016-12-25 DIAGNOSIS — H9313 Tinnitus, bilateral: Secondary | ICD-10-CM | POA: Diagnosis not present

## 2016-12-25 DIAGNOSIS — H903 Sensorineural hearing loss, bilateral: Secondary | ICD-10-CM | POA: Diagnosis not present

## 2017-01-09 IMAGING — CR DG CHEST 2V
2 series · 2 of 2 positions shown · non-contrast
Comparison: AC joints 09/17/2010

CLINICAL DATA: Sharp pain under the right shoulder blade tonight.

EXAM:
CHEST  2 VIEW

[chest pa]
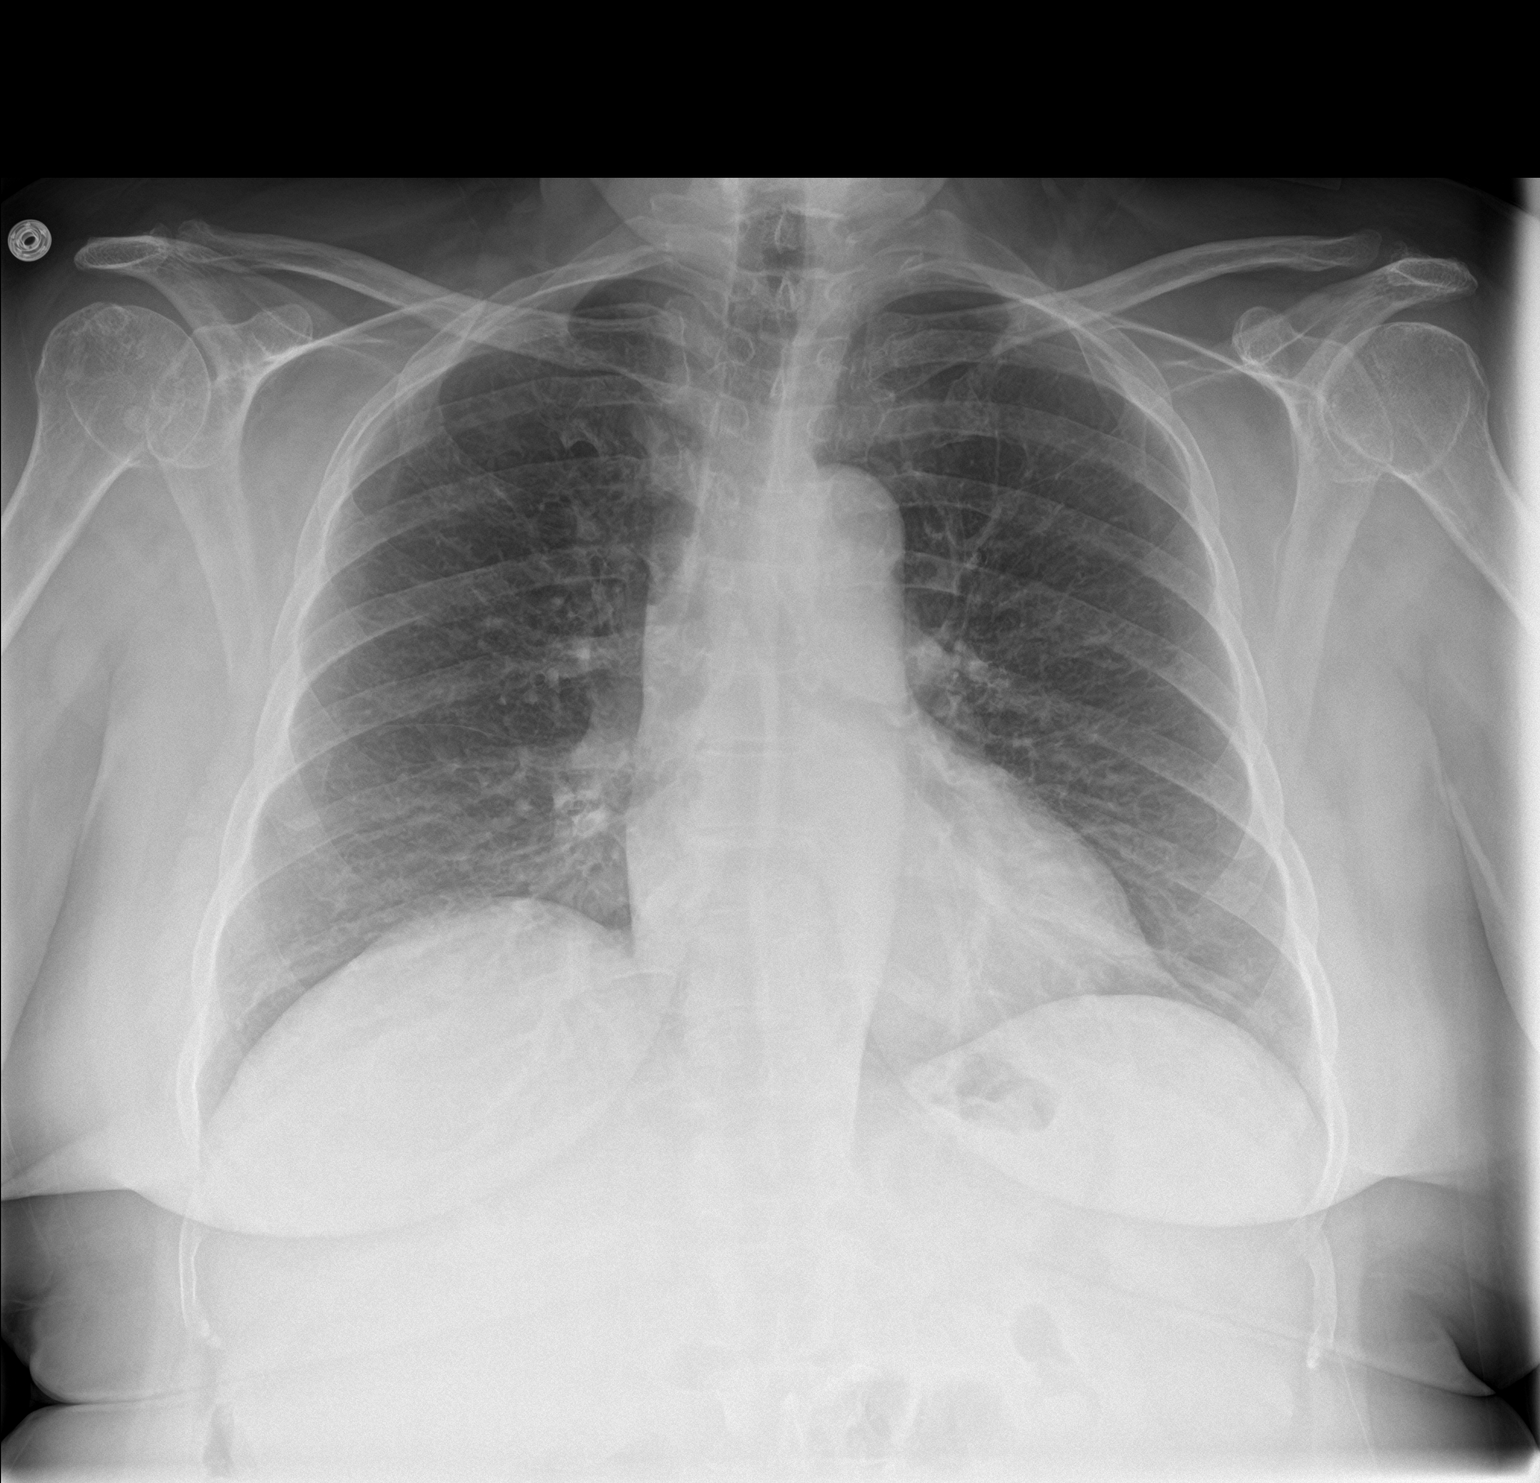

[chest lat]
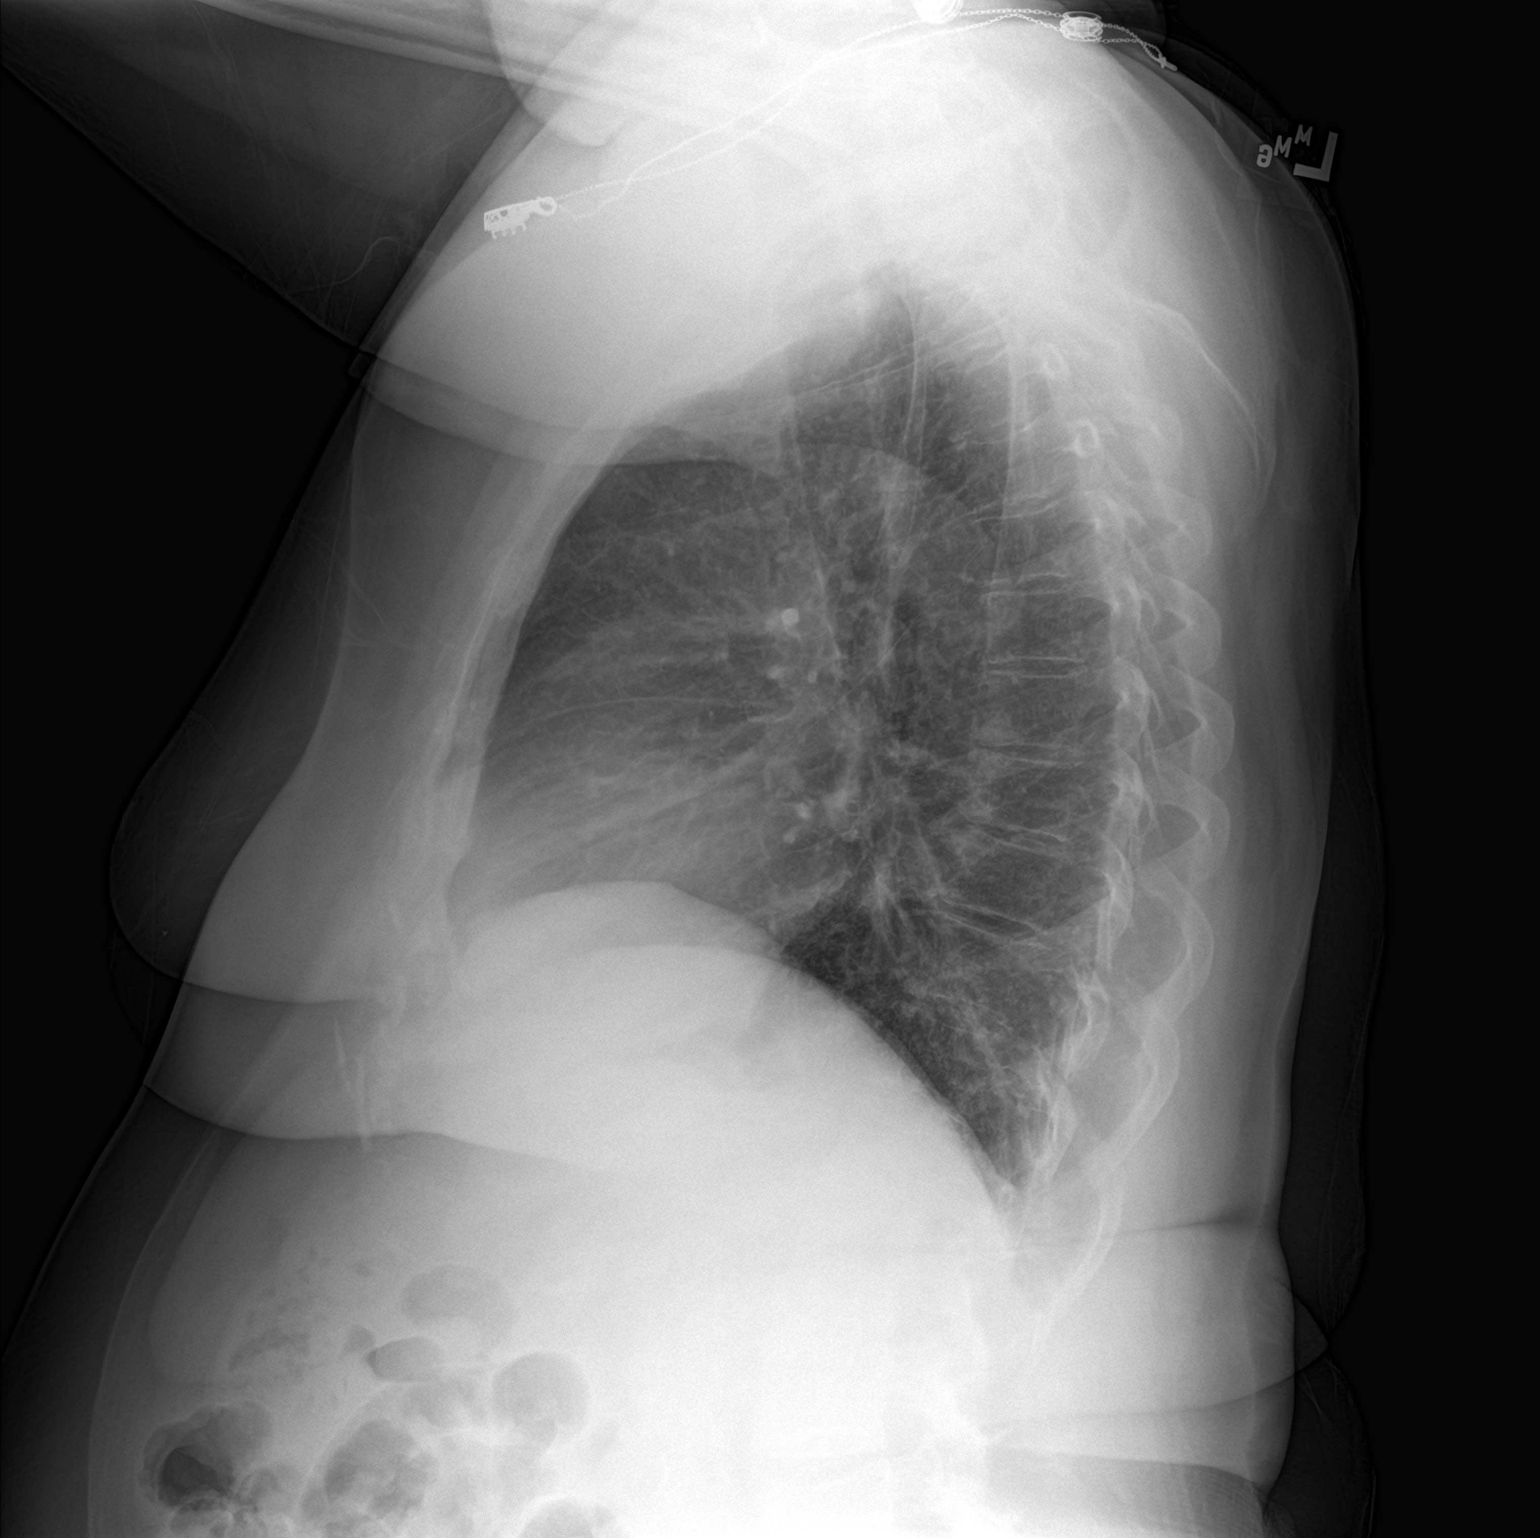

[2 of 2 positions shown; findings below may reference images not displayed]

FINDINGS: The heart size and mediastinal contours are within normal limits.
Both lungs are clear. The visualized skeletal structures are
unremarkable.
IMPRESSION: No active cardiopulmonary disease.

## 2017-01-31 ENCOUNTER — Other Ambulatory Visit: Payer: Self-pay | Admitting: Family Medicine

## 2017-02-16 DIAGNOSIS — M722 Plantar fascial fibromatosis: Secondary | ICD-10-CM | POA: Diagnosis not present

## 2017-02-16 DIAGNOSIS — M76822 Posterior tibial tendinitis, left leg: Secondary | ICD-10-CM | POA: Diagnosis not present

## 2017-03-25 ENCOUNTER — Ambulatory Visit: Payer: PPO | Admitting: Family Medicine

## 2017-03-26 ENCOUNTER — Encounter: Payer: Self-pay | Admitting: Family Medicine

## 2017-03-26 ENCOUNTER — Ambulatory Visit (INDEPENDENT_AMBULATORY_CARE_PROVIDER_SITE_OTHER): Payer: PPO | Admitting: Family Medicine

## 2017-03-26 VITALS — BP 136/60 | HR 82 | Temp 98.0°F | Ht 60.0 in | Wt 176.4 lb

## 2017-03-26 DIAGNOSIS — E119 Type 2 diabetes mellitus without complications: Secondary | ICD-10-CM | POA: Diagnosis not present

## 2017-03-26 DIAGNOSIS — D500 Iron deficiency anemia secondary to blood loss (chronic): Secondary | ICD-10-CM | POA: Diagnosis not present

## 2017-03-26 DIAGNOSIS — F4320 Adjustment disorder, unspecified: Secondary | ICD-10-CM | POA: Diagnosis not present

## 2017-03-26 DIAGNOSIS — F32A Depression, unspecified: Secondary | ICD-10-CM | POA: Insufficient documentation

## 2017-03-26 DIAGNOSIS — F4323 Adjustment disorder with mixed anxiety and depressed mood: Secondary | ICD-10-CM | POA: Insufficient documentation

## 2017-03-26 DIAGNOSIS — F4321 Adjustment disorder with depressed mood: Secondary | ICD-10-CM

## 2017-03-26 LAB — POCT GLYCOSYLATED HEMOGLOBIN (HGB A1C): Hemoglobin A1C: 5.7

## 2017-03-26 NOTE — Patient Instructions (Addendum)
I am so sorry for your loss.  It sounds like you are handling things remarkably well. See me three months. Please schedule an annual wellness exam with my nurse at your convenience.   Please call the day before you get the blood drawn.

## 2017-03-26 NOTE — Assessment & Plan Note (Signed)
Great control, no change. 

## 2017-03-26 NOTE — Assessment & Plan Note (Signed)
Due for CBC  

## 2017-03-26 NOTE — Assessment & Plan Note (Signed)
>>  ASSESSMENT AND PLAN FOR ADJUSTMENT REACTION WITH ANXIETY AND DEPRESSION WRITTEN ON 03/26/2017  5:09 PM BY HENSEL, ELSIE LABOR, MD  Amazing loss of three sibs within one month (plus sick husband.)  Equally amazing, she is doing well.

## 2017-03-26 NOTE — Assessment & Plan Note (Signed)
Amazing loss of three sibs within one month (plus sick husband.)  Equally amazing, she is doing well.
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
## 2017-04-02 ENCOUNTER — Other Ambulatory Visit: Payer: PPO

## 2017-04-02 DIAGNOSIS — D500 Iron deficiency anemia secondary to blood loss (chronic): Secondary | ICD-10-CM

## 2017-04-03 LAB — CBC
Hematocrit: 37.4 % (ref 34.0–46.6)
Hemoglobin: 11.9 g/dL (ref 11.1–15.9)
MCH: 27.8 pg (ref 26.6–33.0)
MCHC: 31.8 g/dL (ref 31.5–35.7)
MCV: 87 fL (ref 79–97)
Platelets: 180 10*3/uL (ref 150–379)
RBC: 4.28 x10E6/uL (ref 3.77–5.28)
RDW: 16.2 % — ABNORMAL HIGH (ref 12.3–15.4)
WBC: 6.2 10*3/uL (ref 3.4–10.8)

## 2017-04-06 ENCOUNTER — Other Ambulatory Visit: Payer: Self-pay | Admitting: Family Medicine

## 2017-04-06 DIAGNOSIS — E119 Type 2 diabetes mellitus without complications: Secondary | ICD-10-CM

## 2017-04-09 DIAGNOSIS — M722 Plantar fascial fibromatosis: Secondary | ICD-10-CM | POA: Diagnosis not present

## 2017-04-09 DIAGNOSIS — M76829 Posterior tibial tendinitis, unspecified leg: Secondary | ICD-10-CM | POA: Diagnosis not present

## 2017-04-10 ENCOUNTER — Telehealth: Payer: Self-pay | Admitting: *Deleted

## 2017-04-10 DIAGNOSIS — R112 Nausea with vomiting, unspecified: Secondary | ICD-10-CM

## 2017-04-10 MED ORDER — ONDANSETRON HCL 4 MG PO TABS: 4.0000 mg | ORAL_TABLET | Freq: Three times a day (TID) | ORAL | 3 refills | Status: DC | PRN

## 2017-04-10 NOTE — Telephone Encounter (Signed)
Patient called requesting to speak with provider regarding her lab results.  She has not heard anything. Please give her a call at [phone].  Derl Barrow, RN

## 2017-04-10 NOTE — Addendum Note (Signed)
Addended by: Zenia Resides on: 04/10/2017 10:56 AM   Modules accepted: Orders

## 2017-04-10 NOTE — Telephone Encounter (Signed)
Given results.  Asks for refill on ondansetron.  Given.

## 2017-05-01 ENCOUNTER — Telehealth: Payer: Self-pay | Admitting: Family Medicine

## 2017-05-01 NOTE — Telephone Encounter (Signed)
Pt hast UTI with a lot  Of pressure, could dr Andria Frames call in an antiboitic to [facility]?

## 2017-05-02 ENCOUNTER — Telehealth: Payer: Self-pay | Admitting: Internal Medicine

## 2017-05-02 NOTE — Telephone Encounter (Signed)
**  After Hours/ Emergency Line Call*  Received a call to report that [name].  - Patient reports that she has symptoms of UTI and was wondering if we would be able to call in an antibiotic for her.  - she reports that she was not able to go to sleep due to her symptoms - she reports that he has been having a difficult time urinating.  - I discussed with her that we do not prescribe antibiotics prior to evaluating patient and obtaining the appropriate labs - I advised her to go urgent care to be evaluated. She agreed.    Will forward to PCP.  Smiley Houseman, MD PGY-3, [facility]

## 2017-05-04 ENCOUNTER — Telehealth: Payer: Self-pay | Admitting: Family Medicine

## 2017-05-04 ENCOUNTER — Ambulatory Visit (INDEPENDENT_AMBULATORY_CARE_PROVIDER_SITE_OTHER): Payer: PPO | Admitting: Student

## 2017-05-04 ENCOUNTER — Encounter: Payer: Self-pay | Admitting: Student

## 2017-05-04 VITALS — BP 146/78 | HR 110 | Temp 98.6°F | Wt 173.0 lb

## 2017-05-04 DIAGNOSIS — J209 Acute bronchitis, unspecified: Secondary | ICD-10-CM

## 2017-05-04 DIAGNOSIS — R300 Dysuria: Secondary | ICD-10-CM | POA: Diagnosis not present

## 2017-05-04 LAB — POCT URINALYSIS DIP (MANUAL ENTRY)
Bilirubin, UA: NEGATIVE
Glucose, UA: NEGATIVE mg/dL
Ketones, POC UA: NEGATIVE mg/dL
Nitrite, UA: NEGATIVE
Protein Ur, POC: NEGATIVE mg/dL
Spec Grav, UA: 1.01 (ref 1.010–1.025)
Urobilinogen, UA: 0.2 E.U./dL
pH, UA: 7 (ref 5.0–8.0)

## 2017-05-04 LAB — POCT UA - MICROSCOPIC ONLY

## 2017-05-04 MED ORDER — ALBUTEROL SULFATE HFA 108 (90 BASE) MCG/ACT IN AERS: 2.0000 | INHALATION_SPRAY | Freq: Four times a day (QID) | RESPIRATORY_TRACT | 0 refills | Status: DC | PRN

## 2017-05-04 MED ORDER — BENZONATATE 200 MG PO CAPS: 200.0000 mg | ORAL_CAPSULE | Freq: Three times a day (TID) | ORAL | 0 refills | Status: DC | PRN

## 2017-05-04 MED ORDER — LEVOFLOXACIN 750 MG PO TABS: 750.0000 mg | ORAL_TABLET | Freq: Every day | ORAL | 0 refills | Status: DC

## 2017-05-04 NOTE — Assessment & Plan Note (Signed)
Symptoms suggestive for acute bronchitis. She reports history of sinus congestion prior to this.  I also wonder if there is underlying COPD but exam without increased work of breathing, wheezing or crackles. Oxygen saturation 93% on room air. This appears to be her baseline. She may benefit from PFT going forward.  Cannot rule out CAP. Cardiac etiology is unlikely. She has waves a score of 1.5 (tachycardia). Tachycardia could be due to infectious process versus DVT or PE. She has no other risk factor for PE or DVT. Will treat with Levaquin for 5 days. Refilled Tessalon. Gave a prescription for albuterol inhaler. Discussed return precautions.

## 2017-05-04 NOTE — Telephone Encounter (Signed)
Pt is concerned about a new medication that was prescribed today by Dr. Cyndia Skeeters. She read all the literature that came with her prescription and she is allergic to so many different medications. Plus some of the side effects are a little scary. Please call and discuss her prescription of Levaquin with her, jw

## 2017-05-04 NOTE — Telephone Encounter (Signed)
She was seen in [facility] today.

## 2017-05-04 NOTE — Progress Notes (Signed)
Subjective:    [name] is a 76 y.o. old female here SDA for UTI and cough  HPI UTI: she reports pain all the time, pressure, vulvar irritation all the time, urge, increase frequency of urination. These symptoms have been going for three days. She reports having similar issues in the past. Denies vaginal discharge or bleeding but the vaginal hurts. Not sexually active. Report fever to 100.64F yesterday. Denies new back pain. Denies nausea or vomiting.   Cough: she has been coughing for two weeks. She has been using tessalon 200 mg daily. She ran out of this. Cough is productive with greenish phlegm. Denies blood in it. Reports chest pain with deep breath. She couldn't describe the chest pain. She say she has to take short breathes. It hurts in her back as well. She has chest pain for two weeks. Denies change. She also reports wheezing and shortness of breath. She reports fatigue. Denies history of COPD but has history of asthma. Denies history of heart disease. Denies orthopnea, PND or leg edema. Denies recent travel or flight. Denies history of cancer or recent immobilization. She has history of thrombophlebitis about twenty years ago.   She denies runny nose or cold symptoms leading to this but admits pain in sinus areas leading to this.   Quit smoking in 1979. She smoked 2 pack a day for 18 years.   PMH/Problem List: has Non-insulin dependent type 2 diabetes mellitus ([place]); HYPERCHOLESTEROLEMIA; HYPERTRIGLYCERIDEMIA; Reaction, adjustment, with anxious, depressed mood; Migraine headache; CARPAL TUNNEL SYNDROME; PERIPHERAL NEUROPATHY; HYPERTENSION, BENIGN SYSTEMIC; RHINITIS, ALLERGIC; ASTHMA, PERSISTENT, MODERATE; PULMONARY NODULE, SOLITARY; REFLUX ESOPHAGITIS; Irritable bowel syndrome; ECZEMA, ATOPIC DERMATITIS; PSORIASIS; Osteoarthritis of spine; VERTIGO NOS OR DIZZINESS; Sleep apnea; Dysuria; Acute bronchitis; Wrist pain, acute; Strain of AC joint; Spinal stenosis of lumbar region; Abdominal aortic  atherosclerosis ([place]); Ruptured thoracic disc; Right upper quadrant abdominal pain; Iron deficiency anemia due to chronic blood loss; Sinusitis; Ruptured silicone breast implant; Pharyngitis; and Grief on her problem list.   has a past medical history of Asthma; Hypertension; and Migraine.  FH:  Family History  Problem Relation Age of Onset  . Hearing loss Sister   . Heart attack Sister 85       s/p 4 bypasses   . Stroke Brother        26  . Breast cancer Maternal Aunt     SH Social History  Substance Use Topics  . Smoking status: Former Smoker    Packs/day: 2.00    Years: 33.00    Types: Cigarettes    Start date: 08/11/1957    Quit date: 04/04/1991  . Smokeless tobacco: Never Used  . Alcohol use No    Review of Systems Review of systems negative except for pertinent positives and negatives in history of present illness above.     Objective:     Vitals:   05/04/17 1115  BP: (!) 146/78  Pulse: (!) 110  Temp: 98.6 F (37 C)  TempSrc: Oral  SpO2: 93%  Weight: 173 lb (78.5 kg)   Body mass index is 33.79 kg/m.  Physical Exam GEN: appears well, no apparent distress. Head: normocephalic and atraumatic  Eyes: conjunctiva without injection, sclera anicteric Oropharynx: mmm without erythema or exudation CVS: RRR, nl S1&S2, no murmurs, no edema RESP: no IWOB, good air movement bilaterally, CTAB GI: BS present & normal, soft, NTND GU: no suprapubic or CVA tenderness MSK: no focal tenderness or notable swelling. Homans sign negative.  SKIN: no apparent skin lesion NEURO: alert  and oiented appropriately, no gross deficits  PSYCH: euthymic mood with congruent affect    Assessment and Plan:  1. Dysuria: history and urinalysis concerning for UTI. Symptoms are acute for the last 3 days. Unlikely pyelonephritis without CVA tenderness. Overall, she appears well. Others on differential diagnosis are vaginal atrophy but unlikely given the acute nature of his symptoms. Will treat  with Levaquin for 5 days. This could take a of her bronchitis as well. She has no QTC prolongation. Patient is allergic to many antibiotics. - levofloxacin (LEVAQUIN) 750 MG tablet; Take 1 tablet (750 mg total) by mouth daily.  Dispense: 5 tablet; Refill: 0 - Urine Culture  2. Acute bronchitis, unspecified organism: symptoms suggestive for acute bronchitis. She reports history of sinus congestion prior to this.  I also wonder if there is underlying COPD but exam without increased work of breathing, wheezing or crackles. Oxygen saturation 93% on room air. This appears to be her baseline. She may benefit from PFT going forward.  Cannot rule out CAP. Cardiac etiology is unlikely. She has waves a score of 1.5 (tachycardia). Tachycardia could be due to infectious process versus DVT or PE. She has no other risk factor for PE or DVT. Will treat with Levaquin for 5 days. Refilled Tessalon. Gave a prescription for albuterol inhaler. Discussed return precautions.   Return if symptoms worsen or fail to improve.  Mercy Riding, MD 05/04/17 Pager: [phone]

## 2017-05-04 NOTE — Telephone Encounter (Signed)
Called.  Patient has safely taken cipro multiple times in the past.  I recommended that it should be quite safe.  She was reassured by my comments.

## 2017-05-04 NOTE — Patient Instructions (Signed)
It was great seeing you today! We have addressed the following issues today 1. UTI: we sent a prescription for Levaquin to your pharmacy. Please take this medication for 5 days. 2.         Cough: this is likely due to bronchitis. Levaquin should take care of this. We have also sent a prescription for albuterol inhaler and Tessalon to your pharmacy. Please come back and see [place] if no improvement in his symptoms or if you have other symptoms concerning to you.  If we did any lab work today, and the results require attention, either me or my nurse will get in touch with you. If everything is normal, you will get a letter in mail and a message via . If you don't hear from [place] in two weeks, please give [place] a call. Otherwise, we look forward to seeing you again at your next visit. If you have any questions or concerns before then, please call the clinic at [phone].  Please bring all your medications to every doctors visit  Sign up for My Chart to have easy access to your labs results, and communication with your Primary care physician.    Please check-out at the front desk before leaving the clinic.    Take Care,   Dr. Cyndia Skeeters   Acute Bronchitis, Adult Acute bronchitis is when air tubes (bronchi) in the lungs suddenly get swollen. The condition can make it hard to breathe. It can also cause these symptoms:  A cough.  Coughing up clear, yellow, or green mucus.  Wheezing.  Chest congestion.  Shortness of breath.  A fever.  Body aches.  Chills.  A sore throat.  Follow these instructions at home: Medicines  Take over-the-counter and prescription medicines only as told by your doctor.  If you were prescribed an antibiotic medicine, take it as told by your doctor. Do not stop taking the antibiotic even if you start to feel better. General instructions  Rest.  Drink enough fluids to keep your pee (urine) clear or pale yellow.  Avoid smoking and secondhand smoke. If you smoke  and you need help quitting, ask your doctor. Quitting will help your lungs heal faster.  Use an inhaler, cool mist vaporizer, or humidifier as told by your doctor.  Keep all follow-up visits as told by your doctor. This is important. How is this prevented? To lower your risk of getting this condition again:  Wash your hands often with soap and water. If you cannot use soap and water, use hand sanitizer.  Avoid contact with people who have cold symptoms.  Try not to touch your hands to your mouth, nose, or eyes.  Make sure to get the flu shot every year.  Contact a doctor if:  Your symptoms do not get better in 2 weeks. Get help right away if:  You cough up blood.  You have chest pain.  You have very bad shortness of breath.  You become dehydrated.  You faint (pass out) or keep feeling like you are going to pass out.  You keep throwing up (vomiting).  You have a very bad headache.  Your fever or chills gets worse. This information is not intended to replace advice given to you by your health care provider. Make sure you discuss any questions you have with your health care provider. Document Released: 01/14/2008 Document Revised: 03/05/2016 Document Reviewed: 01/16/2016 Elsevier Interactive Patient Education  2017 [employer].

## 2017-05-04 NOTE — Telephone Encounter (Signed)
She was seen in [facility] today.

## 2017-05-04 NOTE — Assessment & Plan Note (Signed)
History and urinalysis concerning for UTI. Symptoms are acute for the last 3 days. Unlikely pyelonephritis without CVA tenderness. Overall, she appears well. Others on differential diagnosis are vaginal atrophy but unlikely given the acute nature of his symptoms. Will treat with Levaquin for 5 days. This could take a of her bronchitis as well. She has no QTC prolongation. Patient is allergic to many antibiotics. - levofloxacin (LEVAQUIN) 750 MG tablet; Take 1 tablet (750 mg total) by mouth daily.  Dispense: 5 tablet; Refill: 0 - Urine Culture

## 2017-05-06 LAB — URINE CULTURE

## 2017-05-08 ENCOUNTER — Telehealth: Payer: Self-pay | Admitting: Family Medicine

## 2017-05-08 NOTE — Telephone Encounter (Signed)
Pt is calling and would like to speak to Dr. Andria Frames about something that is going on. She didn't really want to go into details with me. jw

## 2017-05-11 NOTE — Telephone Encounter (Signed)
Called.  Still feels bad.  Asked about med side effects.  Answered questions.  Suggested a visit next week.  Unclear etiology, but suspect it is stress from her husband's illness.

## 2017-05-20 ENCOUNTER — Telehealth: Payer: Self-pay | Admitting: *Deleted

## 2017-05-20 NOTE — Telephone Encounter (Signed)
Patient called stating she is having severe abdominal cramping after eating. Pain started about 1.5 week ago after completing antibiotics.  Patient denies vomiting, fever, complained of constipation and nausea.  Advised patient that she should be seen in clinic for the abdominal pain. Patient has an appointment on 06/17/2017 with PCP, however advised patient that she should be seen by a different provider sooner, but patient stated she wanted to see her PCP.   She also wanted to speak with PCP. Advised patient that PCP would most likely want her to be seen ASAP even if by a different provider.  Pt still requested to speak with PCP first. Please give her a call on her cell phone.    Derl Barrow, RN

## 2017-05-21 ENCOUNTER — Other Ambulatory Visit: Payer: Self-pay | Admitting: Family Medicine

## 2017-05-21 NOTE — Addendum Note (Signed)
Addended by: Zenia Resides on: 05/21/2017 11:54 AM   Modules accepted: Orders

## 2017-05-21 NOTE — Telephone Encounter (Signed)
Called and discussed.  Lower abd pain.  Eating prunes and not constipated.  No fever, urinary sx or wt loss.  No bleeding.  Started with levofloxacin, which she has been done with for some time.  Also severe stress with husband's cancer. Recommended yogurt/probiotic.  Has appointment to see me Nov 7.  No need to see earlier unless worsens.

## 2017-06-17 ENCOUNTER — Ambulatory Visit: Payer: PPO | Admitting: Family Medicine

## 2017-06-17 ENCOUNTER — Encounter: Payer: Self-pay | Admitting: Family Medicine

## 2017-06-17 VITALS — BP 124/72 | HR 98 | Temp 98.2°F | Wt 174.4 lb

## 2017-06-17 DIAGNOSIS — M75102 Unspecified rotator cuff tear or rupture of left shoulder, not specified as traumatic: Secondary | ICD-10-CM

## 2017-06-17 DIAGNOSIS — E119 Type 2 diabetes mellitus without complications: Secondary | ICD-10-CM

## 2017-06-17 DIAGNOSIS — B3731 Acute candidiasis of vulva and vagina: Secondary | ICD-10-CM

## 2017-06-17 DIAGNOSIS — B373 Candidiasis of vulva and vagina: Secondary | ICD-10-CM

## 2017-06-17 LAB — POCT GLYCOSYLATED HEMOGLOBIN (HGB A1C): Hemoglobin A1C: 5.4

## 2017-06-17 MED ORDER — FLUCONAZOLE 150 MG PO TABS: ORAL_TABLET | ORAL | 0 refills | Status: DC

## 2017-06-17 MED ORDER — METFORMIN HCL 1000 MG PO TABS: 500.0000 mg | ORAL_TABLET | Freq: Two times a day (BID) | ORAL | 3 refills | Status: DC

## 2017-06-17 NOTE — Patient Instructions (Signed)
I sent in the prescription for the yeast infection Remember to do the shoulder exercises that we went over If the shoulder is still hurting, see me for a cortisone shot.  Look up rotator cuff syndrome.   Make an appointment with my nurse Lauren for an annual medicare wellness visit. See me in three months if things are going well. Cut back to 1/2 pill per day of the metformin.

## 2017-06-19 ENCOUNTER — Encounter: Payer: Self-pay | Admitting: Family Medicine

## 2017-06-19 DIAGNOSIS — M75102 Unspecified rotator cuff tear or rupture of left shoulder, not specified as traumatic: Secondary | ICD-10-CM | POA: Insufficient documentation

## 2017-06-19 NOTE — Assessment & Plan Note (Signed)
Exercises given.  Return for cortisone shot prn.

## 2017-06-19 NOTE — Progress Notes (Signed)
   Subjective:    Patient ID: [name], female    DOB: 1941/04/05, 76 y.o.   MRN: [id number]  HPI  FU DM and other problems. 1. DM.  Home blood sugars great.  Two low readings and she was symptomatic.  A1Ctoday=5.4 On metformin 1,000 mg bid. 2. Recently treated with antibiotic for bladder infection.  Now has pruritic vaginal discahrge.   3. Left shoulder discomfort with lifting arm Present x 3 weeks. 4. Due for annual medicare wellness visit.   Review of Systems     Objective:   Physical Exam left shoulder Good rOM but pain on abduction and external rotation.         Assessment & Plan:

## 2017-06-19 NOTE — Assessment & Plan Note (Signed)
Well controled, perhaps too well.  Decrease metformin.  Recheck 3 months.

## 2017-06-19 NOTE — Assessment & Plan Note (Signed)
Will treat presumptively.

## 2017-06-25 ENCOUNTER — Ambulatory Visit: Payer: PPO | Admitting: *Deleted

## 2017-07-07 DIAGNOSIS — M48062 Spinal stenosis, lumbar region with neurogenic claudication: Secondary | ICD-10-CM | POA: Diagnosis not present

## 2017-07-07 DIAGNOSIS — M5114 Intervertebral disc disorders with radiculopathy, thoracic region: Secondary | ICD-10-CM | POA: Diagnosis not present

## 2017-07-07 DIAGNOSIS — M5124 Other intervertebral disc displacement, thoracic region: Secondary | ICD-10-CM | POA: Diagnosis not present

## 2017-07-07 DIAGNOSIS — M5416 Radiculopathy, lumbar region: Secondary | ICD-10-CM | POA: Diagnosis not present

## 2017-07-15 DIAGNOSIS — M5124 Other intervertebral disc displacement, thoracic region: Secondary | ICD-10-CM | POA: Diagnosis not present

## 2017-07-15 DIAGNOSIS — Z6834 Body mass index (BMI) 34.0-34.9, adult: Secondary | ICD-10-CM | POA: Diagnosis not present

## 2017-07-15 DIAGNOSIS — R030 Elevated blood-pressure reading, without diagnosis of hypertension: Secondary | ICD-10-CM | POA: Diagnosis not present

## 2017-07-24 ENCOUNTER — Ambulatory Visit: Payer: PPO | Admitting: Family Medicine

## 2017-07-24 ENCOUNTER — Other Ambulatory Visit: Payer: Self-pay

## 2017-07-24 ENCOUNTER — Encounter: Payer: Self-pay | Admitting: Family Medicine

## 2017-07-24 ENCOUNTER — Other Ambulatory Visit: Payer: Self-pay | Admitting: Physical Medicine and Rehabilitation

## 2017-07-24 DIAGNOSIS — M545 Low back pain, unspecified: Secondary | ICD-10-CM

## 2017-07-24 DIAGNOSIS — M5124 Other intervertebral disc displacement, thoracic region: Secondary | ICD-10-CM

## 2017-07-24 DIAGNOSIS — M549 Dorsalgia, unspecified: Secondary | ICD-10-CM | POA: Insufficient documentation

## 2017-07-24 MED ORDER — GABAPENTIN 100 MG PO CAPS: 100.0000 mg | ORAL_CAPSULE | Freq: Three times a day (TID) | ORAL | 0 refills | Status: DC

## 2017-07-24 MED ORDER — HYDROCODONE-ACETAMINOPHEN 5-325 MG PO TABS: 1.0000 | ORAL_TABLET | Freq: Three times a day (TID) | ORAL | 0 refills | Status: DC | PRN

## 2017-07-24 NOTE — Progress Notes (Signed)
Subjective  Patient is presenting with the following illnesses  BACK PAIN For last 3 weeks a sharp shooting intermittent pain in Left mid back.  Had a nerve block from Dr Assunta Curtis office last week without relief.  Has not been able to contact his office.  Has had similar pains like this in past that responded to nerve blocks.  No fever or trauma or weakness or incontinence other than usual mild urinary. Taking robaxin and ibuprofen.   Chief Complaint noted Review of Symptoms - see HPI PMH - Smoking status noted.     Objective Vital Signs reviewed Alert mod distress Seems to have sharp shooting pain that cause her to jump up from chair or table Neurologic exam : Strength equal & normal in upper & lower extremities Able to walk on heels and toes.   Balance normal  Back - no midline tenderness.  Tender to light touch on left of thoracic spine.  No skin lesions      Assessments/Plans  Back pain New onset severe.  Seems consistent with nerve pain.  No evidence of zoster or weakness or fracture.  Has MRI scheduled.  Will treat with analgesics and monitor closely.  She has a number of allergies including to narcotics and gabapentin so cautioned her on these and to start slowly.  On stomach protection so will add moderate dose nsaid for temporary use.  See after visit summary    See after visit summary for details of patient instuctions

## 2017-07-24 NOTE — Patient Instructions (Addendum)
Good to see you today!  Thanks for coming in.  For the Pain  Ibuprofen 2-3 tabs up to three times a day as needed but no more Robaxin 500 mg three times a day Gabapentin 100 mg three times a day - try on capsule first to make sure does not cause confusion if it does not the go up to three times a day  Zostrix Capsacin cream rub on 4 times a day Norco tabs 1 up to three times a day as needed   I am giving you medicines that you could react but in your allergy list it was not clear what might happen just be aware of this  If you have fever or weakness or loss of bowel or bladder control that is new you need to go to the ER right away

## 2017-07-24 NOTE — Assessment & Plan Note (Signed)
New onset severe.  Seems consistent with nerve pain.  No evidence of zoster or weakness or fracture.  Has MRI scheduled.  Will treat with analgesics and monitor closely.  She has a number of allergies including to narcotics and gabapentin so cautioned her on these and to start slowly.  On stomach protection so will add moderate dose nsaid for temporary use.  See after visit summary

## 2017-07-28 ENCOUNTER — Other Ambulatory Visit: Payer: Self-pay | Admitting: Family Medicine

## 2017-07-28 MED ORDER — METFORMIN HCL 500 MG PO TABS: 500.0000 mg | ORAL_TABLET | Freq: Two times a day (BID) | ORAL | 3 refills | Status: DC

## 2017-08-02 ENCOUNTER — Ambulatory Visit
Admission: RE | Admit: 2017-08-02 | Discharge: 2017-08-02 | Disposition: A | Discharge status: Home / Self Care | Payer: PPO | Source: Ambulatory Visit | Attending: Physical Medicine and Rehabilitation | Admitting: Physical Medicine and Rehabilitation

## 2017-08-02 DIAGNOSIS — M5124 Other intervertebral disc displacement, thoracic region: Secondary | ICD-10-CM | POA: Diagnosis not present

## 2017-08-06 ENCOUNTER — Other Ambulatory Visit: Payer: Self-pay | Admitting: *Deleted

## 2017-08-06 ENCOUNTER — Ambulatory Visit: Payer: PPO | Admitting: *Deleted

## 2017-08-06 MED ORDER — HYDROCODONE-ACETAMINOPHEN 5-325 MG PO TABS: 1.0000 | ORAL_TABLET | Freq: Three times a day (TID) | ORAL | 0 refills | Status: DC | PRN

## 2017-08-06 NOTE — Telephone Encounter (Signed)
Pt lm on nurse line.  She is requesting a refill of pain meds because she is still having pain and does not go to see the nerve MD till 08/13/17. Hermelinda Diegel, Salome Spotted, CMA

## 2017-08-06 NOTE — Telephone Encounter (Signed)
Pt informed. Alynah Schone Dawn, CMA  

## 2017-08-06 NOTE — Telephone Encounter (Signed)
Dear Dema Severin Team I reviewed her note.  I refilled the hydrocodone.  She should follow-up with Dr. Andria Frames when he gets back next week.  I have sent the prescription for the hydrocodone to her pharmacy via electronic prescription prescribing. Please let her know THANKS! Dorcas Mcmurray

## 2017-08-13 DIAGNOSIS — M5114 Intervertebral disc disorders with radiculopathy, thoracic region: Secondary | ICD-10-CM | POA: Diagnosis not present

## 2017-08-13 DIAGNOSIS — I10 Essential (primary) hypertension: Secondary | ICD-10-CM | POA: Diagnosis not present

## 2017-08-13 DIAGNOSIS — M5124 Other intervertebral disc displacement, thoracic region: Secondary | ICD-10-CM | POA: Diagnosis not present

## 2017-08-13 DIAGNOSIS — Z6834 Body mass index (BMI) 34.0-34.9, adult: Secondary | ICD-10-CM | POA: Diagnosis not present

## 2017-08-18 ENCOUNTER — Encounter: Payer: Self-pay | Admitting: Internal Medicine

## 2017-08-18 ENCOUNTER — Other Ambulatory Visit: Payer: Self-pay

## 2017-08-18 ENCOUNTER — Ambulatory Visit (INDEPENDENT_AMBULATORY_CARE_PROVIDER_SITE_OTHER): Payer: PPO | Admitting: Internal Medicine

## 2017-08-18 VITALS — BP 160/100 | HR 102 | Temp 98.3°F | Ht 60.0 in | Wt 174.0 lb

## 2017-08-18 DIAGNOSIS — G8929 Other chronic pain: Secondary | ICD-10-CM | POA: Diagnosis not present

## 2017-08-18 DIAGNOSIS — B372 Candidiasis of skin and nail: Secondary | ICD-10-CM | POA: Diagnosis not present

## 2017-08-18 DIAGNOSIS — M546 Pain in thoracic spine: Secondary | ICD-10-CM | POA: Diagnosis not present

## 2017-08-18 MED ORDER — CLOTRIMAZOLE 1 % EX OINT: 1.0000 "application " | TOPICAL_OINTMENT | Freq: Two times a day (BID) | CUTANEOUS | 1 refills | Status: DC

## 2017-08-18 MED ORDER — LIDOCAINE 4 % EX CREA: 1.0000 "application " | TOPICAL_CREAM | CUTANEOUS | 0 refills | Status: DC | PRN

## 2017-08-18 NOTE — Progress Notes (Signed)
[facility] Progress Note  Subjective:  [name] is a 77 y.o. female with history of HTN, T2DM, chronic back pain who presents for rash of abdomen. She also reports ongoing back pain.  #Rash: - Present for last 2 weeks. Began on left side of lower abdomen but spread to across entire fold of panniculus - Started to have some oozing over the last week and has been placing gauze in area to help with drainage and irritation - Is worried this could be shingles because some areas look like popped blisters - Denies fevers   #Back pain: - History of right thoracic back pain. Is seeing PCP and orthopedic surgeon for this. - Minimal improvement with nortriptyline and gabapentin. Orthopedic doctor recently increased her gabapentin and ordered lidocaine patch, but the latter is pending insurance approval. A capsaicin-containing cream helps a little, and she uses this 4 times a day. Says tramadol doesn't help much and wondering if she should try higher dose.  - Patient says she had shingles in this area in the past - Recent MRI showed R disc protrusion at T7-8 somewhat improved from prior.  - Pain contained to right mid back and does not radiate elsewhere   Allergies  Allergen Reactions  . Amoxicillin Rash  . Azithromycin Rash    Itching/rash  . Butalbital Other (See Comments)    Reaction: trouble breathing, but uncertain  . Clindamycin Hcl Rash  . Hydrocodone-Acetaminophen Rash    REACTION: developed rash  . Januvia [Sitagliptin] Other (See Comments)    Worsened migraines.  [place] [Levofloxacin In D5w] Diarrhea  . Meperidine Hcl Other (See Comments)    REACTION: itching and hallucinations  . Sulfamethoxazole Rash  . Ketorolac Swelling    Received ketorolac, methocarbamal, and lidocaine patch all at the same time.  Lip swelling 12 hours later  . Lidocaine Swelling    Received ketorolac, methocarbamal, and lidocaine patch all at the same time.  Lip swelling 12 hours  later  . Macrodantin [Nitrofurantoin Macrocrystal] Itching  . Methocarbamol Swelling    Received ketorolac, methocarbamal, and lidocaine patch all at the same time.  Lip swelling 12 hours later  . Codeine Phosphate Other (See Comments)    REACTION: unknown  . Etodolac Other (See Comments)    REACTION: stomach upset  . Gabapentin Other (See Comments)    REACTION: mental clouding  . Naproxen Other (See Comments)    REACTION: stomach upset  . Oxycodone Other (See Comments)    Reaction: Stomach upset  . Pentazocine-Naloxone Other (See Comments)    REACTION: unspecified  . Sumatriptan Other (See Comments)    REACTION: chest pain  . Zonegran Other (See Comments)    Reaction: Unknown    Social History   Tobacco Use  . Smoking status: Former Smoker    Packs/day: 2.00    Years: 33.00    Pack years: 66.00    Types: Cigarettes    Start date: 08/11/1957    Last attempt to quit: 04/04/1991    Years since quitting: 26.3  . Smokeless tobacco: Never Used  Substance Use Topics  . Alcohol use: No    Objective: Blood pressure (!) 160/100, pulse (!) 102, temperature 98.3 F (36.8 C), temperature source Oral, height 5' (1.524 m), weight 174 lb (78.9 kg), SpO2 97 %. Body mass index is 33.98 kg/m. Constitutional: Obese female in mild discomfort when changing position on exam table Abdominal: Soft. +BS. NT aside from area of skin irritation (see below). Musculoskeletal: TTP  with light and moderate touch over right thoracic paraspinal muscles. No midline spinal TTP. Skin: Patch of erythema extending across lower abdomen under panniculus. No drainage but areas of mild maceration.  Psychiatric: Anxious affect.  Vitals reviewed     Assessment/Plan: Candidal intertrigo - Given location and appearance, candidal intertrigo most likely diagnosis. No fever or active drainage to suggest cellulitis or superimposed infection. - Recommended clotrimazole ointment BID for about 2 weeks or until cleared.  Continue gauze as needed to keep skin from rubbing. May consider nystatin powder in future to prevent future episodes. - Continue to encourage weight loss. A1c well controlled at 5.4 two months ago.  Back pain - Description consistent with nerve pain. Patient reports shingles in the area in the past. Suspect hyperalgesia 2/2 post-herpetic neuralgia. No bowel or bladder incontinence and recent MRI without evidence of compression.  - Patient awaiting lidocaine patch insurance approval. Discussed why this is a better option than opioids. Ordered topical lidocaine in the meantime in case this will be covered by insurance. Patient says lidocaine allergy listed not a true allergy and thinks was secondary to muscle relaxant.   Follow-up already scheduled next week with PCP to discuss back pain further. Recheck BP at that time--discomfort during today's visit likely contributing to elevated reading.  Olene Floss, MD [place], PGY-3

## 2017-08-18 NOTE — Patient Instructions (Addendum)
[name],  You have candida intertrigo--yeast infection of the skin.  I recommend using clotrimazole antifungal ointment twice daily for the next 2 weeks. Once this clears, it may be worth trying nystatin powder for prevention. Avoid tight clothes.  For your back, continue nortriptyline, gabapentin, and capsaicin. I hope the lidocaine patch is approved soon.  Best, Dr. Ola Spurr

## 2017-08-19 ENCOUNTER — Encounter: Payer: Self-pay | Admitting: Internal Medicine

## 2017-08-19 DIAGNOSIS — L304 Erythema intertrigo: Secondary | ICD-10-CM | POA: Insufficient documentation

## 2017-08-19 MED ORDER — CLOTRIMAZOLE 1 % EX OINT: 1.0000 "application " | TOPICAL_OINTMENT | Freq: Two times a day (BID) | CUTANEOUS | 1 refills | Status: DC

## 2017-08-19 NOTE — Assessment & Plan Note (Signed)
-   Given location and appearance, candidal intertrigo most likely diagnosis. No fever or active drainage to suggest cellulitis or superimposed infection. - Recommended clotrimazole ointment BID for about 2 weeks or until cleared. Continue gauze as needed to keep skin from rubbing. May consider nystatin powder in future to prevent future episodes. - Continue to encourage weight loss. A1c well controlled at 5.4 two months ago.

## 2017-08-19 NOTE — Assessment & Plan Note (Signed)
-   Description consistent with nerve pain. Patient reports shingles in the area in the past. Suspect hyperalgesia 2/2 post-herpetic neuralgia. No bowel or bladder incontinence and recent MRI without evidence of compression.  - Patient awaiting lidocaine patch insurance approval. Discussed why this is a better option than opioids. Ordered topical lidocaine in the meantime in case this will be covered by insurance. Patient says lidocaine allergy listed not a true allergy and thinks was secondary to muscle relaxant.

## 2017-08-26 ENCOUNTER — Ambulatory Visit (INDEPENDENT_AMBULATORY_CARE_PROVIDER_SITE_OTHER): Payer: PPO | Admitting: Family Medicine

## 2017-08-26 ENCOUNTER — Encounter: Payer: Self-pay | Admitting: Family Medicine

## 2017-08-26 ENCOUNTER — Other Ambulatory Visit: Payer: Self-pay

## 2017-08-26 DIAGNOSIS — G43909 Migraine, unspecified, not intractable, without status migrainosus: Secondary | ICD-10-CM | POA: Diagnosis not present

## 2017-08-26 DIAGNOSIS — G8929 Other chronic pain: Secondary | ICD-10-CM

## 2017-08-26 DIAGNOSIS — M546 Pain in thoracic spine: Secondary | ICD-10-CM | POA: Diagnosis not present

## 2017-08-26 MED ORDER — TRAMADOL HCL 50 MG PO TABS: 50.0000 mg | ORAL_TABLET | Freq: Three times a day (TID) | ORAL | 3 refills | Status: DC | PRN

## 2017-08-26 NOTE — Patient Instructions (Signed)
There are no good answers for chronic pain.   My main advice is to get moving either via physical therapy or chiropracter. Also, we can steadily boost up your dose of gabapentin until it either has satisfactorially relieved much of your pain or you get sedated.   There is another medicine I can add - duloxetin/cymbalta.   See me in one month.

## 2017-08-26 NOTE — Assessment & Plan Note (Signed)
Steps will be to increase gabapentin. Get moving with chiropracter or physical therapy. Consider duloxetine. Cont tramadol

## 2017-08-27 ENCOUNTER — Encounter: Payer: Self-pay | Admitting: Family Medicine

## 2017-08-27 NOTE — Assessment & Plan Note (Signed)
We did discuss the analogy of migraines and her back pain both being chronic pains.  She has finally gotten her migraines under control via a lot of work and adjustments with the headache center.  This back pain will likely be a similar journey.  Also made the connection that stress may worsen.  Currently under considerable stress since husband not doing well with his lymphoma.

## 2017-08-27 NOTE — Progress Notes (Signed)
   Subjective:    Patient ID: [name], female    DOB: 15-Aug-1940, 77 y.o.   MRN: [id number]  HPI  FU Thoracic back pain.  Patient has had severe right post thoracic back pain for 2-3 months.  No SOB or chest pain.  Has had a similar episode previously ~3 years ago.  That episode was helped by a nerve block by [facility].  Seeing the similarity, she went back to [facility], had another nerve block, which did not help this time.  Neurosurg said options were limited and she comes to me to see what options I might have.  Being treated with gabapentin, dose recently increased.  Not having side effects.  I reviewed recent thoracic MRI, 08/02/17.  No obvious etiology of pain.     Review of Systems     Objective:   Physical Exam Hyperesthesia to the subscapular area, right thoracic.  Sits and walks a little tilted. Lungs clear Cardiac RRR without m or g.       Assessment & Plan:

## 2017-09-23 DIAGNOSIS — M5431 Sciatica, right side: Secondary | ICD-10-CM | POA: Diagnosis not present

## 2017-09-28 ENCOUNTER — Other Ambulatory Visit: Payer: Self-pay | Admitting: Family Medicine

## 2017-09-29 DIAGNOSIS — M5136 Other intervertebral disc degeneration, lumbar region: Secondary | ICD-10-CM | POA: Diagnosis not present

## 2017-09-29 DIAGNOSIS — M5416 Radiculopathy, lumbar region: Secondary | ICD-10-CM | POA: Diagnosis not present

## 2017-09-29 DIAGNOSIS — M545 Low back pain: Secondary | ICD-10-CM | POA: Diagnosis not present

## 2017-09-30 ENCOUNTER — Encounter: Payer: Self-pay | Admitting: Family Medicine

## 2017-09-30 ENCOUNTER — Other Ambulatory Visit: Payer: Self-pay

## 2017-09-30 ENCOUNTER — Ambulatory Visit (INDEPENDENT_AMBULATORY_CARE_PROVIDER_SITE_OTHER): Payer: PPO | Admitting: Family Medicine

## 2017-09-30 DIAGNOSIS — E119 Type 2 diabetes mellitus without complications: Secondary | ICD-10-CM | POA: Diagnosis not present

## 2017-09-30 DIAGNOSIS — M48062 Spinal stenosis, lumbar region with neurogenic claudication: Secondary | ICD-10-CM | POA: Diagnosis not present

## 2017-09-30 DIAGNOSIS — F32A Depression, unspecified: Secondary | ICD-10-CM

## 2017-09-30 DIAGNOSIS — F329 Major depressive disorder, single episode, unspecified: Secondary | ICD-10-CM | POA: Diagnosis not present

## 2017-09-30 MED ORDER — DULOXETINE HCL 30 MG PO CPEP: 30.0000 mg | ORAL_CAPSULE | Freq: Every day | ORAL | 3 refills | Status: DC

## 2017-09-30 NOTE — Patient Instructions (Addendum)
I will try a new medication duloxetine/cymbalta for nerve pain and depression.  I hope it helps at least one of these problems.  As we discussed, there is no good, easy answer to back pain.   I will call with the A1C results.  Likely we will stop the metformin.   I hope life gets better/easier.

## 2017-09-30 NOTE — Assessment & Plan Note (Addendum)
Add duloxitine  Seems that lumbar region with radiculopathy is her major pain area at this point.  Ortho plans repeat lumbar MRI.

## 2017-10-01 LAB — HEMOGLOBIN A1C
Est. average glucose Bld gHb Est-mCnc: 120 mg/dL
Hgb A1c MFr Bld: 5.8 % — ABNORMAL HIGH (ref 4.8–5.6)

## 2017-10-01 NOTE — Assessment & Plan Note (Signed)
Great control.  Given low BS at home, will try off metformin.

## 2017-10-01 NOTE — Assessment & Plan Note (Signed)
>>  ASSESSMENT AND PLAN FOR ADJUSTMENT REACTION WITH ANXIETY AND DEPRESSION WRITTEN ON 10/01/2017  4:46 PM BY HENSEL, ELSIE LABOR, MD  cymbalta  may help ongoing depression which is most likely an adjustment reaction with depressed affect.

## 2017-10-01 NOTE — Progress Notes (Signed)
   Subjective:    Patient ID: [name], female    DOB: 07/18/1941, 77 y.o.   MRN: [id number]  HPI Major acute issue in left sided back pain.  Starts in mid thoracic area and extends down to left leg - lateral thgh into calf.  Known osteoarthritis and degenerative disc disease of spine. Seeing ortho who are working up and have placed on robaxin.  Describes pain as severe and wants to discuss options.  Just now starting PT.  Not bilateral, no bowel or bladder incontinence, no saddle anesthesia.  Other issues: 1. DM.  Due for A1c.  Had low blood sugar x2.  Only on low dose metformen.  Has lost considerable weight over time since she was initially diagnosed.  2. Depression.  Husband is not doing well with lymphoma.  He is grouchy and she is worried.      Review of Systems     Objective:   Physical Exam Distribution of back pain is as described above.  Walks with an antalgic gait limping on left.  Normal strength in both lower ext. Mood is OK.         Assessment & Plan:

## 2017-10-01 NOTE — Assessment & Plan Note (Signed)
cymbalta may help ongoing depression which is most likely an adjustment reaction with depressed affect.

## 2017-10-07 DIAGNOSIS — M5136 Other intervertebral disc degeneration, lumbar region: Secondary | ICD-10-CM | POA: Diagnosis not present

## 2017-10-07 DIAGNOSIS — M545 Low back pain: Secondary | ICD-10-CM | POA: Diagnosis not present

## 2017-10-07 DIAGNOSIS — M5416 Radiculopathy, lumbar region: Secondary | ICD-10-CM | POA: Diagnosis not present

## 2017-10-08 ENCOUNTER — Ambulatory Visit: Payer: PPO | Admitting: Family Medicine

## 2017-10-08 NOTE — Progress Notes (Deleted)
   Subjective   Patient ID: [name]    DOB: 01/28/1941, 76 y.o. female   MRN: [id number]  CC: "Rash"  HPI: [name] is a 77 y.o. female who presents for a same day appointment for the following:  RASH  Had rash for *** days. Location: *** Medications tried: *** Similar rash in past: *** New medications or antibiotics: *** Tick, Insect or new pet exposure: *** Recent travel: *** New detergent or soap: *** Immunocompromised: ***  Symptoms Itching: *** Pain over rash: *** Feeling ill all over: *** Fever: *** Mouth sores: *** Face or tongue swelling: *** Trouble breathing: *** Joint swelling or pain: ***  ROS: see HPI for pertinent.  [place]: NIDT2DM with peripheral neuroapthy, CAD, HTN, asthma, pulmonary nodule, IDA, IBS, GERD, depression, vertigo. Surgical history breast biopsy bilateral, TAH, appendectomy.  Family history hearing loss, MI, stroke.  Smoking status reviewed.  Medications reviewed.  Objective   There were no vitals taken for this visit. Vitals and nursing note reviewed.  General: well nourished, well developed, NAD with non-toxic appearance HEENT: normocephalic, atraumatic, moist mucous membranes Neck: supple, non-tender without lymphadenopathy Cardiovascular: regular rate and rhythm without murmurs, rubs, or gallops Lungs: clear to auscultation bilaterally with normal work of breathing Abdomen: soft, non-tender, non-distended, normoactive bowel sounds Skin: warm, dry, no rashes or lesions, cap refill < 2 seconds Extremities: warm and well perfused, normal tone, no edema  Assessment & Plan   No problem-specific Assessment & Plan notes found for this encounter.  No orders of the defined types were placed in this encounter.  No orders of the defined types were placed in this encounter.   Harriet Butte, [place], PGY-2 10/08/2017, 9:28 PM

## 2017-10-09 ENCOUNTER — Ambulatory Visit: Payer: PPO | Admitting: Family Medicine

## 2017-10-14 DIAGNOSIS — M545 Low back pain: Secondary | ICD-10-CM | POA: Diagnosis not present

## 2017-10-14 DIAGNOSIS — M5136 Other intervertebral disc degeneration, lumbar region: Secondary | ICD-10-CM | POA: Diagnosis not present

## 2017-10-14 DIAGNOSIS — M5416 Radiculopathy, lumbar region: Secondary | ICD-10-CM | POA: Diagnosis not present

## 2017-10-21 DIAGNOSIS — M545 Low back pain: Secondary | ICD-10-CM | POA: Diagnosis not present

## 2017-10-21 DIAGNOSIS — M5416 Radiculopathy, lumbar region: Secondary | ICD-10-CM | POA: Diagnosis not present

## 2017-10-21 DIAGNOSIS — M5136 Other intervertebral disc degeneration, lumbar region: Secondary | ICD-10-CM | POA: Diagnosis not present

## 2017-10-28 ENCOUNTER — Encounter: Payer: Self-pay | Admitting: Family Medicine

## 2017-10-28 ENCOUNTER — Ambulatory Visit (INDEPENDENT_AMBULATORY_CARE_PROVIDER_SITE_OTHER): Payer: PPO | Admitting: Family Medicine

## 2017-10-28 ENCOUNTER — Other Ambulatory Visit: Payer: Self-pay

## 2017-10-28 DIAGNOSIS — M48062 Spinal stenosis, lumbar region with neurogenic claudication: Secondary | ICD-10-CM

## 2017-10-28 DIAGNOSIS — L304 Erythema intertrigo: Secondary | ICD-10-CM

## 2017-10-28 DIAGNOSIS — B372 Candidiasis of skin and nail: Secondary | ICD-10-CM

## 2017-10-28 DIAGNOSIS — M5416 Radiculopathy, lumbar region: Secondary | ICD-10-CM | POA: Diagnosis not present

## 2017-10-28 DIAGNOSIS — M545 Low back pain: Secondary | ICD-10-CM | POA: Diagnosis not present

## 2017-10-28 DIAGNOSIS — M5136 Other intervertebral disc degeneration, lumbar region: Secondary | ICD-10-CM | POA: Diagnosis not present

## 2017-10-28 MED ORDER — CLOTRIMAZOLE 1 % EX OINT: 1.0000 "application " | TOPICAL_OINTMENT | Freq: Two times a day (BID) | CUTANEOUS | 1 refills | Status: DC

## 2017-10-28 NOTE — Patient Instructions (Signed)
I sent in a refillable cream for your rash.   Stop the gabapentin.  Let me know if your back is OK or if we need to do something else.   Please get your eye exam and have them send me a copy. I suggest off the benadryl and use over the counter pepcid or zantac instead.

## 2017-10-29 ENCOUNTER — Encounter: Payer: Self-pay | Admitting: Family Medicine

## 2017-10-29 ENCOUNTER — Telehealth: Payer: Self-pay

## 2017-10-29 NOTE — Assessment & Plan Note (Signed)
Stop gabapentin.  No apparent benefit and has sig side effects.  No additions for now.

## 2017-10-29 NOTE — Progress Notes (Signed)
   Subjective:    Patient ID: [name], female    DOB: 08/21/1940, 77 y.o.   MRN: [id number]  HPI Two major issues 1. Rash in groin, prutitic.  No vaginal DC.  No fever.  No new contact agents. 2. Back and leg pain - gabapentin added.  It is making her confused/sluggish and she does not perceive any benefit.  Back pain is chronic and tolerable at present.    Review of Systems     Objective:   Physical Exam Groin rash typical of tinea cruris and or intertrigo.       Assessment & Plan:

## 2017-10-29 NOTE — Telephone Encounter (Signed)
Called and informed, yes, remain on cymbalta

## 2017-10-29 NOTE — Telephone Encounter (Signed)
Pt saw MD yesterday, called nurse line today- questioning if she is still supposed to take cymbalta? Pt's call back [phone] Wallace Cullens, RN

## 2017-10-29 NOTE — Assessment & Plan Note (Signed)
Treat topically, with drug which will kill both yeast and tinea

## 2017-11-04 ENCOUNTER — Ambulatory Visit: Payer: PPO | Admitting: Family Medicine

## 2017-11-04 DIAGNOSIS — M5136 Other intervertebral disc degeneration, lumbar region: Secondary | ICD-10-CM | POA: Diagnosis not present

## 2017-11-04 DIAGNOSIS — M5416 Radiculopathy, lumbar region: Secondary | ICD-10-CM | POA: Diagnosis not present

## 2017-11-04 DIAGNOSIS — M545 Low back pain: Secondary | ICD-10-CM | POA: Diagnosis not present

## 2017-11-09 DIAGNOSIS — M5136 Other intervertebral disc degeneration, lumbar region: Secondary | ICD-10-CM | POA: Diagnosis not present

## 2017-11-11 ENCOUNTER — Telehealth: Payer: Self-pay | Admitting: Family Medicine

## 2017-11-11 MED ORDER — GABAPENTIN 100 MG PO CAPS: 100.0000 mg | ORAL_CAPSULE | Freq: Three times a day (TID) | ORAL | 3 refills | Status: DC

## 2017-11-11 NOTE — Telephone Encounter (Signed)
Pt called and wanted to let Dr Andria Frames know that since she quit taking her Gabapentin she is extreme pain. She wants someone to call and discuss other med options. The only thing that is giving her any relief is taking Tylenol every two hours.

## 2017-11-11 NOTE — Telephone Encounter (Signed)
Called and discussed.  Talked about limits to ibuprofen and tylenol.  She had mental clouding on 300 mg gabapentin.  Obviously, it was relieving some pain - since much worse now that she is off.  Will try 100 mg tabs.

## 2017-11-12 NOTE — Telephone Encounter (Signed)
Pt called nurse line requesting to speak with Dr. Andria Frames again. She states she spoke with her husband about restarting gabapentin and lower dose, and he does not want her taking it since she had side effects- even though she will be at a lower dose. She would like to discuss with Dr. Andria Frames and possibly try something else. Her call back [phone] Wallace Cullens, RN

## 2017-11-12 NOTE — Addendum Note (Signed)
Addended by: Zenia Resides on: 11/12/2017 02:45 PM   Modules accepted: Orders

## 2017-11-12 NOTE — Telephone Encounter (Signed)
Concerned about side effects even at lower dose.  Nicely, awoke with less pain today.  No changes for now.  OK to not fill gabaentin.  I removed form med list and put back on allergy list.

## 2017-11-18 ENCOUNTER — Other Ambulatory Visit: Payer: Self-pay | Admitting: Family Medicine

## 2017-11-30 ENCOUNTER — Telehealth: Payer: Self-pay

## 2017-11-30 NOTE — Telephone Encounter (Signed)
Patient left message that her husband, Jamelia Varano, 01/17/35, also your patient, is currently admitted since Friday for pneumonia, MRSA and another infection. She has been with the patient continually and wants to know from you if there are any precautions she should take or medications she should take prophylactically.  Call back is [phone].  Danley Danker, RN [facility] RN)

## 2017-11-30 NOTE — Telephone Encounter (Signed)
Called and answered questios

## 2017-12-23 ENCOUNTER — Ambulatory Visit
Admission: RE | Admit: 2017-12-23 | Discharge: 2017-12-23 | Disposition: A | Discharge status: Home / Self Care | Payer: PPO | Source: Ambulatory Visit | Attending: Family Medicine | Admitting: Family Medicine

## 2017-12-23 ENCOUNTER — Ambulatory Visit (INDEPENDENT_AMBULATORY_CARE_PROVIDER_SITE_OTHER): Payer: PPO | Admitting: Family Medicine

## 2017-12-23 ENCOUNTER — Encounter: Payer: Self-pay | Admitting: Family Medicine

## 2017-12-23 ENCOUNTER — Other Ambulatory Visit: Payer: Self-pay

## 2017-12-23 DIAGNOSIS — E119 Type 2 diabetes mellitus without complications: Secondary | ICD-10-CM | POA: Diagnosis not present

## 2017-12-23 DIAGNOSIS — M79644 Pain in right finger(s): Secondary | ICD-10-CM | POA: Diagnosis not present

## 2017-12-23 DIAGNOSIS — G43909 Migraine, unspecified, not intractable, without status migrainosus: Secondary | ICD-10-CM

## 2017-12-23 DIAGNOSIS — R296 Repeated falls: Secondary | ICD-10-CM | POA: Diagnosis not present

## 2017-12-23 DIAGNOSIS — F4323 Adjustment disorder with mixed anxiety and depressed mood: Secondary | ICD-10-CM

## 2017-12-23 DIAGNOSIS — M48062 Spinal stenosis, lumbar region with neurogenic claudication: Secondary | ICD-10-CM

## 2017-12-23 DIAGNOSIS — S60011A Contusion of right thumb without damage to nail, initial encounter: Secondary | ICD-10-CM | POA: Diagnosis not present

## 2017-12-23 LAB — POCT GLYCOSYLATED HEMOGLOBIN (HGB A1C): Hemoglobin A1C: 5.9

## 2017-12-23 NOTE — Patient Instructions (Signed)
I will call with x ray results and A1C.

## 2017-12-24 ENCOUNTER — Encounter: Payer: Self-pay | Admitting: Family Medicine

## 2017-12-24 DIAGNOSIS — R296 Repeated falls: Secondary | ICD-10-CM | POA: Insufficient documentation

## 2017-12-24 MED ORDER — DULOXETINE HCL 30 MG PO CPEP: 30.0000 mg | ORAL_CAPSULE | Freq: Every day | ORAL | 3 refills | Status: DC

## 2017-12-24 MED ORDER — METHOCARBAMOL 500 MG PO TABS: 500.0000 mg | ORAL_TABLET | Freq: Three times a day (TID) | ORAL | 3 refills | Status: DC

## 2017-12-24 MED ORDER — TRAMADOL HCL 50 MG PO TABS: 50.0000 mg | ORAL_TABLET | Freq: Three times a day (TID) | ORAL | 3 refills | Status: DC | PRN

## 2017-12-24 NOTE — Assessment & Plan Note (Addendum)
I want her to see PT but she honestly does not have time now due to husband's weakness.  She will slow down and be more careful.  Hopefully husband will improve over next month and she will have time for PT eval.    Also on several meds that may increase fall risk.  Discuss next visit.

## 2017-12-24 NOTE — Assessment & Plan Note (Signed)
Negative x rays.  May have ligamentous injury and may lose nail.  Conservative treatment for now.

## 2017-12-24 NOTE — Assessment & Plan Note (Signed)
Continues to have high external stress.

## 2017-12-24 NOTE — Progress Notes (Signed)
   Subjective:    Patient ID: [name], female    DOB: [birth year], 77 y.o.   MRN: [id number]  HPI Several issues: Significant stress.  Husband has been declining badly over the last six months.  She is coping.    Needs refill on duloxetine, tramadol and methocarbamol.    Frequent falls:  Has fallen all her life due to "clumsiness"  Four falls in the last three months.  Denies weakness, lightheadedness, syncope.  She is distracted and rushing due to stress.  She attributes falls to this distraction.  Right thumb pain.  Last fall 2 days injured  Right thumb.  (she is unclear of mech of action.  Tripped and ended flat on driveway.)  Pain and significant bruising.  I am first medical to see post fall.  Multiple other sore and bruised areas - but none significant pain or functional impariment.  DM asymptomatic.  Due for A1c    Review of Systems     Objective:   Physical Exam  Gait normal Right thumb impressive echymosis.  Bleeding under nail suggest she will lose it.  Sig swelling.  Good ROM at MCP joint.  No movement at IP joint.       Assessment & Plan:

## 2017-12-24 NOTE — Assessment & Plan Note (Signed)
Has had weight loss with stress plus intentional.  Now nicely diet controled with A1C clearly at goal.

## 2017-12-28 ENCOUNTER — Telehealth: Payer: Self-pay | Admitting: Family Medicine

## 2017-12-28 NOTE — Telephone Encounter (Signed)
Pt just wanted to let Hensel know that she was diagniosed with Vertigo a while back and she forgot to mention that to Hensel at her last appt and this may be the reason sh keeps falling.

## 2017-12-28 NOTE — Telephone Encounter (Signed)
Noted.  She did not endorse any room spinning during the visit.  I still want her to see PT.

## 2018-01-28 ENCOUNTER — Other Ambulatory Visit: Payer: Self-pay | Admitting: Family Medicine

## 2018-02-03 ENCOUNTER — Other Ambulatory Visit: Payer: Self-pay | Admitting: Family Medicine

## 2018-02-03 DIAGNOSIS — R112 Nausea with vomiting, unspecified: Secondary | ICD-10-CM

## 2018-02-22 ENCOUNTER — Ambulatory Visit (INDEPENDENT_AMBULATORY_CARE_PROVIDER_SITE_OTHER): Payer: PPO

## 2018-02-22 ENCOUNTER — Other Ambulatory Visit: Payer: Self-pay

## 2018-02-22 ENCOUNTER — Encounter (HOSPITAL_COMMUNITY): Payer: Self-pay | Admitting: Emergency Medicine

## 2018-02-22 ENCOUNTER — Ambulatory Visit (HOSPITAL_COMMUNITY)
Admission: EM | Admit: 2018-02-22 | Discharge: 2018-02-22 | Disposition: A | Discharge status: Home / Self Care | Payer: PPO | Attending: Family Medicine | Admitting: Family Medicine

## 2018-02-22 DIAGNOSIS — S20219A Contusion of unspecified front wall of thorax, initial encounter: Secondary | ICD-10-CM

## 2018-02-22 DIAGNOSIS — M79671 Pain in right foot: Secondary | ICD-10-CM

## 2018-02-22 DIAGNOSIS — S99821A Other specified injuries of right foot, initial encounter: Secondary | ICD-10-CM | POA: Diagnosis not present

## 2018-02-22 DIAGNOSIS — W01198A Fall on same level from slipping, tripping and stumbling with subsequent striking against other object, initial encounter: Secondary | ICD-10-CM | POA: Diagnosis not present

## 2018-02-22 DIAGNOSIS — S92514A Nondisplaced fracture of proximal phalanx of right lesser toe(s), initial encounter for closed fracture: Secondary | ICD-10-CM | POA: Diagnosis not present

## 2018-02-22 DIAGNOSIS — W19XXXA Unspecified fall, initial encounter: Secondary | ICD-10-CM

## 2018-02-22 DIAGNOSIS — T07XXXA Unspecified multiple injuries, initial encounter: Secondary | ICD-10-CM

## 2018-02-22 DIAGNOSIS — S92511A Displaced fracture of proximal phalanx of right lesser toe(s), initial encounter for closed fracture: Secondary | ICD-10-CM | POA: Diagnosis not present

## 2018-02-22 NOTE — Discharge Instructions (Signed)
Use ice to painful areas Elevate foot and limit weightbearing Follow-up with your PCP in 2 weeks Take tramadol for pain Return for any worsening pain, problems, or new injury noted

## 2018-02-22 NOTE — ED Provider Notes (Signed)
[name]    CSN: [id number] Arrival date & time: 02/22/18  1530     History   Chief Complaint Chief Complaint  Patient presents with  . Fall  . Foot Injury    right    HPI [name] is a 77 y.o. female.   HPI  Patient states that she fell in her home.  She feels like he she had a foot against a stair step that helps her get into her bed.  She was going from her bedroom to bathroom.  She is certain she did not faint or have any dizziness.  She had her chin on the edge of the nightstand.  She has bruises on her anterior chest that she does not know how they got there.  She has also bruises on both legs.  She is mostly complaining of pain in her right foot and toe.  Pain with weightbearing.  This just happened this morning. She states that she does not think she has trouble with balance or strength.  She does have a diagnosis of frequent falls.  I told her to discuss with her PCP a physical therapy referral. She has trouble with her thoracic spine.  She has tramadol for pain.  She states she will take tramadol for her toe pain and does not need a prescription.  She has multiple drug allergies.  These are reviewed.    Past Medical History:  Diagnosis Date  . Asthma   . Hypertension   . Migraine     Patient Active Problem List   Diagnosis Date Noted  . Frequent falls 12/24/2017  . Pain of right thumb 12/23/2017  . Back pain 07/24/2017  . Rotator cuff syndrome, left 06/19/2017  . Depression 03/26/2017  . Ruptured silicone breast implant 12/08/2016  . Iron deficiency anemia due to chronic blood loss 10/31/2015  . Chronic right-sided thoracic back pain 07/26/2015  . Spinal stenosis of lumbar region 07/10/2015  . Abdominal aortic atherosclerosis ([place]) 07/10/2015  . Sleep apnea 05/22/2010  . PERIPHERAL NEUROPATHY 02/06/2010  . PULMONARY NODULE, SOLITARY 07/20/2009  . Irritable bowel syndrome 11/12/2007  . Non-insulin dependent type 2 diabetes mellitus ([place])  10/08/2006  . HYPERCHOLESTEROLEMIA 10/08/2006  . HYPERTRIGLYCERIDEMIA 10/08/2006  . Reaction, adjustment, with anxious, depressed mood 10/08/2006  . Migraine headache 10/08/2006  . CARPAL TUNNEL SYNDROME 10/08/2006  . HYPERTENSION, BENIGN SYSTEMIC 10/08/2006  . RHINITIS, ALLERGIC 10/08/2006  . ASTHMA, PERSISTENT, MODERATE 10/08/2006  . REFLUX ESOPHAGITIS 10/08/2006  . ECZEMA, ATOPIC DERMATITIS 10/08/2006  . PSORIASIS 10/08/2006  . Osteoarthritis of spine 10/08/2006  . VERTIGO NOS OR DIZZINESS 10/08/2006    Past Surgical History:  Procedure Laterality Date  . ABDOMINAL HYSTERECTOMY    . APPENDECTOMY    . Arm surgery    . BREAST BIOPSY Right 10/10/2009  . BREAST CYST ASPIRATION  12/20/2013  . BREAST EXCISIONAL BIOPSY Left 1998  . BREAST EXCISIONAL BIOPSY Right 1993    OB History   None      Home Medications    Prior to Admission medications   Medication Sig Start Date End Date Taking? Authorizing Provider  albuterol (PROVENTIL HFA;VENTOLIN HFA) 108 (90 Base) MCG/ACT inhaler Inhale 2 puffs into the lungs every 6 (six) hours as needed for wheezing or shortness of breath. 05/04/17   Mercy Riding, MD  aspirin 81 MG chewable tablet Chew 81 mg by mouth daily.      [provider]  Blood Glucose Monitoring Suppl (ONETOUCH VERIO) w/Device KIT 1  kit by Does not apply route daily. 11/12/16   Zenia Resides, MD  DULoxetine (CYMBALTA) 30 MG capsule Take 1 capsule (30 mg total) by mouth daily. 12/24/17   Zenia Resides, MD  enalapril (VASOTEC) 10 MG tablet TAKE ONE TABLET BY MOUTH ONCE DAILY 11/18/17   Zenia Resides, MD  famotidine (PEPCID) 40 MG tablet Take 40 mg by mouth every evening.     [provider]  ferrous sulfate (FERROUSUL) 325 (65 FE) MG tablet Take 1 tablet (325 mg total) by mouth 2 (two) times daily with a meal. 11/01/15   Hensel, Jamal Collin, MD  ipratropium (ATROVENT) 0.06 % nasal spray Place 2 sprays into both nostrils 4 (four) times daily. 11/18/16    Smiley Houseman, MD  Lancets [facility] ULTRASOFT) lancets Test blood sugar daily.  Dx Non insulin dependant Type 2 DM E11.9 11/14/16   Zenia Resides, MD  loratadine (CLARITIN) 10 MG tablet Take 10 mg by mouth daily.    [provider]  methocarbamol (ROBAXIN) 500 MG tablet Take 1 tablet (500 mg total) by mouth 3 (three) times daily. 12/24/17   Zenia Resides, MD  Multiple Vitamin (MULTIVITAMIN WITH MINERALS) TABS tablet Take 1 tablet by mouth daily.    [provider]  nortriptyline (PAMELOR) 50 MG capsule TAKE ONE CAPSULE BY MOUTH TWICE DAILY 09/28/17   Hensel, Jamal Collin, MD  Omega-3 Fatty Acids (COROMEGA OMEGA 3 SQUEEZE) EMUL Take 1 application by mouth daily.    [provider]  ondansetron (ZOFRAN) 4 MG tablet TAKE 1 TABLET BY MOUTH EVERY 8 HOURS AS NEEDED FOR  NAUSEA  OR  VOMITING 02/04/18   Hensel, Jamal Collin, MD  [facility] VERIO test strip USE  STRIP TO CHECK GLUCOSE ONCE DAILY 01/28/18   Zenia Resides, MD  simvastatin (ZOCOR) 20 MG tablet TAKE ONE-HALF TABLET BY MOUTH ONCE DAILY 12/15/16   Zenia Resides, MD  topiramate (TOPAMAX) 50 MG tablet TAKE THREE TABLETS BY MOUTH AT BEDTIME 02/02/17   Zenia Resides, MD  traMADol (ULTRAM) 50 MG tablet Take 1 tablet (50 mg total) by mouth every 8 (eight) hours as needed. 12/24/17   Zenia Resides, MD  vitamin C (ASCORBIC ACID) 250 MG tablet Take 500 mg by mouth daily.     [provider]    Family History Family History  Problem Relation Age of Onset  . Hearing loss Sister   . Heart attack Sister 32       s/p 4 bypasses   . Stroke Brother        71  . Breast cancer Maternal Aunt     Social History Social History   Tobacco Use  . Smoking status: Former Smoker    Packs/day: 2.00    Years: 33.00    Pack years: 66.00    Types: Cigarettes    Start date: 08/11/1957    Last attempt to quit: 04/04/1991    Years since quitting: 26.9  . Smokeless tobacco: Never Used  Substance Use Topics  .  Alcohol use: No  . Drug use: No     Allergies   Amoxicillin; Azithromycin; Butalbital; Clindamycin hcl; Hydrocodone-acetaminophen; Januvia [sitagliptin]; Levaquin [levofloxacin in d5w]; Meperidine hcl; Sulfamethoxazole; Ketorolac; Lidocaine; Macrodantin [nitrofurantoin macrocrystal]; Methocarbamol; Codeine phosphate; Etodolac; Gabapentin; Naproxen; Oxycodone; Pentazocine-naloxone; Sumatriptan; and Zonegran   Review of Systems Review of Systems  Constitutional: Negative for chills and fever.  HENT: Negative for ear pain and sore throat.   Eyes: Negative for  pain and visual disturbance.  Respiratory: Negative for cough and shortness of breath.   Cardiovascular: Negative for chest pain and palpitations.  Gastrointestinal: Negative for abdominal pain and vomiting.  Genitourinary: Negative for dysuria and hematuria.  Musculoskeletal: Positive for arthralgias and gait problem. Negative for back pain.       Right foot pain  Skin: Positive for color change. Negative for rash.       Bruising  Neurological: Negative for seizures and syncope.  Psychiatric/Behavioral: Negative for dysphoric mood. The patient is not nervous/anxious.   All other systems reviewed and are negative.    Physical Exam Triage Vital Signs ED Triage Vitals [02/22/18 1602]  Enc Vitals Group     BP 103/67     Pulse Rate 92     Resp      Temp 98.3 F (36.8 C)     Temp Source Oral     SpO2 97 %     Weight      Height      Head Circumference      Peak Flow      Pain Score 10     Pain Loc      Pain Edu?      Excl. in [place]?    No data found.  Updated Vital Signs BP 103/67 (BP Location: Left Wrist)   Pulse 92   Temp 98.3 F (36.8 C) (Oral)   SpO2 97%       Physical Exam  Constitutional: She is oriented to person, place, and time. She appears well-developed and well-nourished. No distress.  HENT:  Head: Normocephalic and atraumatic.  Right Ear: External ear normal.  Left Ear: External ear normal.    Mouth/Throat: Oropharynx is clear and moist.  Pharynx examined.  Teeth intact.  Eyes: Pupils are equal, round, and reactive to light. Conjunctivae are normal.  Neck: Normal range of motion. Neck supple.  Submental bruise 2 cm across.  No tenderness of mandible  Cardiovascular: Normal rate, regular rhythm and normal heart sounds.  Pulmonary/Chest: Effort normal and breath sounds normal. No respiratory distress.  Abdominal: Soft. She exhibits no distension.  Musculoskeletal: Normal range of motion. She exhibits no edema.  Second toe on right foot has some ecchymosis and swelling.  No deformity.  Lymphadenopathy:    She has no cervical adenopathy.  Neurological: She is alert and oriented to person, place, and time.  Skin: Skin is warm and dry.  2 large ecchymoses, palpable induration on right anterior chest above breast each 5 to 6 cm across.  Ecchymosis on left lower leg lateral 3 cm across.  Ecchymosis on right anterior knee 2 cm across, just lateral to tibial tuberosity.  No joint involvement.  Psychiatric: She has a normal mood and affect. Her behavior is normal.     UC Treatments / Results  Labs (all labs ordered are listed, but only abnormal results are displayed) Labs Reviewed - No data to display  EKG None  Radiology Dg Foot Complete Right  Result Date: 02/22/2018 CLINICAL DATA:  [place] [address] striking nightstand today, RIGHT foot pain especially at base of great toe, swelling and bruising at second toe, unable to bear weight EXAM: RIGHT FOOT COMPLETE - 3+ VIEW COMPARISON:  None FINDINGS: Mild osseous demineralization. Degenerative changes at first MTP joint. Remaining joint spaces preserved. Nondisplaced intra-articular fracture at lateral aspect, head of proximal phalanx second toe. Corticated ossicle identified at lateral aspect base of proximal phalanx great toe, appears old. No additional fracture, dislocation, or bone  destruction. IMPRESSION: Nondisplaced intra-articular fracture at  head of proximal phalanx RIGHT second toe. Electronically Signed   By: Lavonia Dana M.D.   On: 02/22/2018 16:41    Procedures Procedures (including critical care time)  Medications Ordered in UC Medications - No data to display  Initial Impression / Assessment and Plan / UC Course  I have reviewed the triage vital signs and the nursing notes.  Pertinent labs & imaging results that were available during my care of the patient were reviewed by me and considered in my medical decision making (see chart for details).    Discussed with frequent falls she should see her PCP.  She may follow-up with her PCP for her toe fracture.  This is predicted to do well.  Discussed limited ambulation, wearing postop shoe, buddy taping.  Pain controlled with tramadol ice and elevation.  Final Clinical Impressions(s) / UC Diagnoses   Final diagnoses:  Fall, initial encounter  Closed nondisplaced fracture of proximal phalanx of lesser toe of right foot, initial encounter  Multiple contusions     Discharge Instructions     Use ice to painful areas Elevate foot and limit weightbearing Follow-up with your PCP in 2 weeks Take tramadol for pain Return for any worsening pain, problems, or new injury noted   ED Prescriptions    None     Controlled Substance Prescriptions [place] Controlled Substance Registry consulted? Not Applicable   Raylene Everts, MD 02/22/18 2109

## 2018-03-01 DIAGNOSIS — L570 Actinic keratosis: Secondary | ICD-10-CM | POA: Diagnosis not present

## 2018-03-01 DIAGNOSIS — L821 Other seborrheic keratosis: Secondary | ICD-10-CM | POA: Diagnosis not present

## 2018-03-01 DIAGNOSIS — L298 Other pruritus: Secondary | ICD-10-CM | POA: Diagnosis not present

## 2018-03-09 ENCOUNTER — Other Ambulatory Visit: Payer: Self-pay | Admitting: Family Medicine

## 2018-04-14 ENCOUNTER — Other Ambulatory Visit: Payer: Self-pay | Admitting: Family Medicine

## 2018-04-28 ENCOUNTER — Ambulatory Visit (INDEPENDENT_AMBULATORY_CARE_PROVIDER_SITE_OTHER): Payer: PPO | Admitting: Family Medicine

## 2018-04-28 VITALS — BP 125/80 | HR 92 | Temp 98.6°F | Wt 187.6 lb

## 2018-04-28 DIAGNOSIS — Z23 Encounter for immunization: Secondary | ICD-10-CM | POA: Diagnosis not present

## 2018-04-28 DIAGNOSIS — R399 Unspecified symptoms and signs involving the genitourinary system: Secondary | ICD-10-CM | POA: Diagnosis not present

## 2018-04-28 LAB — POCT URINALYSIS DIP (MANUAL ENTRY)
Bilirubin, UA: NEGATIVE
Blood, UA: NEGATIVE
Glucose, UA: NEGATIVE mg/dL
Ketones, POC UA: NEGATIVE mg/dL
Leukocytes, UA: NEGATIVE
Nitrite, UA: NEGATIVE
Protein Ur, POC: NEGATIVE mg/dL
Spec Grav, UA: 1.015 (ref 1.010–1.025)
Urobilinogen, UA: 0.2 E.U./dL
pH, UA: 7 (ref 5.0–8.0)

## 2018-04-28 MED ORDER — FLUCONAZOLE 150 MG PO TABS: 150.0000 mg | ORAL_TABLET | Freq: Every day | ORAL | 0 refills | Status: DC

## 2018-04-28 NOTE — Progress Notes (Signed)
   Subjective:   Patient ID: [name]    DOB: 12-Apr-1941, 77 y.o. female   MRN: [id number]  CC: concern for UTI, yeast infection  HPI: [name] is a 77 y.o. female who presents to clinic today for the following issues.  Concern for UTI  Symptoms began several months ago.  Has not been seen by a provider for this.  History of frequent UTIs. Wears incontinence pads, she changes these every time she goes to the bathroom.   Reports burning with urination.  No frequency or urgency.  She is concerned this may be becoming a kidney infection as she now has right-sided LBP. No fevers, chills, nausea, vomiting. Has noticed a bad odor for some weeks. She states she drinks plenty of water.  No blood noted in urine.  No abdominal or pelvic pain.  No SOB, CP, leg swelling.   Yeast infection  Patient states she sometimes gets yeast infections after antibiotics.  She feels she may have this at this time given vaginal irritation.  She declines a wet prep today.  Vaginal discharge is white and thick.  No fevers, chills, vaginal bleeding.  Health Maintenance:  -flu shot administered today   ROS: See HPI for pertinent ROS.  Social: Patient is a former smoker, quit in 1992 Medications reviewed. Objective:   BP 125/80   Pulse 92   Temp 98.6 F (37 C)   Wt 187 lb 9.6 oz (85.1 kg)   SpO2 95%   BMI 36.64 kg/m  Vitals and nursing note reviewed.  General: 77 year old female, NAD CV: RRR no MRG  Lungs: CTAB, normal effort  Abdomen: soft, NTND, no masses or organomegaly, no CVA tenderness bilaterally, +bs Pelvic exam: deferred by patient Skin: warm, dry, no rash Extremities: warm and well perfused, normal tone  Assessment & Plan:   Concern for UTI  Reports dysuria, pt with h/o recurrent UTI's.  Normal UA negative for leukocytes and nitrite.   No suprapubic tenderness on exam.  Low suspicion for pyelonephritis given no fevers or CVA tenderness.  Recommend fluids to avoid dehydration. Possible  that her dysuria and odor may be 2/2 yeast infection, will treat as below.  Return precautions discussed.  Vaginal itching Vaginal itching and irritation with minimal white clumpy discharge. Reports an odor that has been present x several weeks.   Patient declines wet prep today but would like to try treatment for this to see if it helps with her dysuria.   -Rx: diflucan, #2 tabs, no refill -return if symptoms worsen or do not improve  Health maintenance -Flu shot given today  Orders Placed This Encounter  Procedures  . Flu Vaccine QUAD 36+ mos IM  . POCT urinalysis dipstick   Follow-up: If symptoms worsen or do not improve.  Lovenia Kim, MD [address]  PGY-3 05/05/2018 5:55 PM

## 2018-04-28 NOTE — Patient Instructions (Signed)
It was nice meeting you today.  You were seen in clinic for concern for urinary tract infection or kidney infection.  Your exam looked good and your urine does not have any evidence of urinary tract infection at this time.  I would recommend continuing to drink plenty of water and stay well-hydrated.  It is possible your urine may have an odor due to dehydration.   We also discussed that your vaginal irritation and itching may be from a yeast infection.  I have prescribed you Diflucan tablets, you may take 1 today and 1 in 2 days if not feeling better.    Additionally, you received your flu shot today.  Please call clinic if you have any questions.  Lovenia Kim MD

## 2018-05-25 DIAGNOSIS — L570 Actinic keratosis: Secondary | ICD-10-CM | POA: Diagnosis not present

## 2018-05-25 DIAGNOSIS — L821 Other seborrheic keratosis: Secondary | ICD-10-CM | POA: Diagnosis not present

## 2018-05-25 DIAGNOSIS — L218 Other seborrheic dermatitis: Secondary | ICD-10-CM | POA: Diagnosis not present

## 2018-05-25 DIAGNOSIS — L304 Erythema intertrigo: Secondary | ICD-10-CM | POA: Diagnosis not present

## 2018-06-07 ENCOUNTER — Other Ambulatory Visit: Payer: Self-pay

## 2018-06-07 ENCOUNTER — Encounter: Payer: Self-pay | Admitting: Family Medicine

## 2018-06-07 ENCOUNTER — Ambulatory Visit (INDEPENDENT_AMBULATORY_CARE_PROVIDER_SITE_OTHER): Payer: PPO | Admitting: Family Medicine

## 2018-06-07 VITALS — BP 118/72 | HR 93 | Temp 98.0°F | Ht 60.0 in | Wt 189.0 lb

## 2018-06-07 DIAGNOSIS — L409 Psoriasis, unspecified: Secondary | ICD-10-CM

## 2018-06-07 DIAGNOSIS — E119 Type 2 diabetes mellitus without complications: Secondary | ICD-10-CM | POA: Diagnosis not present

## 2018-06-07 DIAGNOSIS — F4323 Adjustment disorder with mixed anxiety and depressed mood: Secondary | ICD-10-CM

## 2018-06-07 DIAGNOSIS — L309 Dermatitis, unspecified: Secondary | ICD-10-CM | POA: Diagnosis not present

## 2018-06-07 DIAGNOSIS — R112 Nausea with vomiting, unspecified: Secondary | ICD-10-CM | POA: Diagnosis not present

## 2018-06-07 DIAGNOSIS — N761 Subacute and chronic vaginitis: Secondary | ICD-10-CM | POA: Diagnosis not present

## 2018-06-07 DIAGNOSIS — I10 Essential (primary) hypertension: Secondary | ICD-10-CM | POA: Diagnosis not present

## 2018-06-07 DIAGNOSIS — N760 Acute vaginitis: Secondary | ICD-10-CM | POA: Insufficient documentation

## 2018-06-07 DIAGNOSIS — G43909 Migraine, unspecified, not intractable, without status migrainosus: Secondary | ICD-10-CM

## 2018-06-07 LAB — POCT GLYCOSYLATED HEMOGLOBIN (HGB A1C): HbA1c, POC (controlled diabetic range): 8.6 % — AB (ref 0.0–7.0)

## 2018-06-07 MED ORDER — ONDANSETRON HCL 4 MG PO TABS: ORAL_TABLET | ORAL | 3 refills | Status: DC

## 2018-06-07 MED ORDER — FLUCONAZOLE 150 MG PO TABS: 150.0000 mg | ORAL_TABLET | Freq: Once | ORAL | 0 refills | Status: AC

## 2018-06-07 MED ORDER — METRONIDAZOLE 500 MG PO TABS: 500.0000 mg | ORAL_TABLET | Freq: Two times a day (BID) | ORAL | 0 refills | Status: AC

## 2018-06-07 MED ORDER — METFORMIN HCL 500 MG PO TABS: 500.0000 mg | ORAL_TABLET | Freq: Two times a day (BID) | ORAL | 3 refills | Status: DC

## 2018-06-07 MED ORDER — TRAMADOL HCL 50 MG PO TABS: 50.0000 mg | ORAL_TABLET | Freq: Three times a day (TID) | ORAL | 3 refills | Status: DC | PRN

## 2018-06-07 NOTE — Patient Instructions (Addendum)
Because metformin has caused diarrhea before, lets start slow.  One pill every morning for two weeks then one pill twice a day thereafter.   Weight is the big problem.   Get your eye exam and please have them send me a copy of the results.   OK to keep using the two things the dermatologist gave. Also, after you shower - maybe even twice a day, use eucerin lotion to moisturize. Look over the counter for tar shampoo Your A1C is much higher.  You need to walk briskly for 45 minutes, 5 days per week.[name]

## 2018-06-08 ENCOUNTER — Encounter: Payer: Self-pay | Admitting: Family Medicine

## 2018-06-08 NOTE — Assessment & Plan Note (Signed)
Well controled on current meds 

## 2018-06-08 NOTE — Progress Notes (Signed)
   Subjective:    Patient ID: [name], female    DOB: [birth year], 77 y.o.   MRN: [id number]  HPI Multiple issues with DM being most important. 1. DM  Was diet controled.  Weaned off metformin.  Stress with husband now retired and home all the time and ill with lymphoma.  She has gained 20 lbs since her low weight.  Not exercising.   2. Significant scalp itching and elbow itching and total body itching.  Told by dermatologist likely psoriasis and started on topical steroids.   3. Adjustment reaction.  Not depressed per se.  Describes herself as stressed with no outlet.  Offered counseling.  Wants to try manage on own.  We settled on walking daily without husband.   4. C/O foul smelling vag discharge.  No intercourse.  Already treated empirically for candida.   5. Due of eye exam.   6. Needs refill on a couple of meds.    Review of Systems     Objective:   Physical Exam VS and wt noted. Lungs clear Cardiac RRR without m or g Skin.  Subtle lesions on scalp and at hairline suggestive of psoriasis.  Dry skin.  Minimal skin changes on elbows.   Affect normal in room.  Expresses frustrations freely.        Assessment & Plan:

## 2018-06-08 NOTE — Assessment & Plan Note (Signed)
Empiric flagyl followed by diflucan.

## 2018-06-08 NOTE — Assessment & Plan Note (Signed)
Minor - I think the real problem is neurodermatitis.

## 2018-06-08 NOTE — Assessment & Plan Note (Signed)
Worse with weight gain.  Start back on metformin and regular exercise.

## 2018-06-08 NOTE — Assessment & Plan Note (Signed)
A bit worse.  We settled on exercise and FU in one month.  Possible counseling.

## 2018-07-05 ENCOUNTER — Encounter: Payer: Self-pay | Admitting: Family Medicine

## 2018-07-05 ENCOUNTER — Ambulatory Visit (INDEPENDENT_AMBULATORY_CARE_PROVIDER_SITE_OTHER): Payer: PPO | Admitting: Family Medicine

## 2018-07-05 ENCOUNTER — Telehealth: Payer: Self-pay

## 2018-07-05 ENCOUNTER — Other Ambulatory Visit: Payer: Self-pay

## 2018-07-05 VITALS — BP 130/87 | HR 88 | Temp 98.0°F | Ht 60.0 in | Wt 188.6 lb

## 2018-07-05 DIAGNOSIS — F4323 Adjustment disorder with mixed anxiety and depressed mood: Secondary | ICD-10-CM | POA: Diagnosis not present

## 2018-07-05 DIAGNOSIS — L409 Psoriasis, unspecified: Secondary | ICD-10-CM | POA: Diagnosis not present

## 2018-07-05 DIAGNOSIS — K5900 Constipation, unspecified: Secondary | ICD-10-CM

## 2018-07-05 DIAGNOSIS — R32 Unspecified urinary incontinence: Secondary | ICD-10-CM | POA: Diagnosis not present

## 2018-07-05 DIAGNOSIS — N898 Other specified noninflammatory disorders of vagina: Secondary | ICD-10-CM | POA: Diagnosis not present

## 2018-07-05 DIAGNOSIS — N761 Subacute and chronic vaginitis: Secondary | ICD-10-CM | POA: Diagnosis not present

## 2018-07-05 LAB — POCT WET PREP (WET MOUNT)
Clue Cells Wet Prep Whiff POC: NEGATIVE
Trichomonas Wet Prep HPF POC: ABSENT

## 2018-07-05 MED ORDER — HYDROXYZINE HCL 10 MG PO TABS: 10.0000 mg | ORAL_TABLET | Freq: Three times a day (TID) | ORAL | 3 refills | Status: DC

## 2018-07-05 MED ORDER — ESTROGENS, CONJUGATED 0.625 MG/GM VA CREA: 1.0000 | TOPICAL_CREAM | Freq: Every day | VAGINAL | 12 refills | Status: DC

## 2018-07-05 MED ORDER — POLYETHYLENE GLYCOL 3350 17 GM/SCOOP PO POWD: 17.0000 g | Freq: Every day | ORAL | 1 refills | Status: DC

## 2018-07-05 NOTE — Telephone Encounter (Signed)
Per CoverMyMeds, Hydroxyzine non-preferred for allergy/itching. Preferred is levocetirizine.  Please send alternative or let RN clinic know if you would like to attempt PA for Hydroxyzine.  Danley Danker, RN [facility] RN)

## 2018-07-05 NOTE — Patient Instructions (Signed)
STop the loratidine and try the hydroxycine for the itching.  We may go back depending on how the new medicine makes you feel. Use the estrogen vaginal cream daily.  Give it at least two weeks to say whether or not it is helping.   I sent in a prescription for constipation medicine.  Compare with OTC cost.   If you are not improved in 2-4 weeks, I will refer you to a urologist.

## 2018-07-06 ENCOUNTER — Encounter: Payer: Self-pay | Admitting: Family Medicine

## 2018-07-06 NOTE — Telephone Encounter (Signed)
Patient calling to inquire if she can get her Hydroxyzine filled?  Please call patient to give her status of prescription, at  # [phone].

## 2018-07-06 NOTE — Telephone Encounter (Signed)
I notified patient that I just completed the form.

## 2018-07-06 NOTE — Assessment & Plan Note (Signed)
Confusing picture with elements of stress incontinence and concern for overflow incontinence.  Given confusing picture, I hesitate to begin Rx beyond topical estrogens.  If that fails to help, will refer to urology.

## 2018-07-06 NOTE — Assessment & Plan Note (Signed)
Will treat as atrophic vaginitis and hope this helps urinary sx.

## 2018-07-06 NOTE — Progress Notes (Signed)
Established Patient Office Visit  Subjective:  Patient ID: [name], female    DOB: 1941-03-20  Age: 77 y.o. MRN: [id number]  CC:  Chief Complaint  Patient presents with  . Urinary Incontinence    HPI [name] presents for urinary incontinence and repeated vaginal irration.   1. Urinary incontinence is a confusing story which doesn't fit easily into stress, urge or overflow incontinence.  States that she has times when it is difficult to void - especially early in the morning.  Then, in less than 30 minutes will have a large volume void.  Or worse, she will have large volume incontinence.  States she is constantly "wet down there" suggesting ongoing leakage.  Does endorse that incontinence is more likely when she valsalvas such as with cough or sneeze.  Does not endorse urgency prior to incontinence.  Will be walking and then soak through pads she wears without pre warning.    2. Vaginal irritation.  Monagamous.  No risk for STI.  At risk due to diabetes and urinary incontinence.  3. C/O psoriasis cream not working.  Actually, she does not have plaques, just itching at typical psoriasis sites.  Does admit that stress worsens itching.  4. Situational anxiety and depression.  Husband is in the last chapter of his life with myelofibrosis.  He has a limited life expectancy and requires frequent transfusions.  She reports that he is irritable with her.  I saw husband separately and he does not endorse any depression sx or emotional distress  Past Medical History:  Diagnosis Date  . Asthma   . Hypertension   . Migraine     Past Surgical History:  Procedure Laterality Date  . ABDOMINAL HYSTERECTOMY    . APPENDECTOMY    . Arm surgery    . BREAST BIOPSY Right 10/10/2009  . BREAST CYST ASPIRATION  12/20/2013  . BREAST EXCISIONAL BIOPSY Left 1998  . BREAST EXCISIONAL BIOPSY Right 1993    Family History  Problem Relation Age of Onset  . Hearing loss Sister   . Heart attack  Sister 67       s/p 4 bypasses   . Stroke Brother        44  . Breast cancer Maternal Aunt     Social History   Socioeconomic History  . Marital status: Married    Spouse name: Not on file  . Number of children: Not on file  . Years of education: Not on file  . Highest education level: Not on file  Occupational History  . Not on file  Social Needs  . Financial resource strain: Not on file  . Food insecurity:    Worry: Not on file    Inability: Not on file  . Transportation needs:    Medical: Not on file    Non-medical: Not on file  Tobacco Use  . Smoking status: Former Smoker    Packs/day: 2.00    Years: 33.00    Pack years: 66.00    Types: Cigarettes    Start date: 08/11/1957    Last attempt to quit: 04/04/1991    Years since quitting: 27.2  . Smokeless tobacco: Never Used  Substance and Sexual Activity  . Alcohol use: No  . Drug use: No  . Sexual activity: Not on file  Lifestyle  . Physical activity:    Days per week: Not on file    Minutes per session: Not on file  . Stress: Not on file  Relationships  . Social connections:    Talks on phone: Not on file    Gets together: Not on file    Attends religious service: Not on file    Active member of club or organization: Not on file    Attends meetings of clubs or organizations: Not on file    Relationship status: Not on file  . Intimate partner violence:    Fear of current or ex partner: Not on file    Emotionally abused: Not on file    Physically abused: Not on file    Forced sexual activity: Not on file  Other Topics Concern  . Not on file  Social History Narrative  . Not on file    Outpatient Medications Prior to Visit  Medication Sig Dispense Refill  . albuterol (PROVENTIL HFA;VENTOLIN HFA) 108 (90 Base) MCG/ACT inhaler Inhale 2 puffs into the lungs every 6 (six) hours as needed for wheezing or shortness of breath. 1 Inhaler 0  . aspirin 81 MG chewable tablet Chew 81 mg by mouth daily.      . Blood  Glucose Monitoring Suppl (ONETOUCH VERIO) w/Device KIT 1 kit by Does not apply route daily. 1 kit 0  . DULoxetine (CYMBALTA) 30 MG capsule Take 1 capsule (30 mg total) by mouth daily. 90 capsule 3  . enalapril (VASOTEC) 10 MG tablet TAKE ONE TABLET BY MOUTH ONCE DAILY 90 tablet 3  . famotidine (PEPCID) 40 MG tablet Take 40 mg by mouth every evening.     Marland Kitchen ipratropium (ATROVENT) 0.06 % nasal spray Place 2 sprays into both nostrils 4 (four) times daily. 15 mL 0  . Lancets (ONETOUCH ULTRASOFT) lancets Test blood sugar daily.  Dx Non insulin dependant Type 2 DM E11.9 100 each 12  . loratadine (CLARITIN) 10 MG tablet Take 10 mg by mouth daily.    . metFORMIN (GLUCOPHAGE) 500 MG tablet Take 1 tablet (500 mg total) by mouth 2 (two) times daily with a meal. 180 tablet 3  . Multiple Vitamin (MULTIVITAMIN WITH MINERALS) TABS tablet Take 1 tablet by mouth daily.    . nortriptyline (PAMELOR) 50 MG capsule TAKE ONE CAPSULE BY MOUTH TWICE DAILY 180 capsule 3  . Omega-3 Fatty Acids (COROMEGA OMEGA 3 SQUEEZE) EMUL Take 1 application by mouth daily.    . ondansetron (ZOFRAN) 4 MG tablet TAKE 1 TABLET BY MOUTH EVERY 8 HOURS AS NEEDED FOR  NAUSEA  OR  VOMITING 20 tablet 3  . ONETOUCH VERIO test strip USE  STRIP TO CHECK GLUCOSE ONCE DAILY 100 each 3  . simvastatin (ZOCOR) 20 MG tablet TAKE ONE-HALF TABLET BY MOUTH ONCE DAILY 45 tablet 3  . topiramate (TOPAMAX) 50 MG tablet TAKE 3 TABLETS BY MOUTH AT BEDTIME 270 tablet 3  . traMADol (ULTRAM) 50 MG tablet Take 1 tablet (50 mg total) by mouth every 8 (eight) hours as needed. 100 tablet 3  . vitamin C (ASCORBIC ACID) 250 MG tablet Take 500 mg by mouth daily.      No facility-administered medications prior to visit.     Allergies  Allergen Reactions  . Amoxicillin Rash  . Azithromycin Rash    Itching/rash  . Butalbital Other (See Comments)    Reaction: trouble breathing, but uncertain  . Clindamycin Hcl Rash  . Hydrocodone-Acetaminophen Rash    REACTION:  developed rash  . Januvia [Sitagliptin] Other (See Comments)    Worsened migraines.  Mack Hook [Levofloxacin In D5w] Diarrhea  . Meperidine Hcl Other (See Comments)  REACTION: itching and hallucinations  . Sulfamethoxazole Rash  . Ketorolac Swelling    Received ketorolac, methocarbamal, and lidocaine patch all at the same time.  Lip swelling 12 hours later  . Lidocaine Swelling    Received ketorolac, methocarbamal, and lidocaine patch all at the same time.  Lip swelling 12 hours later  . Macrodantin [Nitrofurantoin Macrocrystal] Itching  . Methocarbamol Swelling    Received ketorolac, methocarbamal, and lidocaine patch all at the same time.  Lip swelling 12 hours later  . Codeine Phosphate Other (See Comments)    REACTION: unknown  . Etodolac Other (See Comments)    REACTION: stomach upset  . Gabapentin Other (See Comments)    REACTION: mental clouding  Does not want to try again even in low dose.  . Naproxen Other (See Comments)    REACTION: stomach upset  . Oxycodone Other (See Comments)    Reaction: Stomach upset  . Pentazocine-Naloxone Other (See Comments)    REACTION: unspecified  . Sumatriptan Other (See Comments)    REACTION: chest pain  . Zonegran Other (See Comments)    Reaction: Unknown    ROS Review of Systems    Objective:    Physical Exam  BP 130/87   Pulse 88   Temp 98 F (36.7 C) (Oral)   Ht 5' (1.524 m)   Wt 188 lb 9.6 oz (85.5 kg)   SpO2 97%   BMI 36.83 kg/m  Wt Readings from Last 3 Encounters:  07/05/18 188 lb 9.6 oz (85.5 kg)  06/07/18 189 lb (85.7 kg)  04/28/18 187 lb 9.6 oz (85.1 kg)   No psoriasis plaques.  Only mild skin redness and excoriation at elbows.   Abd benign Pelvic.  Cystocoel.  Introitus moist.  Urethral erythema.   Wet prep is noninfectious.  Health Maintenance Due  Topic Date Due  . OPHTHALMOLOGY EXAM  09/29/2017  . FOOT EXAM  06/17/2018    There are no preventive care reminders to display for this patient.  Lab  Results  Component Value Date   TSH 3.160 11/06/2016   Lab Results  Component Value Date   WBC 6.2 04/02/2017   HGB 11.9 04/02/2017   HCT 37.4 04/02/2017   MCV 87 04/02/2017   PLT 180 04/02/2017   Lab Results  Component Value Date   NA 143 11/06/2016   K 4.5 11/06/2016   CO2 23 11/06/2016   GLUCOSE 61 (L) 11/06/2016   BUN 20 11/06/2016   CREATININE 1.07 (H) 11/06/2016   BILITOT 0.4 11/06/2016   ALKPHOS 69 11/06/2016   AST 41 (H) 11/06/2016   ALT 24 11/06/2016   PROT 7.2 11/06/2016   ALBUMIN 4.7 11/06/2016   CALCIUM 10.1 11/06/2016   ANIONGAP 8 07/30/2015   Lab Results  Component Value Date   CHOL 170 11/06/2016   Lab Results  Component Value Date   HDL 56 11/06/2016   Lab Results  Component Value Date   LDLCALC 85 11/06/2016   Lab Results  Component Value Date   TRIG 146 11/06/2016   Lab Results  Component Value Date   CHOLHDL 3.0 11/06/2016   Lab Results  Component Value Date   HGBA1C 8.6 (A) 06/07/2018      Assessment & Plan:   Problem List Items Addressed This Visit    Vaginitis   Relevant Medications   conjugated estrogens (PREMARIN) vaginal cream   Urinary incontinence   Psoriasis of scalp   Relevant Medications   hydrOXYzine (ATARAX/VISTARIL) 10 MG tablet  Constipation   Relevant Medications   polyethylene glycol powder (GLYCOLAX/MIRALAX) powder    Other Visit Diagnoses    Vaginal odor    -  Primary   Relevant Orders   POCT Wet Prep [facility]) (Completed)      Meds ordered this encounter  Medications  . hydrOXYzine (ATARAX/VISTARIL) 10 MG tablet    Sig: Take 1 tablet (10 mg total) by mouth 3 (three) times daily.    Dispense:  90 tablet    Refill:  3  . polyethylene glycol powder (GLYCOLAX/MIRALAX) powder    Sig: Take 17 g by mouth daily.    Dispense:  3350 g    Refill:  1  . conjugated estrogens (PREMARIN) vaginal cream    Sig: Place 1 Applicatorful vaginally daily.    Dispense:  42.5 g    Refill:  12    Follow-up: No  follow-ups on file.    Zenia Resides, MD

## 2018-07-06 NOTE — Telephone Encounter (Signed)
Received fax from pharmacy, PA needed on Hydroxizine.  Clinical questions submitted via Cover My Meds.  Waiting on response, could take up to 72 hours.  Cover My Meds info: Key: [id number]   Copy of clinical questions answered by Hensel placed in batch scanning.   Fleeger, Salome Spotted, CMA

## 2018-07-06 NOTE — Assessment & Plan Note (Signed)
>>  ASSESSMENT AND PLAN FOR ADJUSTMENT REACTION WITH ANXIETY AND DEPRESSION WRITTEN ON 07/06/2018  4:33 PM BY HENSEL, ELSIE LABOR, MD  Hydroxycine despite it being a Beers drug.  Low dose.

## 2018-07-06 NOTE — Assessment & Plan Note (Signed)
Will Rx pruritis and anxiety with low dose hydroxycine despite it being a Beers drug.

## 2018-07-06 NOTE — Assessment & Plan Note (Signed)
Hydroxycine despite it being a Beers drug.  Low dose.

## 2018-07-07 ENCOUNTER — Ambulatory Visit: Payer: PPO | Admitting: Family Medicine

## 2018-07-12 NOTE — Telephone Encounter (Signed)
Still awaiting decision via covermymeds.  Will continue to check status. [name], [name], CMA

## 2018-07-12 NOTE — Telephone Encounter (Signed)
I got a fax last week and patient has the prescription.

## 2018-07-12 NOTE — Telephone Encounter (Signed)
PERFECT!  Thanks for letting us know  Jeselle Hiser, Salome Spotted, [place]

## 2018-08-13 ENCOUNTER — Other Ambulatory Visit: Payer: Self-pay | Admitting: Family Medicine

## 2018-08-16 ENCOUNTER — Other Ambulatory Visit: Payer: Self-pay

## 2018-08-16 ENCOUNTER — Encounter: Payer: Self-pay | Admitting: Family Medicine

## 2018-08-16 ENCOUNTER — Ambulatory Visit (INDEPENDENT_AMBULATORY_CARE_PROVIDER_SITE_OTHER): Payer: PPO | Admitting: Family Medicine

## 2018-08-16 VITALS — BP 136/74 | HR 92 | Temp 98.1°F | Ht 60.0 in | Wt 186.6 lb

## 2018-08-16 DIAGNOSIS — N8110 Cystocele, unspecified: Secondary | ICD-10-CM | POA: Insufficient documentation

## 2018-08-16 DIAGNOSIS — E119 Type 2 diabetes mellitus without complications: Secondary | ICD-10-CM

## 2018-08-16 DIAGNOSIS — L309 Dermatitis, unspecified: Secondary | ICD-10-CM | POA: Diagnosis not present

## 2018-08-16 NOTE — Patient Instructions (Addendum)
Please get back on the estrogen cream for your vagina.  Use three nights per week now and in April you can cut back to once a week.   Someone should call about an appointment with a gynecologist to discuss options including a pessary. Stop the hydroxycine since it is not working. Try humidifying the air.  Also try baking soda bath.

## 2018-08-17 ENCOUNTER — Encounter: Payer: Self-pay | Admitting: Family Medicine

## 2018-08-17 NOTE — Assessment & Plan Note (Signed)
She is on the spectrum of dry skin/eczema.  Will stop hydroxycine - a Beers drug.  Focus on topical care.  Stressful at home with husband's illness and retirement.  We discussed how stress likely contributing.  Stop scratching.

## 2018-08-17 NOTE — Assessment & Plan Note (Signed)
Discussed that Bladder prolapse will be incompletely treated by topical estrogens, which she should continue.  Discussed possibilities of pessary and surgery.  She would like gyn referral and likely would like to try a pessary.

## 2018-08-17 NOTE — Progress Notes (Signed)
Established Patient Office Visit  Subjective:  Patient ID: [name], female    DOB: 06/06/1941  Age: 78 y.o. MRN: [id number]  CC: No chief complaint on file.   HPI [name] presents for follow up two issues.   1. Vaginal odor/irritation.  I examined last visit and found a cystocoel and no infection.  I treated her with estrogen cream .  States minimal to no improvement.  While I did not see prolapse on exam, she states she has some on standing/straining.  2. Full body itching.  Did not benefit from hydroxycine which is a known Beers drug.  No rash.  Past Medical History:  Diagnosis Date  . Asthma   . Hypertension   . Migraine     Past Surgical History:  Procedure Laterality Date  . ABDOMINAL HYSTERECTOMY    . APPENDECTOMY    . Arm surgery    . BREAST BIOPSY Right 10/10/2009  . BREAST CYST ASPIRATION  12/20/2013  . BREAST EXCISIONAL BIOPSY Left 1998  . BREAST EXCISIONAL BIOPSY Right 1993    Family History  Problem Relation Age of Onset  . Hearing loss Sister   . Heart attack Sister 25       s/p 4 bypasses   . Stroke Brother        35  . Breast cancer Maternal Aunt     Social History   Socioeconomic History  . Marital status: Married    Spouse name: Not on file  . Number of children: Not on file  . Years of education: Not on file  . Highest education level: Not on file  Occupational History  . Not on file  Social Needs  . Financial resource strain: Not on file  . Food insecurity:    Worry: Not on file    Inability: Not on file  . Transportation needs:    Medical: Not on file    Non-medical: Not on file  Tobacco Use  . Smoking status: Former Smoker    Packs/day: 2.00    Years: 33.00    Pack years: 66.00    Types: Cigarettes    Start date: 08/11/1957    Last attempt to quit: 04/04/1991    Years since quitting: 27.3  . Smokeless tobacco: Never Used  Substance and Sexual Activity  . Alcohol use: No  . Drug use: No  . Sexual activity: Not on  file  Lifestyle  . Physical activity:    Days per week: Not on file    Minutes per session: Not on file  . Stress: Not on file  Relationships  . Social connections:    Talks on phone: Not on file    Gets together: Not on file    Attends religious service: Not on file    Active member of club or organization: Not on file    Attends meetings of clubs or organizations: Not on file    Relationship status: Not on file  . Intimate partner violence:    Fear of current or ex partner: Not on file    Emotionally abused: Not on file    Physically abused: Not on file    Forced sexual activity: Not on file  Other Topics Concern  . Not on file  Social History Narrative  . Not on file    Outpatient Medications Prior to Visit  Medication Sig Dispense Refill  . albuterol (PROVENTIL HFA;VENTOLIN HFA) 108 (90 Base) MCG/ACT inhaler Inhale 2 puffs into the lungs every  6 (six) hours as needed for wheezing or shortness of breath. 1 Inhaler 0  . aspirin 81 MG chewable tablet Chew 81 mg by mouth daily.      . Blood Glucose Monitoring Suppl (ONETOUCH VERIO) w/Device KIT 1 kit by Does not apply route daily. 1 kit 0  . conjugated estrogens (PREMARIN) vaginal cream Place 1 Applicatorful vaginally daily. 42.5 g 12  . DULoxetine (CYMBALTA) 30 MG capsule Take 1 capsule (30 mg total) by mouth daily. 90 capsule 3  . enalapril (VASOTEC) 10 MG tablet TAKE ONE TABLET BY MOUTH ONCE DAILY 90 tablet 3  . famotidine (PEPCID) 40 MG tablet Take 40 mg by mouth every evening.     . hydrOXYzine (ATARAX/VISTARIL) 10 MG tablet Take 1 tablet (10 mg total) by mouth 3 (three) times daily. 90 tablet 3  . ipratropium (ATROVENT) 0.06 % nasal spray Place 2 sprays into both nostrils 4 (four) times daily. 15 mL 0  . Lancets (ONETOUCH DELICA PLUS [id number]) MISC TEST BLOOD SUGAR DAILY 100 each 0  . loratadine (CLARITIN) 10 MG tablet Take 10 mg by mouth daily.    . metFORMIN (GLUCOPHAGE) 500 MG tablet Take 1 tablet (500 mg total) by  mouth 2 (two) times daily with a meal. 180 tablet 3  . Multiple Vitamin (MULTIVITAMIN WITH MINERALS) TABS tablet Take 1 tablet by mouth daily.    . nortriptyline (PAMELOR) 50 MG capsule TAKE ONE CAPSULE BY MOUTH TWICE DAILY 180 capsule 3  . Omega-3 Fatty Acids (COROMEGA OMEGA 3 SQUEEZE) EMUL Take 1 application by mouth daily.    . ondansetron (ZOFRAN) 4 MG tablet TAKE 1 TABLET BY MOUTH EVERY 8 HOURS AS NEEDED FOR  NAUSEA  OR  VOMITING 20 tablet 3  . ONETOUCH VERIO test strip USE  STRIP TO CHECK GLUCOSE ONCE DAILY 100 each 3  . polyethylene glycol powder (GLYCOLAX/MIRALAX) powder Take 17 g by mouth daily. 3350 g 1  . simvastatin (ZOCOR) 20 MG tablet TAKE ONE-HALF TABLET BY MOUTH ONCE DAILY 45 tablet 3  . topiramate (TOPAMAX) 50 MG tablet TAKE 3 TABLETS BY MOUTH AT BEDTIME 270 tablet 3  . traMADol (ULTRAM) 50 MG tablet Take 1 tablet (50 mg total) by mouth every 8 (eight) hours as needed. 100 tablet 3  . vitamin C (ASCORBIC ACID) 250 MG tablet Take 500 mg by mouth daily.      No facility-administered medications prior to visit.     Allergies  Allergen Reactions  . Amoxicillin Rash  . Azithromycin Rash    Itching/rash  . Butalbital Other (See Comments)    Reaction: trouble breathing, but uncertain  . Clindamycin Hcl Rash  . Hydrocodone-Acetaminophen Rash    REACTION: developed rash  . Januvia [Sitagliptin] Other (See Comments)    Worsened migraines.  Mack Hook [Levofloxacin In D5w] Diarrhea  . Meperidine Hcl Other (See Comments)    REACTION: itching and hallucinations  . Sulfamethoxazole Rash  . Ketorolac Swelling    Received ketorolac, methocarbamal, and lidocaine patch all at the same time.  Lip swelling 12 hours later  . Lidocaine Swelling    Received ketorolac, methocarbamal, and lidocaine patch all at the same time.  Lip swelling 12 hours later  . Macrodantin [Nitrofurantoin Macrocrystal] Itching  . Methocarbamol Swelling    Received ketorolac, methocarbamal, and lidocaine  patch all at the same time.  Lip swelling 12 hours later  . Codeine Phosphate Other (See Comments)    REACTION: unknown  . Etodolac Other (See Comments)  REACTION: stomach upset  . Gabapentin Other (See Comments)    REACTION: mental clouding  Does not want to try again even in low dose.  . Naproxen Other (See Comments)    REACTION: stomach upset  . Oxycodone Other (See Comments)    Reaction: Stomach upset  . Pentazocine-Naloxone Other (See Comments)    REACTION: unspecified  . Sumatriptan Other (See Comments)    REACTION: chest pain  . Zonegran Other (See Comments)    Reaction: Unknown    ROS Review of Systems    Objective:    Physical Exam  BP 136/74   Pulse 92   Temp 98.1 F (36.7 C) (Oral)   Ht 5' (1.524 m)   Wt 186 lb 9.6 oz (84.6 kg)   SpO2 93%   BMI 36.44 kg/m  Wt Readings from Last 3 Encounters:  08/16/18 186 lb 9.6 oz (84.6 kg)  07/05/18 188 lb 9.6 oz (85.5 kg)  06/07/18 189 lb (85.7 kg)   Skin, no rash, but skin is dry Abd benign.  Health Maintenance Due  Topic Date Due  . OPHTHALMOLOGY EXAM  09/29/2017  . FOOT EXAM  06/17/2018    There are no preventive care reminders to display for this patient.  Lab Results  Component Value Date   TSH 3.160 11/06/2016   Lab Results  Component Value Date   WBC 6.2 04/02/2017   HGB 11.9 04/02/2017   HCT 37.4 04/02/2017   MCV 87 04/02/2017   PLT 180 04/02/2017   Lab Results  Component Value Date   NA 143 11/06/2016   K 4.5 11/06/2016   CO2 23 11/06/2016   GLUCOSE 61 (L) 11/06/2016   BUN 20 11/06/2016   CREATININE 1.07 (H) 11/06/2016   BILITOT 0.4 11/06/2016   ALKPHOS 69 11/06/2016   AST 41 (H) 11/06/2016   ALT 24 11/06/2016   PROT 7.2 11/06/2016   ALBUMIN 4.7 11/06/2016   CALCIUM 10.1 11/06/2016   ANIONGAP 8 07/30/2015   Lab Results  Component Value Date   CHOL 170 11/06/2016   Lab Results  Component Value Date   HDL 56 11/06/2016   Lab Results  Component Value Date   LDLCALC 85  11/06/2016   Lab Results  Component Value Date   TRIG 146 11/06/2016   Lab Results  Component Value Date   CHOLHDL 3.0 11/06/2016   Lab Results  Component Value Date   HGBA1C 8.6 (A) 06/07/2018      Assessment & Plan:   Problem List Items Addressed This Visit    Non-insulin dependent type 2 diabetes mellitus ([place]) - Primary   Bladder prolapse, female, acquired   Relevant Orders   Ambulatory referral to Gynecology      No orders of the defined types were placed in this encounter.   Follow-up: No follow-ups on file.    Zenia Resides, MD

## 2018-08-18 ENCOUNTER — Encounter: Payer: Self-pay | Admitting: Obstetrics and Gynecology

## 2018-08-27 ENCOUNTER — Other Ambulatory Visit: Payer: Self-pay

## 2018-08-27 ENCOUNTER — Encounter: Payer: Self-pay | Admitting: Obstetrics and Gynecology

## 2018-08-27 ENCOUNTER — Ambulatory Visit (INDEPENDENT_AMBULATORY_CARE_PROVIDER_SITE_OTHER): Payer: PPO | Admitting: Obstetrics and Gynecology

## 2018-08-27 VITALS — BP 152/78 | HR 84 | Resp 16 | Ht 58.75 in | Wt 186.6 lb

## 2018-08-27 DIAGNOSIS — R102 Pelvic and perineal pain: Secondary | ICD-10-CM

## 2018-08-27 DIAGNOSIS — N898 Other specified noninflammatory disorders of vagina: Secondary | ICD-10-CM

## 2018-08-27 DIAGNOSIS — R8290 Unspecified abnormal findings in urine: Secondary | ICD-10-CM

## 2018-08-27 DIAGNOSIS — K5900 Constipation, unspecified: Secondary | ICD-10-CM | POA: Diagnosis not present

## 2018-08-27 LAB — POCT URINALYSIS DIPSTICK
Bilirubin, UA: NEGATIVE
Blood, UA: NEGATIVE
Glucose, UA: NEGATIVE
Ketones, UA: NEGATIVE
Nitrite, UA: NEGATIVE
Protein, UA: NEGATIVE
Urobilinogen, UA: 0.2 E.U./dL
pH, UA: 5 (ref 5.0–8.0)

## 2018-08-27 MED ORDER — CEPHALEXIN 500 MG PO CAPS: 500.0000 mg | ORAL_CAPSULE | Freq: Four times a day (QID) | ORAL | 0 refills | Status: DC

## 2018-08-27 NOTE — Patient Instructions (Addendum)
For constipation, try Colace or Pericolace to soften the stools.  Colace will be more mild.  Bacterial Vaginosis  Bacterial vaginosis is a vaginal infection that occurs when the normal balance of bacteria in the vagina is disrupted. It results from an overgrowth of certain bacteria. This is the most common vaginal infection among women ages 58-44. Because bacterial vaginosis increases your risk for STIs (sexually transmitted infections), getting treated can help reduce your risk for chlamydia, gonorrhea, herpes, and HIV (human immunodeficiency virus). Treatment is also important for preventing complications in pregnant women, because this condition can cause an early (premature) delivery. What are the causes? This condition is caused by an increase in harmful bacteria that are normally present in small amounts in the vagina. However, the reason that the condition develops is not fully understood. What increases the risk? The following factors may make you more likely to develop this condition:  Having a new sexual partner or multiple sexual partners.  Having unprotected sex.  Douching.  Having an intrauterine device (IUD).  Smoking.  Drug and alcohol abuse.  Taking certain antibiotic medicines.  Being pregnant. You cannot get bacterial vaginosis from toilet seats, bedding, swimming pools, or contact with objects around you. What are the signs or symptoms? Symptoms of this condition include:  Grey or white vaginal discharge. The discharge can also be watery or foamy.  A fish-like odor with discharge, especially after sexual intercourse or during menstruation.  Itching in and around the vagina.  Burning or pain with urination. Some women with bacterial vaginosis have no signs or symptoms. How is this diagnosed? This condition is diagnosed based on:  Your medical history.  A physical exam of the vagina.  Testing a sample of vaginal fluid under a microscope to look for a  large amount of bad bacteria or abnormal cells. Your health care provider may use a cotton swab or a small wooden spatula to collect the sample. How is this treated? This condition is treated with antibiotics. These may be given as a pill, a vaginal cream, or a medicine that is put into the vagina (suppository). If the condition comes back after treatment, a second round of antibiotics may be needed. Follow these instructions at home: Medicines  Take over-the-counter and prescription medicines only as told by your health care provider.  Take or use your antibiotic as told by your health care provider. Do not stop taking or using the antibiotic even if you start to feel better. General instructions  If you have a female sexual partner, tell her that you have a vaginal infection. She should see her health care provider and be treated if she has symptoms. If you have a female sexual partner, he does not need treatment.  During treatment: ? Avoid sexual activity until you finish treatment. ? Do not douche. ? Avoid alcohol as directed by your health care provider. ? Avoid breastfeeding as directed by your health care provider.  Drink enough water and fluids to keep your urine clear or pale yellow.  Keep the area around your vagina and rectum clean. ? Wash the area daily with warm water. ? Wipe yourself from front to back after using the toilet.  Keep all follow-up visits as told by your health care provider. This is important. How is this prevented?  Do not douche.  Wash the outside of your vagina with warm water only.  Use protection when having sex. This includes latex condoms and dental dams.  Limit how many sexual partners  you have. To help prevent bacterial vaginosis, it is best to have sex with just one partner (monogamous).  Make sure you and your sexual partner are tested for STIs.  Wear cotton or cotton-lined underwear.  Avoid wearing tight pants and pantyhose, especially  during summer.  Limit the amount of alcohol that you drink.  Do not use any products that contain nicotine or tobacco, such as cigarettes and e-cigarettes. If you need help quitting, ask your health care provider.  Do not use illegal drugs. Where to find more information  Centers for Disease Control and Prevention: AppraiserFraud.fi  American Sexual Health Association (ASHA): www.ashastd.org  [place] Department of Health and Financial controller, Office on Women's Health: DustingSprays.pl or SecuritiesCard.it Contact a health care provider if:  Your symptoms do not improve, even after treatment.  You have more discharge or pain when urinating.  You have a fever.  You have pain in your abdomen.  You have pain during sex.  You have vaginal bleeding between periods. Summary  Bacterial vaginosis is a vaginal infection that occurs when the normal balance of bacteria in the vagina is disrupted.  Because bacterial vaginosis increases your risk for STIs (sexually transmitted infections), getting treated can help reduce your risk for chlamydia, gonorrhea, herpes, and HIV (human immunodeficiency virus). Treatment is also important for preventing complications in pregnant women, because the condition can cause an early (premature) delivery.  This condition is treated with antibiotic medicines. These may be given as a pill, a vaginal cream, or a medicine that is put into the vagina (suppository). This information is not intended to replace advice given to you by your health care provider. Make sure you discuss any questions you have with your health care provider. Document Released: 07/28/2005 Document Revised: 12/01/2016 Document Reviewed: 04/12/2016 Elsevier Interactive Patient Education  2019 [employer].    Constipation, Adult Constipation is when a person has fewer bowel movements in a week than normal, has difficulty having a bowel movement, or  has stools that are dry, hard, or larger than normal. Constipation may be caused by an underlying condition. It may become worse with age if a person takes certain medicines and does not take in enough fluids. Follow these instructions at home: Eating and drinking   Eat foods that have a lot of fiber, such as fresh fruits and vegetables, whole grains, and beans.  Limit foods that are high in fat, low in fiber, or overly processed, such as french fries, hamburgers, cookies, candies, and soda.  Drink enough fluid to keep your urine clear or pale yellow. General instructions  Exercise regularly or as told by your health care provider.  Go to the restroom when you have the urge to go. Do not hold it in.  Take over-the-counter and prescription medicines only as told by your health care provider. These include any fiber supplements.  Practice pelvic floor retraining exercises, such as deep breathing while relaxing the lower abdomen and pelvic floor relaxation during bowel movements.  Watch your condition for any changes.  Keep all follow-up visits as told by your health care provider. This is important. Contact a health care provider if:  You have pain that gets worse.  You have a fever.  You do not have a bowel movement after 4 days.  You vomit.  You are not hungry.  You lose weight.  You are bleeding from the anus.  You have thin, pencil-like stools. Get help right away if:  You have a fever and your  symptoms suddenly get worse.  You leak stool or have blood in your stool.  Your abdomen is bloated.  You have severe pain in your abdomen.  You feel dizzy or you faint. This information is not intended to replace advice given to you by your health care provider. Make sure you discuss any questions you have with your health care provider. Document Released: 04/25/2004 Document Revised: 02/15/2016 Document Reviewed: 01/16/2016 Elsevier Interactive Patient Education  2019  [employer].

## 2018-08-27 NOTE — Progress Notes (Signed)
78 y.o. G11P1001 Married Caucasian female here for problem visit.   States she stands and she has urinary incontinence for a long time.  Leaks with laugh, cough, and sneeze.  Can leak for no reason.  Wears an incontinence pad and goes right through them. Thinks she does not empty well.     DF - eradic.  NF - usually once.  Her incontinence pads are saturated sometimes.   No hematuria.  No renal stones.   Always constipated.  BM every 3 - 4 days.   Had a terrible pelvic pressure and pain in the vagina yesterday and felt like everything was going to fall out.  Tramadol for her back pain made the vaginal area improved.  States she has chronic back pain.   She reports hx of UTIs.  Having vaginal odor. The odor is going on for 6 months.  Does have urinary odor.   Las A1C was 7.  Now back on Metformin.   Husband has cancer and about 18 months to live per patient.   Urine dip - 1+ WBC.   PCP:  Madison Hickman, MD  No LMP recorded. Patient has had a hysterectomy.           Sexually active: No.  The current method of family planning is status post hysterectomy--?left ovary remains.    Exercising: No.  The patient does not participate in regular exercise at present. Smoker:  Former  Health Maintenance: Pap: 20 years ago History of abnormal Pap:  no MMG: 01-08-16 Neg/density B/BiRads1 Colonoscopy:  2017 normal--no further screening due to age BMD:   2018  Result  :normal per patient TDaP: ?11-09-97 Gardasil:   no HIV: no Hep C:no Screening Labs:  Hb today: PCP  reports that she quit smoking about 27 years ago. Her smoking use included cigarettes. She started smoking about 61 years ago. She has a 66.00 pack-year smoking history. She has never used smokeless tobacco. She reports that she does not drink alcohol or use drugs.  Past Medical History:  Diagnosis Date  . Asthma   . Depression   . Diabetes mellitus without complication ([place])   . Hypertension   . Migraine    without aura  . Urinary incontinence     Past Surgical History:  Procedure Laterality Date  . ABDOMINAL HYSTERECTOMY     ?Lt. ovary remains  . APPENDECTOMY    . Arm surgery    . BREAST BIOPSY Right 10/10/2009  . BREAST CYST ASPIRATION  12/20/2013  . BREAST EXCISIONAL BIOPSY Left 1998  . BREAST EXCISIONAL BIOPSY Right 1993    Current Outpatient Medications  Medication Sig Dispense Refill  . DULoxetine (CYMBALTA) 30 MG capsule Take 1 capsule (30 mg total) by mouth daily. 90 capsule 3  . enalapril (VASOTEC) 10 MG tablet TAKE ONE TABLET BY MOUTH ONCE DAILY 90 tablet 3  . famotidine (PEPCID) 40 MG tablet Take 40 mg by mouth every evening.     . Lancets (ONETOUCH DELICA PLUS [id number]) MISC TEST BLOOD SUGAR DAILY 100 each 0  . loratadine (CLARITIN) 10 MG tablet Take 10 mg by mouth daily.    . metFORMIN (GLUCOPHAGE) 500 MG tablet Take 1 tablet (500 mg total) by mouth 2 (two) times daily with a meal. 180 tablet 3  . Multiple Vitamin (MULTIVITAMIN WITH MINERALS) TABS tablet Take 1 tablet by mouth daily.    . nortriptyline (PAMELOR) 50 MG capsule TAKE ONE CAPSULE BY MOUTH TWICE DAILY 180 capsule 3  . ondansetron (  ZOFRAN) 4 MG tablet TAKE 1 TABLET BY MOUTH EVERY 8 HOURS AS NEEDED FOR  NAUSEA  OR  VOMITING 20 tablet 3  . ONETOUCH VERIO test strip USE  STRIP TO CHECK GLUCOSE ONCE DAILY 100 each 3  . simvastatin (ZOCOR) 20 MG tablet TAKE ONE-HALF TABLET BY MOUTH ONCE DAILY 45 tablet 3  . topiramate (TOPAMAX) 50 MG tablet TAKE 3 TABLETS BY MOUTH AT BEDTIME 270 tablet 3  . traMADol (ULTRAM) 50 MG tablet Take 1 tablet (50 mg total) by mouth every 8 (eight) hours as needed. 100 tablet 3  . vitamin C (ASCORBIC ACID) 250 MG tablet Take 500 mg by mouth daily.     Marland Kitchen conjugated estrogens (PREMARIN) vaginal cream Place 1 Applicatorful vaginally daily. (Patient not taking: Reported on 08/27/2018) 42.5 g 12   No current facility-administered medications for this visit.     Family History  Problem Relation  Age of Onset  . Hypertension Mother   . Hypertension Father   . Hearing loss Sister   . Heart attack Sister 32       s/p 4 bypasses   . Stroke Brother        61  . Breast cancer Maternal Aunt   . Ovarian cancer Maternal Aunt     Review of Systems  Genitourinary:       Stress incontinence  All other systems reviewed and are negative.   Exam:   BP (!) 152/78 (BP Location: Right Arm, Patient Position: Sitting, Cuff Size: Large)   Pulse 84   Resp 16   Ht 4' 10.75" (1.492 m)   Wt 186 lb 9.6 oz (84.6 kg)   BMI 38.01 kg/m     General appearance: alert, cooperative and appears stated age Head: Normocephalic, without obvious abnormality, atraumatic Neck: no adenopathy, supple, symmetrical, trachea midline and thyroid normal to inspection and palpation Lungs: clear to auscultation bilaterally Heart: regular rate and rhythm Abdomen: soft, non-tender; no masses, no organomegaly Extremities: extremities normal, atraumatic, no cyanosis or edema Skin: Skin color, texture, turgor normal. No rashes or lesions Lymph nodes: Cervical, supraclavicular, and axillary nodes normal. No abnormal inguinal nodes palpated Neurologic: Grossly normal  Pelvic: External genitalia:  no lesions              Urethra:  normal appearing urethra with no masses, tenderness or lesions              Bartholins and Skenes: normal                 Vagina: normal appearing vagina with normal color and discharge, no lesions              Cervix:  absent              Pap taken: No. Bimanual Exam:  Uterus:   Absent.              Adnexa: no mass, fullness, tenderness              Rectal exam: Yes.  .  Confirms.              Anus:  normal sphincter tone, no lesions  Chaperone was present for exam.  Assessment:     Status post TAH for abnormal uterine bleeding.   Left ovary remains.  DM.   Urinary incontinence.   Likely has mixed incontinence.  Pelvic pressure.  Vaginal odor.  No significant prolapse.  Dysuria  and abnormal urine.  Constipation. Multiple allergies.  Plan:   Urine micro and cx.  Affirm.  Start Keflex.  Discussed incontinence.  Discussed constipation.  Start stool softener.  We discussed Impressa, pessaries, medication for overactive bladder, and PT.  I am not recommending surgery at this time due to patient's needs to care for her husband. She will consider her options.    Follow up annually and prn.   After visit summary provided.   ___30____ minutes face to face time of which over 50% was spent in counseling.

## 2018-08-28 LAB — URINALYSIS, MICROSCOPIC ONLY
Casts: NONE SEEN /lpf
WBC, UA: >30 /hpf — AB (ref 0–5)

## 2018-08-28 LAB — VAGINITIS/VAGINOSIS, DNA PROBE
Candida Species: NEGATIVE
Gardnerella vaginalis: NEGATIVE
Trichomonas vaginosis: NEGATIVE

## 2018-08-29 LAB — URINE CULTURE

## 2018-09-01 ENCOUNTER — Telehealth: Payer: Self-pay | Admitting: Obstetrics and Gynecology

## 2018-09-01 NOTE — Telephone Encounter (Signed)
Spoke with patient, advised of results as seen below per Dr. Quincy Simmonds. Patient reports she is feeling better symptoms are resolving. Patient aware to complete abx, return call to office if any additional questions/concerns. Patient verbalizes understanding.   Encounter closed.

## 2018-09-01 NOTE — Telephone Encounter (Signed)
Notes recorded by Nunzio Cobbs, MD on 08/30/2018 at 5:03 PM EST Results to patient through My Chart.  Hello [name],   Your urine culture showed E Coli bacteria, which is sensitive to the Keflex.  This means the Keflex is a good choice to treat this infection.  Please let me know if you are not feeling better.   Your vaginitis testing is negative for infection.   Please contact the office for any questions.   Have a good evening!  [name], MD

## 2018-09-01 NOTE — Telephone Encounter (Signed)
Spoke with patient. Patient had additional questions about  Ecoli UTI and prevention. Reviewed increasing fluids, proper peri care and  UTI prevention. Questions answered.   Routing to provider for final review. Patient is agreeable to disposition. Will close encounter.

## 2018-09-01 NOTE — Telephone Encounter (Signed)
Patient is calling for recent urine culture results.

## 2018-09-01 NOTE — Telephone Encounter (Signed)
Patient has questions about her results.

## 2018-09-08 ENCOUNTER — Telehealth: Payer: Self-pay | Admitting: Obstetrics and Gynecology

## 2018-09-08 NOTE — Telephone Encounter (Signed)
Patient says she still has no control over her bladder.

## 2018-09-08 NOTE — Telephone Encounter (Signed)
Call to patient. Patient states she was recently treated for E. Coli. States she completed the Keflex as prescribed on Sunday and the urinary odor is gone, but she feels that her symptoms of incontinence are "even worse." States when she got out of the shower today, "it was so bad it was coming down my legs." RN advised OV recommended for further evaluation. Patient requesting appointment tomorrow as her family only has one car. Reviewed with [name], CMA. Patient scheduled for 09-09-2018 at 1130. Patient agreeable to date and time of appointment.   Routing to provider and will close encounter.

## 2018-09-09 ENCOUNTER — Ambulatory Visit (INDEPENDENT_AMBULATORY_CARE_PROVIDER_SITE_OTHER): Payer: PPO | Admitting: Obstetrics and Gynecology

## 2018-09-09 ENCOUNTER — Encounter: Payer: Self-pay | Admitting: Obstetrics and Gynecology

## 2018-09-09 VITALS — BP 118/62 | HR 64 | Temp 98.1°F | Resp 14 | Ht 58.75 in | Wt 186.0 lb

## 2018-09-09 DIAGNOSIS — R350 Frequency of micturition: Secondary | ICD-10-CM

## 2018-09-09 DIAGNOSIS — N3281 Overactive bladder: Secondary | ICD-10-CM | POA: Diagnosis not present

## 2018-09-09 LAB — POCT URINALYSIS DIPSTICK
Bilirubin, UA: NEGATIVE
Blood, UA: NEGATIVE
Glucose, UA: NEGATIVE
Ketones, UA: NEGATIVE
Leukocytes, UA: NEGATIVE
Nitrite, UA: NEGATIVE
Protein, UA: NEGATIVE
Urobilinogen, UA: 0.2 E.U./dL
pH, UA: 5 (ref 5.0–8.0)

## 2018-09-09 MED ORDER — OXYBUTYNIN CHLORIDE ER 5 MG PO TB24: 5.0000 mg | ORAL_TABLET | Freq: Every day | ORAL | 1 refills | Status: DC

## 2018-09-09 NOTE — Progress Notes (Signed)
GYNECOLOGY  VISIT   HPI: 78 y.o.   Married  Caucasian  female   G1P1001 with No LMP recorded. Patient has had a hysterectomy.   here for urinary symptoms that have progressed since being treated last.      Had E Coli UTI and was treated with Keflex.  Vaginitis testing negative on 08/27/18.   Denies dysuria.  Notices a slight odor.   Has stress incontinence but thinks she does not empty well and does not urinate straight.  She is not able to control her urine.  She got out of the shower and had total loss of control of her urine.  NF twice nightly.  Voiding 10 times during the day.   Last hs had stomach cramps and nausea.  No diarrhea.  No fevers.  She has these symptoms prior to starting her antibiotics.   Urine PVR - 30 cc.   Denies hx glaucoma.  Denies arrhythmia.  States she has dry mouth.  Drinks lemon water all day long.   Hgb A1c - 7.0. Glucose this am 78.  Husband with health issues that require patients presence.   Urine: Negative  GYNECOLOGIC HISTORY: No LMP recorded. Patient has had a hysterectomy. Contraception:  Hysterectomy Menopausal hormone therapy:  Premarin vaginal cream Last mammogram: 01-08-16 Neg/density B/BiRads1 Last pap smear: years ago        OB History    Gravida  1   Para  1   Term  1   Preterm      AB      Living  1     SAB      TAB      Ectopic      Multiple      Live Births           Obstetric Comments  Patient has 2 children--she adopted her husband's son           Patient Active Problem List   Diagnosis Date Noted  . Bladder prolapse, female, acquired 08/16/2018  . Urinary incontinence 07/05/2018  . Constipation 07/05/2018  . Vaginitis 06/07/2018  . Frequent falls 12/24/2017  . Pain of right thumb 12/23/2017  . Back pain 07/24/2017  . Rotator cuff syndrome, left 06/19/2017  . Adjustment reaction with anxiety and depression 03/26/2017  . Ruptured silicone breast implant 12/08/2016  . Iron deficiency  anemia due to chronic blood loss 10/31/2015  . Chronic right-sided thoracic back pain 07/26/2015  . Spinal stenosis of lumbar region 07/10/2015  . Abdominal aortic atherosclerosis ([place]) 07/10/2015  . Sleep apnea 05/22/2010  . PERIPHERAL NEUROPATHY 02/06/2010  . PULMONARY NODULE, SOLITARY 07/20/2009  . Irritable bowel syndrome 11/12/2007  . Non-insulin dependent type 2 diabetes mellitus ([place]) 10/08/2006  . HYPERCHOLESTEROLEMIA 10/08/2006  . HYPERTRIGLYCERIDEMIA 10/08/2006  . Migraine headache 10/08/2006  . CARPAL TUNNEL SYNDROME 10/08/2006  . HYPERTENSION, BENIGN SYSTEMIC 10/08/2006  . RHINITIS, ALLERGIC 10/08/2006  . ASTHMA, PERSISTENT, MODERATE 10/08/2006  . REFLUX ESOPHAGITIS 10/08/2006  . Eczema 10/08/2006  . Psoriasis 10/08/2006  . Osteoarthritis of spine 10/08/2006  . VERTIGO NOS OR DIZZINESS 10/08/2006    Past Medical History:  Diagnosis Date  . Asthma   . Depression   . Diabetes mellitus without complication ([place])   . Hypertension   . Migraine    without aura  . Urinary incontinence     Past Surgical History:  Procedure Laterality Date  . ABDOMINAL HYSTERECTOMY     ?Lt. ovary remains  . APPENDECTOMY    .  Arm surgery    . BREAST BIOPSY Right 10/10/2009  . BREAST CYST ASPIRATION  12/20/2013  . BREAST EXCISIONAL BIOPSY Left 1998  . BREAST EXCISIONAL BIOPSY Right 1993    Current Outpatient Medications  Medication Sig Dispense Refill  . cephALEXin (KEFLEX) 500 MG capsule Take 1 capsule (500 mg total) by mouth 4 (four) times daily. Take QID for 7 days. 28 capsule 0  . conjugated estrogens (PREMARIN) vaginal cream Place 1 Applicatorful vaginally daily. (Patient not taking: Reported on 08/27/2018) 42.5 g 12  . DULoxetine (CYMBALTA) 30 MG capsule Take 1 capsule (30 mg total) by mouth daily. 90 capsule 3  . enalapril (VASOTEC) 10 MG tablet TAKE ONE TABLET BY MOUTH ONCE DAILY 90 tablet 3  . famotidine (PEPCID) 40 MG tablet Take 40 mg by mouth every evening.     .  Lancets (ONETOUCH DELICA PLUS [id number]) MISC TEST BLOOD SUGAR DAILY 100 each 0  . loratadine (CLARITIN) 10 MG tablet Take 10 mg by mouth daily.    . metFORMIN (GLUCOPHAGE) 500 MG tablet Take 1 tablet (500 mg total) by mouth 2 (two) times daily with a meal. 180 tablet 3  . Multiple Vitamin (MULTIVITAMIN WITH MINERALS) TABS tablet Take 1 tablet by mouth daily.    . nortriptyline (PAMELOR) 50 MG capsule TAKE ONE CAPSULE BY MOUTH TWICE DAILY 180 capsule 3  . ondansetron (ZOFRAN) 4 MG tablet TAKE 1 TABLET BY MOUTH EVERY 8 HOURS AS NEEDED FOR  NAUSEA  OR  VOMITING 20 tablet 3  . ONETOUCH VERIO test strip USE  STRIP TO CHECK GLUCOSE ONCE DAILY 100 each 3  . simvastatin (ZOCOR) 20 MG tablet TAKE ONE-HALF TABLET BY MOUTH ONCE DAILY 45 tablet 3  . topiramate (TOPAMAX) 50 MG tablet TAKE 3 TABLETS BY MOUTH AT BEDTIME 270 tablet 3  . traMADol (ULTRAM) 50 MG tablet Take 1 tablet (50 mg total) by mouth every 8 (eight) hours as needed. 100 tablet 3  . vitamin C (ASCORBIC ACID) 250 MG tablet Take 500 mg by mouth daily.      No current facility-administered medications for this visit.      ALLERGIES: Amoxicillin; Azithromycin; Butalbital; Clindamycin hcl; Hydrocodone-acetaminophen; Januvia [sitagliptin]; Levaquin [levofloxacin in d5w]; Meperidine hcl; Sulfamethoxazole; Ketorolac; Lidocaine; Macrodantin [nitrofurantoin macrocrystal]; Methocarbamol; Codeine phosphate; Etodolac; Gabapentin; Naproxen; Oxycodone; Pentazocine-naloxone; Sumatriptan; and Zonegran  Family History  Problem Relation Age of Onset  . Hypertension Mother   . Hypertension Father   . Hearing loss Sister   . Heart attack Sister 19       s/p 4 bypasses   . Stroke Brother        72  . Breast cancer Maternal Aunt   . Ovarian cancer Maternal Aunt     Social History   Socioeconomic History  . Marital status: Married    Spouse name: Not on file  . Number of children: Not on file  . Years of education: Not on file  . Highest education  level: Not on file  Occupational History  . Not on file  Social Needs  . Financial resource strain: Not on file  . Food insecurity:    Worry: Not on file    Inability: Not on file  . Transportation needs:    Medical: Not on file    Non-medical: Not on file  Tobacco Use  . Smoking status: Former Smoker    Packs/day: 2.00    Years: 33.00    Pack years: 66.00    Types: Cigarettes  Start date: 08/11/1957    Last attempt to quit: 04/04/1991    Years since quitting: 27.4  . Smokeless tobacco: Never Used  Substance and Sexual Activity  . Alcohol use: No    Frequency: Never  . Drug use: No  . Sexual activity: Not Currently  Lifestyle  . Physical activity:    Days per week: Not on file    Minutes per session: Not on file  . Stress: Not on file  Relationships  . Social connections:    Talks on phone: Not on file    Gets together: Not on file    Attends religious service: Not on file    Active member of club or organization: Not on file    Attends meetings of clubs or organizations: Not on file    Relationship status: Not on file  . Intimate partner violence:    Fear of current or ex partner: Not on file    Emotionally abused: Not on file    Physically abused: Not on file    Forced sexual activity: Not on file  Other Topics Concern  . Not on file  Social History Narrative  . Not on file    Review of Systems  Constitutional: Negative.   HENT: Negative.   Eyes: Negative.   Respiratory: Negative.   Cardiovascular: Negative.   Gastrointestinal: Positive for abdominal pain and nausea.  Endocrine:       Excessive thirst   Genitourinary: Positive for frequency and urgency.       Loss of urine spontaneously Loss of urine with sneeze or cough Night urination Vulvar and vaginal itching   Musculoskeletal: Negative.   Skin:       Itching   Allergic/Immunologic: Negative.   Neurological: Positive for headaches.  Hematological: Negative.   Psychiatric/Behavioral:  Negative.     PHYSICAL EXAMINATION:    BP 118/62 (BP Location: Right Arm, Patient Position: Sitting, Cuff Size: Large)   Pulse 64   Temp 98.1 F (36.7 C) (Oral)   Resp 14   Ht 4' 10.75" (1.492 m)   Wt 186 lb (84.4 kg)   BMI 37.89 kg/m     General appearance: alert, cooperative and appears stated age   Pelvic: External genitalia:  no lesions              Urethra:  normal appearing urethra with no masses, tenderness or lesions              Bartholins and Skenes: normal                 Vagina: normal appearing vagina with normal color and discharge, no lesions              Cervix: no lesions                Bimanual Exam:  Uterus:  normal size, contour, position, consistency, mobility, non-tender              Adnexa: no mass, fullness, tenderness       Bladder catheterization:  Sterile prep with betadine.  Catheterization for 30 cc urine.   Chaperone was present for exam.  ASSESSMENT  Overactive bladder.  Stress incontinence.  Adequate voiding.  Negative urine dip.  Heavy use of citrus. Recent UTI.  Diabetes mellitus.    PLAN  Will repeat urine culture.  Plan to start Ditropan XL 5 mg daily after repeat UC is back and negative.  We discussed side effects including urinary retention.  We talked about reducing bladder irritants.   She is currently unable to do physical therapy due to her husband's medical needs.  Fu in 6 weeks.   An After Visit Summary was printed and given to the patient.  __25____ minutes face to face time of which over 50% was spent in counseling.

## 2018-09-09 NOTE — Patient Instructions (Signed)
Overactive Bladder, Adult  Overactive bladder refers to a condition in which a person has a sudden need to pass urine. The person may leak urine if he or she cannot get to the bathroom fast enough (urinary incontinence). A person with this condition may also wake up several times in the night to go to the bathroom. Overactive bladder is associated with poor nerve signals between your bladder and your brain. Your bladder may get the signal to empty before it is full. You may also have very sensitive muscles that make your bladder squeeze too soon. These symptoms might interfere with daily work or social activities. What are the causes? This condition may be associated with or caused by:  Urinary tract infection.  Infection of nearby tissues, such as the prostate.  Prostate enlargement.  Surgery on the uterus or urethra.  Bladder stones, inflammation, or tumors.  Drinking too much caffeine or alcohol.  Certain medicines, especially medicines that get rid of extra fluid in the body (diuretics).  Muscle or nerve weakness, especially from: ? A spinal cord injury. ? Stroke. ? Multiple sclerosis. ? Parkinson's disease.  Diabetes.  Constipation. What increases the risk? You may be at greater risk for overactive bladder if you:  Are an older adult.  Smoke.  Are going through menopause.  Have prostate problems.  Have a neurological disease, such as stroke, dementia, Parkinson's disease, or multiple sclerosis (MS).  Eat or drink things that irritate the bladder. These include alcohol, spicy food, and caffeine.  Are overweight or obese. What are the signs or symptoms? Symptoms of this condition include:  Sudden, strong urge to urinate.  Leaking urine.  Urinating 8 or more times a day.  Waking up to urinate 2 or more times a night. How is this diagnosed? Your health care provider may suspect overactive bladder based on your symptoms. He or she will diagnose this condition  by:  A physical exam and medical history.  Blood or urine tests. You might need bladder or urine tests to help determine what is causing your overactive bladder. You might also need to see a health care provider who specializes in urinary tract problems (urologist). How is this treated? Treatment for overactive bladder depends on the cause of your condition and whether it is mild or severe. You can also make lifestyle changes at home. Options include:  Bladder training. This may include: ? Learning to control the urge to urinate by following a schedule that directs you to urinate at regular intervals (timed voiding). ? Doing Kegel exercises to strengthen your pelvic floor muscles, which support your bladder. Toning these muscles can help you control urination, even if your bladder muscles are overactive.  Special devices. This may include: ? Biofeedback, which uses sensors to help you become aware of your body's signals. ? Electrical stimulation, which uses electrodes placed inside the body (implanted) or outside the body. These electrodes send gentle pulses of electricity to strengthen the nerves or muscles that control the bladder. ? Women may use a plastic device that fits into the vagina and supports the bladder (pessary).  Medicines. ? Antibiotics to treat bladder infection. ? Antispasmodics to stop the bladder from releasing urine at the wrong time. ? Tricyclic antidepressants to relax bladder muscles. ? Injections of botulinum toxin type A directly into the bladder tissue to relax bladder muscles.  Lifestyle changes. This may include: ? Weight loss. Talk to your health care provider about weight loss methods that would work best for you. ?   Diet changes. This may include reducing how much alcohol and caffeine you consume, or drinking fluids at different times of the day. °? Not smoking. Do not use any products that contain nicotine or tobacco, such as cigarettes and e-cigarettes. If  you need help quitting, ask your health care provider. °· Surgery. °? A device may be implanted to help manage the nerve signals that control urination. °? An electrode may be implanted to stimulate electrical signals in the bladder. °? A procedure may be done to change the shape of the bladder. This is done only in very severe cases. °Follow these instructions at home: °Lifestyle °· Make any diet or lifestyle changes that are recommended by your health care provider. These may include: °? Drinking less fluid or drinking fluids at different times of the day. °? Cutting down on caffeine or alcohol. °? Doing Kegel exercises. °? Losing weight if needed. °? Eating a healthy and balanced diet to prevent constipation. This may include: °§ Eating foods that are high in fiber, such as fresh fruits and vegetables, whole grains, and beans. °§ Limiting foods that are high in fat and processed sugars, such as fried and sweet foods. °General instructions °· Take over-the-counter and prescription medicines only as told by your health care provider. °· If you were prescribed an antibiotic medicine, take it as told by your health care provider. Do not stop taking the antibiotic even if you start to feel better. °· Use any implants or pessary as told by your health care provider. °· If needed, wear pads to absorb urine leakage. °· Keep a journal or log to track how much and when you drink and when you feel the need to urinate. This will help your health care provider monitor your condition. °· Keep all follow-up visits as told by your health care provider. This is important. °Contact a health care provider if: °· You have a fever. °· Your symptoms do not get better with treatment. °· Your pain and discomfort get worse. °· You have more frequent urges to urinate. °Get help right away if: °· You are not able to control your bladder. °Summary °· Overactive bladder refers to a condition in which a person has a sudden need to pass  urine. °· Several conditions may lead to an overactive bladder. °· Treatment for overactive bladder depends on the cause and severity of your condition. °· Follow your health care provider's instructions about lifestyle changes, doing Kegel exercises, keeping a journal, and taking medicines. °This information is not intended to replace advice given to you by your health care provider. Make sure you discuss any questions you have with your health care provider. °Document Released: 05/24/2009 Document Revised: 08/13/2017 Document Reviewed: 08/13/2017 °Elsevier Interactive Patient Education © 2019 [employer] ° °Oxybutynin extended-release tablets °What is this medicine? °OXYBUTYNIN (ox i BYOO ti nin) is used to treat overactive bladder. This medicine reduces the amount of bathroom visits. It may also help to control wetting accidents. °This medicine may be used for other purposes; ask your health care provider or pharmacist if you have questions. °COMMON BRAND NAME(S): Ditropan XL °What should I tell my health care provider before I take this medicine? °They need to know if you have any of these conditions: °-autonomic neuropathy °-dementia °-difficulty passing urine °-glaucoma °-intestinal obstruction °-kidney disease °-liver disease °-myasthenia gravis °-Parkinson's disease °-an unusual or allergic reaction to oxybutynin, other medicines, foods, dyes, or preservatives °-pregnant or trying to get pregnant °-breast-feeding °How should I use   this medicine? °Take this medicine by mouth with a glass of water. Swallow whole, do not crush, cut, or chew. Follow the directions on the prescription label. You can take this medicine with or without food. Take your doses at regular intervals. Do not take your medicine more often than directed. °Talk to your pediatrician regarding the use of this medicine in children. Special care may be needed. While this drug may be prescribed for children as young as 6 years for selected  conditions, precautions do apply. °Overdosage: If you think you have taken too much of this medicine contact a poison control center or emergency room at once. °NOTE: This medicine is only for you. Do not share this medicine with others. °What if I miss a dose? °If you miss a dose, take it as soon as you can. If it is almost time for your next dose, take only that dose. Do not take double or extra doses. °What may interact with this medicine? °-antihistamines for allergy, cough and cold °-atropine °-certain medicines for bladder problems like oxybutynin, tolterodine °-certain medicines for Parkinson's disease like benztropine, trihexyphenidyl °-certain medicines for stomach problems like dicyclomine, hyoscyamine °-certain medicines for travel sickness like scopolamine °-clarithromycin °-erythromycin °-ipratropium °-medicines for fungal infections, like fluconazole, itraconazole, ketoconazole or voriconazole °This list may not describe all possible interactions. Give your health care provider a list of all the medicines, herbs, non-prescription drugs, or dietary supplements you use. Also tell them if you smoke, drink alcohol, or use illegal drugs. Some items may interact with your medicine. °What should I watch for while using this medicine? °It may take a few weeks to notice the full benefit from this medicine. °You may need to limit your intake tea, coffee, caffeinated sodas, and alcohol. These drinks may make your symptoms worse. °You may get drowsy or dizzy. Do not drive, use machinery, or do anything that needs mental alertness until you know how this medicine affects you. Do not stand or sit up quickly, especially if you are an older patient. This reduces the risk of dizzy or fainting spells. Alcohol may interfere with the effect of this medicine. Avoid alcoholic drinks. °Your mouth may get dry. Chewing sugarless gum or sucking hard candy, and drinking plenty of water may help. Contact your doctor if the  problem does not go away or is severe. °This medicine may cause dry eyes and blurred vision. If you wear contact lenses, you may feel some discomfort. Lubricating drops may help. See your eyecare professional if the problem does not go away or is severe. °You may notice the shells of the tablets in your stool from time to time. This is normal. °Avoid extreme heat. This medicine can cause you to sweat less than normal. Your body temperature could increase to dangerous levels, which may lead to heat stroke. °What side effects may I notice from receiving this medicine? °Side effects that you should report to your doctor or health care professional as soon as possible: °-allergic reactions like skin rash, itching or hives, swelling of the face, lips, or tongue °-agitation °-breathing problems °-confusion °-fever °-flushing (reddening of the skin) °-hallucinations °-memory loss °-pain or difficulty passing urine °-palpitations °-unusually weak or tired °Side effects that usually do not require medical attention (report to your doctor or health care professional if they continue or are bothersome): °-constipation °-headache °-sexual difficulties (impotence) °This list may not describe all possible side effects. Call your doctor for medical advice about side effects. You may report side effects   to FDA at 1-800-FDA-1088. °Where should I keep my medicine? °Keep out of the reach of children. °Store at room temperature between 15 and 30 degrees C (59 and 86 degrees F). Protect from moisture and humidity. Throw away any unused medicine after the expiration date. °NOTE: This sheet is a summary. It may not cover all possible information. If you have questions about this medicine, talk to your doctor, pharmacist, or health care provider. °© 2019 Elsevier/Gold Standard (2013-10-13 10:57:06) ° ° °

## 2018-09-10 LAB — URINALYSIS, MICROSCOPIC ONLY: Casts: NONE SEEN /lpf

## 2018-09-10 LAB — URINE CULTURE: Organism ID, Bacteria: NO GROWTH

## 2018-09-13 ENCOUNTER — Telehealth: Payer: Self-pay

## 2018-09-13 NOTE — Telephone Encounter (Signed)
-----   Message from Nunzio Cobbs, MD sent at 09/12/2018 11:28 AM EST ----- Results to patient through My Chart.  Hello Ms. Sabra Heck,   I am sharing your test results from your recent visit.  Your urine microscopic exam and culture are normal and negative for infection.   You may start your Ditropan XL medication.   Please contact the office for any questions.   Have a good day!  [name], MD

## 2018-09-13 NOTE — Telephone Encounter (Signed)
Spoke with patient and discussed negative micro and urine culture. Advised okay to start Ditropan XL medication. Patient states her urine incontinence has slowed considerable over the weekend and is not sure she wants to purchase the Ditropan at this time. Advised patient she may have some days that are better than others but would talk with Dr.Silva for her recommendations and call her back. Routed to Dr.Silva

## 2018-09-13 NOTE — Telephone Encounter (Signed)
Patient notified of Dr.Silva's recommendations. She may wait and see if decreasing citrus helps and if not, then she'll start the Ditropan.

## 2018-09-13 NOTE — Telephone Encounter (Signed)
Patient choice to start the Ditropan or not.  She was consuming a lot of lemon water, which we reviewed as a bladder irritant.  She may do much better just avoiding the citrus!

## 2018-09-30 ENCOUNTER — Telehealth: Payer: Self-pay

## 2018-09-30 DIAGNOSIS — E119 Type 2 diabetes mellitus without complications: Secondary | ICD-10-CM

## 2018-09-30 MED ORDER — METFORMIN HCL 500 MG PO TABS
250.0000 mg | ORAL_TABLET | Freq: Every day | ORAL | 3 refills | Status: DC
Start: 2018-09-30 — End: 2019-06-13

## 2018-09-30 NOTE — Telephone Encounter (Signed)
Patient called to report that last week had an episode of her blood sugar dropping to 64. Brought it back up with juice.  Today the same thing happened but sugar was reportedly 54.   Takes Metformin 500 mg daily. Wants to know if she should reduce the amount of Metformin that she takes?  Call back is [phone]  Danley Danker, RN [facility] RN)

## 2018-09-30 NOTE — Telephone Encounter (Signed)
Unusual pattern.  A1C high off metformen - see note of 06/07/18 for some reason A1C from that date missing, we think it was 8.6.  Now low BS on metformen500 mg daily.  Will decrease to 250 mg daily.

## 2018-10-06 NOTE — Telephone Encounter (Signed)
Pt said he had a missed call from Dr. Andria Frames. Not sure if it was regarding this information below. Please call pt back.

## 2018-10-06 NOTE — Telephone Encounter (Signed)
Called and informed that I did not call.

## 2018-10-15 ENCOUNTER — Telehealth: Payer: Self-pay | Admitting: Obstetrics and Gynecology

## 2018-10-15 NOTE — Telephone Encounter (Signed)
Patient cancelled her 6 week recheck and will call back to reschedule.

## 2018-10-15 NOTE — Telephone Encounter (Signed)
Thank for for the update.  Encounter closed.

## 2018-10-20 ENCOUNTER — Ambulatory Visit: Payer: PPO | Admitting: Obstetrics and Gynecology

## 2018-11-29 ENCOUNTER — Other Ambulatory Visit: Payer: Self-pay | Admitting: Family Medicine

## 2018-12-14 DIAGNOSIS — H524 Presbyopia: Secondary | ICD-10-CM | POA: Diagnosis not present

## 2018-12-14 DIAGNOSIS — H3563 Retinal hemorrhage, bilateral: Secondary | ICD-10-CM | POA: Diagnosis not present

## 2018-12-14 DIAGNOSIS — H1045 Other chronic allergic conjunctivitis: Secondary | ICD-10-CM | POA: Diagnosis not present

## 2018-12-14 DIAGNOSIS — H3581 Retinal edema: Secondary | ICD-10-CM | POA: Diagnosis not present

## 2018-12-20 DIAGNOSIS — H353132 Nonexudative age-related macular degeneration, bilateral, intermediate dry stage: Secondary | ICD-10-CM | POA: Diagnosis not present

## 2018-12-20 DIAGNOSIS — E113491 Type 2 diabetes mellitus with severe nonproliferative diabetic retinopathy without macular edema, right eye: Secondary | ICD-10-CM | POA: Diagnosis not present

## 2018-12-20 DIAGNOSIS — E113412 Type 2 diabetes mellitus with severe nonproliferative diabetic retinopathy with macular edema, left eye: Secondary | ICD-10-CM | POA: Diagnosis not present

## 2018-12-20 DIAGNOSIS — H43813 Vitreous degeneration, bilateral: Secondary | ICD-10-CM | POA: Diagnosis not present

## 2018-12-20 LAB — HM DIABETES EYE EXAM

## 2018-12-30 DIAGNOSIS — L298 Other pruritus: Secondary | ICD-10-CM | POA: Diagnosis not present

## 2018-12-30 DIAGNOSIS — L570 Actinic keratosis: Secondary | ICD-10-CM | POA: Diagnosis not present

## 2018-12-30 DIAGNOSIS — L2084 Intrinsic (allergic) eczema: Secondary | ICD-10-CM | POA: Diagnosis not present

## 2018-12-31 ENCOUNTER — Other Ambulatory Visit: Payer: Self-pay | Admitting: Family Medicine

## 2018-12-31 DIAGNOSIS — G43909 Migraine, unspecified, not intractable, without status migrainosus: Secondary | ICD-10-CM

## 2019-01-07 ENCOUNTER — Ambulatory Visit
Admission: RE | Admit: 2019-01-07 | Discharge: 2019-01-07 | Disposition: A | Discharge status: Home / Self Care | Payer: PPO | Source: Ambulatory Visit | Attending: Family Medicine | Admitting: Family Medicine

## 2019-01-07 ENCOUNTER — Other Ambulatory Visit: Payer: Self-pay

## 2019-01-07 ENCOUNTER — Encounter: Payer: Self-pay | Admitting: Family Medicine

## 2019-01-07 ENCOUNTER — Ambulatory Visit (INDEPENDENT_AMBULATORY_CARE_PROVIDER_SITE_OTHER): Payer: PPO | Admitting: Family Medicine

## 2019-01-07 VITALS — BP 138/74 | HR 85

## 2019-01-07 DIAGNOSIS — M25511 Pain in right shoulder: Secondary | ICD-10-CM | POA: Diagnosis not present

## 2019-01-07 DIAGNOSIS — E119 Type 2 diabetes mellitus without complications: Secondary | ICD-10-CM | POA: Diagnosis not present

## 2019-01-07 DIAGNOSIS — S4991XA Unspecified injury of right shoulder and upper arm, initial encounter: Secondary | ICD-10-CM | POA: Diagnosis not present

## 2019-01-07 LAB — POCT GLYCOSYLATED HEMOGLOBIN (HGB A1C): HbA1c, POC (controlled diabetic range): 8.1 % — AB (ref 0.0–7.0)

## 2019-01-07 NOTE — Progress Notes (Signed)
    Subjective:  [name] is a 78 y.o. female who presents to the [facility] today with a chief complaint of arm pain.   HPI:  She was getting out of her bed last night which is high up and she slipped and fell down the stairs that she keeps next to her bed. She took most of the impact directly on her R shoulder. She states her R shoulder has been very stiff since and aching. Pain radiates to upper shoulder and lower side and upper back. No neck pain. No numbness or tingling. No weakness. Has been taking her tylenol and tramadol which helps a little.  States has been compliant on her metformin. She was previously on 500mg  daily but was having lows and was told to take a lower dose. She has no polyuria or polydipsia.  ROS: Per HPI  CC, SH/smoking status, and VS noted  Objective:  Physical Exam: BP 138/74   Pulse 85   SpO2 96%   Gen: NAD, resting comfortably MSK: TTP over R trapezius. Full flexion but limited to 90 degrees in abduction and 10 degrees in extension. Normal adduction. Neg hawkins. 5/5 strength in bilateral UE Skin: warm, dry Neuro: grossly normal, moves all extremities Psych: Normal affect and thought content  Results for orders placed or performed in visit on 01/07/19 (from the past 72 hour(s))  HgB A1c     Status: Abnormal   Collection Time: 01/07/19  4:20 PM  Result Value Ref Range   Hemoglobin A1C     HbA1c POC (<> result, manual entry)     HbA1c, POC (prediabetic range)     HbA1c, POC (controlled diabetic range) 8.1 (A) 0.0 - 7.0 %     Assessment/Plan:  1. Acute pain of right shoulder New problem. S/p fall with no neurovascular damage but with some limited ROM and pain. Will get xray and recommended supportive care with ice, tylenol and tramadol prn, and ROM exercises. - DG Shoulder Right; Future  2. Non-insulin dependent type 2 diabetes mellitus ([place]) Uncontrolled. A1c slightly improved to 8.1 from 8.6. Unfortunately she has not been able to tolerate a  higher dose of metformin d/t hypoglycemia. Counseled on healthy diet and f/u with PCP. - HgB A1c  Orders Placed This Encounter  Procedures  . HgB A1c    Bufford Lope, DO PGY-3, [facility] 01/07/2019 4:21 PM

## 2019-01-07 NOTE — Patient Instructions (Addendum)
Please get an xray of your R shoulder. If results require attention, either myself or my nurse will get in touch with you. If everything is normal, you will get a letter in the mail or a message in My Chart. Please give [place] a call if you do not hear from [place] after 2 weeks.  Ice the shoulder, keep up your range of motion.   Tylenol is fine for pain.

## 2019-01-08 ENCOUNTER — Other Ambulatory Visit: Payer: Self-pay | Admitting: Family Medicine

## 2019-01-08 DIAGNOSIS — M48062 Spinal stenosis, lumbar region with neurogenic claudication: Secondary | ICD-10-CM

## 2019-01-10 ENCOUNTER — Telehealth: Payer: Self-pay | Admitting: *Deleted

## 2019-01-10 NOTE — Telephone Encounter (Signed)
Pt is calling for xray results.  She is still in pain [name], CMA

## 2019-01-10 NOTE — Telephone Encounter (Signed)
Pt called again upset because no one had called her.    Advised that Dr Andria Frames and Dr Shawna Orleans were both unavailable today but would both be in office tomorrow. She seemed satisfied with answer.  States "ok, I'll wait to hear from them tomorrow". Christen Bame, CMA

## 2019-01-11 NOTE — Telephone Encounter (Signed)
Please inform patient that no acute findings on xray which is good. It does show that she has arthritis in the shoulder. Results were released via mychart if she would like to view them herself. She should take medicines and ice the area as we talked about during our office visit.

## 2019-01-11 NOTE — Telephone Encounter (Signed)
Results given to patient and she voiced understanding.  Jazmin Hartsell,CMA

## 2019-01-12 ENCOUNTER — Other Ambulatory Visit: Payer: Self-pay | Admitting: Family Medicine

## 2019-01-18 ENCOUNTER — Encounter: Payer: Self-pay | Admitting: Family Medicine

## 2019-01-20 ENCOUNTER — Ambulatory Visit (INDEPENDENT_AMBULATORY_CARE_PROVIDER_SITE_OTHER): Payer: PPO | Admitting: Family Medicine

## 2019-01-20 ENCOUNTER — Encounter: Payer: Self-pay | Admitting: Family Medicine

## 2019-01-20 ENCOUNTER — Other Ambulatory Visit: Payer: Self-pay

## 2019-01-20 DIAGNOSIS — G4730 Sleep apnea, unspecified: Secondary | ICD-10-CM | POA: Diagnosis not present

## 2019-01-20 DIAGNOSIS — F4323 Adjustment disorder with mixed anxiety and depressed mood: Secondary | ICD-10-CM

## 2019-01-20 DIAGNOSIS — M75101 Unspecified rotator cuff tear or rupture of right shoulder, not specified as traumatic: Secondary | ICD-10-CM

## 2019-01-20 DIAGNOSIS — I10 Essential (primary) hypertension: Secondary | ICD-10-CM

## 2019-01-20 DIAGNOSIS — R5383 Other fatigue: Secondary | ICD-10-CM

## 2019-01-20 DIAGNOSIS — R296 Repeated falls: Secondary | ICD-10-CM | POA: Diagnosis not present

## 2019-01-20 DIAGNOSIS — R32 Unspecified urinary incontinence: Secondary | ICD-10-CM

## 2019-01-20 NOTE — Patient Instructions (Addendum)
Let's watch the dizziness for now.  Hopefully, it goes away on its own.  Please pay attention to see if it comes on with sudden head or neck movements or with getting up too quickly.  Try going off the ditropan for a week.  See if the lightheadedness improves.  Also see if you get up more to pee at night.  Call me in one week to let me know.  I will call tomorrow with lab test results. If they are normal, I will ask: Do you want a sleep study? Are you symptoms bad enough that you would be willing try another medication.

## 2019-01-21 ENCOUNTER — Encounter: Payer: Self-pay | Admitting: Family Medicine

## 2019-01-21 DIAGNOSIS — M75101 Unspecified rotator cuff tear or rupture of right shoulder, not specified as traumatic: Secondary | ICD-10-CM | POA: Insufficient documentation

## 2019-01-21 LAB — CMP14+EGFR
ALT: 21 IU/L (ref 0–32)
AST: 35 IU/L (ref 0–40)
Albumin/Globulin Ratio: 2.3 — ABNORMAL HIGH (ref 1.2–2.2)
Albumin: 4.8 g/dL — ABNORMAL HIGH (ref 3.7–4.7)
Alkaline Phosphatase: 91 IU/L (ref 39–117)
BUN/Creatinine Ratio: 20 (ref 12–28)
BUN: 19 mg/dL (ref 8–27)
Bilirubin Total: 0.4 mg/dL (ref 0.0–1.2)
CO2: 23 mmol/L (ref 20–29)
Calcium: 9.6 mg/dL (ref 8.7–10.3)
Chloride: 104 mmol/L (ref 96–106)
Creatinine, Ser: 0.97 mg/dL (ref 0.57–1.00)
GFR calc Af Amer: 66 mL/min/{1.73_m2} (ref >59)
GFR calc non Af Amer: 57 mL/min/{1.73_m2} — ABNORMAL LOW (ref >59)
Globulin, Total: 2.1 g/dL (ref 1.5–4.5)
Glucose: 118 mg/dL — ABNORMAL HIGH (ref 65–99)
Potassium: 4.4 mmol/L (ref 3.5–5.2)
Sodium: 141 mmol/L (ref 134–144)
Total Protein: 6.9 g/dL (ref 6.0–8.5)

## 2019-01-21 LAB — CBC WITH DIFFERENTIAL/PLATELET
Basophils Absolute: 0.1 10*3/uL (ref 0.0–0.2)
Basos: 1 %
EOS (ABSOLUTE): 0.1 10*3/uL (ref 0.0–0.4)
Eos: 2 %
Hematocrit: 35.9 % (ref 34.0–46.6)
Hemoglobin: 11.9 g/dL (ref 11.1–15.9)
Immature Grans (Abs): 0 10*3/uL (ref 0.0–0.1)
Immature Granulocytes: 0 %
Lymphocytes Absolute: 1.6 10*3/uL (ref 0.7–3.1)
Lymphs: 30 %
MCH: 28.1 pg (ref 26.6–33.0)
MCHC: 33.1 g/dL (ref 31.5–35.7)
MCV: 85 fL (ref 79–97)
Monocytes Absolute: 0.4 10*3/uL (ref 0.1–0.9)
Monocytes: 8 %
Neutrophils Absolute: 3.3 10*3/uL (ref 1.4–7.0)
Neutrophils: 59 %
Platelets: 159 10*3/uL (ref 150–450)
RBC: 4.23 x10E6/uL (ref 3.77–5.28)
RDW: 14.4 % (ref 11.7–15.4)
WBC: 5.5 10*3/uL (ref 3.4–10.8)

## 2019-01-21 LAB — TSH: TSH: 3.25 u[IU]/mL (ref 0.450–4.500)

## 2019-01-21 MED ORDER — DULOXETINE HCL 60 MG PO CPEP: 60.0000 mg | ORAL_CAPSULE | Freq: Every day | ORAL | 3 refills | Status: DC

## 2019-01-21 NOTE — Assessment & Plan Note (Signed)
Does not see benefit from oxybutinyn.  Will DC because it may have contributed to the fall

## 2019-01-21 NOTE — Assessment & Plan Note (Signed)
Fall.  Likely mechanical fall.

## 2019-01-21 NOTE — Assessment & Plan Note (Signed)
Worried about developing frozen shoulder.  Good immediate response to injection.  Emphasized ROM exercises.

## 2019-01-21 NOTE — Assessment & Plan Note (Signed)
Likely due to stress/depression.  Check labs for metabolic problem.  Labs are reassuring.  Will treat as depression.

## 2019-01-21 NOTE — Assessment & Plan Note (Signed)
Doubt sleep apnea as cause.  Patient not snoring and husband does not report any stopage of breathing.

## 2019-01-21 NOTE — Progress Notes (Signed)
Established Patient Office Visit  Subjective:  Patient ID: [name], female    DOB: [birth year]  Age: 78 y.o. MRN: [id number]  CC:  Chief Complaint  Patient presents with  . Shoulder Pain    HPI [name] presents for multiple issues 1. Fall getting out of bed.  Unclear if she lost her balance or got lightheaded.  Her history suggests a mechanical fall.  No subsequent falls, syncope or near syncope. Fall was 2 weeks ago 2.  Injured Right shoulder with fall, Seen in [facility].  Xrays negative for acute fx.  Did show degenerative changes.  Pain is not improving.  Has much less ROM of shoulder now. 3. Tired all the time.  She feels likely related to stress of her husband's lymphoma and them being thrust together 24/7 with COVID.  No focal sx.  Denies CP, SOB, Abd pain, focal weakness, bleeding.  Denies change in bowel, bladder, appetite, or wt.  Sleeps poorly at night.  Does not snore.  Does have remote Hx of possible sleep apnea. 4. DM  Control was sub optimal at las A1C check 5/29.  Has not been very active.  Since 5/29 visit she has focused on diet.  Knows she needs to get more active.  Past Medical History:  Diagnosis Date  . Asthma   . Depression   . Diabetes mellitus without complication ([place])   . Hypertension   . Migraine    without aura  . Urinary incontinence     Past Surgical History:  Procedure Laterality Date  . ABDOMINAL HYSTERECTOMY     ?Lt. ovary remains  . APPENDECTOMY    . Arm surgery    . BREAST BIOPSY Right 10/10/2009  . BREAST CYST ASPIRATION  12/20/2013  . BREAST EXCISIONAL BIOPSY Left 1998  . BREAST EXCISIONAL BIOPSY Right 1993    Family History  Problem Relation Age of Onset  . Hypertension Mother   . Hypertension Father   . Hearing loss Sister   . Heart attack Sister 86       s/p 4 bypasses   . Stroke Brother        23  . Breast cancer Maternal Aunt   . Ovarian cancer Maternal Aunt     Social History   Socioeconomic History  . Marital  status: Married    Spouse name: Not on file  . Number of children: Not on file  . Years of education: Not on file  . Highest education level: Not on file  Occupational History  . Not on file  Social Needs  . Financial resource strain: Not on file  . Food insecurity    Worry: Not on file    Inability: Not on file  . Transportation needs    Medical: Not on file    Non-medical: Not on file  Tobacco Use  . Smoking status: Former Smoker    Packs/day: 2.00    Years: 33.00    Pack years: 66.00    Types: Cigarettes    Start date: 08/11/1957    Quit date: 04/04/1991    Years since quitting: 27.8  . Smokeless tobacco: Never Used  Substance and Sexual Activity  . Alcohol use: No    Frequency: Never  . Drug use: No  . Sexual activity: Not Currently  Lifestyle  . Physical activity    Days per week: Not on file    Minutes per session: Not on file  . Stress: Not on file  Relationships  .  Social Herbalist on phone: Not on file    Gets together: Not on file    Attends religious service: Not on file    Active member of club or organization: Not on file    Attends meetings of clubs or organizations: Not on file    Relationship status: Not on file  . Intimate partner violence    Fear of current or ex partner: Not on file    Emotionally abused: Not on file    Physically abused: Not on file    Forced sexual activity: Not on file  Other Topics Concern  . Not on file  Social History Narrative  . Not on file    Outpatient Medications Prior to Visit  Medication Sig Dispense Refill  . benzonatate (TESSALON) 200 MG capsule Take 1 capsule by mouth three times daily as needed for cough 20 capsule 0  . DULoxetine (CYMBALTA) 30 MG capsule TAKE ONE CAPSULE BY MOUTH EVERY DAY 90 capsule 3  . enalapril (VASOTEC) 10 MG tablet Take 1 tablet by mouth once daily 90 tablet 3  . famotidine (PEPCID) 40 MG tablet Take 40 mg by mouth every evening.     . Lancets (ONETOUCH DELICA PLUS  [id number]) MISC TEST BLOOD SUGAR DAILY 100 each 0  . loratadine (CLARITIN) 10 MG tablet Take 10 mg by mouth daily.    . metFORMIN (GLUCOPHAGE) 500 MG tablet Take 0.5 tablets (250 mg total) by mouth daily with breakfast. 180 tablet 3  . Multiple Vitamin (MULTIVITAMIN WITH MINERALS) TABS tablet Take 1 tablet by mouth daily.    . nortriptyline (PAMELOR) 50 MG capsule TAKE ONE CAPSULE BY MOUTH TWICE DAILY 180 capsule 3  . ondansetron (ZOFRAN) 4 MG tablet TAKE 1 TABLET BY MOUTH EVERY 8 HOURS AS NEEDED FOR  NAUSEA  OR  VOMITING 20 tablet 3  . ONETOUCH VERIO test strip USE  STRIP TO CHECK GLUCOSE ONCE DAILY 100 each 3  . simvastatin (ZOCOR) 20 MG tablet TAKE ONE-HALF TABLET BY MOUTH ONCE DAILY 45 tablet 3  . topiramate (TOPAMAX) 50 MG tablet TAKE 3 TABLETS BY MOUTH AT BEDTIME 270 tablet 3  . traMADol (ULTRAM) 50 MG tablet TAKE 1 TABLET BY MOUTH EVERY 8 HOURS AS NEEDED 100 tablet 1  . vitamin C (ASCORBIC ACID) 250 MG tablet Take 500 mg by mouth daily.     Marland Kitchen conjugated estrogens (PREMARIN) vaginal cream Place 1 Applicatorful vaginally daily. 42.5 g 12  . oxybutynin (DITROPAN XL) 5 MG 24 hr tablet Take 1 tablet (5 mg total) by mouth at bedtime. 30 tablet 1   No facility-administered medications prior to visit.     Allergies  Allergen Reactions  . Amoxicillin Rash  . Azithromycin Rash    Itching/rash  . Butalbital Other (See Comments)    Reaction: trouble breathing, but uncertain  . Clindamycin Hcl Rash  . Hydrocodone-Acetaminophen Rash    REACTION: developed rash  . Januvia [Sitagliptin] Other (See Comments)    Worsened migraines.  [place] [Levofloxacin In D5w] Diarrhea  . Meperidine Hcl Other (See Comments)    REACTION: itching and hallucinations  . Sulfamethoxazole Rash  . Ketorolac Swelling    Received ketorolac, methocarbamal, and lidocaine patch all at the same time.  Lip swelling 12 hours later  . Lidocaine Swelling    Received ketorolac, methocarbamal, and lidocaine patch all at  the same time.  Lip swelling 12 hours later  . Macrodantin [Nitrofurantoin Macrocrystal] Itching  . Methocarbamol Swelling  Received ketorolac, methocarbamal, and lidocaine patch all at the same time.  Lip swelling 12 hours later  . Codeine Phosphate Other (See Comments)    REACTION: unknown  . Etodolac Other (See Comments)    REACTION: stomach upset  . Gabapentin Other (See Comments)    REACTION: mental clouding  Does not want to try again even in low dose.  . Naproxen Other (See Comments)    REACTION: stomach upset  . Oxycodone Other (See Comments)    Reaction: Stomach upset  . Pentazocine-Naloxone Other (See Comments)    REACTION: unspecified  . Sumatriptan Other (See Comments)    REACTION: chest pain  . Zonegran Other (See Comments)    Reaction: Unknown    ROS Review of Systems    Objective:    Physical Exam  BP 126/70   Pulse (!) 101   SpO2 97%  Wt Readings from Last 3 Encounters:  09/09/18 186 lb (84.4 kg)  08/27/18 186 lb 9.6 oz (84.6 kg)  08/16/18 186 lb 9.6 oz (84.6 kg)  Very limited ROM of Right shoulder with pain on compression and movement. Neck, no thyromegally Lungs clear Cardiac RRR without m or g Abd benign Ext trace bilateral edema  Informed consent obtained.  Rt shoulder injection done with 3 cc xylocaine and 80 mg dep medrol.  Patient tolerated procedure well.  Excellent immediate relief with great improvement in ROM.  Left clinic in good condition.   Health Maintenance Due  Topic Date Due  . FOOT EXAM  06/17/2018    There are no preventive care reminders to display for this patient.  Lab Results  Component Value Date   TSH 3.250 01/20/2019   Lab Results  Component Value Date   WBC 5.5 01/20/2019   HGB 11.9 01/20/2019   HCT 35.9 01/20/2019   MCV 85 01/20/2019   PLT 159 01/20/2019   Lab Results  Component Value Date   NA 141 01/20/2019   K 4.4 01/20/2019   CO2 23 01/20/2019   GLUCOSE 118 (H) 01/20/2019   BUN 19 01/20/2019    CREATININE 0.97 01/20/2019   BILITOT 0.4 01/20/2019   ALKPHOS 91 01/20/2019   AST 35 01/20/2019   ALT 21 01/20/2019   PROT 6.9 01/20/2019   ALBUMIN 4.8 (H) 01/20/2019   CALCIUM 9.6 01/20/2019   ANIONGAP 8 07/30/2015   Lab Results  Component Value Date   CHOL 170 11/06/2016   Lab Results  Component Value Date   HDL 56 11/06/2016   Lab Results  Component Value Date   LDLCALC 85 11/06/2016   Lab Results  Component Value Date   TRIG 146 11/06/2016   Lab Results  Component Value Date   CHOLHDL 3.0 11/06/2016   Lab Results  Component Value Date   HGBA1C 8.1 (A) 01/07/2019      Assessment & Plan:   Problem List Items Addressed This Visit    Urinary incontinence    Does not see benefit from oxybutinyn.  Will DC because it may have contributed to the fall      HYPERTENSION, BENIGN SYSTEMIC   Relevant Orders   CMP14+EGFR (Completed)   TSH (Completed)   CBC with Differential (Completed)      No orders of the defined types were placed in this encounter.   Follow-up: No follow-ups on file.    Zenia Resides, MD

## 2019-01-21 NOTE — Assessment & Plan Note (Signed)
I am not concerned about over treatment.  BP seems to be right where we want it.

## 2019-01-27 ENCOUNTER — Ambulatory Visit: Payer: PPO

## 2019-01-27 ENCOUNTER — Other Ambulatory Visit: Payer: Self-pay

## 2019-01-28 ENCOUNTER — Other Ambulatory Visit: Payer: Self-pay | Admitting: Family Medicine

## 2019-03-07 ENCOUNTER — Other Ambulatory Visit: Payer: Self-pay

## 2019-03-07 ENCOUNTER — Encounter: Payer: Self-pay | Admitting: Family Medicine

## 2019-03-07 ENCOUNTER — Telehealth (INDEPENDENT_AMBULATORY_CARE_PROVIDER_SITE_OTHER): Payer: PPO | Admitting: Family Medicine

## 2019-03-07 DIAGNOSIS — E119 Type 2 diabetes mellitus without complications: Secondary | ICD-10-CM

## 2019-03-07 NOTE — Progress Notes (Signed)
Agrees to video visit.  Attempted video reverted to regular phone call.  Several low blood sugars with blood sugars in the 50s.     Feels shakey, weak and tired.   Nurse visit July 13 and Hgb A1C was 6.8.   Has been taking metformin 500 mg.   On her own, days that she has a low blood sugar, she either skips the or takes a half dose of the metformin.   Given her age, it would be resonable to have a goal A1C of below 8 She has lost wt.  Now down to 180  We cut her metformin back to 250 mg per day.  Duration of visit was 18 minutes.

## 2019-03-07 NOTE — Assessment & Plan Note (Signed)
Decrease metformin dose to 250 mg daily.  Goal is to get off altogether.  Congratulated on her weight loss

## 2019-03-30 ENCOUNTER — Other Ambulatory Visit: Payer: Self-pay | Admitting: Family Medicine

## 2019-03-30 DIAGNOSIS — G8929 Other chronic pain: Secondary | ICD-10-CM

## 2019-03-30 DIAGNOSIS — M546 Pain in thoracic spine: Secondary | ICD-10-CM

## 2019-04-05 ENCOUNTER — Other Ambulatory Visit: Payer: Self-pay | Admitting: Family Medicine

## 2019-04-05 DIAGNOSIS — G43909 Migraine, unspecified, not intractable, without status migrainosus: Secondary | ICD-10-CM

## 2019-04-15 ENCOUNTER — Telehealth: Payer: Self-pay

## 2019-04-15 NOTE — Telephone Encounter (Signed)
Patient calls nurse line stating she has been breaking out in a sweat every now and then since yesterday. Patient has not changed anything medication wise and her sugars have been running in the 120s for the past three weeks. Patient doesn't know what could be causing think. I advised her I would pass along to PCP and we will go from there. Patient happy with plan.

## 2019-04-16 NOTE — Telephone Encounter (Signed)
Sweating without other symptoms.  Denies fever, chills, feeling ill, cough, etc.  Only present x 2 days.  Will observe.  She will check BS while sweating.  Observe for now.  I will see if worsens, develops other sx or has documented low BS.

## 2019-05-30 ENCOUNTER — Other Ambulatory Visit: Payer: Self-pay | Admitting: Family Medicine

## 2019-05-30 DIAGNOSIS — G43909 Migraine, unspecified, not intractable, without status migrainosus: Secondary | ICD-10-CM

## 2019-06-13 ENCOUNTER — Encounter: Payer: Self-pay | Admitting: Family Medicine

## 2019-06-13 ENCOUNTER — Ambulatory Visit
Admission: RE | Admit: 2019-06-13 | Discharge: 2019-06-13 | Disposition: A | Discharge status: Home / Self Care | Payer: PPO | Source: Ambulatory Visit | Attending: Family Medicine | Admitting: Family Medicine

## 2019-06-13 ENCOUNTER — Ambulatory Visit (INDEPENDENT_AMBULATORY_CARE_PROVIDER_SITE_OTHER): Payer: PPO | Admitting: Family Medicine

## 2019-06-13 ENCOUNTER — Other Ambulatory Visit: Payer: Self-pay

## 2019-06-13 VITALS — BP 120/84 | HR 97 | Wt 193.0 lb

## 2019-06-13 DIAGNOSIS — E119 Type 2 diabetes mellitus without complications: Secondary | ICD-10-CM | POA: Diagnosis not present

## 2019-06-13 DIAGNOSIS — F4323 Adjustment disorder with mixed anxiety and depressed mood: Secondary | ICD-10-CM | POA: Diagnosis not present

## 2019-06-13 DIAGNOSIS — M25562 Pain in left knee: Secondary | ICD-10-CM

## 2019-06-13 DIAGNOSIS — M179 Osteoarthritis of knee, unspecified: Secondary | ICD-10-CM | POA: Insufficient documentation

## 2019-06-13 DIAGNOSIS — M1712 Unilateral primary osteoarthritis, left knee: Secondary | ICD-10-CM | POA: Diagnosis not present

## 2019-06-13 DIAGNOSIS — G8929 Other chronic pain: Secondary | ICD-10-CM

## 2019-06-13 LAB — POCT GLYCOSYLATED HEMOGLOBIN (HGB A1C): HbA1c, POC (controlled diabetic range): 7.7 % — AB (ref 0.0–7.0)

## 2019-06-13 MED ORDER — BUSPIRONE HCL 5 MG PO TABS: 5.0000 mg | ORAL_TABLET | Freq: Three times a day (TID) | ORAL | 2 refills | Status: DC

## 2019-06-13 MED ORDER — CANAGLIFLOZIN 300 MG PO TABS: 300.0000 mg | ORAL_TABLET | Freq: Every day | ORAL | 3 refills | Status: DC

## 2019-06-13 NOTE — Patient Instructions (Addendum)
I will call with x ray results.  I will ask if it is worth a cortisone shot.   Also when I call, let me know if I need to revise your medicine or allergy list. I sent in a prescription for an anxiety medication best taken regularly.  Let me know if it helps. I also sent a new diabetes medication.  If it is affordable, stop the metformin and just take this.  If you cannot afford, stay on the metformin.   See me in 2-12 weeks depending on if you want a cortisone shot in the knee and how well the other problems are going.

## 2019-06-13 NOTE — Progress Notes (Signed)
21 

## 2019-06-15 ENCOUNTER — Encounter: Payer: Self-pay | Admitting: Family Medicine

## 2019-06-15 NOTE — Assessment & Plan Note (Signed)
Trial of DC metformin (does it improve abd pain) and switch to [email or web address]  She will check on cost when picking up script.

## 2019-06-15 NOTE — Progress Notes (Signed)
Acute Office Visit  Subjective:    Patient ID: [name], female    DOB: [birth year], 78 y.o.   MRN: [id number]  Chief Complaint  Patient presents with  . Knee Pain    HPI Patient is in today for left knee pain and more. 1. Patient fell 5/20.  Strikeing her shoulder.  Does not recall striking left knee.  Ever since, she has had worsening trouble with pain and stiffness, especially when first arising.  Minor pains in other joints.  Known osteoarthritis in multiple joints.  No redness or fever.  No prior imaging. 2. Anxiety.  She and husband are squabbling a lot.  With his retirement, his illness, and our COVID isolation, they are thrust together 24/7.  States counseling not an option. 3. Stomach cramps.  Wonders about metformin.  Has had GI intollerance at higher doses. 4. DM  A1C improved at 7.7.  With her age this is OK.  Has insurance that covers meds - often with a significant co-pay.  Intollerant of januvia in the past.   Past Medical History:  Diagnosis Date  . Asthma   . Depression   . Diabetes mellitus without complication ([place])   . Hypertension   . Migraine    without aura  . Urinary incontinence     Past Surgical History:  Procedure Laterality Date  . ABDOMINAL HYSTERECTOMY     ?Lt. ovary remains  . APPENDECTOMY    . Arm surgery    . BREAST BIOPSY Right 10/10/2009  . BREAST CYST ASPIRATION  12/20/2013  . BREAST EXCISIONAL BIOPSY Left 1998  . BREAST EXCISIONAL BIOPSY Right 1993    Family History  Problem Relation Age of Onset  . Hypertension Mother   . Hypertension Father   . Hearing loss Sister   . Heart attack Sister 63       s/p 4 bypasses   . Stroke Brother        29  . Breast cancer Maternal Aunt   . Ovarian cancer Maternal Aunt     Social History   Socioeconomic History  . Marital status: Married    Spouse name: Not on file  . Number of children: Not on file  . Years of education: Not on file  . Highest education level: Not on file   Occupational History  . Not on file  Social Needs  . Financial resource strain: Not on file  . Food insecurity    Worry: Not on file    Inability: Not on file  . Transportation needs    Medical: Not on file    Non-medical: Not on file  Tobacco Use  . Smoking status: Former Smoker    Packs/day: 2.00    Years: 33.00    Pack years: 66.00    Types: Cigarettes    Start date: 08/11/1957    Quit date: 04/04/1991    Years since quitting: 28.2  . Smokeless tobacco: Never Used  Substance and Sexual Activity  . Alcohol use: No    Frequency: Never  . Drug use: No  . Sexual activity: Not Currently  Lifestyle  . Physical activity    Days per week: Not on file    Minutes per session: Not on file  . Stress: Not on file  Relationships  . Social Herbalist on phone: Not on file    Gets together: Not on file    Attends religious service: Not on file    Active member  of club or organization: Not on file    Attends meetings of clubs or organizations: Not on file    Relationship status: Not on file  . Intimate partner violence    Fear of current or ex partner: Not on file    Emotionally abused: Not on file    Physically abused: Not on file    Forced sexual activity: Not on file  Other Topics Concern  . Not on file  Social History Narrative  . Not on file    Outpatient Medications Prior to Visit  Medication Sig Dispense Refill  . benzonatate (TESSALON) 200 MG capsule Take 1 capsule by mouth three times daily as needed for cough 20 capsule 0  . DULoxetine (CYMBALTA) 60 MG capsule Take 1 capsule (60 mg total) by mouth daily. 90 capsule 3  . enalapril (VASOTEC) 10 MG tablet Take 1 tablet by mouth once daily 90 tablet 3  . famotidine (PEPCID) 40 MG tablet Take 40 mg by mouth every evening.     . Lancets (ONETOUCH DELICA PLUS 123XX123) MISC TEST BLOOD SUGAR DAILY 100 each 0  . loratadine (CLARITIN) 10 MG tablet Take 10 mg by mouth daily.    . Multiple Vitamin (MULTIVITAMIN WITH  MINERALS) TABS tablet Take 1 tablet by mouth daily.    . nortriptyline (PAMELOR) 50 MG capsule Take 1 capsule by mouth twice daily 180 capsule 3  . ondansetron (ZOFRAN) 4 MG tablet TAKE 1 TABLET BY MOUTH EVERY 8 HOURS AS NEEDED FOR  NAUSEA  OR  VOMITING 20 tablet 3  . ONETOUCH VERIO test strip TEST ONCE DAILY 100 strip 1  . simvastatin (ZOCOR) 20 MG tablet TAKE ONE-HALF TABLET BY MOUTH ONCE DAILY 45 tablet 3  . topiramate (TOPAMAX) 50 MG tablet TAKE 3 TABLETS BY MOUTH ONCE DAILY AT BEDTIME 270 tablet 3  . traMADol (ULTRAM) 50 MG tablet TAKE 1 TABLET BY MOUTH EVERY 8 HOURS AS NEEDED 90 tablet 5  . vitamin C (ASCORBIC ACID) 250 MG tablet Take 500 mg by mouth daily.     . metFORMIN (GLUCOPHAGE) 500 MG tablet Take 0.5 tablets (250 mg total) by mouth daily with breakfast. 180 tablet 3   No facility-administered medications prior to visit.     Allergies  Allergen Reactions  . Amoxicillin Rash  . Azithromycin Rash    Itching/rash  . Butalbital Other (See Comments)    Reaction: trouble breathing, but uncertain  . Clindamycin Hcl Rash  . Hydrocodone-Acetaminophen Rash    REACTION: developed rash  . Januvia [Sitagliptin] Other (See Comments)    Worsened migraines.  [place] [Levofloxacin In D5w] Diarrhea  . Meperidine Hcl Other (See Comments)    REACTION: itching and hallucinations  . Sulfamethoxazole Rash  . Ketorolac Swelling    Received ketorolac, methocarbamal, and lidocaine patch all at the same time.  Lip swelling 12 hours later  . Lidocaine Swelling    Received ketorolac, methocarbamal, and lidocaine patch all at the same time.  Lip swelling 12 hours later  . Macrodantin [Nitrofurantoin Macrocrystal] Itching  . Methocarbamol Swelling    Received ketorolac, methocarbamal, and lidocaine patch all at the same time.  Lip swelling 12 hours later  . Codeine Phosphate Other (See Comments)    REACTION: unknown  . Etodolac Other (See Comments)    REACTION: stomach upset  . Gabapentin  Other (See Comments)    REACTION: mental clouding  Does not want to try again even in low dose.  . Naproxen Other (See Comments)  REACTION: stomach upset  . Pentazocine-Naloxone Other (See Comments)    REACTION: unspecified  . Sumatriptan Other (See Comments)    REACTION: chest pain  . Zonegran Other (See Comments)    Reaction: Unknown    ROS     Objective:    Physical Exam  BP 120/84   Pulse 97   Wt 193 lb (87.5 kg)   SpO2 93%   BMI 39.31 kg/m  Wt Readings from Last 3 Encounters:  06/13/19 193 lb (87.5 kg)  03/07/19 180 lb (81.6 kg)  09/09/18 186 lb (84.4 kg)   Lungs clear Cardiac RRR without m or g Abd benign Ext Left knee stable to provocative testing.  No obvious effusion.  Pain at the joint line.  Health Maintenance Due  Topic Date Due  . INFLUENZA VACCINE  03/12/2019    There are no preventive care reminders to display for this patient.   Lab Results  Component Value Date   TSH 3.250 01/20/2019   Lab Results  Component Value Date   WBC 5.5 01/20/2019   HGB 11.9 01/20/2019   HCT 35.9 01/20/2019   MCV 85 01/20/2019   PLT 159 01/20/2019   Lab Results  Component Value Date   NA 141 01/20/2019   K 4.4 01/20/2019   CO2 23 01/20/2019   GLUCOSE 118 (H) 01/20/2019   BUN 19 01/20/2019   CREATININE 0.97 01/20/2019   BILITOT 0.4 01/20/2019   ALKPHOS 91 01/20/2019   AST 35 01/20/2019   ALT 21 01/20/2019   PROT 6.9 01/20/2019   ALBUMIN 4.8 (H) 01/20/2019   CALCIUM 9.6 01/20/2019   ANIONGAP 8 07/30/2015   Lab Results  Component Value Date   CHOL 170 11/06/2016   Lab Results  Component Value Date   HDL 56 11/06/2016   Lab Results  Component Value Date   LDLCALC 85 11/06/2016   Lab Results  Component Value Date   TRIG 146 11/06/2016   Lab Results  Component Value Date   CHOLHDL 3.0 11/06/2016   Lab Results  Component Value Date   HGBA1C 7.7 (A) 06/13/2019       Assessment & Plan:   Problem List Items Addressed This Visit     Adjustment reaction with anxiety and depression   Relevant Medications   busPIRone (BUSPAR) 5 MG tablet   Left knee pain   Relevant Orders   DG Knee Complete 4 Views Left (Completed)   Non-insulin dependent type 2 diabetes mellitus ([place]) - Primary   Relevant Medications   canagliflozin (INVOKANA) 300 MG TABS tablet   Other Relevant Orders   POCT glycosylated hemoglobin (Hb A1C) (Completed)       Meds ordered this encounter  Medications  . busPIRone (BUSPAR) 5 MG tablet    Sig: Take 1 tablet (5 mg total) by mouth 3 (three) times daily.    Dispense:  90 tablet    Refill:  2  . canagliflozin (INVOKANA) 300 MG TABS tablet    Sig: Take 1 tablet (300 mg total) by mouth daily before breakfast. For diabetes    Dispense:  30 tablet    Refill:  3     Zenia Resides, MD

## 2019-06-15 NOTE — Assessment & Plan Note (Signed)
Confirm clinical suspicion of chronic osteoarthritis with Xray.  Started the discussion of therapeutic options.  Given results of x ray per phone.  She may make an appointment for a cortisone injection.

## 2019-06-15 NOTE — Assessment & Plan Note (Signed)
Discussed options.  We landed on adding buspar.  Trial started.

## 2019-06-15 NOTE — Assessment & Plan Note (Signed)
>>  ASSESSMENT AND PLAN FOR ADJUSTMENT REACTION WITH ANXIETY AND DEPRESSION WRITTEN ON 06/15/2019 12:22 PM BY HENSEL, ELSIE LABOR, MD  Discussed options.  We landed on adding buspar .  Trial started.

## 2019-06-16 ENCOUNTER — Ambulatory Visit (INDEPENDENT_AMBULATORY_CARE_PROVIDER_SITE_OTHER): Payer: PPO | Admitting: Family Medicine

## 2019-06-16 ENCOUNTER — Encounter: Payer: Self-pay | Admitting: Family Medicine

## 2019-06-16 ENCOUNTER — Other Ambulatory Visit: Payer: Self-pay

## 2019-06-16 VITALS — BP 124/72 | HR 103 | Wt 191.4 lb

## 2019-06-16 DIAGNOSIS — M25562 Pain in left knee: Secondary | ICD-10-CM | POA: Diagnosis not present

## 2019-06-16 DIAGNOSIS — G8929 Other chronic pain: Secondary | ICD-10-CM | POA: Diagnosis not present

## 2019-06-16 DIAGNOSIS — M1712 Unilateral primary osteoarthritis, left knee: Secondary | ICD-10-CM

## 2019-06-16 MED ORDER — METHYLPREDNISOLONE ACETATE 80 MG/ML IJ SUSP
80.0000 mg | Freq: Once | INTRAMUSCULAR | Status: AC
  Administered 2019-06-16: 80 mg via INTRAMUSCULAR

## 2019-06-16 NOTE — Progress Notes (Signed)
  Subjective:     Patient ID: [name], female   DOB: 1940-12-26, 78 y.o.   MRN: [id number]  HPI FU left knee pain and adjustment reaction. Left knee pain.  She knows xrays showed expected osteoarthritis.  After discussion, we agreed to try a knee injection.  Also educated about knee replacement surgery. Adjustment reaction.  Could not take buspar, made urinary incontinence worse.    Review of Systems     Objective:   Physical Exam  Informed consent obtained. Left knee injection done with 80 mg of depo medrol and 4 cc 2% xylocaine.  Betadine prep.  Patient tolerated procedure well     Assessment:     See problem    Plan:     See problem

## 2019-06-16 NOTE — Patient Instructions (Signed)
I hope you get a good response from the knee injection.   After we know how you will respond, we can either do more injections (maybe every 3 months) or refer you to an orthopedist to discuss knee replacement surgery.

## 2019-06-16 NOTE — Assessment & Plan Note (Signed)
Trial steroid injection.  Consider ortho referral to further discuss knee replacement

## 2019-06-17 ENCOUNTER — Telehealth: Payer: Self-pay

## 2019-06-17 NOTE — Telephone Encounter (Signed)
I have asked her to try to ride out these side effects.  Drink more water.  Reminded her of the cardiac and renal protection of this medication.  I believe she will grow accustomed to it if she gives it time.

## 2019-06-17 NOTE — Telephone Encounter (Signed)
Patients calls nurse line stating she is having undesired side effects to recently prescribed Invokana. Patient stated she took first dose on 11/2 and every morning after. Patient stated she has developed "terrible dry mouth," which results in her having to go to the bathroom more frequently. Patient also noticed an itchy rash under her chin. Please advise.

## 2019-06-20 DIAGNOSIS — E113491 Type 2 diabetes mellitus with severe nonproliferative diabetic retinopathy without macular edema, right eye: Secondary | ICD-10-CM | POA: Diagnosis not present

## 2019-06-20 DIAGNOSIS — H353132 Nonexudative age-related macular degeneration, bilateral, intermediate dry stage: Secondary | ICD-10-CM | POA: Diagnosis not present

## 2019-06-20 DIAGNOSIS — H35371 Puckering of macula, right eye: Secondary | ICD-10-CM | POA: Diagnosis not present

## 2019-06-20 DIAGNOSIS — E113412 Type 2 diabetes mellitus with severe nonproliferative diabetic retinopathy with macular edema, left eye: Secondary | ICD-10-CM | POA: Diagnosis not present

## 2019-06-29 ENCOUNTER — Other Ambulatory Visit: Payer: Self-pay | Admitting: Family Medicine

## 2019-06-29 DIAGNOSIS — R112 Nausea with vomiting, unspecified: Secondary | ICD-10-CM

## 2019-07-29 ENCOUNTER — Other Ambulatory Visit: Payer: Self-pay | Admitting: Family Medicine

## 2019-08-01 ENCOUNTER — Other Ambulatory Visit: Payer: Self-pay

## 2019-08-01 ENCOUNTER — Other Ambulatory Visit: Payer: Self-pay | Admitting: Family Medicine

## 2019-08-02 ENCOUNTER — Other Ambulatory Visit: Payer: Self-pay | Admitting: Family Medicine

## 2019-08-02 MED ORDER — BENZONATATE 200 MG PO CAPS: ORAL_CAPSULE | ORAL | 0 refills | Status: DC

## 2019-08-06 DIAGNOSIS — Z20828 Contact with and (suspected) exposure to other viral communicable diseases: Secondary | ICD-10-CM | POA: Diagnosis not present

## 2019-08-15 ENCOUNTER — Ambulatory Visit: Payer: PPO | Admitting: Family Medicine

## 2019-08-16 ENCOUNTER — Telehealth: Payer: Self-pay

## 2019-08-16 MED ORDER — FLUCONAZOLE 150 MG PO TABS: 150.0000 mg | ORAL_TABLET | Freq: Every day | ORAL | 0 refills | Status: DC

## 2019-08-16 NOTE — Telephone Encounter (Signed)
Provided Rx per usual.  Will need to be seen if this does not treat the symptoms.

## 2019-08-16 NOTE — Telephone Encounter (Signed)
Patient calls nurse line stating she believes she has a yeast infection. Patient endorses burning, itching, and dysuria. Patient is unsure if she has discharge, as she wears a pad daily. I offered patient an apt, however would prefer not to come in due to covid. Patient stated, "I get yeast infections all the time." Will forward to PCP.  [employer]

## 2019-08-31 ENCOUNTER — Telehealth: Payer: Self-pay | Admitting: *Deleted

## 2019-08-31 NOTE — Telephone Encounter (Signed)
I ordered by completing the fax.

## 2019-08-31 NOTE — Telephone Encounter (Signed)
Received fax from pharmacy requesting the [place] (machine) says that pts machine is broken, will place in your box for reference if you need it Gwynn Crossley Zimmerman Rumple, CMA

## 2019-09-01 ENCOUNTER — Ambulatory Visit: Payer: HMO | Attending: Internal Medicine

## 2019-09-01 DIAGNOSIS — Z23 Encounter for immunization: Secondary | ICD-10-CM | POA: Insufficient documentation

## 2019-09-01 NOTE — Progress Notes (Signed)
   Covid-19 Vaccination Clinic  Name:  [name]    MRN: [id number] DOB: 12/19/1940  09/01/2019  [name] was observed post Covid-19 immunization for 15 minutes without incidence. She was provided with Vaccine Information Sheet and instruction to access the V-Safe system.   [name] was instructed to call 911 with any severe reactions post vaccine: Marland Kitchen Difficulty breathing  . Swelling of your face and throat  . A fast heartbeat  . A bad rash all over your body  . Dizziness and weakness    Immunizations Administered    Name Date Dose VIS Date Route   Pfizer COVID-19 Vaccine 09/01/2019  9:56 AM 0.3 mL 07/22/2019 Intramuscular   Manufacturer: [place]   Lot: [id number]   [place]: [id number]     b

## 2019-09-22 ENCOUNTER — Ambulatory Visit: Payer: HMO | Attending: Internal Medicine

## 2019-09-22 DIAGNOSIS — Z23 Encounter for immunization: Secondary | ICD-10-CM | POA: Insufficient documentation

## 2019-09-22 NOTE — Progress Notes (Signed)
   Covid-19 Vaccination Clinic  Name:  Shyree Pieczynski    MRN: [id number] DOB: 05/06/1941  09/22/2019  Ms. Rumery was observed post Covid-19 immunization for 15 minutes without incidence. She was provided with Vaccine Information Sheet and instruction to access the V-Safe system.   Ms. Kroft was instructed to call 911 with any severe reactions post vaccine: Marland Kitchen Difficulty breathing  . Swelling of your face and throat  . A fast heartbeat  . A bad rash all over your body  . Dizziness and weakness    Immunizations Administered    Name Date Dose VIS Date Route   Pfizer COVID-19 Vaccine 09/22/2019  9:52 AM 0.3 mL 07/22/2019 Intramuscular   Manufacturer: [place]   Lot: [id number]   [place]: [id number]

## 2019-10-10 ENCOUNTER — Ambulatory Visit (INDEPENDENT_AMBULATORY_CARE_PROVIDER_SITE_OTHER): Payer: HMO | Admitting: Family Medicine

## 2019-10-10 ENCOUNTER — Other Ambulatory Visit: Payer: Self-pay

## 2019-10-10 ENCOUNTER — Encounter: Payer: Self-pay | Admitting: Family Medicine

## 2019-10-10 VITALS — BP 102/58 | HR 100 | Wt 187.2 lb

## 2019-10-10 DIAGNOSIS — L409 Psoriasis, unspecified: Secondary | ICD-10-CM | POA: Diagnosis not present

## 2019-10-10 DIAGNOSIS — N760 Acute vaginitis: Secondary | ICD-10-CM | POA: Diagnosis not present

## 2019-10-10 DIAGNOSIS — E119 Type 2 diabetes mellitus without complications: Secondary | ICD-10-CM

## 2019-10-10 DIAGNOSIS — R32 Unspecified urinary incontinence: Secondary | ICD-10-CM

## 2019-10-10 LAB — POCT GLYCOSYLATED HEMOGLOBIN (HGB A1C): HbA1c, POC (controlled diabetic range): 8.4 % — AB (ref 0.0–7.0)

## 2019-10-10 MED ORDER — TRIAMCINOLONE ACETONIDE 0.5 % EX OINT: 1.0000 "application " | TOPICAL_OINTMENT | Freq: Two times a day (BID) | CUTANEOUS | 2 refills | Status: DC

## 2019-10-10 MED ORDER — FLUCONAZOLE 150 MG PO TABS: 150.0000 mg | ORAL_TABLET | Freq: Every day | ORAL | 1 refills | Status: DC

## 2019-10-10 MED ORDER — OZEMPIC (0.25 OR 0.5 MG/DOSE) 2 MG/1.5ML ~~LOC~~ SOPN: 0.5000 mg | PEN_INJECTOR | SUBCUTANEOUS | 3 refills | Status: DC

## 2019-10-10 NOTE — Patient Instructions (Addendum)
Someone should call in the next few days about a urology appointment. I sent in a new diabetes medicine that you should take by injection one time per week.  See me in one month.  I likley will want to step you up to the next higher dose of the medicine.   I sent in the yeast infection pill. I also sent in a psoriasis ointment, just for your elbows.   I will see you in one month to talk about your back, your diabetes and your energy level.

## 2019-10-10 NOTE — Progress Notes (Signed)
    SUBJECTIVE:   CHIEF COMPLAINT / HPI:   Multiple problems. 1. C/O urinary incontinence, longstanding complaint.  Getting worse.  Not currently on any meds.  I have treated with oxybutinin in the past without much success.  Sounds by hx like mixed urge and stress incontinence.  States she is wet all the time and limits her ability to go out.  Of note, problem list includes bladder prolapse. 2. DM control is much worse.  Now off her metformin due to GI intollerence.  On flosin, which "gives me a yeast infection."  Wt is at her usual, no recent gain.  She is will to use an injections. 3. Yeast infections.  Frequently with urinary incontinence and SGLT2 inhibiotr.   4. Psoriasis on elbows.  Does not want to go back to derm.   5.  She had other complaints of fatigue and low back pain.  Through the process of agenda setting, we elected to put these off until next visit.    OBJECTIVE:   BP (!) 102/58   Pulse 100   Wt 187 lb 3.2 oz (84.9 kg)   SpO2 93%   BMI 38.13 kg/m   VS noted including wt.  She recongizes that wt is at the center of several of her problmes.   Abd benign. Small psoriasis plaques on elbows. A1C noted to be 8.4  ASSESSMENT/PLAN:   No problem-specific Assessment & Plan notes found for this encounter.     Zenia Resides, MD [place]

## 2019-10-10 NOTE — Assessment & Plan Note (Signed)
Urology referral per patient request.

## 2019-10-10 NOTE — Assessment & Plan Note (Signed)
She is willing to do once weekly injection.  She was excited that GLP1 may help with wt loss.  Add ozempic.  FU one month.

## 2019-10-10 NOTE — Assessment & Plan Note (Signed)
High dose steroid on elbow plaques.  Not on face.

## 2019-10-10 NOTE — Assessment & Plan Note (Signed)
Will empirically treat with diflucan.  At risk with SGLT2 and incontinence.

## 2019-10-13 ENCOUNTER — Other Ambulatory Visit: Payer: Self-pay | Admitting: Family Medicine

## 2019-10-13 DIAGNOSIS — E119 Type 2 diabetes mellitus without complications: Secondary | ICD-10-CM

## 2019-10-17 DIAGNOSIS — H43813 Vitreous degeneration, bilateral: Secondary | ICD-10-CM | POA: Diagnosis not present

## 2019-10-17 DIAGNOSIS — H35371 Puckering of macula, right eye: Secondary | ICD-10-CM | POA: Diagnosis not present

## 2019-10-17 DIAGNOSIS — E113493 Type 2 diabetes mellitus with severe nonproliferative diabetic retinopathy without macular edema, bilateral: Secondary | ICD-10-CM | POA: Diagnosis not present

## 2019-10-17 DIAGNOSIS — H353132 Nonexudative age-related macular degeneration, bilateral, intermediate dry stage: Secondary | ICD-10-CM | POA: Diagnosis not present

## 2019-11-03 ENCOUNTER — Other Ambulatory Visit: Payer: Self-pay

## 2019-11-03 NOTE — Patient Outreach (Signed)
[place] [facility]) Care Management Chronic Special Needs Program  11/03/2019  Name: Alleena Gandolfo DOB: Feb 02, 1941  MRN: [id number]  Ms. Latreace Belko is enrolled in a chronic special needs plan for Diabetes. Chronic Care Management Coordinator telephoned client to review health risk assessment and to develop individualized care plan.  HIPAA verified.   Introduced the chronic care management program, importance of client participation, and taking their care plan to all provider appointments and inpatient facilities.   Subjective:Client reports her doctor recently added Ozempic to her diabetic medication regime. Client states she has been on Invokana for sometime but thinks the Ozempic is bringing down her blood sugar. RNCM discussed frequent yeast infections due to certain diabetic medications. Client states she is currently on diflucan due to yeast infection.  Advised client to take the medication as prescribed.  Client reports blood sugar ranges from 102- 170's.  Client denies any blood sugar lows <70.  RNCM advised client to continue to monitor blood sugars regularly and at least 2-3 times per day. Advised client to report blood sugar drops below 70 to her primary care provider. Client verbalized understanding. Client states she has macular degeneration. Client reports her husband is able to help her if she has difficulty seeing her medications. Client states she is able to manager her medications on her own at this time. Client denies need for Advanced directive packet. She states she and her husband completed their Advanced Directive with a Chief Executive Officer. Client states she suffers with urinary incontinence which has gotten worse. She states her doctor has referred her to a urologist who she will see on 11/10/19.    Goals Addressed            This Visit's Progress   . Client understands the importance of follow-up with providers by attending scheduled visits       Primary care provider  visits: 10/10/19, 06/16/19, 03/07/19 Continue to maintain and keep follow up visits with provider.    . Client will verbalize knowledge of self management of Hypertension as evidences by BP reading of 140/90 or less; or as defined by provider       Assessed High blood pressure self management skills. Advised client to get a blood pressure monitor to check blood sugars 1-2 times per week Counseled client that [place] may lower blood pressure due to diuretic effect.  RN case manager will send client education article: High blood pressure: What you can do Follow up with your provider as recommended. Take your medication as prescribe Call your doctor if you have questions.      Marland Kitchen HEMOGLOBIN A1C < 7 (pt-stated)       Hgb A1c completed 10/10/19 Discussed diabetes self management actions:  Glucose monitoring per provider recommendation  Check feet daily  Visit provider every 3-6 months as directed  Hbg A1C level every 3-6 months.  Eye Exam yearly  Carbohydrate controlled meal planning  Taking diabetes medication as prescribed by provider  Physical activity     . Maintain timely refills of diabetic medication as prescribed within the year .       Client reports good medication taking behavior Review of medical record indicates client maintains timely refills of diabetic medications Take medications as prescribed.  Follow up with your doctor if you have questions Contact your assigned RN case manager [phone] you have difficulty obtaining your medications.     . Obtain annual  Lipid Profile, LDL-C       Last completed 11/06/16 The  goal for LDL is less than 70 mg/ dl as you are at high risk for complications try to avoid saturated fats, trans-fats, and eat more fiber.  RN case manager will send client education article: Heart healthy diet.     . Obtain Annual Eye (retinal)  Exam        Last completed 06/19/19 Client reports having macular degeneration RN case manager will send  client education article: Age related macular degeneration    . Obtain Annual Foot Exam       Last completed 01/20/19 Diabetes foot care - Check feet daily at home (look for skin color changes, cuts, sores or cracks in the skin, swelling of feet or ankles, ingrown or fungal toenails, corn or calluses). Report these findings to your doctor - Wash feet with soap and water, dry feet well especially between toes - Moisturize your feet but not between the toes - Always wear shoes that protect your whole feet.      . Obtain annual screen for micro albuminuria (urine) , nephropathy (kidney problems)       Discussed with client importance of yearly physicals and follow up with primary care provide and complete labs as recommended.     . Obtain Hemoglobin A1C at least 2 times per year       Last completed 10/10/19, 01/07/19 Continue to keep your follow up appointments with your provider and have lab work completed as recommended.    . Patient stated: Will keep follow up appointment with urologist. (pt-stated)       RN case manager encouraged client to keep follow up appointment with urologist.  Advised to contact urology office for questions or if rescheduling is needed.  RN care manager will send client education article: Urinary Incontinence.    . Visit Primary Care Provider or Endocrinologist at least 2 times per year        Last completed primary care provider visit:  10/10/19/, 06/16/19 Continue to keep your follow up appointments with your provider.      Assessment: Client is not meeting diabetes self-management goal of hemoglobin A1C of <7.0% with most recent reading of8.4 % on 3/1 /21 without reports of hypoglycemia . Client has good understanding of:  COVID-19 cause, symptoms, precautions (social distancing, stay at home order, hand washing, wear face covering when unable to maintain or ensure 6 foot social distancing), and symptoms requiring provider notification. RNCM advised client to  notify MD of any changes in condition prior to scheduled appointment. Client advised to contact RNCM as needed and contact their HTA concierge for benefit questions.  RNCM provided client 24 hour HTA nurse advise line number [phone]  [facility] verified client aware of 911 services for urgent/ emergent needs.   Plan:  Send successful outreach letter with a copy of their individualized care plan, Send individual care plan to provider and Send educational material  Chronic care management coordination will outreach in:  12 months   Quinn Plowman RN,BSN,CCM [facility] [phone]

## 2019-11-08 ENCOUNTER — Other Ambulatory Visit: Payer: Self-pay

## 2019-11-08 DIAGNOSIS — E119 Type 2 diabetes mellitus without complications: Secondary | ICD-10-CM

## 2019-11-08 MED ORDER — ONETOUCH VERIO VI STRP: 1.0000 | ORAL_STRIP | Freq: Two times a day (BID) | 3 refills | Status: DC

## 2019-11-08 MED ORDER — ONETOUCH DELICA PLUS LANCET33G MISC: 1.0000 | Freq: Two times a day (BID) | 3 refills | Status: DC

## 2019-11-10 DIAGNOSIS — N3946 Mixed incontinence: Secondary | ICD-10-CM | POA: Diagnosis not present

## 2019-11-10 DIAGNOSIS — R351 Nocturia: Secondary | ICD-10-CM | POA: Diagnosis not present

## 2019-11-10 DIAGNOSIS — R350 Frequency of micturition: Secondary | ICD-10-CM | POA: Diagnosis not present

## 2019-11-10 DIAGNOSIS — N3942 Incontinence without sensory awareness: Secondary | ICD-10-CM | POA: Diagnosis not present

## 2019-11-16 ENCOUNTER — Other Ambulatory Visit: Payer: Self-pay

## 2019-11-16 ENCOUNTER — Ambulatory Visit (INDEPENDENT_AMBULATORY_CARE_PROVIDER_SITE_OTHER): Payer: HMO | Admitting: Family Medicine

## 2019-11-16 ENCOUNTER — Encounter: Payer: Self-pay | Admitting: Family Medicine

## 2019-11-16 DIAGNOSIS — Z598 Other problems related to housing and economic circumstances: Secondary | ICD-10-CM

## 2019-11-16 DIAGNOSIS — M545 Low back pain, unspecified: Secondary | ICD-10-CM

## 2019-11-16 DIAGNOSIS — R32 Unspecified urinary incontinence: Secondary | ICD-10-CM

## 2019-11-16 DIAGNOSIS — R112 Nausea with vomiting, unspecified: Secondary | ICD-10-CM

## 2019-11-16 DIAGNOSIS — E119 Type 2 diabetes mellitus without complications: Secondary | ICD-10-CM

## 2019-11-16 DIAGNOSIS — N760 Acute vaginitis: Secondary | ICD-10-CM

## 2019-11-16 DIAGNOSIS — E7800 Pure hypercholesterolemia, unspecified: Secondary | ICD-10-CM | POA: Diagnosis not present

## 2019-11-16 DIAGNOSIS — Z599 Problem related to housing and economic circumstances, unspecified: Secondary | ICD-10-CM | POA: Insufficient documentation

## 2019-11-16 MED ORDER — FLUCONAZOLE 150 MG PO TABS: 150.0000 mg | ORAL_TABLET | Freq: Every day | ORAL | 1 refills | Status: DC

## 2019-11-16 MED ORDER — ONDANSETRON HCL 4 MG PO TABS: ORAL_TABLET | ORAL | 6 refills | Status: DC

## 2019-11-16 NOTE — Patient Instructions (Signed)
I will call with back x ray and cholesterol results. A pharmacologist should contact you about figuring out the diabetes meds. I sent in a prescription for yeast. Try taking tylenol one extra strength or two regular strength 3 or 4 times a day with the tramadol.  Keep using the topical also.  Sadly, there are no good fixes for back pain. I would enjoy seeing Sid if you decide its worth it.

## 2019-11-17 ENCOUNTER — Ambulatory Visit: Payer: Self-pay | Admitting: *Deleted

## 2019-11-17 LAB — LIPID PANEL
Chol/HDL Ratio: 3.7 ratio (ref 0.0–4.4)
Cholesterol, Total: 190 mg/dL (ref 100–199)
HDL: 52 mg/dL (ref >39)
LDL Chol Calc (NIH): 116 mg/dL — ABNORMAL HIGH (ref 0–99)
Triglycerides: 125 mg/dL (ref 0–149)
VLDL Cholesterol Cal: 22 mg/dL (ref 5–40)

## 2019-11-17 MED ORDER — ROSUVASTATIN CALCIUM 20 MG PO TABS: 20.0000 mg | ORAL_TABLET | Freq: Every day | ORAL | 3 refills | Status: DC

## 2019-11-17 NOTE — Chronic Care Management (AMB) (Signed)
  Chronic Care Management   Note  11/17/2019 Name: [name] MRN: [id number] DOB: 1941/07/24  [place] referral sent for assistance with DM Medication Management.   Follow up plan: [facility] follow [facility].   [place] Management Coordinator Direct Dial:  [phone]  Fax: [phone]

## 2019-11-18 ENCOUNTER — Encounter: Payer: Self-pay | Admitting: Family Medicine

## 2019-11-18 ENCOUNTER — Telehealth: Payer: Self-pay | Admitting: *Deleted

## 2019-11-18 NOTE — Telephone Encounter (Signed)
Noted and agree. 

## 2019-11-18 NOTE — Assessment & Plan Note (Signed)
Followed by Andreas [place]

## 2019-11-18 NOTE — Progress Notes (Signed)
    SUBJECTIVE:   CHIEF COMPLAINT / HPI:   Several issues: 1. Received denial from insurer on both [place].  Will involve CCM pharmacy to see what/if any options are available.   2. DM Home blood sugars great since starting Ozempic. 3. Due for lipid panel. On statin.   4. Incontinence.  Seen by urology who is following.  Treated for UTI.  With that treatments, she has sx of yeast vaginitis, common after antibiotics for her. 5. Worsening low back pain.  No radiation to legs.  No fever, bowel or bladder problems.  Using tramadol, topicals and duloxetine.  No trauma.  Worsening has been gradual.  Reviewed old Xrays.  Non recent.  Did have 2 mm of spondylolithesis   OBJECTIVE:   BP 104/60   Pulse (!) 101   Ht 4' 10.75" (1.492 m)   Wt 187 lb 3.2 oz (84.9 kg)   SpO2 97%   BMI 38.13 kg/m   Lungs clear Cardiac RRR without m or g Lumbar: bilateral paraspinous muscle tenderness  ASSESSMENT/PLAN:   No problem-specific Assessment & Plan notes found for this encounter.     Zenia Resides, MD [place]

## 2019-11-18 NOTE — Telephone Encounter (Signed)
Pt calls for the following reasons:  1. Her insurance company was able to get her the Ozempic thru a pt assistance program so she needs no help with that.  2.  The invokana will cost her $380 for a 70month supply because she dose not qualify for any discounts thru the manufacturer. She wants to know if there is something else she can take or if she should wait for the CCM to contact her.  Christen Bame, CMA

## 2019-11-18 NOTE — Assessment & Plan Note (Signed)
Check xrays and add regular tylenol to augment current pain regimen.

## 2019-11-18 NOTE — Assessment & Plan Note (Signed)
Diflucan for presumed yeast vaginitis.

## 2019-11-18 NOTE — Assessment & Plan Note (Signed)
Seemingly better control on Invokana and Ozempic.  [facility] Pharm referral for access/affordibility.

## 2019-11-18 NOTE — Assessment & Plan Note (Signed)
LDL not at goal.  We decided to try switch from simvastatin to rosuvastatin.

## 2019-11-21 DIAGNOSIS — N390 Urinary tract infection, site not specified: Secondary | ICD-10-CM | POA: Diagnosis not present

## 2019-11-21 DIAGNOSIS — N3942 Incontinence without sensory awareness: Secondary | ICD-10-CM | POA: Diagnosis not present

## 2019-11-21 DIAGNOSIS — N3944 Nocturnal enuresis: Secondary | ICD-10-CM | POA: Diagnosis not present

## 2019-11-21 DIAGNOSIS — N3946 Mixed incontinence: Secondary | ICD-10-CM | POA: Diagnosis not present

## 2019-11-30 ENCOUNTER — Ambulatory Visit
Admission: RE | Admit: 2019-11-30 | Discharge: 2019-11-30 | Disposition: A | Discharge status: Home / Self Care | Payer: HMO | Source: Ambulatory Visit | Attending: Family Medicine | Admitting: Family Medicine

## 2019-11-30 ENCOUNTER — Telehealth: Payer: Self-pay | Admitting: Family Medicine

## 2019-11-30 DIAGNOSIS — M545 Low back pain, unspecified: Secondary | ICD-10-CM

## 2019-11-30 NOTE — Telephone Encounter (Signed)
Pt walked in to submit [employer] Patient Assistance Program Application form to be completed.  Form placed in white team folder. Last appt 11/16/19

## 2019-12-01 NOTE — Telephone Encounter (Signed)
Called and given results.  No change in plan from last office visit.

## 2019-12-02 NOTE — Telephone Encounter (Signed)
Clinical info completed on medication application form.  Place form in Dr. Daleen Squibb box for completion.  Ottis Stain, CMA

## 2019-12-05 NOTE — Telephone Encounter (Signed)
Called patient and informed that form was ready for pick up. Copy made and placed in batch scanning. Original placed at front desk in file cabinet.   Talbot Grumbling, RN

## 2019-12-05 NOTE — Telephone Encounter (Signed)
Completed form and placed in RN box 

## 2019-12-06 ENCOUNTER — Other Ambulatory Visit: Payer: Self-pay | Admitting: Family Medicine

## 2019-12-23 ENCOUNTER — Telehealth: Payer: Self-pay | Admitting: *Deleted

## 2019-12-23 DIAGNOSIS — E119 Type 2 diabetes mellitus without complications: Secondary | ICD-10-CM

## 2019-12-23 MED ORDER — OZEMPIC (0.25 OR 0.5 MG/DOSE) 2 MG/1.5ML ~~LOC~~ SOPN: 0.5000 mg | PEN_INJECTOR | SUBCUTANEOUS | 0 refills | Status: DC

## 2019-12-23 NOTE — Telephone Encounter (Signed)
Patient came by to pick up Ozempic sample.

## 2019-12-23 NOTE — Telephone Encounter (Signed)
Pt calls to see if we received the form from [employer] that they faxed on 5/13.  The previous form we sent was incomplete per [facility].  There is no form in PCP box.  Advised I would send a message to PCP to see if he has seen the form.  Pt asked if we have samples of Ozempic in the meantime.    Placed 1 box in fridge for pt to pick up. Christen Bame, CMA

## 2019-12-26 NOTE — Telephone Encounter (Signed)
By my best recollection, I filled out and sent back the form last week.

## 2019-12-26 NOTE — Telephone Encounter (Signed)
Called [employer] and they have that an order was placed/processed today(12/26/19) for pt's refill of Ozempic and should be shipped to the office in 10-14 business days.

## 2020-01-04 DIAGNOSIS — N3942 Incontinence without sensory awareness: Secondary | ICD-10-CM | POA: Diagnosis not present

## 2020-01-04 DIAGNOSIS — N3944 Nocturnal enuresis: Secondary | ICD-10-CM | POA: Diagnosis not present

## 2020-01-16 ENCOUNTER — Other Ambulatory Visit: Payer: Self-pay

## 2020-01-16 ENCOUNTER — Ambulatory Visit (INDEPENDENT_AMBULATORY_CARE_PROVIDER_SITE_OTHER): Payer: HMO | Admitting: Family Medicine

## 2020-01-16 ENCOUNTER — Encounter: Payer: Self-pay | Admitting: Family Medicine

## 2020-01-16 VITALS — BP 102/58 | HR 103 | Ht 61.0 in | Wt 188.0 lb

## 2020-01-16 DIAGNOSIS — I10 Essential (primary) hypertension: Secondary | ICD-10-CM | POA: Diagnosis not present

## 2020-01-16 DIAGNOSIS — R238 Other skin changes: Secondary | ICD-10-CM

## 2020-01-16 DIAGNOSIS — M47816 Spondylosis without myelopathy or radiculopathy, lumbar region: Secondary | ICD-10-CM

## 2020-01-16 DIAGNOSIS — M48062 Spinal stenosis, lumbar region with neurogenic claudication: Secondary | ICD-10-CM

## 2020-01-16 DIAGNOSIS — E7800 Pure hypercholesterolemia, unspecified: Secondary | ICD-10-CM | POA: Diagnosis not present

## 2020-01-16 DIAGNOSIS — E119 Type 2 diabetes mellitus without complications: Secondary | ICD-10-CM | POA: Diagnosis not present

## 2020-01-16 LAB — POCT GLYCOSYLATED HEMOGLOBIN (HGB A1C): HbA1c, POC (controlled diabetic range): 7 % (ref 0.0–7.0)

## 2020-01-16 MED ORDER — ENALAPRIL MALEATE 5 MG PO TABS: 5.0000 mg | ORAL_TABLET | Freq: Every day | ORAL | 3 refills | Status: DC

## 2020-01-16 NOTE — Patient Instructions (Addendum)
Diabetes is great.  I will check on the invokana I want you to decrease your enalapril from 10 mg to 5 mg. Please see the dermatologist about your scalp.  Try your best to quit scratching. I will put in an order for physical therapy.  You will go there to learn exercises you can do for your back.  Someone should call within a week. Get back into water aerobics.

## 2020-01-18 ENCOUNTER — Encounter: Payer: Self-pay | Admitting: Family Medicine

## 2020-01-18 DIAGNOSIS — R238 Other skin changes: Secondary | ICD-10-CM | POA: Insufficient documentation

## 2020-01-18 NOTE — Assessment & Plan Note (Signed)
Excellent control.  Hopefully, we can find a way for her to remain on the effective therapy despite financial difficulties.

## 2020-01-18 NOTE — Assessment & Plan Note (Signed)
Over treated at this point.  Decrease enalapril from 10-5

## 2020-01-18 NOTE — Progress Notes (Signed)
    SUBJECTIVE:   CHIEF COMPLAINT / HPI:   Multiple issues: 1/  DM control excellent.  Her main problem is affording effective meds.  We have applied for drug company assistance.  We have received the ozempic and will give to her.  We have not received the invokana.   2. BP is low today.  Some lightheaedness.  No CP or SOB 3. Sores on scalp.  Pruritic and she continues to scratch.  No fever or drainage.  Has previously ssen derm. 4. Low back pain is worse.  Has tingling of toes, both feed (radiculopathy versus neuropathy?))  Prevents her from exercising.  Wants referral to specialist because it is "getting bad."    OBJECTIVE:   BP (!) 102/58   Pulse (!) 103   Ht 5\' 1"  (1.549 m)   Wt 188 lb (85.3 kg)   SpO2 96%   BMI 35.52 kg/m   VS noted including low BP.   Lungs clear Cardiac RRR without m or g Abd benign.   Low back, paraspinous muscle tenderness and spasm Skin, Mild folliculitis of scalp.   ASSESSMENT/PLAN:   Spinal stenosis of lumbar region Refer to PT.  She does not want surgery.  Limited options for chronic low back pain.  Strongly want her to get exercising for wt loss.  Water aerobics - she has done before.  HYPERTENSION, BENIGN SYSTEMIC Over treated at this point.  Decrease enalapril from 10-5  Non-insulin dependent type 2 diabetes mellitus (HCC) Excellent control.  Hopefully, we can find a way for her to remain on the effective therapy despite financial difficulties.  Scalp irritation Seems minor.  States intensely pruritic and cannot stop scratching.  Refer back to derm.     Zenia Resides, MD [place]

## 2020-01-18 NOTE — Assessment & Plan Note (Signed)
Refer to PT.  She does not want surgery.  Limited options for chronic low back pain.  Strongly want her to get exercising for wt loss.  Water aerobics - she has done before.

## 2020-01-18 NOTE — Assessment & Plan Note (Signed)
Seems minor.  States intensely pruritic and cannot stop scratching.  Refer back to derm.

## 2020-01-20 ENCOUNTER — Telehealth: Payer: Self-pay | Admitting: Pharmacist

## 2020-01-20 NOTE — Telephone Encounter (Signed)
[place] Support team.   Shared that this patient is in need of support for an SGLT2 inhibitor.   Switch was made from Invokana 300mg   to  Jardiance 25mg  as this will the equivalent AND is likely an easier acquisition for the patient through the manufacturer program.    The [facility] pharmacy tech plans to call patient today and initiate process for Jardiance 25mg  once daily.  Provided her verbal Rx with refills for 1 year.

## 2020-01-20 NOTE — Telephone Encounter (Signed)
The patient and I both appreciate the team support.

## 2020-01-20 NOTE — Telephone Encounter (Signed)
-----   Message from [place], [facility] sent at 01/16/2020  2:36 PM EDT ----- Regarding: RE: Invokana request Update is that the referral was sent over to the patient assistance program through her insurance [facility]) as they are now contracted to do this work.  [facility] RN has spoken with patient and advised her to contact [place] or call the program directly to get update on application status.  I do hope she can continue to have access to this medication- the Invokana program through [employer] is difficult though as she has to spend 4% of annual income on co-pays this calendar year before she is eligible besides the income requirement.  FYI could consider changing to dapagliflozin ([employer] - waived out-of-pocket requirement this year) or empagliflozin (BI - no out-of-pocket requirement).     ----- Message ----- From: Zenia Resides, MD Sent: 01/16/2020   2:02 PM EDT To: Leavy Cella, RPH-CPP, Rudean Haskell, RPH Subject: RE: Invokana request                           Thank you.  The good news is that her DM is now under great control.  The bad news is that it will not be in control for long if she doesn't have access to her meds.  I appreciated the great team effort. Bill ----- Message ----- From: Rudean Haskell, [facility] Sent: 01/16/2020  11:53 AM EDT To: Zenia Resides, MD, Leavy Cella, RPH-CPP Subject: RE: Anastasio Auerbach request                           Christianne Borrow,  It looks like this referral went to [place] [facility] pharmacy technician Sharee Pimple for patient assistance. I will forward on to them to see if they can provide any updates on the [facility].    Colleen  ----- Message ----- From: Leavy Cella, RPH-CPP Sent: 01/16/2020  11:36 AM EDT To: Zenia Resides, MD, Rudean Haskell, RPH Subject: Anastasio Auerbach request                               Jaclyn Shaggy,   Patient came to appointment today and is picking up [facility] care supply that arrived last week!    Good news.   The patient has not received any Invokana and is wondering if all steps have been completed.   Dr. Andria Frames is wondering if he needs to complete anything.   Please advise.

## 2020-01-23 ENCOUNTER — Telehealth: Payer: Self-pay

## 2020-01-23 DIAGNOSIS — E119 Type 2 diabetes mellitus without complications: Secondary | ICD-10-CM

## 2020-01-23 NOTE — Telephone Encounter (Signed)
Patient and Gregary Signs from [place] calls nurse requesting rx for [name] be sent into [facility] on [address]. Per Gregary Signs, they have to have the "price" of the medication being ran through the pharmacy for the medication assistance program.   To PCP  Talbot Grumbling, RN

## 2020-01-24 MED ORDER — EMPAGLIFLOZIN 25 MG PO TABS: 25.0000 mg | ORAL_TABLET | Freq: Every day | ORAL | 3 refills | Status: DC

## 2020-01-24 NOTE — Assessment & Plan Note (Signed)
Change invokana to jardiance as suggested by pharm to help with affordability.

## 2020-01-24 NOTE — Telephone Encounter (Signed)
Called and spoke with patient regarding prescription for jardiance. Patient states that she has spoken with pharmacist and states that 51-month supply will cost her 382.59 without medication assistance.   Routing to Dr. Valentina Lucks for further processing with medication assistance program.   Talbot Grumbling, RN

## 2020-01-24 NOTE — Telephone Encounter (Signed)
Rx sent as requested. Thanks for the teamwork.

## 2020-01-25 NOTE — Telephone Encounter (Signed)
There is a 14 day free trial for Jardiance that the patient can use and also patient assistance available for insured patients.  I called [name] and asked if she would be interested in applying and she stated that she would.  I explained to her that I would be sending a 14 day free trial coupon to her pharmacy and would mail(pt preference) her the pt assistance application.  I encouraged her to complete the application and return it to the office as soon as she could so that the application could be processed in a timely manner.

## 2020-01-30 ENCOUNTER — Other Ambulatory Visit: Payer: Self-pay | Admitting: Family Medicine

## 2020-01-30 DIAGNOSIS — N760 Acute vaginitis: Secondary | ICD-10-CM

## 2020-01-30 DIAGNOSIS — F4323 Adjustment disorder with mixed anxiety and depressed mood: Secondary | ICD-10-CM

## 2020-01-31 NOTE — Telephone Encounter (Signed)
Spoke to pt via phone and assisted her in completing the patient portion of the Jardiance pt. Assistance application.  She stated that she would try to return it to [facility] today or tomorrow.  I faxed the 14 day free trial offer to Family Medicine attn: front desk 4:25pm 01/31/20

## 2020-02-01 DIAGNOSIS — L304 Erythema intertrigo: Secondary | ICD-10-CM | POA: Diagnosis not present

## 2020-02-01 DIAGNOSIS — L218 Other seborrheic dermatitis: Secondary | ICD-10-CM | POA: Diagnosis not present

## 2020-02-01 DIAGNOSIS — L2084 Intrinsic (allergic) eczema: Secondary | ICD-10-CM | POA: Diagnosis not present

## 2020-02-02 ENCOUNTER — Ambulatory Visit: Payer: HMO | Admitting: Physical Therapy

## 2020-02-06 NOTE — Telephone Encounter (Signed)
Pt called today and states she has 3 days left of Jardiance and states she has already used a 14 day free trial coupon and cannot afford refill.  We are in the process of submitting an application for pt, will be faxed to [facility], 02/07/20.  Explained to pt that once submitted it takes 24-48 hours for the application to process and then 7-14 business days for the meds to be delivered if approved.  When we spoke on 01/31/20 pt indicated that she had not used coupon and was to pick it up from [facility] when she dropped off application on 23/55/73, but apparently pt has already used one and is not eligible for another.  When we spoke on 01/31/20, pt expressed an understanding of the process and was able to wait 2 weeks at that time.  But, now she states she has already used the coupon and not able to wait.  Pt is asking if office has samples or what options are available to her now?

## 2020-02-06 NOTE — Telephone Encounter (Signed)
We would have to bill her ins.  We could not use our $10 pricing.

## 2020-02-06 NOTE — Telephone Encounter (Signed)
Her copay for 1 month is $134, she could charge it on an account with [facility] but would have to put something towards it today and then can make payments until it is paid off.  2 weeks worth of meds is $62.63.

## 2020-02-06 NOTE — Telephone Encounter (Signed)
We do not sample this medication or any in the same class.   Is this something we could get a small supply from [facility]?

## 2020-02-06 NOTE — Telephone Encounter (Signed)
Patient calls nurse line updated me on below message from [place]. Patient is hopeful we have samples to offer her until application is processed. Patient has 3 pills left, however still needs to take one for today. Please advise.

## 2020-02-07 MED ORDER — EMPAGLIFLOZIN 25 MG PO TABS: 25.0000 mg | ORAL_TABLET | Freq: Every day | ORAL | 0 refills | Status: DC

## 2020-02-07 MED FILL — JARDIANCE 25 MG TABLET: 25 | 14 days supply | Qty: 14 | Fill #0

## 2020-02-07 NOTE — Telephone Encounter (Signed)
Contacted patient and discussed management options while we await supply from manufacturer.    Patient decided that the short-term 2 week supply for ~ $62 was her best option as her blood sugars have been well controlled.   She is willing to pick it up at the [facility].   I provided address, phone and directions to the [facility] building.   She plans to pick-up 2 weeks of Jardiance supply tomorrow.   Two week supply prescription sent to [facility] [facility].

## 2020-02-08 NOTE — Telephone Encounter (Signed)
Noted and agree. 

## 2020-02-09 ENCOUNTER — Telehealth: Payer: Self-pay

## 2020-02-09 NOTE — Telephone Encounter (Signed)
Jardiance application faxed to [facility] for patient assistance.

## 2020-02-15 ENCOUNTER — Telehealth: Payer: Self-pay

## 2020-02-15 ENCOUNTER — Other Ambulatory Visit: Payer: Self-pay

## 2020-02-15 DIAGNOSIS — N760 Acute vaginitis: Secondary | ICD-10-CM

## 2020-02-15 MED ORDER — FLUCONAZOLE 150 MG PO TABS: 150.0000 mg | ORAL_TABLET | Freq: Every day | ORAL | 0 refills | Status: DC

## 2020-02-15 NOTE — Telephone Encounter (Signed)
Patient calls nurse line reporting she is currently out of town and has left her diflucan at home. Patient is requesting a refill to be sent to pharmacy near her. [facility] in [place]. I have pended medication with correct pharmacy attached. Please send in if appropriate.

## 2020-02-15 NOTE — Telephone Encounter (Signed)
PATIENT ASSISTANCE WAS APPROVED WITH [employer]) THROUGH 08/10/2020.  PT WILL RECEIVE 3 MONTHS OF MEDICATION(JARDIANCE 25MG ) SHIPPED TO HOME ADDRESS.  MO [id number] AND [employer] PHONE # [phone].  PT MAY CALL AND USE [employer] AUTOMATED SYSTEM TO REQUEST REFILLS WHEN DUE.  PER [employer] REP, FIRST SHIPMENT IS SCHEDULED TO BE SHIPPED OUT TODAY.

## 2020-02-20 ENCOUNTER — Other Ambulatory Visit: Payer: Self-pay

## 2020-02-20 ENCOUNTER — Ambulatory Visit: Payer: HMO | Attending: Family Medicine | Admitting: Physical Therapy

## 2020-02-20 DIAGNOSIS — M62830 Muscle spasm of back: Secondary | ICD-10-CM | POA: Diagnosis not present

## 2020-02-20 DIAGNOSIS — R262 Difficulty in walking, not elsewhere classified: Secondary | ICD-10-CM | POA: Diagnosis not present

## 2020-02-20 DIAGNOSIS — R293 Abnormal posture: Secondary | ICD-10-CM | POA: Diagnosis not present

## 2020-02-20 DIAGNOSIS — M545 Low back pain, unspecified: Secondary | ICD-10-CM

## 2020-02-20 DIAGNOSIS — M6281 Muscle weakness (generalized): Secondary | ICD-10-CM | POA: Diagnosis not present

## 2020-02-20 DIAGNOSIS — G8929 Other chronic pain: Secondary | ICD-10-CM | POA: Diagnosis not present

## 2020-02-20 NOTE — Patient Instructions (Signed)
Access Code: [email or web address] URL: [email or web address] Date: 02/20/2020 Prepared by: [name] April Thurnell Garbe  Exercises Supine Hamstring Stretch with Strap - 1 x daily - 7 x weekly - 3 sets - 10 reps Seated Flexion Stretch with Swiss Ball - 1 x daily - 7 x weekly - 3 sets - 30 sec hold Seated Thoracic Flexion and Rotation with Swiss Ball - 1 x daily - 7 x weekly - 3 sets - 30 sec hold Standing Lumbar Extension at [place] - 1 x daily - 7 x weekly - 3 sets - 30 sec hold

## 2020-02-20 NOTE — Therapy (Signed)
[place], [place], [address] Phone: [phone]   Fax:  [phone]  Physical Therapy Evaluation  Patient Details  Name: [name] MRN: [id number] Date of Birth: [birth year] Referring Provider (PT): Zenia Resides, MD   Encounter Date: 02/20/2020   PT End of Session - 02/20/20 1447    Visit Number 1    Number of Visits 16    PT Start Time 1829    PT Stop Time 1455    PT Time Calculation (min) 40 min    Activity Tolerance Patient tolerated treatment well    Behavior During Therapy [facility] for tasks assessed/performed           Past Medical History:  Diagnosis Date  . Asthma   . Depression   . Diabetes mellitus without complication ([place])   . History of surgery on arm    right arm ( plate and screws)  . Hypertension   . Macular degeneration   . Migraine    without aura  . Urinary incontinence     Past Surgical History:  Procedure Laterality Date  . ABDOMINAL HYSTERECTOMY     ?Lt. ovary remains  . APPENDECTOMY    . Arm surgery    . BREAST BIOPSY Right 10/10/2009  . BREAST CYST ASPIRATION  12/20/2013  . BREAST EXCISIONAL BIOPSY Left 1998  . BREAST EXCISIONAL BIOPSY Right 1993    There were no vitals filed for this visit.    Subjective Assessment - 02/20/20 1419    Subjective Pt reports difficulty walking due to back pain. Pt states she requires something to hold on. Pt is no longer able to grocery shop by herself. Pt reports she fell in the early 6s and she attributes it to her initial issues. Sitting to standing hurts the most (10/10 pain). At rest pt reports 8/10 pain.    Limitations Walking;Lifting;Standing;House hold activities    How long can you stand comfortably? 10-15 minutes    How long can you walk comfortably? 100'    Diagnostic tests XR: arthritis & DDD    Patient Stated Goals 50% decrease in pain    Currently in Pain? Yes    Pain Score 8     Pain Location Back     Pain Orientation Right    Pain Descriptors / Indicators Aching;Sore;Spasm    Pain Type Chronic pain    Pain Onset More than a month ago    Pain Frequency Constant    Aggravating Factors  sitting to standing, walking, prolonged standing    Pain Relieving Factors sitting    Effect of Pain on Daily Activities Unable to cook without having to sit multiple times, unable to grocery shop and carry items without assistance, unable to amb prolonged amb              [facility] PT Assessment - 02/20/20 0001      Assessment   Medical Diagnosis M47.816 (ICD-10-CM) - Osteoarthritis of lumbar spine, unspecified spinal osteoarthritis complication status    Referring Provider (PT) Zenia Resides, MD    Prior Therapy Yes, for back pain      Precautions   Precautions None      Restrictions   Weight Bearing Restrictions No      Balance Screen   Has the patient fallen in the past 6 months Yes    How many times? 1-2    Has the patient had a decrease in activity level because of a  fear of falling?  No    Is the patient reluctant to leave their home because of a fear of falling?  Yes      [place] Private residence    Living Arrangements Spouse/significant other    Available Help at Discharge Family    Type of [place] to enter    Entrance Stairs-Number of Steps 1    [place] One level    [place] None      Prior Function   Level of [place] with basic ADLs      Observation/Other Assessments   Focus on Therapeutic Outcomes (FOTO)  68%      Posture/Postural Control   Posture/Postural Control Postural limitations    Postural Limitations Flexed trunk;Anterior pelvic tilt;Rounded Shoulders      ROM / Strength   AROM / PROM / Strength Strength      AROM   Lumbar Flexion 75% limited    Lumbar Extension 90% limited    Lumbar - Right Side Bend 80% limited    Lumbar - Left Side Bend 90% limited   with pain    Lumbar - Right Rotation 90% limited    Lumbar - Left Rotation 80% limited      Strength   Right Hip Flexion 3-/5    Right Hip External Rotation  3+/5    Right Hip Internal Rotation 4/5    Left Hip Flexion 3+/5    Left Hip External Rotation 3+/5    Left Hip Internal Rotation 4/5      Flexibility   Hamstrings 45 deg on L, 20 deg on R      Palpation   Palpation comment Tender QLs (R>L)      FABER test   findings Positive    Side Right      Straight Leg Raise   Findings Positive    Side  Right                      Objective measurements completed on examination: See above findings.       [facility] PT Treatment/Exercise - 02/20/20 0001      Lumbar Exercises: Stretches   Passive Hamstring Stretch Right;Left;30 seconds    Hip Flexor Stretch 30 seconds;Right;Left    Other Lumbar Stretch Exercise Forward flexion on table x 30 sec, forward flexion with side bend x 30 sec bilat                  PT Education - 02/20/20 1640    Education Details Educated pt on log rolling to protect her back during supine to sit. Discussed exam findings and initiating gentle stretching. Discussed self trigger point release with tennis ball.    Person(s) Educated Patient    Methods Explanation;Demonstration;Tactile cues;Verbal cues;Handout    Comprehension Verbalized understanding;Returned demonstration;Verbal cues required;Tactile cues required            PT Short Term Goals - 02/20/20 1643      PT SHORT TERM GOAL #1   Title Pt will be independent with initial HEP    Time 3    Period Weeks    Status New    Target Date 03/12/20             PT Long Term Goals - 02/20/20 1644      PT LONG TERM GOAL #1   Title Pt will be able to tolerate  standing for the full time it takes to make dinner (~30-45 min)    Baseline Requires 2-3 sitting breaks    Time 8    Period Weeks    Status New    Target Date 04/16/20      PT LONG TERM GOAL #2   Title Pt will be able  to tolerate ambulating at least 200' for community tasks    Baseline Unable to amb more than 100'    Time 8    Period Weeks    Status New    Target Date 04/16/20      PT LONG TERM GOAL #3   Title Pt will be able to carry <5# of groceries to assist her husband    Baseline Unable    Time 8    Period Weeks    Status New    Target Date 04/16/20      PT LONG TERM GOAL #4   Title Pt will report 50% decrease in overall pain.    Baseline 10/10 pain at worst, 8/10 pain at rest    Time 8    Period Weeks    Status New    Target Date 04/16/20      PT LONG TERM GOAL #5   Title Pt will improve FOTO score from 68% to 51% limitation    Time 8    Period Weeks    Status New    Target Date 04/16/20                  Plan - 02/20/20 1448    Clinical Impression Statement Pt is a 79 y/o F presenting to clinic with worsening low back pain. Pt demonstrates forward trunk flexed posture, increased quadratus lumborum, hamstring, and hip tightness, decreased hip strength, and poor tolerance to standing activities affecting her ability to perform household and community tasks. Pt is not able to tolerate sidelying or prone positioning. Pt would benefit from therapy to address these issues and optimize her level of function.    Personal Factors and Comorbidities Age;Comorbidity 1;Time since onset of injury/illness/exacerbation;Comorbidity 2;Past/Current Experience    Comorbidities diabetes, arthritis, previous R shoulder injury    Examination-Activity Limitations Bed Mobility;Caring for Others;Locomotion Level;Lift;Squat;Stairs;Stand;Transfers    Examination-Participation Restrictions Cleaning;Community Activity;Shop    Stability/Clinical Decision Making Evolving/Moderate complexity    Clinical Decision Making Moderate    Rehab Potential Good    PT Frequency 2x / week    PT Duration 8 weeks    PT Treatment/Interventions ADLs/Self Care Home Management;Cryotherapy;Electrical  Stimulation;Iontophoresis 4mg /ml Dexamethasone;Moist Heat;Traction    PT Next Visit Plan Assess response to HEP. Initiate core strengthening. Continue to stretch hip flexors/hamstring and progress pt into trunk extension. Consider manual therapy and hip mobs.    PT Home Exercise Plan Access Code: [email or web address]    Consulted and Agree with Plan of Care Patient           Patient will benefit from skilled therapeutic intervention in order to improve the following deficits and impairments:  Abnormal gait, Decreased balance, Decreased mobility, Difficulty walking, Hypomobility, Increased muscle spasms, Decreased range of motion, Improper body mechanics, Decreased activity tolerance, Decreased strength, Impaired flexibility, Increased fascial restricitons, Postural dysfunction, Pain  Visit Diagnosis: Chronic bilateral low back pain without sciatica  Muscle spasm of back  Muscle weakness (generalized)  Abnormal posture  Difficulty in walking, not elsewhere classified     Problem List Patient Active Problem List   Diagnosis Date Noted  . Scalp irritation 01/18/2020  .  Financial difficulties 11/16/2019  . Osteoarthritis of left knee 06/13/2019  . Rotator cuff syndrome of right shoulder 01/21/2019  . Bladder prolapse, female, acquired 08/16/2018  . Urinary incontinence 07/05/2018  . Constipation 07/05/2018  . Frequent falls 12/24/2017  . Rotator cuff syndrome, left 06/19/2017  . Adjustment reaction with anxiety and depression 03/26/2017  . Ruptured silicone breast implant 12/08/2016  . Iron deficiency anemia due to chronic blood loss 10/31/2015  . Chronic right-sided thoracic back pain 07/26/2015  . Spinal stenosis of lumbar region 07/10/2015  . Abdominal aortic atherosclerosis ([place]) 07/10/2015  . Fatigue 04/04/2011  . Sleep apnea 05/22/2010  . PERIPHERAL NEUROPATHY 02/06/2010  . PULMONARY NODULE, SOLITARY 07/20/2009  . Irritable bowel syndrome 11/12/2007  . Non-insulin dependent type  2 diabetes mellitus ([place]) 10/08/2006  . HYPERCHOLESTEROLEMIA 10/08/2006  . HYPERTRIGLYCERIDEMIA 10/08/2006  . Migraine headache 10/08/2006  . CARPAL TUNNEL SYNDROME 10/08/2006  . HYPERTENSION, BENIGN SYSTEMIC 10/08/2006  . ASTHMA, PERSISTENT, MODERATE 10/08/2006  . REFLUX ESOPHAGITIS 10/08/2006  . Eczema 10/08/2006  . Psoriasis 10/08/2006  . Osteoarthritis of spine 10/08/2006  . VERTIGO NOS OR DIZZINESS 10/08/2006    [facility] [address] PT, DPT 02/20/2020, 4:52 PM  [facility] [address] [place], [place], [address] Phone: [phone]   Fax:  [phone]  Name: [name] MRN: [id number] Date of Birth: 08-25-1940

## 2020-02-29 DIAGNOSIS — R350 Frequency of micturition: Secondary | ICD-10-CM | POA: Diagnosis not present

## 2020-02-29 DIAGNOSIS — N3942 Incontinence without sensory awareness: Secondary | ICD-10-CM | POA: Diagnosis not present

## 2020-03-21 ENCOUNTER — Other Ambulatory Visit: Payer: Self-pay

## 2020-03-21 ENCOUNTER — Ambulatory Visit: Payer: HMO | Attending: Family Medicine | Admitting: Physical Therapy

## 2020-03-21 DIAGNOSIS — R293 Abnormal posture: Secondary | ICD-10-CM | POA: Insufficient documentation

## 2020-03-21 DIAGNOSIS — R262 Difficulty in walking, not elsewhere classified: Secondary | ICD-10-CM | POA: Diagnosis not present

## 2020-03-21 DIAGNOSIS — M62830 Muscle spasm of back: Secondary | ICD-10-CM | POA: Diagnosis not present

## 2020-03-21 DIAGNOSIS — G8929 Other chronic pain: Secondary | ICD-10-CM | POA: Diagnosis not present

## 2020-03-21 DIAGNOSIS — M545 Low back pain, unspecified: Secondary | ICD-10-CM

## 2020-03-21 DIAGNOSIS — M6281 Muscle weakness (generalized): Secondary | ICD-10-CM | POA: Diagnosis not present

## 2020-03-21 NOTE — Therapy (Signed)
[place], [place], [address] Phone: [phone]   Fax:  [phone]  Physical Therapy Treatment  Patient Details  Name: [name] MRN: [id number] Date of Birth: Jan 22, 1941 Referring Provider (PT): Zenia Resides, MD   Encounter Date: 03/21/2020   PT End of Session - 03/21/20 1323    Visit Number 2    Number of Visits 16    PT Start Time 1324    PT Stop Time 1405    PT Time Calculation (min) 41 min    Activity Tolerance Patient tolerated treatment well    Behavior During Therapy [facility] for tasks assessed/performed           Past Medical History:  Diagnosis Date  . Asthma   . Depression   . Diabetes mellitus without complication ([place])   . History of surgery on arm    right arm ( plate and screws)  . Hypertension   . Macular degeneration   . Migraine    without aura  . Urinary incontinence     Past Surgical History:  Procedure Laterality Date  . ABDOMINAL HYSTERECTOMY     ?Lt. ovary remains  . APPENDECTOMY    . Arm surgery    . BREAST BIOPSY Right 10/10/2009  . BREAST CYST ASPIRATION  12/20/2013  . BREAST EXCISIONAL BIOPSY Left 1998  . BREAST EXCISIONAL BIOPSY Right 1993    There were no vitals filed for this visit.   Subjective Assessment - 03/21/20 1328    Subjective Pt reports she's been busy. She states she tries to do the stretches everyday and she can feel some difference. She notes tightness in her thoracic spine especially on the right side (this has been ongoing x 4 weeks). Shots helped it the first time but the times after it didn't help much longer.    Limitations Walking;Lifting;Standing;House hold activities    How long can you stand comfortably? 10-15 minutes    How long can you walk comfortably? 100'    Diagnostic tests XR: arthritis & DDD    Patient Stated Goals 50% decrease in pain    Currently in Pain? Yes    Pain Score 8     Pain Location Thoracic    Pain  Orientation Right    Pain Descriptors / Indicators Tightness    Pain Type Chronic pain    Pain Radiating Towards LBP    Pain Onset More than a month ago    Aggravating Factors  Getting up in the morning                             [facility] Adult PT Treatment/Exercise - 03/21/20 0001      Neck Exercises: Seated   Neck Retraction 10 reps      Lumbar Exercises: Stretches   Active Hamstring Stretch Right;Left;30 seconds    Single Knee to Chest Stretch 20 seconds;Right;Left    Lower Trunk Rotation 10 seconds;3 reps      Lumbar Exercises: Supine   Ab Set 10 reps    Pelvic Tilt 10 reps    Bridge 10 reps      Lumbar Exercises: Sidelying   Other Sidelying Lumbar Exercises Open/close book x 10 bilat on r sidelying only   difficulty performing L sidelying due to R RTC     Neck Exercises: Stretches   Upper Trapezius Stretch Right;Left;30 seconds    Levator Stretch Right;Left;30 seconds  PT Short Term Goals - 02/20/20 1643      PT SHORT TERM GOAL #1   Title Pt will be independent with initial HEP    Time 3    Period Weeks    Status New    Target Date 03/12/20             PT Long Term Goals - 02/20/20 1644      PT LONG TERM GOAL #1   Title Pt will be able to tolerate standing for the full time it takes to make dinner (~30-45 min)    Baseline Requires 2-3 sitting breaks    Time 8    Period Weeks    Status New    Target Date 04/16/20      PT LONG TERM GOAL #2   Title Pt will be able to tolerate ambulating at least 200' for community tasks    Baseline Unable to amb more than 100'    Time 8    Period Weeks    Status New    Target Date 04/16/20      PT LONG TERM GOAL #3   Title Pt will be able to carry <5# of groceries to assist her husband    Baseline Unable    Time 8    Period Weeks    Status New    Target Date 04/16/20      PT LONG TERM GOAL #4   Title Pt will report 50% decrease in overall pain.    Baseline 10/10  pain at worst, 8/10 pain at rest    Time 8    Period Weeks    Status New    Target Date 04/16/20      PT LONG TERM GOAL #5   Title Pt will improve FOTO score from 68% to 51% limitation    Time 8    Period Weeks    Status New    Target Date 04/16/20                 Plan - 03/21/20 1344    Clinical Impression Statement Pt with improving back and hip flexibility; initiated core and hip strengthening this session. Pt able to demonstrate good transversus abdominis contraction. Treatment focused on continued spinal stabilization and posture including scapular and neck retraction.    Personal Factors and Comorbidities Age;Comorbidity 1;Time since onset of injury/illness/exacerbation;Comorbidity 2;Past/Current Experience    Comorbidities diabetes, arthritis, previous R shoulder injury    Examination-Activity Limitations Bed Mobility;Caring for Others;Locomotion Level;Lift;Squat;Stairs;Stand;Transfers    Examination-Participation Restrictions Cleaning;Community Activity;Shop    Stability/Clinical Decision Making Evolving/Moderate complexity    Rehab Potential Good    PT Frequency 2x / week    PT Duration 8 weeks    PT Treatment/Interventions ADLs/Self Care Home Management;Cryotherapy;Electrical Stimulation;Iontophoresis 4mg /ml Dexamethasone;Moist Heat;Traction    PT Next Visit Plan Assess response to HEP. Initiate core strengthening. Continue to stretch hip flexors/hamstring and progress pt into trunk extension. Consider manual therapy and hip mobs. Continue scapular and neck retraction.    PT Home Exercise Plan Access Code: [email or web address]    Consulted and Agree with Plan of Care Patient           Patient will benefit from skilled therapeutic intervention in order to improve the following deficits and impairments:  Abnormal gait, Decreased balance, Decreased mobility, Difficulty walking, Hypomobility, Increased muscle spasms, Decreased range of motion, Improper body mechanics, Decreased  activity tolerance, Decreased strength, Impaired flexibility, Increased fascial restricitons, Postural dysfunction, Pain  Visit Diagnosis:  Chronic bilateral low back pain without sciatica  Muscle spasm of back  Muscle weakness (generalized)  Abnormal posture  Difficulty in walking, not elsewhere classified     Problem List Patient Active Problem List   Diagnosis Date Noted  . Scalp irritation 01/18/2020  . Financial difficulties 11/16/2019  . Osteoarthritis of left knee 06/13/2019  . Rotator cuff syndrome of right shoulder 01/21/2019  . Bladder prolapse, female, acquired 08/16/2018  . Urinary incontinence 07/05/2018  . Constipation 07/05/2018  . Frequent falls 12/24/2017  . Rotator cuff syndrome, left 06/19/2017  . Adjustment reaction with anxiety and depression 03/26/2017  . Ruptured silicone breast implant 12/08/2016  . Iron deficiency anemia due to chronic blood loss 10/31/2015  . Chronic right-sided thoracic back pain 07/26/2015  . Spinal stenosis of lumbar region 07/10/2015  . Abdominal aortic atherosclerosis ([place]) 07/10/2015  . Fatigue 04/04/2011  . Sleep apnea 05/22/2010  . PERIPHERAL NEUROPATHY 02/06/2010  . PULMONARY NODULE, SOLITARY 07/20/2009  . Irritable bowel syndrome 11/12/2007  . Non-insulin dependent type 2 diabetes mellitus ([place]) 10/08/2006  . HYPERCHOLESTEROLEMIA 10/08/2006  . HYPERTRIGLYCERIDEMIA 10/08/2006  . Migraine headache 10/08/2006  . CARPAL TUNNEL SYNDROME 10/08/2006  . HYPERTENSION, BENIGN SYSTEMIC 10/08/2006  . ASTHMA, PERSISTENT, MODERATE 10/08/2006  . REFLUX ESOPHAGITIS 10/08/2006  . Eczema 10/08/2006  . Psoriasis 10/08/2006  . Osteoarthritis of spine 10/08/2006  . VERTIGO NOS OR DIZZINESS 10/08/2006    [facility] [address] PT, DPT 03/21/2020, 2:12 PM  [facility] [address] [place], [place], [address] Phone: [phone]   Fax:  [phone]  Name: Chakara Bognar MRN:  [id number] Date of Birth: 28-Sep-1940

## 2020-04-05 ENCOUNTER — Encounter: Payer: Self-pay | Admitting: Physical Therapy

## 2020-04-05 ENCOUNTER — Other Ambulatory Visit: Payer: Self-pay

## 2020-04-05 ENCOUNTER — Ambulatory Visit: Payer: HMO | Admitting: Physical Therapy

## 2020-04-05 DIAGNOSIS — N3944 Nocturnal enuresis: Secondary | ICD-10-CM | POA: Diagnosis not present

## 2020-04-05 DIAGNOSIS — M62830 Muscle spasm of back: Secondary | ICD-10-CM

## 2020-04-05 DIAGNOSIS — M6281 Muscle weakness (generalized): Secondary | ICD-10-CM

## 2020-04-05 DIAGNOSIS — R293 Abnormal posture: Secondary | ICD-10-CM

## 2020-04-05 DIAGNOSIS — G8929 Other chronic pain: Secondary | ICD-10-CM

## 2020-04-05 DIAGNOSIS — M545 Low back pain, unspecified: Secondary | ICD-10-CM

## 2020-04-05 DIAGNOSIS — R262 Difficulty in walking, not elsewhere classified: Secondary | ICD-10-CM

## 2020-04-05 DIAGNOSIS — N3942 Incontinence without sensory awareness: Secondary | ICD-10-CM | POA: Diagnosis not present

## 2020-04-05 NOTE — Therapy (Signed)
[place], [place], [address] Phone: [phone]   Fax:  [phone]  Physical Therapy Treatment  Patient Details  Name: [name] MRN: [id number] Date of Birth: June 14, 1941 Referring Provider (PT): Zenia Resides, MD   Encounter Date: 04/05/2020   PT End of Session - 04/05/20 1324    Visit Number 3    Number of Visits 16    PT Start Time 1325    PT Stop Time 1405    PT Time Calculation (min) 40 min    Activity Tolerance Patient tolerated treatment well    Behavior During Therapy [facility] for tasks assessed/performed           Past Medical History:  Diagnosis Date  . Asthma   . Depression   . Diabetes mellitus without complication ([place])   . History of surgery on arm    right arm ( plate and screws)  . Hypertension   . Macular degeneration   . Migraine    without aura  . Urinary incontinence     Past Surgical History:  Procedure Laterality Date  . ABDOMINAL HYSTERECTOMY     ?Lt. ovary remains  . APPENDECTOMY    . Arm surgery    . BREAST BIOPSY Right 10/10/2009  . BREAST CYST ASPIRATION  12/20/2013  . BREAST EXCISIONAL BIOPSY Left 1998  . BREAST EXCISIONAL BIOPSY Right 1993    There were no vitals filed for this visit.   Subjective Assessment - 04/05/20 1329    Subjective Pt states that her back has been feeling stiff. Pt reports she's tried out the exercises at home and doesn't note any issues. Pt states that left side of her neck still hurts when she looks left.    Limitations Walking;Lifting;Standing;House hold activities    How long can you stand comfortably? 10-15 minutes    How long can you walk comfortably? 100'    Diagnostic tests XR: arthritis & DDD    Patient Stated Goals 50% decrease in pain    Currently in Pain? Yes    Pain Score 8     Pain Location Neck    Pain Descriptors / Indicators Tightness    Pain Type Chronic pain    Pain Onset More than a month ago                              [facility] Adult PT Treatment/Exercise - 04/05/20 0001      Neck Exercises: Standing   Other Standing Exercises Row 2x10 with green tband, shoulder extension 2x10 with green tband    Other Standing Exercises Serratus anterior x10 with green tband      Neck Exercises: Seated   Other Seated Exercise Shoulder rolls x10      Neck Exercises: Supine   Neck Retraction 10 reps;3 secs    Cervical Rotation 5 reps;Both   with neck retraction   Other Supine Exercise I's, T's 2x10      Lumbar Exercises: Aerobic   Nustep L4 x 6 min      Manual Therapy   Manual Therapy Soft tissue mobilization    Soft tissue mobilization cervical & thoracic paraspinals, L upper trap, scalene      Neck Exercises: Stretches   Upper Trapezius Stretch Right;Left;30 seconds                    PT Short Term Goals - 02/20/20 1643  PT SHORT TERM GOAL #1   Title Pt will be independent with initial HEP    Time 3    Period Weeks    Status New    Target Date 03/12/20             PT Long Term Goals - 02/20/20 1644      PT LONG TERM GOAL #1   Title Pt will be able to tolerate standing for the full time it takes to make dinner (~30-45 min)    Baseline Requires 2-3 sitting breaks    Time 8    Period Weeks    Status New    Target Date 04/16/20      PT LONG TERM GOAL #2   Title Pt will be able to tolerate ambulating at least 200' for community tasks    Baseline Unable to amb more than 100'    Time 8    Period Weeks    Status New    Target Date 04/16/20      PT LONG TERM GOAL #3   Title Pt will be able to carry <5# of groceries to assist her husband    Baseline Unable    Time 8    Period Weeks    Status New    Target Date 04/16/20      PT LONG TERM GOAL #4   Title Pt will report 50% decrease in overall pain.    Baseline 10/10 pain at worst, 8/10 pain at rest    Time 8    Period Weeks    Status New    Target Date 04/16/20      PT LONG TERM GOAL  #5   Title Pt will improve FOTO score from 68% to 51% limitation    Time 8    Period Weeks    Status New    Target Date 04/16/20                 Plan - 04/05/20 1416    Clinical Impression Statement Treatment focused on pt's upper back pain and neck tightness. Tightness addressed with manual therapy and education on self massage with tennis ball. Progressed thoracic and periscapular strengthening this session. Pt tolerated well; some modifications provided due to pt with problems in her R RTC.    Personal Factors and Comorbidities Age;Comorbidity 1;Time since onset of injury/illness/exacerbation;Comorbidity 2;Past/Current Experience    Comorbidities diabetes, arthritis, previous R shoulder injury    Examination-Activity Limitations Bed Mobility;Caring for Others;Locomotion Level;Lift;Squat;Stairs;Stand;Transfers    Examination-Participation Restrictions Cleaning;Community Activity;Shop    Stability/Clinical Decision Making Evolving/Moderate complexity    Rehab Potential Good    PT Frequency 2x / week    PT Duration 8 weeks    PT Treatment/Interventions ADLs/Self Care Home Management;Cryotherapy;Electrical Stimulation;Iontophoresis 4mg /ml Dexamethasone;Moist Heat;Traction    PT Next Visit Plan Assess response to HEP. Initiate core strengthening. Continue to stretch hip flexors/hamstring and progress pt into trunk extension. Consider manual therapy and hip mobs. Continue scapular and neck retraction.    PT Home Exercise Plan Access Code: [email or web address]    Consulted and Agree with Plan of Care Patient           Patient will benefit from skilled therapeutic intervention in order to improve the following deficits and impairments:  Abnormal gait, Decreased balance, Decreased mobility, Difficulty walking, Hypomobility, Increased muscle spasms, Decreased range of motion, Improper body mechanics, Decreased activity tolerance, Decreased strength, Impaired flexibility, Increased fascial  restricitons, Postural dysfunction, Pain  Visit Diagnosis: Chronic bilateral  low back pain without sciatica  Muscle spasm of back  Muscle weakness (generalized)  Abnormal posture  Difficulty in walking, not elsewhere classified     Problem List Patient Active Problem List   Diagnosis Date Noted  . Scalp irritation 01/18/2020  . Financial difficulties 11/16/2019  . Osteoarthritis of left knee 06/13/2019  . Rotator cuff syndrome of right shoulder 01/21/2019  . Bladder prolapse, female, acquired 08/16/2018  . Urinary incontinence 07/05/2018  . Constipation 07/05/2018  . Frequent falls 12/24/2017  . Rotator cuff syndrome, left 06/19/2017  . Adjustment reaction with anxiety and depression 03/26/2017  . Ruptured silicone breast implant 12/08/2016  . Iron deficiency anemia due to chronic blood loss 10/31/2015  . Chronic right-sided thoracic back pain 07/26/2015  . Spinal stenosis of lumbar region 07/10/2015  . Abdominal aortic atherosclerosis ([place]) 07/10/2015  . Fatigue 04/04/2011  . Sleep apnea 05/22/2010  . PERIPHERAL NEUROPATHY 02/06/2010  . PULMONARY NODULE, SOLITARY 07/20/2009  . Irritable bowel syndrome 11/12/2007  . Non-insulin dependent type 2 diabetes mellitus ([place]) 10/08/2006  . HYPERCHOLESTEROLEMIA 10/08/2006  . HYPERTRIGLYCERIDEMIA 10/08/2006  . Migraine headache 10/08/2006  . CARPAL TUNNEL SYNDROME 10/08/2006  . HYPERTENSION, BENIGN SYSTEMIC 10/08/2006  . ASTHMA, PERSISTENT, MODERATE 10/08/2006  . REFLUX ESOPHAGITIS 10/08/2006  . Eczema 10/08/2006  . Psoriasis 10/08/2006  . Osteoarthritis of spine 10/08/2006  . VERTIGO NOS OR DIZZINESS 10/08/2006    [facility] [address] PT, DPT 04/05/2020, 2:35 PM  [facility] [address] [place], [place], [address] Phone: [phone]   Fax:  [phone]  Name: [name] MRN: [id number] Date of Birth: Nov 20, 1940

## 2020-04-10 ENCOUNTER — Telehealth: Payer: Self-pay

## 2020-04-10 NOTE — Telephone Encounter (Signed)
Faxed refill request to [facility] for pt's Ozempic RX today.  Allow 10-14 business days for processing/shipping.

## 2020-04-11 ENCOUNTER — Ambulatory Visit: Payer: HMO | Admitting: Physical Therapy

## 2020-04-23 DIAGNOSIS — H35371 Puckering of macula, right eye: Secondary | ICD-10-CM | POA: Diagnosis not present

## 2020-04-23 DIAGNOSIS — H353132 Nonexudative age-related macular degeneration, bilateral, intermediate dry stage: Secondary | ICD-10-CM | POA: Diagnosis not present

## 2020-04-23 DIAGNOSIS — E113312 Type 2 diabetes mellitus with moderate nonproliferative diabetic retinopathy with macular edema, left eye: Secondary | ICD-10-CM | POA: Diagnosis not present

## 2020-04-23 DIAGNOSIS — E113391 Type 2 diabetes mellitus with moderate nonproliferative diabetic retinopathy without macular edema, right eye: Secondary | ICD-10-CM | POA: Diagnosis not present

## 2020-04-25 ENCOUNTER — Encounter: Payer: HMO | Admitting: Physical Therapy

## 2020-04-26 ENCOUNTER — Ambulatory Visit: Payer: HMO | Admitting: Physical Therapy

## 2020-05-01 DIAGNOSIS — L570 Actinic keratosis: Secondary | ICD-10-CM | POA: Diagnosis not present

## 2020-05-01 DIAGNOSIS — R202 Paresthesia of skin: Secondary | ICD-10-CM | POA: Diagnosis not present

## 2020-05-01 DIAGNOSIS — L820 Inflamed seborrheic keratosis: Secondary | ICD-10-CM | POA: Diagnosis not present

## 2020-05-01 DIAGNOSIS — L738 Other specified follicular disorders: Secondary | ICD-10-CM | POA: Diagnosis not present

## 2020-05-01 DIAGNOSIS — L2084 Intrinsic (allergic) eczema: Secondary | ICD-10-CM | POA: Diagnosis not present

## 2020-05-02 ENCOUNTER — Other Ambulatory Visit: Payer: Self-pay | Admitting: Family Medicine

## 2020-05-02 DIAGNOSIS — F4323 Adjustment disorder with mixed anxiety and depressed mood: Secondary | ICD-10-CM

## 2020-05-09 ENCOUNTER — Other Ambulatory Visit: Payer: Self-pay

## 2020-05-09 ENCOUNTER — Encounter: Payer: Self-pay | Admitting: Physical Therapy

## 2020-05-09 ENCOUNTER — Ambulatory Visit: Payer: HMO | Attending: Family Medicine | Admitting: Physical Therapy

## 2020-05-09 DIAGNOSIS — M6281 Muscle weakness (generalized): Secondary | ICD-10-CM | POA: Diagnosis not present

## 2020-05-09 DIAGNOSIS — R293 Abnormal posture: Secondary | ICD-10-CM | POA: Diagnosis not present

## 2020-05-09 DIAGNOSIS — M62830 Muscle spasm of back: Secondary | ICD-10-CM

## 2020-05-09 DIAGNOSIS — G8929 Other chronic pain: Secondary | ICD-10-CM | POA: Insufficient documentation

## 2020-05-09 DIAGNOSIS — R262 Difficulty in walking, not elsewhere classified: Secondary | ICD-10-CM | POA: Insufficient documentation

## 2020-05-09 DIAGNOSIS — M545 Low back pain, unspecified: Secondary | ICD-10-CM

## 2020-05-09 NOTE — Therapy (Addendum)
[place], [place], [address] Phone: [phone]   Fax:  [phone]  Physical Therapy Treatment and Re-Certification / discharge  Patient Details  Name: [name] MRN: [id number] Date of Birth: 18-Apr-1941 Referring Provider (PT): Zenia Resides, MD   Encounter Date: 05/09/2020   PT End of Session - 05/09/20 1657    Visit Number 4    Number of Visits 16    PT Start Time 1630    PT Stop Time 1710    PT Time Calculation (min) 40 min    Activity Tolerance Patient tolerated treatment well    Behavior During Therapy [facility] for tasks assessed/performed           Past Medical History:  Diagnosis Date  . Asthma   . Depression   . Diabetes mellitus without complication ([place])   . History of surgery on arm    right arm ( plate and screws)  . Hypertension   . Macular degeneration   . Migraine    without aura  . Urinary incontinence     Past Surgical History:  Procedure Laterality Date  . ABDOMINAL HYSTERECTOMY     ?Lt. ovary remains  . APPENDECTOMY    . Arm surgery    . BREAST BIOPSY Right 10/10/2009  . BREAST CYST ASPIRATION  12/20/2013  . BREAST EXCISIONAL BIOPSY Left 1998  . BREAST EXCISIONAL BIOPSY Right 1993    There were no vitals filed for this visit.   Subjective Assessment - 05/09/20 1635    Subjective Pt reports that her back has been feeling better in general; however, today her back is feeling really bad. She attributes it to hours of shopping yesterday. Pt reports no issues with her neck/upper back. Pain is mostly in her R RTC and L knee. Pt states her PCP recommended she use a cane.    Limitations Walking;Lifting;Standing;House hold activities    How long can you stand comfortably? 10-15 minutes    How long can you walk comfortably? 100'    Diagnostic tests XR: arthritis & DDD    Patient Stated Goals 50% decrease in pain    Currently in Pain? Yes    Pain Score 10-Worst pain  ever    Pain Location Back    Pain Orientation Lower;Right;Left    Pain Onset More than a month ago              [facility] PT Assessment - 05/09/20 0001      Observation/Other Assessments   Focus on Therapeutic Outcomes (FOTO)  66%                         [facility] Adult PT Treatment/Exercise - 05/09/20 0001      Lumbar Exercises: Stretches   Single Knee to Chest Stretch Right;Left;30 seconds    Figure 4 Stretch 30 seconds    Other Lumbar Stretch Exercise forward flexion with pball x10 with 2 sec hold; pball with lateral flexion x10 with 2 sec      Lumbar Exercises: Supine   Ab Set 10 reps;2 seconds    AB Set Limitations forward, L&R with black pball    Bent Knee Raise 20 reps    Other Supine Lumbar Exercises LTR x10      Manual Therapy   Soft tissue mobilization self massage with theracane                  PT Education -  05/09/20 1725    Education Details Discussed using theracane as an option for self trigger point release. Discussed PCP's advice for using a cane for safety (told pt to bring it in next visit if she got one).    Person(s) Educated Patient    Methods Explanation;Demonstration;Verbal cues    Comprehension Verbalized understanding;Returned demonstration;Verbal cues required;Tactile cues required            PT Short Term Goals - 02/20/20 1643      PT SHORT TERM GOAL #1   Title Pt will be independent with initial HEP    Time 3    Period Weeks    Status New    Target Date 03/12/20             PT Long Term Goals - 05/09/20 1651      PT LONG TERM GOAL #1   Title Pt will be able to tolerate standing for the full time it takes to make dinner (~30-45 min)    Baseline Requires 2-3 sitting breaks    Time 8    Period Weeks    Status Achieved      PT LONG TERM GOAL #2   Title Pt will be able to tolerate ambulating at least 200' for community tasks    Baseline Was able to shop for hours in the community    Time 8    Period Weeks     Status Achieved      PT LONG TERM GOAL #3   Title Pt will be able to carry <5# of groceries to assist her husband    Baseline Unable    Time 6    Period Weeks    Status Revised    Target Date 06/20/20      PT LONG TERM GOAL #4   Title Pt will report 50% decrease in overall pain.    Baseline Reports 20-30% improvement on 9/29    Time 6    Period Weeks    Status Revised    Target Date 06/20/20      PT LONG TERM GOAL #5   Title Pt will improve FOTO score from 68% to 51% limitation    Baseline 66% FOTO score on 8/29    Time 6    Period Weeks    Status Revised    Target Date 06/20/20                 Plan - 05/09/20 1701    Clinical Impression Statement Pt last seen 8/26 and due for re-cert. Pt with increased back pain today due to hours of shopping yesterday. Treatment thus focused on pt education on how to manage this at home with gentle stretches, ROM, and core stabilization exercises. Pt is demonstrating slow gains in PT but has not been able to come on a consistent basis. Pt is slowly meeting meeting goals but will still benefit from continued PT for strengthening to make household chores easier. FOTO score did improve and pt has reported improvements in how long she is able to stand to make dinner. Educated pt on using theracane to help manage her neck and upper back tightness.    Personal Factors and Comorbidities Age;Comorbidity 1;Time since onset of injury/illness/exacerbation;Comorbidity 2;Past/Current Experience    Comorbidities diabetes, arthritis, previous R shoulder injury    Examination-Activity Limitations Bed Mobility;Caring for Others;Locomotion Level;Lift;Squat;Stairs;Stand;Transfers    Examination-Participation Restrictions Cleaning;Community Activity;Shop    Stability/Clinical Decision Making Evolving/Moderate complexity    Rehab Potential Good  PT Frequency 1x / week    PT Duration 6 weeks    PT Treatment/Interventions ADLs/Self Care Home  Management;Cryotherapy;Electrical Stimulation;Iontophoresis 39m/ml Dexamethasone;Moist Heat;Traction    PT Next Visit Plan Assess response to HEP. Continue to progress core/bilat LE strengthening. Continue to stretch hip flexors/hamstring and progress pt into trunk extension. Consider manual therapy and hip mobs. Continue scapular and neck retraction.    PT Home Exercise Plan Access Code: [email or web address]   Consulted and Agree with Plan of Care Patient           Patient will benefit from skilled therapeutic intervention in order to improve the following deficits and impairments:  Abnormal gait, Decreased balance, Decreased mobility, Difficulty walking, Hypomobility, Increased muscle spasms, Decreased range of motion, Improper body mechanics, Decreased activity tolerance, Decreased strength, Impaired flexibility, Increased fascial restricitons, Postural dysfunction, Pain  Visit Diagnosis: Chronic bilateral low back pain without sciatica - Plan: PT plan of care cert/re-cert  Muscle spasm of back - Plan: PT plan of care cert/re-cert  Muscle weakness (generalized) - Plan: PT plan of care cert/re-cert  Abnormal posture - Plan: PT plan of care cert/re-cert  Difficulty in walking, not elsewhere classified - Plan: PT plan of care cert/re-cert     Problem List Patient Active Problem List   Diagnosis Date Noted  . Scalp irritation 01/18/2020  . Financial difficulties 11/16/2019  . Osteoarthritis of left knee 06/13/2019  . Rotator cuff syndrome of right shoulder 01/21/2019  . Bladder prolapse, female, acquired 08/16/2018  . Urinary incontinence 07/05/2018  . Constipation 07/05/2018  . Frequent falls 12/24/2017  . Rotator cuff syndrome, left 06/19/2017  . Adjustment reaction with anxiety and depression 03/26/2017  . Ruptured silicone breast implant 12/08/2016  . Iron deficiency anemia due to chronic blood loss 10/31/2015  . Chronic right-sided thoracic back pain 07/26/2015  . Spinal stenosis of  lumbar region 07/10/2015  . Abdominal aortic atherosclerosis ([place] 07/10/2015  . Fatigue 04/04/2011  . Sleep apnea 05/22/2010  . PERIPHERAL NEUROPATHY 02/06/2010  . PULMONARY NODULE, SOLITARY 07/20/2009  . Irritable bowel syndrome 11/12/2007  . Non-insulin dependent type 2 diabetes mellitus ([place] 10/08/2006  . HYPERCHOLESTEROLEMIA 10/08/2006  . HYPERTRIGLYCERIDEMIA 10/08/2006  . Migraine headache 10/08/2006  . CARPAL TUNNEL SYNDROME 10/08/2006  . HYPERTENSION, BENIGN SYSTEMIC 10/08/2006  . ASTHMA, PERSISTENT, MODERATE 10/08/2006  . REFLUX ESOPHAGITIS 10/08/2006  . Eczema 10/08/2006  . Psoriasis 10/08/2006  . Osteoarthritis of spine 10/08/2006  . VERTIGO NOS OR DIZZINESS 10/08/2006    [facility] CenterApril Ma L Jillian Pianka PT, DPT 05/09/2020, 5:33 PM  [facility] [address]Phone: [phone]  Fax:  [phone] Name: Debra BehlMRN: [id number] of Birth: 9May 13, 1942     PHYSICAL THERAPY DISCHARGE SUMMARY  Visits from Start of Care: 4  Current functional level related to goals / functional outcomes: See goals   Remaining deficits: Currently unknown   Education / Equipment: HEP  Plan: Patient agrees to discharge.  Patient goals were not met. Patient is being discharged due to not returning since the last visit.  ?????        Kristoffer Leamon PT, DPT, LAT, ATC  10/11/20  10:55 AM

## 2020-05-10 ENCOUNTER — Ambulatory Visit (INDEPENDENT_AMBULATORY_CARE_PROVIDER_SITE_OTHER): Payer: HMO | Admitting: Family Medicine

## 2020-05-10 ENCOUNTER — Encounter: Payer: Self-pay | Admitting: Family Medicine

## 2020-05-10 VITALS — BP 118/62 | HR 90 | Wt 188.2 lb

## 2020-05-10 DIAGNOSIS — Z599 Problem related to housing and economic circumstances, unspecified: Secondary | ICD-10-CM

## 2020-05-10 DIAGNOSIS — E119 Type 2 diabetes mellitus without complications: Secondary | ICD-10-CM | POA: Diagnosis not present

## 2020-05-10 DIAGNOSIS — M75101 Unspecified rotator cuff tear or rupture of right shoulder, not specified as traumatic: Secondary | ICD-10-CM

## 2020-05-10 DIAGNOSIS — M1712 Unilateral primary osteoarthritis, left knee: Secondary | ICD-10-CM

## 2020-05-10 DIAGNOSIS — H938X3 Other specified disorders of ear, bilateral: Secondary | ICD-10-CM | POA: Diagnosis not present

## 2020-05-10 DIAGNOSIS — Z598 Other problems related to housing and economic circumstances: Secondary | ICD-10-CM | POA: Diagnosis not present

## 2020-05-10 LAB — POCT GLYCOSYLATED HEMOGLOBIN (HGB A1C): HbA1c, POC (controlled diabetic range): 7.4 % — AB (ref 0.0–7.0)

## 2020-05-10 NOTE — Patient Instructions (Addendum)
I hope the steroid knee injection helps. Talk with the orthopedist about your shoulder.  It is a mess. Not only will the orthopedist call, a social worker should call to talk about qualifying for help. The diabetes is less well controled.  I hope that the knee injection will help you be more active. See me in one month.  Likely blood work and possible med changes at that time.  I am glad you are on top of the COVID booster and flu shots. So

## 2020-05-11 ENCOUNTER — Encounter: Payer: Self-pay | Admitting: Family Medicine

## 2020-05-11 DIAGNOSIS — H938X3 Other specified disorders of ear, bilateral: Secondary | ICD-10-CM | POA: Insufficient documentation

## 2020-05-11 DIAGNOSIS — L299 Pruritus, unspecified: Secondary | ICD-10-CM | POA: Insufficient documentation

## 2020-05-11 NOTE — Assessment & Plan Note (Signed)
Much more than simple rotator cuff.  While I am tempted to temporize with a steroid injection, the loose foreign body makes me concerned that she will need more aggressive intervention.  Refer to ortho.

## 2020-05-11 NOTE — Assessment & Plan Note (Signed)
No evidence of problem.  Suggest DC q tips.

## 2020-05-11 NOTE — Assessment & Plan Note (Signed)
Hopefully the knee injection will allow her to be more active.

## 2020-05-11 NOTE — Progress Notes (Signed)
    SUBJECTIVE:   CHIEF COMPLAINT / HPI:   FU multiple issues 1. Left knee pain.  Chronic and worsening.  Made more cumbersome with problem #2.  Firm diagnosis of osteoarthritis.  Had one previous knee injection with excellent results.  She knows it is a temporizing measure.  She is also concerned with swelling behind left knee. 2. Right shoulder pain.  Acute on chronic.  Much worse and causing significant limitations.  Reviewed Xrays.  Sig arthritis and loose FB in joint.   3. Bilateral ear fullness.  Wants checked.  Uses q tips. No fever or pain.  Perhaps hearing is slightly muffled.   4. DM.  A1C crept up.  She has been less active with know and shoulder 4. Also has known cervical arthritis.  C/O some numbness and discomfort around elbow.    OBJECTIVE:   BP 118/62   Pulse 90   Wt 188 lb 3.2 oz (85.4 kg)   SpO2 98%   BMI 35.56 kg/m   TMs normal and canals clear - perhaps mild irritation of canal skin. Rt shoulder: painful and limited ROM Lungs clear Cardiac RRR without m or g Left knee.  Small effusion.  Fullness in the back suggestive of a Baker's cyst  Informed consent.  Left knee prepped with betadine and injected with 80 mg depo medrol    ASSESSMENT/PLAN:   Rotator cuff syndrome of right shoulder Much more than simple rotator cuff.  While I am tempted to temporize with a steroid injection, the loose foreign body makes me concerned that she will need more aggressive intervention.  Refer to ortho.  Osteoarthritis of left knee Hopefully the knee injection will allow her to be more active.  Non-insulin dependent type 2 diabetes mellitus (HCC) Mild worsening of control, likely due to her being less active with her ortho issues.No change in meds  Ear fullness, bilateral No evidence of problem.  Suggest DC q tips.  Financial difficulties Especially with her shoulder problem, she cannot do many of her household chores.  Wonders if she qualifies for some sort of assistance  (PCS)  Will consult CCM to see.       Zenia Resides, MD [place]

## 2020-05-11 NOTE — Assessment & Plan Note (Signed)
Especially with her shoulder problem, she cannot do many of her household chores.  Wonders if she qualifies for some sort of assistance (PCS)  Will consult CCM to see.

## 2020-05-11 NOTE — Assessment & Plan Note (Signed)
Mild worsening of control, likely due to her being less active with her ortho issues.No change in meds

## 2020-05-14 ENCOUNTER — Other Ambulatory Visit: Payer: Self-pay | Admitting: Family Medicine

## 2020-05-14 DIAGNOSIS — N760 Acute vaginitis: Secondary | ICD-10-CM

## 2020-05-18 ENCOUNTER — Telehealth: Payer: Self-pay | Admitting: Family Medicine

## 2020-05-18 NOTE — Telephone Encounter (Signed)
   SF 05/18/2020   Name: [name]   MRN: [id number]   DOB: [birth year]   AGE: 79 y.o.   GENDER: female   PCP Hensel, Jamal Collin, MD.   Spoke with [name] today regarding referral. Patient stated that she just needs someone to come to her home maybe once a week to help clean the bathrooms, kitchen, and dust. Care Guide informed patient that she will research different resources and give her a call back in a week to let her know what organizations might be able to assist her. Patient stated understanding.   [place], Care Management Phone: [phone] Email: [email or web address]

## 2020-05-24 ENCOUNTER — Ambulatory Visit (INDEPENDENT_AMBULATORY_CARE_PROVIDER_SITE_OTHER): Payer: HMO

## 2020-05-24 ENCOUNTER — Encounter: Payer: Self-pay | Admitting: Orthopaedic Surgery

## 2020-05-24 ENCOUNTER — Ambulatory Visit (INDEPENDENT_AMBULATORY_CARE_PROVIDER_SITE_OTHER): Payer: HMO | Admitting: Orthopaedic Surgery

## 2020-05-24 ENCOUNTER — Other Ambulatory Visit: Payer: Self-pay

## 2020-05-24 DIAGNOSIS — M25511 Pain in right shoulder: Secondary | ICD-10-CM | POA: Diagnosis not present

## 2020-05-24 MED ORDER — LIDOCAINE HCL 1 % IJ SOLN
3.0000 mL | INTRAMUSCULAR | Status: AC | PRN
  Administered 2020-05-24: 3 mL

## 2020-05-24 MED ORDER — METHYLPREDNISOLONE ACETATE 40 MG/ML IJ SUSP
40.0000 mg | INTRAMUSCULAR | Status: AC | PRN
  Administered 2020-05-24: 40 mg via INTRA_ARTICULAR

## 2020-05-24 NOTE — Progress Notes (Signed)
Office Visit Note   Patient: [name]           Date of Birth: 1940-10-12           MRN: [id number] Visit Date: 05/24/2020              Requested by: Zenia Resides, MD [address] [place],  [place] [address] PCP: Zenia Resides, MD   Assessment & Plan: Visit Diagnoses:  1. Right shoulder pain, unspecified chronicity     Plan: At this point I would first recommend outpatient physical therapy on her right shoulder to work on range of motion and strengthening and any modalities that can help decrease her shoulder pain.  Also recommended a steroid injection in the right shoulder subacromial space.  She agreed to this and tolerated it well.  She understands she will watch her blood glucose closely.  All questions and concerns were answered and addressed.  We will see her back in 4 weeks to see how she is doing overall.  Follow-Up Instructions: Return in about 4 weeks (around 06/21/2020).   Orders:  Orders Placed This Encounter  Procedures  . Large Joint Inj  . XR Shoulder Right   No orders of the defined types were placed in this encounter.     Procedures: Large Joint Inj: R subacromial bursa on 05/24/2020 3:47 PM Indications: pain and diagnostic evaluation Details: 22 G 1.5 in needle  Arthrogram: No  Medications: 3 mL lidocaine 1 %; 40 mg methylPREDNISolone acetate 40 MG/ML Outcome: tolerated well, no immediate complications Procedure, treatment alternatives, risks and benefits explained, specific risks discussed. Consent was given by the patient. Immediately prior to procedure a time out was called to verify the correct patient, procedure, equipment, support staff and site/side marked as required. Patient was prepped and draped in the usual sterile fashion.       Clinical Data: No additional findings.   Subjective: Chief Complaint  Patient presents with  . Right Shoulder - Pain  The patient is a pleasant 79 year old diabetic female who injured  her right shoulder about a year and a half ago after hard mechanical fall.  She said the shoulder was very sore after the fall and she just try to work through the pain.  Has gotten worse she cannot lift her shoulder above her head or reach behind her well without issues with the shoulder.  She does report weakness and pain.  She reports good diabetic control but says steroid injections do bump her sugars up occasionally.  She denies any acute changes otherwise in her medical status.  She does have occasional neck pain and left shoulder pain.  The right shoulder is less more problematic for her.  HPI  Review of Systems She currently denies a headache, chest pain, shortness of breath, fever, chills, nausea, vomiting  Objective: Vital Signs: There were no vitals taken for this visit.  Physical Exam She is alert and oriented x3 and in no acute distress Ortho Exam  examination of her right shoulder shows weakness with external rotation as well as abduction.  There is also positive liftoff.  Her internal rotation with adduction is limited.  She does use more of her deltoids to abduct her shoulder.  It is clinically well located. Specialty Comments:  No specialty comments available.  Imaging: XR Shoulder Right  Result Date: 05/24/2020 3 views of the right shoulder show no acute findings.  The shoulder is well located.    PMFS History: Patient Active  Problem List   Diagnosis Date Noted  . Ear fullness, bilateral 05/11/2020  . Financial difficulties 11/16/2019  . Osteoarthritis of left knee 06/13/2019  . Rotator cuff syndrome of right shoulder 01/21/2019  . Bladder prolapse, female, acquired 08/16/2018  . Urinary incontinence 07/05/2018  . Frequent falls 12/24/2017  . Rotator cuff syndrome, left 06/19/2017  . Adjustment reaction with anxiety and depression 03/26/2017  . Ruptured silicone breast implant 12/08/2016  . Iron deficiency anemia due to chronic blood loss 10/31/2015  . Chronic  right-sided thoracic back pain 07/26/2015  . Spinal stenosis of lumbar region 07/10/2015  . Abdominal aortic atherosclerosis ([place]) 07/10/2015  . Sleep apnea 05/22/2010  . PERIPHERAL NEUROPATHY 02/06/2010  . PULMONARY NODULE, SOLITARY 07/20/2009  . Irritable bowel syndrome 11/12/2007  . Non-insulin dependent type 2 diabetes mellitus ([place]) 10/08/2006  . HYPERCHOLESTEROLEMIA 10/08/2006  . HYPERTRIGLYCERIDEMIA 10/08/2006  . Migraine headache 10/08/2006  . CARPAL TUNNEL SYNDROME 10/08/2006  . HYPERTENSION, BENIGN SYSTEMIC 10/08/2006  . ASTHMA, PERSISTENT, MODERATE 10/08/2006  . REFLUX ESOPHAGITIS 10/08/2006  . Eczema 10/08/2006  . Psoriasis 10/08/2006  . Osteoarthritis of spine 10/08/2006  . VERTIGO NOS OR DIZZINESS 10/08/2006   Past Medical History:  Diagnosis Date  . Asthma   . Depression   . Diabetes mellitus without complication ([place])   . History of surgery on arm    right arm ( plate and screws)  . Hypertension   . Macular degeneration   . Migraine    without aura  . Urinary incontinence     Family History  Problem Relation Age of Onset  . Hypertension Mother   . Hypertension Father   . Hearing loss Sister   . Heart attack Sister 57       s/p 4 bypasses   . Stroke Brother        21  . Breast cancer Maternal Aunt   . Ovarian cancer Maternal Aunt     Past Surgical History:  Procedure Laterality Date  . ABDOMINAL HYSTERECTOMY     ?Lt. ovary remains  . APPENDECTOMY    . Arm surgery    . BREAST BIOPSY Right 10/10/2009  . BREAST CYST ASPIRATION  12/20/2013  . BREAST EXCISIONAL BIOPSY Left 1998  . BREAST EXCISIONAL BIOPSY Right 1993   Social History   Occupational History  . Not on file  Tobacco Use  . Smoking status: Former Smoker    Packs/day: 2.00    Years: 33.00    Pack years: 66.00    Types: Cigarettes    Start date: 08/11/1957    Quit date: 04/04/1991    Years since quitting: 29.1  . Smokeless tobacco: Never Used  Vaping Use  . Vaping Use: Never used   Substance and Sexual Activity  . Alcohol use: No  . Drug use: No  . Sexual activity: Not Currently

## 2020-05-25 ENCOUNTER — Ambulatory Visit: Payer: HMO

## 2020-05-28 ENCOUNTER — Telehealth: Payer: Self-pay | Admitting: Family Medicine

## 2020-05-28 NOTE — Telephone Encounter (Signed)
   SF 05/28/2020   Name: [name]   MRN: [id number]   DOB: [birth year]   AGE: 79 y.o.   GENDER: female   PCP Hensel, Jamal Collin, MD.   Spoke with patient today regarding referral. Informed patient that Care Guide could not find a community resource for assistance with cleaning her home, but that she can call Jannetta Quint in [place] at [phone]. Explained to patient that there maybe a fee with this service. Patient asked if Care Guide could leave information on voicemail. Attempted to leave a message on voicemail, but voicemail did not pick up. Will send patient resource letter.   Follow up on: 05/28/2020  [place], Care Management Phone: [phone] Email: [email or web address]

## 2020-05-30 ENCOUNTER — Encounter: Payer: Self-pay | Admitting: Family Medicine

## 2020-05-31 ENCOUNTER — Ambulatory Visit: Payer: HMO | Admitting: Family Medicine

## 2020-06-04 ENCOUNTER — Other Ambulatory Visit: Payer: Self-pay

## 2020-06-04 ENCOUNTER — Encounter: Payer: Self-pay | Admitting: Family Medicine

## 2020-06-04 ENCOUNTER — Ambulatory Visit (INDEPENDENT_AMBULATORY_CARE_PROVIDER_SITE_OTHER): Payer: HMO | Admitting: Family Medicine

## 2020-06-04 DIAGNOSIS — F4323 Adjustment disorder with mixed anxiety and depressed mood: Secondary | ICD-10-CM

## 2020-06-04 DIAGNOSIS — E119 Type 2 diabetes mellitus without complications: Secondary | ICD-10-CM

## 2020-06-04 DIAGNOSIS — M1712 Unilateral primary osteoarthritis, left knee: Secondary | ICD-10-CM | POA: Diagnosis not present

## 2020-06-04 DIAGNOSIS — M75101 Unspecified rotator cuff tear or rupture of right shoulder, not specified as traumatic: Secondary | ICD-10-CM

## 2020-06-04 NOTE — Patient Instructions (Signed)
Go to physical therapy.  Ask about a walker.  I would be happy to give you a prescription. Try to figure out a way to be more active.  It would help in many ways.   No other changes for now.   See me in 2 months.

## 2020-06-04 NOTE — Telephone Encounter (Signed)
On 05/30/2020 sent e-mail to Meribeth Mattes to print and mail resource letter to patient. No additional needs at this time.   Closing referral pending any other needs of patient.

## 2020-06-05 ENCOUNTER — Other Ambulatory Visit: Payer: Self-pay | Admitting: Family Medicine

## 2020-06-05 DIAGNOSIS — N760 Acute vaginitis: Secondary | ICD-10-CM

## 2020-06-06 ENCOUNTER — Encounter: Payer: Self-pay | Admitting: Physical Therapy

## 2020-06-06 ENCOUNTER — Other Ambulatory Visit: Payer: Self-pay

## 2020-06-06 ENCOUNTER — Encounter: Payer: HMO | Admitting: Physical Therapy

## 2020-06-06 ENCOUNTER — Encounter: Payer: Self-pay | Admitting: Family Medicine

## 2020-06-06 ENCOUNTER — Ambulatory Visit: Payer: HMO | Attending: Family Medicine | Admitting: Physical Therapy

## 2020-06-06 DIAGNOSIS — G8929 Other chronic pain: Secondary | ICD-10-CM

## 2020-06-06 DIAGNOSIS — M62830 Muscle spasm of back: Secondary | ICD-10-CM | POA: Diagnosis not present

## 2020-06-06 DIAGNOSIS — M6281 Muscle weakness (generalized): Secondary | ICD-10-CM | POA: Insufficient documentation

## 2020-06-06 DIAGNOSIS — R262 Difficulty in walking, not elsewhere classified: Secondary | ICD-10-CM

## 2020-06-06 DIAGNOSIS — M25562 Pain in left knee: Secondary | ICD-10-CM | POA: Diagnosis not present

## 2020-06-06 DIAGNOSIS — M25511 Pain in right shoulder: Secondary | ICD-10-CM | POA: Diagnosis not present

## 2020-06-06 DIAGNOSIS — M545 Low back pain, unspecified: Secondary | ICD-10-CM

## 2020-06-06 DIAGNOSIS — R293 Abnormal posture: Secondary | ICD-10-CM | POA: Diagnosis not present

## 2020-06-06 NOTE — Assessment & Plan Note (Signed)
Followed by ortho.  Complicates conservative rx of cane for left knee.  Cannot use cane due to right shoulder pain. Agree with PT

## 2020-06-06 NOTE — Assessment & Plan Note (Signed)
Chronic and stable.  Suggest exercise and get out of house, which would also help other medical problems

## 2020-06-06 NOTE — Progress Notes (Signed)
    SUBJECTIVE:   CHIEF COMPLAINT / HPI:   FU multiple issues 1. Lt knee osteoarthritis.  Pain improved about 50% with knee injection.  The improvement lasted only 3-4 weeks.  Suboptimal response to steroid injection. 2. Right shoulder pain.  Seen by ortho.  Steroid injection.  He wants to try conservative measures before considering surgery.  He has ordered PT 3. DM.  Recent A1C OK as have been her home BS.  Fortunately, she does not think the two cortisone injections increased her BS much at all.  Weight is stable. 4. Chronic stress.  Husband is ill and largely homebound  They do fuss at one antoher.  She does not get out of the house much due to her orthopedic conditions.   OBJECTIVE:   BP 104/60   Pulse 96   Ht 4\' 10"  (1.473 m)   Wt 186 lb 3.2 oz (84.5 kg)   SpO2 99%   BMI 38.92 kg/m   VS noted Affect and cognition normal Right shoulder painful arc Left knee, no effusion.  ASSESSMENT/PLAN:   No problem-specific Assessment & Plan notes found for this encounter.     Debra Resides, MD [place]

## 2020-06-06 NOTE — Assessment & Plan Note (Signed)
Remains stable despite steroid injection x 2.  No change in Rx.  Recommend increase activity.

## 2020-06-06 NOTE — Therapy (Signed)
[place]. [place], [place], [address] Phone: [phone]   Fax:  [phone]  Physical Therapy Evaluation  Patient Details  Name: [name] MRN: [id number] Date of Birth: 1940/08/16 Referring Provider (PT): Andria Frames and Ninfa Linden   Encounter Date: 06/06/2020   PT End of Session - 06/06/20 1515    Visit Number 5    PT Start Time 8099    PT Stop Time 1535    PT Time Calculation (min) 50 min    Activity Tolerance Patient limited by pain    Behavior During Therapy [facility] for tasks assessed/performed           Past Medical History:  Diagnosis Date   Asthma    Depression    Diabetes mellitus without complication ([place])    History of surgery on arm    right arm ( plate and screws)   Hypertension    Macular degeneration    Migraine    without aura   Urinary incontinence     Past Surgical History:  Procedure Laterality Date   ABDOMINAL HYSTERECTOMY     ?Lt. ovary remains   APPENDECTOMY     Arm surgery     BREAST BIOPSY Right 10/10/2009   BREAST CYST ASPIRATION  12/20/2013   BREAST EXCISIONAL BIOPSY Left 1998   BREAST EXCISIONAL BIOPSY Right 1993    There were no vitals filed for this visit.    Subjective Assessment - 06/06/20 1450    Subjective Patient is very sporadic in attendance, has been seen 3 visits over the past 3-4 months.  She reports that she has back, right shoulder, left knee pain.  as well as neck pain.  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  She saw Dr. Ninfa Linden who ordered PT for the right shoulder, She also saw Dr. Andria Frames who has ordered PT for the knee, back and neck.    Currently in Pain? Yes    Pain Score 8     Pain Location Shoulder   as well as low back pain   Pain Orientation Right    Pain Descriptors / Indicators Sharp    Pain Type Chronic pain    Aggravating Factors  any use, activity pain up to 10/10    Pain Relieving  Factors exercise have helped the low back    Effect of Pain on Daily Activities difficulty cooking, diffiuclty shopping              [facility] PT Assessment - 06/06/20 0001      Assessment   Medical Diagnosis LBP, left knee pain and right shoulder pain    Referring Provider (PT) Hensel and Ninfa Linden    Onset Date/Surgical Date 05/30/20      Prior Function   Leisure cares for her husband      ROM / Strength   AROM / PROM / Strength AROM;Strength      AROM   AROM Assessment Site Shoulder;Knee    Right/Left Shoulder Right    Right Shoulder Flexion 40 Degrees    Right Shoulder ABduction 30 Degrees    Right Shoulder Internal Rotation 40 Degrees    Right Shoulder External Rotation 10 Degrees    Right/Left Knee Left    Left Knee Extension 15   more pain   Left Knee Flexion 90      Strength   Overall Strength Comments really poor strength in the right shoulder, due to  pain    Strength Assessment Site Knee    Right/Left Knee Left    Left Knee Flexion 3+/5    Left Knee Extension 3/5      Palpation   Palpation comment very tender in the right shoulder, the upper traps, the cervical area, the thoracic area and the lumbar      Ambulation/Gait   Gait Comments no device, slow gait, tends to shuffle, she does limp on both legs      Standardized Balance Assessment   Standardized Balance Assessment Timed Up and Go Test      Timed Up and Go Test   Normal TUG (seconds) 33                      Objective measurements completed on examination: See above findings.       [facility] Adult PT Treatment/Exercise - 06/06/20 0001      Modalities   Modalities Electrical Stimulation;Moist Heat      Moist Heat Therapy   Number Minutes Moist Heat 13 Minutes    Moist Heat Location Shoulder;Lumbar Spine      Electrical Stimulation   Electrical Stimulation Location right shoulder/lumbar    Electrical Stimulation Action premod    Electrical Stimulation Parameters sitting     Electrical Stimulation Goals Pain                    PT Short Term Goals - 02/20/20 1643      PT SHORT TERM GOAL #1   Title Pt will be independent with initial HEP    Time 3    Period Weeks    Status New    Target Date 03/12/20             PT Long Term Goals - 06/06/20 1521      PT LONG TERM GOAL #1   Title Pt will be able to tolerate standing for the full time it takes to make dinner (~30-45 min)    Time 8    Period Weeks    Status New      PT LONG TERM GOAL #2   Title Pt will be able to tolerate ambulating at least3200' for community tasks    Time 8    Period Weeks    Status New      PT LONG TERM GOAL #3   Title Pt will be able to carry <5# of groceries to assist her husband    Status New      PT LONG TERM GOAL #4   Title Pt will report 50% decrease in overall pain.    Status New      PT LONG TERM GOAL #5   Title decrease TUG time to 20 secnods    Time 8    Period Weeks    Status New                  Plan - 06/06/20 1515    Clinical Impression Statement Patient has been seen by our [facility] office for back and neck and knee issues, she has not been able to attend PT often, only been seen 3 times in the past 3-4 months.  She reports that she is the caregiver for her husband who has cancer and she has multiple appointments with her issues and her husbands, she recently saw Dr. Ninfa Linden who has referred her for her shoulder and Dr. Andria Frames who has referred her for shoulder, back and  knee.  She is very limited in right shoulder ROM mostly due to pain, her TUG was 33 seconds putting her at a high risk for falls.    Personal Factors and Comorbidities Age;Comorbidity 1;Time since onset of injury/illness/exacerbation;Comorbidity 2;Past/Current Experience    Comorbidities diabetes, arthritis, previous R shoulder injury    Examination-Activity Limitations Bed Mobility;Caring for Others;Locomotion Level;Lift;Squat;Stairs;Stand;Transfers     Examination-Participation Restrictions Cleaning;Community Activity;Shop    Stability/Clinical Decision Making Evolving/Moderate complexity    Clinical Decision Making Moderate    Rehab Potential Good    PT Frequency 1x / week    PT Duration 8 weeks    PT Treatment/Interventions ADLs/Self Care Home Management;Cryotherapy;Electrical Stimulation;Iontophoresis 4mg /ml Dexamethasone;Moist Heat;Traction;Gait training;Functional mobility training;Therapeutic activities;Therapeutic exercise;Balance training;Neuromuscular re-education;Manual techniques;Patient/family education    PT Next Visit Plan we will work on strnegth, function, ROM and function, also trying to decrease the pain    Consulted and Agree with Plan of Care Patient           Patient will benefit from skilled therapeutic intervention in order to improve the following deficits and impairments:  Abnormal gait, Decreased balance, Decreased mobility, Difficulty walking, Hypomobility, Increased muscle spasms, Decreased range of motion, Improper body mechanics, Decreased activity tolerance, Decreased strength, Impaired flexibility, Increased fascial restricitons, Postural dysfunction, Pain  Visit Diagnosis: Chronic bilateral low back pain without sciatica - Plan: PT plan of care cert/re-cert  Muscle spasm of back - Plan: PT plan of care cert/re-cert  Muscle weakness (generalized) - Plan: PT plan of care cert/re-cert  Abnormal posture - Plan: PT plan of care cert/re-cert  Difficulty in walking, not elsewhere classified - Plan: PT plan of care cert/re-cert  Acute pain of right shoulder - Plan: PT plan of care cert/re-cert  Chronic pain of left knee - Plan: PT plan of care cert/re-cert     Problem List Patient Active Problem List   Diagnosis Date Noted   Ear fullness, bilateral 05/11/2020   Financial difficulties 11/16/2019   Osteoarthritis of left knee 06/13/2019   Rotator cuff syndrome of right shoulder 01/21/2019    Bladder prolapse, female, acquired 08/16/2018   Urinary incontinence 07/05/2018   Frequent falls 12/24/2017   Adjustment reaction with anxiety and depression 14/78/2956   Ruptured silicone breast implant 12/08/2016   Iron deficiency anemia due to chronic blood loss 10/31/2015   Chronic right-sided thoracic back pain 07/26/2015   Spinal stenosis of lumbar region 07/10/2015   Abdominal aortic atherosclerosis ([place]) 07/10/2015   Sleep apnea 05/22/2010   PERIPHERAL NEUROPATHY 02/06/2010   PULMONARY NODULE, SOLITARY 07/20/2009   Irritable bowel syndrome 11/12/2007   Non-insulin dependent type 2 diabetes mellitus ([place]) 10/08/2006   HYPERCHOLESTEROLEMIA 10/08/2006   HYPERTRIGLYCERIDEMIA 10/08/2006   Migraine headache 10/08/2006   CARPAL TUNNEL SYNDROME 10/08/2006   HYPERTENSION, BENIGN SYSTEMIC 10/08/2006   ASTHMA, PERSISTENT, MODERATE 10/08/2006   REFLUX ESOPHAGITIS 10/08/2006   Eczema 10/08/2006   Psoriasis 10/08/2006   Osteoarthritis of spine 10/08/2006   VERTIGO NOS OR DIZZINESS 10/08/2006    Sumner Boast., PT 06/06/2020, 3:37 PM  [place]. [place], [place], [address] Phone: [phone]   Fax:  [phone]  Name: Alexsa Flaum MRN: [id number] Date of Birth: [birth year]

## 2020-06-06 NOTE — Assessment & Plan Note (Signed)
>>  ASSESSMENT AND PLAN FOR ADJUSTMENT REACTION WITH ANXIETY AND DEPRESSION WRITTEN ON 06/06/2020 12:28 PM BY HENSEL, ELSIE LABOR, MD  Chronic and stable.  Suggest exercise and get out of house, which would also help other medical problems

## 2020-06-06 NOTE — Assessment & Plan Note (Signed)
Suboptimal response to steroid injection.  Suggest she speak to PT about a walker used regularly.  I will be happy to write an Rx PT recommends.

## 2020-06-07 ENCOUNTER — Telehealth: Payer: Self-pay

## 2020-06-07 DIAGNOSIS — G8929 Other chronic pain: Secondary | ICD-10-CM

## 2020-06-07 MED ORDER — NORTRIPTYLINE HCL 50 MG PO CAPS: 50.0000 mg | ORAL_CAPSULE | Freq: Every day | ORAL | 3 refills | Status: DC

## 2020-06-07 NOTE — Telephone Encounter (Signed)
One per day is fine.  I changed it on her med list.

## 2020-06-07 NOTE — Telephone Encounter (Signed)
Received phone call from [place] at [facility] regarding patient's medication management. Patient reports that she is only taking one nortriptyline per day instead of the prescribed two. Per patient, she has discussed this with Dr. Andria Frames and he agrees to this regimen.   Patient also has not been taking rosuvastatin that was prescribed by Dr. Andria Frames in April 2021. After patient education, patient has agreed to take medication.   Please advise if nortriptyline should be once or twice daily.   To PCP  Talbot Grumbling, RN

## 2020-06-11 NOTE — Telephone Encounter (Signed)
Contaced Rhonda and made aware of below.   Talbot Grumbling, RN

## 2020-06-12 ENCOUNTER — Other Ambulatory Visit: Payer: Self-pay

## 2020-06-12 ENCOUNTER — Other Ambulatory Visit: Payer: Self-pay | Admitting: Family Medicine

## 2020-06-12 DIAGNOSIS — N760 Acute vaginitis: Secondary | ICD-10-CM

## 2020-06-12 NOTE — Telephone Encounter (Signed)
Patient LVM on nurse line requesting status of rx refill. Patient reports that pharmacist told her that medication was denied. Please advise if patient needs to be scheduled for follow up in clinic or if medication can be prescribed.   Thanks!  Talbot Grumbling, RN

## 2020-06-12 NOTE — Telephone Encounter (Signed)
Patient calls nurse line requesting another Diflucan pill. Patient reports she is on Jardiance and has had increased yeast infections. Diflucan was last refilled for 2 tabs on 10/26, patient took the last tab on 10/30. Patient reports she felt ok for a day or so, however this morning woke to thick white itchy discharge. Patient requesting another Diflucan. Please advise.

## 2020-06-13 ENCOUNTER — Other Ambulatory Visit: Payer: Self-pay

## 2020-06-13 ENCOUNTER — Ambulatory Visit: Payer: HMO | Attending: Family Medicine | Admitting: Physical Therapy

## 2020-06-13 ENCOUNTER — Encounter: Payer: Self-pay | Admitting: Physical Therapy

## 2020-06-13 DIAGNOSIS — M25562 Pain in left knee: Secondary | ICD-10-CM | POA: Diagnosis not present

## 2020-06-13 DIAGNOSIS — R262 Difficulty in walking, not elsewhere classified: Secondary | ICD-10-CM | POA: Insufficient documentation

## 2020-06-13 DIAGNOSIS — G8929 Other chronic pain: Secondary | ICD-10-CM | POA: Diagnosis not present

## 2020-06-13 DIAGNOSIS — R293 Abnormal posture: Secondary | ICD-10-CM | POA: Diagnosis not present

## 2020-06-13 DIAGNOSIS — M62830 Muscle spasm of back: Secondary | ICD-10-CM | POA: Diagnosis not present

## 2020-06-13 DIAGNOSIS — M6281 Muscle weakness (generalized): Secondary | ICD-10-CM | POA: Insufficient documentation

## 2020-06-13 DIAGNOSIS — M545 Low back pain, unspecified: Secondary | ICD-10-CM | POA: Diagnosis not present

## 2020-06-13 DIAGNOSIS — M25511 Pain in right shoulder: Secondary | ICD-10-CM | POA: Insufficient documentation

## 2020-06-13 NOTE — Telephone Encounter (Signed)
Called patient.  Explained frequent use of diflucan may cause liver problems.  Recommended OTC Monistat.  May need to consider DC jardiance if frequent yeast infections continue.

## 2020-06-13 NOTE — Therapy (Signed)
[place]. [place], [place], [address] Phone: [phone]   Fax:  [phone]  Physical Therapy Treatment  Patient Details  Name: [name] MRN: [id number] Date of Birth: [birth year] Referring Provider (PT): Andria Frames and Ninfa Linden   Encounter Date: 06/13/2020   PT End of Session - 06/13/20 1700    Visit Number 6    PT Start Time 2992    PT Stop Time 4268    PT Time Calculation (min) 53 min    Activity Tolerance Patient limited by pain    Behavior During Therapy [facility] for tasks assessed/performed           Past Medical History:  Diagnosis Date  . Asthma   . Depression   . Diabetes mellitus without complication ([place])   . History of surgery on arm    right arm ( plate and screws)  . Hypertension   . Macular degeneration   . Migraine    without aura  . Urinary incontinence     Past Surgical History:  Procedure Laterality Date  . ABDOMINAL HYSTERECTOMY     ?Lt. ovary remains  . APPENDECTOMY    . Arm surgery    . BREAST BIOPSY Right 10/10/2009  . BREAST CYST ASPIRATION  12/20/2013  . BREAST EXCISIONAL BIOPSY Left 1998  . BREAST EXCISIONAL BIOPSY Right 1993    There were no vitals filed for this visit.   Subjective Assessment - 06/13/20 1453    Subjective Patient reports that before she came over she almost dropped a sugar bowl and this really hurt her right shoulder, also back pain, reports a little relief with the modalitieis    Currently in Pain? Yes    Pain Score 8     Pain Location Shoulder   rates low back pain 10/10   Pain Orientation Right    Aggravating Factors  reaching for bowl                             [facility] Adult PT Treatment/Exercise - 06/13/20 0001      Neck Exercises: Seated   Neck Retraction 10 reps    Other Seated Exercise shrugs and retraction      Lumbar Exercises: Aerobic   Nustep level 3 x 6 minutes      Lumbar Exercises: Seated   Long Arc Quad  on Chair Both;2 sets;10 reps    LAQ on Chair Weights (lbs) 2    Other Seated Lumbar Exercises red band HS curls, red tband ankle exercises, red tband scapular retraction    Other Seated Lumbar Exercises physio ball in lap abdominal isometrics      Moist Heat Therapy   Number Minutes Moist Heat 12 Minutes    Moist Heat Location Shoulder;Lumbar Spine      Electrical Stimulation   Electrical Stimulation Location right shoulder/lumbar    Electrical Stimulation Action premod    Electrical Stimulation Parameters sitting    Electrical Stimulation Goals Pain                    PT Short Term Goals - 06/13/20 1743      PT SHORT TERM GOAL #1   Title Pt will be independent with initial HEP    Status On-going             PT Long Term Goals - 06/06/20 1521      PT LONG TERM  GOAL #1   Title Pt will be able to tolerate standing for the full time it takes to make dinner (~30-45 min)    Time 8    Period Weeks    Status New      PT LONG TERM GOAL #2   Title Pt will be able to tolerate ambulating at least3200' for community tasks    Time 8    Period Weeks    Status New      PT LONG TERM GOAL #3   Title Pt will be able to carry <5# of groceries to assist her husband    Status New      PT LONG TERM GOAL #4   Title Pt will report 50% decrease in overall pain.    Status New      PT LONG TERM GOAL #5   Title decrease TUG time to 20 secnods    Time 8    Period Weeks    Status New                 Plan - 06/13/20 1706    Clinical Impression Statement Patient really very sore with movements of the right arm today, she tolerated sitting exercises well other wise, she does have a lot of dififculty and needs cues to decrease the ROM to help with pain as well as for mm activation    PT Next Visit Plan we will work on strnegth, function, ROM and function, also trying to decrease the pain    Consulted and Agree with Plan of Care Patient           Patient will benefit  from skilled therapeutic intervention in order to improve the following deficits and impairments:  Abnormal gait, Decreased balance, Decreased mobility, Difficulty walking, Hypomobility, Increased muscle spasms, Decreased range of motion, Improper body mechanics, Decreased activity tolerance, Decreased strength, Impaired flexibility, Increased fascial restricitons, Postural dysfunction, Pain  Visit Diagnosis: Chronic bilateral low back pain without sciatica  Muscle spasm of back  Muscle weakness (generalized)  Abnormal posture  Difficulty in walking, not elsewhere classified  Acute pain of right shoulder  Chronic pain of left knee     Problem List Patient Active Problem List   Diagnosis Date Noted  . Ear fullness, bilateral 05/11/2020  . Financial difficulties 11/16/2019  . Osteoarthritis of left knee 06/13/2019  . Rotator cuff syndrome of right shoulder 01/21/2019  . Bladder prolapse, female, acquired 08/16/2018  . Urinary incontinence 07/05/2018  . Frequent falls 12/24/2017  . Adjustment reaction with anxiety and depression 03/26/2017  . Ruptured silicone breast implant 12/08/2016  . Iron deficiency anemia due to chronic blood loss 10/31/2015  . Chronic right-sided thoracic back pain 07/26/2015  . Spinal stenosis of lumbar region 07/10/2015  . Abdominal aortic atherosclerosis ([place]) 07/10/2015  . Sleep apnea 05/22/2010  . PERIPHERAL NEUROPATHY 02/06/2010  . PULMONARY NODULE, SOLITARY 07/20/2009  . Irritable bowel syndrome 11/12/2007  . Non-insulin dependent type 2 diabetes mellitus ([place]) 10/08/2006  . HYPERCHOLESTEROLEMIA 10/08/2006  . HYPERTRIGLYCERIDEMIA 10/08/2006  . Migraine headache 10/08/2006  . CARPAL TUNNEL SYNDROME 10/08/2006  . HYPERTENSION, BENIGN SYSTEMIC 10/08/2006  . ASTHMA, PERSISTENT, MODERATE 10/08/2006  . REFLUX ESOPHAGITIS 10/08/2006  . Eczema 10/08/2006  . Psoriasis 10/08/2006  . Osteoarthritis of spine 10/08/2006  . VERTIGO NOS OR DIZZINESS  10/08/2006    Sumner Boast., PT 06/13/2020, 5:43 PM  [place]. [place], [place], [address] Phone: [phone]   Fax:  [phone]  Name: [name] MRN: [id number] Date of Birth: 05-Nov-1940

## 2020-06-14 ENCOUNTER — Other Ambulatory Visit: Payer: Self-pay

## 2020-06-14 NOTE — Patient Outreach (Signed)
  [facility]) Care Management Chronic Special Needs Program    06/14/2020  Name: [name], DOB: 12-22-1940  MRN: [id number]  [facility] care management team has assumed care and services for this member.  Case Closed by [facility] care management.  Quinn Plowman RN,BSN,CCM [facility] Care Management [phone]

## 2020-06-19 NOTE — Telephone Encounter (Signed)
This encounter was created in error - please disregard.

## 2020-06-20 ENCOUNTER — Ambulatory Visit: Payer: HMO | Admitting: Orthopaedic Surgery

## 2020-06-20 ENCOUNTER — Encounter: Payer: HMO | Admitting: Physical Therapy

## 2020-06-20 DIAGNOSIS — L738 Other specified follicular disorders: Secondary | ICD-10-CM | POA: Diagnosis not present

## 2020-06-20 DIAGNOSIS — L309 Dermatitis, unspecified: Secondary | ICD-10-CM | POA: Diagnosis not present

## 2020-06-21 ENCOUNTER — Telehealth: Payer: Self-pay

## 2020-06-21 ENCOUNTER — Encounter: Payer: Self-pay | Admitting: Orthopaedic Surgery

## 2020-06-21 ENCOUNTER — Ambulatory Visit: Payer: HMO | Admitting: Orthopaedic Surgery

## 2020-06-21 DIAGNOSIS — M25511 Pain in right shoulder: Secondary | ICD-10-CM | POA: Diagnosis not present

## 2020-06-21 DIAGNOSIS — G8929 Other chronic pain: Secondary | ICD-10-CM

## 2020-06-21 NOTE — Progress Notes (Signed)
The patient is a 79 year old female being seen in follow-up for chronic right shoulder pain.  She says her shoulder is still very painful daily and is not getting better.  A subacromial steroid injection did not help.  Physical therapy has also not helped.  She has been compliant with going to therapy to work on better motion of the shoulder and strength.  She says it still hurts her daily and is becoming more unbearable for her.  On examination the right shoulder she has a lot of guarding when I put her through range of motion.  Clinically is well located but she does lack full abduction and extension and there is weakness in the shoulder and a lot of pain.  Her x-rays previously did not show any acute findings.  At this point a MRI of her right shoulder is warranted to assess the rotator cuff and other structures that may be causing her the significant pain that she is having.  She understands that we need this for likely surgical planning purposes.  She will continue alternating Aleve and Tylenol because that has helped her.  We will work on scheduling the MRI and we will see her back after labs obtained.

## 2020-06-21 NOTE — Telephone Encounter (Signed)
Patient called she stated she is waiting for her MRI to be scheduled she wants to know if she can still go to pt or does she need to wait until after the mri is done. Call back:[phone]

## 2020-06-22 ENCOUNTER — Encounter: Payer: HMO | Admitting: Physical Therapy

## 2020-06-22 NOTE — Telephone Encounter (Signed)
She can still go to physical therapy while she awaits the MRI.

## 2020-06-22 NOTE — Telephone Encounter (Signed)
Pt informed and stated understanding

## 2020-06-26 ENCOUNTER — Other Ambulatory Visit: Payer: Self-pay

## 2020-06-26 DIAGNOSIS — Z96611 Presence of right artificial shoulder joint: Secondary | ICD-10-CM

## 2020-06-27 ENCOUNTER — Other Ambulatory Visit: Payer: Self-pay

## 2020-06-27 ENCOUNTER — Encounter: Payer: Self-pay | Admitting: Physical Therapy

## 2020-06-27 ENCOUNTER — Ambulatory Visit: Payer: HMO | Admitting: Physical Therapy

## 2020-06-27 DIAGNOSIS — R262 Difficulty in walking, not elsewhere classified: Secondary | ICD-10-CM

## 2020-06-27 DIAGNOSIS — M545 Low back pain, unspecified: Secondary | ICD-10-CM | POA: Diagnosis not present

## 2020-06-27 DIAGNOSIS — G8929 Other chronic pain: Secondary | ICD-10-CM

## 2020-06-27 DIAGNOSIS — M25511 Pain in right shoulder: Secondary | ICD-10-CM

## 2020-06-27 DIAGNOSIS — R293 Abnormal posture: Secondary | ICD-10-CM

## 2020-06-27 DIAGNOSIS — M62830 Muscle spasm of back: Secondary | ICD-10-CM

## 2020-06-27 DIAGNOSIS — M6281 Muscle weakness (generalized): Secondary | ICD-10-CM

## 2020-06-27 NOTE — Therapy (Signed)
[place]. [place], [place], [address] Phone: [phone]   Fax:  [phone]  Physical Therapy Treatment  Patient Details  Name: [name] MRN: [id number] Date of Birth: [birth year] Referring Provider (PT): Andria Frames and Ninfa Linden   Encounter Date: 06/27/2020   PT End of Session - 06/27/20 1556    Visit Number 7    Number of Visits 16    PT Start Time 1450    PT Stop Time 1540    PT Time Calculation (min) 50 min    Activity Tolerance Patient limited by pain    Behavior During Therapy [facility] for tasks assessed/performed           Past Medical History:  Diagnosis Date  . Asthma   . Depression   . Diabetes mellitus without complication ([place])   . History of surgery on arm    right arm ( plate and screws)  . Hypertension   . Macular degeneration   . Migraine    without aura  . Urinary incontinence     Past Surgical History:  Procedure Laterality Date  . ABDOMINAL HYSTERECTOMY     ?Lt. ovary remains  . APPENDECTOMY    . Arm surgery    . BREAST BIOPSY Right 10/10/2009  . BREAST CYST ASPIRATION  12/20/2013  . BREAST EXCISIONAL BIOPSY Left 1998  . BREAST EXCISIONAL BIOPSY Right 1993    There were no vitals filed for this visit.   Subjective Assessment - 06/27/20 1459    Subjective Reports right hsoudler pain, saw orthopod last week, she has and MRI scheduled for Decmber 2nd.  right shoulder pain high    Currently in Pain? Yes    Pain Score 8     Pain Location Shoulder    Pain Orientation Right    Aggravating Factors  reaching and sleeping increases pain to 10/10, LBP 8/10                             [facility] Adult PT Treatment/Exercise - 06/27/20 0001      Lumbar Exercises: Aerobic   Nustep level 4 x 7 minutes did not use the right arm      Lumbar Exercises: Seated   Long Arc Quad on Chair Both;2 sets;10 reps    LAQ on Chair Weights (lbs) 3    Other Seated Lumbar Exercises  red band HS curls, red tband ankle exercises, 3# marches    Other Seated Lumbar Exercises physio ball in lap abdominal isometrics      Moist Heat Therapy   Number Minutes Moist Heat 12 Minutes    Moist Heat Location Shoulder;Lumbar Spine      Electrical Stimulation   Electrical Stimulation Location right shoulder/lumbar    Electrical Stimulation Action premod    Electrical Stimulation Parameters sitting    Electrical Stimulation Goals Pain                    PT Short Term Goals - 06/13/20 1743      PT SHORT TERM GOAL #1   Title Pt will be independent with initial HEP    Status On-going             PT Long Term Goals - 06/27/20 1559      PT LONG TERM GOAL #1   Title Pt will be able to tolerate standing for the full time it takes to make dinner (~  30-45 min)    Status On-going      PT LONG TERM GOAL #2   Title Pt will be able to tolerate ambulating at least3200' for community tasks    Status On-going      PT LONG TERM GOAL #3   Title Pt will be able to carry <5# of groceries to assist her husband    Status On-going      PT LONG TERM GOAL #4   Title Pt will report 50% decrease in overall pain.    Status On-going      PT LONG TERM GOAL #5   Title decrease TUG time to 20 secnods    Status On-going                 Plan - 06/27/20 1557    Clinical Impression Statement Patient continues to report very bad pain in the right shoulder and the low back, she reports that she will have an MRI of the shoulder in about 2 weeks.  She does the exercises but I have to really alter the ROM, the weight and the position to tolerate it, I usually can get the pain not to increase with the activity, I chose not to do much with the right shoulder until after the MRI    PT Next Visit Plan we will work on strnegth, function, ROM and function, also trying to decrease the pain    Consulted and Agree with Plan of Care Patient           Patient will benefit from skilled  therapeutic intervention in order to improve the following deficits and impairments:  Abnormal gait, Decreased balance, Decreased mobility, Difficulty walking, Hypomobility, Increased muscle spasms, Decreased range of motion, Improper body mechanics, Decreased activity tolerance, Decreased strength, Impaired flexibility, Increased fascial restricitons, Postural dysfunction, Pain  Visit Diagnosis: Chronic bilateral low back pain without sciatica  Muscle spasm of back  Muscle weakness (generalized)  Abnormal posture  Difficulty in walking, not elsewhere classified  Acute pain of right shoulder  Chronic pain of left knee     Problem List Patient Active Problem List   Diagnosis Date Noted  . Ear fullness, bilateral 05/11/2020  . Financial difficulties 11/16/2019  . Osteoarthritis of left knee 06/13/2019  . Rotator cuff syndrome of right shoulder 01/21/2019  . Bladder prolapse, female, acquired 08/16/2018  . Urinary incontinence 07/05/2018  . Frequent falls 12/24/2017  . Adjustment reaction with anxiety and depression 03/26/2017  . Ruptured silicone breast implant 12/08/2016  . Iron deficiency anemia due to chronic blood loss 10/31/2015  . Chronic right-sided thoracic back pain 07/26/2015  . Spinal stenosis of lumbar region 07/10/2015  . Abdominal aortic atherosclerosis ([place]) 07/10/2015  . Sleep apnea 05/22/2010  . PERIPHERAL NEUROPATHY 02/06/2010  . PULMONARY NODULE, SOLITARY 07/20/2009  . Irritable bowel syndrome 11/12/2007  . Non-insulin dependent type 2 diabetes mellitus ([place]) 10/08/2006  . HYPERCHOLESTEROLEMIA 10/08/2006  . HYPERTRIGLYCERIDEMIA 10/08/2006  . Migraine headache 10/08/2006  . CARPAL TUNNEL SYNDROME 10/08/2006  . HYPERTENSION, BENIGN SYSTEMIC 10/08/2006  . ASTHMA, PERSISTENT, MODERATE 10/08/2006  . REFLUX ESOPHAGITIS 10/08/2006  . Eczema 10/08/2006  . Psoriasis 10/08/2006  . Osteoarthritis of spine 10/08/2006  . VERTIGO NOS OR DIZZINESS 10/08/2006     Sumner Boast., PT 06/27/2020, 4:00 PM  [place]. [place], [place], [address] Phone: [phone]   Fax:  [phone]  Name: [name] MRN: [id number] Date of Birth: [birth year]

## 2020-06-28 ENCOUNTER — Other Ambulatory Visit: Payer: Self-pay | Admitting: Family Medicine

## 2020-06-28 DIAGNOSIS — R112 Nausea with vomiting, unspecified: Secondary | ICD-10-CM

## 2020-07-03 ENCOUNTER — Ambulatory Visit: Payer: HMO | Admitting: Physical Therapy

## 2020-07-04 ENCOUNTER — Encounter: Payer: HMO | Admitting: Physical Therapy

## 2020-07-08 ENCOUNTER — Telehealth (HOSPITAL_COMMUNITY): Payer: Self-pay | Admitting: *Deleted

## 2020-07-08 ENCOUNTER — Ambulatory Visit (HOSPITAL_COMMUNITY)
Admission: EM | Admit: 2020-07-08 | Discharge: 2020-07-08 | Disposition: A | Discharge status: Home / Self Care | Payer: HMO | Attending: Family Medicine | Admitting: Family Medicine

## 2020-07-08 ENCOUNTER — Other Ambulatory Visit: Payer: Self-pay

## 2020-07-08 ENCOUNTER — Encounter (HOSPITAL_COMMUNITY): Payer: Self-pay

## 2020-07-08 DIAGNOSIS — M546 Pain in thoracic spine: Secondary | ICD-10-CM

## 2020-07-08 DIAGNOSIS — T148XXA Other injury of unspecified body region, initial encounter: Secondary | ICD-10-CM

## 2020-07-08 DIAGNOSIS — M25511 Pain in right shoulder: Secondary | ICD-10-CM | POA: Diagnosis not present

## 2020-07-08 MED ORDER — DEXAMETHASONE SODIUM PHOSPHATE 10 MG/ML IJ SOLN
10.0000 mg | Freq: Once | INTRAMUSCULAR | Status: AC
  Administered 2020-07-08: 10 mg via INTRAMUSCULAR

## 2020-07-08 MED ORDER — HYDROCODONE-ACETAMINOPHEN 5-325 MG PO TABS: 1.0000 | ORAL_TABLET | Freq: Four times a day (QID) | ORAL | 0 refills | Status: DC | PRN

## 2020-07-08 MED ORDER — HYDROCODONE-ACETAMINOPHEN 5-325 MG PO TABS: 1.0000 | ORAL_TABLET | Freq: Four times a day (QID) | ORAL | 0 refills | Status: AC | PRN

## 2020-07-08 MED ORDER — DEXAMETHASONE SODIUM PHOSPHATE 10 MG/ML IJ SOLN
INTRAMUSCULAR | Status: AC
  Filled 2020-07-08: qty 1

## 2020-07-08 MED ORDER — TIZANIDINE HCL 4 MG PO TABS: 4.0000 mg | ORAL_TABLET | Freq: Four times a day (QID) | ORAL | 0 refills | Status: DC | PRN

## 2020-07-08 NOTE — ED Triage Notes (Signed)
Pt present back pain, symptoms started two days ago. Pt state the spasm comes and goes.

## 2020-07-08 NOTE — Discharge Instructions (Addendum)
You have received a steroid injection in the office today to help with inflammation  You may take up to 800 mg of ibuprofen 3 times a day as needed for inflammation and pain.    I have sent in [place] for you to take 1 tablet every 6 hours as needed for pain.  Do not take Tylenol with this medication.  Follow up with Dr Ninfa Linden for MRI  I have sent in tizanidine for you to take every 6 hours as needed for muscle relaxer

## 2020-07-08 NOTE — Telephone Encounter (Signed)
Pharmacy closed. Resent to another pharmacy.

## 2020-07-08 NOTE — ED Provider Notes (Signed)
[facility]   [id number] 07/08/20 Arrival Time: 1303  IO:NGEXB PAIN  SUBJECTIVE: History from: patient. [name] is a 79 y.o. female complains of right shoulder and thoracic back pain that began 4 days ago. Denies a precipitating event or specific injury.  Has history of right shoulder injury.  Has MRI scheduled for next week.  Is followed by Dr. Ninfa Linden for this.  Localizes the pain to the right shoulder, shoulder blade, right thoracic back.  Describes the pain as constant and achy in character, then progresses to sharp and severe with certain movements.  Has tried OTC medications without relief.  Symptoms are made worse with activity.  Denies fever, chills, erythema, ecchymosis, effusion, weakness, numbness and tingling, saddle paresthesias, loss of bowel or bladder function.      ROS: As per HPI.  All other pertinent ROS negative.     Past Medical History:  Diagnosis Date  . Asthma   . Depression   . Diabetes mellitus without complication ([place])   . History of surgery on arm    right arm ( plate and screws)  . Hypertension   . Macular degeneration   . Migraine    without aura  . Urinary incontinence    Past Surgical History:  Procedure Laterality Date  . ABDOMINAL HYSTERECTOMY     ?Lt. ovary remains  . APPENDECTOMY    . Arm surgery    . BREAST BIOPSY Right 10/10/2009  . BREAST CYST ASPIRATION  12/20/2013  . BREAST EXCISIONAL BIOPSY Left 1998  . BREAST EXCISIONAL BIOPSY Right 1993   Allergies  Allergen Reactions  . Amoxicillin Rash  . Azithromycin Rash    Itching/rash  . Butalbital Other (See Comments)    Reaction: trouble breathing, but uncertain  . Clindamycin Hcl Rash  . Hydrocodone-Acetaminophen Rash    REACTION: developed rash  . Januvia [Sitagliptin] Other (See Comments)    Worsened migraines.  [place] [Levofloxacin In D5w] Diarrhea  . Meperidine Hcl Other (See Comments)    REACTION: itching and hallucinations  . Sulfamethoxazole Rash  .  Buspirone     Made urinary incontinence worse.    Marland Kitchen Ketorolac Swelling    Received ketorolac, methocarbamal, and lidocaine patch all at the same time.  Lip swelling 12 hours later  . Lidocaine Swelling    Received ketorolac, methocarbamal, and lidocaine patch all at the same time.  Lip swelling 12 hours later  . Macrodantin [Nitrofurantoin Macrocrystal] Itching  . Methocarbamol Swelling    Received ketorolac, methocarbamal, and lidocaine patch all at the same time.  Lip swelling 12 hours later  . Codeine Phosphate Other (See Comments)    REACTION: unknown  . Etodolac Other (See Comments)    REACTION: stomach upset  . Gabapentin Other (See Comments)    REACTION: mental clouding  Does not want to try again even in low dose.  . Naproxen Other (See Comments)    REACTION: stomach upset  . Pentazocine-Naloxone Hcl Other (See Comments)    REACTION: unspecified  . Sumatriptan Other (See Comments)    REACTION: chest pain  . Zonegran Other (See Comments)    Reaction: Unknown   No current facility-administered medications on file prior to encounter.   Current Outpatient Medications on File Prior to Encounter  Medication Sig Dispense Refill  . benzonatate (TESSALON) 200 MG capsule Take 1 capsule by mouth three times daily as needed for cough 20 capsule 0  . DULoxetine (CYMBALTA) 60 MG capsule Take 1 capsule by mouth once  daily 90 capsule 3  . empagliflozin (JARDIANCE) 25 MG TABS tablet Take 1 tablet (25 mg total) by mouth daily. 14 tablet 0  . enalapril (VASOTEC) 5 MG tablet Take 1 tablet (5 mg total) by mouth at bedtime. 90 tablet 3  . famotidine (PEPCID) 40 MG tablet Take 40 mg by mouth every evening.     . fluconazole (DIFLUCAN) 150 MG tablet Take 1 tablet by mouth once daily 2 tablet 0  . glucose blood (ONETOUCH VERIO) test strip 1 each by Other route 2 (two) times daily. Use as instructed 100 strip 3  . Lancets (ONETOUCH DELICA PLUS URKYHC62B) MISC Apply 1 Device topically in the morning  and at bedtime. for testing blood sugar 100 each 3  . loratadine (CLARITIN) 10 MG tablet Take 10 mg by mouth daily.    . Multiple Vitamin (MULTIVITAMIN WITH MINERALS) TABS tablet Take 1 tablet by mouth daily.    . nortriptyline (PAMELOR) 50 MG capsule Take 1 capsule (50 mg total) by mouth at bedtime. 180 capsule 3  . ondansetron (ZOFRAN) 4 MG tablet TAKE 1 TABLET BY MOUTH EVERY 8 HOURS AS NEEDED FOR NAUSEA AND VOMITING 20 tablet 0  . rosuvastatin (CRESTOR) 20 MG tablet Take 1 tablet (20 mg total) by mouth daily. Takes the place of simvastatin. 90 tablet 3  . Semaglutide,0.25 or 0.5MG /DOS, (OZEMPIC, 0.25 OR 0.5 MG/DOSE,) 2 MG/1.5ML SOPN Inject 0.5 mg into the skin once a week. 1 pen 0  . topiramate (TOPAMAX) 50 MG tablet TAKE 3 TABLETS BY MOUTH ONCE DAILY AT BEDTIME 270 tablet 3  . traMADol (ULTRAM) 50 MG tablet TAKE 1 TABLET BY MOUTH EVERY 8 HOURS AS NEEDED 90 tablet 5  . triamcinolone ointment (KENALOG) 0.5 % Apply 1 application topically 2 (two) times daily. For elbows 30 g 2  . vitamin C (ASCORBIC ACID) 250 MG tablet Take 500 mg by mouth daily.      Social History   Socioeconomic History  . Marital status: Married    Spouse name: Not on file  . Number of children: Not on file  . Years of education: Not on file  . Highest education level: Not on file  Occupational History  . Not on file  Tobacco Use  . Smoking status: Former Smoker    Packs/day: 2.00    Years: 33.00    Pack years: 66.00    Types: Cigarettes    Start date: 08/11/1957    Quit date: 04/04/1991    Years since quitting: 29.2  . Smokeless tobacco: Never Used  Vaping Use  . Vaping Use: Never used  Substance and Sexual Activity  . Alcohol use: No  . Drug use: No  . Sexual activity: Not Currently  Other Topics Concern  . Not on file  Social History Narrative  . Not on file   Social Determinants of Health   Financial Resource Strain:   . Difficulty of Paying Living Expenses: Not on file  Food Insecurity: No Food  Insecurity  . Worried About Charity fundraiser in the Last Year: Never true  . Ran Out of Food in the Last Year: Never true  Transportation Needs: No Transportation Needs  . Lack of Transportation (Medical): No  . Lack of Transportation (Non-Medical): No  Physical Activity:   . Days of Exercise per Week: Not on file  . Minutes of Exercise per Session: Not on file  Stress:   . Feeling of Stress : Not on file  Social Connections:   .  Frequency of Communication with Friends and Family: Not on file  . Frequency of Social Gatherings with Friends and Family: Not on file  . Attends Religious Services: Not on file  . Active Member of Clubs or Organizations: Not on file  . Attends Archivist Meetings: Not on file  . Marital Status: Not on file  Intimate Partner Violence:   . Fear of Current or Ex-Partner: Not on file  . Emotionally Abused: Not on file  . Physically Abused: Not on file  . Sexually Abused: Not on file   Family History  Problem Relation Age of Onset  . Hypertension Mother   . Hypertension Father   . Hearing loss Sister   . Heart attack Sister 31       s/p 4 bypasses   . Stroke Brother        88  . Breast cancer Maternal Aunt   . Ovarian cancer Maternal Aunt     OBJECTIVE:  Vitals:   07/08/20 1356  BP: (!) 162/84  Pulse: 99  Resp: 16  Temp: 97.9 F (36.6 C)  TempSrc: Oral  SpO2: 97%    General appearance: ALERT; in no acute distress, appears uncomfortable, unable to sit still in the chair Head: NCAT Lungs: Normal respiratory effort CV:  pulses 2+ bilaterally. Cap refill < 2 seconds Musculoskeletal:  Inspection: Skin warm, dry, clear and intact without obvious erythema, effusion, or ecchymosis.  Palpation: Right shoulder blade and thoracic back tender to palpation, muscles in spasm ROM: Limited ROM active and passive to right shoulder as well as back with bending, twisting, changing positions Skin: warm and dry Neurologic: Ambulates without  difficulty; Sensation intact about the upper/ lower extremities Psychological: alert and cooperative; normal mood and affect  DIAGNOSTIC STUDIES:   ASSESSMENT & PLAN:  1. Muscle strain   2. Right shoulder pain, unspecified chronicity   3. Acute right-sided thoracic back pain      Meds ordered this encounter  Medications  . dexamethasone (DECADRON) injection 10 mg  . tiZANidine (ZANAFLEX) 4 MG tablet    Sig: Take 1 tablet (4 mg total) by mouth every 6 (six) hours as needed for muscle spasms.    Dispense:  30 tablet    Refill:  0    Order Specific Question:   Supervising Provider    Answer:   Chase Picket [id number]  . HYDROcodone-acetaminophen (NORCO/VICODIN) 5-325 MG tablet    Sig: Take 1 tablet by mouth every 6 (six) hours as needed for up to 3 days for moderate pain or severe pain.    Dispense:  10 tablet    Refill:  0    Order Specific Question:   Supervising Provider    Answer:   Chase Picket [[id number]]   Decadron injection in the office today Prescribed Norco Prescription tizanidine Continue conservative management of rest, ice, and gentle stretches Take ibuprofen as needed for pain relief (may cause abdominal discomfort, ulcers, and GI bleeds avoid taking with other NSAIDs) Take tizanidine at nighttime for symptomatic relief. Avoid driving or operating heavy machinery while using medication. Follow up with PCP if symptoms persist Return or go to the ER if you have any new or worsening symptoms (fever, chills, chest pain, abdominal pain, changes in bowel or bladder habits, pain radiating into lower legs)   [place] Controlled Substances Registry consulted for this patient. I feel the risk/benefit ratio today is favorable for proceeding with this prescription for a controlled substance. Medication sedation precautions given.  Reviewed expectations re: course of current medical issues. Questions answered. Outlined signs and symptoms indicating need for more acute  intervention. Patient verbalized understanding. After Visit Summary given.       Faustino Congress, NP 07/08/20 1439

## 2020-07-11 ENCOUNTER — Ambulatory Visit: Payer: HMO | Admitting: Physical Therapy

## 2020-07-12 DIAGNOSIS — E119 Type 2 diabetes mellitus without complications: Secondary | ICD-10-CM | POA: Diagnosis not present

## 2020-07-12 DIAGNOSIS — M5134 Other intervertebral disc degeneration, thoracic region: Secondary | ICD-10-CM | POA: Diagnosis not present

## 2020-07-12 DIAGNOSIS — M546 Pain in thoracic spine: Secondary | ICD-10-CM | POA: Diagnosis not present

## 2020-07-13 ENCOUNTER — Telehealth: Payer: Self-pay | Admitting: *Deleted

## 2020-07-13 NOTE — Telephone Encounter (Signed)
Patient states that she is seeing Dr. Matilde Sprang ( urologist) and he needs to know what abx we have given her in the past that she has not been allergic to.   They are having trouble finding a medication for her to be on long term. Christen Bame, CMA

## 2020-07-14 ENCOUNTER — Other Ambulatory Visit: Payer: Self-pay | Admitting: Family Medicine

## 2020-07-16 ENCOUNTER — Ambulatory Visit: Payer: HMO | Admitting: Physician Assistant

## 2020-07-16 ENCOUNTER — Other Ambulatory Visit: Payer: Self-pay

## 2020-07-16 ENCOUNTER — Encounter: Payer: Self-pay | Admitting: Physician Assistant

## 2020-07-16 ENCOUNTER — Ambulatory Visit (INDEPENDENT_AMBULATORY_CARE_PROVIDER_SITE_OTHER): Payer: HMO

## 2020-07-16 DIAGNOSIS — M545 Low back pain, unspecified: Secondary | ICD-10-CM | POA: Diagnosis not present

## 2020-07-16 DIAGNOSIS — M25511 Pain in right shoulder: Secondary | ICD-10-CM

## 2020-07-16 DIAGNOSIS — M549 Dorsalgia, unspecified: Secondary | ICD-10-CM | POA: Diagnosis not present

## 2020-07-16 DIAGNOSIS — G8929 Other chronic pain: Secondary | ICD-10-CM

## 2020-07-16 MED ORDER — HYDROCODONE-ACETAMINOPHEN 5-325 MG PO TABS
1.0000 | ORAL_TABLET | Freq: Four times a day (QID) | ORAL | 0 refills | Status: DC | PRN
Start: 2020-07-16 — End: 2020-07-23

## 2020-07-16 MED ORDER — DIAZEPAM 5 MG PO TABS
ORAL_TABLET | ORAL | 1 refills | Status: DC
Start: 2020-07-16 — End: 2020-08-31

## 2020-07-16 NOTE — Telephone Encounter (Signed)
Yes, she has multiple allergies.  We have listed Amoxicillin Azithromycin Clincamycin Sulfmethoxazole Levaquin  I have used cepalexin nitrofurantoin

## 2020-07-16 NOTE — Progress Notes (Signed)
Office Visit Note   Patient: [name]           Date of Birth: 1940-12-31           MRN: [id number] Visit Date: 07/16/2020              Requested by: Zenia Resides, MD [address] [place],  [place] [address] PCP: Zenia Resides, MD   Assessment & Plan: Visit Diagnoses:  1. Mid back pain   2. Chronic right shoulder pain     Plan: Refills given on her Norco.  She will continue her Zanaflex.  Recommend heat to the right arm.  She will obtain the MRI and follow-up as scheduled to go over the results of the MRI of the right shoulder.  Regards to the lumbar spine due to the fact that she continues to have significant pain in the lumbar spine and has a positive straight leg raise despite conservative treatment recommend thoracolumbar MRI to rule out HNP as a source of her right sided paraspinous back pain also for epidural steroid planning.  Questions were encouraged and answered at length.  Follow-Up Instructions: No follow-ups on file.   Orders:  Orders Placed This Encounter  Procedures  . XR Thoracic Spine 2 View   Meds ordered this encounter  Medications  . HYDROcodone-acetaminophen (NORCO/VICODIN) 5-325 MG tablet    Sig: Take 1 tablet by mouth every 6 (six) hours as needed for moderate pain.    Dispense:  30 tablet    Refill:  0  . diazepam (VALIUM) 5 MG tablet    Sig: TAKE ONE TAB ONE HOUR PRIOR TO MRI REPEAT AS NEEDED #2 . ZERO REFILLS    Dispense:  2 tablet    Refill:  1      Procedures: No procedures performed   Clinical Data: No additional findings.   Subjective: Chief Complaint  Patient presents with  . Right Shoulder - Pain  . Middle Back - Pain    HPI [name] 79 year old female well-known to Dr. Ninfa Linden service comes in today with right biceps pain and discoloration.  She was seen in the ER on 06/30/2017 to right shoulder pain and low back pain.  She does state that over the last few days she has had discoloration of her right  biceps region and feels that the shoulder locked up yesterday.  She has MRI of her shoulder scheduled for tomorrow.  She has been seen but not limited for chronic right shoulder pain that is failed conservative treatment.  She denies any injury to the right arm.  Patient also has history of low thoracic right-sided back pain that is been ongoing for years.  She has been going to physical therapy for her back and her shoulder also saw a chiropractor 2 months ago due to the back pain.  Reports a history of epidural steroid injections in the past that were beneficial.  She denies any numbness tingling down either leg.  The ER did place her on ibuprofen Norco and Zanaflex and she states each of these medicines helped some but did not alleviate her pain completely.  She has had no fevers, chills or recent vaccines.  Review of Systems See HPI  Objective: Vital Signs: There were no vitals taken for this visit.  Physical Exam Constitutional:      Appearance: She is not ill-appearing or diaphoretic.  Pulmonary:     Effort: Pulmonary effort is normal.  Neurological:     Mental  Status: She is alert and oriented to person, place, and time.  Psychiatric:        Mood and Affect: Mood normal.     Ortho Exam Right shoulder she has ecchymosis and tenderness over the biceps muscle belly consistent with a biceps rupture proximally.  Distal biceps is intact.  She is reluctant to have any range of motion performed on the shoulder today. Lumbar spine she has tenderness in the paraspinous region lower thoracic upper lumbar spine region on the right.  Positive straight leg raise on the right.  5 out of 5 strength throughout lower extremities against resistance.  Sensation grossly intact bilateral feet to light touch dorsal pedal pulses are present bilaterally.  No rashes skin lesions over the lumbar thoracic region particularly on the right side. Specialty Comments:  No specialty comments available.  Imaging: XR  Thoracic Spine 2 View  Result Date: 07/16/2020 Thoracic spine 2 views: Films include the mid thoracic spine down to the L5 lumbar vertebrae.  No acute fractures.  There is grade 1 anterior spondylolisthesis of L3-4 which is unchanged from prior films.  L3-4 L4-5 disc space narrowing.  Arthrosclerosis of aorta noted.    PMFS History: Patient Active Problem List   Diagnosis Date Noted  . Ear fullness, bilateral 05/11/2020  . Financial difficulties 11/16/2019  . Osteoarthritis of left knee 06/13/2019  . Rotator cuff syndrome of right shoulder 01/21/2019  . Bladder prolapse, female, acquired 08/16/2018  . Urinary incontinence 07/05/2018  . Frequent falls 12/24/2017  . Adjustment reaction with anxiety and depression 03/26/2017  . Ruptured silicone breast implant 12/08/2016  . Iron deficiency anemia due to chronic blood loss 10/31/2015  . Chronic right-sided thoracic back pain 07/26/2015  . Spinal stenosis of lumbar region 07/10/2015  . Abdominal aortic atherosclerosis ([place]) 07/10/2015  . Sleep apnea 05/22/2010  . PERIPHERAL NEUROPATHY 02/06/2010  . PULMONARY NODULE, SOLITARY 07/20/2009  . Irritable bowel syndrome 11/12/2007  . Non-insulin dependent type 2 diabetes mellitus ([place]) 10/08/2006  . HYPERCHOLESTEROLEMIA 10/08/2006  . HYPERTRIGLYCERIDEMIA 10/08/2006  . Migraine headache 10/08/2006  . CARPAL TUNNEL SYNDROME 10/08/2006  . HYPERTENSION, BENIGN SYSTEMIC 10/08/2006  . ASTHMA, PERSISTENT, MODERATE 10/08/2006  . REFLUX ESOPHAGITIS 10/08/2006  . Eczema 10/08/2006  . Psoriasis 10/08/2006  . Osteoarthritis of spine 10/08/2006  . VERTIGO NOS OR DIZZINESS 10/08/2006   Past Medical History:  Diagnosis Date  . Asthma   . Depression   . Diabetes mellitus without complication ([place])   . History of surgery on arm    right arm ( plate and screws)  . Hypertension   . Macular degeneration   . Migraine    without aura  . Urinary incontinence     Family History  Problem Relation Age  of Onset  . Hypertension Mother   . Hypertension Father   . Hearing loss Sister   . Heart attack Sister 87       s/p 4 bypasses   . Stroke Brother        5  . Breast cancer Maternal Aunt   . Ovarian cancer Maternal Aunt     Past Surgical History:  Procedure Laterality Date  . ABDOMINAL HYSTERECTOMY     ?Lt. ovary remains  . APPENDECTOMY    . Arm surgery    . BREAST BIOPSY Right 10/10/2009  . BREAST CYST ASPIRATION  12/20/2013  . BREAST EXCISIONAL BIOPSY Left 1998  . BREAST EXCISIONAL BIOPSY Right 1993   Social History   Occupational History  . Not  on file  Tobacco Use  . Smoking status: Former Smoker    Packs/day: 2.00    Years: 33.00    Pack years: 66.00    Types: Cigarettes    Start date: 08/11/1957    Quit date: 04/04/1991    Years since quitting: 29.3  . Smokeless tobacco: Never Used  Vaping Use  . Vaping Use: Never used  Substance and Sexual Activity  . Alcohol use: No  . Drug use: No  . Sexual activity: Not Currently

## 2020-07-17 ENCOUNTER — Ambulatory Visit
Admission: RE | Admit: 2020-07-17 | Discharge: 2020-07-17 | Disposition: A | Discharge status: Home / Self Care | Payer: HMO | Source: Ambulatory Visit | Attending: Orthopaedic Surgery | Admitting: Orthopaedic Surgery

## 2020-07-17 DIAGNOSIS — Z96611 Presence of right artificial shoulder joint: Secondary | ICD-10-CM

## 2020-07-17 DIAGNOSIS — M75111 Incomplete rotator cuff tear or rupture of right shoulder, not specified as traumatic: Secondary | ICD-10-CM | POA: Diagnosis not present

## 2020-07-17 NOTE — Addendum Note (Signed)
Addended by: Robyne Peers on: 07/17/2020 10:26 AM   Modules accepted: Orders

## 2020-07-18 ENCOUNTER — Telehealth: Payer: Self-pay | Admitting: Orthopaedic Surgery

## 2020-07-18 ENCOUNTER — Encounter: Payer: HMO | Admitting: Physical Therapy

## 2020-07-18 NOTE — Telephone Encounter (Signed)
Patient called requesting MRI results be read to her over the phone. Patient states if Dr. Ninfa Linden can't read results she will keep Monday's appt but patient would like for doctor to call as soon as he received results. Please call patient at [phone].

## 2020-07-18 NOTE — Telephone Encounter (Signed)
Results not in chart yet.

## 2020-07-19 ENCOUNTER — Ambulatory Visit (INDEPENDENT_AMBULATORY_CARE_PROVIDER_SITE_OTHER): Payer: HMO | Admitting: Family Medicine

## 2020-07-19 ENCOUNTER — Other Ambulatory Visit: Payer: Self-pay

## 2020-07-19 ENCOUNTER — Encounter: Payer: Self-pay | Admitting: Family Medicine

## 2020-07-19 DIAGNOSIS — I10 Essential (primary) hypertension: Secondary | ICD-10-CM | POA: Diagnosis not present

## 2020-07-19 DIAGNOSIS — B3731 Acute candidiasis of vulva and vagina: Secondary | ICD-10-CM

## 2020-07-19 DIAGNOSIS — B373 Candidiasis of vulva and vagina: Secondary | ICD-10-CM | POA: Diagnosis not present

## 2020-07-19 DIAGNOSIS — Z1159 Encounter for screening for other viral diseases: Secondary | ICD-10-CM | POA: Diagnosis not present

## 2020-07-19 DIAGNOSIS — E119 Type 2 diabetes mellitus without complications: Secondary | ICD-10-CM

## 2020-07-19 DIAGNOSIS — F4323 Adjustment disorder with mixed anxiety and depressed mood: Secondary | ICD-10-CM

## 2020-07-19 DIAGNOSIS — M75101 Unspecified rotator cuff tear or rupture of right shoulder, not specified as traumatic: Secondary | ICD-10-CM | POA: Diagnosis not present

## 2020-07-19 DIAGNOSIS — R32 Unspecified urinary incontinence: Secondary | ICD-10-CM

## 2020-07-19 MED ORDER — FLUCONAZOLE 150 MG PO TABS: 150.0000 mg | ORAL_TABLET | Freq: Once | ORAL | 1 refills | Status: AC

## 2020-07-19 MED ORDER — OZEMPIC (0.25 OR 0.5 MG/DOSE) 2 MG/1.5ML ~~LOC~~ SOPN: 1.0000 mg | PEN_INJECTOR | SUBCUTANEOUS | Status: DC

## 2020-07-19 NOTE — Patient Instructions (Addendum)
Stop the jariance.  It is likely causing yeast infections. Increase the Ozempic to 1 mg weekly. See me in early Jan for prescriptions and A1C check. I sent in an Rx for diflucan I will look into an incontinence specialist. I will call with the kidney test result You need to eat with the ibuprofen. Maybe you should eat with the hydrocodone You do not need to eat with the tizanidine.   You should ask questions if ortho recommends surgery What are non surgical approaches.  What could I expect? What is we delay for several months and focus on PT before deciding about surgery.   Bring in your COVID shot card next visit. Make sure your eye doc sends me a report about diabetes and the eye.

## 2020-07-20 ENCOUNTER — Telehealth: Payer: Self-pay

## 2020-07-20 ENCOUNTER — Encounter: Payer: Self-pay | Admitting: Family Medicine

## 2020-07-20 DIAGNOSIS — R32 Unspecified urinary incontinence: Secondary | ICD-10-CM

## 2020-07-20 LAB — HEPATITIS C ANTIBODY: Hep C Virus Ab: <0.1 s/co ratio (ref 0.0–0.9)

## 2020-07-20 LAB — BASIC METABOLIC PANEL
BUN/Creatinine Ratio: 21 (ref 12–28)
BUN: 21 mg/dL (ref 8–27)
CO2: 19 mmol/L — ABNORMAL LOW (ref 20–29)
Calcium: 9.4 mg/dL (ref 8.7–10.3)
Chloride: 107 mmol/L — ABNORMAL HIGH (ref 96–106)
Creatinine, Ser: 0.99 mg/dL (ref 0.57–1.00)
GFR calc Af Amer: 63 mL/min/{1.73_m2} (ref >59)
GFR calc non Af Amer: 55 mL/min/{1.73_m2} — ABNORMAL LOW (ref >59)
Glucose: 79 mg/dL (ref 65–99)
Potassium: 4.7 mmol/L (ref 3.5–5.2)
Sodium: 144 mmol/L (ref 134–144)

## 2020-07-20 NOTE — Assessment & Plan Note (Signed)
Worse with recent shoulder injury and increased limitations.

## 2020-07-20 NOTE — Assessment & Plan Note (Signed)
>>  ASSESSMENT AND PLAN FOR ADJUSTMENT REACTION WITH ANXIETY AND DEPRESSION WRITTEN ON 07/20/2020  1:52 PM BY HENSEL, ELSIE LABOR, MD  Worse with recent shoulder injury and increased limitations.

## 2020-07-20 NOTE — Assessment & Plan Note (Signed)
Will arrange gyn referral.

## 2020-07-20 NOTE — Telephone Encounter (Signed)
Patient calls nurse line reporting Ozempic increase at last visit. Patient reports she is now injecting 1.35ml and her pen only goes up to .59ml. Patient reports she is now in her "donut hole" and she can not afford another pen. Please advise on samples.

## 2020-07-20 NOTE — Progress Notes (Signed)
    SUBJECTIVE:   CHIEF COMPLAINT / HPI:   Oh my, a host of issues: 1. Terrible recurrent yeast vaginitis since begun on flosin. 2. DM control seems good now - but worry f stop flosin.  Tolerating Ozempic well at 0.5 mg dose weekly. 3. Chronic urinary incontinence remains a huge lifestyle problem.  She wants a second opinion.    4. Has had COVID booster but she does not have her card today. 5. Old fall injuring right side and recent problem with right arm.  Painful back and right arm.  Entire right arm is black and blue.  Had MRI.  Wants me to review.  Partial or full tear of all three rotator cuff tendons and full tear of long head of biceps.  Has ortho FU 6. At her wits end with her health, her husband's health and their financial problems.  Wants answers and solutions.    OBJECTIVE:   BP (!) 152/72   Pulse 99   Ht 4\' 10"  (1.473 m)   Wt 198 lb 12.8 oz (90.2 kg)   SpO2 96%   BMI 41.55 kg/m   Clearly stressed  Lungs clear Cardiac RRR without m or g Rt arm dense eccymosis.  Very limited ROM of right shoulder.   ASSESSMENT/PLAN:   Non-insulin dependent type 2 diabetes mellitus ([place]) DC flosin and increase Ozempic to 1 mg weekly  Rotator cuff syndrome of right shoulder Followed by ortho.  Seems to have an acute tear of long head of biceps.  Urinary incontinence Will arrange gyn referral.  Adjustment reaction with anxiety and depression Worse with recent shoulder injury and increased limitations.     Zenia Resides, MD [place]

## 2020-07-20 NOTE — Assessment & Plan Note (Signed)
Followed by ortho.  Seems to have an acute tear of long head of biceps.

## 2020-07-20 NOTE — Assessment & Plan Note (Signed)
DC flosin and increase Ozempic to 1 mg weekly

## 2020-07-23 ENCOUNTER — Ambulatory Visit: Payer: HMO | Admitting: Orthopaedic Surgery

## 2020-07-23 ENCOUNTER — Encounter: Payer: Self-pay | Admitting: Orthopaedic Surgery

## 2020-07-23 DIAGNOSIS — S46211D Strain of muscle, fascia and tendon of other parts of biceps, right arm, subsequent encounter: Secondary | ICD-10-CM

## 2020-07-23 DIAGNOSIS — G8929 Other chronic pain: Secondary | ICD-10-CM | POA: Diagnosis not present

## 2020-07-23 DIAGNOSIS — M25511 Pain in right shoulder: Secondary | ICD-10-CM | POA: Diagnosis not present

## 2020-07-23 MED ORDER — HYDROCODONE-ACETAMINOPHEN 5-325 MG PO TABS: 1.0000 | ORAL_TABLET | Freq: Four times a day (QID) | ORAL | 0 refills | Status: DC | PRN

## 2020-07-23 NOTE — Telephone Encounter (Signed)
Appt today

## 2020-07-23 NOTE — Telephone Encounter (Signed)
[name], [name] could not tolerate Jardiance due to continuous yeast vaginitis.  I told her to stop and increase her ozempic. How do we handle this?  Stay on the 0.5 until Jan and then increase?  Tell her to take two shots weekly of the 0.5 while your team applies for med assistance?  Let me know and I will call her.  Thanks. Rush Landmark

## 2020-07-23 NOTE — Telephone Encounter (Signed)
Patient called and notified that I have referred her to uro -gyn

## 2020-07-23 NOTE — Telephone Encounter (Signed)
Called to inform pt of below and she said that she was not going to worry about it that Dr. Andria Frames was going to be sending her to another doctor.  While on the phone she said that Dr. Andria Frames was beeping in and phone call was ended.[name], CMA

## 2020-07-23 NOTE — Progress Notes (Signed)
The patient comes in today to go over the MRI of her right shoulder.  She has been having chronic issues after mechanical fall with left shoulder for some time now.  I think since 2017 she states.  However she recently felt a pop in her shoulder and had significant bruising going down her arm.  This was likely an acute proximal biceps tendon rupture.  She is still waiting to have a MRI of her thoracic and I believe lumbar spine due to severe back pain.  Today is a visit to go over the MRI of her shoulder.  She does report that her pain is subsiding some.  She does report that she had bruising all the way down into her wrist and hand and that is slowly getting better.  On exam her right shoulder is clinically located.  Her internal rotation with adduction is limited in external rotation and internal rotation with adduction is also weak.  Her liftoff is very weak.  The MRI of her right shoulder does show an acute proximal biceps tendon rupture.  She has a subacute to chronic subscapularis tendon rupture because there is retraction and muscle atrophy with no edema in the muscle to suggest an acute injury.  There is mild glenohumeral joint arthritic changes.  There is also partial tearing of the supraspinatus and infraspinatus tendons.  We are going to just have her work on a home exercise program now for her right shoulder and may consider outpatient physical therapy in a few weeks once the acute pain is coming down from her biceps tendon rupture.  She is 79 years old and we are not recommending any type of surgical intervention for now nor does she want this.  We will be seeing her in follow-up in a few weeks after she has a MRI of her spine.  We will refill her hydrocodone in the interim and I have counseled her about using this sparingly.

## 2020-07-25 ENCOUNTER — Ambulatory Visit: Payer: HMO | Admitting: Physical Therapy

## 2020-07-30 ENCOUNTER — Telehealth: Payer: Self-pay | Admitting: Orthopaedic Surgery

## 2020-07-30 ENCOUNTER — Other Ambulatory Visit: Payer: Self-pay | Admitting: Orthopaedic Surgery

## 2020-07-30 DIAGNOSIS — T148XXA Other injury of unspecified body region, initial encounter: Secondary | ICD-10-CM

## 2020-07-30 MED ORDER — HYDROCODONE-ACETAMINOPHEN 5-325 MG PO TABS: 1.0000 | ORAL_TABLET | Freq: Four times a day (QID) | ORAL | 0 refills | Status: DC | PRN

## 2020-07-30 MED ORDER — TIZANIDINE HCL 4 MG PO TABS: 4.0000 mg | ORAL_TABLET | Freq: Three times a day (TID) | ORAL | 1 refills | Status: DC | PRN

## 2020-07-30 NOTE — Telephone Encounter (Signed)
I sent them in. 

## 2020-07-30 NOTE — Telephone Encounter (Signed)
Patient called requesting refills of hydrocodone and tizanidine. Please send to [facility]. Patient is asking for a call when medications have been sent in. Patient phone number is [phone].

## 2020-07-30 NOTE — Telephone Encounter (Signed)
Please advise 

## 2020-08-07 ENCOUNTER — Telehealth: Payer: Self-pay | Admitting: Orthopaedic Surgery

## 2020-08-07 ENCOUNTER — Telehealth: Payer: Self-pay

## 2020-08-07 MED ORDER — HYDROCODONE-ACETAMINOPHEN 5-325 MG PO TABS
1.0000 | ORAL_TABLET | Freq: Four times a day (QID) | ORAL | 0 refills | Status: DC | PRN
Start: 2020-08-07 — End: 2020-08-13

## 2020-08-07 NOTE — Telephone Encounter (Signed)
Patient calls nurse line requesting to speak with Dr. [name] about starting a stronger pain medication, as she has an MRI tomorrow. Of note, patient has been receiving pain medication, hydrocodone-acetaminophen from Dr. Magnus Ivan. Per chart review, Dr. Magnus Ivan was not comfortable with starting patient on stronger medication and advised that she reach out to PCP.   Informed patient that she would likely need an appointment to discuss this further with Dr. [name].   Patient reports frustration as she states that she tried to call our office yesterday but no one ever called her back. Informed patient that I am unable to find record of phone call and apologized for issue.   Patient requested to cancel upcoming appointment with Dr. [name] as she has an appointment on the same day with Dr. Magnus Ivan. Patient states that she will call back to reschedule.   To PCP  Veronda Prude, RN

## 2020-08-07 NOTE — Telephone Encounter (Signed)
Pt called again asking about her refill

## 2020-08-07 NOTE — Telephone Encounter (Signed)
I am not comfortable with putting her on anything stronger than hydrocodone.  She can always check with her primary care physician for that but again, I would not put her on anything stronger than hydrocodone.

## 2020-08-07 NOTE — Telephone Encounter (Signed)
She states that is fine, but she would like a refill of her hydrocodone

## 2020-08-07 NOTE — Telephone Encounter (Signed)
Pt called stating she has an MRI for 08/08/20 and her pain is so intense her current pain rx isn't helping; she would like a CB to discuss having something stronger sent in and she would also like to discuss the possibility of Dr.Blackman giving her the MRI results over the phone.   [phone]

## 2020-08-08 ENCOUNTER — Ambulatory Visit
Admission: RE | Admit: 2020-08-08 | Discharge: 2020-08-08 | Disposition: A | Discharge status: Home / Self Care | Payer: HMO | Source: Ambulatory Visit | Attending: Physician Assistant | Admitting: Physician Assistant

## 2020-08-08 ENCOUNTER — Telehealth: Payer: Self-pay

## 2020-08-08 ENCOUNTER — Other Ambulatory Visit: Payer: Self-pay

## 2020-08-08 ENCOUNTER — Other Ambulatory Visit: Payer: Self-pay | Admitting: Orthopaedic Surgery

## 2020-08-08 DIAGNOSIS — M5124 Other intervertebral disc displacement, thoracic region: Secondary | ICD-10-CM | POA: Diagnosis not present

## 2020-08-08 DIAGNOSIS — M545 Low back pain, unspecified: Secondary | ICD-10-CM | POA: Diagnosis not present

## 2020-08-08 DIAGNOSIS — M549 Dorsalgia, unspecified: Secondary | ICD-10-CM

## 2020-08-08 DIAGNOSIS — M5125 Other intervertebral disc displacement, thoracolumbar region: Secondary | ICD-10-CM | POA: Diagnosis not present

## 2020-08-08 DIAGNOSIS — M50322 Other cervical disc degeneration at C5-C6 level: Secondary | ICD-10-CM | POA: Diagnosis not present

## 2020-08-08 DIAGNOSIS — M47814 Spondylosis without myelopathy or radiculopathy, thoracic region: Secondary | ICD-10-CM | POA: Diagnosis not present

## 2020-08-08 DIAGNOSIS — M48061 Spinal stenosis, lumbar region without neurogenic claudication: Secondary | ICD-10-CM | POA: Diagnosis not present

## 2020-08-08 MED ORDER — CYCLOBENZAPRINE HCL 10 MG PO TABS: 10.0000 mg | ORAL_TABLET | Freq: Three times a day (TID) | ORAL | 0 refills | Status: DC | PRN

## 2020-08-08 NOTE — Telephone Encounter (Signed)
Patient called she is having a mri this afternoon she stated he back has been spasing, she is requesting a rx to help with the spasms . [phone]

## 2020-08-08 NOTE — Telephone Encounter (Signed)
Please advise 

## 2020-08-13 ENCOUNTER — Other Ambulatory Visit: Payer: Self-pay | Admitting: Physician Assistant

## 2020-08-13 ENCOUNTER — Ambulatory Visit: Payer: HMO | Admitting: Orthopaedic Surgery

## 2020-08-13 ENCOUNTER — Telehealth: Payer: Self-pay | Admitting: Orthopaedic Surgery

## 2020-08-13 MED ORDER — HYDROCODONE-ACETAMINOPHEN 5-325 MG PO TABS
1.0000 | ORAL_TABLET | Freq: Four times a day (QID) | ORAL | 0 refills | Status: DC | PRN
Start: 2020-08-13 — End: 2020-08-16

## 2020-08-13 NOTE — Telephone Encounter (Signed)
[name] just sent in on 08/07/20.

## 2020-08-13 NOTE — Telephone Encounter (Signed)
Done

## 2020-08-13 NOTE — Progress Notes (Signed)
[facility] New Patient Evaluation and Consultation  Referring Provider: Zenia Resides, MD PCP: Zenia Resides, MD Date of Service: 08/17/2020  SUBJECTIVE Chief Complaint: New Patient (Initial Visit) and Recurrent UTI  History of Present Illness: [name] is a 80 y.o. White or Caucasian female seen in consultation at the request of Dr. Andria Frames for evaluation of urinary incontinence.    Review of records from Dr Andria Frames significant for: Reports having urinary leakage that has been affecting her quality of life.   Last Hgb A1c 05/10/20- 7.4  Urinary Symptoms: Leaks urine with cough/ sneeze, laughing, exercise, lifting, going from sitting to standing, with a full bladder, with movement to the bathroom, with urgency and while asleep. Feels that SUI = UUI Leaks 12 time(s) per day. Sometimes doesn't even know when she is leaking Pad use: 12 pads and adult diapers per day.   She is bothered by her UI symptoms.  Day time voids: "all the time".  Nocturia: 3-4 times per night to void. Voiding dysfunction: she empties her bladder well.  does not use a catheter to empty bladder.  When urinating, she feels a weak stream, dribbling after finishing, the need to urinate multiple times in a row and to push on her belly or vagina to empty bladder Drinks: 1 cup coffee in AM, 1 cup cranberry juice, 4-5 16oz bottles water per day  UTIs:  "many" but did not get cultures. She states it was due to the medication she was taking and was taken off the medication and it was better.  Reports history of blood in urine and kidney or bladder stones  Pelvic Organ Prolapse Symptoms:                  She Denies a feeling of a bulge the vaginal area.   Bowel Symptom: Bowel movements: seldom- maybe once a week Stool consistency: hard Straining: yes.  Splinting: yes.  Incomplete evacuation: yes.  She Admits to accidental bowel leakage / fecal incontinence  Occurs: often  Consistency with  leakage: soft  Bowel regimen: prune juice or prunes. Was taking a stool softener but didn't work.  Last colonoscopy: Date- about 4 years ago, Results negative  Sexual Function Sexually active: no.    Pelvic Pain Denies regular pelvic pain- reports occasional pain between the vagina and rectum.   Past Medical History:  Past Medical History:  Diagnosis Date  . Asthma   . Depression   . Diabetes mellitus without complication ([place])   . History of surgery on arm    right arm ( plate and screws)  . Hypertension   . Macular degeneration   . Migraine    without aura  . Urinary incontinence      Past Surgical History:   Past Surgical History:  Procedure Laterality Date  . ABDOMINAL HYSTERECTOMY     ?Lt. ovary remains  . APPENDECTOMY    . Arm surgery    . BREAST BIOPSY Right 10/10/2009  . BREAST CYST ASPIRATION  12/20/2013  . BREAST EXCISIONAL BIOPSY Left 1998  . BREAST EXCISIONAL BIOPSY Right 1993     Past OB/GYN History: OB History  Gravida Para Term Preterm AB Living  1 1 1     1   SAB IAB Ectopic Multiple Live Births               # Outcome Date GA Lbr Len/2nd Weight Sex Delivery Anes PTL Lv  1 Term     M Vag-Spont  Obstetric Comments  Patient has 2 children--she adopted her husband's son   Menopausal: Yes, Denies vaginal bleeding since menopause Last pap smear was prior to hysterectomy.    Medications: She has a current medication list which includes the following prescription(s): benzonatate, cyclobenzaprine, diazepam, duloxetine, enalapril, famotidine, onetouch verio, hydrocodone-acetaminophen, onetouch delica plus lancet33g, loratadine, mirabegron er, multivitamin with minerals, nortriptyline, ondansetron, rosuvastatin, ozempic (0.25 or 0.5 mg/dose), topiramate, and triamcinolone ointment.   Allergies: Patient is allergic to amoxicillin, azithromycin, butalbital, clindamycin hcl, januvia [sitagliptin], levaquin [levofloxacin in d5w], meperidine hcl,  sulfamethoxazole, buspirone, ketorolac, lidocaine, macrodantin [nitrofurantoin macrocrystal], methocarbamol, codeine phosphate, etodolac, gabapentin, naproxen, pentazocine-naloxone hcl, sumatriptan, and zonegran.   Social History:  Social History   Tobacco Use  . Smoking status: Former Smoker    Packs/day: 2.00    Years: 33.00    Pack years: 66.00    Types: Cigarettes    Start date: 08/11/1957    Quit date: 04/04/1991    Years since quitting: 29.3  . Smokeless tobacco: Never Used  Vaping Use  . Vaping Use: Never used  Substance Use Topics  . Alcohol use: No  . Drug use: No    Relationship status: married She lives with husband.   She is not employed. Regular exercise: Cannot with back pain currently  Family History:   Family History  Problem Relation Age of Onset  . Hypertension Mother   . Hypertension Father   . Hearing loss Sister   . Heart attack Sister 61       s/p 4 bypasses   . Stroke Brother        6  . Breast cancer Maternal Aunt   . Ovarian cancer Maternal Aunt      Review of Systems: Review of Systems  Constitutional: Negative for fever, malaise/fatigue and weight loss.  Respiratory: Negative for cough, shortness of breath and wheezing.   Cardiovascular: Positive for leg swelling. Negative for chest pain and palpitations.  Gastrointestinal: Negative for abdominal pain and blood in stool.  Genitourinary: Positive for dysuria.  Musculoskeletal: Negative for myalgias.  Skin: Negative for rash.  Neurological: Negative for dizziness and headaches.  Endo/Heme/Allergies: Does not bruise/bleed easily.  Psychiatric/Behavioral: Negative for depression. The patient is not nervous/anxious.      OBJECTIVE Physical Exam: Vitals:   08/17/20 1005  BP: 130/81  Pulse: (!) 111  Weight: 185 lb (83.9 kg)  Height: 4\' 9"  (1.448 m)    Physical Exam Constitutional:      General: She is not in acute distress. Pulmonary:     Effort: Pulmonary effort is normal.   Abdominal:     General: There is no distension.     Palpations: Abdomen is soft.     Tenderness: There is no abdominal tenderness. There is no rebound.  Musculoskeletal:        General: No swelling. Normal range of motion.  Skin:    General: Skin is warm and dry.     Findings: No rash.  Neurological:     Mental Status: She is alert and oriented to person, place, and time.  Psychiatric:        Mood and Affect: Mood normal.        Behavior: Behavior normal.     GU / Detailed Urogynecologic Evaluation:  Pelvic Exam: Normal external female genitalia; Bartholin's and Skene's glands normal in appearance; urethral meatus normal in appearance, no urethral masses or discharge.   CST: positive- large volume   s/p hysterectomy: Speculum exam reveals normal vaginal  mucosa with  atrophy and normal vaginal cuff.  Adnexa no mass, fullness, tenderness.     Pelvic floor strength III/V  Pelvic floor musculature: Tenderness with palpation of puborectalis. Right levator tender, Right obturator non-tender, Left levator tender, Left obturator tender  POP-Q:   POP-Q  -2                                            Aa   -2                                           Ba  -8                                              C   3                                            Gh  3.5                                            Pb  9                                            tvl   0                                            Ap  0                                            Bp                                                 D     Rectal Exam:  Normal external rectum with hemorrhoid  Post-Void Residual (PVR) by Bladder Scan: In order to evaluate bladder emptying, we discussed obtaining a postvoid residual and she agreed to this procedure.  Procedure: The ultrasound unit was placed on the patient's abdomen in the suprapubic region after the patient had voided. A PVR of 97 ml was obtained by  bladder scan.  Laboratory Results: POC urine: small leukocytes, +protein  I visualized the urine specimen, noting the specimen to be dark yellow  ASSESSMENT AND PLAN Ms. Mehrotra is a 80 y.o. with:  1. Overactive bladder   2. SUI (stress urinary incontinence, female)   3. Frequent urination   4. Constipation, unspecified constipation type   5. Levator spasm  1. OAB We discussed the symptoms of overactive bladder (OAB), which include urinary urgency, urinary frequency, nocturia, with or without urge incontinence.  While we do not know the exact etiology of OAB, several treatment options exist. We discussed management including behavioral therapy (decreasing bladder irritants, urge suppression strategies, timed voids, bladder retraining), physical therapy, medication and other therapies for refractory cases. For Beta-3 agonist medication, we discussed the potential side effect of elevated blood pressure which is more likely to occur in individuals with uncontrolled hypertension. - She will be prescribed Myrbetriq 25mg  ER to take once daily.   2. SUI For treatment of stress urinary incontinence,  non-surgical options include expectant management, weight loss, physical therapy, as well as a pessary.  Surgical options are also available.  - She would like to try physical therapy and a pessary. She will return for a pessary fitting.   3. Frequent urination - POC urine positive for leukocytes, will send for urine culture.   4. Constipation For constipation, we reviewed the importance of a better bowel regimen.   We discussed initiating therapy with increasing fluid intake, fiber supplementation, stool softeners, and laxatives such as miralax.  - She will start with fiber supplement. Recommended metamucil/ psyllium as this can also be beneficial to help prevent bowel leakage as well. Discussed that getting on a good bowel regiment and preventing constipation is imperative for preventing bowel  leakage.   5. Levator spasm - pain reproduced with palpation of pubrectalis muscle and levators. The origin of pelvic floor muscle spasm can be multifactorial, including primary, reactive to a different pain source, trauma, or even part of a centralized pain syndrome.Treatment options include pelvic floor physical therapy, local (vaginal) or oral  muscle relaxants, pelvic muscle trigger point injections or centrally acting pain medications.   - She will start with pelvic floor physical therapy- referral placed.   Return for pessary fitting.    Jaquita Folds, MD   Medical Decision Making:  - Reviewed/ ordered a clinical laboratory test - Review and summation of prior records

## 2020-08-13 NOTE — Telephone Encounter (Signed)
Please advise 

## 2020-08-13 NOTE — Telephone Encounter (Signed)
Patient called requesting a refill of hydrocodone. Please send to pharmacy on file. Patient phone number is [phone].

## 2020-08-13 NOTE — Telephone Encounter (Signed)
She's out He called in a 4-5 day supply on the 28th

## 2020-08-15 ENCOUNTER — Ambulatory Visit: Payer: HMO | Admitting: Family Medicine

## 2020-08-15 ENCOUNTER — Encounter: Payer: Self-pay | Admitting: Orthopaedic Surgery

## 2020-08-15 ENCOUNTER — Ambulatory Visit: Payer: HMO | Admitting: Orthopaedic Surgery

## 2020-08-15 DIAGNOSIS — M545 Low back pain, unspecified: Secondary | ICD-10-CM

## 2020-08-15 NOTE — Progress Notes (Signed)
The patient comes in today to go over MRIs of her thoracic and lumbar spine. She is a former patient of Dr. Lennon Alstrom who used to get injections in her back. Her problems have been at the upper lumbar spine to the right side almost just below the scapular area. She gets very significant pain in that area. She states that injections that Dr. Lennon Alstrom did under fluoroscopy used to help but then they got to where they were not helping. Her last MRI was in 2016. She still has consistent with where she describes the pain in her upper lumbar spine and lower thoracic spine to the right side.  The MRI of these areas shows a central disc protrusion at T12-L1 with some down the material that certainly potentially could irritate this area to the right side. It sounds like the injection she had were not trigger point injections in the past but were certainly a deeper type of injection. I did suggest that she try to get any of her notes from Dr. Murray Hodgkins. She is interested in having Dr. Alvester Morin evaluate her to consider an injection similar to what she has had before. For now, we will order or at least recommend an injection at the right side at the T12-L1 level.

## 2020-08-16 ENCOUNTER — Other Ambulatory Visit: Payer: Self-pay | Admitting: Orthopaedic Surgery

## 2020-08-16 ENCOUNTER — Other Ambulatory Visit: Payer: Self-pay

## 2020-08-16 ENCOUNTER — Telehealth: Payer: Self-pay | Admitting: Orthopaedic Surgery

## 2020-08-16 DIAGNOSIS — M545 Low back pain, unspecified: Secondary | ICD-10-CM

## 2020-08-16 MED ORDER — HYDROCODONE-ACETAMINOPHEN 5-325 MG PO TABS: 1.0000 | ORAL_TABLET | Freq: Four times a day (QID) | ORAL | 0 refills | Status: DC | PRN

## 2020-08-16 MED ORDER — CYCLOBENZAPRINE HCL 10 MG PO TABS: 10.0000 mg | ORAL_TABLET | Freq: Three times a day (TID) | ORAL | 0 refills | Status: DC | PRN

## 2020-08-16 NOTE — Telephone Encounter (Signed)
Patient called needing Rx refilled Hydrocodone and Cyclobenzaprine    The number to contact patient is [phone]

## 2020-08-17 ENCOUNTER — Other Ambulatory Visit: Payer: Self-pay

## 2020-08-17 ENCOUNTER — Encounter: Payer: Self-pay | Admitting: Obstetrics and Gynecology

## 2020-08-17 ENCOUNTER — Ambulatory Visit (INDEPENDENT_AMBULATORY_CARE_PROVIDER_SITE_OTHER): Payer: HMO | Admitting: Obstetrics and Gynecology

## 2020-08-17 VITALS — BP 130/81 | HR 111 | Ht <= 58 in | Wt 185.0 lb

## 2020-08-17 DIAGNOSIS — N3281 Overactive bladder: Secondary | ICD-10-CM

## 2020-08-17 DIAGNOSIS — R350 Frequency of micturition: Secondary | ICD-10-CM

## 2020-08-17 DIAGNOSIS — N3000 Acute cystitis without hematuria: Secondary | ICD-10-CM

## 2020-08-17 DIAGNOSIS — N393 Stress incontinence (female) (male): Secondary | ICD-10-CM | POA: Diagnosis not present

## 2020-08-17 DIAGNOSIS — M62838 Other muscle spasm: Secondary | ICD-10-CM | POA: Diagnosis not present

## 2020-08-17 DIAGNOSIS — K5900 Constipation, unspecified: Secondary | ICD-10-CM | POA: Diagnosis not present

## 2020-08-17 LAB — POCT URINALYSIS DIPSTICK
Appearance: NORMAL
Bilirubin, UA: NEGATIVE
Blood, UA: NEGATIVE
Glucose, UA: NEGATIVE
Ketones, UA: NEGATIVE
Nitrite, UA: NEGATIVE
Odor: ABNORMAL
Protein, UA: POSITIVE — AB
Urobilinogen, UA: 0.2 E.U./dL
pH, UA: 6 (ref 5.0–8.0)

## 2020-08-17 MED ORDER — MIRABEGRON ER 25 MG PO TB24: 25.0000 mg | ORAL_TABLET | Freq: Every day | ORAL | 5 refills | Status: DC

## 2020-08-17 NOTE — Patient Instructions (Addendum)
Constipation: Our goal is to achieve formed bowel movements daily or every-other-day.  You may need to try different combinations of the following options to find what works best for you - everybody's body works differently so feel free to adjust the dosages as needed.  Some options to help maintain bowel health include:  Marland Kitchen Dietary changes (more leafy greens, vegetables and fruits; less processed foods) . Fiber supplementation (Benefiber, FiberCon, Metamucil or Psyllium). Start slow and increase gradually to full dose. . Over-the-counter agents such as: stool softeners (Docusate or Colace) and/or laxatives (Miralax, milk of magnesia)  . "Power Pudding" is a natural mixture that may help your constipation.  To make blend 1 cup applesauce, 1 cup wheat bran, and 3/4 cup prune juice, refrigerate and then take 1 tablespoon daily with a large glass of water as needed.   We discussed the symptoms of overactive bladder (OAB), which include urinary urgency, urinary frequency, night-time urination, with or without urge incontinence.  We discussed management including behavioral therapy (decreasing bladder irritants by following a bladder diet, urge suppression strategies, timed voids, bladder retraining), physical therapy, medication; and for refractory cases posterior tibial nerve stimulation, sacral neuromodulation, and intravesical botulinum toxin injection.   For Beta-3 agonist medication, we discussed the potential side effect of elevated blood pressure which is more likely to occur in individuals with uncontrolled hypertension. You were given Myrbetriq 25 mg.  It can take a month to start working so give it time, but if you have bothersome side effects call sooner and we can try a different medication.  Call us if you have trouble filling the prescription or if it's not covered by your insurance.   For treatment of stress urinary incontinence, which is leakage with physical  activity/movement/strainging/coughing, we discussed expectant management versus nonsurgical options versus surgery. Nonsurgical options include weight loss, physical therapy, as well as a pessary.

## 2020-08-21 ENCOUNTER — Telehealth: Payer: Self-pay | Admitting: Orthopaedic Surgery

## 2020-08-21 MED ORDER — HYDROCODONE-ACETAMINOPHEN 5-325 MG PO TABS
1.0000 | ORAL_TABLET | Freq: Four times a day (QID) | ORAL | 0 refills | Status: DC | PRN
Start: 2020-08-21 — End: 2020-08-28

## 2020-08-21 MED ORDER — HYDROCODONE-ACETAMINOPHEN 5-325 MG PO TABS: 1.0000 | ORAL_TABLET | Freq: Four times a day (QID) | ORAL | 0 refills | Status: DC | PRN

## 2020-08-21 NOTE — Telephone Encounter (Signed)
Ok, done 

## 2020-08-21 NOTE — Telephone Encounter (Signed)
Pt called asking that Dr.Blackman change her rx to 1-2 tablets every 6 hrs because with it being 1 every 6 hrs the pharmacy won't release it until tomorrow and she doesn't have any medicine right now.   [phone]

## 2020-08-21 NOTE — Telephone Encounter (Signed)
Please advise 

## 2020-08-21 NOTE — Telephone Encounter (Signed)
Pt would like a refill of her norco/ vicodin 5-325 and would like to be notified when it's been called in.   [phone]

## 2020-08-22 LAB — URINE CULTURE

## 2020-08-22 MED ORDER — CEPHALEXIN 500 MG PO CAPS: 500.0000 mg | ORAL_CAPSULE | Freq: Four times a day (QID) | ORAL | 0 refills | Status: AC

## 2020-08-22 NOTE — Addendum Note (Signed)
Addended by: Jaquita Folds on: 08/22/2020 03:30 PM   Modules accepted: Orders

## 2020-08-23 ENCOUNTER — Other Ambulatory Visit: Payer: Self-pay | Admitting: Orthopaedic Surgery

## 2020-08-23 NOTE — Telephone Encounter (Signed)
Please advise 

## 2020-08-28 ENCOUNTER — Telehealth: Payer: Self-pay | Admitting: Orthopaedic Surgery

## 2020-08-28 MED ORDER — HYDROCODONE-ACETAMINOPHEN 5-325 MG PO TABS: 1.0000 | ORAL_TABLET | Freq: Four times a day (QID) | ORAL | 0 refills | Status: DC | PRN

## 2020-08-28 NOTE — Telephone Encounter (Signed)
I will send in some more, but please call her and ask her to start weening from these because we need to work toward getting her off of narcotics sooner than later.

## 2020-08-28 NOTE — Progress Notes (Addendum)
[facility]   Subjective:     Chief Complaint: Follow-up (Pessary fitting)  History of Present Illness: [name] is a 80 y.o. female with stage II pelvic organ prolapse, stress incontinence and OAB who presents today for a follow up and pessary fitting.   She started on Myrbetriq 25mg  at last visit. She started it about a week ago and has already seen a difference with less urgency and frequency. Still has to wear diaper/ pad.   She is asymptomatic from her prolapse.   Past Medical History: Patient  has a past medical history of Asthma, Depression, Diabetes mellitus without complication ([place]), History of surgery on arm, Hypertension, Macular degeneration, Migraine, and Urinary incontinence.   Past Surgical History: She  has a past surgical history that includes Appendectomy; Arm surgery; Breast cyst aspiration (12/20/2013); Breast biopsy (Right, 10/10/2009); Breast excisional biopsy (Left, 1998); Breast excisional biopsy (Right, 1993); and Abdominal hysterectomy.   Medications: She has a current medication list which includes the following prescription(s): benzonatate, cyclobenzaprine, duloxetine, enalapril, famotidine, onetouch verio, hydrocodone-acetaminophen, onetouch delica plus [id number], loratadine, mirabegron er, multivitamin with minerals, nortriptyline, ondansetron, rosuvastatin, ozempic (0.25 or 0.5 mg/dose), topiramate, and triamcinolone ointment.   Allergies: Patient is allergic to amoxicillin, azithromycin, butalbital, clindamycin hcl, januvia [sitagliptin], levaquin [levofloxacin in d5w], meperidine hcl, sulfamethoxazole, buspirone, ketorolac, lidocaine, macrodantin [nitrofurantoin macrocrystal], methocarbamol, codeine phosphate, etodolac, gabapentin, naproxen, pentazocine-naloxone hcl, sumatriptan, and zonegran.   Social History: Patient  reports that she quit smoking about 29 years ago. Her smoking use included cigarettes. She started smoking about 63  years ago. She has a 66.00 pack-year smoking history. She has never used smokeless tobacco. She reports that she does not drink alcohol and does not use drugs.      Objective:    BP (!) 161/76   Pulse (!) 108   Ht 4\' 11"  (1.499 m)   Wt 185 lb (83.9 kg)   BMI 37.37 kg/m  Gen: No apparent distress, A&O x 3. Pelvic Exam: Normal external female genitalia; Bartholin's and Skene's glands normal in appearance; urethral meatus normal in appearance, no urethral masses or discharge.   A size 1-3/4in ring with knob pessary was fitted, Lot# [id number], Exp 04/25/24. It was comfortable, stayed in place with valsalva, standing and bending and was an appropriate size on examination, with one finger fitting between the pessary and the vaginal walls.   Prior POP-Q (08/17/20):   POP-Q  -2                                            Aa   -2                                           Ba  -8                                              C   3  Gh  3.5                                            Pb  9                                            tvl   0                                            Ap  0                                            Bp                                                 D     Assessment/Plan:    Assessment: [name] is a 80 y.o. with stress incontinence and OAB who presents for follow up and a pessary fitting.  Plan:  She will continue myrbetriq 25mg  and see if she continues to have improvement of her OAB symptoms.  She was fitted with a 1-3/4in ring with knob pessary for SUI. She will keep the pessary in place until next visit.   Follow-up in 4 weeks for a follow up and pessary check or sooner as needed.  All questions were answered.    Jaquita Folds, MD  Time spent: I spent 20 minutes dedicated to the care of this patient on the date of this encounter to include pre-visit review of records, face-to-face  time with the patient and post visit documentation.

## 2020-08-28 NOTE — Telephone Encounter (Signed)
Pt aware of the below

## 2020-08-28 NOTE — Telephone Encounter (Signed)
Patient called needing Rx refilled Hydrocodone. The number to contact patient is [phone]

## 2020-08-31 ENCOUNTER — Encounter: Payer: Self-pay | Admitting: Obstetrics and Gynecology

## 2020-08-31 ENCOUNTER — Other Ambulatory Visit: Payer: Self-pay

## 2020-08-31 ENCOUNTER — Ambulatory Visit (INDEPENDENT_AMBULATORY_CARE_PROVIDER_SITE_OTHER): Payer: HMO | Admitting: Obstetrics and Gynecology

## 2020-08-31 ENCOUNTER — Other Ambulatory Visit: Payer: Self-pay | Admitting: Orthopaedic Surgery

## 2020-08-31 VITALS — BP 161/76 | HR 108 | Ht 59.0 in | Wt 185.0 lb

## 2020-08-31 DIAGNOSIS — N393 Stress incontinence (female) (male): Secondary | ICD-10-CM | POA: Diagnosis not present

## 2020-08-31 DIAGNOSIS — N3281 Overactive bladder: Secondary | ICD-10-CM | POA: Diagnosis not present

## 2020-08-31 NOTE — Patient Instructions (Signed)
You were fit with a ring with knob pessary. You are electing to leave it in place until your follow up visit. If it falls out you can try to put it back in or you can leave it out. If it seems to be slipping down you can use your finger to push it back in deeper - the deeper it is, the better fit.

## 2020-09-03 ENCOUNTER — Other Ambulatory Visit: Payer: Self-pay

## 2020-09-03 ENCOUNTER — Ambulatory Visit (INDEPENDENT_AMBULATORY_CARE_PROVIDER_SITE_OTHER): Payer: HMO | Admitting: Obstetrics and Gynecology

## 2020-09-03 ENCOUNTER — Encounter: Payer: Self-pay | Admitting: Obstetrics and Gynecology

## 2020-09-03 VITALS — BP 153/87 | HR 101 | Ht 59.0 in | Wt 185.0 lb

## 2020-09-03 DIAGNOSIS — N3941 Urge incontinence: Secondary | ICD-10-CM

## 2020-09-03 DIAGNOSIS — K5900 Constipation, unspecified: Secondary | ICD-10-CM

## 2020-09-03 DIAGNOSIS — R350 Frequency of micturition: Secondary | ICD-10-CM

## 2020-09-03 DIAGNOSIS — N393 Stress incontinence (female) (male): Secondary | ICD-10-CM

## 2020-09-03 LAB — POCT URINALYSIS DIPSTICK
Appearance: NORMAL
Bilirubin, UA: NEGATIVE
Blood, UA: NEGATIVE
Glucose, UA: NEGATIVE
Ketones, UA: NEGATIVE
Leukocytes, UA: NEGATIVE
Nitrite, UA: NEGATIVE
Protein, UA: NEGATIVE
Spec Grav, UA: 1.01 (ref 1.010–1.025)
Urobilinogen, UA: 0.2 E.U./dL
pH, UA: 6.5 (ref 5.0–8.0)

## 2020-09-03 MED ORDER — MIRABEGRON ER 50 MG PO TB24: 50.0000 mg | ORAL_TABLET | Freq: Every day | ORAL | 5 refills | Status: DC

## 2020-09-03 NOTE — Progress Notes (Signed)
[facility]   Subjective:     Chief Complaint:  Chief Complaint  Patient presents with  . Follow-up    Issues with pessary and incontinence   History of Present Illness: [name] is a 80 y.o. female with stress incontinence and OAB who presents for a pessary check. She is using a size 1-3/4in incontinence ring pessary, which was placed last week. She has been voiding/ leaking uncontrollably since having the pessary placed. She is not sure if its due to the pessary because these were similar to her symptoms prior to placement but now they have worsened.    Past Medical History: Patient  has a past medical history of Asthma, Depression, Diabetes mellitus without complication ([place]), History of surgery on arm, Hypertension, Macular degeneration, Migraine, and Urinary incontinence.   Past Surgical History: She  has a past surgical history that includes Appendectomy; Arm surgery; Breast cyst aspiration (12/20/2013); Breast biopsy (Right, 10/10/2009); Breast excisional biopsy (Left, 1998); Breast excisional biopsy (Right, 1993); and Abdominal hysterectomy.   Medications: She has a current medication list which includes the following prescription(s): cyclobenzaprine, duloxetine, enalapril, famotidine, onetouch verio, hydrocodone-acetaminophen, onetouch delica plus [id number], loratadine, mirabegron er, multivitamin with minerals, nortriptyline, ondansetron, rosuvastatin, ozempic (0.25 or 0.5 mg/dose), topiramate, benzonatate, mirabegron er, and triamcinolone ointment.   Allergies: Patient is allergic to amoxicillin, azithromycin, butalbital, clindamycin hcl, januvia [sitagliptin], levaquin [levofloxacin in d5w], meperidine hcl, sulfamethoxazole, buspirone, ketorolac, lidocaine, macrodantin [nitrofurantoin macrocrystal], methocarbamol, codeine phosphate, etodolac, gabapentin, naproxen, pentazocine-naloxone hcl, sumatriptan, and zonegran.   Social History: Patient  reports that  she quit smoking about 29 years ago. Her smoking use included cigarettes. She started smoking about 63 years ago. She has a 66.00 pack-year smoking history. She has never used smokeless tobacco. She reports that she does not drink alcohol and does not use drugs.      Objective:    Physical Exam: BP (!) 153/87   Pulse (!) 101   Ht 4\' 11"  (1.499 m)   Wt 185 lb (83.9 kg)   BMI 37.37 kg/m  Gen: No apparent distress, A&O x 3. Detailed Urogynecologic Evaluation:  Pelvic Exam: Normal external female genitalia; Bartholin's and Skene's glands normal in appearance; urethral meatus normal in appearance, no urethral masses or discharge. The pessary was noted to be in place. It was removed and cleaned and given to the patient- it was not replaced.  Straight Catheterization/ Bladder scan Procedure: Patient was attempted to provide a urine sample, but only was able to leak into her pad.Verbal consent was obtained. The bladder scan showed a PVR of 13ml to assess post void residual. The urethra and surrounding tissues were prepped with betadine and an in and out catheterization was performed.   Urine appeared dark yellow. The patient tolerated the procedure well.   Laboratory Results: Urine dipstick shows: negative for all components.    Assessment/Plan:    Assessment: [name] is a 80 y.o. with stage II pelvic organ prolapse, stress incontinence and OAB here for increased urinary leakage after pessary placement.   Plan:  - Pessary removed today as it is unclear if this is the cause of her symptom exacerbation. We will consider pessary replacement at the next visit depending on her symptoms. Reviewed alternative options for SUI including urethral bulking and midurethral sling.  - POC urine does not show any sign of infection today. She appears to be emptying her bladder well with the pessary although she is mostly leaking urine and not voiding voluntarily.  -  Will increase her Myrbetriq dose to  50mg . Discussed that her blood pressure was increased today, and had gradually increased from her last two visit. Reviewed that Myrbetriq can increase blood pressure. Patient states that she monitors her BP at home 3 times a week and it has been lower than normal. Will have her keep a log of her blood pressures (at least 3) over the next week and will call her to review them. If her BP continues to be elevated, we will need to change medications.  - Also discussed that constipation can contribute to incontinence and she has not had a BM in several days. Since miralax and stool softeners do not work well for her, and she does not like to drink thick liquids like milk of magnesia, recommended magnesium citrate supplement for constipation in addition to her prune juice.   Follow up 2 weeks.   Jaquita Folds, MD   Time Spent: Time spent: I spent 25 minutes dedicated to the care of this patient on the date of this encounter to include pre-visit review of records, face-to-face time with the patient and post visit documentation and ordering medication/ testing.

## 2020-09-03 NOTE — Patient Instructions (Addendum)
We have changed your Myrbetriq to 50mg  tablets. If you have 25mg  tablets left, you can take two together daily until you need a refill.   Please take your blood pressure at least 3 times over the next week. We will call you to review these blood pressures.   For constipation, you can try Magnesium citrate in tablet or powder form (Calm magnesium is a good brand).

## 2020-09-05 ENCOUNTER — Telehealth: Payer: Self-pay | Admitting: *Deleted

## 2020-09-05 NOTE — Telephone Encounter (Signed)
Pt called stating that she had been notified by the pharmacy that her prescription for myrbetriq 50mg  was ready for pick up.  She stated that she thought she wasn't supposed to increase from 25 to 50mg  until next week.  Advised pt that per Dr Efrain Sella notes, she was to start the 50mg  the day of her last visit.  Advised that if she had enough 25mg  tablets then she could take 2 of them and then pick up the 50mg  prescription when she runs out.  Advised we would call her on Monday to get her BP readings from the last week and then Dr. Wannetta Sender would decide whether to keep her on the myrbetriq or to switch to a different medication. Pt verbalized understanding.

## 2020-09-06 ENCOUNTER — Other Ambulatory Visit: Payer: Self-pay

## 2020-09-06 DIAGNOSIS — E119 Type 2 diabetes mellitus without complications: Secondary | ICD-10-CM

## 2020-09-06 MED ORDER — OZEMPIC (0.25 OR 0.5 MG/DOSE) 2 MG/1.5ML ~~LOC~~ SOPN: 1.0000 mg | PEN_INJECTOR | SUBCUTANEOUS | 3 refills | Status: DC

## 2020-09-10 ENCOUNTER — Telehealth: Payer: Self-pay

## 2020-09-10 ENCOUNTER — Telehealth: Payer: Self-pay | Admitting: *Deleted

## 2020-09-10 NOTE — Telephone Encounter (Signed)
LMOVM asking pt to return my call.

## 2020-09-10 NOTE — Telephone Encounter (Signed)
Will keep patient on the Myrbetriq for now. But please encourage her to follow up with her PCP since her blood pressures are not consistently controlled.

## 2020-09-10 NOTE — Telephone Encounter (Signed)
[name] is a 80 y.o. female was called re: 3 bp checks. Pt said she took her BP 3x in 2x each time. Pt also said she has no change with incontinence.  1) 165/90     160/91  2) 144/88     136/89  3) 150/90     151/95  I told the pt we will call her back once Dr. Wannetta Sender reviews her BP.

## 2020-09-10 NOTE — Telephone Encounter (Signed)
-----   Message from Jaquita Folds, MD sent at 09/03/2020 12:15 PM EST ----- Regarding: call patient Please call patient on Monday 09/10/20 to review her blood pressures over the last week (I requested at least 3 values). We discussed if BP is elevated, then we will need to change her medication (Myrbetriq).   - Thanks!

## 2020-09-11 ENCOUNTER — Ambulatory Visit: Payer: Self-pay

## 2020-09-11 ENCOUNTER — Encounter: Payer: Self-pay | Admitting: Physical Medicine and Rehabilitation

## 2020-09-11 ENCOUNTER — Telehealth: Payer: Self-pay | Admitting: *Deleted

## 2020-09-11 ENCOUNTER — Ambulatory Visit: Payer: HMO | Admitting: Physical Medicine and Rehabilitation

## 2020-09-11 ENCOUNTER — Other Ambulatory Visit: Payer: Self-pay

## 2020-09-11 VITALS — BP 132/81 | HR 109

## 2020-09-11 DIAGNOSIS — M797 Fibromyalgia: Secondary | ICD-10-CM

## 2020-09-11 DIAGNOSIS — G8929 Other chronic pain: Secondary | ICD-10-CM | POA: Diagnosis not present

## 2020-09-11 DIAGNOSIS — M5416 Radiculopathy, lumbar region: Secondary | ICD-10-CM

## 2020-09-11 DIAGNOSIS — M7918 Myalgia, other site: Secondary | ICD-10-CM | POA: Diagnosis not present

## 2020-09-11 DIAGNOSIS — R208 Other disturbances of skin sensation: Secondary | ICD-10-CM | POA: Diagnosis not present

## 2020-09-11 DIAGNOSIS — M546 Pain in thoracic spine: Secondary | ICD-10-CM

## 2020-09-11 DIAGNOSIS — M5413 Radiculopathy, cervicothoracic region: Secondary | ICD-10-CM

## 2020-09-11 DIAGNOSIS — E119 Type 2 diabetes mellitus without complications: Secondary | ICD-10-CM

## 2020-09-11 MED ORDER — CYCLOBENZAPRINE HCL 10 MG PO TABS
10.0000 mg | ORAL_TABLET | Freq: Three times a day (TID) | ORAL | 2 refills | Status: DC | PRN
Start: 2020-09-11 — End: 2020-09-28

## 2020-09-11 MED ORDER — DEXAMETHASONE SODIUM PHOSPHATE 10 MG/ML IJ SOLN
15.0000 mg | Freq: Once | INTRAMUSCULAR | Status: AC
  Administered 2020-09-11: 15 mg

## 2020-09-11 MED ORDER — OZEMPIC (1 MG/DOSE) 2 MG/1.5ML ~~LOC~~ SOPN: 1.0000 mg | PEN_INJECTOR | SUBCUTANEOUS | 6 refills | Status: DC

## 2020-09-11 NOTE — Progress Notes (Unsigned)
Pt state lower back pain that travels to her right side. Pt state walking, standing and sitting makes the pain worse. Pt state she take pain meds to help ease her pain.   Numeric Pain Rating Scale and Functional Assessment Average Pain 10   In the last MONTH (on 0-10 scale) has pain interfered with the following?  1. General activity like being  able to carry out your everyday physical activities such as walking, climbing stairs, carrying groceries, or moving a chair?  Rating(10)   +Driver, -BT, -Dye Allergies.

## 2020-09-11 NOTE — Telephone Encounter (Signed)
New Rx sent.

## 2020-09-11 NOTE — Telephone Encounter (Signed)
Received fax with message from [facility] about pts Ozempic Rx.  Note says- Rx was sent in for Ozempic 0.25mg 0.5mg  pen instead of the 1mg  pen (dose is 1mg ). PLease send in new script with correct pen. Adit Riddles Zimmerman Rumple, CMA

## 2020-09-11 NOTE — Patient Instructions (Signed)

## 2020-09-11 NOTE — Progress Notes (Signed)
[name] - 80 y.o. female MRN [id number]  Date of birth: [birth year]  Office Visit Note: Visit Date: 09/11/2020 PCP: Zenia Resides, MD Referred by: Zenia Resides, MD  Subjective: Chief Complaint  Patient presents with   Lower Back - Pain   HPI:  [name] is a 80 y.o. female who comes in today At the request of Dr. Jean Rosenthal for evaluation and management of chronic worsening severe right upper back pain around the shoulder blade area with referral to the upper shoulder.  She reports onset of symptoms around Thanksgiving of last year.  She reports a prior history of similar pain over the years for which she was seen by Dr. Marlaine Hind at [facility].  Interestingly Dr. Ninfa Linden had dictated this as well but with really talking to the patient today none of the injections Dr. Brien Few performed helped her at all.  Those seem to be some type of epidural injections as she mentions he did use an x-ray for those.  He did not complete trigger point injections.  Her pain complaints are interesting that they are at the lower inferior angle of the scapula on the right with some referral up into the right shoulder.  Nothing down the arms or down the legs.  Nothing really across the lower back.  Dr. Ninfa Linden did obtain MRI of the lumbar spine and the patient does have some multifactorial stenosis in the upper lumbar region with T12-L1 central disc protrusion but without any compression.  This really does not seem to fit with what she is complaining of.  She reports that her husband who is present today does rub a salve called "2 old goats "and that does seem to help.  They both report though even light touch across the skin is very painful.  She has not had recent physical therapy.  She does report that the symptoms were very similar to what she had in the past but they had really calmed down up until Thanksgiving.  She does not report any inciting reason  for the pain.  She has been using hydrocodone off and on and there are definitely request for refills through Dr. Ninfa Linden which she is done sparingly.  The patient does asked me today if she will receive any pain medications.  We did have a talk with her today about what we were seeing in terms of the clinical picture and it does not seem to be something that I would suggest long-term opioid treatment and obviously is not working at this point.  She has a long history of drug intolerances there is over 20 drug intolerances.  She is on duloxetine currently.  She has had a history of diagnoses of peripheral neuropathy and all manner of pain medications etc. have been intolerant to her.  She has history of migraine headache and some neurologic symptoms as well.  She has not been diagnosed with fibromyalgia.  She does have pain complaints really in all quadrants.  MRI was reviewed with the patient today with imaging and spine models.   Review of Systems  Musculoskeletal: Positive for back pain and neck pain.  Neurological: Positive for tingling.  All other systems reviewed and are negative.  Otherwise per HPI.  Assessment & Plan: Visit Diagnoses:    ICD-10-CM   1. Radiculopathy of cervicothoracic region  M54.13 dexamethasone (DECADRON) injection 15 mg    CANCELED: XR C-ARM NO REPORT    CANCELED: Epidural Steroid injection  2.  Allodynia  R20.8   3. Myofascial pain syndrome  M79.18   4. Fibromyalgia  M79.7   5. Chronic right-sided thoracic back pain  M54.6    G89.29     Plan: Findings:  1.  Chronic worsening severe right upper back pain inferior angle of the scapula with referral pattern up into the shoulder really not consistent with radicular pain although there could be some underlying cervical C5 issue that may be is causing exacerbation with allodynia.  She clearly has allodynia over this area with even light palpation.  There are focal trigger points present.  This is in the setting of a  chronic pain patient with intolerances to most medications and I think there is probably underlying fibromyalgia here.  There clearly seems to be a central sensitization pain syndrome.  I would not recommend chronic opioid treatment although if that something she seeks out then referral can be made appropriately for that.  She has had off and on hydrocodone now through Dr. Ninfa Linden other physicians over the last year or so looking at the controlled substance list.  I do not think she is abusing this in any way I does think it is really not helping and it may be actually causing more sensation and allodynia of this area.  Today we completed trigger point injection at the inferior angle of the shoulder blade into that rhomboid latissimus dorsi musculature.  We did use a small bit of cortisone.  Depending on relief I would look at referral for dry needling and physical therapy.  Likely would need to be very gentle with that.  They can continue to use the salve.  She is already taking Cymbalta so she should continue that.  She had intolerance to gabapentin could consider Lyrica.  We typically do not treat long-term fibromyalgia as a component by itself and she could seek out treatment through her primary care physician Dr. Andria Frames.    Meds & Orders:  Meds ordered this encounter  Medications   dexamethasone (DECADRON) injection 15 mg   cyclobenzaprine (FLEXERIL) 10 MG tablet    Sig: Take 1 tablet (10 mg total) by mouth 3 (three) times daily as needed. for muscle spams    Dispense:  30 tablet    Refill:  2    Orders Placed This Encounter  Procedures   Trigger Point Inj    Follow-up: Return if symptoms worsen or fail to improve.   Procedures: Trigger Point Inj  Date/Time: 09/11/2020 3:00 PM Performed by: Magnus Sinning, MD Authorized by: Magnus Sinning, MD   Consent Given by:  Patient Site marked: the procedure site was marked   Timeout: prior to procedure the correct patient, procedure, and  site was verified   Indications:  Pain Total # of Trigger Points:  3 or more Location: neck and back   Needle Size:  25 G Approach:  Dorsal Medications #1:  20 mg triamcinolone acetonide 40 MG/ML Medications #2:  3 mL lidocaine 1 % Additional Injections?: No   Patient tolerance:  Patient tolerated the procedure well with no immediate complications Comments: Trigger points palpated in the rhomboids as well as latissimus dorsi multifidus.  Patient with a history of swelling with lidocaine.  Very small amount utilized.        Clinical History: MRI LUMBAR SPINE WITHOUT CONTRAST  TECHNIQUE: Multiplanar, multisequence MR imaging of the lumbar spine was performed. No intravenous contrast was administered.  COMPARISON:  Lumbar MRI 07/02/2015  FINDINGS: Segmentation:  Normal  Alignment:  5 mm anterolisthesis L3-4.  Mild anterolisthesis L4-5  Vertebrae:  Negative for fracture or mass.  Conus medullaris and cauda equina: Conus extends to the L1-2 level. Conus and cauda equina appear normal.  Paraspinal and other soft tissues: Negative for paraspinous mass or adenopathy.  Disc levels:  T12-L1: Small central disc protrusion with down turning disc material with progression from the prior study. No significant stenosis. Mild facet degeneration.  L1-2: Progressive disc degeneration. Diffuse disc bulging and endplate spurring have progressed. Bilateral facet degeneration with progression. Mild spinal stenosis and mild subarticular stenosis bilaterally have progressed.  L2-3: Disc degeneration with diffuse disc bulging and shallow central disc protrusion with mild progression. Progressive facet hypertrophy. Moderate spinal stenosis has progressed with mild subarticular stenosis bilaterally  L3-4: Severe facet degeneration bilaterally similar to the prior study. Disc degeneration and disc bulging. Mild spinal stenosis and mild subarticular stenosis bilaterally without  significant change.  L4-5: Moderate to advanced disc degeneration with disc space narrowing and spurring, asymmetric to the right. Moderate facet degeneration also asymmetric on the right. Moderate right subarticular stenosis unchanged. Mild left subarticular stenosis  L5-S1: Mild disc and facet degeneration.  Negative for stenosis.  IMPRESSION: 1. Small central disc protrusion T12-L1 has progressed in the interval without significant stenosis 2. Mild spinal stenosis and mild subarticular stenosis bilaterally L1-2 have progressed. 3. Progressive disc and facet degeneration L2-3. Moderate spinal stenosis has progressed in the interval. 4. Mild spinal stenosis L3-4 unchanged 5. Moderate right subarticular stenosis due to spurring at L4-5 unchanged.   Electronically Signed   By: Franchot Gallo M.D.   On: 08/08/2020 16:10     Objective:  VS:  HT:     WT:    BMI:      BP:132/81   HR:(!) 109bpm   TEMP: ( )   RESP:  Physical Exam Vitals and nursing note reviewed.  Constitutional:      General: She is not in acute distress.    Appearance: Normal appearance. She is obese. She is not ill-appearing.  HENT:     Head: Normocephalic and atraumatic.     Right Ear: External ear normal.     Left Ear: External ear normal.  Eyes:     Extraocular Movements: Extraocular movements intact.  Cardiovascular:     Rate and Rhythm: Normal rate.     Pulses: Normal pulses.  Pulmonary:     Effort: Pulmonary effort is normal. No respiratory distress.  Abdominal:     General: There is no distension.     Palpations: Abdomen is soft.  Musculoskeletal:        General: Tenderness present.     Cervical back: Neck supple.     Right lower leg: No edema.     Left lower leg: No edema.     Comments: Patient has good distal strength with no pain over the greater trochanters.  No clonus or focal weakness.  Pain over the right upper back with even light palpation.  Positive for allodynia.  No swelling  induration or discoloration or rashing.  Mild shoulder impingement but not reproductive of her pain.  She has some reduced range of motion of the cervical spine with rotation.  She has good strength in the upper extremities bilaterally without deficit negative Hoffmann's test.  She does seem to have tender points in multiple areas across the upper back on both sides.  Skin:    Findings: No erythema, lesion or rash.  Neurological:     General: No  focal deficit present.     Mental Status: She is alert and oriented to person, place, and time.     Cranial Nerves: Cranial nerve deficit present.     Sensory: No sensory deficit.     Motor: No weakness or abnormal muscle tone.     Coordination: Coordination normal.     Gait: Gait normal.  Psychiatric:        Mood and Affect: Mood normal.        Behavior: Behavior normal.      Imaging: No results found.

## 2020-09-12 ENCOUNTER — Encounter: Payer: Self-pay | Admitting: Physical Medicine and Rehabilitation

## 2020-09-12 MED ORDER — TRIAMCINOLONE ACETONIDE 40 MG/ML IJ SUSP
20.0000 mg | INTRAMUSCULAR | Status: AC | PRN
  Administered 2020-09-11: 20 mg via INTRAMUSCULAR

## 2020-09-12 MED ORDER — LIDOCAINE HCL 1 % IJ SOLN
3.0000 mL | INTRAMUSCULAR | Status: AC | PRN
  Administered 2020-09-11: 3 mL

## 2020-09-13 ENCOUNTER — Telehealth: Payer: Self-pay | Admitting: Physical Medicine and Rehabilitation

## 2020-09-13 NOTE — Telephone Encounter (Signed)
Patient had trigger point injection yesterday. She states that you wanted to see her back in a week. She states that the injection did not help and she is in as much pain as before if not more. Please advise.

## 2020-09-13 NOTE — Telephone Encounter (Signed)
Patient called requesting a call back from [place] or [place]. Patient states she received an injection and is having problems. Please call patient at [phone].

## 2020-09-13 NOTE — Telephone Encounter (Signed)
I wanted to know how it felt after a week. If no better in a few days then f/up with Dr. Ninfa Linden as they may want to refer to Pain Mgt.  I have sent a note to Dr. Ninfa Linden. I do no think this is a spine issue.

## 2020-09-13 NOTE — Telephone Encounter (Signed)
Called patient to advise  °

## 2020-09-14 ENCOUNTER — Telehealth: Payer: Self-pay | Admitting: Physical Medicine and Rehabilitation

## 2020-09-14 NOTE — Telephone Encounter (Signed)
Pt husband [name] called and would like to speak with a nurse about his wife. His wife talked to one of you yesterday but was a little confused about whatever was talked about. Please call and speak with him [phone]

## 2020-09-17 ENCOUNTER — Telehealth: Payer: Self-pay | Admitting: Orthopaedic Surgery

## 2020-09-17 ENCOUNTER — Other Ambulatory Visit: Payer: Self-pay | Admitting: Family Medicine

## 2020-09-17 ENCOUNTER — Telehealth: Payer: Self-pay

## 2020-09-17 DIAGNOSIS — G8929 Other chronic pain: Secondary | ICD-10-CM

## 2020-09-17 MED ORDER — HYDROCODONE-ACETAMINOPHEN 5-325 MG PO TABS: 1.0000 | ORAL_TABLET | Freq: Four times a day (QID) | ORAL | 0 refills | Status: DC | PRN

## 2020-09-17 NOTE — Telephone Encounter (Signed)
Patient called she stated she is a patient of Dr.Blackman and she was referred to Dr.Newton and then she was referred back to Dr.Blackman she would like to know why she is being sent back and forth between the two providers she would like a call [phone].

## 2020-09-17 NOTE — Progress Notes (Signed)
[facility] Return Visit  SUBJECTIVE  History of Present Illness: [name] is a 80 y.o. female seen in follow-up for Grant. At last visit, her Grant ring pessary was removed since this caused uncontrollable leakage. [name] was then increased to 50mg .   She has seen a significant improvement with the increased dose of [name]. She is changing pads a lot less frequently and is going to the bathroom less overall. She is able to hold it on the way to the bathroom. She is sleeping through the night without having to urinate.   Has noticed a strong odor to her urine and she is wondering if she has a UTI. She denies any dysuria.   Still has leakage with coughing and sneezing. She does not want the pessary replaced at this time since she has been having good results with just the medication.   Has been under a lot of stress daily (husband has cancer) and says her BP sometimes is elevated with this. Has not talked to her PCP.   Past Medical History: Patient  has a past medical history of [name], [name], [name] mellitus without complication ([name]), History of Grant on Debra, [name], [name], [name], and [name].   Past Surgical History: She  has a past surgical history that includes [name]; [name]; [name] (12/20/2013); [name] (Right, 10/10/2009); Debra excisional Grant (Left, 1998); Debra excisional Grant (Right, 1993); and [name].   Medications: She has a current medication list which includes the following prescription(s): benzonatate, cyclobenzaprine, duloxetine, enalapril, famotidine, onetouch verio, hydrocodone-acetaminophen, onetouch delica plus [id number], loratadine, mirabegron er, mirabegron er, multivitamin with minerals, nortriptyline, ondansetron, rosuvastatin, ozempic (1 mg/dose), topiramate, and triamcinolone ointment.   Allergies: Patient is allergic to  amoxicillin, azithromycin, butalbital, clindamycin hcl, januvia [sitagliptin], levaquin [levofloxacin in d5w], meperidine hcl, sulfamethoxazole, buspirone, ketorolac, lidocaine, macrodantin [nitrofurantoin macrocrystal], methocarbamol, codeine phosphate, etodolac, gabapentin, naproxen, pentazocine-naloxone hcl, sumatriptan, and zonegran.   Social History: Patient  reports that she quit smoking about 29 years ago. Her smoking use included cigarettes. She started smoking about 63 years ago. She has a 66.00 pack-year smoking history. She has never used smokeless tobacco. She reports that she does not drink alcohol and does not use drugs.      OBJECTIVE     Physical Exam: Vitals:   09/19/20 1512  BP: (!) 148/84  Pulse: 98  Weight: 177 lb (80.3 kg)  Height: 4\' 11"  (1.499 m)   Gen: No apparent distress, A&O x 3.  Detailed Urogynecologic Evaluation:  Deferred.    POC urine: negative for all components   ASSESSMENT AND PLAN    [name] is a 80 y.o. with:  1. Overactive bladder   2. Frequent urination   3. SUI (stress [name], female)    1. OAB- improved - She will continue with the [name] 50mg  daily as she has seen improvement with this dose.  - no sign of UTI today on POC urine  2. SUI - She does not want the pessary replaced.  - Discussed additional options for treatment including urethral bulking or midurethral sling. She prefers to defer treatment at this time.   She will touch base with her PCP regarding her blood pressures- today is in an acceptable range.   Follow up 2-3 months   Debra Folds, MD   Time spent: I spent 20 minutes dedicated to the care of this patient on the date of this encounter to include pre-visit review of records, face-to-face time  with the patient and post visit documentation.

## 2020-09-17 NOTE — Telephone Encounter (Signed)
Called patient to advise per 2/3 phone call.

## 2020-09-17 NOTE — Telephone Encounter (Signed)
Dr. Ernestina Patches was recommending that she follow up with Dr. Ninfa Linden because he does not think it is a spine issue.

## 2020-09-17 NOTE — Telephone Encounter (Signed)
Appt scheduled with [place]

## 2020-09-17 NOTE — Telephone Encounter (Signed)
Patient called requesting pain medication. Patient states to be in severe pains and appt is not until next week. [facility]. Patient phone number is [phone].

## 2020-09-17 NOTE — Telephone Encounter (Signed)
Patient states the injection didn't help at all Do you guys want to see her again or want her to see Ninfa Linden first?

## 2020-09-19 ENCOUNTER — Ambulatory Visit (INDEPENDENT_AMBULATORY_CARE_PROVIDER_SITE_OTHER): Payer: HMO | Admitting: Obstetrics and Gynecology

## 2020-09-19 ENCOUNTER — Encounter: Payer: Self-pay | Admitting: Obstetrics and Gynecology

## 2020-09-19 ENCOUNTER — Other Ambulatory Visit: Payer: Self-pay

## 2020-09-19 VITALS — BP 148/84 | HR 98 | Ht 59.0 in | Wt 177.0 lb

## 2020-09-19 DIAGNOSIS — N3281 Overactive bladder: Secondary | ICD-10-CM | POA: Diagnosis not present

## 2020-09-19 DIAGNOSIS — R350 Frequency of micturition: Secondary | ICD-10-CM | POA: Diagnosis not present

## 2020-09-19 DIAGNOSIS — N393 Stress incontinence (female) (male): Secondary | ICD-10-CM | POA: Diagnosis not present

## 2020-09-19 LAB — POCT URINALYSIS DIPSTICK
Appearance: NORMAL
Bilirubin, UA: NEGATIVE
Blood, UA: NEGATIVE
Glucose, UA: NEGATIVE
Ketones, UA: NEGATIVE
Leukocytes, UA: NEGATIVE
Nitrite, UA: NEGATIVE
Protein, UA: NEGATIVE
Spec Grav, UA: 1.02 (ref 1.010–1.025)
Urobilinogen, UA: 0.2 E.U./dL
pH, UA: 5 (ref 5.0–8.0)

## 2020-09-25 ENCOUNTER — Ambulatory Visit: Payer: HMO | Admitting: Orthopaedic Surgery

## 2020-09-25 ENCOUNTER — Encounter: Payer: Self-pay | Admitting: Orthopaedic Surgery

## 2020-09-25 DIAGNOSIS — M545 Low back pain, unspecified: Secondary | ICD-10-CM | POA: Diagnosis not present

## 2020-09-25 DIAGNOSIS — M25511 Pain in right shoulder: Secondary | ICD-10-CM

## 2020-09-25 DIAGNOSIS — G8929 Other chronic pain: Secondary | ICD-10-CM

## 2020-09-25 DIAGNOSIS — S46211D Strain of muscle, fascia and tendon of other parts of biceps, right arm, subsequent encounter: Secondary | ICD-10-CM | POA: Diagnosis not present

## 2020-09-25 MED ORDER — PREGABALIN 75 MG PO CAPS: 75.0000 mg | ORAL_CAPSULE | Freq: Two times a day (BID) | ORAL | 3 refills | Status: DC

## 2020-09-25 NOTE — Progress Notes (Signed)
The patient returns for follow-up after having a trigger point injection by Dr. Ernestina Patches in the parascapular area.  This did not really help her.  She is to be a patient of Dr. Brien Few.  He provided 1 injection did help within a guide toward none of his injections were helping either.  She is 80 years old.  She has been dealing with severe pain in this area now for months.  She is on hydrocodone and I spoke to her and her husband about trying to wean this.  At this point, I feel that she would benefit from seeing a chiropractor since she has not seen him in a while.  Since massaging this area and compression helps ease her pain, she would benefit from an over-the-counter lumbar corset which may take some pressure off this area.  She may even benefit from a TENS unit.  She cannot tolerate Neurontin at all so at this point I feel that she would benefit from Lyrica to try to modulate the pain.  On exam she still hurts in the parascapular and mid thoracic area to the right side.  Her right shoulder moves well overall and she has excellent strength in her biceps given her biceps tendon rupture from several months ago.  We had a long thorough discussion about trying to wean from pain medications.  I feel that we have a good plan in terms of trying Lyrica combined with chiropractic treatment and a back support brace.  We will continue to try to wean her hydrocodone.  She is interested also in a pain management referral which I think is reasonable.

## 2020-09-26 ENCOUNTER — Other Ambulatory Visit: Payer: Self-pay

## 2020-09-26 ENCOUNTER — Telehealth: Payer: Self-pay | Admitting: Physician Assistant

## 2020-09-26 ENCOUNTER — Telehealth: Payer: Self-pay | Admitting: Orthopaedic Surgery

## 2020-09-26 DIAGNOSIS — M545 Low back pain, unspecified: Secondary | ICD-10-CM

## 2020-09-26 DIAGNOSIS — M549 Dorsalgia, unspecified: Secondary | ICD-10-CM

## 2020-09-26 DIAGNOSIS — M25511 Pain in right shoulder: Secondary | ICD-10-CM

## 2020-09-26 DIAGNOSIS — G8929 Other chronic pain: Secondary | ICD-10-CM

## 2020-09-26 MED ORDER — HYDROCODONE-ACETAMINOPHEN 5-325 MG PO TABS: 1.0000 | ORAL_TABLET | Freq: Three times a day (TID) | ORAL | 0 refills | Status: DC | PRN

## 2020-09-26 NOTE — Telephone Encounter (Signed)
Patient had Rhonda Investment banker, corporate) from [employer] asking if she can stop taking pregablin due to medication making her dizzy and unable to walk. Renato Gails. Gave instruction to continue cyclobenzaprine. Also notified patient referral has been sent for pain management. No call back needed.

## 2020-09-26 NOTE — Telephone Encounter (Signed)
Second part of message. Patient is stating that she ran out of hydrocodone. Patient states that Dr. Ninfa Linden told patient he needs her to whine off of hydrocodone. Patient has been taking every 6 hours and asking to whine off medication can she have a smaller dosage or take every 8 yours. Patient is asking for a refill of hydrocodone. Please call Jonelle at [phone]. Suanne Marker states if need to speak to her you can call [phone].

## 2020-09-27 ENCOUNTER — Other Ambulatory Visit: Payer: Self-pay | Admitting: Physical Medicine and Rehabilitation

## 2020-09-28 ENCOUNTER — Telehealth: Payer: Self-pay | Admitting: Orthopaedic Surgery

## 2020-09-28 ENCOUNTER — Ambulatory Visit: Payer: HMO | Admitting: Obstetrics and Gynecology

## 2020-09-28 NOTE — Telephone Encounter (Signed)
Patient aware that unfortunately this can take weeks to get an appt with

## 2020-09-28 NOTE — Telephone Encounter (Signed)
Pt husband called and wants to know when pain management is suppose to call and/or he wanted the number to call them. He would also to know if his wife is going to be weaned off the pain medication because she is in a lot of pain. CB [phone]

## 2020-09-28 NOTE — Telephone Encounter (Signed)
Please advise 

## 2020-10-03 ENCOUNTER — Other Ambulatory Visit: Payer: Self-pay

## 2020-10-03 ENCOUNTER — Telehealth: Payer: Self-pay

## 2020-10-03 DIAGNOSIS — M549 Dorsalgia, unspecified: Secondary | ICD-10-CM

## 2020-10-03 DIAGNOSIS — M545 Low back pain, unspecified: Secondary | ICD-10-CM

## 2020-10-03 NOTE — Telephone Encounter (Signed)
I am fine with outpatient therapy for her to work on her balance and coordination.  She also needs a chronic pain management referral because she understands that I will not keep her on these medications much longer.

## 2020-10-03 NOTE — Telephone Encounter (Signed)
Patient aware.

## 2020-10-03 NOTE — Telephone Encounter (Signed)
Patient stating that she has fallen a couple times and her "insurance case manager/nurse" called asking if patient can get outpatient therapy to help with this? I told her that taking the Flexeril and hydrocodone could very well be contributing to her fall, but she states she is a 9 out of 10 pain and she does not want to stop taking her medication

## 2020-10-04 ENCOUNTER — Encounter: Payer: Self-pay | Admitting: Physical Medicine and Rehabilitation

## 2020-10-08 ENCOUNTER — Telehealth: Payer: Self-pay

## 2020-10-08 ENCOUNTER — Other Ambulatory Visit: Payer: Self-pay | Admitting: Orthopaedic Surgery

## 2020-10-08 MED ORDER — HYDROCODONE-ACETAMINOPHEN 5-325 MG PO TABS: 1.0000 | ORAL_TABLET | Freq: Two times a day (BID) | ORAL | 0 refills | Status: DC | PRN

## 2020-10-08 MED ORDER — CYCLOBENZAPRINE HCL 10 MG PO TABS: 10.0000 mg | ORAL_TABLET | Freq: Two times a day (BID) | ORAL | 2 refills | Status: DC | PRN

## 2020-10-08 NOTE — Telephone Encounter (Signed)
She should have an appt with pain mgmt in the near future  She's asking you fill it until that time

## 2020-10-08 NOTE — Telephone Encounter (Signed)
I will send in hydrocodone just this 1 more time.  She needs to understand that.  Please call her and let her know that I cannot keep her on hydrocodone anymore after this last time.  I sent in the muscle relaxant as well.

## 2020-10-08 NOTE — Telephone Encounter (Signed)
Please advise 

## 2020-10-08 NOTE — Telephone Encounter (Signed)
Patient called she is requesting rx refill for cyclobenzaprine and hydrocodone she is requesting rx to be sent to [facility], [address]   call [phone]

## 2020-10-09 ENCOUNTER — Telehealth: Payer: Self-pay | Admitting: Orthopaedic Surgery

## 2020-10-09 NOTE — Telephone Encounter (Signed)
Patient called requesting a phone call from Renato Gails., PA Carlis Abbott, or Dr. Ninfa Linden. Patient has medical questions. Please call patient at [phone].

## 2020-10-10 NOTE — Telephone Encounter (Signed)
Asking if she has to go to PT and the chiropractor, I told her she can do one or the other, we are just trying to help her pain

## 2020-10-16 ENCOUNTER — Ambulatory Visit: Payer: HMO

## 2020-10-23 ENCOUNTER — Other Ambulatory Visit: Payer: Self-pay

## 2020-10-23 ENCOUNTER — Encounter: Payer: Self-pay | Admitting: Physician Assistant

## 2020-10-23 ENCOUNTER — Ambulatory Visit: Payer: HMO | Admitting: Physician Assistant

## 2020-10-23 DIAGNOSIS — M545 Low back pain, unspecified: Secondary | ICD-10-CM | POA: Diagnosis not present

## 2020-10-23 MED ORDER — HYDROCODONE-ACETAMINOPHEN 5-325 MG PO TABS: 1.0000 | ORAL_TABLET | Freq: Two times a day (BID) | ORAL | 0 refills | Status: DC | PRN

## 2020-10-23 NOTE — Progress Notes (Signed)
HPI: Mrs. Hogan returns today follow-up of her mid right back pain.  Again this been ongoing since November.  She states she is had no improvement.  She did not pick up the Lyrica as she was not aware that she needed to pick up the new prescription.  She does have a chiropractor appointment scheduled for the week.  Also pain management 22.  She tried the back brace with out any relief and in fact she feels that it may have made her symptoms worse.  Physical exam: Tenderness paraspinous midthoracic area on the right.  There is no rashes skin lesions ulcerations over this area.  Impression: Right low back pain without radicular symptoms  Plan we will have her pick up the Lyrica and begin taking this 75 mg twice daily.  She will follow-up with her chiropractor this week and then the pain management clinic.  Did refill her hydrocodone.  She will follow-up with [place] as needed.  Questions encouraged and answered

## 2020-10-24 ENCOUNTER — Ambulatory Visit: Payer: HMO | Attending: Orthopaedic Surgery | Admitting: Physical Therapy

## 2020-11-01 ENCOUNTER — Other Ambulatory Visit: Payer: Self-pay | Admitting: Family Medicine

## 2020-11-01 DIAGNOSIS — E119 Type 2 diabetes mellitus without complications: Secondary | ICD-10-CM

## 2020-11-02 ENCOUNTER — Telehealth: Payer: Self-pay | Admitting: Family Medicine

## 2020-11-02 NOTE — Telephone Encounter (Signed)
Patient dropped off form for Ozemic assistance per patient Dr Andria Frames has other pages that she needs 3 and 4. Any questions please call patients.  Put paperwork she gave me in [facility] folder for Dr Andria Frames

## 2020-11-05 NOTE — Telephone Encounter (Signed)
Spoke to Dr Andria Frames about paperwork. Dr. Andria Frames asked for me to talk to Dr Valentina Lucks and ask if they have pages 3 and 4. Spoke to Dr. Valentina Lucks. He will handle everything from here. Let Dr. Valentina Lucks know that pt only has 1 week of medication left and will need more to get her through until the company sends medication. Dr Valentina Lucks said he would give her a call. Ottis Stain, CMA

## 2020-11-05 NOTE — Telephone Encounter (Signed)
I greatly appreciate the teamwork

## 2020-11-06 ENCOUNTER — Telehealth: Payer: Self-pay

## 2020-11-06 NOTE — Telephone Encounter (Signed)
Medication Samples have been provided to the patient.  Drug name: OZEMPIC       Strength: 2MG /1.5ML        Qty: 3 BOXES  LOT: [id number]  Exp.Date: 02/08/2023  Dosing instructions: INJECT 1MG  ONCE WEEKLY   Patients Novo Nordisk application is pending. Will reach out once approved.   Vista Deck 9:01 AM 11/06/2020

## 2020-11-06 NOTE — Telephone Encounter (Signed)
Preparing a sample for patient today, will call her when ready. Also MD pages will be in Dr Lowella Bandy box to sign. Thanks!!

## 2020-11-06 NOTE — Progress Notes (Signed)
Submitted application for OZEMPIC 3ML PEN to [place] for patient assistance.   Phone: [phone]

## 2020-11-06 NOTE — Telephone Encounter (Signed)
Noted and agree. 

## 2020-11-09 NOTE — Telephone Encounter (Signed)
Patient LVM on nurse line stating she will come back next week to pick up Ozempic samples.

## 2020-11-13 ENCOUNTER — Encounter: Payer: HMO | Admitting: Physical Therapy

## 2020-11-15 ENCOUNTER — Encounter: Payer: HMO | Attending: Physical Medicine and Rehabilitation | Admitting: Physical Medicine and Rehabilitation

## 2020-11-15 ENCOUNTER — Other Ambulatory Visit: Payer: Self-pay

## 2020-11-15 VITALS — BP 123/76 | HR 100 | Temp 98.8°F | Ht <= 58 in | Wt 179.0 lb

## 2020-11-15 DIAGNOSIS — M7918 Myalgia, other site: Secondary | ICD-10-CM

## 2020-11-15 DIAGNOSIS — M47816 Spondylosis without myelopathy or radiculopathy, lumbar region: Secondary | ICD-10-CM | POA: Diagnosis not present

## 2020-11-15 MED ORDER — METHOCARBAMOL 500 MG PO TABS: 500.0000 mg | ORAL_TABLET | Freq: Three times a day (TID) | ORAL | 1 refills | Status: DC | PRN

## 2020-11-15 NOTE — Progress Notes (Signed)
Subjective:    Patient ID: [name], female    DOB: 1940/11/03, 80 y.o.   MRN: [id number]  HPI  1) thoracic myofascial pain [name] is a 80 year old woman who presents to establish care for right sided thoracic muscle pain and bilateral lumbar facet arthritis.  -heat does not help -sometimes if will move higher or lower -she has been using three goats  -she has swelling in response to methocarbamol, but chart indicates that this was due to receiving multiple new medications at the same time and she is willing to try again.   2) Bilateral lumbar facet arthrosis: -she has low back pain for a long time -is worse with standing and walking  -she is unable to do therapy as she is currently doing therapy for incontinence.    Pain Inventory Average Pain 9 Pain Right Now 10 My pain is sharp, stabbing and tingling  In the last 24 hours, has pain interfered with the following? General activity 8 Relation with others 9 Enjoyment of life 9 What TIME of day is your pain at its worst? morning , evening and night Sleep (in general) Fair  Pain is worse with: walking, bending, standing and some activites Pain improves with: heat/ice, therapy/exercise and pacing activities Relief from Meds: 4  walk without assistance how many minutes can you walk? 2 ability to climb steps?  no do you drive?  no  retired I need assistance with the following:  meal prep, household duties and shopping  bladder control problems bowel control problems trouble walking spasms  New pt  New pt    Family History  Problem Relation Age of Onset  . Hypertension Mother   . Hypertension Father   . Hearing loss Sister   . Heart attack Sister 23       s/p 4 bypasses   . Stroke Brother        27  . Breast cancer Maternal Aunt   . Ovarian cancer Maternal Aunt    Social History   Socioeconomic History  . Marital status: Married    Spouse name: Not on file  . Number of children: Not on file   . Years of education: Not on file  . Highest education level: Not on file  Occupational History  . Not on file  Tobacco Use  . Smoking status: Former Smoker    Packs/day: 2.00    Years: 33.00    Pack years: 66.00    Types: Cigarettes    Start date: 08/11/1957    Quit date: 04/04/1991    Years since quitting: 29.6  . Smokeless tobacco: Never Used  Vaping Use  . Vaping Use: Never used  Substance and Sexual Activity  . Alcohol use: No  . Drug use: No  . Sexual activity: Not Currently  Other Topics Concern  . Not on file  Social History Narrative  . Not on file   Social Determinants of Health   Financial Resource Strain: Not on file  Food Insecurity: Not on file  Transportation Needs: Not on file  Physical Activity: Not on file  Stress: Not on file  Social Connections: Not on file   Past Surgical History:  Procedure Laterality Date  . ABDOMINAL HYSTERECTOMY     ?Lt. ovary remains  . APPENDECTOMY    . Arm surgery    . BREAST BIOPSY Right 10/10/2009  . BREAST CYST ASPIRATION  12/20/2013  . BREAST EXCISIONAL BIOPSY Left 1998  . BREAST EXCISIONAL BIOPSY  Right 1993   Past Medical History:  Diagnosis Date  . Asthma   . Depression   . Diabetes mellitus without complication ([place])   . History of surgery on arm    right arm ( plate and screws)  . Hypertension   . Macular degeneration   . Migraine    without aura  . Urinary incontinence    BP 123/76   Pulse 100   Temp 98.8 F (37.1 C)   Ht 4' 9.5" (1.461 m)   Wt 179 lb (81.2 kg)   SpO2 90%   BMI 38.06 kg/m   Opioid Risk Score:   Fall Risk Score:  `1  Depression screen PHQ 2/9  Depression screen [facility] 2/9 11/15/2020 07/19/2020 06/04/2020 05/10/2020 01/16/2020 01/16/2020 11/16/2019  Decreased Interest 3 3 0 0 0 0 0  Down, Depressed, Hopeless 0 1 0 0 0 0 0  PHQ - 2 Score 3 4 0 0 0 0 0  Altered sleeping 0 0 0 1 - - -  Tired, decreased energy 0 3 1 1  - - -  Change in appetite 0 3 0 1 - - -  Feeling bad or failure about  yourself  0 2 0 0 - - -  Trouble concentrating 0 2 0 0 - - -  Moving slowly or fidgety/restless 0 3 0 0 - - -  Suicidal thoughts 0 - 0 0 - - -  PHQ-9 Score 3 17 1 3  - - -  Difficult doing work/chores Not difficult at all - Somewhat difficult - - - -  Some recent data might be hidden    Review of Systems  Musculoskeletal: Positive for back pain and gait problem.       Spasms  All other systems reviewed and are negative.      Objective:   Physical Exam  Gen: no distress, normal appearing HEENT: oral mucosa pink and moist, NCAT Cardio: Reg rate Chest: normal effort, normal rate of breathing Abd: soft, non-distended Ext: no edema Psych: pleasant, normal affect Skin: intact Neuro: Alert and oriented x3 Musculoskeletal: Pain reproduced with lumbar extension and standing, tenderness to palpation over bilateral lower lumbar paraspinals. Negative slump test bilaterally.  Assessment & Plan:  [name] is a 80 year old woman who presents to establish care for the following conditions:  1) right sided thoracic muscle spasm: -prescribed robaxin 500mg  q8H prn. Discussed listed allergy of swelling. This was when she took ketorolac and lidocaine patch at same time so she is willing to try again with just this medication. -recommend applying blue emu oil  2) bilateral lumbar facet arthrosis: --MBB b/l is indicated for the following levels: b/l L4-L5 and L5-S1. The patient has severe low back pain that affects QOL. The patient has failed 6 months of conservative care with physical therapy (for a different issue, she cannot due for back since only permitted to target one body area at a time), medications, activity and lifestyle modification. MRI was performed and shows severe facet arthropathy at these levels. The patient has never had MBB before.

## 2020-11-15 NOTE — Patient Instructions (Signed)
Blue emu oil 

## 2020-11-20 ENCOUNTER — Encounter: Payer: Self-pay | Admitting: Physical Therapy

## 2020-11-20 ENCOUNTER — Encounter: Payer: HMO | Attending: Obstetrics and Gynecology | Admitting: Physical Therapy

## 2020-11-20 ENCOUNTER — Other Ambulatory Visit: Payer: Self-pay

## 2020-11-20 ENCOUNTER — Telehealth: Payer: Self-pay

## 2020-11-20 DIAGNOSIS — R32 Unspecified urinary incontinence: Secondary | ICD-10-CM | POA: Insufficient documentation

## 2020-11-20 DIAGNOSIS — K5900 Constipation, unspecified: Secondary | ICD-10-CM | POA: Diagnosis not present

## 2020-11-20 DIAGNOSIS — R252 Cramp and spasm: Secondary | ICD-10-CM | POA: Diagnosis not present

## 2020-11-20 DIAGNOSIS — R278 Other lack of coordination: Secondary | ICD-10-CM | POA: Diagnosis not present

## 2020-11-20 DIAGNOSIS — M6281 Muscle weakness (generalized): Secondary | ICD-10-CM

## 2020-11-20 NOTE — Telephone Encounter (Signed)
Noted and agree. 

## 2020-11-20 NOTE — Telephone Encounter (Signed)
Received notification from [place] regarding approval for OZEMPIC 4MG . Patient assistance approved from 11/20/20 to 08/10/21.  Medication is on auto refill and will ship to [facility] for a 4 month supply.  Phone: [phone]

## 2020-11-20 NOTE — Therapy (Signed)
[facility] at [facility] [address] [place], [place], [address] Phone: [phone]   Fax:  [phone]  Physical Therapy Evaluation  Patient Details  Name: [name] MRN: [id number] Date of Birth: 03/09/1941 Referring Provider (PT): Dr. Sherlene Shams   Encounter Date: 11/20/2020   PT End of Session - 11/20/20 2100    Visit Number 1    Date for PT Re-Evaluation 02/12/21    Authorization Type [employer]    PT Start Time 1345    PT Stop Time 1415    PT Time Calculation (min) 30 min    Activity Tolerance Patient tolerated treatment well;No increased pain    Behavior During Therapy WFL for tasks assessed/performed           Past Medical History:  Diagnosis Date  . Asthma   . Depression   . Diabetes mellitus without complication ([place])   . History of surgery on arm    right arm ( plate and screws)  . Hypertension   . Macular degeneration   . Migraine    without aura  . Urinary incontinence     Past Surgical History:  Procedure Laterality Date  . ABDOMINAL HYSTERECTOMY     ?Lt. ovary remains  . APPENDECTOMY    . Arm surgery    . BREAST BIOPSY Right 10/10/2009  . BREAST CYST ASPIRATION  12/20/2013  . BREAST EXCISIONAL BIOPSY Left 1998  . BREAST EXCISIONAL BIOPSY Right 1993    There were no vitals filed for this visit.    Subjective Assessment - 11/20/20 1352    Subjective Patinet has had issues with urinary leakage for years.    Patient Stated Goals reduce leakage    Currently in Pain? Yes    Pain Score 9     Pain Location Pelvis    Pain Orientation Mid    Pain Descriptors / Indicators Sharp    Pain Onset More than a month ago    Pain Frequency Intermittent    Aggravating Factors  suddenly happens    Pain Relieving Factors sit on toilet sometimes helps to reduce the pain    Multiple Pain Sites No              [facility] PT Assessment - 11/20/20 0001      Assessment   Medical  Diagnosis N32.81 Overactive Bladder; N39.3 SUI; K59 Constipation, unspecified constipation type    Referring Provider (PT) Dr. Sherlene Shams    Prior Therapy PT for back      Precautions   Precautions None      Restrictions   Weight Bearing Restrictions No      Balance Screen   Has the patient fallen in the past 6 months Yes    How many times? 1-2   happen when taking pain meds that meade her dizzy   Has the patient had a decrease in activity level because of a fear of falling?  No    Is the patient reluctant to leave their home because of a fear of falling?  Yes      [place] residence    Living Arrangements Spouse/significant other    Available Help at Discharge Family    Type of [place] to enter    Entrance Stairs-Number of Steps 1    [place] One level    [place] None      Prior Function  Level of Independence Independent with basic ADLs    Leisure cares for her husband      Cognition   Overall Cognitive Status Within Functional Limits for tasks assessed      Posture/Postural Control   Posture/Postural Control Postural limitations    Postural Limitations Flexed trunk;Anterior pelvic tilt;Rounded Shoulders      ROM / Strength   AROM / PROM / Strength AROM;PROM;Strength      Strength   Right Hip Flexion 3/5    Right Hip External Rotation  5/5    Right Hip Internal Rotation 5/5    Right Hip ABduction 4/5    Right Hip ADduction 4/5    Left Hip Flexion 3/5    Left Hip External Rotation 4/5    Left Hip Internal Rotation 5/5    Left Hip ABduction 5/5                      Objective measurements completed on examination: See above findings.     Pelvic Floor Special Questions - 11/20/20 0001    Prior Pregnancies Yes    Number of Vaginal Deliveries 1    Currently Sexually Active No    Urinary Leakage Yes    Pad use 1 pad and diper during the night; pad and diaper 6-7  times  per day and changes both    Activities that cause leaking With strong urge;Coughing;Laughing;Sneezing;Lifting;Bending    Urinary urgency Yes    Fecal incontinence Yes   strain for BM, sometime 4 days for BM   Fluid intake water    Skin Integrity Intact    Pelvic Floor Internal Exam Patient confirms identification and approves PT to assess pelvic floor and treatment    Exam Type Vaginal    Palpation at first she would hold her breath and bear down but with VC she was able to breath and contract the pelvic floor upward; tightness in the perineal body and right side of introitus    Strength weak squeeze, no lift    Strength # of seconds 2                    PT Education - 11/20/20 1411    Education Details Access Code: [facility]    Person(s) Educated Patient    Methods Explanation;Demonstration;Verbal cues;Handout    Comprehension Verbalized understanding;Returned demonstration            PT Short Term Goals - 11/20/20 2101      PT SHORT TERM GOAL #1   Title Pt will be independent with initial HEP    Time 4    Period Weeks    Status New    Target Date 12/18/20      PT SHORT TERM GOAL #2   Title Patient will be able to correctly contract her pelvic floor without bulging due to improve coordination    Time 4    Period Weeks    Status New    Target Date 12/18/20      PT SHORT TERM GOAL #3   Title Patient understands vaginal health to reduce dryness and irritation from wearing pads often    Time 4    Period Weeks    Status New    Target Date 12/18/20             PT Long Term Goals - 11/20/20 2104      PT LONG TERM GOAL #1   Title Patient is independent with  advanced HEP for pelvic floor strength    Baseline ---    Time 12    Period Weeks    Status New    Target Date 02/12/21      PT LONG TERM GOAL #2   Title able to walk to the bathroom without leaking urine due to improve pelvic floor coordination    Baseline ---    Time 12    Period Weeks     Status New    Target Date 02/12/21      PT LONG TERM GOAL #3   Title reduce the number of her pads to </= 2 per day, just pads no diapers    Baseline ---    Time 12    Period Weeks    Status New    Target Date 02/12/21      PT LONG TERM GOAL #4   Title Patient reports her pelvic pain decreased >/= 50% due to reducation of muscle spams.    Baseline ---    Time 12    Period Weeks    Status New    Target Date 02/12/21      PT LONG TERM GOAL #5   Title Patient reports reducation of straining to have a bowel movement >/= 50% so she is able to have one every 2-3 days    Baseline ---    Time 12    Period Weeks    Status New    Target Date 02/12/21                  Plan - 11/20/20 2050    Clinical Impression Statement Patient is a 80 year old female with urinary leakage and constipation for many years. Patient reports 9/10 intermittent pain that happens suddenly and sometimes will sit on the commode to reduce the pain. Patient has weakness in her hips. Pelvic floor strength is 2/5. Initially when she contracts the pelvic floor she will bulge down until the therapist gave her verbal cues to contract correctly. Tightness in the perineal body and right side of the introitus. Patient wears 1 pad and a diaper. She will change the pad and diaper 7 times per day and 1 time at night. She will strain for a bowel movement and not have one for 4 days. Patient will leak urine with cough, sneeze, laugh, sit to stand and when she has a strong urge. Patient reports the medication that the doctor gave to her has helped. Patient will benefit from skilled therapy to improve pelvic floor coordination and strength to reduce her leakage and reduce her sporadic pain.    Personal Factors and Comorbidities Fitness;Age;Past/Current Experience    Comorbidities diabetes; abdominal hysterectomy; macular degeneration    Examination-Activity Limitations Continence;Toileting;Sit;Stand    Stability/Clinical  Decision Making Stable/Uncomplicated    Clinical Decision Making Low    Rehab Potential Good    PT Frequency 1x / week    PT Duration 12 weeks    PT Treatment/Interventions ADLs/Self Care Home Management;Biofeedback;Therapeutic activities;Therapeutic exercise;Patient/family education;Manual techniques    PT Next Visit Plan manual work to the pelvic floor to increase the perineal body and right side of the introitus    PT Home Exercise Plan Access Code: [email or web address]    Consulted and Agree with Plan of Care Patient           Patient will benefit from skilled therapeutic intervention in order to improve the following deficits and impairments:  Decreased coordination,Increased fascial restricitons,Decreased endurance,Increased muscle spasms,Decreased  activity tolerance,Decreased strength  Visit Diagnosis: Muscle weakness (generalized) - Plan: PT plan of care cert/re-cert  Other lack of coordination - Plan: PT plan of care cert/re-cert  Cramp and spasm - Plan: PT plan of care cert/re-cert  Urinary incontinence, unspecified type - Plan: PT plan of care cert/re-cert  Constipation, unspecified constipation type - Plan: PT plan of care cert/re-cert     Problem List Patient Active Problem List   Diagnosis Date Noted  . Ear fullness, bilateral 05/11/2020  . Financial difficulties 11/16/2019  . Osteoarthritis of left knee 06/13/2019  . Rotator cuff syndrome of right shoulder 01/21/2019  . Bladder prolapse, female, acquired 08/16/2018  . Urinary incontinence 07/05/2018  . Frequent falls 12/24/2017  . Yeast vaginitis 06/17/2017  . Adjustment reaction with anxiety and depression 03/26/2017  . Ruptured silicone breast implant 12/08/2016  . Iron deficiency anemia due to chronic blood loss 10/31/2015  . Chronic right-sided thoracic back pain 07/26/2015  . Spinal stenosis of lumbar region 07/10/2015  . Abdominal aortic atherosclerosis ([place]) 07/10/2015  . Sleep apnea 05/22/2010  . PERIPHERAL  NEUROPATHY 02/06/2010  . PULMONARY NODULE, SOLITARY 07/20/2009  . Irritable bowel syndrome 11/12/2007  . Non-insulin dependent type 2 diabetes mellitus ([place]) 10/08/2006  . HYPERCHOLESTEROLEMIA 10/08/2006  . HYPERTRIGLYCERIDEMIA 10/08/2006  . Migraine headache 10/08/2006  . CARPAL TUNNEL SYNDROME 10/08/2006  . HYPERTENSION, BENIGN SYSTEMIC 10/08/2006  . ASTHMA, PERSISTENT, MODERATE 10/08/2006  . REFLUX ESOPHAGITIS 10/08/2006  . Eczema 10/08/2006  . Psoriasis 10/08/2006  . Osteoarthritis of spine 10/08/2006  . VERTIGO NOS OR DIZZINESS 10/08/2006    Earlie Counts, PT 11/20/20 9:14 PM   [facility] at [facility] [address], [place], [place], [address] Phone: [phone]   Fax:  [phone]  Name: [name] MRN: [id number] Date of Birth: 09/28/1940

## 2020-11-20 NOTE — Patient Instructions (Addendum)
Access Code: [facility] URL: [email or web address] Date: 11/20/2020 Prepared by: Earlie Counts  Exercises Supine Pelvic Floor Contract and Release - 3 x daily - 7 x weekly - 1 sets - 15 reps - 5 sec hold Earlie Counts, PT [facility] [address], [place], [place] [address] W: [phone] [email or web address]

## 2020-11-24 ENCOUNTER — Other Ambulatory Visit: Payer: Self-pay | Admitting: Family Medicine

## 2020-11-24 DIAGNOSIS — E7800 Pure hypercholesterolemia, unspecified: Secondary | ICD-10-CM

## 2020-11-27 ENCOUNTER — Encounter: Payer: HMO | Admitting: Physical Therapy

## 2020-11-28 ENCOUNTER — Ambulatory Visit (INDEPENDENT_AMBULATORY_CARE_PROVIDER_SITE_OTHER): Payer: HMO | Admitting: Family Medicine

## 2020-11-28 ENCOUNTER — Other Ambulatory Visit: Payer: Self-pay

## 2020-11-28 DIAGNOSIS — B373 Candidiasis of vulva and vagina: Secondary | ICD-10-CM

## 2020-11-28 DIAGNOSIS — J4521 Mild intermittent asthma with (acute) exacerbation: Secondary | ICD-10-CM | POA: Diagnosis not present

## 2020-11-28 DIAGNOSIS — J45901 Unspecified asthma with (acute) exacerbation: Secondary | ICD-10-CM | POA: Insufficient documentation

## 2020-11-28 DIAGNOSIS — B3731 Acute candidiasis of vulva and vagina: Secondary | ICD-10-CM

## 2020-11-28 MED ORDER — PREDNISONE 20 MG PO TABS: 40.0000 mg | ORAL_TABLET | Freq: Every day | ORAL | 0 refills | Status: AC

## 2020-11-28 MED ORDER — ALBUTEROL SULFATE HFA 108 (90 BASE) MCG/ACT IN AERS: 2.0000 | INHALATION_SPRAY | RESPIRATORY_TRACT | 0 refills | Status: DC | PRN

## 2020-11-28 MED ORDER — BENZONATATE 200 MG PO CAPS: 200.0000 mg | ORAL_CAPSULE | Freq: Two times a day (BID) | ORAL | 0 refills | Status: DC | PRN

## 2020-11-28 MED ORDER — FLUCONAZOLE 150 MG PO TABS: 150.0000 mg | ORAL_TABLET | Freq: Once | ORAL | 0 refills | Status: AC

## 2020-11-28 NOTE — Patient Instructions (Addendum)
Wheezing/Cough  - take albuterol every 4 hours and as needed for coughing and wheezing  - take prednisone 40mg  for five days   Prednisone tablets What is this medicine? PREDNISONE (PRED ni sone) is a corticosteroid. It is commonly used to treat inflammation of the skin, joints, lungs, and other organs. Common conditions treated include asthma, allergies, and arthritis. It is also used for other conditions, such as blood disorders and diseases of the adrenal glands. This medicine may be used for other purposes; ask your health care provider or pharmacist if you have questions. COMMON BRAND NAME(S): Deltasone, Predone, Sterapred, Sterapred DS What should I tell my health care provider before I take this medicine? They need to know if you have any of these conditions:  Cushing's syndrome  diabetes  glaucoma  heart disease  high blood pressure  infection (especially a virus infection such as chickenpox, cold sores, or herpes)  kidney disease  liver disease  mental illness  myasthenia gravis  osteoporosis  seizures  stomach or intestine problems  thyroid disease  an unusual or allergic reaction to lactose, prednisone, other medicines, foods, dyes, or preservatives  pregnant or trying to get pregnant  breast-feeding How should I use this medicine? Take this medicine by mouth with a glass of water. Follow the directions on the prescription label. Take this medicine with food. If you are taking this medicine once a day, take it in the morning. Do not take more medicine than you are told to take. Do not suddenly stop taking your medicine because you may develop a severe reaction. Your doctor will tell you how much medicine to take. If your doctor wants you to stop the medicine, the dose may be slowly lowered over time to avoid any side effects. Talk to your pediatrician regarding the use of this medicine in children. Special care may be needed. Overdosage: If you think you have  taken too much of this medicine contact a poison control center or emergency room at once. NOTE: This medicine is only for you. Do not share this medicine with others. What if I miss a dose? If you miss a dose, take it as soon as you can. If it is almost time for your next dose, talk to your doctor or health care professional. You may need to miss a dose or take an extra dose. Do not take double or extra doses without advice. What may interact with this medicine? Do not take this medicine with any of the following medications:  metyrapone  mifepristone This medicine may also interact with the following medications:  aminoglutethimide  amphotericin B  aspirin and aspirin-like medicines  barbiturates  certain medicines for diabetes, like glipizide or glyburide  cholestyramine  cholinesterase inhibitors  cyclosporine  digoxin  diuretics  ephedrine  female hormones, like estrogens and birth control pills  isoniazid  ketoconazole  NSAIDS, medicines for pain and inflammation, like ibuprofen or naproxen  phenytoin  rifampin  toxoids  vaccines  warfarin This list may not describe all possible interactions. Give your health care provider a list of all the medicines, herbs, non-prescription drugs, or dietary supplements you use. Also tell them if you smoke, drink alcohol, or use illegal drugs. Some items may interact with your medicine. What should I watch for while using this medicine? Visit your doctor or health care professional for regular checks on your progress. If you are taking this medicine over a prolonged period, carry an identification card with your name and address, the type  and dose of your medicine, and your doctor's name and address. This medicine may increase your risk of getting an infection. Tell your doctor or health care professional if you are around anyone with measles or chickenpox, or if you develop sores or blisters that do not heal properly. If  you are going to have surgery, tell your doctor or health care professional that you have taken this medicine within the last twelve months. Ask your doctor or health care professional about your diet. You may need to lower the amount of salt you eat. This medicine may increase blood sugar. Ask your healthcare provider if changes in diet or medicines are needed if you have diabetes. What side effects may I notice from receiving this medicine? Side effects that you should report to your doctor or health care professional as soon as possible:  allergic reactions like skin rash, itching or hives, swelling of the face, lips, or tongue  changes in emotions or moods  changes in vision  depressed mood  eye pain  fever or chills, cough, sore throat, pain or difficulty passing urine  signs and symptoms of high blood sugar such as being more thirsty or hungry or having to urinate more than normal. You may also feel very tired or have blurry vision.  swelling of ankles, feet Side effects that usually do not require medical attention (report to your doctor or health care professional if they continue or are bothersome):  confusion, excitement, restlessness  headache  nausea, vomiting  skin problems, acne, thin and shiny skin  trouble sleeping  weight gain This list may not describe all possible side effects. Call your doctor for medical advice about side effects. You may report side effects to FDA at 1-800-FDA-1088. Where should I keep my medicine? Keep out of the reach of children. Store at room temperature between 15 and 30 degrees C (59 and 86 degrees F). Protect from light. Keep container tightly closed. Throw away any unused medicine after the expiration date. NOTE: This sheet is a summary. It may not cover all possible information. If you have questions about this medicine, talk to your doctor, pharmacist, or health care provider.  2021 Elsevier/Gold Standard (2018-04-27  10:54:22)

## 2020-11-28 NOTE — Assessment & Plan Note (Signed)
Symptoms consistent with yeast infection.  Sending Diflucan to pharmacy.

## 2020-11-28 NOTE — Progress Notes (Deleted)
    SUBJECTIVE:   CHIEF COMPLAINT / HPI:   Hx of asthma  No fevers/chills, SOB  Chest rattling, wheezing   Taking tessalon perles -- helping cough.  Never hospitalized for asthma.  Never other medications than albuterol. Has not taken albuterol for many years.   PERTINENT  PMH / PSH: ***  OBJECTIVE:   BP 109/67   Pulse (!) 110   Ht 4' 9.5" (1.461 m)   SpO2 93%   BMI 38.06 kg/m   ***  ASSESSMENT/PLAN:   No problem-specific Assessment & Plan notes found for this encounter.    Wilber Oliphant, MD [place]   {    This will disappear when note is signed, click to select method of visit    :1}

## 2020-11-28 NOTE — Progress Notes (Signed)
    SUBJECTIVE:   CHIEF COMPLAINT / HPI:   Cough Patient presenting with cough and chest congestion x2 to 3 weeks.  She reports that this feels like bronchitis.  Patient denies any initial viral symptoms such as congestion or nasal drainage.  She does report mild shortness of breath with more severe episodes of coughing and heavy exertion.  She does report that she has been feeling more tired recently but also notes that her husband has been hospitalized recently.  She does have a history of well-controlled asthma and has not been on any asthma medication for several years.  She has never been hospitalized or treated for asthma exacerbation.  She denies any fevers, chills, nausea, vomiting, hemoptysis.  Patient reports that cough has been persistent without much improvement.  She does report that she has been using [employer] which have provided relief.  Yeast infection Patient reports having symptoms consistent with yeast infection.  She reports vaginal itching, irritation, cottage cheeselike vaginal discharge.  She reports trying over-the-counter medications without relief.  PERTINENT  PMH / PSH: Hypertension, hyperlipidemia, eczema, migraines  OBJECTIVE:   BP 109/67   Pulse (!) 110   Ht 4' 9.5" (1.461 m)   SpO2 93%   BMI 38.06 kg/m   General: Well-appearing female, no acute distress Lungs: Expiratory wheezing appreciated in all lung fields.  Speaking in full sentences.  No respiratory distress. Chest: Regular rate and rhythm.  ASSESSMENT/PLAN:   Yeast vaginitis Symptoms consistent with yeast infection.  Sending Diflucan to pharmacy.  Asthma exacerbation Patient presenting with persistent cough x2 to 3 weeks.  She is satting 93% on room air and has expiratory wheezing in all lung fields today.  She does have a history of moderate persistent asthma on chart review.  She reports that she has used albuterol and maintenance inhaler but not for many years.  She does have a history  of smoking and a lung nodule that was seen in 2010 and stable on future imaging.  Will treat patient has asthma exacerbation with scheduled albuterol, prednisone burst x5 days.  Patient also requesting refill of Tessalon Perles.  If she does not have any improvement in her cough, can consider repeat imaging. Patient to follow-up with Dr. Andria Frames in 1 week     Wilber Oliphant, MD [place]

## 2020-11-28 NOTE — Assessment & Plan Note (Addendum)
Patient presenting with persistent cough x2 to 3 weeks.  She is satting 93% on room air and has expiratory wheezing in all lung fields today.  She does have a history of moderate persistent asthma on chart review.  She reports that she has used albuterol and maintenance inhaler but not for many years.  She does have a history of smoking and a lung nodule that was seen in 2010 and stable on future imaging.  Will treat patient has asthma exacerbation with scheduled albuterol, prednisone burst x5 days.  Patient also requesting refill of Tessalon Perles.  If she does not have any improvement in her cough, can consider repeat imaging. Patient to follow-up with Dr. Andria Frames in 1 week

## 2020-12-04 ENCOUNTER — Encounter: Payer: HMO | Admitting: Physical Therapy

## 2020-12-10 ENCOUNTER — Ambulatory Visit (INDEPENDENT_AMBULATORY_CARE_PROVIDER_SITE_OTHER): Payer: HMO | Admitting: Family Medicine

## 2020-12-10 ENCOUNTER — Encounter: Payer: Self-pay | Admitting: Family Medicine

## 2020-12-10 ENCOUNTER — Telehealth: Payer: Self-pay | Admitting: *Deleted

## 2020-12-10 ENCOUNTER — Other Ambulatory Visit: Payer: Self-pay

## 2020-12-10 VITALS — BP 114/74 | HR 102 | Ht <= 58 in | Wt 180.6 lb

## 2020-12-10 DIAGNOSIS — E119 Type 2 diabetes mellitus without complications: Secondary | ICD-10-CM

## 2020-12-10 DIAGNOSIS — R32 Unspecified urinary incontinence: Secondary | ICD-10-CM | POA: Diagnosis not present

## 2020-12-10 DIAGNOSIS — F4323 Adjustment disorder with mixed anxiety and depressed mood: Secondary | ICD-10-CM

## 2020-12-10 DIAGNOSIS — M1712 Unilateral primary osteoarthritis, left knee: Secondary | ICD-10-CM | POA: Diagnosis not present

## 2020-12-10 DIAGNOSIS — B370 Candidal stomatitis: Secondary | ICD-10-CM | POA: Insufficient documentation

## 2020-12-10 LAB — POCT GLYCOSYLATED HEMOGLOBIN (HGB A1C): HbA1c, POC (controlled diabetic range): 7.3 % — AB (ref 0.0–7.0)

## 2020-12-10 MED ORDER — NYSTATIN 100000 UNIT/ML MT SUSP: 5.0000 mL | Freq: Four times a day (QID) | OROMUCOSAL | 0 refills | Status: DC

## 2020-12-10 MED ORDER — BENZONATATE 200 MG PO CAPS: 200.0000 mg | ORAL_CAPSULE | Freq: Two times a day (BID) | ORAL | 3 refills | Status: DC | PRN

## 2020-12-10 NOTE — Telephone Encounter (Signed)
Pt in office today, picked up ozempic that was in a bag in med fridge with her name on it.Serenna Deroy Zimmerman Rumple, CMA

## 2020-12-11 ENCOUNTER — Encounter: Payer: HMO | Admitting: Physical Medicine & Rehabilitation

## 2020-12-11 ENCOUNTER — Other Ambulatory Visit: Payer: Self-pay | Admitting: Family Medicine

## 2020-12-11 ENCOUNTER — Telehealth: Payer: Self-pay

## 2020-12-11 ENCOUNTER — Encounter: Payer: Self-pay | Admitting: Physical Medicine & Rehabilitation

## 2020-12-11 DIAGNOSIS — R059 Cough, unspecified: Secondary | ICD-10-CM

## 2020-12-11 NOTE — Progress Notes (Deleted)
  PROCEDURE RECORD [facility]   Name: [name] DOB:September 22, 1940 MRN: [id number]  Date:12/11/2020  Physician: {CPRM-PROVIDERS:[id number]}    Nurse/CMA: ***  Allergies:  Allergies  Allergen Reactions  . Amoxicillin Rash  . Azithromycin Rash    Itching/rash  . Butalbital Other (See Comments)    Reaction: trouble breathing, but uncertain  . Clindamycin Hcl Rash  . Januvia [Sitagliptin] Other (See Comments)    Worsened migraines.  [place] [Levofloxacin In D5w] Diarrhea  . Meperidine Hcl Other (See Comments)    REACTION: itching and hallucinations  . Sulfamethoxazole Rash  . Buspirone     Made urinary incontinence worse.    Marland Kitchen Ketorolac Swelling    Received ketorolac, methocarbamal, and lidocaine patch all at the same time.  Lip swelling 12 hours later  . Lidocaine Swelling    Received ketorolac, methocarbamal, and lidocaine patch all at the same time.  Lip swelling 12 hours later  . Macrodantin [Nitrofurantoin Macrocrystal] Itching  . Methocarbamol Swelling    Received ketorolac, methocarbamal, and lidocaine patch all at the same time.  Lip swelling 12 hours later  . Codeine Phosphate Other (See Comments)    REACTION: unknown  . Etodolac Other (See Comments)    REACTION: stomach upset  . Gabapentin Other (See Comments)    REACTION: mental clouding  Does not want to try again even in low dose.  . Naproxen Other (See Comments)    REACTION: stomach upset  . Pentazocine-Naloxone Hcl Other (See Comments)    REACTION: unspecified  . Sumatriptan Other (See Comments)    REACTION: chest pain  . Zonegran Other (See Comments)    Reaction: Unknown    Consent Signed: {yes no:314532}  Is patient diabetic? {yes no:314532}  CBG today? ***  Pregnant: {yes no:314532} LMP: No LMP recorded. Patient has had a hysterectomy. (age 24-55)  Anticoagulants: {Yes/No:19989} Anti-inflammatory: {Yes/No:19989} Antibiotics: {Yes/No:19989}  Procedure:  ***  Position: {PRONE/SUPINE/SITTING/LATERAL:21022916} Start Time: ***  End Time: ***  Fluoro Time: ***  RN/CMA *** ***    Time *** ***    BP *** ***    Pulse *** ***    Respirations *** ***    O2 Sat *** ***    S/S *** ***    Pain Level *** ***     D/C home with ***, patient A & O X 3, D/C instructions reviewed, and sits independently.

## 2020-12-11 NOTE — Progress Notes (Unsigned)
Patient had to reschedule procedure appointment  temperature 99.6 &  100.6.

## 2020-12-11 NOTE — Telephone Encounter (Signed)
Patient calls nurse line reporting she had to reschedule her back injection today due to arriving with 100.6 temperature. Patient reports fatigue, congestion, and cough. Patient states she was seen a few weeks ago and was given a steroid and inhaler, however she feels she is not improving. Patient states Dr. Read Drivers will not move forward with injection until she is "fever" free. Patient states she does have an at home covid test. Patient would like to speak with PCP before she uses it. Please advise.

## 2020-12-11 NOTE — Telephone Encounter (Signed)
Fever noted causing procedure to be canceled.  Reviewed:  We did not take temp yesterday.  Only symptom is lingering cough from bronchitis three weeks ago.  Will check CXR.

## 2020-12-11 NOTE — Progress Notes (Signed)
    SUBJECTIVE:   CHIEF COMPLAINT / HPI:   Left knee pain and more: 1, Chronic left knee pain.  Worse due to increased caregiver responsibilities (plus her own aging.)  Wants steroid injection.  Great response to last shot which lasted for years. 2. Caregiver stress.  Husband is dwindling with his lymphoma and myelofibrosis.  He is requiring more care - care that she is physically unable to provide.   3. Mouth soreness.  Has responded to thrush treatment in the past.   OBJECTIVE:   BP 114/74   Pulse (!) 102   Ht 4\' 10"  (1.473 m)   Wt 180 lb 9.6 oz (81.9 kg)   SpO2 94%   BMI 37.75 kg/m   Left knee, crepitus.  No instability.  No effusion.  Pain at joint line. Mood is anxious.  She voices feeling overwhelmed. Mouth red, no obvious white patches.  Informed consent obtained.  Timeout taken.  Injected left knee with depomedrol 80 mg and 3 cc xylocaine.  Patient tolerated the procedure well and left clinic in good condition.    ASSESSMENT/PLAN:   Adjustment reaction with anxiety and depression Worse with increasing burdon of caregiver stress.  I committed to ordering care management for husband to see what support we can mobilize.  She will talk with husband about goals of care.  Even though he has had a good, long run, it may be time to consider switching to a more comfort care approach.  Osteoarthritis of left knee Injection.  Good result to previous steroid injection.  Thrush Treat presumptively even though exam is not clear cut.     Zenia Resides, MD [place]

## 2020-12-12 ENCOUNTER — Ambulatory Visit: Payer: HMO | Admitting: Obstetrics and Gynecology

## 2020-12-12 ENCOUNTER — Encounter: Payer: Self-pay | Admitting: Family Medicine

## 2020-12-12 NOTE — Assessment & Plan Note (Signed)
Worse with increasing burdon of caregiver stress.  I committed to ordering care management for husband to see what support we can mobilize.  She will talk with husband about goals of care.  Even though he has had a good, long run, it may be time to consider switching to a more comfort care approach.

## 2020-12-12 NOTE — Assessment & Plan Note (Signed)
Injection.  Good result to previous steroid injection.

## 2020-12-12 NOTE — Assessment & Plan Note (Signed)
>>  ASSESSMENT AND PLAN FOR ADJUSTMENT REACTION WITH ANXIETY AND DEPRESSION WRITTEN ON 12/12/2020 10:26 AM BY HENSEL, ELSIE LABOR, MD  Worse with increasing burdon of caregiver stress.  I committed to ordering care management for husband to see what support we can mobilize.  She will talk with husband about goals of care.  Even though he has had a good, long run, it may be time to consider switching to a more comfort care approach.

## 2020-12-12 NOTE — Assessment & Plan Note (Signed)
Treat presumptively even though exam is not clear cut.

## 2020-12-21 ENCOUNTER — Telehealth: Payer: Self-pay | Admitting: *Deleted

## 2020-12-21 ENCOUNTER — Other Ambulatory Visit: Payer: Self-pay | Admitting: Physical Medicine and Rehabilitation

## 2020-12-21 MED ORDER — BACLOFEN 10 MG PO TABS: 10.0000 mg | ORAL_TABLET | Freq: Every evening | ORAL | 0 refills | Status: DC | PRN

## 2020-12-21 NOTE — Telephone Encounter (Signed)
Please let her know I have sent her baclofen and to start it at night, if it does not make her sleepy she can take during day as well up to three times per day, thank you!

## 2020-12-21 NOTE — Telephone Encounter (Signed)
Notified, and resent med to the [facility] in [place].

## 2020-12-21 NOTE — Telephone Encounter (Signed)
[name] called and reports that she is having a lot of thoracic pain.  She was not able to get her injection by [name] because of having a fever.  She is asking if [name] could call her in something for her pain.  She is going to be staying with her grandson, since she lost her husband recently, and will need anything ordered sent to the [facility] in [place].  Please advise.  Her next appt is 12/26/20 with [name] and has an injection scheduled with [name] 01/10/21.

## 2020-12-24 ENCOUNTER — Other Ambulatory Visit: Payer: Self-pay | Admitting: Orthopaedic Surgery

## 2020-12-24 ENCOUNTER — Telehealth: Payer: Self-pay

## 2020-12-24 ENCOUNTER — Other Ambulatory Visit: Payer: Self-pay | Admitting: Physical Medicine and Rehabilitation

## 2020-12-24 NOTE — Telephone Encounter (Signed)
Patient called and states she is in severe pain and was given muscle relaxant however is not helping her at all . Wants to know if theres anything we can give her , her next appt is on Wed May 18th.

## 2020-12-25 ENCOUNTER — Other Ambulatory Visit: Payer: Self-pay | Admitting: Physical Medicine and Rehabilitation

## 2020-12-25 MED ORDER — MELOXICAM 7.5 MG PO TABS: 7.5000 mg | ORAL_TABLET | Freq: Every day | ORAL | 2 refills | Status: DC

## 2020-12-25 NOTE — Telephone Encounter (Signed)
[name] would like to try the meloxicam medication . She would preffered to be sent to the [facility] pharmacy she has on file [facility] on [address]

## 2020-12-25 NOTE — Telephone Encounter (Signed)
Refill

## 2020-12-26 ENCOUNTER — Other Ambulatory Visit: Payer: Self-pay | Admitting: Physical Medicine and Rehabilitation

## 2020-12-26 ENCOUNTER — Encounter: Payer: HMO | Attending: Physical Medicine and Rehabilitation | Admitting: Physical Medicine and Rehabilitation

## 2020-12-26 ENCOUNTER — Encounter: Payer: Self-pay | Admitting: Physical Medicine and Rehabilitation

## 2020-12-26 ENCOUNTER — Telehealth: Payer: Self-pay

## 2020-12-26 ENCOUNTER — Ambulatory Visit (HOSPITAL_COMMUNITY)
Admission: RE | Admit: 2020-12-26 | Discharge: 2020-12-26 | Disposition: A | Discharge status: Home / Self Care | Payer: HMO | Source: Ambulatory Visit | Attending: Family Medicine | Admitting: Family Medicine

## 2020-12-26 ENCOUNTER — Other Ambulatory Visit: Payer: Self-pay

## 2020-12-26 VITALS — BP 118/76 | HR 102 | Temp 99.2°F | Ht <= 58 in | Wt 177.0 lb

## 2020-12-26 DIAGNOSIS — M47816 Spondylosis without myelopathy or radiculopathy, lumbar region: Secondary | ICD-10-CM | POA: Diagnosis not present

## 2020-12-26 DIAGNOSIS — M546 Pain in thoracic spine: Secondary | ICD-10-CM | POA: Diagnosis not present

## 2020-12-26 DIAGNOSIS — M7918 Myalgia, other site: Secondary | ICD-10-CM

## 2020-12-26 DIAGNOSIS — K5903 Drug induced constipation: Secondary | ICD-10-CM

## 2020-12-26 DIAGNOSIS — R059 Cough, unspecified: Secondary | ICD-10-CM | POA: Diagnosis not present

## 2020-12-26 DIAGNOSIS — R918 Other nonspecific abnormal finding of lung field: Secondary | ICD-10-CM | POA: Diagnosis not present

## 2020-12-26 MED ORDER — LIDOCAINE 5 % EX PTCH: 1.0000 | MEDICATED_PATCH | CUTANEOUS | 0 refills | Status: DC

## 2020-12-26 MED ORDER — MAGNESIUM CITRATE PO SOLN: 1.0000 | Freq: Once | ORAL | 1 refills | Status: AC

## 2020-12-26 NOTE — Telephone Encounter (Signed)
Mr. Shiveley (Son) & Yailen Zemaitis has- requested a call back. She was seen today in the office and needs guidance on taking her medications. According to the Son you said she was does not need to taking some of the medications. Which ones does she need to stop?  Call back phone [phone] or [phone].

## 2020-12-26 NOTE — Progress Notes (Signed)
Subjective:    Patient ID: [name], female    DOB: 1941-02-02, 80 y.o.   MRN: [id number]  HPI  1) thoracic myofascial pain [name] is a 80 year old woman who presents to establish care for right sided thoracic muscle pain and bilateral lumbar facet arthritis.  -heat does not help -sometimes if will move higher or lower -she has been using three goats cream -she has swelling in response to methocarbamol, but chart indicates that this was due to receiving multiple new medications at the same time, did not find benefit from this or from baclofen or flexeril. Her son feels she took too many of these yesterday and was confused as a result.  -she is in excruciating pain today.  -applied some lidocaine cream in office -she says the pain comes and goes, last time I saw her was a good day.  -her last thoracic spine MRI was in 2018 and showed spondylosis -she did have a fall yesteday  2) Bilateral lumbar facet arthrosis: -she has low back pain for a long time -is worse with standing and walking  -she is unable to do therapy as she is currently doing therapy for incontinence.  -was unable to get injection with Dr. Letta Pate due to elevated temperature and has been rescheduled -she denies loss of bowel or bladder function -she denies radiation of pain into her legs   Pain Inventory Average Pain 0 Pain Right Now 10 My pain is no pain  In the last 24 hours, has pain interfered with the following? General activity 10 Relation with others 10 Enjoyment of life 10 What TIME of day is your pain at its worst? varies Sleep (in general) Fair  Pain is worse with: walking Pain improves with: heat/ice and medication Relief from Meds: 5     Family History  Problem Relation Age of Onset  . Hypertension Mother   . Hypertension Father   . Hearing loss Sister   . Heart attack Sister 62       s/p 4 bypasses   . Stroke Brother        37  . Breast cancer Maternal Aunt   . Ovarian  cancer Maternal Aunt    Social History   Socioeconomic History  . Marital status: Married    Spouse name: Not on file  . Number of children: Not on file  . Years of education: Not on file  . Highest education level: Not on file  Occupational History  . Not on file  Tobacco Use  . Smoking status: Former Smoker    Packs/day: 2.00    Years: 33.00    Pack years: 66.00    Types: Cigarettes    Start date: 08/11/1957    Quit date: 04/04/1991    Years since quitting: 29.7  . Smokeless tobacco: Never Used  Vaping Use  . Vaping Use: Never used  Substance and Sexual Activity  . Alcohol use: No  . Drug use: No  . Sexual activity: Not Currently  Other Topics Concern  . Not on file  Social History Narrative  . Not on file   Social Determinants of Health   Financial Resource Strain: Not on file  Food Insecurity: Not on file  Transportation Needs: Not on file  Physical Activity: Not on file  Stress: Not on file  Social Connections: Not on file   Past Surgical History:  Procedure Laterality Date  . ABDOMINAL HYSTERECTOMY     ?Lt. ovary remains  .  APPENDECTOMY    . Arm surgery    . BREAST BIOPSY Right 10/10/2009  . BREAST CYST ASPIRATION  12/20/2013  . BREAST EXCISIONAL BIOPSY Left 1998  . BREAST EXCISIONAL BIOPSY Right 1993   Past Medical History:  Diagnosis Date  . Asthma   . Depression   . Diabetes mellitus without complication ([place])   . History of surgery on arm    right arm ( plate and screws)  . Hypertension   . Macular degeneration   . Migraine    without aura  . Urinary incontinence    BP 118/76   Pulse (!) 102   Temp 99.2 F (37.3 C)   Ht 4\' 10"  (1.473 m)   Wt 177 lb (80.3 kg)   SpO2 91%   BMI 36.99 kg/m   Opioid Risk Score:   Fall Risk Score:  `1  Depression screen PHQ 2/9  Depression screen [facility] 2/9 12/11/2020 12/10/2020 11/15/2020 07/19/2020 06/04/2020 05/10/2020 01/16/2020  Decreased Interest 0 1 3 3  0 0 0  Down, Depressed, Hopeless 0 0 0 1 0 0 0  PHQ  - 2 Score 0 1 3 4  0 0 0  Altered sleeping - 0 0 0 0 1 -  Tired, decreased energy - 2 0 3 1 1  -  Change in appetite - 0 0 3 0 1 -  Feeling bad or failure about yourself  - 0 0 2 0 0 -  Trouble concentrating - 0 0 2 0 0 -  Moving slowly or fidgety/restless - 0 0 3 0 0 -  Suicidal thoughts - 0 0 - 0 0 -  PHQ-9 Score - 3 3 17 1 3  -  Difficult doing work/chores - - Not difficult at all - Somewhat difficult - -  Some recent data might be hidden    Review of Systems  Musculoskeletal: Positive for back pain and gait problem.       Spasms  All other systems reviewed and are negative.      Objective:   Physical Exam Gen: no distress, normal appearing HEENT: oral mucosa pink and moist, NCAT Cardio: Reg rate Chest: normal effort, normal rate of breathing Abd: soft, non-distended Ext: no edema Psych: pleasant, normal affect Skin: intact Neuro: Alert and oriented x3 Musculoskeletal: Pain reproduced with lumbar extension and standing, tenderness to palpation over bilateral lower lumbar paraspinals as well as right sided thoracic paraspinal. Negative slump test bilaterally. Antalgic gait, required wheelchair transport due to severity of thoracic paraspinal.   Assessment & Plan:  [name] is a 80 year old woman who presents to establish care for the following conditions:  1) right sided thoracic muscle spasm: -d/c robaxin since not helping. -recommend applying blue emu oil -thoracic spine XR ordered to assess for compression fracture.  -increase mobic to 7.5mg  BID, discussed side effects -prescribed lidocaine patch -MRI thoracic spine reviewed with patient and son (from 2021): shows spondylosis -take tylenol 650mg  TID -continue balcofen 10mg  TID but take only at night if sedated  2) bilateral lumbar facet arthrosis: --MBB b/l is indicated for the following levels: b/l L4-L5 and L5-S1. The patient has severe low back pain that affects QOL. The patient has failed 6 months of  conservative care with physical therapy (for a different issue, she cannot due for back since only permitted to target one body area at a time), medications, activity and lifestyle modification. MRI was performed and shows severe facet arthropathy at these levels. The patient has never had MBB before. -  f/u with Dr. Danae Chen for MBB at next available appointment  3) constipation: -advised magnesium citrate

## 2020-12-26 NOTE — Patient Instructions (Signed)
Baclofen three times per day- if oversedated then just take at night Lidocaine patch 12 hours during the day Tylenol 650mg  morning lunch and dinner Meloxicam 7.5 twice per day.  XR thoracic spine now Stop Flexeril. Magnesium citrate

## 2020-12-28 ENCOUNTER — Other Ambulatory Visit (HOSPITAL_BASED_OUTPATIENT_CLINIC_OR_DEPARTMENT_OTHER): Payer: Self-pay | Admitting: Obstetrics & Gynecology

## 2020-12-28 ENCOUNTER — Telehealth: Payer: Self-pay | Admitting: Obstetrics and Gynecology

## 2020-12-28 MED ORDER — GEMTESA 75 MG PO TABS: 1.0000 | ORAL_TABLET | Freq: Every day | ORAL | 2 refills | Status: DC

## 2020-12-28 NOTE — Telephone Encounter (Signed)
DOB verified. Pt called to say that she only has one Myrbetriq tablet left and her pharmacy advised her that insurance was raising the price of this medication. She is asking to see if there is an alternative medication that she can take. Advised that I would send her request to a provider and let her know the recommendation. Pt verbalized understanding.

## 2020-12-28 NOTE — Telephone Encounter (Signed)
Rx sent to pharmacy for [place] daily.  In Epic when placing this order, it says Mybetriq is the preferred medication with $15 cost for 30 days.  It might be worth calling her pharmacy to double check what she said.  Thanks.

## 2020-12-31 ENCOUNTER — Encounter (HOSPITAL_BASED_OUTPATIENT_CLINIC_OR_DEPARTMENT_OTHER): Payer: HMO | Admitting: Physical Medicine and Rehabilitation

## 2020-12-31 ENCOUNTER — Encounter: Payer: Self-pay | Admitting: Family Medicine

## 2020-12-31 ENCOUNTER — Encounter: Payer: Self-pay | Admitting: Physical Medicine and Rehabilitation

## 2020-12-31 ENCOUNTER — Ambulatory Visit (INDEPENDENT_AMBULATORY_CARE_PROVIDER_SITE_OTHER): Payer: HMO | Admitting: Family Medicine

## 2020-12-31 ENCOUNTER — Other Ambulatory Visit: Payer: Self-pay

## 2020-12-31 VITALS — BP 162/86 | HR 108 | Ht <= 58 in | Wt 174.2 lb

## 2020-12-31 DIAGNOSIS — E119 Type 2 diabetes mellitus without complications: Secondary | ICD-10-CM | POA: Diagnosis not present

## 2020-12-31 DIAGNOSIS — I10 Essential (primary) hypertension: Secondary | ICD-10-CM

## 2020-12-31 DIAGNOSIS — F4323 Adjustment disorder with mixed anxiety and depressed mood: Secondary | ICD-10-CM

## 2020-12-31 DIAGNOSIS — Z79899 Other long term (current) drug therapy: Secondary | ICD-10-CM | POA: Insufficient documentation

## 2020-12-31 DIAGNOSIS — R34 Anuria and oliguria: Secondary | ICD-10-CM | POA: Diagnosis not present

## 2020-12-31 DIAGNOSIS — M47816 Spondylosis without myelopathy or radiculopathy, lumbar region: Secondary | ICD-10-CM | POA: Diagnosis not present

## 2020-12-31 DIAGNOSIS — G8929 Other chronic pain: Secondary | ICD-10-CM

## 2020-12-31 DIAGNOSIS — M546 Pain in thoracic spine: Secondary | ICD-10-CM | POA: Diagnosis not present

## 2020-12-31 LAB — POCT URINALYSIS DIP (MANUAL ENTRY)
Bilirubin, UA: NEGATIVE
Blood, UA: NEGATIVE
Glucose, UA: NEGATIVE mg/dL
Ketones, POC UA: NEGATIVE mg/dL
Leukocytes, UA: NEGATIVE
Nitrite, UA: POSITIVE — AB
Protein Ur, POC: NEGATIVE mg/dL
Spec Grav, UA: 1.025 (ref 1.010–1.025)
Urobilinogen, UA: 1 E.U./dL
pH, UA: 6.5 (ref 5.0–8.0)

## 2020-12-31 MED ORDER — LIDOCAINE 4 % EX CREA: 1.0000 "application " | TOPICAL_CREAM | CUTANEOUS | 3 refills | Status: DC | PRN

## 2020-12-31 NOTE — Telephone Encounter (Signed)
Called pt to let her know that Rx for Gemtesa had been sent to her pharmacy. Advised that she could pick up at her convenience and to let us know if she had any issues. Pt verbalized understanding.

## 2020-12-31 NOTE — Patient Instructions (Addendum)
Serious med review. See me again in 2-4 weeks and we'll keep working on it.

## 2020-12-31 NOTE — Assessment & Plan Note (Signed)
>>  ASSESSMENT AND PLAN FOR ADJUSTMENT REACTION WITH ANXIETY AND DEPRESSION WRITTEN ON 12/31/2020  3:33 PM BY HENSEL, ELSIE LABOR, MD  Not currently on cymbalta .  I would consider restarting, perhaps at the 30 mg dose for both depression and neuropathic pain

## 2020-12-31 NOTE — Assessment & Plan Note (Addendum)
Chronic pain is important and we do not want the cure to be worse than the disease.  Explained PT is always a safe therapy choice.

## 2020-12-31 NOTE — Assessment & Plan Note (Addendum)
Together, we cleaned up med list as best we could.  Still has three urge incontinence meds on list.  Also has two different ACE doses listed.  They will return in 2 weeks or so to further clean up list.

## 2020-12-31 NOTE — Assessment & Plan Note (Signed)
Not currently on cymbalta.  I would consider restarting, perhaps at the 30 mg dose for both depression and neuropathic pain

## 2020-12-31 NOTE — Assessment & Plan Note (Signed)
Great weight loss and home BS.  No change in treatment.

## 2020-12-31 NOTE — Progress Notes (Signed)
Subjective:    Patient ID: [name], female    DOB: Jan 17, 1941, 80 y.o.   MRN: [id number]  HPI  Due to national recommendations of social distancing because of COVID 52, an audio/video tele-health visit is felt to be the most appropriate encounter for this patient at this time. See MyChart message from today for the patient's consent to a tele-health encounter with [place]. This is a follow up tele-visit via phone. The patient is at phone. MD is at office.   1) thoracic myofascial pain [name] is a 80 year old woman who presents to establish care for right sided thoracic muscle pain and bilateral lumbar facet arthritis.  -heat does not help -sometimes if will move higher or lower -she has been using three goats cream -she has swelling in response to methocarbamol, but chart indicates that this was due to receiving multiple new medications at the same time, did not find benefit from this or from baclofen or flexeril. Her son feels she took too many of these yesterday and was confused as a result.  -applied some lidocaine cream in office last visit and this helped.  -she says the pain comes and goes, last time I saw her was a good day.  -her last thoracic spine MRI was in 2018 and showed spondylosis -she did have a fall  -called today to discuss results of thoracic spine XR -pain has been pretty bad.  -she had an appointment with Dr. Andria Frames today and some of her medications were changed. She is to f/u with him again in 2 weeks.  -she stayed by herself last night and thinks she is on the mend.   2) Bilateral lumbar facet arthrosis: -she has low back pain for a long time -is worse with standing and walking  -she is unable to do therapy as she is currently doing therapy for incontinence.  -was unable to get injection with Dr. Letta Pate due to elevated temperature and has been rescheduled -she denies loss of bowel or bladder function -she denies  radiation of pain into her legs   Pain Inventory Average Pain 0 Pain Right Now 10 My pain is no pain  In the last 24 hours, has pain interfered with the following? General activity 10 Relation with others 10 Enjoyment of life 10 What TIME of day is your pain at its worst? varies Sleep (in general) Fair  Pain is worse with: walking Pain improves with: heat/ice and medication Relief from Meds: 5     Family History  Problem Relation Age of Onset  . Hypertension Mother   . Hypertension Father   . Hearing loss Sister   . Heart attack Sister 57       s/p 4 bypasses   . Stroke Brother        77  . Breast cancer Maternal Aunt   . Ovarian cancer Maternal Aunt    Social History   Socioeconomic History  . Marital status: Married    Spouse name: Not on file  . Number of children: Not on file  . Years of education: Not on file  . Highest education level: Not on file  Occupational History  . Not on file  Tobacco Use  . Smoking status: Former Smoker    Packs/day: 2.00    Years: 33.00    Pack years: 66.00    Types: Cigarettes    Start date: 08/11/1957    Quit date: 04/04/1991    Years  since quitting: 29.7  . Smokeless tobacco: Never Used  Vaping Use  . Vaping Use: Never used  Substance and Sexual Activity  . Alcohol use: No  . Drug use: No  . Sexual activity: Not Currently  Other Topics Concern  . Not on file  Social History Narrative  . Not on file   Social Determinants of Health   Financial Resource Strain: Not on file  Food Insecurity: Not on file  Transportation Needs: Not on file  Physical Activity: Not on file  Stress: Not on file  Social Connections: Not on file   Past Surgical History:  Procedure Laterality Date  . ABDOMINAL HYSTERECTOMY     ?Lt. ovary remains  . APPENDECTOMY    . Arm surgery    . BREAST BIOPSY Right 10/10/2009  . BREAST CYST ASPIRATION  12/20/2013  . BREAST EXCISIONAL BIOPSY Left 1998  . BREAST EXCISIONAL BIOPSY Right 1993    Past Medical History:  Diagnosis Date  . Asthma   . Depression   . Diabetes mellitus without complication ([place])   . History of surgery on arm    right arm ( plate and screws)  . Hypertension   . Macular degeneration   . Migraine    without aura  . Urinary incontinence    There were no vitals taken for this visit.  Opioid Risk Score:   Fall Risk Score:  `1  Depression screen PHQ 2/9  Depression screen [facility] 2/9 12/31/2020 12/11/2020 12/10/2020 11/15/2020 07/19/2020 06/04/2020 05/10/2020  Decreased Interest 2 0 1 3 3  0 0  Down, Depressed, Hopeless 2 0 0 0 1 0 0  PHQ - 2 Score 4 0 1 3 4  0 0  Altered sleeping 2 - 0 0 0 0 1  Tired, decreased energy 2 - 2 0 3 1 1   Change in appetite 2 - 0 0 3 0 1  Feeling bad or failure about yourself  0 - 0 0 2 0 0  Trouble concentrating 2 - 0 0 2 0 0  Moving slowly or fidgety/restless 0 - 0 0 3 0 0  Suicidal thoughts 0 - 0 0 - 0 0  PHQ-9 Score 12 - 3 3 17 1 3   Difficult doing work/chores - - - Not difficult at all - Somewhat difficult -  Some recent data might be hidden    Review of Systems  Musculoskeletal: Positive for back pain and gait problem.       Spasms  All other systems reviewed and are negative.      Objective:   Physical Exam Gen: no distress, normal appearing HEENT: oral mucosa pink and moist, NCAT Cardio: Reg rate Chest: normal effort, normal rate of breathing Abd: soft, non-distended Ext: no edema Psych: pleasant, normal affect Skin: intact Neuro: Alert and oriented x3 Musculoskeletal: Pain reproduced with lumbar extension and standing, tenderness to palpation over bilateral lower lumbar paraspinals as well as right sided thoracic paraspinal. Negative slump test bilaterally. Antalgic gait, required wheelchair transport due to severity of thoracic paraspinal.   Assessment & Plan:  [name] is a 80 year old woman who presents to establish care for the following conditions:  1) right sided thoracic muscle spasm: -d/c  robaxin since not helping. -recommend applying blue emu oil -thoracic spine XR ordered to assess for compression fracture.  -increase mobic to 7.5mg  BID, discussed side effects -prescribed lidocaine patch -MRI thoracic spine reviewed with patient and son (from 2021): shows spondylosis -take tylenol 650mg  TID -continue balcofen  10mg  TID but take only at night if sedated -discussed results of thoracic spine XR -trigger point injection to right upper back next visit with me.   2) bilateral lumbar facet arthrosis: --MBB b/l is indicated for the following levels: b/l L4-L5 and L5-S1. The patient has severe low back pain that affects QOL. The patient has failed 6 months of conservative care with physical therapy (for a different issue, she cannot due for back since only permitted to target one body area at a time), medications, activity and lifestyle modification. MRI was performed and shows severe facet arthropathy at these levels. The patient has never had MBB before. F/u with Dr. Danae Chen for MBB at next available appointment  3) constipation: -advised magnesium citrate  10 minutes spent in discussion of XR results, appointment today with medical doctor, shortness of breath, lidocaine cream and sending script, trigger point injection next visit with me, injection with Dr. Letta Pate for facet arthrosis medial branch block

## 2020-12-31 NOTE — Progress Notes (Signed)
    SUBJECTIVE:   CHIEF COMPLAINT / HPI:   Grief, fall and polypharmacy 1. Grief: Husband died two weeks ago.  Family has rallied to support her, which has helped identify problems 2. Fal/syncope from toilet.  Although she does not remember, son was able to figure out that she had unintentionally taken way too many muscle relaxers for there back pain (52 muscle relaxers in three days!) 3. Chronic back pain, severe.  Sees pain management.  This has led to polypharmacy. 4. DM on higher dose of GLP.  Good weight loss.  Not peeing as much, probably less glycosuria.  Home blood sugars are great 5. Hypertension: BP a bit high here.  Home BPs are great.  Most of visit was spent reviewing meds and educating son which med did what.  Goal is to simplify med regimen and eliminate [place] drugs.  OBJECTIVE:   BP (!) 162/86   Pulse (!) 108   Ht 4\' 10"  (1.473 m)   Wt 174 lb 3.2 oz (79 kg)   SpO2 97%   BMI 36.41 kg/m   RRR without m or g Lungs clear   ASSESSMENT/PLAN:   Adjustment reaction with anxiety and depression Not currently on cymbalta.  I would consider restarting, perhaps at the 30 mg dose for both depression and neuropathic pain  Chronic right-sided thoracic back pain Chronic pain is important and we do not want the cure to be worse than the disease.  Explained PT is always a safe therapy choice.  Polypharmacy Together, we cleaned up med list as best we could.  Still has three urge incontinence meds on list.  Also has two different ACE doses listed.  They will return in 2 weeks or so to further clean up list.    HYPERTENSION, BENIGN SYSTEMIC Seems good by home readings.  Non-insulin dependent type 2 diabetes mellitus ([place]) Great weight loss and home BS.  No change in treatment.     Zenia Resides, MD [place]

## 2020-12-31 NOTE — Assessment & Plan Note (Signed)
Seems good by home readings.

## 2021-01-01 ENCOUNTER — Telehealth: Payer: Self-pay

## 2021-01-01 DIAGNOSIS — B373 Candidiasis of vulva and vagina: Secondary | ICD-10-CM

## 2021-01-01 DIAGNOSIS — N3001 Acute cystitis with hematuria: Secondary | ICD-10-CM

## 2021-01-01 DIAGNOSIS — B3731 Acute candidiasis of vulva and vagina: Secondary | ICD-10-CM

## 2021-01-01 MED ORDER — FLUCONAZOLE 150 MG PO TABS: 150.0000 mg | ORAL_TABLET | Freq: Once | ORAL | 0 refills | Status: AC

## 2021-01-01 MED ORDER — CEPHALEXIN 500 MG PO CAPS: 500.0000 mg | ORAL_CAPSULE | Freq: Four times a day (QID) | ORAL | 0 refills | Status: AC

## 2021-01-01 NOTE — Telephone Encounter (Signed)
Her UA pos for nitrates got lost in the shuffle on her last visit.  Main sx is hesitancy and the sensation of incomplete voiding.  Will Rx as UTI.  Also to to stop myrbetric and [place]

## 2021-01-01 NOTE — Assessment & Plan Note (Signed)
Will Rx as UTI

## 2021-01-01 NOTE — Assessment & Plan Note (Signed)
Rx after antibiotic therapy

## 2021-01-01 NOTE — Telephone Encounter (Signed)
Patient calls nurse line requesting to speak with Dr. Andria Frames regarding urine test from yesterday.   Please advise.   Talbot Grumbling, RN

## 2021-01-09 ENCOUNTER — Telehealth: Payer: Self-pay | Admitting: *Deleted

## 2021-01-09 NOTE — Telephone Encounter (Signed)
[name] called and she is still in excruciating pain because the medication is not working.  She would like to speak with [name]. I have told her she is not in the office today but will forward to her inbox. [name] has an appt for back injection tomorrow but it will be after Dr Beatriz Chancellor office for the day.  Ph # [phone]

## 2021-01-10 ENCOUNTER — Ambulatory Visit: Payer: HMO | Admitting: Physical Medicine and Rehabilitation

## 2021-01-10 ENCOUNTER — Encounter: Payer: HMO | Admitting: Physical Medicine & Rehabilitation

## 2021-01-10 NOTE — Telephone Encounter (Signed)
Calling now

## 2021-01-14 ENCOUNTER — Ambulatory Visit (INDEPENDENT_AMBULATORY_CARE_PROVIDER_SITE_OTHER): Payer: HMO | Admitting: Family Medicine

## 2021-01-14 ENCOUNTER — Ambulatory Visit (INDEPENDENT_AMBULATORY_CARE_PROVIDER_SITE_OTHER): Payer: HMO

## 2021-01-14 ENCOUNTER — Encounter: Payer: Self-pay | Admitting: Family Medicine

## 2021-01-14 ENCOUNTER — Other Ambulatory Visit: Payer: Self-pay

## 2021-01-14 DIAGNOSIS — Z23 Encounter for immunization: Secondary | ICD-10-CM | POA: Diagnosis not present

## 2021-01-14 DIAGNOSIS — Z79899 Other long term (current) drug therapy: Secondary | ICD-10-CM

## 2021-01-14 DIAGNOSIS — F4323 Adjustment disorder with mixed anxiety and depressed mood: Secondary | ICD-10-CM | POA: Diagnosis not present

## 2021-01-14 DIAGNOSIS — M546 Pain in thoracic spine: Secondary | ICD-10-CM | POA: Diagnosis not present

## 2021-01-14 DIAGNOSIS — G8929 Other chronic pain: Secondary | ICD-10-CM | POA: Diagnosis not present

## 2021-01-14 DIAGNOSIS — E119 Type 2 diabetes mellitus without complications: Secondary | ICD-10-CM | POA: Diagnosis not present

## 2021-01-14 MED ORDER — TOPIRAMATE 50 MG PO TABS: 50.0000 mg | ORAL_TABLET | Freq: Every day | ORAL | Status: DC

## 2021-01-14 MED ORDER — OZEMPIC (1 MG/DOSE) 2 MG/1.5ML ~~LOC~~ SOPN
2.0000 mg | PEN_INJECTOR | SUBCUTANEOUS | Status: DC
Start: 2021-01-14 — End: 2023-10-26

## 2021-01-14 NOTE — Patient Instructions (Signed)
I cleaned up your med list. Someone should call with the physical therapy. See me in 6 weeks to see if we can tweak things.

## 2021-01-15 ENCOUNTER — Encounter: Payer: Self-pay | Admitting: Family Medicine

## 2021-01-15 ENCOUNTER — Encounter: Payer: Self-pay | Admitting: Physical Medicine and Rehabilitation

## 2021-01-15 ENCOUNTER — Encounter: Payer: HMO | Attending: Physical Medicine and Rehabilitation | Admitting: Physical Medicine and Rehabilitation

## 2021-01-15 ENCOUNTER — Other Ambulatory Visit: Payer: Self-pay

## 2021-01-15 DIAGNOSIS — M7918 Myalgia, other site: Secondary | ICD-10-CM | POA: Diagnosis not present

## 2021-01-15 NOTE — Assessment & Plan Note (Signed)
Stable to worse.  Followed by pain managemnt.  They will follow.

## 2021-01-15 NOTE — Progress Notes (Signed)
    SUBJECTIVE:   CHIEF COMPLAINT / HPI:   FU of my visit from 12/31/20.  Generally better except for pain.  Urinating normally.  Weight actually down one pound.  No further lightheaded spells.  No falls.  Mentation is clearer.  Family (son) was not compelled to accompany her this visit.  She helped me further clean up her med list.  Back pain (Right lower thoracic) is severe and disrupting the quality of her life.  She has tried multiple topicals without relief.  Now working with pain management.  Next step is trigger point injection.  She, of course, still misses her husband.  She feels that she is slowly improving.  Not worse.      OBJECTIVE:   BP 138/82   Pulse 93   Ht 4\' 10"  (1.473 m)   Wt 173 lb (78.5 kg)   SpO2 95%   BMI 36.16 kg/m    Lungs clear Cardiac RRR without m or g Hyperesthesia over right lower thoracic (subscapular area)  No mass or rash.  ASSESSMENT/PLAN:   Adjustment reaction with anxiety and depression Stable to slow improvement.    Chronic right-sided thoracic back pain Stable to worse.  Followed by pain managemnt.  They will follow.  Polypharmacy We have made great strides in improving her multiple medications.     Zenia Resides, MD [place]

## 2021-01-15 NOTE — Assessment & Plan Note (Signed)
>>  ASSESSMENT AND PLAN FOR ADJUSTMENT REACTION WITH ANXIETY AND DEPRESSION WRITTEN ON 01/15/2021  5:01 PM BY HENSEL, ELSIE LABOR, MD  Stable to slow improvement.

## 2021-01-15 NOTE — Progress Notes (Signed)
Trigger Point Injection  Indication: Thoracic myofascial pain not relieved by medication management and other conservative care.  Informed consent was obtained after describing risk and benefits of the procedure with the patient, this includes bleeding, bruising, infection and medication side effects.  The patient wishes to proceed and has given written consent.  The patient was placed in a seated position.  The cervical area was marked and prepped with Betadine.  It was entered with a 25-gauge 1/2 inch needle and a total of 5 mL of 1% lidocaine and normal saline was injected into a total of 3 trigger points, after negative draw back for blood.  The patient tolerated the procedure well.  Post procedure instructions were given.

## 2021-01-15 NOTE — Assessment & Plan Note (Signed)
Stable to slow improvement.

## 2021-01-15 NOTE — Assessment & Plan Note (Signed)
We have made great strides in improving her multiple medications.

## 2021-01-16 ENCOUNTER — Other Ambulatory Visit (HOSPITAL_BASED_OUTPATIENT_CLINIC_OR_DEPARTMENT_OTHER): Payer: Self-pay | Admitting: *Deleted

## 2021-01-16 ENCOUNTER — Telehealth (HOSPITAL_BASED_OUTPATIENT_CLINIC_OR_DEPARTMENT_OTHER): Payer: Self-pay | Admitting: *Deleted

## 2021-01-16 ENCOUNTER — Other Ambulatory Visit: Payer: Self-pay

## 2021-01-16 ENCOUNTER — Other Ambulatory Visit: Payer: Self-pay | Admitting: Physical Medicine and Rehabilitation

## 2021-01-16 DIAGNOSIS — E7800 Pure hypercholesterolemia, unspecified: Secondary | ICD-10-CM

## 2021-01-16 MED ORDER — ROSUVASTATIN CALCIUM 20 MG PO TABS: ORAL_TABLET | ORAL | 3 refills | Status: DC

## 2021-01-16 NOTE — Telephone Encounter (Signed)
Pharmacy notified that PA for Gemtesa had been approved. Pharmacy states that the cost of the medication will still be $113.

## 2021-01-18 ENCOUNTER — Telehealth: Payer: Self-pay | Admitting: *Deleted

## 2021-01-18 NOTE — Telephone Encounter (Signed)
Mrs Niemeier is calling to report to [name] that the injection she gave her is no working. Mrs Dacquisto says that [name] told her to call her personally.  I have explained that [name] is out of the office on vacation day and she will most likely not receive message until she returns on Monday. Mrs Ex[place]us states she understands.

## 2021-01-23 ENCOUNTER — Encounter: Payer: HMO | Admitting: Physical Medicine and Rehabilitation

## 2021-01-23 ENCOUNTER — Other Ambulatory Visit: Payer: Self-pay

## 2021-01-23 VITALS — BP 157/88 | HR 100 | Temp 98.9°F | Ht <= 58 in | Wt 171.8 lb

## 2021-01-23 DIAGNOSIS — M7918 Myalgia, other site: Secondary | ICD-10-CM | POA: Diagnosis not present

## 2021-01-23 NOTE — Progress Notes (Signed)
Trigger Point Injection  Indication: Thoracic myofascial pain not relieved by medication management and other conservative care.  Informed consent was obtained after describing risk and benefits of the procedure with the patient, this includes bleeding, bruising, infection and medication side effects.  The patient wishes to proceed and has given written consent.  The patient was placed in a seated position.  The cervical area was marked and prepped with Betadine.  It was entered with a 25-gauge 1/2 inch needle and a total of 5 mL of 1% lidocaine was injected into a total of 3 trigger points, after negative draw back for blood.  The patient tolerated the procedure well.  Post procedure instructions were given.

## 2021-01-24 ENCOUNTER — Telehealth: Payer: Self-pay | Admitting: Physical Medicine and Rehabilitation

## 2021-01-24 NOTE — Telephone Encounter (Signed)
[name] states she is still in pain after the shots yesterday.  Advised we would let [name] know. Patient has been calling [facility]'s line directly a;sp advised to call main line so we can get her calls in a timely fashion.

## 2021-01-30 ENCOUNTER — Ambulatory Visit (INDEPENDENT_AMBULATORY_CARE_PROVIDER_SITE_OTHER): Payer: HMO | Admitting: Family Medicine

## 2021-01-30 ENCOUNTER — Encounter: Payer: Self-pay | Admitting: Family Medicine

## 2021-01-30 ENCOUNTER — Other Ambulatory Visit: Payer: Self-pay

## 2021-01-30 DIAGNOSIS — M48062 Spinal stenosis, lumbar region with neurogenic claudication: Secondary | ICD-10-CM | POA: Diagnosis not present

## 2021-01-30 DIAGNOSIS — F4323 Adjustment disorder with mixed anxiety and depressed mood: Secondary | ICD-10-CM | POA: Diagnosis not present

## 2021-01-30 DIAGNOSIS — G43909 Migraine, unspecified, not intractable, without status migrainosus: Secondary | ICD-10-CM | POA: Diagnosis not present

## 2021-01-30 DIAGNOSIS — R296 Repeated falls: Secondary | ICD-10-CM

## 2021-01-30 NOTE — Patient Instructions (Signed)
I am delighted that [name]'s celebration of life went so well.   I hope the physical therapy helps. Keep working on increasing your activity. I look forward to our next visit. Remember to evaluate the treatments.  Don't keep doing them if they are not helping.   Baclofen is a safe muscle relaxer Cymbalta is for depression and nerve pain Topimax is for migraine prevention and nerve pain.

## 2021-01-31 ENCOUNTER — Encounter: Payer: Self-pay | Admitting: Family Medicine

## 2021-01-31 NOTE — Progress Notes (Signed)
    SUBJECTIVE:   CHIEF COMPLAINT / HPI:   This visit has mostly to do with family dynamics.  [name] comes alone today.  She drove for the first time in a while.  There is tension between her and her son over medications.  Since [name], [name], the son has been parenting his mom.  He is adament about her getting off pretty much all pain meds.  This is driven somewhat by an episode after [name] where son needed to help mom out of bathtub.  Of note, [name] has many longstanding pain complaints which include headaches (has been seen by the headache center and is currently taking topamax as prescribed by them.  Also has chronic back pain and is being seen by the pain center.  They prescribe baclofen, cymbalta and melxicam.  Note: she is not on any controled substances.  She is also on less meds than she has been for the majority of time that I have been seeing her.  She emphasizes that she needs these meds to function.  Next visit will be with both her and her son  No recent falls  Did not get much, if any, relief from the trigger point injections given by pain management.  On the bright side, they recently held a Celebration of Life event for [name].  To [name]'s great relief, the event was wonderful.     OBJECTIVE:   BP (!) 142/80   Pulse 82   Ht 4\' 10"  (1.473 m)   Wt 172 lb (78 kg)   SpO2 97%   BMI 35.95 kg/m   Gen: [name] is more energetic than I have seen her for years.  I believe the decrease in polypharmacy last visit helped significantly.  Wt remains down. Lungs clear Cardiac RRR without m or g Neuro, Motor, sensory, gait, mentation and affect all grossly normal. ASSESSMENT/PLAN:   Adjustment reaction with anxiety and depression Overall improvement with less meds, the passage of time and the successful celebration of life event.  Frequent falls Improving.    Migraine headache States needs topamax or migraines come back  with a vengence.    Spinal stenosis of lumbar region Chronic pain, unchanged.  Largely managed by pain management.     Debra Resides, MD [place]

## 2021-01-31 NOTE — Assessment & Plan Note (Signed)
Chronic pain, unchanged.  Largely managed by pain management.

## 2021-01-31 NOTE — Assessment & Plan Note (Signed)
Improving.

## 2021-01-31 NOTE — Assessment & Plan Note (Signed)
States needs topamax or migraines come back with a vengence.

## 2021-01-31 NOTE — Assessment & Plan Note (Signed)
>>  ASSESSMENT AND PLAN FOR ADJUSTMENT REACTION WITH ANXIETY AND DEPRESSION WRITTEN ON 01/31/2021  4:31 PM BY HENSEL, ELSIE LABOR, MD  Overall improvement with less meds, the passage of time and the successful celebration of life event.

## 2021-01-31 NOTE — Assessment & Plan Note (Signed)
Overall improvement with less meds, the passage of time and the successful celebration of life event.

## 2021-02-04 ENCOUNTER — Telehealth: Payer: Self-pay | Admitting: *Deleted

## 2021-02-04 NOTE — Telephone Encounter (Signed)
[name] says that [name] says that she can take her baclofen tid.  Her Rx only allows daily.  She is going on vacation and is asking for a refill on it so that she can take tid (#90).  Please advise.

## 2021-02-05 MED ORDER — BACLOFEN 10 MG PO TABS: 10.0000 mg | ORAL_TABLET | Freq: Three times a day (TID) | ORAL | 1 refills | Status: DC | PRN

## 2021-02-05 NOTE — Telephone Encounter (Signed)
Notified. 

## 2021-02-13 ENCOUNTER — Ambulatory Visit: Payer: HMO | Admitting: Physical Therapy

## 2021-02-15 ENCOUNTER — Other Ambulatory Visit: Payer: Self-pay | Admitting: Physical Medicine and Rehabilitation

## 2021-02-20 ENCOUNTER — Ambulatory Visit: Payer: HMO | Attending: Family Medicine | Admitting: Physical Therapy

## 2021-02-20 ENCOUNTER — Encounter: Payer: Self-pay | Admitting: Physical Therapy

## 2021-02-20 ENCOUNTER — Other Ambulatory Visit: Payer: Self-pay

## 2021-02-20 DIAGNOSIS — M545 Low back pain, unspecified: Secondary | ICD-10-CM | POA: Diagnosis not present

## 2021-02-20 DIAGNOSIS — G8929 Other chronic pain: Secondary | ICD-10-CM | POA: Diagnosis not present

## 2021-02-20 DIAGNOSIS — M25511 Pain in right shoulder: Secondary | ICD-10-CM | POA: Insufficient documentation

## 2021-02-20 DIAGNOSIS — R262 Difficulty in walking, not elsewhere classified: Secondary | ICD-10-CM | POA: Diagnosis not present

## 2021-02-20 DIAGNOSIS — R293 Abnormal posture: Secondary | ICD-10-CM | POA: Diagnosis not present

## 2021-02-20 DIAGNOSIS — M25562 Pain in left knee: Secondary | ICD-10-CM | POA: Diagnosis not present

## 2021-02-20 DIAGNOSIS — M62830 Muscle spasm of back: Secondary | ICD-10-CM

## 2021-02-20 NOTE — Therapy (Signed)
[place]. [place], [place], [address] Phone: [phone]   Fax:  [phone]  Physical Therapy Evaluation  Patient Details  Name: [name] MRN: [id number] Date of Birth: 1941/02/10 Referring Provider (PT): Hensel   Encounter Date: 02/20/2021   PT End of Session - 02/20/21 1524     Visit Number 1    Date for PT Re-Evaluation 05/23/21    Authorization Type Health team advantage    PT Start Time 2536    PT Stop Time 1540    PT Time Calculation (min) 48 min    Activity Tolerance Patient limited by pain    Behavior During Therapy [facility] for tasks assessed/performed             Past Medical History:  Diagnosis Date   Asthma    Depression    Diabetes mellitus without complication ([place])    History of surgery on arm    right arm ( plate and screws)   Hypertension    Macular degeneration    Migraine    without aura   Urinary incontinence     Past Surgical History:  Procedure Laterality Date   ABDOMINAL HYSTERECTOMY     ?Lt. ovary remains   APPENDECTOMY     Arm surgery     BREAST BIOPSY Right 10/10/2009   BREAST CYST ASPIRATION  12/20/2013   BREAST EXCISIONAL BIOPSY Left 1998   BREAST EXCISIONAL BIOPSY Right 1993    There were no vitals filed for this visit.    Subjective Assessment - 02/20/21 1503     Subjective Patient has been seen here previously, for back pain, she was not able to come often due to caring for her husband.  She reports that he got very sick in 11/17/22 and passed away 2023/01/07.  She reports "tough going married 60 years"  Reports that she is having leg pain.    Limitations Standing;Walking;House hold activities    How long can you stand comfortably? 5-10 minutes    How long can you walk comfortably? can't shop    Patient Stated Goals have less pain    Currently in Pain? Yes    Pain Score 8     Pain Location Back    Pain Orientation Upper;Mid;Lower;Right    Pain Descriptors  / Indicators Stabbing    Pain Radiating Towards has some leg pain    Pain Onset More than a month ago    Pain Frequency Constant    Aggravating Factors  standing, sitting, walking, bending pain >10/10    Pain Relieving Factors has tried OTC pain meds and prescription pain meds pain can be 5/10    Effect of Pain on Daily Activities limits all ADL's                [facility] PT Assessment - 02/20/21 0001       Assessment   Medical Diagnosis back pain, leg pain    Referring Provider (PT) Hensel    Onset Date/Surgical Date 01/21/21    Prior Therapy PT for back      Precautions   Precautions None      Restrictions   Weight Bearing Restrictions No      Balance Screen   Has the patient fallen in the past 6 months Yes    How many times? 3    Has the patient had a decrease in activity level because of a fear of falling?  No  Is the patient reluctant to leave their home because of a fear of falling?  Yes      [place] residence    Living Arrangements Alone    Available Help at Discharge Family    Type of [place] to enter    [place] One level    Additional Comments does some housework and some cooking      Prior Function   Level of Independence Independent with basic ADLs    Vocation Retired    Leisure no exercise      Cognition   Overall Cognitive Status Within Functional Limits for tasks assessed      Posture/Postural Control   Posture Comments tends to lean to the right, fwd, head, rounded shoulders      AROM   Overall AROM Comments cervical ROM decreased 50% with pain i th eneck and the thoracic area, lumbar ROM decreased 75% with pain in the low back,      Strength   Overall Strength Comments shoulders 3+/5 with pain in the shoulders, neck and upper back,    Right Hip Flexion 3/5    Right Hip External Rotation  4-/5    Right Hip Internal Rotation 4-/5    Right Hip ABduction 4-/5    Right Hip  ADduction 4-/5    Left Hip Flexion 3/5    Left Hip External Rotation 4-/5    Left Hip Internal Rotation 4-/5    Left Hip ABduction 4-/5      Palpation   Palpation comment has a knot in the right thoracic area, very tender in the lumbar, thoracic and cervical area, upper traps      Ambulation/Gait   Gait Comments no device slow, limps on the right some      Timed Up and Go Test   Normal TUG (seconds) 35                        Objective measurements completed on examination: See above findings.       [facility] Adult PT Treatment/Exercise - 02/20/21 0001       Modalities   Modalities Electrical Stimulation      Electrical Stimulation   Electrical Stimulation Location low back and right thoracic area    Electrical Stimulation Action IFC    Electrical Stimulation Parameters sitting    Electrical Stimulation Goals Pain                      PT Short Term Goals - 02/20/21 1529       PT SHORT TERM GOAL #1   Title Pt will be independent with initial HEP    Time 4    Period Weeks    Status New               PT Long Term Goals - 02/20/21 1529       PT LONG TERM GOAL #1   Title Patient is independent with advanced HEP    Time 12    Period Weeks    Status New      PT LONG TERM GOAL #2   Title increase cervical ROM 25%    Time 12    Period Weeks    Status New      PT LONG TERM GOAL #3   Title increase lumbar ROM 25%    Time 12  Period Weeks    Status New      PT LONG TERM GOAL #4   Title decresae TUG to 20 seconds    Time 12    Period Weeks    Status New      PT LONG TERM GOAL #5   Title decrease pain overall 25%    Time 12    Period Weeks    Status New                    Plan - 02/20/21 1525     Clinical Impression Statement Patient has had Low back pain, thoracic, neck and leg pain for a number of years, worse over the lsat few with her doing care for her husband, he passed away in 01-16-23, she reports that she  has had some falls.  She is very tgiht and tender in the above areas, she tends to lean to the right has rounded shoulder and slouched posture.  Has weakness and decreased ROM, reports difficulty with standing, reports tha she has others shop for her.  Difficulty standing to cook a meal    Personal Factors and Comorbidities Fitness;Age;Past/Current Experience    Comorbidities diabetes; abdominal hysterectomy; macular degeneration    Examination-Activity Limitations Continence;Toileting;Sit;Stand;Squat;Carry;Locomotion Level;Lift    Stability/Clinical Decision Making Stable/Uncomplicated    Clinical Decision Making Low    Rehab Potential Good    PT Frequency 2x / week    PT Duration 12 weeks    PT Treatment/Interventions ADLs/Self Care Home Management;Cryotherapy;Electrical Stimulation;Moist Heat;Stair training;Functional mobility training;Therapeutic activities;Therapeutic exercise;Balance training;Neuromuscular re-education;Manual techniques;Patient/family education;Dry needling    PT Next Visit Plan work on strength and funciton and help pain if we can    Consulted and Agree with Plan of Care Patient             Patient will benefit from skilled therapeutic intervention in order to improve the following deficits and impairments:  Decreased coordination, Increased fascial restricitons, Decreased endurance, Increased muscle spasms, Decreased activity tolerance, Decreased strength, Difficulty walking, Decreased range of motion, Pain, Decreased balance, Impaired flexibility, Improper body mechanics, Postural dysfunction, Decreased mobility  Visit Diagnosis: Difficulty in walking, not elsewhere classified - Plan: PT plan of care cert/re-cert  Chronic bilateral low back pain without sciatica - Plan: PT plan of care cert/re-cert  Muscle spasm of back - Plan: PT plan of care cert/re-cert  Abnormal posture - Plan: PT plan of care cert/re-cert  Acute pain of right shoulder - Plan: PT plan of  care cert/re-cert  Chronic pain of left knee - Plan: PT plan of care cert/re-cert     Problem List Patient Active Problem List   Diagnosis Date Noted   Polypharmacy 12/31/2020   Asthma exacerbation 11/28/2020   Ear fullness, bilateral 05/11/2020   Financial difficulties 11/16/2019   Osteoarthritis of left knee 06/13/2019   Rotator cuff syndrome of right shoulder 01/21/2019   Bladder prolapse, female, acquired 08/16/2018   Urinary incontinence 07/05/2018   Frequent falls 12/24/2017   Adjustment reaction with anxiety and depression 85/63/1497   Ruptured silicone breast implant 12/08/2016   Cough 03/03/2016   Iron deficiency anemia due to chronic blood loss 10/31/2015   Chronic right-sided thoracic back pain 07/26/2015   Spinal stenosis of lumbar region 07/10/2015   Abdominal aortic atherosclerosis ([place]) 07/10/2015   UTI (urinary tract infection) 09/07/2013   Sleep apnea 05/22/2010   PERIPHERAL NEUROPATHY 02/06/2010   PULMONARY NODULE, SOLITARY 07/20/2009   Irritable bowel syndrome 11/12/2007   Non-insulin dependent  type 2 diabetes mellitus ([place]) 10/08/2006   HYPERCHOLESTEROLEMIA 10/08/2006   HYPERTRIGLYCERIDEMIA 10/08/2006   Migraine headache 10/08/2006   CARPAL TUNNEL SYNDROME 10/08/2006   HYPERTENSION, BENIGN SYSTEMIC 10/08/2006   ASTHMA, PERSISTENT, MODERATE 10/08/2006   REFLUX ESOPHAGITIS 10/08/2006   Eczema 10/08/2006   Psoriasis 10/08/2006   Osteoarthritis of spine 10/08/2006   VERTIGO NOS OR DIZZINESS 10/08/2006    Sumner Boast., PT 02/20/2021, 3:36 PM  [place]. [place], [place], [address] Phone: [phone]   Fax:  [phone]  Name: [name] MRN: [id number] Date of Birth: [birth year]

## 2021-02-21 ENCOUNTER — Ambulatory Visit: Payer: HMO | Admitting: Physical Medicine and Rehabilitation

## 2021-02-26 ENCOUNTER — Other Ambulatory Visit: Payer: Self-pay | Admitting: Family Medicine

## 2021-02-26 DIAGNOSIS — Z1231 Encounter for screening mammogram for malignant neoplasm of breast: Secondary | ICD-10-CM

## 2021-02-28 ENCOUNTER — Ambulatory Visit (INDEPENDENT_AMBULATORY_CARE_PROVIDER_SITE_OTHER): Payer: HMO | Admitting: Family Medicine

## 2021-02-28 ENCOUNTER — Encounter: Payer: Self-pay | Admitting: Family Medicine

## 2021-02-28 ENCOUNTER — Other Ambulatory Visit: Payer: Self-pay

## 2021-02-28 DIAGNOSIS — R42 Dizziness and giddiness: Secondary | ICD-10-CM | POA: Diagnosis not present

## 2021-02-28 DIAGNOSIS — E7800 Pure hypercholesterolemia, unspecified: Secondary | ICD-10-CM | POA: Diagnosis not present

## 2021-02-28 DIAGNOSIS — H938X3 Other specified disorders of ear, bilateral: Secondary | ICD-10-CM

## 2021-02-28 DIAGNOSIS — E119 Type 2 diabetes mellitus without complications: Secondary | ICD-10-CM | POA: Diagnosis not present

## 2021-02-28 DIAGNOSIS — M179 Osteoarthritis of knee, unspecified: Secondary | ICD-10-CM | POA: Diagnosis not present

## 2021-02-28 DIAGNOSIS — R112 Nausea with vomiting, unspecified: Secondary | ICD-10-CM | POA: Diagnosis not present

## 2021-02-28 DIAGNOSIS — G43909 Migraine, unspecified, not intractable, without status migrainosus: Secondary | ICD-10-CM

## 2021-02-28 LAB — POCT GLYCOSYLATED HEMOGLOBIN (HGB A1C): HbA1c, POC (controlled diabetic range): 6.2 % (ref 0.0–7.0)

## 2021-02-28 MED ORDER — ONDANSETRON HCL 4 MG PO TABS: 4.0000 mg | ORAL_TABLET | Freq: Three times a day (TID) | ORAL | 4 refills | Status: DC | PRN

## 2021-02-28 MED ORDER — HYDROCORTISONE-ACETIC ACID 1-2 % OT SOLN: 3.0000 [drp] | Freq: Two times a day (BID) | OTIC | 3 refills | Status: DC

## 2021-02-28 NOTE — Assessment & Plan Note (Signed)
Prescribing hydrocortisone drops for inner ear irritation

## 2021-02-28 NOTE — Assessment & Plan Note (Signed)
HbA1C today was 6.2 Continue current medication regimen

## 2021-02-28 NOTE — Progress Notes (Signed)
    SUBJECTIVE:   CHIEF COMPLAINT / HPI:   [name] is here for follow up for a number of items largely related to recent life changes.  [name] is doing well since her husband's death two months ago. She is still appropriately sad but is finding ways to spend time with family and find reasons to enjoy life. She recently spent a week with them and is looking forward to her youngest great grandchild's baptism in a month.  Her acute complaints today include an ear itch which she believes is related to her psoriasis. It is internal and has been particularly bothersome lately. She has also developed pruritis related to her topiramate that has an onset 45 minutes to an hour after she takes it. The pruritis is distinct from her psoriasis itch and is so bothersome that she needs to take Benadryl.   Regarding her diabetes, Ms .[name] wants to know if she can get her HbA1c done today. Her last HbA1c was 5/3 so she is still two weeks away from the 3 month period.  [name] has a few chronic health problems that are bothering her today. Her right knee is starting to bother her like her left knee did. She inquired about possible cortisone shots. She continues to have persistent nausea and imbalance, alongside back pain.    PERTINENT  PMH / PSH:  Migraine headache Hypertension Diabetes Psoriasis Osteoarthritis of the knee   OBJECTIVE:   BP 136/72   Pulse 92   Ht 4\' 10"  (1.473 m)   Wt 173 lb (78.5 kg)   SpO2 94%   BMI 36.16 kg/m   GEN: Well appearing, no acute distress HEENT: NCAT, slight erythema in both ears near tympanic membrane. Tympanic membranes, pearly white and no erythema CARD: RRR, no murmurs or gallops PULM: Clear to auscultation, normal work of breathing GI: soft, non tender to palpation PSYCH: mood is "ok"  ASSESSMENT/PLAN:   VERTIGO NOS OR DIZZINESS Sent refill for Zofran  Non-insulin dependent type 2 diabetes mellitus (HCC) HbA1C today was 6.2 Continue  current medication regimen  Osteoarthritis of knee, unspecified Recommend use of cane if right knee continues to be a bother If both knees start to irritate, recommend using walker that husband used to use Discussed cortisone shots as a short term solution to the long term pathology, and do not recommend at this time   Migraine headache Recommend step wise taper down of topiramate (1 tablet each week) given persistent pruritis Will assess need for additional medication based on response to coming off topiramate [name] believes she is taking another medication in the morning for migraines. We do not have it in our records but will connect via telephone tomorrow to confirm medication and make necessary med changes  Itching of ear, bilateral Prescribing hydrocortisone drops for inner ear irritation   Health Maintenance Routine CMP and LDL panel  [facility]

## 2021-02-28 NOTE — Assessment & Plan Note (Signed)
Recommend step wise taper down of topiramate (1 tablet each week) given persistent pruritis Will assess need for additional medication based on response to coming off topiramate Ms. Urey believes she is taking another medication in the morning for migraines. We do not have it in our records but will connect via telephone tomorrow to confirm medication and make necessary med changes

## 2021-02-28 NOTE — Assessment & Plan Note (Addendum)
Recommend use of cane if right knee continues to be a bother If both knees start to irritate, recommend using walker that husband used to use Discussed cortisone shots as a short term solution to the long term pathology, and do not recommend at this time

## 2021-02-28 NOTE — Assessment & Plan Note (Signed)
Sent refill for Zofran

## 2021-02-28 NOTE — Patient Instructions (Addendum)
For your knee, please get a neoprene sleeve and consider using a cane or a walker. Let's wean you off the topamax.  Take one pill per night for a week then stop altogether.   I will call tomorrow.  Check your meds carefully at home.  I don't show any antidepressant or other migraine stopping medication.  Tell me the morning pill that you think is covering this.   Great job with the diabetes.   I generally need to see you every three months.  Sooner if problems.

## 2021-03-01 ENCOUNTER — Encounter: Payer: Self-pay | Admitting: Family Medicine

## 2021-03-01 ENCOUNTER — Ambulatory Visit: Payer: HMO | Admitting: Obstetrics and Gynecology

## 2021-03-01 LAB — LIPID PANEL
Chol/HDL Ratio: 2.2 ratio (ref 0.0–4.4)
Cholesterol, Total: 158 mg/dL (ref 100–199)
HDL: 72 mg/dL (ref >39)
LDL Chol Calc (NIH): 67 mg/dL (ref 0–99)
Triglycerides: 108 mg/dL (ref 0–149)
VLDL Cholesterol Cal: 19 mg/dL (ref 5–40)

## 2021-03-01 LAB — CMP14+EGFR
ALT: 23 IU/L (ref 0–32)
AST: 35 IU/L (ref 0–40)
Albumin/Globulin Ratio: 2 (ref 1.2–2.2)
Albumin: 4.7 g/dL (ref 3.7–4.7)
Alkaline Phosphatase: 110 IU/L (ref 44–121)
BUN/Creatinine Ratio: 18 (ref 12–28)
BUN: 18 mg/dL (ref 8–27)
Bilirubin Total: 0.3 mg/dL (ref 0.0–1.2)
CO2: 22 mmol/L (ref 20–29)
Calcium: 9.6 mg/dL (ref 8.7–10.3)
Chloride: 106 mmol/L (ref 96–106)
Creatinine, Ser: 1.02 mg/dL — ABNORMAL HIGH (ref 0.57–1.00)
Globulin, Total: 2.4 g/dL (ref 1.5–4.5)
Glucose: 55 mg/dL — ABNORMAL LOW (ref 65–99)
Potassium: 4.2 mmol/L (ref 3.5–5.2)
Sodium: 142 mmol/L (ref 134–144)
Total Protein: 7.1 g/dL (ref 6.0–8.5)
eGFR: 56 mL/min/{1.73_m2} — ABNORMAL LOW (ref >59)

## 2021-03-01 NOTE — Progress Notes (Signed)
I was either physical present or repeated all elements of the H&PE by MS3 Guadelupe Sabin.  I agree with her documentation and management.  I also called Ms. Cramer with her reassuring LDL.  She informed me she had been taking Duloxetine 60 mg long term.  Neither of [place] knew how it had mysteriously dropped off the list.  Since duloxetine may also prevent migraines, we decided to just wean the topamax and only start another prophylactic med if she has increase of her migraines with weaning.

## 2021-03-04 ENCOUNTER — Ambulatory Visit: Payer: HMO | Admitting: Physical Therapy

## 2021-03-04 ENCOUNTER — Other Ambulatory Visit: Payer: Self-pay

## 2021-03-04 ENCOUNTER — Encounter: Payer: Self-pay | Admitting: Physical Therapy

## 2021-03-04 DIAGNOSIS — M545 Low back pain, unspecified: Secondary | ICD-10-CM

## 2021-03-04 DIAGNOSIS — G8929 Other chronic pain: Secondary | ICD-10-CM

## 2021-03-04 DIAGNOSIS — R262 Difficulty in walking, not elsewhere classified: Secondary | ICD-10-CM

## 2021-03-04 DIAGNOSIS — R293 Abnormal posture: Secondary | ICD-10-CM

## 2021-03-04 DIAGNOSIS — M25511 Pain in right shoulder: Secondary | ICD-10-CM

## 2021-03-04 DIAGNOSIS — M62830 Muscle spasm of back: Secondary | ICD-10-CM

## 2021-03-04 NOTE — Therapy (Signed)
[place]. [place], [place], [address] Phone: [phone]   Fax:  [phone]  Physical Therapy Treatment  Patient Details  Name: [name] MRN: [id number] Date of Birth: January 22, 1941 Referring Provider (PT): Hensel   Encounter Date: 03/04/2021   PT End of Session - 03/04/21 1406     Visit Number 2    Date for PT Re-Evaluation 05/23/21    Authorization Type Health team advantage    PT Start Time 1330    PT Stop Time 1417    PT Time Calculation (min) 47 min    Activity Tolerance Patient limited by pain    Behavior During Therapy [facility] for tasks assessed/performed             Past Medical History:  Diagnosis Date   Asthma    Depression    Diabetes mellitus without complication ([place])    History of surgery on arm    right arm ( plate and screws)   Hypertension    Macular degeneration    Migraine    without aura   Urinary incontinence     Past Surgical History:  Procedure Laterality Date   ABDOMINAL HYSTERECTOMY     ?Lt. ovary remains   APPENDECTOMY     Arm surgery     BREAST BIOPSY Right 10/10/2009   BREAST CYST ASPIRATION  12/20/2013   BREAST EXCISIONAL BIOPSY Left 1998   BREAST EXCISIONAL BIOPSY Right 1993    There were no vitals filed for this visit.   Subjective Assessment - 03/04/21 1335     Subjective Patient reports still hurting.  Came in 15 minutes late    Currently in Pain? Yes    Pain Score 5     Pain Location Back    Pain Orientation Mid;Upper                               [facility] Adult PT Treatment/Exercise - 03/04/21 0001       Exercises   Exercises Lumbar      Lumbar Exercises: Aerobic   Nustep level 4 x 5 minutes      Lumbar Exercises: Standing   Row Both;20 reps;Theraband    Theraband Level (Row) Level 2 (Red)    Shoulder Extension Theraband;20 reps    Theraband Level (Shoulder Extension) Level 2 (Red)    Other Standing Lumbar Exercises  back to wall overhead lift with light ball cues for posture and some help for end ranges      Electrical Stimulation   Electrical Stimulation Location low back and right thoracic area    Electrical Stimulation Action IFC    Electrical Stimulation Parameters sitting    Electrical Stimulation Goals Pain      Manual Therapy   Manual Therapy Soft tissue mobilization    Soft tissue mobilization to the Thoracic area and into the upper traps, used vibration at times                      PT Short Term Goals - 02/20/21 1529       PT SHORT TERM GOAL #1   Title Pt will be independent with initial HEP    Time 4    Period Weeks    Status New               PT Long Term Goals - 02/20/21 1529  PT LONG TERM GOAL #1   Title Patient is independent with advanced HEP    Time 12    Period Weeks    Status New      PT LONG TERM GOAL #2   Title increase cervical ROM 25%    Time 12    Period Weeks    Status New      PT LONG TERM GOAL #3   Title increase lumbar ROM 25%    Time 12    Period Weeks    Status New      PT LONG TERM GOAL #4   Title decresae TUG to 20 seconds    Time 12    Period Weeks    Status New      PT LONG TERM GOAL #5   Title decrease pain overall 25%    Time 12    Period Weeks    Status New                   Plan - 03/04/21 1407     Clinical Impression Statement Patient still with c/o pain in the thoracic area, she is very tender here and in the upper trap area, she is sensitive to palpation and does not like heat or ice.  Initiated exercises today    PT Next Visit Plan work on strength and funciton and help pain if we can    Consulted and Agree with Plan of Care Patient             Patient will benefit from skilled therapeutic intervention in order to improve the following deficits and impairments:  Decreased coordination, Increased fascial restricitons, Decreased endurance, Increased muscle spasms, Decreased activity  tolerance, Decreased strength, Difficulty walking, Decreased range of motion, Pain, Decreased balance, Impaired flexibility, Improper body mechanics, Postural dysfunction, Decreased mobility  Visit Diagnosis: Difficulty in walking, not elsewhere classified  Chronic bilateral low back pain without sciatica  Muscle spasm of back  Abnormal posture  Acute pain of right shoulder     Problem List Patient Active Problem List   Diagnosis Date Noted   Polypharmacy 12/31/2020   Asthma exacerbation 11/28/2020   Itching of ear, bilateral 05/11/2020   Financial difficulties 11/16/2019   Osteoarthritis of knee, unspecified 06/13/2019   Rotator cuff syndrome of right shoulder 01/21/2019   Bladder prolapse, female, acquired 08/16/2018   Urinary incontinence 07/05/2018   Frequent falls 12/24/2017   Adjustment reaction with anxiety and depression 0000000   Ruptured silicone breast implant 12/08/2016   Cough 03/03/2016   Iron deficiency anemia due to chronic blood loss 10/31/2015   Chronic right-sided thoracic back pain 07/26/2015   Spinal stenosis of lumbar region 07/10/2015   Abdominal aortic atherosclerosis ([place]) 07/10/2015   UTI (urinary tract infection) 09/07/2013   Sleep apnea 05/22/2010   PERIPHERAL NEUROPATHY 02/06/2010   PULMONARY NODULE, SOLITARY 07/20/2009   Irritable bowel syndrome 11/12/2007   Non-insulin dependent type 2 diabetes mellitus ([place]) 10/08/2006   HYPERCHOLESTEROLEMIA 10/08/2006   HYPERTRIGLYCERIDEMIA 10/08/2006   Migraine headache 10/08/2006   CARPAL TUNNEL SYNDROME 10/08/2006   HYPERTENSION, BENIGN SYSTEMIC 10/08/2006   ASTHMA, PERSISTENT, MODERATE 10/08/2006   REFLUX ESOPHAGITIS 10/08/2006   Eczema 10/08/2006   Psoriasis 10/08/2006   Osteoarthritis of spine 10/08/2006   VERTIGO NOS OR DIZZINESS 10/08/2006    Sumner Boast., PT 03/04/2021, 2:09 PM  [place]. [place], [place],  [address] Phone: [phone]   Fax:  [phone]  Name:  Pearlie Das MRN: [id number] Date of Birth: 23-May-1941

## 2021-03-05 ENCOUNTER — Ambulatory Visit (INDEPENDENT_AMBULATORY_CARE_PROVIDER_SITE_OTHER): Payer: HMO | Admitting: Obstetrics and Gynecology

## 2021-03-05 ENCOUNTER — Encounter: Payer: Self-pay | Admitting: Obstetrics and Gynecology

## 2021-03-05 ENCOUNTER — Ambulatory Visit: Payer: HMO

## 2021-03-05 ENCOUNTER — Telehealth: Payer: Self-pay

## 2021-03-05 VITALS — BP 164/89 | HR 93 | Wt 173.0 lb

## 2021-03-05 DIAGNOSIS — N3281 Overactive bladder: Secondary | ICD-10-CM

## 2021-03-05 MED ORDER — ENALAPRIL MALEATE 10 MG PO TABS: 10.0000 mg | ORAL_TABLET | Freq: Every day | ORAL | 0 refills | Status: DC

## 2021-03-05 NOTE — Progress Notes (Addendum)
[facility] Return Visit  SUBJECTIVE  History of Present Illness: [name] is a 80 y.o. female seen in follow-up for mixed incontinence, Urge predominant. Had been on Myrbetriq '50mg'$  daily and doing well until insurance raised the cost of the medication. Then she was prescribed Gemtasa '75mg'$  daily. This was also expensive so she has not been on any medication. Has glaucoma so not a candidate for other medications.  Goes through 40 diapers and pads in two weeks. Leaks large amounts.   Past Medical History: Patient  has a past medical history of Asthma, Depression, Diabetes mellitus without complication ([place]), History of surgery on arm, Hypertension, Macular degeneration, Migraine, and Urinary incontinence.   Past Surgical History: She  has a past surgical history that includes Appendectomy; Arm surgery; Breast cyst aspiration (12/20/2013); Breast biopsy (Right, 10/10/2009); Breast excisional biopsy (Left, 1998); Breast excisional biopsy (Right, 1993); and Abdominal hysterectomy.   Medications: She has a current medication list which includes the following prescription(s): acetic acid-hydrocortisone, albuterol, baclofen, benzonatate, duloxetine, enalapril, onetouch delica plus [id number], meloxicam, multivitamin with minerals, ondansetron, onetouch verio, rosuvastatin, ozempic (1 mg/dose), and triamcinolone ointment.   Allergies: Patient is allergic to amoxicillin, azithromycin, butalbital, clindamycin hcl, januvia [sitagliptin], levaquin [levofloxacin in d5w], meperidine hcl, sulfamethoxazole, buspirone, ketorolac, lidocaine, macrodantin [nitrofurantoin macrocrystal], methocarbamol, codeine phosphate, etodolac, gabapentin, naproxen, pentazocine-naloxone hcl, sumatriptan, and zonegran.   Social History: Patient  reports that she quit smoking about 29 years ago. Her smoking use included cigarettes. She started smoking about 63 years ago. She has a 66.00 pack-year smoking history.  She has never used smokeless tobacco. She reports that she does not drink alcohol and does not use drugs.      OBJECTIVE     Physical Exam: Vitals:   03/05/21 1305  BP: (!) 164/89  Pulse: 93  Weight: 173 lb (78.5 kg)   Gen: No apparent distress, A&O x 3.  Detailed Urogynecologic Evaluation:  Deferred.    ASSESSMENT AND PLAN    Ms. Rascon is a 80 y.o. with:  1. Overactive bladder    - For refractory OAB we reviewed the procedure for intravesical Botox injection with cystoscopy in the office and reviewed the risks, benefits and alternatives of treatment including but not limited to infection, need for self-catheterization and need for repeat therapy.  We discussed that there is a 5-15% chance of needing to catheterize with Botox and that this usually resolves in a few months; however can persist for longer periods of time.  Typically Botox injections would need to be repeated every 3-12 months since this is not a permanent therapy.   We discussed the role of sacral neuromodulation and how it works. It requires a test phase, and documentation of bladder function via diary. After a successful test period, a permanent wire and generator are placed in the OR. The battery lasts 5 years on average and would need to be replaced surgically.  The goal of this therapy is at least a 50% improvement in symptoms. It is NOT realistic to expect a 100% cure.   We also discussed the role of percutaneous tibial nerve stimulation and how it works.  She understands it requires 12 weekly visits for temporary neuromodulation of the sacral nerve roots via the tibial nerve and that she may then require continued tapered treatment.    - She would like to start with PTNS after she returns from [place]. In the meantime, will provide with samples of Myrbetriq '50mg'$  daily.  - She will complete a  bladder diary prior to her next appointment and starting PTNS  Jaquita Folds, MD  Time spent: I spent 25 minutes  dedicated to the care of this patient on the date of this encounter to include pre-visit review of records, face-to-face time with the patient discussing and post visit documentation.

## 2021-03-05 NOTE — Telephone Encounter (Signed)
LET PT KNOW MEDICATION IS READY FOR PICKUP AT OFFICE (IN MED ROOM FRIDGE). PT WILL HAVE SOMEONE P/U MED FOR HER.

## 2021-03-06 ENCOUNTER — Other Ambulatory Visit: Payer: Self-pay

## 2021-03-06 ENCOUNTER — Ambulatory Visit
Admission: RE | Admit: 2021-03-06 | Discharge: 2021-03-06 | Disposition: A | Discharge status: Home / Self Care | Payer: HMO | Source: Ambulatory Visit | Attending: Family Medicine | Admitting: Family Medicine

## 2021-03-06 DIAGNOSIS — Z1231 Encounter for screening mammogram for malignant neoplasm of breast: Secondary | ICD-10-CM | POA: Diagnosis not present

## 2021-03-07 ENCOUNTER — Ambulatory Visit: Payer: HMO

## 2021-03-07 ENCOUNTER — Telehealth: Payer: Self-pay

## 2021-03-07 DIAGNOSIS — M25511 Pain in right shoulder: Secondary | ICD-10-CM

## 2021-03-07 DIAGNOSIS — M62830 Muscle spasm of back: Secondary | ICD-10-CM

## 2021-03-07 DIAGNOSIS — R262 Difficulty in walking, not elsewhere classified: Secondary | ICD-10-CM | POA: Diagnosis not present

## 2021-03-07 DIAGNOSIS — R293 Abnormal posture: Secondary | ICD-10-CM

## 2021-03-07 DIAGNOSIS — G8929 Other chronic pain: Secondary | ICD-10-CM

## 2021-03-07 DIAGNOSIS — M545 Low back pain, unspecified: Secondary | ICD-10-CM

## 2021-03-07 NOTE — Telephone Encounter (Signed)
Patient calls nurse line regarding questions with weaning off of topamax. Patient reports that she has "terrible itching" every time she takes the medication.   Per last OV note, patient was to take medication, one pill per night for one week and then stop completley.  Informed patient of these instructions. Patient states that provider called back later and instructed her to take differently. Unable to find these instructions.   Patient would like to know if she can go ahead and stop medication.   Please advise.   Talbot Grumbling, RN

## 2021-03-07 NOTE — Telephone Encounter (Signed)
Yes, OK to stop now.

## 2021-03-07 NOTE — Telephone Encounter (Signed)
Called patient and informed of below.  ? ?Keidan Aumiller C Gwenetta Devos, RN ? ?

## 2021-03-07 NOTE — Patient Instructions (Signed)
Access Code: [email or web address] URL: [email or web address] Date: 03/07/2021 Prepared by: Kathreen Cornfield  Exercises Standing Shoulder Row with Anchored Resistance - 1 x daily - 7 x weekly - 2 sets - 10 reps Shoulder extension with resistance - Neutral - 1 x daily - 7 x weekly - 2 sets - 10 reps Wall Angels - 1 x daily - 7 x weekly - 1-2 sets - 10 reps

## 2021-03-07 NOTE — Therapy (Signed)
[address] [place], [place], [address] Phone: [phone]   Fax:  [phone]  Physical Therapy Treatment  Patient Details  Name: [name] MRN: [id number] Date of Birth: 1941/02/20 Referring Provider (PT): Hensel   Encounter Date: 03/07/2021   PT End of Session - 03/07/21 1157     Visit Number 3    Date for PT Re-Evaluation 05/23/21    Authorization Type Health team advantage    PT Start Time 1150    PT Stop Time 1230    PT Time Calculation (min) 40 min    Activity Tolerance Patient limited by pain    Behavior During Therapy [facility] for tasks assessed/performed             Past Medical History:  Diagnosis Date   Asthma    Depression    Diabetes mellitus without complication ([place])    History of surgery on arm    right arm ( plate and screws)   Hypertension    Macular degeneration    Migraine    without aura   Urinary incontinence     Past Surgical History:  Procedure Laterality Date   ABDOMINAL HYSTERECTOMY     ?Lt. ovary remains   APPENDECTOMY     Arm surgery     BREAST BIOPSY Right 10/10/2009   BREAST CYST ASPIRATION  12/20/2013   BREAST EXCISIONAL BIOPSY Left 1998   BREAST EXCISIONAL BIOPSY Right 1993    There were no vitals filed for this visit.   Subjective Assessment - 03/07/21 1154     Subjective Pt reports she was a little worn out after last session. She continues to have 7/10 pain, but it does feel better later in the day/next day after therapy.    Currently in Pain? Yes    Pain Score 7     Pain Location Back    Pain Orientation Mid;Upper;Right    Pain Descriptors / Indicators Sore    Pain Type Chronic pain                               [facility] Adult PT Treatment/Exercise - 03/07/21 0001       Self-Care   Self-Care Other Self-Care Comments    Other Self-Care Comments  see pt edu      Exercises   Exercises Lumbar      Lumbar Exercises: Aerobic    Nustep level 4 x 5 minutes      Lumbar Exercises: Standing   Row Both;20 reps;Theraband    Theraband Level (Row) Level 2 (Red)    Shoulder Extension Theraband;20 reps    Theraband Level (Shoulder Extension) Level 2 (Red)    Other Standing Lumbar Exercises back to wall: angels, spiderman arcs, goal post reaches    Other Standing Lumbar Exercises Open books at wall with hand to shoulder to promote thoracic rotation   cues to rotate neck with spine "look where you're going"                   PT Education - 03/07/21 1232     Education Details Edu on HEP, provided red theraband and handout    Person(s) Educated Patient    Methods Demonstration;Explanation;Tactile cues;Verbal cues;Handout    Comprehension Verbalized understanding;Returned demonstration;Verbal cues required;Tactile cues required              PT Short Term Goals - 02/20/21 1529  PT SHORT TERM GOAL #1   Title Pt will be independent with initial HEP    Time 4    Period Weeks    Status New               PT Long Term Goals - 02/20/21 1529       PT LONG TERM GOAL #1   Title Patient is independent with advanced HEP    Time 12    Period Weeks    Status New      PT LONG TERM GOAL #2   Title increase cervical ROM 25%    Time 12    Period Weeks    Status New      PT LONG TERM GOAL #3   Title increase lumbar ROM 25%    Time 12    Period Weeks    Status New      PT LONG TERM GOAL #4   Title decresae TUG to 20 seconds    Time 12    Period Weeks    Status New      PT LONG TERM GOAL #5   Title decrease pain overall 25%    Time 12    Period Weeks    Status New                   Plan - 03/07/21 1158     Clinical Impression Statement Pt presents with continued R mid upper back pain distal to R scapula, likely from old rotator cuff injury and lingering R shoulder pain that prevents full shoulder mobility. Pt tolerated tx well with no adverse reactions. Tendency to arch  back with shoulder extension and avoid R thoracic rotation. Mod verbal/tactile cueing provided to perform ther ex with good form, pt demonstrating significant improvement in R thoracic rotation at end of session with report of less pain. Educated on HEP, providing red theraband and handout.    PT Next Visit Plan Assess response to HEP/update PRN, continue with thoracic mobility, posture, back and core strength    PT Home Exercise Plan [id number]    Consulted and Agree with Plan of Care Patient             Patient will benefit from skilled therapeutic intervention in order to improve the following deficits and impairments:  Decreased coordination, Increased fascial restricitons, Decreased endurance, Increased muscle spasms, Decreased activity tolerance, Decreased strength, Difficulty walking, Decreased range of motion, Pain, Decreased balance, Impaired flexibility, Improper body mechanics, Postural dysfunction, Decreased mobility  Visit Diagnosis: Difficulty in walking, not elsewhere classified  Chronic bilateral low back pain without sciatica  Muscle spasm of back  Abnormal posture  Acute pain of right shoulder     Problem List Patient Active Problem List   Diagnosis Date Noted   Polypharmacy 12/31/2020   Asthma exacerbation 11/28/2020   Itching of ear, bilateral 05/11/2020   Financial difficulties 11/16/2019   Osteoarthritis of knee, unspecified 06/13/2019   Rotator cuff syndrome of right shoulder 01/21/2019   Bladder prolapse, female, acquired 08/16/2018   Urinary incontinence 07/05/2018   Frequent falls 12/24/2017   Adjustment reaction with anxiety and depression 0000000   Ruptured silicone breast implant 12/08/2016   Cough 03/03/2016   Iron deficiency anemia due to chronic blood loss 10/31/2015   Chronic right-sided thoracic back pain 07/26/2015   Spinal stenosis of lumbar region 07/10/2015   Abdominal aortic atherosclerosis ([place]) 07/10/2015   UTI (urinary tract  infection) 09/07/2013   Sleep apnea 05/22/2010  PERIPHERAL NEUROPATHY 02/06/2010   PULMONARY NODULE, SOLITARY 07/20/2009   Irritable bowel syndrome 11/12/2007   Non-insulin dependent type 2 diabetes mellitus ([place]) 10/08/2006   HYPERCHOLESTEROLEMIA 10/08/2006   HYPERTRIGLYCERIDEMIA 10/08/2006   Migraine headache 10/08/2006   CARPAL TUNNEL SYNDROME 10/08/2006   HYPERTENSION, BENIGN SYSTEMIC 10/08/2006   ASTHMA, PERSISTENT, MODERATE 10/08/2006   REFLUX ESOPHAGITIS 10/08/2006   Eczema 10/08/2006   Psoriasis 10/08/2006   Osteoarthritis of spine 10/08/2006   VERTIGO NOS OR DIZZINESS 10/08/2006    Izell Apalachicola, PT, DPT 03/07/2021, 12:37 PM  [address]. [place], [place], [address] Phone: [phone]   Fax:  [phone]  Name: [name] MRN: [id number] Date of Birth: 11/22/1940

## 2021-03-12 ENCOUNTER — Encounter: Payer: Self-pay | Admitting: Physical Therapy

## 2021-03-12 ENCOUNTER — Other Ambulatory Visit: Payer: Self-pay

## 2021-03-12 ENCOUNTER — Ambulatory Visit: Payer: HMO | Attending: Family Medicine | Admitting: Physical Therapy

## 2021-03-12 ENCOUNTER — Telehealth: Payer: Self-pay

## 2021-03-12 DIAGNOSIS — R293 Abnormal posture: Secondary | ICD-10-CM | POA: Insufficient documentation

## 2021-03-12 DIAGNOSIS — R262 Difficulty in walking, not elsewhere classified: Secondary | ICD-10-CM | POA: Insufficient documentation

## 2021-03-12 DIAGNOSIS — M62830 Muscle spasm of back: Secondary | ICD-10-CM | POA: Insufficient documentation

## 2021-03-12 DIAGNOSIS — M25562 Pain in left knee: Secondary | ICD-10-CM | POA: Diagnosis not present

## 2021-03-12 DIAGNOSIS — M25511 Pain in right shoulder: Secondary | ICD-10-CM | POA: Diagnosis not present

## 2021-03-12 DIAGNOSIS — M545 Low back pain, unspecified: Secondary | ICD-10-CM | POA: Diagnosis not present

## 2021-03-12 DIAGNOSIS — G8929 Other chronic pain: Secondary | ICD-10-CM | POA: Insufficient documentation

## 2021-03-12 DIAGNOSIS — M6281 Muscle weakness (generalized): Secondary | ICD-10-CM | POA: Insufficient documentation

## 2021-03-12 DIAGNOSIS — G43909 Migraine, unspecified, not intractable, without status migrainosus: Secondary | ICD-10-CM

## 2021-03-12 MED ORDER — METOPROLOL SUCCINATE ER 25 MG PO TB24: 25.0000 mg | ORAL_TABLET | Freq: Every day | ORAL | 3 refills | Status: DC

## 2021-03-12 NOTE — Telephone Encounter (Signed)
Will try beta blocker. Start low dose which her BP and HR should tolerate.

## 2021-03-12 NOTE — Assessment & Plan Note (Signed)
Will try metoprolol for prophylaxis

## 2021-03-12 NOTE — Therapy (Signed)
[address]. [place], [place], [address] Phone: [phone]   Fax:  [phone]  Physical Therapy Treatment  Patient Details  Name: [name] MRN: [id number] Date of Birth: 12/23/1940 Referring Provider (PT): Hensel   Encounter Date: 03/12/2021   PT End of Session - 03/12/21 1151     Visit Number 4    Date for PT Re-Evaluation 05/23/21    Authorization Type Health team advantage    PT Start Time 1015    PT Stop Time 1115    PT Time Calculation (min) 60 min    Activity Tolerance Patient limited by pain    Behavior During Therapy [facility] for tasks assessed/performed             Past Medical History:  Diagnosis Date   Asthma    Depression    Diabetes mellitus without complication ([place])    History of surgery on arm    right arm ( plate and screws)   Hypertension    Macular degeneration    Migraine    without aura   Urinary incontinence     Past Surgical History:  Procedure Laterality Date   ABDOMINAL HYSTERECTOMY     ?Lt. ovary remains   APPENDECTOMY     Arm surgery     BREAST BIOPSY Right 10/10/2009   BREAST CYST ASPIRATION  12/20/2013   BREAST EXCISIONAL BIOPSY Left 1998   BREAST EXCISIONAL BIOPSY Right 1993    There were no vitals filed for this visit.   Subjective Assessment - 03/12/21 1027     Subjective Reports that she was very sore after the last time reports pain an 8/10, neck upper back and low back, "maybe the bands that we did"    Currently in Pain? Yes    Pain Score 8     Pain Location Back    Aggravating Factors  using the bands                               [facility] Adult PT Treatment/Exercise - 03/12/21 0001       Lumbar Exercises: Stretches   Passive Hamstring Stretch Right;Left;4 reps;20 seconds    Piriformis Stretch Right;Left;4 reps;20 seconds    Gastroc Stretch Right;Left;2 reps;10 seconds    Gastroc Stretch Limitations seated      Lumbar  Exercises: Aerobic   Nustep level 4 x 5 minutes      Lumbar Exercises: Seated   Long Arc Quad on Chair Both;2 sets;15 reps    LAQ on Chair Weights (lbs) 2#    Other Seated Lumbar Exercises hip abduction seated 2#    Other Seated Lumbar Exercises ball roll outs 3 ways, ball b/n knees squeeze, red tband hip clamshells      Lumbar Exercises: Supine   Other Supine Lumbar Exercises feet on ball K2C, trunk rotation, small bridges and isometric abdominals      Electrical Stimulation   Electrical Stimulation Location C/T area    Electrical Stimulation Action IFC    Electrical Stimulation Parameters sitting    Electrical Stimulation Goals Pain      Manual Therapy   Manual Therapy Soft tissue mobilization    Soft tissue mobilization to the Thoracic area and into the upper traps, used vibration at times                      PT Short Term  Goals - 03/12/21 1154       PT SHORT TERM GOAL #1   Title Pt will be independent with initial HEP    Status Partially Met               PT Long Term Goals - 02/20/21 1529       PT LONG TERM GOAL #1   Title Patient is independent with advanced HEP    Time 12    Period Weeks    Status New      PT LONG TERM GOAL #2   Title increase cervical ROM 25%    Time 12    Period Weeks    Status New      PT LONG TERM GOAL #3   Title increase lumbar ROM 25%    Time 12    Period Weeks    Status New      PT LONG TERM GOAL #4   Title decresae TUG to 20 seconds    Time 12    Period Weeks    Status New      PT LONG TERM GOAL #5   Title decrease pain overall 25%    Time 12    Period Weeks    Status New                   Plan - 03/12/21 1152     Clinical Impression Statement Patient reports increased neck and back pain, reports that she feels it was from doing the theraband exercises.  I changed some of the exercies and she did well, mostly sitting type exercises that worked on good movements and ROM.  She did c/o calf  tightness and cramps.  I did the STM and she reports very tight in the neck.    PT Next Visit Plan Assess response to HEP/update PRN, continue with thoracic mobility, posture, back and core strength    Consulted and Agree with Plan of Care Patient             Patient will benefit from skilled therapeutic intervention in order to improve the following deficits and impairments:  Decreased coordination, Increased fascial restricitons, Decreased endurance, Increased muscle spasms, Decreased activity tolerance, Decreased strength, Difficulty walking, Decreased range of motion, Pain, Decreased balance, Impaired flexibility, Improper body mechanics, Postural dysfunction, Decreased mobility  Visit Diagnosis: Difficulty in walking, not elsewhere classified  Chronic bilateral low back pain without sciatica  Muscle spasm of back  Abnormal posture  Acute pain of right shoulder  Chronic pain of left knee  Muscle weakness (generalized)     Problem List Patient Active Problem List   Diagnosis Date Noted   Polypharmacy 12/31/2020   Asthma exacerbation 11/28/2020   Itching of ear, bilateral 05/11/2020   Financial difficulties 11/16/2019   Osteoarthritis of knee, unspecified 06/13/2019   Rotator cuff syndrome of right shoulder 01/21/2019   Bladder prolapse, female, acquired 08/16/2018   Urinary incontinence 07/05/2018   Frequent falls 12/24/2017   Adjustment reaction with anxiety and depression 26/71/2458   Ruptured silicone breast implant 12/08/2016   Cough 03/03/2016   Iron deficiency anemia due to chronic blood loss 10/31/2015   Chronic right-sided thoracic back pain 07/26/2015   Spinal stenosis of lumbar region 07/10/2015   Abdominal aortic atherosclerosis ([place]) 07/10/2015   UTI (urinary tract infection) 09/07/2013   Sleep apnea 05/22/2010   PERIPHERAL NEUROPATHY 02/06/2010   PULMONARY NODULE, SOLITARY 07/20/2009   Irritable bowel syndrome 11/12/2007   Non-insulin dependent  type  2 diabetes mellitus ([place]) 10/08/2006   HYPERCHOLESTEROLEMIA 10/08/2006   HYPERTRIGLYCERIDEMIA 10/08/2006   Migraine headache 10/08/2006   CARPAL TUNNEL SYNDROME 10/08/2006   HYPERTENSION, BENIGN SYSTEMIC 10/08/2006   ASTHMA, PERSISTENT, MODERATE 10/08/2006   REFLUX ESOPHAGITIS 10/08/2006   Eczema 10/08/2006   Psoriasis 10/08/2006   Osteoarthritis of spine 10/08/2006   VERTIGO NOS OR DIZZINESS 10/08/2006    Sumner Boast., PT 03/12/2021, 11:56 AM  [address] [place], [place], [address] Phone: [phone]   Fax:  [phone]  Name: [name] MRN: [id number] Date of Birth: 1940/11/01

## 2021-03-12 NOTE — Telephone Encounter (Signed)
Patient calls nurse line stating she would like to try something else for migraine prevention. Patient reports an allergy to Topamax. Patient reports she has stopped taking this all together, however has noticed migraines coming back. Please advise on alternative.

## 2021-03-14 ENCOUNTER — Ambulatory Visit: Payer: HMO

## 2021-03-15 ENCOUNTER — Telehealth: Payer: Self-pay

## 2021-03-15 NOTE — Telephone Encounter (Signed)
Patient calls nurse line regarding issues with psoriasis. Patient reports that itching is affecting her ability to sleep at night. Patient states that she was told not to take benadryl.   Patient is requesting provider recommendations for alleviation of itching.   Please advise.   Talbot Grumbling, RN

## 2021-03-18 ENCOUNTER — Encounter: Payer: Self-pay | Admitting: Physical Therapy

## 2021-03-18 ENCOUNTER — Other Ambulatory Visit: Payer: Self-pay

## 2021-03-18 ENCOUNTER — Ambulatory Visit: Payer: HMO | Admitting: Physical Therapy

## 2021-03-18 DIAGNOSIS — R262 Difficulty in walking, not elsewhere classified: Secondary | ICD-10-CM | POA: Diagnosis not present

## 2021-03-18 DIAGNOSIS — G8929 Other chronic pain: Secondary | ICD-10-CM

## 2021-03-18 DIAGNOSIS — M62830 Muscle spasm of back: Secondary | ICD-10-CM

## 2021-03-18 DIAGNOSIS — M545 Low back pain, unspecified: Secondary | ICD-10-CM

## 2021-03-18 DIAGNOSIS — M6281 Muscle weakness (generalized): Secondary | ICD-10-CM

## 2021-03-18 DIAGNOSIS — R293 Abnormal posture: Secondary | ICD-10-CM

## 2021-03-18 DIAGNOSIS — M25511 Pain in right shoulder: Secondary | ICD-10-CM

## 2021-03-18 NOTE — Therapy (Signed)
[place] [place], [place], [address] Phone: [phone]   Fax:  [phone]  Physical Therapy Treatment  Patient Details  Name: [name] MRN: [id number] Date of Birth: [birth year] Referring Provider (PT): Hensel   Encounter Date: 03/18/2021   PT End of Session - 03/18/21 1352     Visit Number 5    Date for PT Re-Evaluation 05/23/21    Authorization Type [employer] team advantage    PT Start Time 1310    PT Stop Time 1408    PT Time Calculation (min) 58 min    Activity Tolerance Patient limited by pain    Behavior During Therapy [facility] for tasks assessed/performed             Past Medical History:  Diagnosis Date   Asthma    Depression    Diabetes mellitus without complication ([place])    History of surgery on arm    right arm ( plate and screws)   Hypertension    Macular degeneration    Migraine    without aura   Urinary incontinence     Past Surgical History:  Procedure Laterality Date   ABDOMINAL HYSTERECTOMY     ?Lt. ovary remains   APPENDECTOMY     Arm surgery     BREAST BIOPSY Right 10/10/2009   BREAST CYST ASPIRATION  12/20/2013   BREAST EXCISIONAL BIOPSY Left 1998   BREAST EXCISIONAL BIOPSY Right 1993    There were no vitals filed for this visit.   Subjective Assessment - 03/18/21 1314     Subjective I got stung in the eye yesterday, my back is hurting today    Currently in Pain? Yes    Pain Score 8     Pain Location Back    Pain Orientation Mid;Upper;Right    Pain Descriptors / Indicators Aching;Sore                               [facility] Adult PT Treatment/Exercise - 03/18/21 0001       High Level Balance   High Level Balance Comments toe touches on cone,  ball toss, then on airex reaching, head turns and ball toss, tried cone touches with the airex, very difficult fo rher, tried ball toss and cone touches on the airex, very difficult for her      Lumbar  Exercises: Stretches   Passive Hamstring Stretch Right;Left;4 reps;20 seconds    Lower Trunk Rotation 3 reps;10 seconds    Piriformis Stretch Right;Left;4 reps;20 seconds    Gastroc Stretch Right;Left;2 reps;10 seconds      Lumbar Exercises: Aerobic   Nustep level 4 x 5 minutes      Lumbar Exercises: Standing   Other Standing Lumbar Exercises back to wall: angels, spiderman arcs, goal post reaches      Lumbar Exercises: Supine   Bridge 10 reps    Other Supine Lumbar Exercises feet on ball K2C, trunk rotation, small bridges and isometric abdominals      Electrical Stimulation   Electrical Stimulation Location right scapular area    Electrical Stimulation Action IFC    Electrical Stimulation Parameters sitting    Electrical Stimulation Goals Pain      Manual Therapy   Manual Therapy Soft tissue mobilization    Soft tissue mobilization to the Thoracic area and into the upper traps, used vibration at times  PT Short Term Goals - 03/12/21 1154       PT SHORT TERM GOAL #1   Title Pt will be independent with initial HEP    Status Partially Met               PT Long Term Goals - 03/18/21 1354       PT LONG TERM GOAL #1   Title Patient is independent with advanced HEP    Status On-going      PT LONG TERM GOAL #2   Title increase cervical ROM 25%    Status On-going                   Plan - 03/18/21 1353     Clinical Impression Statement Patient reports worries about her balance, she does report a fall in January, we worked on balance some, had difficulty on the airex, I also did some direction changes and this seemed to make her dizzy, still with significant mid to upper back pain             Patient will benefit from skilled therapeutic intervention in order to improve the following deficits and impairments:  Decreased coordination, Increased fascial restricitons, Decreased endurance, Increased muscle spasms, Decreased  activity tolerance, Decreased strength, Difficulty walking, Decreased range of motion, Pain, Decreased balance, Impaired flexibility, Improper body mechanics, Postural dysfunction, Decreased mobility  Visit Diagnosis: Difficulty in walking, not elsewhere classified  Chronic bilateral low back pain without sciatica  Muscle spasm of back  Abnormal posture  Acute pain of right shoulder  Muscle weakness (generalized)     Problem List Patient Active Problem List   Diagnosis Date Noted   Polypharmacy 12/31/2020   Asthma exacerbation 11/28/2020   Itching of ear, bilateral 05/11/2020   Financial difficulties 11/16/2019   Osteoarthritis of knee, unspecified 06/13/2019   Rotator cuff syndrome of right shoulder 01/21/2019   Bladder prolapse, female, acquired 08/16/2018   Urinary incontinence 07/05/2018   Frequent falls 12/24/2017   Adjustment reaction with anxiety and depression 07/62/2633   Ruptured silicone breast implant 12/08/2016   Cough 03/03/2016   Iron deficiency anemia due to chronic blood loss 10/31/2015   Chronic right-sided thoracic back pain 07/26/2015   Spinal stenosis of lumbar region 07/10/2015   Abdominal aortic atherosclerosis ([place]) 07/10/2015   UTI (urinary tract infection) 09/07/2013   Sleep apnea 05/22/2010   PERIPHERAL NEUROPATHY 02/06/2010   PULMONARY NODULE, SOLITARY 07/20/2009   Irritable bowel syndrome 11/12/2007   Non-insulin dependent type 2 diabetes mellitus ([place]) 10/08/2006   HYPERCHOLESTEROLEMIA 10/08/2006   HYPERTRIGLYCERIDEMIA 10/08/2006   Migraine headache 10/08/2006   CARPAL TUNNEL SYNDROME 10/08/2006   HYPERTENSION, BENIGN SYSTEMIC 10/08/2006   ASTHMA, PERSISTENT, MODERATE 10/08/2006   REFLUX ESOPHAGITIS 10/08/2006   Eczema 10/08/2006   Psoriasis 10/08/2006   Osteoarthritis of spine 10/08/2006   VERTIGO NOS OR DIZZINESS 10/08/2006    Sumner Boast., PT 03/18/2021, 1:55 PM  [address]. [place], [place], [address] Phone: [phone]   Fax:  [phone]  Name: Taquanna Borras MRN: [id number] Date of Birth: [birth year]

## 2021-03-18 NOTE — Telephone Encounter (Signed)
Called.  Recommended allegra or zyrtec

## 2021-03-20 ENCOUNTER — Other Ambulatory Visit: Payer: Self-pay

## 2021-03-20 ENCOUNTER — Ambulatory Visit: Payer: HMO | Admitting: Physical Therapy

## 2021-03-20 ENCOUNTER — Encounter: Payer: Self-pay | Admitting: Physical Therapy

## 2021-03-20 DIAGNOSIS — M62830 Muscle spasm of back: Secondary | ICD-10-CM

## 2021-03-20 DIAGNOSIS — R262 Difficulty in walking, not elsewhere classified: Secondary | ICD-10-CM | POA: Diagnosis not present

## 2021-03-20 DIAGNOSIS — M545 Low back pain, unspecified: Secondary | ICD-10-CM

## 2021-03-20 DIAGNOSIS — M25511 Pain in right shoulder: Secondary | ICD-10-CM

## 2021-03-20 DIAGNOSIS — M6281 Muscle weakness (generalized): Secondary | ICD-10-CM

## 2021-03-20 DIAGNOSIS — G8929 Other chronic pain: Secondary | ICD-10-CM

## 2021-03-20 DIAGNOSIS — R293 Abnormal posture: Secondary | ICD-10-CM

## 2021-03-20 NOTE — Therapy (Signed)
[place]. [place], [place], [address] Phone: [phone]   Fax:  [phone]  Physical Therapy Treatment  Patient Details  Name: [name] MRN: [id number] Date of Birth: Jan 08, 1941 Referring Provider (PT): Hensel   Encounter Date: 03/20/2021   PT End of Session - 03/20/21 1351     Visit Number 6    Date for PT Re-Evaluation 05/23/21    Authorization Type [employer] team advantage    PT Start Time 1311    PT Stop Time 1410    PT Time Calculation (min) 59 min    Activity Tolerance Patient tolerated treatment well    Behavior During Therapy [facility] for tasks assessed/performed             Past Medical History:  Diagnosis Date   Asthma    Depression    Diabetes mellitus without complication ([place])    History of surgery on arm    right arm ( plate and screws)   Hypertension    Macular degeneration    Migraine    without aura   Urinary incontinence     Past Surgical History:  Procedure Laterality Date   ABDOMINAL HYSTERECTOMY     ?Lt. ovary remains   APPENDECTOMY     Arm surgery     BREAST BIOPSY Right 10/10/2009   BREAST CYST ASPIRATION  12/20/2013   BREAST EXCISIONAL BIOPSY Left 1998   BREAST EXCISIONAL BIOPSY Right 1993    There were no vitals filed for this visit.   Subjective Assessment - 03/20/21 1315     Subjective I feel a little better still hurting    Currently in Pain? Yes    Pain Score 7     Pain Location Back    Pain Orientation Mid;Upper    Aggravating Factors  the treatment                               [facility] Adult PT Treatment/Exercise - 03/20/21 0001       High Level Balance   High Level Balance Activities Negotitating around obstacles;Negotiating over obstacles;Head turns    High Level Balance Comments toe touches on cone,  ball toss, then on airex reaching, head turns and ball toss, tried cone touches with the airex, very difficult fo rher, tried  ball toss and cone touches on the airex, very difficult for her      Electrical Stimulation   Electrical Stimulation Location right scapular area    Electrical Stimulation Action IFC    Electrical Stimulation Parameters sitting    Electrical Stimulation Goals Pain      Manual Therapy   Manual Therapy Soft tissue mobilization    Soft tissue mobilization to the Thoracic area and into the upper traps, used vibration at times                      PT Short Term Goals - 03/12/21 1154       PT SHORT TERM GOAL #1   Title Pt will be independent with initial HEP    Status Partially Met               PT Long Term Goals - 03/20/21 1353       PT LONG TERM GOAL #1   Title Patient is independent with advanced HEP    Status Achieved  Plan - 03/20/21 1352     Clinical Impression Statement I added more balance she really has diffiuclty turning right, she tends to lose her blaance with this.  She is very tight in the shoulders and neck area.  She seems to be more motivated this round of PT    PT Next Visit Plan continue to add exercises, balance    Consulted and Agree with Plan of Care Patient             Patient will benefit from skilled therapeutic intervention in order to improve the following deficits and impairments:  Decreased coordination, Increased fascial restricitons, Decreased endurance, Increased muscle spasms, Decreased activity tolerance, Decreased strength, Difficulty walking, Decreased range of motion, Pain, Decreased balance, Impaired flexibility, Improper body mechanics, Postural dysfunction, Decreased mobility  Visit Diagnosis: Difficulty in walking, not elsewhere classified  Chronic bilateral low back pain without sciatica  Muscle spasm of back  Abnormal posture  Acute pain of right shoulder  Muscle weakness (generalized)     Problem List Patient Active Problem List   Diagnosis Date Noted   Polypharmacy  12/31/2020   Asthma exacerbation 11/28/2020   Itching of ear, bilateral 05/11/2020   Financial difficulties 11/16/2019   Osteoarthritis of knee, unspecified 06/13/2019   Rotator cuff syndrome of right shoulder 01/21/2019   Bladder prolapse, female, acquired 08/16/2018   Urinary incontinence 07/05/2018   Frequent falls 12/24/2017   Adjustment reaction with anxiety and depression 89/37/3428   Ruptured silicone breast implant 12/08/2016   Cough 03/03/2016   Iron deficiency anemia due to chronic blood loss 10/31/2015   Chronic right-sided thoracic back pain 07/26/2015   Spinal stenosis of lumbar region 07/10/2015   Abdominal aortic atherosclerosis ([place]) 07/10/2015   UTI (urinary tract infection) 09/07/2013   Sleep apnea 05/22/2010   PERIPHERAL NEUROPATHY 02/06/2010   PULMONARY NODULE, SOLITARY 07/20/2009   Irritable bowel syndrome 11/12/2007   Non-insulin dependent type 2 diabetes mellitus ([place]) 10/08/2006   HYPERCHOLESTEROLEMIA 10/08/2006   HYPERTRIGLYCERIDEMIA 10/08/2006   Migraine headache 10/08/2006   CARPAL TUNNEL SYNDROME 10/08/2006   HYPERTENSION, BENIGN SYSTEMIC 10/08/2006   ASTHMA, PERSISTENT, MODERATE 10/08/2006   REFLUX ESOPHAGITIS 10/08/2006   Eczema 10/08/2006   Psoriasis 10/08/2006   Osteoarthritis of spine 10/08/2006   VERTIGO NOS OR DIZZINESS 10/08/2006    Sumner Boast., PT 03/20/2021, 1:54 PM  [place] [place], [place], [address] Phone: [phone]   Fax:  [phone]  Name: [name] MRN: [id number] Date of Birth: 10-Feb-1941

## 2021-03-25 ENCOUNTER — Ambulatory Visit: Payer: HMO | Admitting: Physical Therapy

## 2021-03-25 ENCOUNTER — Other Ambulatory Visit: Payer: Self-pay

## 2021-03-25 ENCOUNTER — Encounter: Payer: Self-pay | Admitting: Physical Therapy

## 2021-03-25 DIAGNOSIS — R262 Difficulty in walking, not elsewhere classified: Secondary | ICD-10-CM | POA: Diagnosis not present

## 2021-03-25 DIAGNOSIS — M25511 Pain in right shoulder: Secondary | ICD-10-CM

## 2021-03-25 DIAGNOSIS — G8929 Other chronic pain: Secondary | ICD-10-CM

## 2021-03-25 DIAGNOSIS — R293 Abnormal posture: Secondary | ICD-10-CM

## 2021-03-25 DIAGNOSIS — M6281 Muscle weakness (generalized): Secondary | ICD-10-CM

## 2021-03-25 DIAGNOSIS — M62830 Muscle spasm of back: Secondary | ICD-10-CM

## 2021-03-25 NOTE — Therapy (Signed)
[place]. [place], [place], [address] Phone: [phone]   Fax:  [phone]  Physical Therapy Treatment  Patient Details  Name: [name] MRN: [id number] Date of Birth: 1940/09/26 Referring Provider (PT): Hensel   Encounter Date: 03/25/2021   PT End of Session - 03/25/21 3845     Visit Number 7    Date for PT Re-Evaluation 05/23/21    Authorization Type Health team advantage    PT Start Time 3646    PT Stop Time 1404    PT Time Calculation (min) 56 min    Activity Tolerance Patient tolerated treatment well    Behavior During Therapy [facility] for tasks assessed/performed             Past Medical History:  Diagnosis Date   Asthma    Depression    Diabetes mellitus without complication ([place])    History of surgery on arm    right arm ( plate and screws)   Hypertension    Macular degeneration    Migraine    without aura   Urinary incontinence     Past Surgical History:  Procedure Laterality Date   ABDOMINAL HYSTERECTOMY     ?Lt. ovary remains   APPENDECTOMY     Arm surgery     BREAST BIOPSY Right 10/10/2009   BREAST CYST ASPIRATION  12/20/2013   BREAST EXCISIONAL BIOPSY Left 1998   BREAST EXCISIONAL BIOPSY Right 1993    There were no vitals filed for this visit.   Subjective Assessment - 03/25/21 1310     Subjective I think the back was a little better, still off balance at times    Currently in Pain? Yes    Pain Score 5     Pain Location Back    Pain Relieving Factors the treatment seems to help                               [facility] Adult PT Treatment/Exercise - 03/25/21 0001       High Level Balance   High Level Balance Activities Negotitating around obstacles;Negotiating over obstacles;Head turns    High Level Balance Comments toe touches on cone,  ball toss, then on airex reaching, head turns and ball toss, tried cone touches with the airex, very difficult fo  rher, tried ball toss and cone touches on the airex, very difficult for her      Lumbar Exercises: Stretches   Passive Hamstring Stretch Right;Left;4 reps;20 seconds    Piriformis Stretch Right;Left;4 reps;20 seconds    Gastroc Stretch Right;Left;2 reps;10 seconds      Lumbar Exercises: Supine   Other Supine Lumbar Exercises feet on ball K2C, trunk rotation, small bridges and isometric abdominals      Electrical Stimulation   Electrical Stimulation Location right scapular area    Electrical Stimulation Action IFC    Electrical Stimulation Parameters sitting pa      Manual Therapy   Manual Therapy Soft tissue mobilization    Manual therapy comments KT tape for spasms RT thor                      PT Short Term Goals - 03/12/21 1154       PT SHORT TERM GOAL #1   Title Pt will be independent with initial HEP    Status Partially Met  PT Long Term Goals - 03/20/21 1353       PT LONG TERM GOAL #1   Title Patient is independent with advanced HEP    Status Achieved                   Plan - 03/25/21 1348     Clinical Impression Statement Patient is reporting feeling a little better, still tender and tight in the upper traps and the rhomboids, she has difficulty with the high level balance especially dynamic surfaces.  She will be on a trip next week, may need to assure what she can do for her pain on the trip and assure safety with balance    PT Next Visit Plan continue to add exercises, balance    Consulted and Agree with Plan of Care Patient             Patient will benefit from skilled therapeutic intervention in order to improve the following deficits and impairments:  Decreased coordination, Increased fascial restricitons, Decreased endurance, Increased muscle spasms, Decreased activity tolerance, Decreased strength, Difficulty walking, Decreased range of motion, Pain, Decreased balance, Impaired flexibility, Improper body  mechanics, Postural dysfunction, Decreased mobility  Visit Diagnosis: Difficulty in walking, not elsewhere classified  Chronic bilateral low back pain without sciatica  Muscle spasm of back  Abnormal posture  Acute pain of right shoulder  Muscle weakness (generalized)     Problem List Patient Active Problem List   Diagnosis Date Noted   Polypharmacy 12/31/2020   Asthma exacerbation 11/28/2020   Itching of ear, bilateral 05/11/2020   Financial difficulties 11/16/2019   Osteoarthritis of knee, unspecified 06/13/2019   Rotator cuff syndrome of right shoulder 01/21/2019   Bladder prolapse, female, acquired 08/16/2018   Urinary incontinence 07/05/2018   Frequent falls 12/24/2017   Adjustment reaction with anxiety and depression 15/12/6977   Ruptured silicone breast implant 12/08/2016   Cough 03/03/2016   Iron deficiency anemia due to chronic blood loss 10/31/2015   Chronic right-sided thoracic back pain 07/26/2015   Spinal stenosis of lumbar region 07/10/2015   Abdominal aortic atherosclerosis ([place]) 07/10/2015   UTI (urinary tract infection) 09/07/2013   Sleep apnea 05/22/2010   PERIPHERAL NEUROPATHY 02/06/2010   PULMONARY NODULE, SOLITARY 07/20/2009   Irritable bowel syndrome 11/12/2007   Non-insulin dependent type 2 diabetes mellitus ([place]) 10/08/2006   HYPERCHOLESTEROLEMIA 10/08/2006   HYPERTRIGLYCERIDEMIA 10/08/2006   Migraine headache 10/08/2006   CARPAL TUNNEL SYNDROME 10/08/2006   HYPERTENSION, BENIGN SYSTEMIC 10/08/2006   ASTHMA, PERSISTENT, MODERATE 10/08/2006   REFLUX ESOPHAGITIS 10/08/2006   Eczema 10/08/2006   Psoriasis 10/08/2006   Osteoarthritis of spine 10/08/2006   VERTIGO NOS OR DIZZINESS 10/08/2006    Sumner Boast., PT 03/25/2021, 1:50 PM  [place]. [place], [place], [address] Phone: [phone]   Fax:  [phone]  Name: [name] MRN: [id number] Date of Birth:  1940-08-14

## 2021-03-27 ENCOUNTER — Other Ambulatory Visit: Payer: Self-pay

## 2021-03-27 ENCOUNTER — Ambulatory Visit: Payer: HMO | Admitting: Physical Therapy

## 2021-03-27 ENCOUNTER — Encounter: Payer: Self-pay | Admitting: Physical Therapy

## 2021-03-27 DIAGNOSIS — R262 Difficulty in walking, not elsewhere classified: Secondary | ICD-10-CM | POA: Diagnosis not present

## 2021-03-27 DIAGNOSIS — M62830 Muscle spasm of back: Secondary | ICD-10-CM

## 2021-03-27 DIAGNOSIS — R293 Abnormal posture: Secondary | ICD-10-CM

## 2021-03-27 DIAGNOSIS — M6281 Muscle weakness (generalized): Secondary | ICD-10-CM

## 2021-03-27 DIAGNOSIS — M25511 Pain in right shoulder: Secondary | ICD-10-CM

## 2021-03-27 DIAGNOSIS — M545 Low back pain, unspecified: Secondary | ICD-10-CM

## 2021-03-27 NOTE — Therapy (Signed)
[place]. [place], [place], [address] Phone: [phone]   Fax:  [phone]  Physical Therapy Treatment  Patient Details  Name: [name] MRN: [id number] Date of Birth: 20-Mar-1941 Referring Provider (PT): Hensel   Encounter Date: 03/27/2021   PT End of Session - 03/27/21 1339     Visit Number 8    Date for PT Re-Evaluation 05/23/21    Authorization Type Health team advantage    PT Start Time 9833    PT Stop Time 8250    PT Time Calculation (min) 47 min    Activity Tolerance Patient tolerated treatment well    Behavior During Therapy [facility] for tasks assessed/performed             Past Medical History:  Diagnosis Date   Asthma    Depression    Diabetes mellitus without complication ([place])    History of surgery on arm    right arm ( plate and screws)   Hypertension    Macular degeneration    Migraine    without aura   Urinary incontinence     Past Surgical History:  Procedure Laterality Date   ABDOMINAL HYSTERECTOMY     ?Lt. ovary remains   APPENDECTOMY     Arm surgery     BREAST BIOPSY Right 10/10/2009   BREAST CYST ASPIRATION  12/20/2013   BREAST EXCISIONAL BIOPSY Left 1998   BREAST EXCISIONAL BIOPSY Right 1993    There were no vitals filed for this visit.   Subjective Assessment - 03/27/21 1311     Subjective I will be going on a trip in the next few days, I have to go home an pack.    Currently in Pain? Yes    Pain Score 5     Pain Location Back    Pain Orientation Right;Upper    Pain Descriptors / Indicators Aching;Sore    Aggravating Factors  some stress                               [facility] Adult PT Treatment/Exercise - 03/27/21 0001       Lumbar Exercises: Stretches   Passive Hamstring Stretch Right;Left;4 reps;20 seconds    Lower Trunk Rotation 3 reps;10 seconds    Piriformis Stretch Right;Left;4 reps;20 seconds    Gastroc Stretch Right;Left;3  reps;10 seconds      Lumbar Exercises: Aerobic   Nustep level 4 x 5 minutes      Lumbar Exercises: Seated   Other Seated Lumbar Exercises shoulder ER in sitting      Lumbar Exercises: Supine   Pelvic Tilt 10 reps;5 seconds    Clam 20 reps;1 second    Clam Limitations red tband    Bridge 10 reps    Other Supine Lumbar Exercises feet on ball K2C, trunk rotation, small bridges and isometric abdominals      Electrical Stimulation   Electrical Stimulation Location right scapular area    Electrical Stimulation Action IFC    Electrical Stimulation Parameters sitting    Electrical Stimulation Goals Pain      Manual Therapy   Manual Therapy Soft tissue mobilization    Soft tissue mobilization to the Thoracic area and into the upper traps, used vibration at times                      PT Short Term Goals - 03/12/21  [place] #1   Title Pt will be independent with initial HEP    Status Partially Met               PT Long Term Goals - 03/27/21 1342       PT LONG TERM GOAL #2   Title increase cervical ROM 25%    Status On-going      PT LONG TERM GOAL #3   Title increase lumbar ROM 25%    Status On-going      PT LONG TERM GOAL #4   Title decresae TUG to 20 seconds    Status On-going                   Plan - 03/27/21 1340     Clinical Impression Statement Patient will be leaving on a vacation in the next day or so, she reports needing to pack and does not want to do anything new that may ruin her trip, we did some basic exercises but I did add some basic stuff to help with posture and function.  She needed cues for this but no adverse affects, she is very tight in the calves and piriformis mms    PT Next Visit Plan will see how her vacation went and progress accordingly    Consulted and Agree with Plan of Care Patient             Patient will benefit from skilled therapeutic intervention in order to improve the following  deficits and impairments:  Decreased coordination, Increased fascial restricitons, Decreased endurance, Increased muscle spasms, Decreased activity tolerance, Decreased strength, Difficulty walking, Decreased range of motion, Pain, Decreased balance, Impaired flexibility, Improper body mechanics, Postural dysfunction, Decreased mobility  Visit Diagnosis: Difficulty in walking, not elsewhere classified  Chronic bilateral low back pain without sciatica  Muscle spasm of back  Abnormal posture  Acute pain of right shoulder  Muscle weakness (generalized)     Problem List Patient Active Problem List   Diagnosis Date Noted   Polypharmacy 12/31/2020   Asthma exacerbation 11/28/2020   Itching of ear, bilateral 05/11/2020   Financial difficulties 11/16/2019   Osteoarthritis of knee, unspecified 06/13/2019   Rotator cuff syndrome of right shoulder 01/21/2019   Bladder prolapse, female, acquired 08/16/2018   Urinary incontinence 07/05/2018   Frequent falls 12/24/2017   Adjustment reaction with anxiety and depression 45/62/5638   Ruptured silicone breast implant 12/08/2016   Cough 03/03/2016   Iron deficiency anemia due to chronic blood loss 10/31/2015   Chronic right-sided thoracic back pain 07/26/2015   Spinal stenosis of lumbar region 07/10/2015   Abdominal aortic atherosclerosis ([place]) 07/10/2015   UTI (urinary tract infection) 09/07/2013   Sleep apnea 05/22/2010   PERIPHERAL NEUROPATHY 02/06/2010   PULMONARY NODULE, SOLITARY 07/20/2009   Irritable bowel syndrome 11/12/2007   Non-insulin dependent type 2 diabetes mellitus ([place]) 10/08/2006   HYPERCHOLESTEROLEMIA 10/08/2006   HYPERTRIGLYCERIDEMIA 10/08/2006   Migraine headache 10/08/2006   CARPAL TUNNEL SYNDROME 10/08/2006   HYPERTENSION, BENIGN SYSTEMIC 10/08/2006   ASTHMA, PERSISTENT, MODERATE 10/08/2006   REFLUX ESOPHAGITIS 10/08/2006   Eczema 10/08/2006   Psoriasis 10/08/2006   Osteoarthritis of spine 10/08/2006   VERTIGO  NOS OR DIZZINESS 10/08/2006    Sumner Boast., PT 03/27/2021, 1:43 PM  [place]. [place], [place], [address] Phone: [phone]   Fax:  [phone]  Name: [name] MRN: [id number] Date of  Birth: March 08, 1941

## 2021-04-01 ENCOUNTER — Ambulatory Visit: Payer: HMO | Admitting: Physical Therapy

## 2021-04-02 ENCOUNTER — Telehealth: Payer: Self-pay

## 2021-04-02 NOTE — Telephone Encounter (Signed)
[name] complains of extremely bad pain in her back. Pain is so bad she can not sit, lay flat or walk. Per patient she is taking all of her medication and nothing is helping.  Patient stated she fell over the weekend, but did not seek medical attention. If she could she would like to speak to you. Call back phone [phone].

## 2021-04-03 ENCOUNTER — Encounter: Payer: HMO | Admitting: Physical Therapy

## 2021-04-03 NOTE — Telephone Encounter (Signed)
Patient on tomorrow's schedule to see Dr. Ranell Patrick.

## 2021-04-03 NOTE — Telephone Encounter (Signed)
Please see task on [employer]. Please call her back for Trigger Points ASAP. Today if possible.

## 2021-04-04 ENCOUNTER — Ambulatory Visit (INDEPENDENT_AMBULATORY_CARE_PROVIDER_SITE_OTHER): Payer: HMO | Admitting: Family Medicine

## 2021-04-04 ENCOUNTER — Other Ambulatory Visit: Payer: Self-pay

## 2021-04-04 ENCOUNTER — Encounter: Payer: HMO | Attending: Physical Medicine and Rehabilitation | Admitting: Physical Medicine and Rehabilitation

## 2021-04-04 VITALS — BP 155/113 | HR 102 | Temp 97.5°F | Ht <= 58 in

## 2021-04-04 VITALS — BP 167/84 | HR 101 | Temp 98.7°F | Ht <= 58 in | Wt 175.4 lb

## 2021-04-04 DIAGNOSIS — Z13820 Encounter for screening for osteoporosis: Secondary | ICD-10-CM | POA: Diagnosis not present

## 2021-04-04 DIAGNOSIS — E119 Type 2 diabetes mellitus without complications: Secondary | ICD-10-CM | POA: Diagnosis not present

## 2021-04-04 DIAGNOSIS — M546 Pain in thoracic spine: Secondary | ICD-10-CM | POA: Diagnosis not present

## 2021-04-04 DIAGNOSIS — M7918 Myalgia, other site: Secondary | ICD-10-CM

## 2021-04-04 DIAGNOSIS — R059 Cough, unspecified: Secondary | ICD-10-CM

## 2021-04-04 DIAGNOSIS — G8929 Other chronic pain: Secondary | ICD-10-CM

## 2021-04-04 LAB — POCT GLYCOSYLATED HEMOGLOBIN (HGB A1C): HbA1c, POC (controlled diabetic range): 6.3 % (ref 0.0–7.0)

## 2021-04-04 MED ORDER — GUAIFENESIN-DM 100-10 MG/5ML PO SYRP: 5.0000 mL | ORAL_SOLUTION | ORAL | 0 refills | Status: DC | PRN

## 2021-04-04 MED ORDER — HYDROCODONE-ACETAMINOPHEN 5-325 MG PO TABS: 1.0000 | ORAL_TABLET | Freq: Four times a day (QID) | ORAL | 0 refills | Status: DC | PRN

## 2021-04-04 MED ORDER — BENZONATATE 200 MG PO CAPS: 200.0000 mg | ORAL_CAPSULE | Freq: Two times a day (BID) | ORAL | 3 refills | Status: DC | PRN

## 2021-04-04 NOTE — Patient Instructions (Signed)
-  Discussed current symptoms of pain and history of pain.  -Discussed benefits of exercise in reducing pain. -Discussed following foods that may reduce pain: 1) Ginger (especially studied for arthritis)- reduce leukotriene production to decrease inflammation 2) Blueberries- high in phytonutrients that decrease inflammation 3) Salmon- marine omega-3s reduce joint swelling and pain 4) Pumpkin seeds- reduce inflammation 5) dark chocolate- reduces inflammation 6) turmeric- reduces inflammation 7) tart cherries - reduce pain and stiffness 8) extra virgin olive oil - its compound olecanthal helps to block prostaglandins  9) chili peppers- can be eaten or applied topically via capsaicin 10) mint- helpful for headache, muscle aches, joint pain, and itching 11) garlic- reduces inflammation  Link to further information on diet for chronic pain: [email or web address]

## 2021-04-04 NOTE — Patient Instructions (Addendum)
It was a pleasure to see you today!  Take norco 5-325 mg once every 6 hours as needed for back pain Go to [facility] tomorrow to [place] and get an x-ray of your chest and back to look for pneumonia or a rib/vertebral fracture. If you have a fever (temp over 101.5*F) please call us for follow up. If you are not feeling better by Monday, call our office to schedule a follow up [facility] to schedule a bone density scan at your earliest convenience  Be Well,  Dr. Chauncey Reading

## 2021-04-04 NOTE — Progress Notes (Signed)
    SUBJECTIVE:   CHIEF COMPLAINT / HPI: back pain  Right sided-thoracic back pain: Patient has a long history of back pain, but is having an acute flare that started 1 week ago on Thursday. Patient has had constant sharp pain that has prevented her from sleeping since then. She saw PM&R earlier today and received a trigger point injection. She is still experiencing severe pain and discomfort, reports that the last time she had a flare, she received relief with a few doses of oxycodone. She already takes baclofen and duloxetine with no relief. She also reports that she had a fall over the weekend and got a bruise on her knee (fell forward and landed on hands and knees). She fell by tripping over an object on the ground, no dizziness, no LOC, no weakness or problems ambulating, just right back severe pain.  Cough: patient reports that she has had a cough for 2 weeks. No fever, no runny nose, no CP, wheezing or SOB. She reports no sputum production, just a dry hacking cough. She has remote history of smoking (<10 ppy, quit in the early 1990s).   PERTINENT  PMH / PSH: DM2, chronic right-sided thoracic back pain  OBJECTIVE:   BP (!) 155/113   Pulse (!) 102   Temp (!) 97.5 F (36.4 C)   Ht '4\' 10"'$  (123XX123 m)   SpO2 99%   BMI 36.66 kg/m   Nursing note and vitals reviewed GEN: elderly WW, very uncomfortable, unable to sit still, leaning onto counter, mild distress, WNWD HEENT: NCAT. PERRLA. Sclera without injection or icterus. MMM. Clear oropharynx, sinus passages without erythema/edema Neck: Supple. No LAD. Cardiac: Regular rate and rhythm. Normal S1/S2. No murmurs, rubs, or gallops appreciated. 2+ radial pulses. Lungs: Crackles present in right base. Good air movement in all lung fields, no wheezing. No increased WOB, no accessory muscle usage. Back: Patient unable to sit, exquisitely tender to palpation on right back approx T7-T8. No ecchymosis or rash. Sensation and strength testing intact  bilaterally. Ext: no edema Psych: Pleasant and appropriate given significant pain  ASSESSMENT/PLAN:   No problem-specific Assessment & Plan notes found for this encounter.     Debra Damme, MD [place]

## 2021-04-04 NOTE — Progress Notes (Signed)
Trigger Point Injection  Indication: Thoracic myofascial pain not relieved by medication management and other conservative care.  Informed consent was obtained after describing risk and benefits of the procedure with the patient, this includes bleeding, bruising, infection and medication side effects.  The patient wishes to proceed and has given written consent.  The patient was placed in a seated position.  The cervical area was marked and prepped with Betadine.  It was entered with a 25-gauge 1/2 inch needle and a total of 5 mL of 1% lidocaine was injected into a total of 4 trigger points, after negative draw back for blood.  The patient tolerated the procedure well.  Post procedure instructions were given.

## 2021-04-05 ENCOUNTER — Telehealth: Payer: Self-pay

## 2021-04-05 ENCOUNTER — Ambulatory Visit
Admission: RE | Admit: 2021-04-05 | Discharge: 2021-04-05 | Disposition: A | Discharge status: Home / Self Care | Payer: HMO | Source: Ambulatory Visit | Attending: Family Medicine | Admitting: Family Medicine

## 2021-04-05 ENCOUNTER — Other Ambulatory Visit: Payer: Self-pay | Admitting: Family Medicine

## 2021-04-05 DIAGNOSIS — R918 Other nonspecific abnormal finding of lung field: Secondary | ICD-10-CM | POA: Diagnosis not present

## 2021-04-05 DIAGNOSIS — J189 Pneumonia, unspecified organism: Secondary | ICD-10-CM

## 2021-04-05 DIAGNOSIS — M546 Pain in thoracic spine: Secondary | ICD-10-CM

## 2021-04-05 DIAGNOSIS — R059 Cough, unspecified: Secondary | ICD-10-CM | POA: Diagnosis not present

## 2021-04-05 DIAGNOSIS — M419 Scoliosis, unspecified: Secondary | ICD-10-CM | POA: Diagnosis not present

## 2021-04-05 DIAGNOSIS — M47814 Spondylosis without myelopathy or radiculopathy, thoracic region: Secondary | ICD-10-CM | POA: Diagnosis not present

## 2021-04-05 MED ORDER — DOXYCYCLINE HYCLATE 100 MG PO TABS: 100.0000 mg | ORAL_TABLET | Freq: Two times a day (BID) | ORAL | 0 refills | Status: DC

## 2021-04-05 MED ORDER — CEFDINIR 300 MG PO CAPS: 300.0000 mg | ORAL_CAPSULE | Freq: Two times a day (BID) | ORAL | 0 refills | Status: DC

## 2021-04-05 NOTE — Assessment & Plan Note (Signed)
Flare of severe pain that started 1 week ago, likely exacerbated by fall this weekend, but not caused by this as the pain predated the fall. Most likely musculoligamentous in nature, however, given cough started 2 weeks before and pt has h/o osteopenia (last DEXA 2013), will get spine x-ray to r/o fx. Discussed with Dr. Nori Riis, will give short tx with norvo 5-'325mg'$  q6h prn x4d. If patient has no improvement by Monday, return for follow up.

## 2021-04-05 NOTE — Telephone Encounter (Signed)
Patient calls nurse line requesting to speak with provider regarding recent imaging results. Patient also reports that pain has only minimally improved with the hydrocodone-acetaminophen.   Please advise.   Talbot Grumbling, RN

## 2021-04-05 NOTE — Telephone Encounter (Signed)
Called to discuss CXR results with patient, which show no rib fractures, but do show new airspace opacity at lung base compatible with developing PNA. Patient is still having significant back pain despite norco, however, unable to increase dose due to concerns of falls and decreased breathing, age, etc. Patient has mild allergies to amoxicillin and azithromycin (itching/rash). CURB65 1 only for age. Will tx outpatient with doxycycline and 3rd gen cephalosporin for 10 days. Recommend yogurt for GI upset. Discussed return precautions, low threshold to go to ED. Patient is in agreement with treatment plan.  Gladys Damme, MD [place] [facility], PGY-3

## 2021-04-05 NOTE — Assessment & Plan Note (Signed)
Patient has cough x2 weeks with crackles at base, no wheezing, but appears well on exam, VSS, no fever. Do not suspect PNA, could be indolent bronchitis. Patient has DM2 and just received trigger point injection today, cannot treat with oral steroids at this time. Will obtain CXR and follow up. Patient reports some improvement with tessalon perles.

## 2021-04-08 ENCOUNTER — Telehealth: Payer: Self-pay

## 2021-04-08 ENCOUNTER — Encounter: Payer: HMO | Admitting: Physical Therapy

## 2021-04-08 DIAGNOSIS — M546 Pain in thoracic spine: Secondary | ICD-10-CM

## 2021-04-08 NOTE — Telephone Encounter (Signed)
Noted and agree. 

## 2021-04-08 NOTE — Telephone Encounter (Signed)
Patient requires follow up for PNA and back pain. It is not appropriate to refill hydrocodone that was only meant for short duration (4 days) without another evaluation. Please have her make soonest available appt.  Gladys Damme, MD [place] [facility], PGY-3

## 2021-04-08 NOTE — Telephone Encounter (Signed)
Patient calls nurse line stating she is still having a lot of back pain. Patient reports she has been taking Hydrocodone and Ibuprofen with some relief. Patient reports she is almost out of Hydrocodone and would like a refill. Patient reports she has been taking antibiotics prescribed. Will forward to provider who saw patient.

## 2021-04-08 NOTE — Telephone Encounter (Signed)
Patient returns call to nurse line. Informed of below. We do not have any available appointments until 9/7. Patient reports that she cannot wait that long to be seen. Patient is requesting returned phone call from PCP to discuss concern further.    Please advise.   Talbot Grumbling, RN

## 2021-04-09 MED ORDER — HYDROCODONE-ACETAMINOPHEN 5-325 MG PO TABS: 1.0000 | ORAL_TABLET | Freq: Four times a day (QID) | ORAL | 0 refills | Status: DC | PRN

## 2021-04-09 NOTE — Telephone Encounter (Signed)
Called.  Longstanding pain in this area.  Cough x 2 weeks.  No fever or SOB.  Seen last week and treated for pneumonia.  (Pneumonia versus atelectasis.) Given 5 days of hydrocodone which was helping.  She is now out.  States cough is improving but pain is "excrutiating"  Will give 5 more days of hydrocodone.  Unclear to me how much of this is acute versus chronic pain.

## 2021-04-09 NOTE — Addendum Note (Signed)
Addended by: Zenia Resides on: 04/09/2021 12:30 PM   Modules accepted: Orders

## 2021-04-09 NOTE — Telephone Encounter (Signed)
Patient calls nurse line x2 leaving a VM requesting for a return call from PCP.

## 2021-04-10 ENCOUNTER — Ambulatory Visit: Payer: HMO | Admitting: Physical Therapy

## 2021-04-10 NOTE — Telephone Encounter (Signed)
Called and discussed.  Still no fever or SOB.  Just pain with cough and also without cough.  Limited options.  Try short term aleve (her choice of NSAID) with the assumption that she has a pleuritic componant to her pain.

## 2021-04-10 NOTE — Telephone Encounter (Signed)
Patient returns call to nurse line. Patient reports that she is not having adequate pain control on the hydrocodone-acetaminophen. Patient is requesting to speak with Dr. Andria Frames regarding management of her pain.   Please advise.   Talbot Grumbling, RN

## 2021-04-12 ENCOUNTER — Telehealth: Payer: Self-pay

## 2021-04-12 NOTE — Telephone Encounter (Signed)
Patient stated is still in pain. Nothing is helping.   [name] complains of extremely bad pain in her back. Pain is so bad she can not sit, lay flat or walk. Per patient she is taking all of her medication and nothing is helping.   Patient advised  go to an Urgent Care for pain relief. Dr. Ranell Patrick has left the office for the day. But a message will be sent for review when she returns.    Call back phone [phone].

## 2021-04-13 ENCOUNTER — Ambulatory Visit (INDEPENDENT_AMBULATORY_CARE_PROVIDER_SITE_OTHER): Payer: HMO

## 2021-04-13 ENCOUNTER — Other Ambulatory Visit: Payer: Self-pay

## 2021-04-13 ENCOUNTER — Encounter (HOSPITAL_COMMUNITY): Payer: Self-pay | Admitting: *Deleted

## 2021-04-13 ENCOUNTER — Ambulatory Visit (HOSPITAL_COMMUNITY)
Admission: EM | Admit: 2021-04-13 | Discharge: 2021-04-13 | Disposition: A | Discharge status: Home / Self Care | Payer: HMO | Attending: Emergency Medicine | Admitting: Emergency Medicine

## 2021-04-13 DIAGNOSIS — J189 Pneumonia, unspecified organism: Secondary | ICD-10-CM

## 2021-04-13 DIAGNOSIS — Z8249 Family history of ischemic heart disease and other diseases of the circulatory system: Secondary | ICD-10-CM | POA: Diagnosis not present

## 2021-04-13 DIAGNOSIS — Z880 Allergy status to penicillin: Secondary | ICD-10-CM | POA: Diagnosis not present

## 2021-04-13 DIAGNOSIS — Z885 Allergy status to narcotic agent status: Secondary | ICD-10-CM | POA: Insufficient documentation

## 2021-04-13 DIAGNOSIS — Z886 Allergy status to analgesic agent status: Secondary | ICD-10-CM | POA: Diagnosis not present

## 2021-04-13 DIAGNOSIS — M546 Pain in thoracic spine: Secondary | ICD-10-CM | POA: Insufficient documentation

## 2021-04-13 DIAGNOSIS — Z888 Allergy status to other drugs, medicaments and biological substances status: Secondary | ICD-10-CM | POA: Insufficient documentation

## 2021-04-13 DIAGNOSIS — Z881 Allergy status to other antibiotic agents status: Secondary | ICD-10-CM | POA: Insufficient documentation

## 2021-04-13 DIAGNOSIS — Z79899 Other long term (current) drug therapy: Secondary | ICD-10-CM | POA: Insufficient documentation

## 2021-04-13 DIAGNOSIS — Z882 Allergy status to sulfonamides status: Secondary | ICD-10-CM | POA: Insufficient documentation

## 2021-04-13 DIAGNOSIS — J9811 Atelectasis: Secondary | ICD-10-CM | POA: Diagnosis not present

## 2021-04-13 DIAGNOSIS — Z9049 Acquired absence of other specified parts of digestive tract: Secondary | ICD-10-CM | POA: Insufficient documentation

## 2021-04-13 DIAGNOSIS — Z87891 Personal history of nicotine dependence: Secondary | ICD-10-CM | POA: Insufficient documentation

## 2021-04-13 DIAGNOSIS — Z20822 Contact with and (suspected) exposure to covid-19: Secondary | ICD-10-CM | POA: Insufficient documentation

## 2021-04-13 HISTORY — DX: Pneumonia, unspecified organism: J18.9

## 2021-04-13 MED ORDER — DEXAMETHASONE SODIUM PHOSPHATE 10 MG/ML IJ SOLN
INTRAMUSCULAR | Status: AC
  Filled 2021-04-13: qty 1

## 2021-04-13 MED ORDER — HYDROCODONE-ACETAMINOPHEN 5-325 MG PO TABS: 1.0000 | ORAL_TABLET | ORAL | 0 refills | Status: DC | PRN

## 2021-04-13 MED ORDER — DEXAMETHASONE SODIUM PHOSPHATE 10 MG/ML IJ SOLN
10.0000 mg | Freq: Once | INTRAMUSCULAR | Status: AC
  Administered 2021-04-13: 10 mg via INTRAMUSCULAR

## 2021-04-13 NOTE — ED Triage Notes (Signed)
C/O right sided mid-back pain "for a long time" with progressive worsening over past couple weeks; states unable to sleep due to pain.  Pt currently being treated for pneumonia.  States normally takes Baclofen; was also placed on hydrocodone since pneumonia dx @ PCP office 5 days ago due to same back pain.  Pt denies any SOB.

## 2021-04-13 NOTE — Discharge Instructions (Addendum)
Your chest x-ray shows improvement of your right lower lobe pneumonia.  I have renewed your prescription for hydrocodone, your prescription should be available to pick up at your pharmacy within the next hour or so.  Injection today should both improve your pain as well as improve your work of breathing.  Please be sure to keep your upcoming appointment with Dr. Andria Frames.  Lease follow-up sooner should your pain become worse or your breathing become more labored.  Commend that you use your albuterol as prescribed as it will help you heal from the pneumonia more quickly.

## 2021-04-13 NOTE — ED Provider Notes (Signed)
[place]    CSN: [id number] Arrival date & time: 04/13/21  1358      History   Chief Complaint Chief Complaint  Patient presents with   Back Pain    HPI [name] is a 80 y.o. female.   Patient complains of thoracic back pain predominantly on the right-hand side, present for several weeks, partially relieved by hydrocodone which was prescribed for her by her primary care provider on April 09, 2021.  Patient states she has been taking them more frequently than every 6 hours because they just do not last long enough.  Patient states she is not been able to get comfortable to sleep at night, has a been sleeping on the floor because that is the only place that she can get reasonably comfortable.  Patient states she is having pain with sitting, standing, walking.  Patient denies numbness tingling in the upper and lower extremities. PDMP reviewed by me.  EMR reviewed.  Patient has some mild renal dysfunction and, of note, was diagnosed with pneumonia on August 26 after imaging was performed because she was having a significant amount of coughing and sputum production, she was started on doxycycline and cefdinir for treatment, patient states tolerating both well and taking regularly..  Blood pressure today is completely normal, patient states that this is not normal for her as her blood pressure typically runs in the [id number] and [id number] systolic when she comes into medical offices.  Patient states she has not changed any medications and has not tried anything new.  Also of note, patient's oxygen level is lower today than it was at her last office visit.  Patient denies significant shortness of breath, paroxysmal nocturnal dyspnea, orthopnea, pleuritic pain.  The history is provided by the patient.  Back Pain Location:  Thoracic spine Pain severity:  Severe Pain is:  Same all the time Duration:  2 weeks Timing:  Constant Progression:  Unchanged Chronicity:  Chronic Relieved by:   Narcotics and NSAIDs Worsened by:  Coughing Ineffective treatments:  Lying down, muscle relaxants, heating pad, being still and bed rest  Past Medical History:  Diagnosis Date   Asthma    Depression    Diabetes mellitus without complication ([place])    History of surgery on arm    right arm ( plate and screws)   Hypertension    Macular degeneration    Migraine    without aura   Pneumonia    Urinary incontinence     Patient Active Problem List   Diagnosis Date Noted   Polypharmacy 12/31/2020   Asthma exacerbation 11/28/2020   Itching of ear, bilateral 05/11/2020   Financial difficulties 11/16/2019   Osteoarthritis of knee, unspecified 06/13/2019   Rotator cuff syndrome of right shoulder 01/21/2019   Bladder prolapse, female, acquired 08/16/2018   Urinary incontinence 07/05/2018   Frequent falls 12/24/2017   Adjustment reaction with anxiety and depression 0000000   Ruptured silicone breast implant 12/08/2016   Cough 03/03/2016   Iron deficiency anemia due to chronic blood loss 10/31/2015   Chronic right-sided thoracic back pain 07/26/2015   Spinal stenosis of lumbar region 07/10/2015   Abdominal aortic atherosclerosis ([place]) 07/10/2015   UTI (urinary tract infection) 09/07/2013   Sleep apnea 05/22/2010   PERIPHERAL NEUROPATHY 02/06/2010   PULMONARY NODULE, SOLITARY 07/20/2009   Irritable bowel syndrome 11/12/2007   Non-insulin dependent type 2 diabetes mellitus ([place]) 10/08/2006   HYPERCHOLESTEROLEMIA 10/08/2006   HYPERTRIGLYCERIDEMIA 10/08/2006   Migraine headache 10/08/2006   CARPAL  TUNNEL SYNDROME 10/08/2006   HYPERTENSION, BENIGN SYSTEMIC 10/08/2006   ASTHMA, PERSISTENT, MODERATE 10/08/2006   REFLUX ESOPHAGITIS 10/08/2006   Eczema 10/08/2006   Psoriasis 10/08/2006   Osteoarthritis of spine 10/08/2006   VERTIGO NOS OR DIZZINESS 10/08/2006    Past Surgical History:  Procedure Laterality Date   ABDOMINAL HYSTERECTOMY     ?Lt. ovary remains   APPENDECTOMY      Arm surgery     BREAST BIOPSY Right 10/10/2009   BREAST CYST ASPIRATION  12/20/2013   BREAST EXCISIONAL BIOPSY Left 1998   BREAST EXCISIONAL BIOPSY Right 1993    OB History     Gravida  1   Para  1   Term  1   Preterm      AB      Living  1      SAB      IAB      Ectopic      Multiple      Live Births           Obstetric Comments  Patient has 2 children--she adopted her husband's son          Home Medications    Prior to Admission medications   Medication Sig Start Date End Date Taking? Authorizing Provider  acetic acid-hydrocortisone (VOSOL-HC) OTIC solution Place 3 drops into both ears 2 (two) times daily. 02/28/21  Yes Hensel, Jamal Collin, MD  baclofen (LIORESAL) 10 MG tablet Take 1 tablet (10 mg total) by mouth 3 (three) times daily as needed for muscle spasms. 02/05/21  Yes Raulkar, Clide Deutscher, MD  cefdinir (OMNICEF) 300 MG capsule Take 1 capsule (300 mg total) by mouth 2 (two) times daily. 04/05/21  Yes Gladys Damme, MD  doxycycline (VIBRA-TABS) 100 MG tablet Take 1 tablet (100 mg total) by mouth 2 (two) times daily. 04/05/21  Yes Gladys Damme, MD  DULoxetine (CYMBALTA) 60 MG capsule Take 60 mg by mouth daily.   Yes [provider]  enalapril (VASOTEC) 10 MG tablet Take 1 tablet (10 mg total) by mouth daily. 03/05/21  Yes McDiarmid, Blane Ohara, MD  meloxicam (MOBIC) 7.5 MG tablet TAKE 1 TABLET(7.5 MG) BY MOUTH DAILY 02/15/21  Yes Raulkar, Clide Deutscher, MD  metoprolol succinate (TOPROL-XL) 25 MG 24 hr tablet Take 1 tablet (25 mg total) by mouth at bedtime. 03/12/21  Yes Hensel, Jamal Collin, MD  Multiple Vitamin (MULTIVITAMIN WITH MINERALS) TABS tablet Take 1 tablet by mouth daily.   Yes [provider]  ondansetron (ZOFRAN) 4 MG tablet Take 1 tablet (4 mg total) by mouth every 8 (eight) hours as needed for nausea or vomiting. 02/28/21  Yes Hensel, Jamal Collin, MD  rosuvastatin (CRESTOR) 20 MG tablet TAKE 1 TABLET BY MOUTH ONCE DAILY **TAKES  THE  PLACE   OF  SIMVASTATIN** 01/16/21  Yes Hensel, Jamal Collin, MD  Semaglutide, 1 MG/DOSE, (OZEMPIC, 1 MG/DOSE,) 2 MG/1.5ML SOPN Inject 2 mg into the skin once a week. 01/14/21  Yes Hensel, Jamal Collin, MD  UNKNOWN TO PATIENT "Vitamin for macular degeneration"   Yes [provider]  albuterol (VENTOLIN HFA) 108 (90 Base) MCG/ACT inhaler INHALE 2 PUFFS INTO THE LUNGS EVERY 4 HOURS AS NEEDED FOR WHEEZING OR SHORTNESS OF BREATH 12/11/20   Zenia Resides, MD  benzonatate (TESSALON) 200 MG capsule Take 1 capsule (200 mg total) by mouth 2 (two) times daily as needed for cough. 04/04/21   Gladys Damme, MD  guaiFENesin-dextromethorphan [facility] DM) 100-10 MG/5ML syrup Take 5 mLs  by mouth every 4 (four) hours as needed for cough. 04/04/21   Raulkar, Clide Deutscher, MD  HYDROcodone-acetaminophen (NORCO) 5-325 MG tablet Take 1 tablet by mouth every 4 (four) hours as needed for up to 3 days for moderate pain or severe pain. 04/13/21 04/16/21  Lynden Oxford, PA-C  Lancets [facility] DELICA PLUS [id number]) MISC USE   TO CHECK GLUCOSE IN THE MORNING AND AT BEDTIME 11/01/20   Zenia Resides, MD  [facility] VERIO test strip USE  STRIP TO CHECK GLUCOSE TWICE DAILY AS DIRECTED 11/01/20   Zenia Resides, MD  triamcinolone ointment (KENALOG) 0.5 % Apply 1 application topically 2 (two) times daily. For elbows 10/10/19   Zenia Resides, MD    Family History Family History  Problem Relation Age of Onset   Hypertension Mother    Hypertension Father    Hearing loss Sister    Heart attack Sister 55       s/p 4 bypasses    Stroke Brother        49   Breast cancer Maternal Aunt    Ovarian cancer Maternal Aunt     Social History Social History   Tobacco Use   Smoking status: Former    Packs/day: 2.00    Years: 33.00    Pack years: 66.00    Types: Cigarettes    Start date: 08/11/1957    Quit date: 04/04/1991    Years since quitting: 30.0   Smokeless tobacco: Never  Vaping Use   Vaping Use: Never used  Substance Use  Topics   Alcohol use: No   Drug use: No     Allergies   Amoxicillin, Azithromycin, Butalbital, Clindamycin hcl, Januvia [sitagliptin], Levaquin [levofloxacin in d5w], Meperidine hcl, Sulfamethoxazole, Buspirone, Ketorolac, Lidocaine, Macrodantin [nitrofurantoin macrocrystal], Methocarbamol, Codeine phosphate, Etodolac, Gabapentin, Naproxen, Pentazocine-naloxone hcl, Sumatriptan, and Zonegran   Review of Systems Review of Systems  Musculoskeletal:  Positive for back pain.  All other systems reviewed and are negative.   Physical Exam Triage Vital Signs ED Triage Vitals  Enc Vitals Group     BP 04/13/21 1421 114/70     Pulse Rate 04/13/21 1421 97     Resp 04/13/21 1421 20     Temp 04/13/21 1421 97.9 F (36.6 C)     Temp Source 04/13/21 1421 Oral     SpO2 04/13/21 1421 91 %     Weight --      Height --      Head Circumference --      Peak Flow --      Pain Score 04/13/21 1425 10     Pain Loc --      Pain Edu? --      Excl. in [place]? --    No data found.  Updated Vital Signs BP 114/70   Pulse 97   Temp 97.9 F (36.6 C) (Oral)   Resp 20   SpO2 98% Comment: after taking few deep breaths  Visual Acuity Right Eye Distance:   Left Eye Distance:   Bilateral Distance:    Right Eye Near:   Left Eye Near:    Bilateral Near:     Physical Exam Constitutional:      Appearance: Normal appearance.  HENT:     Head: Normocephalic and atraumatic.     Right Ear: Tympanic membrane, ear canal and external ear normal.     Left Ear: Tympanic membrane, ear canal and external ear normal.     Nose: Nose normal.  Mouth/Throat:     Mouth: Mucous membranes are moist.  Eyes:     Extraocular Movements: Extraocular movements intact.     Conjunctiva/sclera: Conjunctivae normal.     Pupils: Pupils are equal, round, and reactive to light.  Cardiovascular:     Rate and Rhythm: Normal rate and regular rhythm.     Heart sounds: Normal heart sounds.  Pulmonary:     Effort: Pulmonary  effort is normal. No respiratory distress.     Breath sounds: Normal breath sounds. No stridor. No wheezing, rhonchi or rales.  Chest:     Chest wall: No tenderness.  Musculoskeletal:     Cervical back: Normal range of motion and neck supple.  Skin:    General: Skin is warm and dry.  Neurological:     General: No focal deficit present.     Mental Status: She is alert and oriented to person, place, and time.  Psychiatric:        Mood and Affect: Mood normal.        Behavior: Behavior normal.        Thought Content: Thought content normal.        Judgment: Judgment normal.     UC Treatments / Results  Labs (all labs ordered are listed, but only abnormal results are displayed) Labs Reviewed  SARS CORONAVIRUS 2 (TAT 6-24 HRS)    EKG   Radiology DG Chest 2 View  Result Date: 04/13/2021 CLINICAL DATA:  Pneumonia. Patient reports right-sided mid back pain. EXAM: CHEST - 2 VIEW COMPARISON:  Chest radiograph 04/05/2021 FINDINGS: Improving aeration with decreasing opacity in the posterior lung bases on the lateral view. Minimal residual atelectasis without residual confluent consolidation. No new airspace disease. Normal heart size and mediastinal contours. No pulmonary edema, pleural effusion or pneumothorax. Stable osseous structures. IMPRESSION: Resolving basilar pneumonia. Minimal residual atelectasis without residual confluent consolidation. Electronically Signed   By: Keith Rake M.D.   On: 04/13/2021 15:15    Procedures Procedures (including critical care time)  Medications Ordered in UC Medications  dexamethasone (DECADRON) injection 10 mg (10 mg Intramuscular Given 04/13/21 1518)    Initial Impression / Assessment and Plan / UC Course  I have reviewed the triage vital signs and the nursing notes.  Pertinent labs & imaging results that were available during my care of the patient were reviewed by me and considered in my medical decision making (see chart for details).      Chest x-ray today showed improvement of right lower lobe pneumonia demonstrated on imaging April 05, 2021.  Dexamethasone injection was provided in office today to assist with reduction of inflammation of thoracic spine that could be causing pain as well as possible COVID infection that is causing her O2 sats to drop.  Overall it is more than likely that her lower blood pressure and decreased oxygen saturation is due to concurrent narcotic use.  That being said renewed her prescription for hydrocodone for 3 more days until she is able to get to her follow-up appointment for her back pain.  Patient reassured that pneumonia is resolving.   Final Clinical Impressions(s) / UC Diagnoses   Final diagnoses:  Acute right-sided thoracic back pain  Pneumonia of right lower lobe due to infectious organism   Discharge Instructions   None    ED Prescriptions     Medication Sig Dispense Auth. Provider   HYDROcodone-acetaminophen (NORCO) 5-325 MG tablet Take 1 tablet by mouth every 4 (four) hours as needed for up to  3 days for moderate pain or severe pain. 18 tablet Lynden Oxford, PA-C      I have reviewed the PDMP during this encounter.   Lynden Oxford, PA-C 04/13/21 1534

## 2021-04-14 ENCOUNTER — Telehealth (HOSPITAL_COMMUNITY): Payer: Self-pay | Admitting: Emergency Medicine

## 2021-04-14 LAB — SARS CORONAVIRUS 2 (TAT 6-24 HRS): SARS Coronavirus 2: NEGATIVE

## 2021-04-14 MED ORDER — HYDROCODONE-ACETAMINOPHEN 5-325 MG PO TABS: 1.0000 | ORAL_TABLET | ORAL | 0 refills | Status: DC | PRN

## 2021-04-14 NOTE — Telephone Encounter (Signed)
Prescription for norco not received by pharmacy.  New prescription sent in.  PDMP reviewed by Lynden Oxford PA.

## 2021-04-16 ENCOUNTER — Telehealth: Payer: Self-pay

## 2021-04-16 ENCOUNTER — Emergency Department (HOSPITAL_COMMUNITY): Payer: HMO

## 2021-04-16 ENCOUNTER — Inpatient Hospital Stay (HOSPITAL_COMMUNITY)
Admission: EM | Admit: 2021-04-16 | Discharge: 2021-04-26 | DRG: 356 | Disposition: A | Discharge status: Skilled Nursing Facility | Payer: HMO | Attending: Family Medicine | Admitting: Family Medicine

## 2021-04-16 DIAGNOSIS — Z888 Allergy status to other drugs, medicaments and biological substances status: Secondary | ICD-10-CM

## 2021-04-16 DIAGNOSIS — I10 Essential (primary) hypertension: Secondary | ICD-10-CM | POA: Diagnosis present

## 2021-04-16 DIAGNOSIS — R079 Chest pain, unspecified: Secondary | ICD-10-CM | POA: Diagnosis not present

## 2021-04-16 DIAGNOSIS — H3530 Unspecified macular degeneration: Secondary | ICD-10-CM | POA: Diagnosis present

## 2021-04-16 DIAGNOSIS — G8929 Other chronic pain: Secondary | ICD-10-CM | POA: Diagnosis present

## 2021-04-16 DIAGNOSIS — K429 Umbilical hernia without obstruction or gangrene: Secondary | ICD-10-CM | POA: Diagnosis not present

## 2021-04-16 DIAGNOSIS — M48061 Spinal stenosis, lumbar region without neurogenic claudication: Secondary | ICD-10-CM | POA: Diagnosis not present

## 2021-04-16 DIAGNOSIS — R2689 Other abnormalities of gait and mobility: Secondary | ICD-10-CM | POA: Diagnosis not present

## 2021-04-16 DIAGNOSIS — K2101 Gastro-esophageal reflux disease with esophagitis, with bleeding: Secondary | ICD-10-CM | POA: Diagnosis not present

## 2021-04-16 DIAGNOSIS — Z20822 Contact with and (suspected) exposure to covid-19: Secondary | ICD-10-CM | POA: Diagnosis present

## 2021-04-16 DIAGNOSIS — N179 Acute kidney failure, unspecified: Secondary | ICD-10-CM | POA: Diagnosis present

## 2021-04-16 DIAGNOSIS — K3189 Other diseases of stomach and duodenum: Secondary | ICD-10-CM | POA: Diagnosis not present

## 2021-04-16 DIAGNOSIS — Z885 Allergy status to narcotic agent status: Secondary | ICD-10-CM

## 2021-04-16 DIAGNOSIS — I700 Atherosclerosis of aorta: Secondary | ICD-10-CM | POA: Diagnosis present

## 2021-04-16 DIAGNOSIS — Z6838 Body mass index (BMI) 38.0-38.9, adult: Secondary | ICD-10-CM

## 2021-04-16 DIAGNOSIS — Z882 Allergy status to sulfonamides status: Secondary | ICD-10-CM | POA: Diagnosis not present

## 2021-04-16 DIAGNOSIS — M549 Dorsalgia, unspecified: Secondary | ICD-10-CM | POA: Diagnosis not present

## 2021-04-16 DIAGNOSIS — E1142 Type 2 diabetes mellitus with diabetic polyneuropathy: Secondary | ICD-10-CM | POA: Diagnosis present

## 2021-04-16 DIAGNOSIS — G894 Chronic pain syndrome: Secondary | ICD-10-CM | POA: Diagnosis not present

## 2021-04-16 DIAGNOSIS — Z880 Allergy status to penicillin: Secondary | ICD-10-CM | POA: Diagnosis not present

## 2021-04-16 DIAGNOSIS — R58 Hemorrhage, not elsewhere classified: Secondary | ICD-10-CM | POA: Diagnosis not present

## 2021-04-16 DIAGNOSIS — Z7401 Bed confinement status: Secondary | ICD-10-CM | POA: Diagnosis not present

## 2021-04-16 DIAGNOSIS — I9589 Other hypotension: Secondary | ICD-10-CM | POA: Diagnosis not present

## 2021-04-16 DIAGNOSIS — Z79899 Other long term (current) drug therapy: Secondary | ICD-10-CM

## 2021-04-16 DIAGNOSIS — K921 Melena: Secondary | ICD-10-CM | POA: Diagnosis not present

## 2021-04-16 DIAGNOSIS — J45909 Unspecified asthma, uncomplicated: Secondary | ICD-10-CM | POA: Diagnosis present

## 2021-04-16 DIAGNOSIS — R531 Weakness: Secondary | ICD-10-CM | POA: Diagnosis not present

## 2021-04-16 DIAGNOSIS — T39395A Adverse effect of other nonsteroidal anti-inflammatory drugs [NSAID], initial encounter: Secondary | ICD-10-CM | POA: Diagnosis present

## 2021-04-16 DIAGNOSIS — I701 Atherosclerosis of renal artery: Secondary | ICD-10-CM | POA: Diagnosis not present

## 2021-04-16 DIAGNOSIS — R42 Dizziness and giddiness: Secondary | ICD-10-CM | POA: Diagnosis not present

## 2021-04-16 DIAGNOSIS — G4730 Sleep apnea, unspecified: Secondary | ICD-10-CM | POA: Diagnosis present

## 2021-04-16 DIAGNOSIS — D62 Acute posthemorrhagic anemia: Secondary | ICD-10-CM | POA: Diagnosis not present

## 2021-04-16 DIAGNOSIS — R578 Other shock: Secondary | ICD-10-CM | POA: Diagnosis present

## 2021-04-16 DIAGNOSIS — K2100 Gastro-esophageal reflux disease with esophagitis, without bleeding: Secondary | ICD-10-CM | POA: Diagnosis not present

## 2021-04-16 DIAGNOSIS — F419 Anxiety disorder, unspecified: Secondary | ICD-10-CM | POA: Diagnosis present

## 2021-04-16 DIAGNOSIS — K269 Duodenal ulcer, unspecified as acute or chronic, without hemorrhage or perforation: Secondary | ICD-10-CM | POA: Diagnosis not present

## 2021-04-16 DIAGNOSIS — M4800 Spinal stenosis, site unspecified: Secondary | ICD-10-CM | POA: Diagnosis present

## 2021-04-16 DIAGNOSIS — M6281 Muscle weakness (generalized): Secondary | ICD-10-CM | POA: Diagnosis not present

## 2021-04-16 DIAGNOSIS — K264 Chronic or unspecified duodenal ulcer with hemorrhage: Secondary | ICD-10-CM | POA: Diagnosis present

## 2021-04-16 DIAGNOSIS — G43909 Migraine, unspecified, not intractable, without status migrainosus: Secondary | ICD-10-CM | POA: Diagnosis present

## 2021-04-16 DIAGNOSIS — M79605 Pain in left leg: Secondary | ICD-10-CM | POA: Diagnosis not present

## 2021-04-16 DIAGNOSIS — G9059 Complex regional pain syndrome I of other specified site: Secondary | ICD-10-CM

## 2021-04-16 DIAGNOSIS — Z452 Encounter for adjustment and management of vascular access device: Secondary | ICD-10-CM | POA: Diagnosis not present

## 2021-04-16 DIAGNOSIS — F32A Depression, unspecified: Secondary | ICD-10-CM | POA: Diagnosis present

## 2021-04-16 DIAGNOSIS — R2681 Unsteadiness on feet: Secondary | ICD-10-CM | POA: Diagnosis not present

## 2021-04-16 DIAGNOSIS — M545 Low back pain, unspecified: Secondary | ICD-10-CM | POA: Diagnosis present

## 2021-04-16 DIAGNOSIS — R1314 Dysphagia, pharyngoesophageal phase: Secondary | ICD-10-CM | POA: Diagnosis not present

## 2021-04-16 DIAGNOSIS — E861 Hypovolemia: Secondary | ICD-10-CM | POA: Diagnosis not present

## 2021-04-16 DIAGNOSIS — M546 Pain in thoracic spine: Secondary | ICD-10-CM | POA: Diagnosis not present

## 2021-04-16 DIAGNOSIS — Z8249 Family history of ischemic heart disease and other diseases of the circulatory system: Secondary | ICD-10-CM

## 2021-04-16 DIAGNOSIS — E119 Type 2 diabetes mellitus without complications: Secondary | ICD-10-CM | POA: Diagnosis present

## 2021-04-16 DIAGNOSIS — D509 Iron deficiency anemia, unspecified: Secondary | ICD-10-CM | POA: Diagnosis not present

## 2021-04-16 DIAGNOSIS — E875 Hyperkalemia: Secondary | ICD-10-CM | POA: Diagnosis present

## 2021-04-16 DIAGNOSIS — G629 Polyneuropathy, unspecified: Secondary | ICD-10-CM | POA: Diagnosis not present

## 2021-04-16 DIAGNOSIS — E7800 Pure hypercholesterolemia, unspecified: Secondary | ICD-10-CM | POA: Diagnosis not present

## 2021-04-16 DIAGNOSIS — K922 Gastrointestinal hemorrhage, unspecified: Secondary | ICD-10-CM | POA: Diagnosis present

## 2021-04-16 DIAGNOSIS — Z87891 Personal history of nicotine dependence: Secondary | ICD-10-CM

## 2021-04-16 DIAGNOSIS — K5730 Diverticulosis of large intestine without perforation or abscess without bleeding: Secondary | ICD-10-CM | POA: Diagnosis not present

## 2021-04-16 DIAGNOSIS — J189 Pneumonia, unspecified organism: Secondary | ICD-10-CM | POA: Diagnosis not present

## 2021-04-16 DIAGNOSIS — F4321 Adjustment disorder with depressed mood: Secondary | ICD-10-CM | POA: Diagnosis present

## 2021-04-16 DIAGNOSIS — R911 Solitary pulmonary nodule: Secondary | ICD-10-CM | POA: Diagnosis present

## 2021-04-16 DIAGNOSIS — R41841 Cognitive communication deficit: Secondary | ICD-10-CM | POA: Diagnosis not present

## 2021-04-16 DIAGNOSIS — K274 Chronic or unspecified peptic ulcer, site unspecified, with hemorrhage: Secondary | ICD-10-CM | POA: Diagnosis not present

## 2021-04-16 DIAGNOSIS — E785 Hyperlipidemia, unspecified: Secondary | ICD-10-CM | POA: Diagnosis not present

## 2021-04-16 DIAGNOSIS — R0902 Hypoxemia: Secondary | ICD-10-CM | POA: Diagnosis not present

## 2021-04-16 DIAGNOSIS — Z881 Allergy status to other antibiotic agents status: Secondary | ICD-10-CM

## 2021-04-16 DIAGNOSIS — R000 Tachycardia, unspecified: Secondary | ICD-10-CM | POA: Diagnosis present

## 2021-04-16 DIAGNOSIS — R4182 Altered mental status, unspecified: Secondary | ICD-10-CM | POA: Diagnosis not present

## 2021-04-16 DIAGNOSIS — M79604 Pain in right leg: Secondary | ICD-10-CM | POA: Diagnosis not present

## 2021-04-16 DIAGNOSIS — Z791 Long term (current) use of non-steroidal anti-inflammatories (NSAID): Secondary | ICD-10-CM

## 2021-04-16 DIAGNOSIS — R279 Unspecified lack of coordination: Secondary | ICD-10-CM | POA: Diagnosis not present

## 2021-04-16 DIAGNOSIS — D500 Iron deficiency anemia secondary to blood loss (chronic): Secondary | ICD-10-CM | POA: Diagnosis not present

## 2021-04-16 DIAGNOSIS — R188 Other ascites: Secondary | ICD-10-CM | POA: Diagnosis not present

## 2021-04-16 DIAGNOSIS — R0689 Other abnormalities of breathing: Secondary | ICD-10-CM | POA: Diagnosis not present

## 2021-04-16 LAB — COMPREHENSIVE METABOLIC PANEL
ALT: 19 U/L (ref 0–44)
AST: 30 U/L (ref 15–41)
Albumin: 2.7 g/dL — ABNORMAL LOW (ref 3.5–5.0)
Alkaline Phosphatase: 50 U/L (ref 38–126)
Anion gap: 4 — ABNORMAL LOW (ref 5–15)
BUN: 48 mg/dL — ABNORMAL HIGH (ref 8–23)
CO2: 24 mmol/L (ref 22–32)
Calcium: 8.4 mg/dL — ABNORMAL LOW (ref 8.9–10.3)
Chloride: 110 mmol/L (ref 98–111)
Creatinine, Ser: 1.13 mg/dL — ABNORMAL HIGH (ref 0.44–1.00)
GFR, Estimated: 50 mL/min — ABNORMAL LOW (ref >60)
Glucose, Bld: 131 mg/dL — ABNORMAL HIGH (ref 70–99)
Potassium: 5.3 mmol/L — ABNORMAL HIGH (ref 3.5–5.1)
Sodium: 138 mmol/L (ref 135–145)
Total Bilirubin: 0.6 mg/dL (ref 0.3–1.2)
Total Protein: 4.7 g/dL — ABNORMAL LOW (ref 6.5–8.1)

## 2021-04-16 LAB — CBC WITH DIFFERENTIAL/PLATELET
Abs Immature Granulocytes: 0.06 10*3/uL (ref 0.00–0.07)
Basophils Absolute: 0.1 10*3/uL (ref 0.0–0.1)
Basophils Relative: 0 %
Eosinophils Absolute: 0 10*3/uL (ref 0.0–0.5)
Eosinophils Relative: 0 %
HCT: 25.1 % — ABNORMAL LOW (ref 36.0–46.0)
Hemoglobin: 7.4 g/dL — ABNORMAL LOW (ref 12.0–15.0)
Immature Granulocytes: 1 %
Lymphocytes Relative: 13 %
Lymphs Abs: 1.5 10*3/uL (ref 0.7–4.0)
MCH: 27.2 pg (ref 26.0–34.0)
MCHC: 29.5 g/dL — ABNORMAL LOW (ref 30.0–36.0)
MCV: 92.3 fL (ref 80.0–100.0)
Monocytes Absolute: 0.8 10*3/uL (ref 0.1–1.0)
Monocytes Relative: 7 %
Neutro Abs: 9 10*3/uL — ABNORMAL HIGH (ref 1.7–7.7)
Neutrophils Relative %: 79 %
Platelets: 236 10*3/uL (ref 150–400)
RBC: 2.72 MIL/uL — ABNORMAL LOW (ref 3.87–5.11)
RDW: 16.1 % — ABNORMAL HIGH (ref 11.5–15.5)
WBC: 11.5 10*3/uL — ABNORMAL HIGH (ref 4.0–10.5)
nRBC: 0 % (ref 0.0–0.2)

## 2021-04-16 LAB — LIPASE, BLOOD: Lipase: 27 U/L (ref 11–51)

## 2021-04-16 LAB — POC OCCULT BLOOD, ED: Fecal Occult Bld: POSITIVE — AB

## 2021-04-16 LAB — RESP PANEL BY RT-PCR (FLU A&B, COVID) ARPGX2
Influenza A by PCR: NEGATIVE
Influenza B by PCR: NEGATIVE
SARS Coronavirus 2 by RT PCR: NEGATIVE

## 2021-04-16 LAB — PROTIME-INR
INR: 1.3 — ABNORMAL HIGH (ref 0.8–1.2)
Prothrombin Time: 16.5 seconds — ABNORMAL HIGH (ref 11.4–15.2)

## 2021-04-16 LAB — TROPONIN I (HIGH SENSITIVITY): Troponin I (High Sensitivity): 26 ng/L — ABNORMAL HIGH (ref <18)

## 2021-04-16 LAB — PREPARE RBC (CROSSMATCH)

## 2021-04-16 LAB — ABO/RH: ABO/RH(D): O POS

## 2021-04-16 MED ORDER — FENTANYL CITRATE PF 50 MCG/ML IJ SOSY
50.0000 ug | PREFILLED_SYRINGE | Freq: Once | INTRAMUSCULAR | Status: AC
  Administered 2021-04-16: 50 ug via INTRAVENOUS
  Filled 2021-04-16: qty 1

## 2021-04-16 MED ORDER — SODIUM CHLORIDE 0.9 % IV BOLUS
500.0000 mL | Freq: Once | INTRAVENOUS | Status: AC
  Administered 2021-04-16: 500 mL via INTRAVENOUS

## 2021-04-16 MED ORDER — ONDANSETRON HCL 4 MG/2ML IJ SOLN
4.0000 mg | Freq: Once | INTRAMUSCULAR | Status: AC
  Administered 2021-04-16: 4 mg via INTRAVENOUS
  Filled 2021-04-16: qty 2

## 2021-04-16 MED ORDER — SODIUM CHLORIDE 0.9 % IV SOLN: Freq: Once | INTRAVENOUS | Status: DC

## 2021-04-16 MED ORDER — IOHEXOL 350 MG/ML SOLN
80.0000 mL | Freq: Once | INTRAVENOUS | Status: AC | PRN
  Administered 2021-04-16: 80 mL via INTRAVENOUS

## 2021-04-16 MED ORDER — PANTOPRAZOLE 80MG IVPB - SIMPLE MED
80.0000 mg | Freq: Once | INTRAVENOUS | Status: AC
  Administered 2021-04-16: 80 mg via INTRAVENOUS
  Filled 2021-04-16: qty 80

## 2021-04-16 MED ORDER — SODIUM CHLORIDE 0.9 % IV SOLN: 10.0000 mL/h | Freq: Once | INTRAVENOUS | Status: DC

## 2021-04-16 NOTE — Telephone Encounter (Signed)
Noted and agree. 

## 2021-04-16 NOTE — Telephone Encounter (Signed)
Patient calls nurse line regarding uncontrolled pain in back, neck, and stomach. Reports that she is having difficulty having bowel movements and urinating. Reports that she has history urinary incontinence and that she wears pads and depends.   Patient reports that she has been shaking, weak and having cold sweats. Denies chest pain, SHOB. Patient is having slight dizziness and lightheadedness.   Patient would like to speak with Dr. Andria Frames prior to having further evaluation in ED or UC.   Transferred patient to speak with Dr. Andria Frames directly.   Talbot Grumbling, RN

## 2021-04-16 NOTE — Telephone Encounter (Signed)
Patient returns call to nurse line. Patient now reports that she is having rectal bleeding. Patient reports that she did not go to UC this morning.   Advised that patient be evaluated as soon as possible for this and previous concerns. Patient requested to schedule follow up for next available appointment. Patient scheduled for 9/8. However, reiterated that I would recommend her being evaluated prior to this appointment.   Talbot Grumbling, RN

## 2021-04-16 NOTE — ED Notes (Signed)
Dr Tonie Griffith paged via [facility]

## 2021-04-16 NOTE — Telephone Encounter (Signed)
I discussed with patient.  She was seen 3 days ago in urgent care.  Repeat CXR shows resoloving pneumonia.  Given decadron.  Today, nausea, weak, pain, lightheaded.  Blood sugars in the 200s.   Subjectively, she sounds quite ill.  I do not fully trust her subjective symptoms - she has been sad and ill frequently since the death of her husband.  Has an appointment with me tomorrow.  Still, out of an abundance of caution, I encouraged her to be seen again at Urgent care.

## 2021-04-16 NOTE — ED Notes (Signed)
No response to secure chat, Dr Hal Hope paged via [facility]

## 2021-04-16 NOTE — ED Notes (Signed)
Notified EDP and Resident about pt current Bp.

## 2021-04-16 NOTE — ED Provider Notes (Signed)
[facility] EMERGENCY DEPARTMENT Provider Note   CSN: [id number] Arrival date & time: 04/16/21  1730    History Chief Complaint  Patient presents with   GI Bleeding    [name] is a 80 y.o. female with a past medical history of T2DM, HTN, asthma, macular degeneration, and depression who presents to the ED today with chief complaint of rectal bleeding.  The patient states that she started having some "burgundy-colored" stools this morning, about 2-1/2 hours ago it became bright red in nature.  She has had copious amounts of bleeding and states that it "seems to be in my urine."  This is never happened to her before.  She has not had a bowel movement in the past week, states that she is constipated at baseline.  She has not been able to pee so is unsure if she is having dysuria.  She has not vomited but has been pretty nauseous.  Also complaining of dry mouth and dizziness when sitting upright.  She is not on any NSAIDs, anticoagulants, or antiplatelet agents; states that she has not been on meloxicam for some time.  Also has chronic back pain with spasms, son states that this is worse than normal.  Denies fevers.  Endorses "slight chest pain" that is new.  She is also currently on doxycycline and cefdinir regimens for recent pneumonia.   HPI    Past Medical History:  Diagnosis Date   Asthma    Depression    Diabetes mellitus without complication ([place])    History of surgery on arm    right arm ( plate and screws)   Hypertension    Macular degeneration    Migraine    without aura   Pneumonia    Urinary incontinence     Patient Active Problem List   Diagnosis Date Noted   Polypharmacy 12/31/2020   Asthma exacerbation 11/28/2020   Itching of ear, bilateral 05/11/2020   Financial difficulties 11/16/2019   Osteoarthritis of knee, unspecified 06/13/2019   Rotator cuff syndrome of right shoulder 01/21/2019   Bladder prolapse, female, acquired 08/16/2018    Urinary incontinence 07/05/2018   Frequent falls 12/24/2017   Adjustment reaction with anxiety and depression 0000000   Ruptured silicone breast implant 12/08/2016   Cough 03/03/2016   Iron deficiency anemia due to chronic blood loss 10/31/2015   Chronic right-sided thoracic back pain 07/26/2015   Spinal stenosis of lumbar region 07/10/2015   Abdominal aortic atherosclerosis ([place]) 07/10/2015   UTI (urinary tract infection) 09/07/2013   Sleep apnea 05/22/2010   PERIPHERAL NEUROPATHY 02/06/2010   PULMONARY NODULE, SOLITARY 07/20/2009   Irritable bowel syndrome 11/12/2007   Non-insulin dependent type 2 diabetes mellitus ([place]) 10/08/2006   HYPERCHOLESTEROLEMIA 10/08/2006   HYPERTRIGLYCERIDEMIA 10/08/2006   Migraine headache 10/08/2006   CARPAL TUNNEL SYNDROME 10/08/2006   HYPERTENSION, BENIGN SYSTEMIC 10/08/2006   ASTHMA, PERSISTENT, MODERATE 10/08/2006   REFLUX ESOPHAGITIS 10/08/2006   Eczema 10/08/2006   Psoriasis 10/08/2006   Osteoarthritis of spine 10/08/2006   VERTIGO NOS OR DIZZINESS 10/08/2006    Past Surgical History:  Procedure Laterality Date   ABDOMINAL HYSTERECTOMY     ?Lt. ovary remains   APPENDECTOMY     Arm surgery     BREAST BIOPSY Right 10/10/2009   BREAST CYST ASPIRATION  12/20/2013   BREAST EXCISIONAL BIOPSY Left 1998   BREAST EXCISIONAL BIOPSY Right 1993     OB History     Gravida  1   Para  1  Term  1   Preterm      AB      Living  1      SAB      IAB      Ectopic      Multiple      Live Births           Obstetric Comments  Patient has 2 children--she adopted her husband's son         Family History  Problem Relation Age of Onset   Hypertension Mother    Hypertension Father    Hearing loss Sister    Heart attack Sister 68       s/p 4 bypasses    Stroke Brother        51   Breast cancer Maternal Aunt    Ovarian cancer Maternal Aunt     Social History   Tobacco Use   Smoking status: Former    Packs/day:  2.00    Years: 33.00    Pack years: 66.00    Types: Cigarettes    Start date: 08/11/1957    Quit date: 04/04/1991    Years since quitting: 30.0   Smokeless tobacco: Never  Vaping Use   Vaping Use: Never used  Substance Use Topics   Alcohol use: No   Drug use: No    Home Medications Prior to Admission medications   Medication Sig Start Date End Date Taking? Authorizing Provider  acetic acid-hydrocortisone (VOSOL-HC) OTIC solution Place 3 drops into both ears 2 (two) times daily. 02/28/21   Zenia Resides, MD  albuterol (VENTOLIN HFA) 108 (90 Base) MCG/ACT inhaler INHALE 2 PUFFS INTO THE LUNGS EVERY 4 HOURS AS NEEDED FOR WHEEZING OR SHORTNESS OF BREATH 12/11/20   Zenia Resides, MD  baclofen (LIORESAL) 10 MG tablet Take 1 tablet (10 mg total) by mouth 3 (three) times daily as needed for muscle spasms. 02/05/21   Raulkar, Clide Deutscher, MD  benzonatate (TESSALON) 200 MG capsule Take 1 capsule (200 mg total) by mouth 2 (two) times daily as needed for cough. 04/04/21   Gladys Damme, MD  cefdinir (OMNICEF) 300 MG capsule Take 1 capsule (300 mg total) by mouth 2 (two) times daily. 04/05/21   Gladys Damme, MD  doxycycline (VIBRA-TABS) 100 MG tablet Take 1 tablet (100 mg total) by mouth 2 (two) times daily. 04/05/21   Gladys Damme, MD  DULoxetine (CYMBALTA) 60 MG capsule Take 60 mg by mouth daily.    [provider]  enalapril (VASOTEC) 10 MG tablet Take 1 tablet (10 mg total) by mouth daily. 03/05/21   McDiarmid, Blane Ohara, MD  guaiFENesin-dextromethorphan (ROBITUSSIN DM) 100-10 MG/5ML syrup Take 5 mLs by mouth every 4 (four) hours as needed for cough. 04/04/21   Raulkar, Clide Deutscher, MD  HYDROcodone-acetaminophen (NORCO/VICODIN) 5-325 MG tablet Take 1 tablet by mouth every 4 (four) hours as needed. 04/14/21   Pearson Forster, NP  Lancets (ONETOUCH DELICA PLUS [id number]) MISC USE   TO CHECK GLUCOSE IN THE MORNING AND AT BEDTIME 11/01/20   Zenia Resides, MD  meloxicam (MOBIC) 7.5 MG tablet  TAKE 1 TABLET(7.5 MG) BY MOUTH DAILY 02/15/21   Raulkar, Clide Deutscher, MD  metoprolol succinate (TOPROL-XL) 25 MG 24 hr tablet Take 1 tablet (25 mg total) by mouth at bedtime. 03/12/21   Zenia Resides, MD  Multiple Vitamin (MULTIVITAMIN WITH MINERALS) TABS tablet Take 1 tablet by mouth daily.    [provider]  ondansetron (ZOFRAN) 4 MG  tablet Take 1 tablet (4 mg total) by mouth every 8 (eight) hours as needed for nausea or vomiting. 02/28/21   Hensel, Jamal Collin, MD  [facility] VERIO test strip USE  STRIP TO CHECK GLUCOSE TWICE DAILY AS DIRECTED 11/01/20   Hensel, Jamal Collin, MD  rosuvastatin (CRESTOR) 20 MG tablet TAKE 1 TABLET BY MOUTH ONCE DAILY **TAKES  THE  PLACE  OF  SIMVASTATIN** 01/16/21   Hensel, Jamal Collin, MD  Semaglutide, 1 MG/DOSE, (OZEMPIC, 1 MG/DOSE,) 2 MG/1.5ML SOPN Inject 2 mg into the skin once a week. 01/14/21   Zenia Resides, MD  triamcinolone ointment (KENALOG) 0.5 % Apply 1 application topically 2 (two) times daily. For elbows 10/10/19   Zenia Resides, MD  UNKNOWN TO PATIENT "Vitamin for macular degeneration"    [provider]    Allergies    Amoxicillin, Azithromycin, Butalbital, Clindamycin hcl, Januvia [sitagliptin], Levaquin [levofloxacin in d5w], Meperidine hcl, Sulfamethoxazole, Buspirone, Ketorolac, Lidocaine, Macrodantin [nitrofurantoin macrocrystal], Methocarbamol, Codeine phosphate, Etodolac, Gabapentin, Naproxen, Pentazocine-naloxone hcl, Sumatriptan, and Zonegran  Review of Systems   Review of Systems  Constitutional:  Negative for chills and fever.  Respiratory:  Negative for shortness of breath.   Cardiovascular:  Positive for chest pain. Negative for leg swelling.  Gastrointestinal:  Positive for abdominal pain, blood in stool, constipation and nausea. Negative for vomiting.  Endocrine: Negative.   Genitourinary:  Positive for decreased urine volume.  Neurological:  Positive for weakness and light-headedness. Negative for numbness.   Hematological: Negative.   Psychiatric/Behavioral: Negative.     Physical Exam Updated Vital Signs BP (!) 94/46   Pulse 100   Temp (!) 97.5 F (36.4 C) (Oral)   Resp 15   Ht '4\' 11"'$  (1.499 m)   Wt 76.2 kg   SpO2 100%   BMI 33.93 kg/m   Physical Exam Exam conducted with a chaperone present.  Constitutional:      General: She is not in acute distress.    Appearance: Normal appearance.  HENT:     Head: Normocephalic and atraumatic.     Mouth/Throat:     Mouth: Mucous membranes are dry.  Eyes:     Conjunctiva/sclera: Conjunctivae normal.     Pupils: Pupils are equal, round, and reactive to light.  Cardiovascular:     Rate and Rhythm: Regular rhythm. Tachycardia present.     Heart sounds: No murmur heard.   No friction rub. No gallop.  Pulmonary:     Effort: Pulmonary effort is normal.     Breath sounds: Normal breath sounds. No wheezing or rales.  Abdominal:     General: Bowel sounds are normal.     Palpations: Abdomen is soft.     Tenderness: There is abdominal tenderness. There is right CVA tenderness. There is no left CVA tenderness, guarding or rebound.  Genitourinary:    Comments: Rectal exam with pt lying on side: multiple skin tags and bright red blood visualized. No internal hemorrhoids or fissures palpated. There was bright red blood on gloved finger. Musculoskeletal:        General: No swelling, deformity or signs of injury.     Cervical back: Neck supple.  Skin:    General: Skin is warm and dry.  Neurological:     General: No focal deficit present.     Mental Status: She is alert and oriented to person, place, and time. Mental status is at baseline.  Psychiatric:        Mood and Affect: Mood normal.  Behavior: Behavior normal.    ED Results / Procedures / Treatments   Labs (all labs ordered are listed, but only abnormal results are displayed) Labs Reviewed  COMPREHENSIVE METABOLIC PANEL - Abnormal; Notable for the following components:       Result Value   Potassium 5.3 (*)    Glucose, Bld 131 (*)    BUN 48 (*)    Creatinine, Ser 1.13 (*)    Calcium 8.4 (*)    Total Protein 4.7 (*)    Albumin 2.7 (*)    GFR, Estimated 50 (*)    Anion gap 4 (*)    All other components within normal limits  CBC WITH DIFFERENTIAL/PLATELET - Abnormal; Notable for the following components:   WBC 11.5 (*)    RBC 2.72 (*)    Hemoglobin 7.4 (*)    HCT 25.1 (*)    MCHC 29.5 (*)    RDW 16.1 (*)    Neutro Abs 9.0 (*)    All other components within normal limits  PROTIME-INR - Abnormal; Notable for the following components:   Prothrombin Time 16.5 (*)    INR 1.3 (*)    All other components within normal limits  POC OCCULT BLOOD, ED - Abnormal; Notable for the following components:   Fecal Occult Bld POSITIVE (*)    All other components within normal limits  TROPONIN I (HIGH SENSITIVITY) - Abnormal; Notable for the following components:   Troponin I (High Sensitivity) 26 (*)    All other components within normal limits  RESP PANEL BY RT-PCR (FLU A&B, COVID) ARPGX2  URINE CULTURE  LIPASE, BLOOD  URINALYSIS, ROUTINE W REFLEX MICROSCOPIC  PREPARE RBC (CROSSMATCH)  TYPE AND SCREEN  ABO/RH  TROPONIN I (HIGH SENSITIVITY)    EKG EKG Interpretation  Date/Time:  Tuesday April 16 2021 18:53:23 EDT Ventricular Rate:  99 PR Interval:  155 QRS Duration: 86 QT Interval:  346 QTC Calculation: 444 R Axis:   2 Text Interpretation: Sinus rhythm Left ventricular hypertrophy Confirmed by Nanda Quinton [phone]) on 04/16/2021 6:55:27 PM  Radiology DG Chest Portable 1 View  Result Date: 04/16/2021 CLINICAL DATA:  Chest pain EXAM: PORTABLE CHEST 1 VIEW COMPARISON:  04/13/2021. FINDINGS: Low lung volumes. Unchanged cardiac and mediastinal contours. No focal consolidative opacity. No pleural effusion or pneumothorax. No acute osseous abnormality. IMPRESSION: No active disease. Electronically Signed   By: Merilyn Baba M.D.   On: 04/16/2021 19:08   CT  ANGIO GI BLEED  Result Date: 04/16/2021 CLINICAL DATA:  Left upper quadrant abdominal pain. Query diverticular bleed. EXAM: CTA ABDOMEN AND PELVIS WITHOUT AND WITH CONTRAST TECHNIQUE: Multidetector CT imaging of the abdomen and pelvis was performed using the standard protocol during bolus administration of intravenous contrast. Multiplanar reconstructed images and MIPs were obtained and reviewed to evaluate the vascular anatomy. CONTRAST:  36m OMNIPAQUE IOHEXOL 350 MG/ML SOLN COMPARISON:  Abdominal CT 10/08/2015 FINDINGS: VASCULAR Aorta: Moderately advanced aortic atherosclerosis. 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 This is not significantly changed in appearance from noncontrast CT appearance of 2017. There is no periaortic stranding or evidence of acute dissection. Celiac: Patent without evidence of aneurysm, dissection, vasculitis or significant stenosis. Minimal plaque at the origin. SMA: Patent without evidence of aneurysm, dissection, vasculitis or significant stenosis. Minimal plaque at the origin. Renals: Single bilateral renal arteries. Plaque at the origin of the left greater than right renal artery but no severe stenosis. No dissection or  acute findings. IMA: Patent at the origin with intermittent flow, diminutive in caliber. No acute findings. Inflow: Moderately calcified. No aneurysm, dissection, or acute findings. Proximal Outflow: Bilateral common femoral and visualized portions of the superficial and profunda femoral arteries are patent without evidence of aneurysm, dissection, vasculitis or significant stenosis. Veins: Portal venous phase imaging obtained demonstrating patency of the hepatic veins, IVC, and iliac system. The portal and superior mesenteric veins are patent. The splenic vein is patent. No acute venous findings. Review of the MIP images confirms the above findings. NON-VASCULAR Lower chest:  Minimal subsegmental scarring in the left greater than right lower lobe. Hepatobiliary: Borderline hepatic steatosis. No focal hepatic abnormality. Gallbladder is displaced anteriorly but no calcified gallstone or inflammation. No biliary dilatation. Pancreas: Unremarkable. No pancreatic ductal dilatation or surrounding inflammatory changes. Spleen: Normal in size without focal abnormality. Adrenals/Urinary Tract: Mild left adrenal thickening. No adrenal nodule. Mild symmetric perinephric edema which is nonspecific. No hydronephrosis. No renal calculi. Tiny hypodensities arising from the lower right kidney is too small to characterize but likely small cysts. Unremarkable urinary bladder. Stomach/Bowel: The stomach is distended with ingested material. Slightly high-density material in the stomach is present on both pre and post-contrast exam is not related to vasculature. There is no accumulation of contrast within the GI tract to localize GI bleed. High-density within a right colonic diverticulum is seen on precontrast imaging and not related to vascular extravasation. Occasional fluid-filled loops of small bowel without obstruction or inflammation. Appendectomy per history. The left colon is decompressed. There may be faint pericolonic stranding adjacent to the distal descending colon, for example series 5, image 57. No diverticulitis. Lymphatic: No abdominopelvic adenopathy. Reproductive: Status post hysterectomy. No adnexal masses. Other: Tiny fat containing umbilical hernia. No free air, free fluid, or intra-abdominal fluid collection. Musculoskeletal: There are no acute or suspicious osseous abnormalities. Multilevel degenerative change in the lumbar spine. IMPRESSION: VASCULAR 1. No accumulation of contrast in the GI tract to suggest GI bleed. 2. 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 No acute dissection or aortic findings. Recommend  follow-up every 3 years. This recommendation follows ACR consensus guidelines: White Paper of the ACR Incidental Findings Committee II on Vascular Findings. J Am Coll Radiol 2013; 10:789-794. 3. Moderate thoracoabdominal aortic atherosclerosis. NON-VASCULAR 1. Questionable pericolonic fat stranding adjacent to the distal descending colon, may represent minimal colitis in the appropriate clinical setting. The left colon is decompressed and not well assessed. 2. Minimal colonic diverticulosis without focal diverticulitis. 3. Mild hepatic steatosis. Electronically Signed   By: Keith Rake M.D.   On: 04/16/2021 21:31    Procedures Procedures   Medications Ordered in ED Medications  0.9 %  sodium chloride infusion (has no administration in time range)  0.9 %  sodium chloride infusion (has no administration in time range)  sodium chloride 0.9 % bolus 500 mL (0 mLs Intravenous Stopped 04/16/21 2124)  pantoprazole (PROTONIX) 80 mg /NS 100 mL IVPB (0 mg Intravenous Stopped 04/16/21 2210)  fentaNYL (SUBLIMAZE) injection 50 mcg (50 mcg Intravenous Given 04/16/21 1934)  ondansetron (ZOFRAN) injection 4 mg (4 mg Intravenous Given 04/16/21 1935)  iohexol (OMNIPAQUE) 350 MG/ML injection 80 mL (80 mLs Intravenous Contrast Given 04/16/21 2111)  sodium chloride 0.9 % bolus 500 mL (0 mLs Intravenous Stopped 04/16/21 2224)    ED Course  I have reviewed the triage vital signs and the nursing notes.  Pertinent labs & imaging results that were  available during my care of the patient were reviewed by me and considered in my medical decision making (see chart for details).    MDM Rules/Calculators/A&P                         [name] is a 80 y.o. female with a past medical history of T2DM, HTN, asthma, macular degeneration, and depression who presents to the ED today with chief complaint of rectal bleeding. Differential for bleed includes diverticular bleed, angiodysplasia, colitis. Could also have a UTI given c/o  hematuria and abdominal pain in the suprapubic region. Given pt complaints of CP, EKG and trops obtained for ACS rule-out.   5:49 PM: CBG 184, initial BP 80/50 after bolus 120/56, HR 90, RA 89% placed on 2L  @ 96%. EKG without ischemic changes  6:15 PM: Troponin #1 26, WBC 11.5, Hgb 7.4, PT 16.5, INR 1.3, lipase WNL  6:30 PM: FOBT +, CXR unremarkable, started second fluid bolus and IV protonix  8:30 PM: CT angio showed no signs of active bleeding, with questionable pericolonic fat stranding possibly representing minimal colitis.  10:00 PM: BP 88/51, BP remeasured at systolic 78 in the room. Pt reported passing more blood per rectum. Given 500 mL bolus and 1 unit of PRBCs.  10:30 PM: Spoke with Dr. Tarri Glenn with [facility], agrees with plan for blood/resuscitation, they will consult in the AM.   Final Clinical Impression(s) / ED Diagnoses Final diagnoses:  Gastrointestinal hemorrhage, unspecified gastrointestinal hemorrhage type    Rx / DC Orders ED Discharge Orders     None        Orvis Brill, MD 04/16/21 2241    Margette Fast, MD 04/16/21 2354

## 2021-04-16 NOTE — ED Notes (Signed)
Secure chat sent to dr Hal Hope in regards to hypotension, waiting for response

## 2021-04-16 NOTE — ED Triage Notes (Signed)
Pt bib ems from home with c/o rectal bleeding. Pt endorses bright red bleeding. Pt described blood coming out as urine.  Pt stated she was taking hydrocodone for over 9 days for back pain. Pt endorses constipation and dry mouth. Pt has not had an appetite for a week d/t nauseous. Pt received 100 mcg Fentanyl en route and 700 ml NS.   HX: DM  CBG 184 Initial BP 80/50 after bolus 120/56 HR 90 RA 89% placed on 2L [place] @ 96%

## 2021-04-16 NOTE — Telephone Encounter (Signed)
Message left for call back 

## 2021-04-17 ENCOUNTER — Other Ambulatory Visit: Payer: Self-pay

## 2021-04-17 ENCOUNTER — Encounter (HOSPITAL_COMMUNITY): Payer: Self-pay | Admitting: Family Medicine

## 2021-04-17 ENCOUNTER — Encounter (HOSPITAL_COMMUNITY)
Admission: EM | Disposition: A | Discharge status: Skilled Nursing Facility | Payer: Self-pay | Source: Home / Self Care | Attending: Family Medicine

## 2021-04-17 ENCOUNTER — Inpatient Hospital Stay (HOSPITAL_COMMUNITY): Payer: HMO | Admitting: Anesthesiology

## 2021-04-17 ENCOUNTER — Encounter: Payer: HMO | Admitting: Physical Therapy

## 2021-04-17 ENCOUNTER — Inpatient Hospital Stay (HOSPITAL_COMMUNITY): Payer: HMO

## 2021-04-17 DIAGNOSIS — G9059 Complex regional pain syndrome I of other specified site: Secondary | ICD-10-CM | POA: Diagnosis not present

## 2021-04-17 DIAGNOSIS — M79605 Pain in left leg: Secondary | ICD-10-CM | POA: Diagnosis not present

## 2021-04-17 DIAGNOSIS — G894 Chronic pain syndrome: Secondary | ICD-10-CM

## 2021-04-17 DIAGNOSIS — D62 Acute posthemorrhagic anemia: Secondary | ICD-10-CM | POA: Diagnosis not present

## 2021-04-17 DIAGNOSIS — Z880 Allergy status to penicillin: Secondary | ICD-10-CM | POA: Diagnosis not present

## 2021-04-17 DIAGNOSIS — R578 Other shock: Secondary | ICD-10-CM | POA: Diagnosis present

## 2021-04-17 DIAGNOSIS — Z881 Allergy status to other antibiotic agents status: Secondary | ICD-10-CM | POA: Diagnosis not present

## 2021-04-17 DIAGNOSIS — Z882 Allergy status to sulfonamides status: Secondary | ICD-10-CM | POA: Diagnosis not present

## 2021-04-17 DIAGNOSIS — G43909 Migraine, unspecified, not intractable, without status migrainosus: Secondary | ICD-10-CM | POA: Diagnosis present

## 2021-04-17 DIAGNOSIS — K2101 Gastro-esophageal reflux disease with esophagitis, with bleeding: Secondary | ICD-10-CM

## 2021-04-17 DIAGNOSIS — J45909 Unspecified asthma, uncomplicated: Secondary | ICD-10-CM | POA: Diagnosis present

## 2021-04-17 DIAGNOSIS — E1142 Type 2 diabetes mellitus with diabetic polyneuropathy: Secondary | ICD-10-CM | POA: Diagnosis present

## 2021-04-17 DIAGNOSIS — E861 Hypovolemia: Secondary | ICD-10-CM | POA: Insufficient documentation

## 2021-04-17 DIAGNOSIS — N179 Acute kidney failure, unspecified: Secondary | ICD-10-CM | POA: Diagnosis present

## 2021-04-17 DIAGNOSIS — F419 Anxiety disorder, unspecified: Secondary | ICD-10-CM | POA: Diagnosis present

## 2021-04-17 DIAGNOSIS — R911 Solitary pulmonary nodule: Secondary | ICD-10-CM | POA: Diagnosis present

## 2021-04-17 DIAGNOSIS — H3530 Unspecified macular degeneration: Secondary | ICD-10-CM | POA: Diagnosis present

## 2021-04-17 DIAGNOSIS — I700 Atherosclerosis of aorta: Secondary | ICD-10-CM | POA: Diagnosis present

## 2021-04-17 DIAGNOSIS — M545 Low back pain, unspecified: Secondary | ICD-10-CM | POA: Diagnosis present

## 2021-04-17 DIAGNOSIS — G8929 Other chronic pain: Secondary | ICD-10-CM | POA: Diagnosis not present

## 2021-04-17 DIAGNOSIS — E119 Type 2 diabetes mellitus without complications: Secondary | ICD-10-CM | POA: Diagnosis present

## 2021-04-17 DIAGNOSIS — Z888 Allergy status to other drugs, medicaments and biological substances status: Secondary | ICD-10-CM | POA: Diagnosis not present

## 2021-04-17 DIAGNOSIS — F4321 Adjustment disorder with depressed mood: Secondary | ICD-10-CM | POA: Diagnosis present

## 2021-04-17 DIAGNOSIS — K922 Gastrointestinal hemorrhage, unspecified: Secondary | ICD-10-CM | POA: Diagnosis not present

## 2021-04-17 DIAGNOSIS — K264 Chronic or unspecified duodenal ulcer with hemorrhage: Secondary | ICD-10-CM | POA: Diagnosis present

## 2021-04-17 DIAGNOSIS — I9589 Other hypotension: Secondary | ICD-10-CM | POA: Diagnosis not present

## 2021-04-17 DIAGNOSIS — M79604 Pain in right leg: Secondary | ICD-10-CM | POA: Diagnosis not present

## 2021-04-17 DIAGNOSIS — I10 Essential (primary) hypertension: Secondary | ICD-10-CM | POA: Diagnosis present

## 2021-04-17 DIAGNOSIS — Z20822 Contact with and (suspected) exposure to covid-19: Secondary | ICD-10-CM | POA: Diagnosis present

## 2021-04-17 DIAGNOSIS — M546 Pain in thoracic spine: Secondary | ICD-10-CM | POA: Diagnosis not present

## 2021-04-17 DIAGNOSIS — Z6838 Body mass index (BMI) 38.0-38.9, adult: Secondary | ICD-10-CM | POA: Diagnosis not present

## 2021-04-17 DIAGNOSIS — F32A Depression, unspecified: Secondary | ICD-10-CM | POA: Diagnosis present

## 2021-04-17 HISTORY — PX: RADIOLOGY WITH ANESTHESIA: SHX6223

## 2021-04-17 HISTORY — PX: IR ANGIOGRAM SELECTIVE EACH ADDITIONAL VESSEL: IMG667

## 2021-04-17 HISTORY — PX: IR EMBO ART  VEN HEMORR LYMPH EXTRAV  INC GUIDE ROADMAPPING: IMG5450

## 2021-04-17 HISTORY — PX: IR ANGIOGRAM VISCERAL SELECTIVE: IMG657

## 2021-04-17 HISTORY — PX: ESOPHAGOGASTRODUODENOSCOPY: SHX5428

## 2021-04-17 HISTORY — PX: IR US GUIDE VASC ACCESS RIGHT: IMG2390

## 2021-04-17 LAB — PREPARE RBC (CROSSMATCH)

## 2021-04-17 LAB — POCT I-STAT 7, (LYTES, BLD GAS, ICA,H+H)
Acid-base deficit: 4 mmol/L — ABNORMAL HIGH (ref 0.0–2.0)
Acid-base deficit: 4 mmol/L — ABNORMAL HIGH (ref 0.0–2.0)
Bicarbonate: 22.2 mmol/L (ref 20.0–28.0)
Bicarbonate: 21.5 mmol/L (ref 20.0–28.0)
Calcium, Ion: 1.07 mmol/L — ABNORMAL LOW (ref 1.15–1.40)
Calcium, Ion: 0.98 mmol/L — ABNORMAL LOW (ref 1.15–1.40)
HCT: 30 % — ABNORMAL LOW (ref 36.0–46.0)
HCT: 22 % — ABNORMAL LOW (ref 36.0–46.0)
Hemoglobin: 10.2 g/dL — ABNORMAL LOW (ref 12.0–15.0)
Hemoglobin: 7.5 g/dL — ABNORMAL LOW (ref 12.0–15.0)
O2 Saturation: 100 %
O2 Saturation: 100 %
Patient temperature: 36
Patient temperature: 35
Potassium: 6.4 mmol/L (ref 3.5–5.1)
Potassium: 5.6 mmol/L — ABNORMAL HIGH (ref 3.5–5.1)
Sodium: 140 mmol/L (ref 135–145)
Sodium: 142 mmol/L (ref 135–145)
TCO2: 24 mmol/L (ref 22–32)
TCO2: 23 mmol/L (ref 22–32)
pCO2 arterial: 43.7 mmHg (ref 32.0–48.0)
pCO2 arterial: 37.7 mmHg (ref 32.0–48.0)
pH, Arterial: 7.308 — ABNORMAL LOW (ref 7.350–7.450)
pH, Arterial: 7.354 (ref 7.350–7.450)
pO2, Arterial: 515 mmHg — ABNORMAL HIGH (ref 83.0–108.0)
pO2, Arterial: 411 mmHg — ABNORMAL HIGH (ref 83.0–108.0)

## 2021-04-17 LAB — CBC
HCT: 23.8 % — ABNORMAL LOW (ref 36.0–46.0)
Hemoglobin: 7.9 g/dL — ABNORMAL LOW (ref 12.0–15.0)
MCH: 30.9 pg (ref 26.0–34.0)
MCHC: 33.2 g/dL (ref 30.0–36.0)
MCV: 93 fL (ref 80.0–100.0)
Platelets: 129 10*3/uL — ABNORMAL LOW (ref 150–400)
RBC: 2.56 MIL/uL — ABNORMAL LOW (ref 3.87–5.11)
RDW: 14.5 % (ref 11.5–15.5)
WBC: 18.3 10*3/uL — ABNORMAL HIGH (ref 4.0–10.5)
nRBC: 0.1 % (ref 0.0–0.2)

## 2021-04-17 LAB — COMPREHENSIVE METABOLIC PANEL
ALT: 57 U/L — ABNORMAL HIGH (ref 0–44)
AST: 78 U/L — ABNORMAL HIGH (ref 15–41)
Albumin: 1.7 g/dL — ABNORMAL LOW (ref 3.5–5.0)
Alkaline Phosphatase: 33 U/L — ABNORMAL LOW (ref 38–126)
Anion gap: 6 (ref 5–15)
BUN: 61 mg/dL — ABNORMAL HIGH (ref 8–23)
CO2: 16 mmol/L — ABNORMAL LOW (ref 22–32)
Calcium: 7.2 mg/dL — ABNORMAL LOW (ref 8.9–10.3)
Chloride: 116 mmol/L — ABNORMAL HIGH (ref 98–111)
Creatinine, Ser: 1.32 mg/dL — ABNORMAL HIGH (ref 0.44–1.00)
GFR, Estimated: 41 mL/min — ABNORMAL LOW (ref >60)
Glucose, Bld: 182 mg/dL — ABNORMAL HIGH (ref 70–99)
Potassium: 5.2 mmol/L — ABNORMAL HIGH (ref 3.5–5.1)
Sodium: 138 mmol/L (ref 135–145)
Total Bilirubin: 0.5 mg/dL (ref 0.3–1.2)
Total Protein: 3 g/dL — ABNORMAL LOW (ref 6.5–8.1)

## 2021-04-17 LAB — POCT I-STAT, CHEM 8
BUN: 44 mg/dL — ABNORMAL HIGH (ref 8–23)
Calcium, Ion: 1.14 mmol/L — ABNORMAL LOW (ref 1.15–1.40)
Chloride: 112 mmol/L — ABNORMAL HIGH (ref 98–111)
Creatinine, Ser: 1 mg/dL (ref 0.44–1.00)
Glucose, Bld: 175 mg/dL — ABNORMAL HIGH (ref 70–99)
HCT: 19 % — ABNORMAL LOW (ref 36.0–46.0)
Hemoglobin: 6.5 g/dL — CL (ref 12.0–15.0)
Potassium: 5.4 mmol/L — ABNORMAL HIGH (ref 3.5–5.1)
Sodium: 142 mmol/L (ref 135–145)
TCO2: 20 mmol/L — ABNORMAL LOW (ref 22–32)

## 2021-04-17 LAB — MRSA NEXT GEN BY PCR, NASAL: MRSA by PCR Next Gen: NOT DETECTED

## 2021-04-17 LAB — CBG MONITORING, ED
Glucose-Capillary: 171 mg/dL — ABNORMAL HIGH (ref 70–99)
Glucose-Capillary: 153 mg/dL — ABNORMAL HIGH (ref 70–99)

## 2021-04-17 LAB — TROPONIN I (HIGH SENSITIVITY): Troponin I (High Sensitivity): 23 ng/L — ABNORMAL HIGH (ref <18)

## 2021-04-17 LAB — LACTIC ACID, PLASMA: Lactic Acid, Venous: 1.3 mmol/L (ref 0.5–1.9)

## 2021-04-17 LAB — HEMOGLOBIN A1C
Hgb A1c MFr Bld: 5.6 % (ref 4.8–5.6)
Mean Plasma Glucose: 114.02 mg/dL

## 2021-04-17 LAB — GLUCOSE, CAPILLARY: Glucose-Capillary: 178 mg/dL — ABNORMAL HIGH (ref 70–99)

## 2021-04-17 SURGERY — RADIOLOGY WITH ANESTHESIA
Anesthesia: General

## 2021-04-17 SURGERY — EGD (ESOPHAGOGASTRODUODENOSCOPY)
Anesthesia: Monitor Anesthesia Care

## 2021-04-17 MED ORDER — SODIUM CHLORIDE 0.9 % IV SOLN: INTRAVENOUS | Status: DC

## 2021-04-17 MED ORDER — HYDROMORPHONE HCL 1 MG/ML IJ SOLN
0.2500 mg | INTRAMUSCULAR | Status: DC | PRN
  Administered 2021-04-17: 0.25 mg via INTRAVENOUS

## 2021-04-17 MED ORDER — ONDANSETRON HCL 4 MG/2ML IJ SOLN: 4.0000 mg | Freq: Four times a day (QID) | INTRAMUSCULAR | Status: DC | PRN

## 2021-04-17 MED ORDER — INSULIN ASPART 100 UNIT/ML IJ SOLN: 0.0000 [IU] | Freq: Three times a day (TID) | INTRAMUSCULAR | Status: DC

## 2021-04-17 MED ORDER — DEXAMETHASONE SODIUM PHOSPHATE 10 MG/ML IJ SOLN
INTRAMUSCULAR | Status: DC | PRN
  Administered 2021-04-17: 10 mg via INTRAVENOUS

## 2021-04-17 MED ORDER — ROSUVASTATIN CALCIUM 20 MG PO TABS
20.0000 mg | ORAL_TABLET | Freq: Every day | ORAL | Status: DC
  Administered 2021-04-17 – 2021-04-26 (×10): 20 mg via ORAL
  Filled 2021-04-17 (×10): qty 1

## 2021-04-17 MED ORDER — SODIUM CHLORIDE 0.9 % IV SOLN: Freq: Once | INTRAVENOUS | Status: AC

## 2021-04-17 MED ORDER — PHENYLEPHRINE 40 MCG/ML (10ML) SYRINGE FOR IV PUSH (FOR BLOOD PRESSURE SUPPORT)
PREFILLED_SYRINGE | INTRAVENOUS | Status: DC | PRN
  Administered 2021-04-17 (×3): 120 ug via INTRAVENOUS

## 2021-04-17 MED ORDER — SODIUM CHLORIDE 0.9 % IV SOLN: Freq: Once | INTRAVENOUS | Status: DC

## 2021-04-17 MED ORDER — FENTANYL CITRATE (PF) 100 MCG/2ML IJ SOLN
INTRAMUSCULAR | Status: AC
  Filled 2021-04-17: qty 2

## 2021-04-17 MED ORDER — ONDANSETRON HCL 4 MG/2ML IJ SOLN
INTRAMUSCULAR | Status: DC | PRN
  Administered 2021-04-17: 4 mg via INTRAVENOUS

## 2021-04-17 MED ORDER — ESMOLOL HCL 100 MG/10ML IV SOLN
INTRAVENOUS | Status: DC | PRN
  Administered 2021-04-17: 20 mg via INTRAVENOUS
  Administered 2021-04-17: 40 mg via INTRAVENOUS
  Administered 2021-04-17: 20 mg via INTRAVENOUS

## 2021-04-17 MED ORDER — SODIUM CHLORIDE 0.9% IV SOLUTION: Freq: Once | INTRAVENOUS | Status: DC

## 2021-04-17 MED ORDER — LIDOCAINE 2% (20 MG/ML) 5 ML SYRINGE
INTRAMUSCULAR | Status: DC | PRN
  Administered 2021-04-17: 60 mg via INTRAVENOUS

## 2021-04-17 MED ORDER — IOHEXOL 240 MG/ML SOLN
50.0000 mL | Freq: Once | INTRAMUSCULAR | Status: AC | PRN
  Administered 2021-04-17: 20 mL via INTRAVENOUS

## 2021-04-17 MED ORDER — FUROSEMIDE 10 MG/ML IJ SOLN
20.0000 mg | Freq: Once | INTRAMUSCULAR | Status: AC
  Administered 2021-04-17: 20 mg via INTRAVENOUS
  Filled 2021-04-17: qty 2

## 2021-04-17 MED ORDER — SODIUM CHLORIDE 0.9 % IV SOLN
25.0000 ug/min | INTRAVENOUS | Status: DC
  Administered 2021-04-17: 40 ug/min via INTRAVENOUS

## 2021-04-17 MED ORDER — HYDROMORPHONE HCL 1 MG/ML IJ SOLN
INTRAMUSCULAR | Status: AC
  Filled 2021-04-17: qty 1

## 2021-04-17 MED ORDER — FENTANYL CITRATE (PF) 250 MCG/5ML IJ SOLN
INTRAMUSCULAR | Status: DC | PRN
  Administered 2021-04-17 (×2): 50 ug via INTRAVENOUS

## 2021-04-17 MED ORDER — ALBUMIN HUMAN 5 % IV SOLN
INTRAVENOUS | Status: AC
  Filled 2021-04-17: qty 250

## 2021-04-17 MED ORDER — NOREPINEPHRINE 4 MG/250ML-% IV SOLN
0.0000 ug/min | INTRAVENOUS | Status: DC
  Administered 2021-04-18: 5 ug/min via INTRAVENOUS
  Filled 2021-04-17: qty 250

## 2021-04-17 MED ORDER — METOPROLOL TARTRATE 5 MG/5ML IV SOLN
INTRAVENOUS | Status: AC
  Administered 2021-04-17: 2 mg via INTRAVENOUS
  Filled 2021-04-17: qty 5

## 2021-04-17 MED ORDER — PANTOPRAZOLE SODIUM 40 MG IV SOLR
40.0000 mg | Freq: Two times a day (BID) | INTRAVENOUS | Status: DC
  Administered 2021-04-17 – 2021-04-22 (×11): 40 mg via INTRAVENOUS
  Filled 2021-04-17 (×12): qty 40

## 2021-04-17 MED ORDER — SUGAMMADEX SODIUM 200 MG/2ML IV SOLN
INTRAVENOUS | Status: DC | PRN
  Administered 2021-04-17: 200 mg via INTRAVENOUS

## 2021-04-17 MED ORDER — OXYCODONE HCL 5 MG PO TABS: 5.0000 mg | ORAL_TABLET | Freq: Once | ORAL | Status: DC | PRN

## 2021-04-17 MED ORDER — IOHEXOL 240 MG/ML SOLN
50.0000 mL | Freq: Once | INTRAMUSCULAR | Status: AC | PRN
  Administered 2021-04-17: 40 mL via INTRA_ARTERIAL

## 2021-04-17 MED ORDER — ACETAMINOPHEN 325 MG PO TABS
650.0000 mg | ORAL_TABLET | Freq: Four times a day (QID) | ORAL | Status: AC
  Administered 2021-04-17 (×3): 650 mg via ORAL
  Filled 2021-04-17 (×3): qty 2

## 2021-04-17 MED ORDER — SODIUM CHLORIDE 0.9 % IV SOLN
1.0000 g | Freq: Once | INTRAVENOUS | Status: AC
  Administered 2021-04-18: 1 g via INTRAVENOUS
  Filled 2021-04-17 (×2): qty 10

## 2021-04-17 MED ORDER — SODIUM CHLORIDE 0.9 % IV SOLN: INTRAVENOUS | Status: DC | PRN

## 2021-04-17 MED ORDER — PHENYLEPHRINE HCL-NACL 20-0.9 MG/250ML-% IV SOLN
INTRAVENOUS | Status: DC | PRN
  Administered 2021-04-17: 25 ug/min via INTRAVENOUS

## 2021-04-17 MED ORDER — SUCCINYLCHOLINE 20MG/ML (10ML) SYRINGE FOR MEDFUSION PUMP - OPTIME
INTRAMUSCULAR | Status: DC | PRN
  Administered 2021-04-17: 160 mg via INTRAVENOUS

## 2021-04-17 MED ORDER — SODIUM CHLORIDE 0.9% IV SOLUTION: Freq: Once | INTRAVENOUS | Status: AC

## 2021-04-17 MED ORDER — CHLORHEXIDINE GLUCONATE CLOTH 2 % EX PADS
6.0000 | MEDICATED_PAD | Freq: Every day | CUTANEOUS | Status: DC
  Administered 2021-04-17 – 2021-04-21 (×5): 6 via TOPICAL

## 2021-04-17 MED ORDER — FENTANYL CITRATE (PF) 100 MCG/2ML IJ SOLN
25.0000 ug | INTRAMUSCULAR | Status: DC | PRN
  Administered 2021-04-17 (×3): 50 ug via INTRAVENOUS

## 2021-04-17 MED ORDER — OXYCODONE HCL 5 MG/5ML PO SOLN: 5.0000 mg | Freq: Once | ORAL | Status: DC | PRN

## 2021-04-17 MED ORDER — SODIUM CHLORIDE 0.9 % IV SOLN
1.0000 g | Freq: Once | INTRAVENOUS | Status: AC
  Administered 2021-04-18: 1 g via INTRAVENOUS
  Filled 2021-04-17: qty 10

## 2021-04-17 MED ORDER — ROCURONIUM BROMIDE 10 MG/ML (PF) SYRINGE
PREFILLED_SYRINGE | INTRAVENOUS | Status: DC | PRN
  Administered 2021-04-17: 50 mg via INTRAVENOUS

## 2021-04-17 MED ORDER — IOHEXOL 240 MG/ML SOLN: 50.0000 mL | Freq: Once | INTRAMUSCULAR | Status: DC | PRN

## 2021-04-17 MED ORDER — ALBUMIN HUMAN 5 % IV SOLN
12.5000 g | Freq: Once | INTRAVENOUS | Status: AC
  Administered 2021-04-17: 12.5 g via INTRAVENOUS

## 2021-04-17 MED ORDER — IOHEXOL 240 MG/ML SOLN
50.0000 mL | Freq: Once | INTRAMUSCULAR | Status: AC | PRN
  Administered 2021-04-17: 12 mL via INTRAVENOUS

## 2021-04-17 MED ORDER — LACTATED RINGERS IV SOLN: INTRAVENOUS | Status: DC | PRN

## 2021-04-17 MED ORDER — SODIUM CHLORIDE 0.9 % IV BOLUS
1000.0000 mL | Freq: Once | INTRAVENOUS | Status: AC
  Administered 2021-04-17: 1000 mL via INTRAVENOUS

## 2021-04-17 MED ORDER — SODIUM CHLORIDE 0.9 % IV BOLUS
500.0000 mL | Freq: Once | INTRAVENOUS | Status: AC
  Administered 2021-04-17: 500 mL via INTRAVENOUS

## 2021-04-17 MED ORDER — METOPROLOL TARTRATE 5 MG/5ML IV SOLN: 2.0000 mg | Freq: Once | INTRAVENOUS | Status: AC

## 2021-04-17 MED ORDER — FENTANYL CITRATE (PF) 100 MCG/2ML IJ SOLN
25.0000 ug | INTRAMUSCULAR | Status: DC | PRN
  Administered 2021-04-17 – 2021-04-20 (×12): 25 ug via INTRAVENOUS
  Filled 2021-04-17 (×12): qty 2

## 2021-04-17 MED ORDER — OXYCODONE HCL 5 MG PO TABS
5.0000 mg | ORAL_TABLET | Freq: Four times a day (QID) | ORAL | Status: DC | PRN
  Administered 2021-04-17 – 2021-04-20 (×7): 5 mg via ORAL
  Filled 2021-04-17 (×8): qty 1

## 2021-04-17 MED ORDER — PROPOFOL 10 MG/ML IV BOLUS
INTRAVENOUS | Status: DC | PRN
  Administered 2021-04-17: 100 mg via INTRAVENOUS

## 2021-04-17 NOTE — Progress Notes (Signed)
Dr. Pascal Lux received a call from Endoscopy for acute upper GI bleed seen during the endoscopy.   Patient to be transferred from Endo to IR with anesthesia team for mesenteric angiography with possible embolization.  Patient is currently sedated and intubated, anesthesia team managing sedation and airway.   Pt's son Mr. Elvena Jackovich was called to obtain emergent consent.  Risks and benefits of mesenteric angiogram with possible embolization were discussed with the patient including, but not limited to bleeding, infection, vascular injury or contrast induced renal failure.  All of the son's questions were answered, patient is agreeable to proceed.  Consent signed and in IR.  Armando Gang Severn Goddard PA-C 04/17/2021 4:15 PM

## 2021-04-17 NOTE — Progress Notes (Signed)
Received a page from hospitalist that they had received a consult for admission on this patient which follows with Dr. Andria Frames, one of our providers.  Hospitalist was informed us that they had not yet evaluated the patient and was checking to ensure that it was one of our patients.  I confirmed that this was one of our patients and informed the provider that since that not yet seen then we would take the admission.  Noted patient soft blood pressures.  We will go to evaluate the patient at this time.  H&P to follow.

## 2021-04-17 NOTE — H&P (Addendum)
[facility] Admission History and Physical Service Pager: [phone]  Patient name: [name] Medical record number: [id number] Date of birth: May 30, 1941 Age: 80 y.o. Gender: female  Primary Care Provider: Zenia Resides, MD Consultants: GI Code Status: Partial - no chest compressions, acls drugs and defib is okay, okay for intubation  Preferred Emergency Contact: Son - Corning     Name Relation Home Work [place] Son   [phone]        Chief Complaint: Rectal bleeding  Assessment and Plan: [name] is a 79 y.o. female presenting with rectal bleeding onset in the morning on 04/16/2021. PMH is significant for DM2, HTN, asthma, macular degeneration, and depression.   GI Bleed  Anemia  Patient reports of rectal bleeding that began in the morning on 9/6 that was described as "burgundy colored ." Several hours later she reports that she began to pass bright red blood and had one instance of copious bleeding but has since been a gradual slow leak of blood, and this caused her to go to the ED. Labs/imaging ordered in the ED are as follows: Potassium of 5.3, creatinine of 1.13 with GFR 50, troponin 26, white blood cell count 11.5, hemoglobin 7.4, FOBT+. CTA showed no sign of acute bleed. EKG is in sinus rhythm with LVH, Chest x-ray shows no active disease.  Patient had soft blood pressures in the ED in the 90s/40s to 50s. She has been ordered a total of 1 U PRBCs and two 500 mL boluses of NS to help maintain her pressures. Pt is also volume down on physical exam and is symptomatic. Pt does not have any known hx of heart failure. Overall, given patient's presentation GI bleed is likely cause.  Patient has elevated BUN to creatinine ratio that could suggest upper GI bleed. However, bright red blood per rectum is more indicative of a lower GI bleed. Pt has meloxicam as a home medication, but she stopped this one month ago.  She has  regularly been taking ibuprofen 400 mg intermittently until 2 weeks ago which could be the cause of her bleed.  In the ED the pt has received, protonix 80 mg, zofran, fentanyl 73mg x1, 1 U pRBCs and a 500 mL bolus of NS. They also ordered a urine culture and UA in ED.  The pt was admitted to a hospitalist on service, and he ordered 2 more units of blood in addition to another 500 mL bolus of NS due to the patient's soft pressures. Then later in the [facility]  -[facility]. HAndria Frames -Vital signs per unit routine  -Closely follow VS -2 large-bore IVs -Post transfusion H/H -Low threshold for transfer to ICU -Continue pulse ox and cardiac monitoring  -Morning CBC/CMP/HA1C -S/P 2 NS 500 mL boluses and 3 U pRBCs  Chest Pressure  ACS r/o  Pt reports of some chest pressure and SOB that is new from baseline. EKG: shows NSR and Trops have trended flat 23>26. Chest x-ray was unremarkable.  -No need to continue to trend trops Having continuous cardiac monitoring  Chronic Back Pain  Patient has a history of chronic lower back pain for which she has been taking Norco 1 tablet q4hours PRN. We cannot give the patient a narcotic at this time because of her soft pressures.  She can also not receive NSAIDs due to her GI bleed. -Tylenol '650mg'$  q6hrs -Can consider narcotic if needed once  patient's pressure improves  Elevated creatinine Creatinine on admission was 1.13 with a BUN of 48. -IVF fluids  -Monitor BMP  -Avoid nephrotoxic medications  -I&Os  DM2  Pt is on home med of ozempic '2mg'$  weekly. Last HA1C: 6.2 on 02/28/21 -Continue with sSSI -Regular CBG checks   Asthma  Pt is asymptomatic at this time. Home meds: Albuterol 2 puffs q4hr as needed  -Holding home inhaler as needed   HLD  Home med: Crestor 20 mg. Lipid Panel from 02/28/21: total cholesterol 158, triglycerides 108, HDL 72, LDL 67 -Continue with crestor 20 mg   Migraines  Pt was  taken off of Topamax due to itching that was caused by the medication. She was placed on trial of metoprolol '25mg'$ . Pt has no symptoms of oncoming migraine at this time.  -Holding metoprolol '25mg'$    RLL PNA, resolved  On chest x-ray on 04/05/21: improving RLL PNA. Pt was noted in August 2022 to have a significant cough and new sputum production. She was found to have a RLL PNA and was given doxycycline and cefdinir for treatment. She has completed her abx. Pt reports today that her sputum has improved greatly and chest xray shows no active disease. Pt is satting 96% on RA.  Adjustment Reaction with Depression  Pt has had a very difficult time since the death of her husband in 01/04/2021. Home med: Cymbalta '60mg'$  daily  -Holding home medication for now   Peripheral Neuropathy  Home med: Cymbalta '60mg'$  daily  -Holding home medication for now   FEN/GI: NPO for now Prophylaxis: SCDs  Disposition: Progressive  History of Present Illness:  Alyssabeth Bezy is a 80 y.o. female presenting with GI bleed that started yesterday morning. She states she was constipated lately from pain medication. Her bowel movements started out as burgundy but then became pure red in the afternoon which lead her to come to the ED. She reports that she had 1 instance of copious blood but has since only had a small amount pass.  Patient reports that she is dizzy and lightheaded and has difficulty standing.  Patient reports that she was taking ibuprofen most days 400 mg until 2 weeks ago.  She also was taking meloxicam regularly until about a month ago.  She does not take any blood thinners. She has some chest pressure and shortness of breath.   Her husband passed a couple of months ago, and this has been difficult for her.   Review Of Systems: Per HPI with the following additions:   Review of Systems  Constitutional:  Negative for fever.  HENT:  Positive for congestion.   Respiratory:  Positive for cough and shortness of breath.    Cardiovascular:  Positive for chest pain.  Gastrointestinal:  Positive for abdominal pain, constipation and nausea.  Musculoskeletal:  Positive for back pain.  Skin:  Positive for pallor.  Neurological:  Positive for dizziness.    Patient Active Problem List   Diagnosis Date Noted   GI bleed 04/17/2021   Acute GI bleeding 04/16/2021   Polypharmacy 12/31/2020   Asthma exacerbation 11/28/2020   Itching of ear, bilateral 05/11/2020   Financial difficulties 11/16/2019   Osteoarthritis of knee, unspecified 06/13/2019   Rotator cuff syndrome of right shoulder 01/21/2019   Bladder prolapse, female, acquired 08/16/2018   Urinary incontinence 07/05/2018   Frequent falls 12/24/2017   Adjustment reaction with anxiety and depression 0000000   Ruptured silicone breast implant 12/08/2016   Cough 03/03/2016   Iron deficiency  anemia due to chronic blood loss 10/31/2015   Chronic right-sided thoracic back pain 07/26/2015   Spinal stenosis of lumbar region 07/10/2015   Abdominal aortic atherosclerosis ([place]) 07/10/2015   UTI (urinary tract infection) 09/07/2013   Sleep apnea 05/22/2010   PERIPHERAL NEUROPATHY 02/06/2010   PULMONARY NODULE, SOLITARY 07/20/2009   Irritable bowel syndrome 11/12/2007   Non-insulin dependent type 2 diabetes mellitus ([place]) 10/08/2006   HYPERCHOLESTEROLEMIA 10/08/2006   HYPERTRIGLYCERIDEMIA 10/08/2006   Migraine headache 10/08/2006   CARPAL TUNNEL SYNDROME 10/08/2006   HYPERTENSION, BENIGN SYSTEMIC 10/08/2006   ASTHMA, PERSISTENT, MODERATE 10/08/2006   REFLUX ESOPHAGITIS 10/08/2006   Eczema 10/08/2006   Psoriasis 10/08/2006   Osteoarthritis of spine 10/08/2006   VERTIGO NOS OR DIZZINESS 10/08/2006    Past Medical History: Past Medical History:  Diagnosis Date   Asthma    Depression    Diabetes mellitus without complication ([place])    History of surgery on arm    right arm ( plate and screws)   Hypertension    Macular degeneration    Migraine     without aura   Pneumonia    Urinary incontinence     Past Surgical History: Past Surgical History:  Procedure Laterality Date   ABDOMINAL HYSTERECTOMY     ?Lt. ovary remains   APPENDECTOMY     Arm surgery     BREAST BIOPSY Right 10/10/2009   BREAST CYST ASPIRATION  12/20/2013   BREAST EXCISIONAL BIOPSY Left 1998   BREAST EXCISIONAL BIOPSY Right 1993    Social History: Social History   Tobacco Use   Smoking status: Former    Packs/day: 2.00    Years: 33.00    Pack years: 66.00    Types: Cigarettes    Start date: 08/11/1957    Quit date: 04/04/1991    Years since quitting: 30.0   Smokeless tobacco: Never  Vaping Use   Vaping Use: Never used  Substance Use Topics   Alcohol use: No   Drug use: No   Additional social history: N/A I think you will be okay Please also refer to relevant sections of EMR.  Family History: Family History  Problem Relation Age of Onset   Hypertension Mother    Hypertension Father    Hearing loss Sister    Heart attack Sister 26       s/p 4 bypasses    Stroke Brother        58   Breast cancer Maternal Aunt    Ovarian cancer Maternal Aunt      Allergies and Medications: Allergies  Allergen Reactions   Amoxicillin Rash   Azithromycin Rash    Itching/rash   Butalbital Other (See Comments)    Reaction: trouble breathing, but uncertain   Clindamycin Hcl Rash   Januvia [Sitagliptin] Other (See Comments)    Worsened migraines.   Levaquin [Levofloxacin In D5w] Diarrhea   Meperidine Hcl Other (See Comments)    REACTION: itching and hallucinations   Sulfamethoxazole Rash   Buspirone     Made urinary incontinence worse.     Ketorolac Swelling    Received ketorolac, methocarbamal, and lidocaine patch all at the same time.  Lip swelling 12 hours later   Lidocaine Swelling    Received ketorolac, methocarbamal, and lidocaine patch all at the same time.  Lip swelling 12 hours later   Macrodantin [Nitrofurantoin Macrocrystal] Itching    Methocarbamol Swelling    Received ketorolac, methocarbamal, and lidocaine patch all at the same time.  Lip swelling  12 hours later   Codeine Phosphate Other (See Comments)    REACTION: unknown   Etodolac Other (See Comments)    REACTION: stomach upset   Gabapentin Other (See Comments)    REACTION: mental clouding  Does not want to try again even in low dose.   Naproxen Other (See Comments)    REACTION: stomach upset   Pentazocine-Naloxone Hcl Other (See Comments)    REACTION: unspecified   Sumatriptan Other (See Comments)    REACTION: chest pain   Zonegran Other (See Comments)    Reaction: Unknown   No current facility-administered medications on file prior to encounter.   Current Outpatient Medications on File Prior to Encounter  Medication Sig Dispense Refill   acetic acid-hydrocortisone (VOSOL-HC) OTIC solution Place 3 drops into both ears 2 (two) times daily. 10 mL 3   albuterol (VENTOLIN HFA) 108 (90 Base) MCG/ACT inhaler INHALE 2 PUFFS INTO THE LUNGS EVERY 4 HOURS AS NEEDED FOR WHEEZING OR SHORTNESS OF BREATH (Patient taking differently: Inhale 2 puffs into the lungs every 4 (four) hours as needed for shortness of breath.) 6.7 g 3   Artificial Tear Ointment (DRY EYES OP) Apply 1 drop to eye 2 (two) times daily as needed (dry eyes).     baclofen (LIORESAL) 10 MG tablet Take 1 tablet (10 mg total) by mouth 3 (three) times daily as needed for muscle spasms. 90 tablet 1   benzonatate (TESSALON) 200 MG capsule Take 1 capsule (200 mg total) by mouth 2 (two) times daily as needed for cough. 40 capsule 3   cefdinir (OMNICEF) 300 MG capsule Take 1 capsule (300 mg total) by mouth 2 (two) times daily. (Patient taking differently: Take 300 mg by mouth See admin instructions. Bid x 10 days) 20 capsule 0   doxycycline (VIBRA-TABS) 100 MG tablet Take 1 tablet (100 mg total) by mouth 2 (two) times daily. (Patient taking differently: Take 100 mg by mouth See admin instructions. Bid x 10 days) 20  tablet 0   DULoxetine (CYMBALTA) 60 MG capsule Take 60 mg by mouth daily.     enalapril (VASOTEC) 10 MG tablet Take 1 tablet (10 mg total) by mouth daily. 90 tablet 0   guaiFENesin-dextromethorphan (ROBITUSSIN DM) 100-10 MG/5ML syrup Take 5 mLs by mouth every 4 (four) hours as needed for cough. 118 mL 0   HYDROcodone-acetaminophen (NORCO/VICODIN) 5-325 MG tablet Take 1 tablet by mouth every 4 (four) hours as needed. (Patient taking differently: Take 1 tablet by mouth every 4 (four) hours as needed for moderate pain.) 12 tablet 0   ibuprofen (ADVIL) 200 MG tablet Take 400 mg by mouth every 6 (six) hours as needed for headache or moderate pain.     Lancets (ONETOUCH DELICA PLUS 123XX123) MISC USE   TO CHECK GLUCOSE IN THE MORNING AND AT BEDTIME (Patient taking differently: Check blood sugar once daily) 100 each 3   metoprolol succinate (TOPROL-XL) 25 MG 24 hr tablet Take 1 tablet (25 mg total) by mouth at bedtime. (Patient taking differently: Take 25 mg by mouth daily.) 30 tablet 3   Multiple Vitamin (MULTIVITAMIN WITH MINERALS) TABS tablet Take 1 tablet by mouth daily.     ondansetron (ZOFRAN) 4 MG tablet Take 1 tablet (4 mg total) by mouth every 8 (eight) hours as needed for nausea or vomiting. 20 tablet 4   ONETOUCH VERIO test strip USE  STRIP TO CHECK GLUCOSE TWICE DAILY AS DIRECTED (Patient taking differently: Check blood sugar once daily) 100 each 3  rosuvastatin (CRESTOR) 20 MG tablet TAKE 1 TABLET BY MOUTH ONCE DAILY **TAKES  THE  PLACE  OF  SIMVASTATIN** (Patient taking differently: Take 20 mg by mouth daily.) 90 tablet 3   Semaglutide, 1 MG/DOSE, (OZEMPIC, 1 MG/DOSE,) 2 MG/1.5ML SOPN Inject 2 mg into the skin once a week. (Patient taking differently: Inject 1 mg into the skin every Sunday.)     triamcinolone ointment (KENALOG) 0.5 % Apply 1 application topically 2 (two) times daily. For elbows (Patient taking differently: Apply 1 application topically 2 (two) times daily as needed (elbow  itching).) 30 g 2   UNKNOWN TO PATIENT "Vitamin for macular degeneration"     meloxicam (MOBIC) 7.5 MG tablet TAKE 1 TABLET(7.5 MG) BY MOUTH DAILY (Patient not taking: No sig reported) 30 tablet 2    Objective: BP (!) 87/48   Pulse (!) 112   Temp 97.7 F (36.5 C) (Oral)   Resp (!) 24   Ht '4\' 11"'$  (1.499 m)   Wt 76.2 kg   SpO2 96%   BMI 33.93 kg/m  Physical Exam Vitals reviewed.  Constitutional:      Comments: Ill appearing pale elderly woman  Cardiovascular:     Rate and Rhythm: Regular rhythm. Tachycardia present.     Heart sounds: No murmur heard. Pulmonary:     Effort: Pulmonary effort is normal. No respiratory distress.     Breath sounds: Normal breath sounds.  Abdominal:     General: There is no distension.     Palpations: Abdomen is soft.     Tenderness: There is abdominal tenderness (RLQ tenderness).     Comments: No acute peritoneal findings  Genitourinary:    Comments: Evidence of bleeding through diaper that patient is wearing  Skin:    General: Skin is dry.     Comments: Pale with dry mucous membranes  Psychiatric:        Mood and Affect: Mood normal.        Behavior: Behavior normal.     Labs and Imaging: CBC BMET  Recent Labs  Lab 04/16/21 1807  WBC 11.5*  HGB 7.4*  HCT 25.1*  PLT 236   Recent Labs  Lab 04/16/21 1807  NA 138  K 5.3*  CL 110  CO2 24  BUN 48*  CREATININE 1.13*  GLUCOSE 131*  CALCIUM 8.4*     EKG: My own interpretation NSR   Chest x-ray: No active disease   Erskine Emery, MD 04/17/2021, 2:10 AM PGY-1, [facility] pager: [phone], text pages welcome  Upper Level Addendum:  I have seen and evaluated this patient along with Dr. Zigmund Daniel and reviewed the above note, making necessary revisions as appropriate.  I agree with the medical decision making and physical exam as noted above.  Additionally, we continue to keep close monitoring on patient's labs.  I have reached out to ICU to [place] them  about the patient, see my progress note from tonight.  Low threshold to transfer her to the ICU if were unable to maintain vitals.  Per ICU they recommend given an additional 1 L fluid bolus and reaching back out to them if we are unable to maintain vitals with this.  Lurline Del, DO PGY-3 [facility]

## 2021-04-17 NOTE — H&P (View-Only) (Signed)
Reason for Consult:Blood in stools and anemia. Referring Physician: FPTS.  [name] is an 80 y.o. female.  HPI: [name] is a 80 year old white female with multiple medical problems listed below, presented to the [facility] emergency room with a history of rectal bleeding she was noted to have some burgundy stools in the morning yesterday which became bright red a few hours later with copious amount of bleeding noted thereafter.  She was seen and resuscitated in the emergency room with IV fluids and received 3 units of blood.  A CT angio done yesterday did not show any accumulation of contrast in the GI tract to suggest GI bleeding.  3 cm aneurysmal dilatation was noted in the infrarenal aorta at the site of a chronic short segment dissection flap with no acute dissection noted.  Moderate thoracoabdominal atherosclerosis also described.  Multiple degenerative disease in the spine were noted. Small tiny umbilical hernia containing fat was also described.  Patient claims she had been using some Aleve and meloxicam for back pain lately especially over the last couple of weeks. She had some right lower quadrant pain and mild epigastric pain recently.  Unfortunately after 3 units of packed red blood cells her hemoglobin is 9 7.9 g/dL and she receiving the fourth unit of PRBC's.  Past Medical History:  Diagnosis Date   Asthma    Depression    Diabetes mellitus without complication ([place])    History of surgery on arm    right arm ( plate and screws)   Hypertension    Macular degeneration    Migraine    without aura   Pneumonia    Urinary incontinence    Past Surgical History:  Procedure Laterality Date   ABDOMINAL HYSTERECTOMY     ?Lt. ovary remains   APPENDECTOMY     Arm surgery     BREAST BIOPSY Right 10/10/2009   BREAST CYST ASPIRATION  12/20/2013   BREAST EXCISIONAL BIOPSY Left 1998   BREAST EXCISIONAL BIOPSY Right 1993    Family History  Problem Relation Age of Onset   Hypertension  Mother    Hypertension Father    Hearing loss Sister    Heart attack Sister 66       s/p 4 bypasses    Stroke Brother        33   Breast cancer Maternal Aunt    Ovarian cancer Maternal Aunt    Social History:  reports that she quit smoking about 30 years ago. Her smoking use included cigarettes. She started smoking about 63 years ago. She has a 66.00 pack-year smoking history. She has never used smokeless tobacco. She reports that she does not drink alcohol and does not use drugs.  Allergies:  Allergies  Allergen Reactions   Amoxicillin Rash   Azithromycin Rash    Itching/rash   Butalbital Other (See Comments)    Reaction: trouble breathing, but uncertain   Clindamycin Hcl Rash   Januvia [Sitagliptin] Other (See Comments)    Worsened migraines.   Levaquin [Levofloxacin In D5w] Diarrhea   Meperidine Hcl Other (See Comments)    REACTION: itching and hallucinations   Sulfamethoxazole Rash   Buspirone     Made urinary incontinence worse.     Ketorolac Swelling    Received ketorolac, methocarbamal, and lidocaine patch all at the same time.  Lip swelling 12 hours later   Lidocaine Swelling    Received ketorolac, methocarbamal, and lidocaine patch all at the same time.  Lip swelling 12 hours  later   Macrodantin [Nitrofurantoin Macrocrystal] Itching   Methocarbamol Swelling    Received ketorolac, methocarbamal, and lidocaine patch all at the same time.  Lip swelling 12 hours later   Codeine Phosphate Other (See Comments)    REACTION: unknown   Etodolac Other (See Comments)    REACTION: stomach upset   Gabapentin Other (See Comments)    REACTION: mental clouding  Does not want to try again even in low dose.   Naproxen Other (See Comments)    REACTION: stomach upset   Pentazocine-Naloxone Hcl Other (See Comments)    REACTION: unspecified   Sumatriptan Other (See Comments)    REACTION: chest pain   Zonegran Other (See Comments)    Reaction: Unknown   Medications: I have  reviewed the patient's current medications. Prior to Admission:  Medications Prior to Admission  Medication Sig Dispense Refill Last Dose   acetic acid-hydrocortisone (VOSOL-HC) OTIC solution Place 3 drops into both ears 2 (two) times daily. 10 mL 3 Past Week   albuterol (VENTOLIN HFA) 108 (90 Base) MCG/ACT inhaler INHALE 2 PUFFS INTO THE LUNGS EVERY 4 HOURS AS NEEDED FOR WHEEZING OR SHORTNESS OF BREATH (Patient taking differently: Inhale 2 puffs into the lungs every 4 (four) hours as needed for shortness of breath.) 6.7 g 3 unk   Artificial Tear Ointment (DRY EYES OP) Apply 1 drop to eye 2 (two) times daily as needed (dry eyes).   Past Month   baclofen (LIORESAL) 10 MG tablet Take 1 tablet (10 mg total) by mouth 3 (three) times daily as needed for muscle spasms. 90 tablet 1 Past Month   benzonatate (TESSALON) 200 MG capsule Take 1 capsule (200 mg total) by mouth 2 (two) times daily as needed for cough. 40 capsule 3 Past Month   cefdinir (OMNICEF) 300 MG capsule Take 1 capsule (300 mg total) by mouth 2 (two) times daily. (Patient taking differently: Take 300 mg by mouth See admin instructions. Bid x 10 days) 20 capsule 0 04/16/2021   doxycycline (VIBRA-TABS) 100 MG tablet Take 1 tablet (100 mg total) by mouth 2 (two) times daily. (Patient taking differently: Take 100 mg by mouth See admin instructions. Bid x 10 days) 20 tablet 0 04/16/2021   DULoxetine (CYMBALTA) 60 MG capsule Take 60 mg by mouth daily.   04/16/2021   enalapril (VASOTEC) 10 MG tablet Take 1 tablet (10 mg total) by mouth daily. 90 tablet 0 04/16/2021   guaiFENesin-dextromethorphan (ROBITUSSIN DM) 100-10 MG/5ML syrup Take 5 mLs by mouth every 4 (four) hours as needed for cough. 118 mL 0 Past Month   HYDROcodone-acetaminophen (NORCO/VICODIN) 5-325 MG tablet Take 1 tablet by mouth every 4 (four) hours as needed. (Patient taking differently: Take 1 tablet by mouth every 4 (four) hours as needed for moderate pain.) 12 tablet 0 04/16/2021   ibuprofen  (ADVIL) 200 MG tablet Take 400 mg by mouth every 6 (six) hours as needed for headache or moderate pain.   unk   Lancets (ONETOUCH DELICA PLUS 123XX123) MISC USE   TO CHECK GLUCOSE IN THE MORNING AND AT BEDTIME (Patient taking differently: Check blood sugar once daily) 100 each 3    metoprolol succinate (TOPROL-XL) 25 MG 24 hr tablet Take 1 tablet (25 mg total) by mouth at bedtime. (Patient taking differently: Take 25 mg by mouth daily.) 30 tablet 3 04/16/2021 at 0930   Multiple Vitamin (MULTIVITAMIN WITH MINERALS) TABS tablet Take 1 tablet by mouth daily.   04/16/2021   ondansetron (ZOFRAN) 4 MG  tablet Take 1 tablet (4 mg total) by mouth every 8 (eight) hours as needed for nausea or vomiting. 20 tablet 4 04/16/2021   ONETOUCH VERIO test strip USE  STRIP TO CHECK GLUCOSE TWICE DAILY AS DIRECTED (Patient taking differently: Check blood sugar once daily) 100 each 3    rosuvastatin (CRESTOR) 20 MG tablet TAKE 1 TABLET BY MOUTH ONCE DAILY **TAKES  THE  PLACE  OF  SIMVASTATIN** (Patient taking differently: Take 20 mg by mouth daily.) 90 tablet 3 04/16/2021   Semaglutide, 1 MG/DOSE, (OZEMPIC, 1 MG/DOSE,) 2 MG/1.5ML SOPN Inject 2 mg into the skin once a week. (Patient taking differently: Inject 1 mg into the skin every Sunday.)   04/07/2021   triamcinolone ointment (KENALOG) 0.5 % Apply 1 application topically 2 (two) times daily. For elbows (Patient taking differently: Apply 1 application topically 2 (two) times daily as needed (elbow itching).) 30 g 2 unk   UNKNOWN TO PATIENT "Vitamin for macular degeneration"   04/16/2021   meloxicam (MOBIC) 7.5 MG tablet TAKE 1 TABLET(7.5 MG) BY MOUTH DAILY (Patient not taking: No sig reported) 30 tablet 2 Not Taking   Scheduled:  [MAR Hold] sodium chloride   Intravenous Once   [MAR Hold] acetaminophen  650 mg Oral Q6H   [MAR Hold] insulin aspart  0-9 Units Subcutaneous TID WC   [MAR Hold] pantoprazole (PROTONIX) IV  40 mg Intravenous Q12H   [MAR Hold] rosuvastatin  20 mg Oral  Daily   Results for orders placed or performed during the hospital encounter of 04/16/21 (from the past 48 hour(s))  Comprehensive metabolic panel     Status: Abnormal   Collection Time: 04/16/21  6:07 PM  Result Value Ref Range   Sodium 138 135 - 145 mmol/L   Potassium 5.3 (H) 3.5 - 5.1 mmol/L   Chloride 110 98 - 111 mmol/L   CO2 24 22 - 32 mmol/L   Glucose, Bld 131 (H) 70 - 99 mg/dL    Comment: Glucose reference range applies only to samples taken after fasting for at least 8 hours.   BUN 48 (H) 8 - 23 mg/dL   Creatinine, Ser 1.13 (H) 0.44 - 1.00 mg/dL   Calcium 8.4 (L) 8.9 - 10.3 mg/dL   Total Protein 4.7 (L) 6.5 - 8.1 g/dL   Albumin 2.7 (L) 3.5 - 5.0 g/dL   AST 30 15 - 41 U/L   ALT 19 0 - 44 U/L   Alkaline Phosphatase 50 38 - 126 U/L   Total Bilirubin 0.6 0.3 - 1.2 mg/dL   GFR, Estimated 50 (L) >60 mL/min    Comment: (NOTE) Calculated using the CKD-EPI Creatinine Equation (2021)    Anion gap 4 (L) 5 - 15    Comment: Performed at [facility], [place] [address], [place], [place] [address]  Lipase, blood     Status: None   Collection Time: 04/16/21  6:07 PM  Result Value Ref Range   Lipase 27 11 - 51 U/L    Comment: Performed at [facility] [address], [place], [place] [address]  CBC with Differential     Status: Abnormal   Collection Time: 04/16/21  6:07 PM  Result Value Ref Range   WBC 11.5 (H) 4.0 - 10.5 K/uL   RBC 2.72 (L) 3.87 - 5.11 MIL/uL   Hemoglobin 7.4 (L) 12.0 - 15.0 g/dL   HCT 25.1 (L) 36.0 - 46.0 %   MCV 92.3 80.0 - 100.0 fL   MCH  27.2 26.0 - 34.0 pg   MCHC 29.5 (L) 30.0 - 36.0 g/dL   RDW 16.1 (H) 11.5 - 15.5 %   Platelets 236 150 - 400 K/uL   nRBC 0.0 0.0 - 0.2 %   Neutrophils Relative % 79 %   Neutro Abs 9.0 (H) 1.7 - 7.7 K/uL   Lymphocytes Relative 13 %   Lymphs Abs 1.5 0.7 - 4.0 K/uL   Monocytes Relative 7 %   Monocytes Absolute 0.8 0.1 - 1.0 K/uL   Eosinophils Relative 0 %   Eosinophils Absolute 0.0 0.0 - 0.5 K/uL   Basophils  Relative 0 %   Basophils Absolute 0.1 0.0 - 0.1 K/uL   Immature Granulocytes 1 %   Abs Immature Granulocytes 0.06 0.00 - 0.07 K/uL    Comment: Performed at [facility] [address], [place], [place] [address]  Protime-INR     Status: Abnormal   Collection Time: 04/16/21  6:07 PM  Result Value Ref Range   Prothrombin Time 16.5 (H) 11.4 - 15.2 seconds   INR 1.3 (H) 0.8 - 1.2    Comment: (NOTE) INR goal varies based on device and disease states. Performed at [facility], [place] [address], [place], [place] [address]   Troponin I (High Sensitivity)     Status: Abnormal   Collection Time: 04/16/21  6:07 PM  Result Value Ref Range   Troponin I (High Sensitivity) 26 (H) <18 ng/L    Comment: (NOTE) Elevated high sensitivity troponin I (hsTnI) values and significant  changes across serial measurements may suggest ACS but many other  chronic and acute conditions are known to elevate hsTnI results.  Refer to the "Links" section for chest pain algorithms and additional  guidance. Performed at [facility], [place] [address] [place], [place] [address]   POC occult blood, ED     Status: Abnormal   Collection Time: 04/16/21  6:39 PM  Result Value Ref Range   Fecal Occult Bld POSITIVE (A) NEGATIVE  Resp Panel by RT-PCR (Flu A&B, Covid) Nasopharyngeal Swab     Status: None   Collection Time: 04/16/21  7:10 PM   Specimen: Nasopharyngeal Swab; Nasopharyngeal(NP) swabs in vial transport medium  Result Value Ref Range   SARS Coronavirus 2 by RT PCR NEGATIVE NEGATIVE    Comment: (NOTE) SARS-CoV-2 target nucleic acids are NOT DETECTED.  The SARS-CoV-2 RNA is generally detectable in upper respiratory specimens during the acute phase of infection. The lowest concentration of SARS-CoV-2 viral copies this assay can detect is 138 copies/mL. A negative result does not preclude SARS-Cov-2 infection and should not be used as the sole basis for treatment or other patient management  decisions. A negative result may occur with  improper specimen collection/handling, submission of specimen other than nasopharyngeal swab, presence of viral mutation(s) within the areas targeted by this assay, and inadequate number of viral copies(<138 copies/mL). A negative result must be combined with clinical observations, patient history, and epidemiological information. The expected result is Negative.  Fact Sheet for Patients:  EntrepreneurPulse.com.au  Fact Sheet for Healthcare Providers:  IncredibleEmployment.be  This test is no t yet approved or cleared by the [place] FDA and  has been authorized for detection and/or diagnosis of SARS-CoV-2 by FDA under an Emergency Use Authorization (EUA). This EUA will remain  in effect (meaning this test can be used) for the duration of the COVID-19 declaration under Section 564(b)(1) of the Act, 21 U.S.C.section 360bbb-3(b)(1), unless the authorization  is terminated  or revoked sooner.       Influenza A by PCR NEGATIVE NEGATIVE   Influenza B by PCR NEGATIVE NEGATIVE    Comment: (NOTE) The Xpert Xpress SARS-CoV-2/FLU/RSV plus assay is intended as an aid in the diagnosis of influenza from Nasopharyngeal swab specimens and should not be used as a sole basis for treatment. Nasal washings and aspirates are unacceptable for Xpert Xpress SARS-CoV-2/FLU/RSV testing.  Fact Sheet for Patients: EntrepreneurPulse.com.au  Fact Sheet for Healthcare Providers: IncredibleEmployment.be  This test is not yet approved or cleared by the [place] FDA and has been authorized for detection and/or diagnosis of SARS-CoV-2 by FDA under an Emergency Use Authorization (EUA). This EUA will remain in effect (meaning this test can be used) for the duration of the COVID-19 declaration under Section 564(b)(1) of the Act, 21 U.S.C. section 360bbb-3(b)(1), unless the authorization  is terminated or revoked.  Performed at [facility], [place] [address], [place], [place] [address]   Prepare RBC (crossmatch)     Status: None   Collection Time: 04/16/21  7:26 PM  Result Value Ref Range   Order Confirmation      ORDER PROCESSED BY BLOOD BANK Performed at [facility], [place] [address], [place], [place] [address]   ABO/Rh     Status: None   Collection Time: 04/16/21  7:40 PM  Result Value Ref Range   ABO/RH(D)      O POS Performed at [facility] [address] [place], [place] [address]   Type and screen Ordered by PROVIDER DEFAULT     Status: None (Preliminary result)   Collection Time: 04/16/21  8:29 PM  Result Value Ref Range   ABO/RH(D) O POS    Antibody Screen NEG    Sample Expiration 04/19/2021,2359    Unit Number [id number]    Blood Component Type RED CELLS,LR    Unit division 00    Status of Unit ISSUED,FINAL    Transfusion Status OK TO TRANSFUSE    Crossmatch Result Compatible    Unit Number [id number]    Blood Component Type RED CELLS,LR    Unit division 00    Status of Unit ISSUED    Transfusion Status OK TO TRANSFUSE    Crossmatch Result Compatible    Unit Number [id number]    Blood Component Type RED CELLS,LR    Unit division 00    Status of Unit ISSUED    Transfusion Status OK TO TRANSFUSE    Crossmatch Result Compatible    Unit Number [id number]    Blood Component Type RED CELLS,LR    Unit division 00    Status of Unit ISSUED    Transfusion Status OK TO TRANSFUSE    Crossmatch Result      Compatible Performed at [facility], [place] [address] [place], [place] [address]    Unit Number [id number]    Blood Component Type RED CELLS,LR    Unit division 00    Status of Unit ALLOCATED    Transfusion Status OK TO TRANSFUSE    Crossmatch Result Compatible   Troponin I (High Sensitivity)     Status: Abnormal   Collection Time: 04/16/21 11:00 PM  Result Value Ref Range   Troponin I (High  Sensitivity) 23 (H) <18 ng/L    Comment: (NOTE) Elevated high sensitivity troponin I (hsTnI) values and significant  changes across serial measurements may suggest ACS but many other  chronic and acute conditions  are known to elevate hsTnI results.  Refer to the "Links" section for chest pain algorithms and additional  guidance. Performed at [facility], [place] [address], [place], [place] [address]   Prepare RBC (crossmatch)     Status: None   Collection Time: 04/17/21 12:18 AM  Result Value Ref Range   Order Confirmation      ORDER PROCESSED BY BLOOD BANK Performed at [facility], [place] [address], [place], [place] [address]   CBG monitoring, ED     Status: Abnormal   Collection Time: 04/17/21  7:46 AM  Result Value Ref Range   Glucose-Capillary 171 (H) 70 - 99 mg/dL    Comment: Glucose reference range applies only to samples taken after fasting for at least 8 hours.  Hemoglobin A1c     Status: None   Collection Time: 04/17/21  9:50 AM  Result Value Ref Range   Hgb A1c MFr Bld 5.6 4.8 - 5.6 %    Comment: (NOTE) Pre diabetes:          5.7%-6.4%  Diabetes:              >6.4%  Glycemic control for   <7.0% adults with diabetes    Mean Plasma Glucose 114.02 mg/dL    Comment: Performed at [facility] [address], [place], [place] [address]  Comprehensive metabolic panel     Status: Abnormal   Collection Time: 04/17/21  9:50 AM  Result Value Ref Range   Sodium 138 135 - 145 mmol/L   Potassium 5.2 (H) 3.5 - 5.1 mmol/L   Chloride 116 (H) 98 - 111 mmol/L   CO2 16 (L) 22 - 32 mmol/L   Glucose, Bld 182 (H) 70 - 99 mg/dL    Comment: Glucose reference range applies only to samples taken after fasting for at least 8 hours.   BUN 61 (H) 8 - 23 mg/dL   Creatinine, Ser 1.32 (H) 0.44 - 1.00 mg/dL   Calcium 7.2 (L) 8.9 - 10.3 mg/dL   Total Protein 3.0 (L) 6.5 - 8.1 g/dL   Albumin 1.7 (L) 3.5 - 5.0 g/dL   AST 78 (H) 15 - 41 U/L   ALT 57 (H) 0 - 44 U/L   Alkaline  Phosphatase 33 (L) 38 - 126 U/L   Total Bilirubin 0.5 0.3 - 1.2 mg/dL   GFR, Estimated 41 (L) >60 mL/min    Comment: (NOTE) Calculated using the CKD-EPI Creatinine Equation (2021)    Anion gap 6 5 - 15    Comment: Performed at [facility], [place] [address] [place], [place] [address]  CBC     Status: Abnormal   Collection Time: 04/17/21  9:50 AM  Result Value Ref Range   WBC 18.3 (H) 4.0 - 10.5 K/uL   RBC 2.56 (L) 3.87 - 5.11 MIL/uL   Hemoglobin 7.9 (L) 12.0 - 15.0 g/dL   HCT 23.8 (L) 36.0 - 46.0 %   MCV 93.0 80.0 - 100.0 fL   MCH 30.9 26.0 - 34.0 pg   MCHC 33.2 30.0 - 36.0 g/dL   RDW 14.5 11.5 - 15.5 %   Platelets 129 (L) 150 - 400 K/uL   nRBC 0.1 0.0 - 0.2 %    Comment: Performed at [facility], [place] [address], [place], [place] [address]  Prepare RBC (crossmatch)     Status: None   Collection Time: 04/17/21 10:56 AM  Result Value Ref Range  Order Confirmation      ORDER PROCESSED BY BLOOD BANK Performed at [facility], [place] [address], [place], [place] [address]   CBG monitoring, ED     Status: Abnormal   Collection Time: 04/17/21 11:16 AM  Result Value Ref Range   Glucose-Capillary 153 (H) 70 - 99 mg/dL    Comment: Glucose reference range applies only to samples taken after fasting for at least 8 hours.   DG Chest Portable 1 View  Result Date: 04/16/2021 CLINICAL DATA:  Chest pain EXAM: PORTABLE CHEST 1 VIEW COMPARISON:  04/13/2021. FINDINGS: Low lung volumes. Unchanged cardiac and mediastinal contours. No focal consolidative opacity. No pleural effusion or pneumothorax. No acute osseous abnormality. IMPRESSION: No active disease. Electronically Signed   By: Merilyn Baba M.D.   On: 04/16/2021 19:08   CT ANGIO GI BLEED  Result Date: 04/16/2021 CLINICAL DATA:  Left upper quadrant abdominal pain. Query diverticular bleed. EXAM: CTA ABDOMEN AND PELVIS WITHOUT AND WITH CONTRAST TECHNIQUE: Multidetector CT imaging of the abdomen and pelvis was performed using  the standard protocol during bolus administration of intravenous contrast. Multiplanar reconstructed images and MIPs were obtained and reviewed to evaluate the vascular anatomy. CONTRAST:  26m OMNIPAQUE IOHEXOL 350 MG/ML SOLN COMPARISON:  Abdominal CT 10/08/2015 FINDINGS: VASCULAR Aorta: Moderately advanced aortic atherosclerosis. 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 This is not significantly changed in appearance from noncontrast CT appearance of 2017. There is no periaortic stranding or evidence of acute dissection. Celiac: Patent without evidence of aneurysm, dissection, vasculitis or significant stenosis. Minimal plaque at the origin. SMA: Patent without evidence of aneurysm, dissection, vasculitis or significant stenosis. Minimal plaque at the origin. Renals: Single bilateral renal arteries. Plaque at the origin of the left greater than right renal artery but no severe stenosis. No dissection or acute findings. IMA: Patent at the origin with intermittent flow, diminutive in caliber. No acute findings. Inflow: Moderately calcified. No aneurysm, dissection, or acute findings. Proximal Outflow: Bilateral common femoral and visualized portions of the superficial and profunda femoral arteries are patent without evidence of aneurysm, dissection, vasculitis or significant stenosis. Veins: Portal venous phase imaging obtained demonstrating patency of the hepatic veins, IVC, and iliac system. The portal and superior mesenteric veins are patent. The splenic vein is patent. No acute venous findings. Review of the MIP images confirms the above findings. NON-VASCULAR Lower chest: Minimal subsegmental scarring in the left greater than right lower lobe. Hepatobiliary: Borderline hepatic steatosis. No focal hepatic abnormality. Gallbladder is displaced anteriorly but no calcified gallstone or inflammation. No biliary dilatation. Pancreas:  Unremarkable. No pancreatic ductal dilatation or surrounding inflammatory changes. Spleen: Normal in size without focal abnormality. Adrenals/Urinary Tract: Mild left adrenal thickening. No adrenal nodule. Mild symmetric perinephric edema which is nonspecific. No hydronephrosis. No renal calculi. Tiny hypodensities arising from the lower right kidney is too small to characterize but likely small cysts. Unremarkable urinary bladder. Stomach/Bowel: The stomach is distended with ingested material. Slightly high-density material in the stomach is present on both pre and post-contrast exam is not related to vasculature. There is no accumulation of contrast within the GI tract to localize GI bleed. High-density within a right colonic diverticulum is seen on precontrast imaging and not related to vascular extravasation. Occasional fluid-filled loops of small bowel without obstruction or inflammation. Appendectomy per history. The left colon is decompressed. There may be faint pericolonic stranding adjacent to the distal descending colon, for example  series 5, image 57. No diverticulitis. Lymphatic: No abdominopelvic adenopathy. Reproductive: Status post hysterectomy. No adnexal masses. Other: Tiny fat containing umbilical hernia. No free air, free fluid, or intra-abdominal fluid collection. Musculoskeletal: There are no acute or suspicious osseous abnormalities. Multilevel degenerative change in the lumbar spine. IMPRESSION: VASCULAR 1. No accumulation of contrast in the GI tract to suggest GI bleed. 2. 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 No acute dissection or aortic findings. Recommend follow-up every 3 years. This recommendation follows ACR consensus guidelines: White Paper of the ACR Incidental Findings Committee II on Vascular Findings. J Am Coll Radiol 2013; 10:789-794. 3. Moderate thoracoabdominal aortic atherosclerosis. NON-VASCULAR 1.  Questionable pericolonic fat stranding adjacent to the distal descending colon, may represent minimal colitis in the appropriate clinical setting. The left colon is decompressed and not well assessed. 2. Minimal colonic diverticulosis without focal diverticulitis. 3. Mild hepatic steatosis. Electronically Signed   By: Keith Rake M.D.   On: 04/16/2021 21:31    Review of Systems Blood pressure 105/70, pulse 88, temperature 98 F (36.7 C), temperature source Oral, resp. rate 18, height '4\' 11"'$  (1.499 m), weight 76.2 kg, SpO2 99 %. Physical Exam Vitals reviewed.  Constitutional:      General: She is in acute distress.     Appearance: She is not toxic-appearing or diaphoretic.  HENT:     Head: Normocephalic and atraumatic.     Mouth/Throat:     Mouth: Mucous membranes are dry.  Eyes:     Extraocular Movements: Extraocular movements intact.     Pupils: Pupils are equal, round, and reactive to light.  Cardiovascular:     Rate and Rhythm: Normal rate and regular rhythm.  Pulmonary:     Effort: Pulmonary effort is normal.     Breath sounds: Normal breath sounds.  Abdominal:     General: Bowel sounds are normal. There is no distension.     Palpations: Abdomen is soft.     Tenderness: There is no abdominal tenderness.  Musculoskeletal:     Cervical back: Normal range of motion and neck supple.  Skin:    General: Skin is warm and dry.  Neurological:     Mental Status: She is oriented to person, place, and time.  Psychiatric:        Mood and Affect: Mood normal.        Behavior: Behavior normal.        Thought Content: Thought content normal.        Judgment: Judgment normal.   Assessment/Plan: 1) Rectal bleeding with post-hemorrhagic anemia-CTA has been unrevealing.  We will proceed with an EGD with further recommendation made thereafter. 2) NIDDM/HTN/Hyperlipidemia. 3) Asthma. 4) Spinal stenosis/back pain/OA.  Juanita Craver 04/17/2021, 12:06 PM

## 2021-04-17 NOTE — Progress Notes (Addendum)
Received notification that the patient was having worsening vitals and passing more blood while in the PACU.  Notification requested [place] to speak with ICU.  We notified ICU of the situation and went to evaluate the patient.  Evaluated patient in PACU, she was awake and alert, tachycardic on physical exam.  Initial blood pressure 80s over upper 40s which subsequently improved on recheck.  Patient passed a moderate amount of dark red blood per rectum at this time.  Recent hemoglobin of 6.5 down from 10.2 earlier today.  She was tachycardic in the 120s.  ICU team arrived immediately after and evaluated the patient.   ICU team discussed plan to take the patient to the ICU for monitoring overnight with plan to return to our team at 0700 on 9/8.  Greatly appreciate the expertise and care of this patient by the ICU.

## 2021-04-17 NOTE — ED Notes (Signed)
Pt is wiggling around in bed and can not read an accurate pulse ox reading

## 2021-04-17 NOTE — Anesthesia Procedure Notes (Signed)
Arterial Line Insertion Start/End9/02/2021 4:00 PM, 04/17/2021 4:05 PM Performed by: Rande Brunt, CRNA, CRNA  Patient location: OOR procedure area. Left, radial was placed Catheter size: 20 G Hand hygiene performed   Attempts: 1 Procedure performed without using ultrasound guided technique. Following insertion, dressing applied and Biopatch. Patient tolerated the procedure well with no immediate complications.

## 2021-04-17 NOTE — Progress Notes (Signed)
[facility] Progress Note Patient Name: [name] DOB: 1941/06/25 MRN: [id number]   Date of Service  04/17/2021  HPI/Events of Note  Patient admitted to the medical service due to upper GI bleeding and acute blood loss anemia, she underwent IR embolization of the GDA but has continued to have a high transfusion requirement, and was therefore transferred to the ICU for closer monitoring.  eICU Interventions  New Patient Evaluation.        Kerry Kass Kinga Cassar 04/17/2021, 10:59 PM

## 2021-04-17 NOTE — Op Note (Signed)
[facility] Patient Name: [name] Procedure Date : 04/17/2021 MRN: [id number] Attending MD: Juanita Craver , MD Date of Birth: 1940/12/20 CSN: [id number] Age: 80 Admit Type: Inpatient Procedure:                Diagnostic EGD Indications:              Acute post hemorrhagic anemia, Hematochezia Providers:                Juanita Craver, MD, Dulcy Fanny, RN, Janee Morn, Technician, Junie Bame, CRNA,                            Oleta Mouse, MD, Doristine Johns, RN, Cherylynn Ridges, Technician Referring MD:             Jamal Collin. Hensel, MD Medicines:                Monitored Anesthesia Care Complications:            No immediate complications. Estimated Blood Loss:      Procedure:                Pre-Anesthesia Assessment: - Prior to the                            procedure, a history and physical was performed,                            and patient medications and allergies were                            reviewed. The patient's tolerance of previous                            anesthesia was also reviewed. The risks and                            benefits of the procedure and the sedation options                            and risks were discussed with the patient. All                            questions were answered, and informed consent was                            obtained. Prior Anticoagulants: The patient has                            taken no previous anticoagulant or antiplatelet                            agents  except for NSAID medication. ASA Grade                            Assessment: III - A patient with severe systemic                            disease. After reviewing the risks and benefits,                            the patient was deemed in satisfactory condition to                            undergo the procedure. After obtaining informed                            consent, the  endoscope was passed under direct                            vision. Throughout the procedure, the patient's                            blood pressure, pulse, and oxygen saturations were                            monitored continuously. The [id number]-[id number] ([id number])                            Olympus endoscope was introduced through the mouth,                            and advanced to the duodenal bulb. The EGD was                            extremely difficult due to excessive bleeding.Marland Kitchen Scope In: Scope Out: Findings:      The examined esophagus and GEJ appeared widely patent and normal.      There was a large amount of fresh blood in the stomach.      The cardia and gastric fundus were normal on retroflexion.      Large clots were noted in the duodenal bulb. Impression:               - Normal appearing, widely patent esophagus and GEJ.                           - Large amount of red blood in the entire stomach.                           - Large blood clots in the duodenal bulb.                           - No specimens collected. Moderate Sedation:      MAC used. Recommendation:           - NPO today.                           -  Continue present medications.                           - Patient is being referred to IR for fiurther                            evaluation and treatment-case discussed with Dr.                            Pascal Lux. Procedure Code(s):        --- Professional ---                           [phone], Esophagogastroduodenoscopy, flexible,                            transoral; diagnostic, including collection of                            specimen(s) by brushing or washing, when performed                            (separate procedure) Diagnosis Code(s):        --- Professional ---                           K92.1, Melena (includes Hematochezia)                           K92.2, Gastrointestinal hemorrhage, unspecified                           D62, Acute posthemorrhagic  anemia CPT copyright 2019 [employer]. All rights reserved. The codes documented in this report are preliminary and upon coder review may  be revised to meet current compliance requirements. Juanita Craver, MD Juanita Craver, MD 04/17/2021 4:16:52 PM This report has been signed electronically. Number of Addenda: 0

## 2021-04-17 NOTE — Anesthesia Procedure Notes (Addendum)
Procedure Name: Intubation Date/Time: 04/17/2021 3:44 PM Performed by: Rande Brunt, CRNA Pre-anesthesia Checklist: Patient identified, Emergency Drugs available, Suction available and Patient being monitored Patient Re-evaluated:Patient Re-evaluated prior to induction Oxygen Delivery Method: Circle System Utilized Preoxygenation: Pre-oxygenation with 100% oxygen Induction Type: IV induction, Cricoid Pressure applied and Rapid sequence Laryngoscope Size: Milling and 2 Grade View: Grade I Tube type: Oral Tube size: 7.0 mm Number of attempts: 1 Airway Equipment and Method: Stylet and Oral airway Placement Confirmation: ETT inserted through vocal cords under direct vision, positive ETCO2 and breath sounds checked- equal and bilateral Secured at: 21 cm Tube secured with: Tape Dental Injury: Teeth and Oropharynx as per pre-operative assessment

## 2021-04-17 NOTE — Progress Notes (Signed)
Went to evaluate [name] alongside Dr. Zigmund Daniel.  She was resting comfortably in bed.  I spoke with the RN caring for her, he states that she still seems to be in pain (she had previously complained of back pain).  Currently with a systolic of [id number].  Her pressure has improved to the point that I think she could tolerate a narcotic medication and this may assist with providing a more clear picture on whether the heart rate is due to volume status versus pain or combination of the above.  She is currently completing the third unit of packed RBCs.  We will continue to monitor her vitals and await a post H&H.  Greatly appreciate the care of her RN and appreciate GIs recommendations when they evaluate her this morning.

## 2021-04-17 NOTE — Sedation Documentation (Signed)
Right femoral sheath removed. 6 Fr. exoseal deployed

## 2021-04-17 NOTE — Transfer of Care (Signed)
Immediate Anesthesia Transfer of Care Note  Patient: [name]  Procedure(s) Performed: ESOPHAGOGASTRODUODENOSCOPY (EGD) RADIOLOGY WITH ANESTHESIA  Patient Location: PACU  Anesthesia Type:General  Level of Consciousness: awake, alert  and oriented  Airway & Oxygen Therapy: Patient Spontanous Breathing and Patient connected to face mask oxygen  Post-op Assessment: Report given to RN and Post -op Vital signs reviewed and stable  Post vital signs: Reviewed and stable  Last Vitals:  Vitals Value Taken Time  BP 137/93 04/17/21 1821  Temp    Pulse 72 04/17/21 1833  Resp 20 04/17/21 1835  SpO2 89 % 04/17/21 1833  Vitals shown include unvalidated device data.  Last Pain:  Vitals:   04/17/21 1443  TempSrc: Axillary  PainSc: 0-No pain         Complications: No notable events documented.

## 2021-04-17 NOTE — ED Notes (Signed)
Pt cleaned, placed in new brief with new chucks, following black, bloody stools. RN estimated about 500cc of blood loss in stool.

## 2021-04-17 NOTE — ED Notes (Signed)
Paged Dr Vanessa [place] to update on status of Pt s/p 2 bolus's and 2 units of blood. No new orders at this time

## 2021-04-17 NOTE — Procedures (Signed)
Pre-procedure Diagnosis: Acute upper GI bleeding Post-procedure Diagnosis: Same  Post mesenteric arteriogram and embolization of the GDA for bleeding duodenal bleeding.  Complications: None Immediate EBL: None  Keep right leg straight for 4 hrs (until 2000).    Signed: Sandi Mariscal Pager: [phone] 04/17/2021, 5:50 PM

## 2021-04-17 NOTE — Consult Note (Signed)
Reason for Consult:Blood in stools and anemia. Referring Physician: FPTS.  [name] is an 80 y.o. female.  HPI: [name] is a 80 year old white female with multiple medical problems listed below, presented to the [facility] emergency room with a history of rectal bleeding she was noted to have some burgundy stools in the morning yesterday which became bright red a few hours later with copious amount of bleeding noted thereafter.  She was seen and resuscitated in the emergency room with IV fluids and received 3 units of blood.  A CT angio done yesterday did not show any accumulation of contrast in the GI tract to suggest GI bleeding.  3 cm aneurysmal dilatation was noted in the infrarenal aorta at the site of a chronic short segment dissection flap with no acute dissection noted.  Moderate thoracoabdominal atherosclerosis also described.  Multiple degenerative disease in the spine were noted. Small tiny umbilical hernia containing fat was also described.  Patient claims she had been using some Aleve and meloxicam for back pain lately especially over the last couple of weeks. She had some right lower quadrant pain and mild epigastric pain recently.  Unfortunately after 3 units of packed red blood cells her hemoglobin is 9 7.9 g/dL and she receiving the fourth unit of PRBC's.  Past Medical History:  Diagnosis Date   Asthma    Depression    Diabetes mellitus without complication ([place])    History of surgery on arm    right arm ( plate and screws)   Hypertension    Macular degeneration    Migraine    without aura   Pneumonia    Urinary incontinence    Past Surgical History:  Procedure Laterality Date   ABDOMINAL HYSTERECTOMY     ?Lt. ovary remains   APPENDECTOMY     Arm surgery     BREAST BIOPSY Right 10/10/2009   BREAST CYST ASPIRATION  12/20/2013   BREAST EXCISIONAL BIOPSY Left 1998   BREAST EXCISIONAL BIOPSY Right 1993    Family History  Problem Relation Age of Onset   Hypertension  Mother    Hypertension Father    Hearing loss Sister    Heart attack Sister 33       s/p 4 bypasses    Stroke Brother        25   Breast cancer Maternal Aunt    Ovarian cancer Maternal Aunt    Social History:  reports that she quit smoking about 30 years ago. Her smoking use included cigarettes. She started smoking about 63 years ago. She has a 66.00 pack-year smoking history. She has never used smokeless tobacco. She reports that she does not drink alcohol and does not use drugs.  Allergies:  Allergies  Allergen Reactions   Amoxicillin Rash   Azithromycin Rash    Itching/rash   Butalbital Other (See Comments)    Reaction: trouble breathing, but uncertain   Clindamycin Hcl Rash   Januvia [Sitagliptin] Other (See Comments)    Worsened migraines.   Levaquin [Levofloxacin In D5w] Diarrhea   Meperidine Hcl Other (See Comments)    REACTION: itching and hallucinations   Sulfamethoxazole Rash   Buspirone     Made urinary incontinence worse.     Ketorolac Swelling    Received ketorolac, methocarbamal, and lidocaine patch all at the same time.  Lip swelling 12 hours later   Lidocaine Swelling    Received ketorolac, methocarbamal, and lidocaine patch all at the same time.  Lip swelling 12 hours  later   Macrodantin [Nitrofurantoin Macrocrystal] Itching   Methocarbamol Swelling    Received ketorolac, methocarbamal, and lidocaine patch all at the same time.  Lip swelling 12 hours later   Codeine Phosphate Other (See Comments)    REACTION: unknown   Etodolac Other (See Comments)    REACTION: stomach upset   Gabapentin Other (See Comments)    REACTION: mental clouding  Does not want to try again even in low dose.   Naproxen Other (See Comments)    REACTION: stomach upset   Pentazocine-Naloxone Hcl Other (See Comments)    REACTION: unspecified   Sumatriptan Other (See Comments)    REACTION: chest pain   Zonegran Other (See Comments)    Reaction: Unknown   Medications: I have  reviewed the patient's current medications. Prior to Admission:  Medications Prior to Admission  Medication Sig Dispense Refill Last Dose   acetic acid-hydrocortisone (VOSOL-HC) OTIC solution Place 3 drops into both ears 2 (two) times daily. 10 mL 3 Past Week   albuterol (VENTOLIN HFA) 108 (90 Base) MCG/ACT inhaler INHALE 2 PUFFS INTO THE LUNGS EVERY 4 HOURS AS NEEDED FOR WHEEZING OR SHORTNESS OF BREATH (Patient taking differently: Inhale 2 puffs into the lungs every 4 (four) hours as needed for shortness of breath.) 6.7 g 3 unk   Artificial Tear Ointment (DRY EYES OP) Apply 1 drop to eye 2 (two) times daily as needed (dry eyes).   Past Month   baclofen (LIORESAL) 10 MG tablet Take 1 tablet (10 mg total) by mouth 3 (three) times daily as needed for muscle spasms. 90 tablet 1 Past Month   benzonatate (TESSALON) 200 MG capsule Take 1 capsule (200 mg total) by mouth 2 (two) times daily as needed for cough. 40 capsule 3 Past Month   cefdinir (OMNICEF) 300 MG capsule Take 1 capsule (300 mg total) by mouth 2 (two) times daily. (Patient taking differently: Take 300 mg by mouth See admin instructions. Bid x 10 days) 20 capsule 0 04/16/2021   doxycycline (VIBRA-TABS) 100 MG tablet Take 1 tablet (100 mg total) by mouth 2 (two) times daily. (Patient taking differently: Take 100 mg by mouth See admin instructions. Bid x 10 days) 20 tablet 0 04/16/2021   DULoxetine (CYMBALTA) 60 MG capsule Take 60 mg by mouth daily.   04/16/2021   enalapril (VASOTEC) 10 MG tablet Take 1 tablet (10 mg total) by mouth daily. 90 tablet 0 04/16/2021   guaiFENesin-dextromethorphan (ROBITUSSIN DM) 100-10 MG/5ML syrup Take 5 mLs by mouth every 4 (four) hours as needed for cough. 118 mL 0 Past Month   HYDROcodone-acetaminophen (NORCO/VICODIN) 5-325 MG tablet Take 1 tablet by mouth every 4 (four) hours as needed. (Patient taking differently: Take 1 tablet by mouth every 4 (four) hours as needed for moderate pain.) 12 tablet 0 04/16/2021   ibuprofen  (ADVIL) 200 MG tablet Take 400 mg by mouth every 6 (six) hours as needed for headache or moderate pain.   unk   Lancets (ONETOUCH DELICA PLUS 123XX123) MISC USE   TO CHECK GLUCOSE IN THE MORNING AND AT BEDTIME (Patient taking differently: Check blood sugar once daily) 100 each 3    metoprolol succinate (TOPROL-XL) 25 MG 24 hr tablet Take 1 tablet (25 mg total) by mouth at bedtime. (Patient taking differently: Take 25 mg by mouth daily.) 30 tablet 3 04/16/2021 at 0930   Multiple Vitamin (MULTIVITAMIN WITH MINERALS) TABS tablet Take 1 tablet by mouth daily.   04/16/2021   ondansetron (ZOFRAN) 4 MG  tablet Take 1 tablet (4 mg total) by mouth every 8 (eight) hours as needed for nausea or vomiting. 20 tablet 4 04/16/2021   ONETOUCH VERIO test strip USE  STRIP TO CHECK GLUCOSE TWICE DAILY AS DIRECTED (Patient taking differently: Check blood sugar once daily) 100 each 3    rosuvastatin (CRESTOR) 20 MG tablet TAKE 1 TABLET BY MOUTH ONCE DAILY **TAKES  THE  PLACE  OF  SIMVASTATIN** (Patient taking differently: Take 20 mg by mouth daily.) 90 tablet 3 04/16/2021   Semaglutide, 1 MG/DOSE, (OZEMPIC, 1 MG/DOSE,) 2 MG/1.5ML SOPN Inject 2 mg into the skin once a week. (Patient taking differently: Inject 1 mg into the skin every Sunday.)   04/07/2021   triamcinolone ointment (KENALOG) 0.5 % Apply 1 application topically 2 (two) times daily. For elbows (Patient taking differently: Apply 1 application topically 2 (two) times daily as needed (elbow itching).) 30 g 2 unk   UNKNOWN TO PATIENT "Vitamin for macular degeneration"   04/16/2021   meloxicam (MOBIC) 7.5 MG tablet TAKE 1 TABLET(7.5 MG) BY MOUTH DAILY (Patient not taking: No sig reported) 30 tablet 2 Not Taking   Scheduled:  [MAR Hold] sodium chloride   Intravenous Once   [MAR Hold] acetaminophen  650 mg Oral Q6H   [MAR Hold] insulin aspart  0-9 Units Subcutaneous TID WC   [MAR Hold] pantoprazole (PROTONIX) IV  40 mg Intravenous Q12H   [MAR Hold] rosuvastatin  20 mg Oral  Daily   Results for orders placed or performed during the hospital encounter of 04/16/21 (from the past 48 hour(s))  Comprehensive metabolic panel     Status: Abnormal   Collection Time: 04/16/21  6:07 PM  Result Value Ref Range   Sodium 138 135 - 145 mmol/L   Potassium 5.3 (H) 3.5 - 5.1 mmol/L   Chloride 110 98 - 111 mmol/L   CO2 24 22 - 32 mmol/L   Glucose, Bld 131 (H) 70 - 99 mg/dL    Comment: Glucose reference range applies only to samples taken after fasting for at least 8 hours.   BUN 48 (H) 8 - 23 mg/dL   Creatinine, Ser 1.13 (H) 0.44 - 1.00 mg/dL   Calcium 8.4 (L) 8.9 - 10.3 mg/dL   Total Protein 4.7 (L) 6.5 - 8.1 g/dL   Albumin 2.7 (L) 3.5 - 5.0 g/dL   AST 30 15 - 41 U/L   ALT 19 0 - 44 U/L   Alkaline Phosphatase 50 38 - 126 U/L   Total Bilirubin 0.6 0.3 - 1.2 mg/dL   GFR, Estimated 50 (L) >60 mL/min    Comment: (NOTE) Calculated using the CKD-EPI Creatinine Equation (2021)    Anion gap 4 (L) 5 - 15    Comment: Performed at [facility], [place] [address], [place], [place] [address]  Lipase, blood     Status: None   Collection Time: 04/16/21  6:07 PM  Result Value Ref Range   Lipase 27 11 - 51 U/L    Comment: Performed at [facility] [address], [place], [place] [address]  CBC with Differential     Status: Abnormal   Collection Time: 04/16/21  6:07 PM  Result Value Ref Range   WBC 11.5 (H) 4.0 - 10.5 K/uL   RBC 2.72 (L) 3.87 - 5.11 MIL/uL   Hemoglobin 7.4 (L) 12.0 - 15.0 g/dL   HCT 25.1 (L) 36.0 - 46.0 %   MCV 92.3 80.0 - 100.0 fL   MCH  27.2 26.0 - 34.0 pg   MCHC 29.5 (L) 30.0 - 36.0 g/dL   RDW 16.1 (H) 11.5 - 15.5 %   Platelets 236 150 - 400 K/uL   nRBC 0.0 0.0 - 0.2 %   Neutrophils Relative % 79 %   Neutro Abs 9.0 (H) 1.7 - 7.7 K/uL   Lymphocytes Relative 13 %   Lymphs Abs 1.5 0.7 - 4.0 K/uL   Monocytes Relative 7 %   Monocytes Absolute 0.8 0.1 - 1.0 K/uL   Eosinophils Relative 0 %   Eosinophils Absolute 0.0 0.0 - 0.5 K/uL   Basophils  Relative 0 %   Basophils Absolute 0.1 0.0 - 0.1 K/uL   Immature Granulocytes 1 %   Abs Immature Granulocytes 0.06 0.00 - 0.07 K/uL    Comment: Performed at [facility] [address] [place], [place] [address]  Protime-INR     Status: Abnormal   Collection Time: 04/16/21  6:07 PM  Result Value Ref Range   Prothrombin Time 16.5 (H) 11.4 - 15.2 seconds   INR 1.3 (H) 0.8 - 1.2    Comment: (NOTE) INR goal varies based on device and disease states. Performed at [facility], [place] [address] [place], [place] [address]   Troponin I (High Sensitivity)     Status: Abnormal   Collection Time: 04/16/21  6:07 PM  Result Value Ref Range   Troponin I (High Sensitivity) 26 (H) <18 ng/L    Comment: (NOTE) Elevated high sensitivity troponin I (hsTnI) values and significant  changes across serial measurements may suggest ACS but many other  chronic and acute conditions are known to elevate hsTnI results.  Refer to the "Links" section for chest pain algorithms and additional  guidance. Performed at [facility], [place] [address] [place], [place] [address]   POC occult blood, ED     Status: Abnormal   Collection Time: 04/16/21  6:39 PM  Result Value Ref Range   Fecal Occult Bld POSITIVE (A) NEGATIVE  Resp Panel by RT-PCR (Flu A&B, Covid) Nasopharyngeal Swab     Status: None   Collection Time: 04/16/21  7:10 PM   Specimen: Nasopharyngeal Swab; Nasopharyngeal(NP) swabs in vial transport medium  Result Value Ref Range   SARS Coronavirus 2 by RT PCR NEGATIVE NEGATIVE    Comment: (NOTE) SARS-CoV-2 target nucleic acids are NOT DETECTED.  The SARS-CoV-2 RNA is generally detectable in upper respiratory specimens during the acute phase of infection. The lowest concentration of SARS-CoV-2 viral copies this assay can detect is 138 copies/mL. A negative result does not preclude SARS-Cov-2 infection and should not be used as the sole basis for treatment or other patient management  decisions. A negative result may occur with  improper specimen collection/handling, submission of specimen other than nasopharyngeal swab, presence of viral mutation(s) within the areas targeted by this assay, and inadequate number of viral copies(<138 copies/mL). A negative result must be combined with clinical observations, patient history, and epidemiological information. The expected result is Negative.  Fact Sheet for Patients:  EntrepreneurPulse.com.au  Fact Sheet for Healthcare Providers:  IncredibleEmployment.be  This test is no t yet approved or cleared by the [place] FDA and  has been authorized for detection and/or diagnosis of SARS-CoV-2 by FDA under an Emergency Use Authorization (EUA). This EUA will remain  in effect (meaning this test can be used) for the duration of the COVID-19 declaration under Section 564(b)(1) of the Act, 21 U.S.C.section 360bbb-3(b)(1), unless the authorization  is terminated  or revoked sooner.       Influenza A by PCR NEGATIVE NEGATIVE   Influenza B by PCR NEGATIVE NEGATIVE    Comment: (NOTE) The Xpert Xpress SARS-CoV-2/FLU/RSV plus assay is intended as an aid in the diagnosis of influenza from Nasopharyngeal swab specimens and should not be used as a sole basis for treatment. Nasal washings and aspirates are unacceptable for Xpert Xpress SARS-CoV-2/FLU/RSV testing.  Fact Sheet for Patients: EntrepreneurPulse.com.au  Fact Sheet for Healthcare Providers: IncredibleEmployment.be  This test is not yet approved or cleared by the [place] FDA and has been authorized for detection and/or diagnosis of SARS-CoV-2 by FDA under an Emergency Use Authorization (EUA). This EUA will remain in effect (meaning this test can be used) for the duration of the COVID-19 declaration under Section 564(b)(1) of the Act, 21 U.S.C. section 360bbb-3(b)(1), unless the authorization  is terminated or revoked.  Performed at [facility], [place] [address] [place], [place] [address]   Prepare RBC (crossmatch)     Status: None   Collection Time: 04/16/21  7:26 PM  Result Value Ref Range   Order Confirmation      ORDER PROCESSED BY BLOOD BANK Performed at [facility], [place] [address] [place], [place] [address]   ABO/Rh     Status: None   Collection Time: 04/16/21  7:40 PM  Result Value Ref Range   ABO/RH(D)      O POS Performed at [facility] [address] [place], [place] [address]   Type and screen Ordered by PROVIDER DEFAULT     Status: None (Preliminary result)   Collection Time: 04/16/21  8:29 PM  Result Value Ref Range   ABO/RH(D) O POS    Antibody Screen NEG    Sample Expiration 04/19/2021,2359    Unit Number [id number]    Blood Component Type RED CELLS,LR    Unit division 00    Status of Unit ISSUED,FINAL    Transfusion Status OK TO TRANSFUSE    Crossmatch Result Compatible    Unit Number [id number]    Blood Component Type RED CELLS,LR    Unit division 00    Status of Unit ISSUED    Transfusion Status OK TO TRANSFUSE    Crossmatch Result Compatible    Unit Number [id number]    Blood Component Type RED CELLS,LR    Unit division 00    Status of Unit ISSUED    Transfusion Status OK TO TRANSFUSE    Crossmatch Result Compatible    Unit Number [id number]    Blood Component Type RED CELLS,LR    Unit division 00    Status of Unit ISSUED    Transfusion Status OK TO TRANSFUSE    Crossmatch Result      Compatible Performed at [facility], [place] [address], [place], [place] [address]    Unit Number [id number]    Blood Component Type RED CELLS,LR    Unit division 00    Status of Unit ALLOCATED    Transfusion Status OK TO TRANSFUSE    Crossmatch Result Compatible   Troponin I (High Sensitivity)     Status: Abnormal   Collection Time: 04/16/21 11:00 PM  Result Value Ref Range   Troponin I (High  Sensitivity) 23 (H) <18 ng/L    Comment: (NOTE) Elevated high sensitivity troponin I (hsTnI) values and significant  changes across serial measurements may suggest ACS but many other  chronic and acute conditions  are known to elevate hsTnI results.  Refer to the "Links" section for chest pain algorithms and additional  guidance. Performed at [facility], [place] [address], [place], [place] [address]   Prepare RBC (crossmatch)     Status: None   Collection Time: 04/17/21 12:18 AM  Result Value Ref Range   Order Confirmation      ORDER PROCESSED BY BLOOD BANK Performed at [facility], [place] [address], [place], [place] [address]   CBG monitoring, ED     Status: Abnormal   Collection Time: 04/17/21  7:46 AM  Result Value Ref Range   Glucose-Capillary 171 (H) 70 - 99 mg/dL    Comment: Glucose reference range applies only to samples taken after fasting for at least 8 hours.  Hemoglobin A1c     Status: None   Collection Time: 04/17/21  9:50 AM  Result Value Ref Range   Hgb A1c MFr Bld 5.6 4.8 - 5.6 %    Comment: (NOTE) Pre diabetes:          5.7%-6.4%  Diabetes:              >6.4%  Glycemic control for   <7.0% adults with diabetes    Mean Plasma Glucose 114.02 mg/dL    Comment: Performed at [facility] [address] [place], [place] [address]  Comprehensive metabolic panel     Status: Abnormal   Collection Time: 04/17/21  9:50 AM  Result Value Ref Range   Sodium 138 135 - 145 mmol/L   Potassium 5.2 (H) 3.5 - 5.1 mmol/L   Chloride 116 (H) 98 - 111 mmol/L   CO2 16 (L) 22 - 32 mmol/L   Glucose, Bld 182 (H) 70 - 99 mg/dL    Comment: Glucose reference range applies only to samples taken after fasting for at least 8 hours.   BUN 61 (H) 8 - 23 mg/dL   Creatinine, Ser 1.32 (H) 0.44 - 1.00 mg/dL   Calcium 7.2 (L) 8.9 - 10.3 mg/dL   Total Protein 3.0 (L) 6.5 - 8.1 g/dL   Albumin 1.7 (L) 3.5 - 5.0 g/dL   AST 78 (H) 15 - 41 U/L   ALT 57 (H) 0 - 44 U/L   Alkaline  Phosphatase 33 (L) 38 - 126 U/L   Total Bilirubin 0.5 0.3 - 1.2 mg/dL   GFR, Estimated 41 (L) >60 mL/min    Comment: (NOTE) Calculated using the CKD-EPI Creatinine Equation (2021)    Anion gap 6 5 - 15    Comment: Performed at [facility], [place] [address], [place], [place] [address]  CBC     Status: Abnormal   Collection Time: 04/17/21  9:50 AM  Result Value Ref Range   WBC 18.3 (H) 4.0 - 10.5 K/uL   RBC 2.56 (L) 3.87 - 5.11 MIL/uL   Hemoglobin 7.9 (L) 12.0 - 15.0 g/dL   HCT 23.8 (L) 36.0 - 46.0 %   MCV 93.0 80.0 - 100.0 fL   MCH 30.9 26.0 - 34.0 pg   MCHC 33.2 30.0 - 36.0 g/dL   RDW 14.5 11.5 - 15.5 %   Platelets 129 (L) 150 - 400 K/uL   nRBC 0.1 0.0 - 0.2 %    Comment: Performed at [facility], [place] [address], [place], [place] [address]  Prepare RBC (crossmatch)     Status: None   Collection Time: 04/17/21 10:56 AM  Result Value Ref Range  Order Confirmation      ORDER PROCESSED BY BLOOD BANK Performed at [facility], [place] [address], [place], [place] [address]   CBG monitoring, ED     Status: Abnormal   Collection Time: 04/17/21 11:16 AM  Result Value Ref Range   Glucose-Capillary 153 (H) 70 - 99 mg/dL    Comment: Glucose reference range applies only to samples taken after fasting for at least 8 hours.   DG Chest Portable 1 View  Result Date: 04/16/2021 CLINICAL DATA:  Chest pain EXAM: PORTABLE CHEST 1 VIEW COMPARISON:  04/13/2021. FINDINGS: Low lung volumes. Unchanged cardiac and mediastinal contours. No focal consolidative opacity. No pleural effusion or pneumothorax. No acute osseous abnormality. IMPRESSION: No active disease. Electronically Signed   By: Merilyn Baba M.D.   On: 04/16/2021 19:08   CT ANGIO GI BLEED  Result Date: 04/16/2021 CLINICAL DATA:  Left upper quadrant abdominal pain. Query diverticular bleed. EXAM: CTA ABDOMEN AND PELVIS WITHOUT AND WITH CONTRAST TECHNIQUE: Multidetector CT imaging of the abdomen and pelvis was performed using  the standard protocol during bolus administration of intravenous contrast. Multiplanar reconstructed images and MIPs were obtained and reviewed to evaluate the vascular anatomy. CONTRAST:  59m OMNIPAQUE IOHEXOL 350 MG/ML SOLN COMPARISON:  Abdominal CT 10/08/2015 FINDINGS: VASCULAR Aorta: Moderately advanced aortic atherosclerosis. 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 This is not significantly changed in appearance from noncontrast CT appearance of 2017. There is no periaortic stranding or evidence of acute dissection. Celiac: Patent without evidence of aneurysm, dissection, vasculitis or significant stenosis. Minimal plaque at the origin. SMA: Patent without evidence of aneurysm, dissection, vasculitis or significant stenosis. Minimal plaque at the origin. Renals: Single bilateral renal arteries. Plaque at the origin of the left greater than right renal artery but no severe stenosis. No dissection or acute findings. IMA: Patent at the origin with intermittent flow, diminutive in caliber. No acute findings. Inflow: Moderately calcified. No aneurysm, dissection, or acute findings. Proximal Outflow: Bilateral common femoral and visualized portions of the superficial and profunda femoral arteries are patent without evidence of aneurysm, dissection, vasculitis or significant stenosis. Veins: Portal venous phase imaging obtained demonstrating patency of the hepatic veins, IVC, and iliac system. The portal and superior mesenteric veins are patent. The splenic vein is patent. No acute venous findings. Review of the MIP images confirms the above findings. NON-VASCULAR Lower chest: Minimal subsegmental scarring in the left greater than right lower lobe. Hepatobiliary: Borderline hepatic steatosis. No focal hepatic abnormality. Gallbladder is displaced anteriorly but no calcified gallstone or inflammation. No biliary dilatation. Pancreas:  Unremarkable. No pancreatic ductal dilatation or surrounding inflammatory changes. Spleen: Normal in size without focal abnormality. Adrenals/Urinary Tract: Mild left adrenal thickening. No adrenal nodule. Mild symmetric perinephric edema which is nonspecific. No hydronephrosis. No renal calculi. Tiny hypodensities arising from the lower right kidney is too small to characterize but likely small cysts. Unremarkable urinary bladder. Stomach/Bowel: The stomach is distended with ingested material. Slightly high-density material in the stomach is present on both pre and post-contrast exam is not related to vasculature. There is no accumulation of contrast within the GI tract to localize GI bleed. High-density within a right colonic diverticulum is seen on precontrast imaging and not related to vascular extravasation. Occasional fluid-filled loops of small bowel without obstruction or inflammation. Appendectomy per history. The left colon is decompressed. There may be faint pericolonic stranding adjacent to the distal descending colon, for example  series 5, image 57. No diverticulitis. Lymphatic: No abdominopelvic adenopathy. Reproductive: Status post hysterectomy. No adnexal masses. Other: Tiny fat containing umbilical hernia. No free air, free fluid, or intra-abdominal fluid collection. Musculoskeletal: There are no acute or suspicious osseous abnormalities. Multilevel degenerative change in the lumbar spine. IMPRESSION: VASCULAR 1. No accumulation of contrast in the GI tract to suggest GI bleed. 2. 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 No acute dissection or aortic findings. Recommend follow-up every 3 years. This recommendation follows ACR consensus guidelines: White Paper of the ACR Incidental Findings Committee II on Vascular Findings. J Am Coll Radiol 2013; 10:789-794. 3. Moderate thoracoabdominal aortic atherosclerosis. NON-VASCULAR 1.  Questionable pericolonic fat stranding adjacent to the distal descending colon, may represent minimal colitis in the appropriate clinical setting. The left colon is decompressed and not well assessed. 2. Minimal colonic diverticulosis without focal diverticulitis. 3. Mild hepatic steatosis. Electronically Signed   By: Keith Rake M.D.   On: 04/16/2021 21:31    Review of Systems Blood pressure 105/70, pulse 88, temperature 98 F (36.7 C), temperature source Oral, resp. rate 18, height '4\' 11"'$  (1.499 m), weight 76.2 kg, SpO2 99 %. Physical Exam Vitals reviewed.  Constitutional:      General: She is in acute distress.     Appearance: She is not toxic-appearing or diaphoretic.  HENT:     Head: Normocephalic and atraumatic.     Mouth/Throat:     Mouth: Mucous membranes are dry.  Eyes:     Extraocular Movements: Extraocular movements intact.     Pupils: Pupils are equal, round, and reactive to light.  Cardiovascular:     Rate and Rhythm: Normal rate and regular rhythm.  Pulmonary:     Effort: Pulmonary effort is normal.     Breath sounds: Normal breath sounds.  Abdominal:     General: Bowel sounds are normal. There is no distension.     Palpations: Abdomen is soft.     Tenderness: There is no abdominal tenderness.  Musculoskeletal:     Cervical back: Normal range of motion and neck supple.  Skin:    General: Skin is warm and dry.  Neurological:     Mental Status: She is oriented to person, place, and time.  Psychiatric:        Mood and Affect: Mood normal.        Behavior: Behavior normal.        Thought Content: Thought content normal.        Judgment: Judgment normal.   Assessment/Plan: 1) Rectal bleeding with post-hemorrhagic anemia-CTA has been unrevealing.  We will proceed with an EGD with further recommendation made thereafter. 2) NIDDM/HTN/Hyperlipidemia. 3) Asthma. 4) Spinal stenosis/back pain/OA.  Juanita Craver 04/17/2021, 12:06 PM

## 2021-04-17 NOTE — ED Notes (Signed)
MD at bedside. 

## 2021-04-17 NOTE — ED Notes (Addendum)
Pt cleaned up with full bed bath and bedding changed. Barrier cream was applied. Pt had a large amount of bloody stool.

## 2021-04-17 NOTE — Progress Notes (Signed)
Called Pt's son updated with current status and  transfer to ICU.   Otilio Carpen Cythina Mickelsen, PA-C

## 2021-04-17 NOTE — Sedation Documentation (Signed)
Pt with large bloody, slightly formed stool. Cleaned and sheets changed. Skin intact.

## 2021-04-17 NOTE — Progress Notes (Cosign Needed)
FPTS Interim Progress Note  S: Was called to bedside because patient's MAP was 62. Patient was alert and oriented to person place and situation. Patient feels better than previously.  She is still bleeding, and attests to having clots.  She has previously had an endoscopy for reflux many years ago as unsure about the results.  Patient says she has had colonoscopies in the past that were normal. Was given additional fluid bolus and MAPs were 67.    O: BP (!) 87/52   Pulse (!) 115   Temp 98 F (36.7 C) (Oral)   Resp (!) 21   Ht '4\' 11"'$  (1.499 m)   Wt 76.2 kg   SpO2 100%   BMI 33.93 kg/m   General: NAD, awake, alert, responsive to questions Head: Normocephalic atraumatic CV: Regular rate and rhythm no murmurs rubs or gallops Respiratory: Clear to ausculation bilaterally, chest rises symmetrically,  no increased work of breathing Abdomen: Soft, non-tender, non-distended, normoactive bowel sounds  Extremities: No edema in LE, lower extremity cool to touch but LE pulses present bilaterally, capillary refill in LE 3-4 seconds Neuro: No focal deficits A&O x3 Skin: No rashes or lesions visualized   A/P: GI bleed  anemia Patient continues to have rectal bleeding, and is wearing a diaper.  Son says it started yesterday morning. Patient feels better -Low threshold to call CCM for management if MAP less than 65, but continue monitoring and additional bolus if dropping MAPS -s/p 3 500 ml NS bolus and 3U RBC -consult to GI, appreciate assistance -f/u STAT post transfusion H&H   Chest pressure  EKG normal, trops trended flat.  -continuous cardiac monitoring  Chronic Back Pain -avoid NSAIDs -oxycodone 5 mg q6h prn -tylenol 650 mg q6h scheduled  Rest of plan per H&P  Gerrit Heck, MD 04/17/2021, 7:41 AM PGY-1, [facility] pager [phone]

## 2021-04-17 NOTE — ED Notes (Signed)
Help get patient cleaned up a new brief placed patient is now resting  with call bell in reach

## 2021-04-17 NOTE — Anesthesia Preprocedure Evaluation (Addendum)
Anesthesia Evaluation  Patient identified by MRN, date of birth, ID band Patient awake    Reviewed: Allergy & Precautions, NPO status , Patient's Chart, lab work & pertinent test results, reviewed documented beta blocker date and time   Airway Mallampati: II  TM Distance: >3 FB Neck ROM: Full    Dental no notable dental hx. (+) Teeth Intact, Dental Advisory Given   Pulmonary asthma , sleep apnea , former smoker,  Quit smoking 1992, 66 pack year history    Pulmonary exam normal breath sounds clear to auscultation       Cardiovascular hypertension, Pt. on home beta blockers and Pt. on medications Normal cardiovascular exam Rhythm:Regular Rate:Normal     Neuro/Psych  Headaches, PSYCHIATRIC DISORDERS Depression    GI/Hepatic Neg liver ROS, Rectal bleeding, anemia    Endo/Other  diabetesObesity BMI 34  Renal/GU Renal InsufficiencyRenal diseaseCr 1.32  negative genitourinary   Musculoskeletal  (+) Arthritis , Osteoarthritis,    Abdominal   Peds  Hematology  (+) Blood dyscrasia, anemia , 7.9/23.8, plt 129 Receiving blood   Anesthesia Other Findings   Reproductive/Obstetrics negative OB ROS                            Anesthesia Physical Anesthesia Plan  ASA: 4 and emergent  Anesthesia Plan: MAC   Post-op Pain Management:    Induction:   PONV Risk Score and Plan: 2 and Propofol infusion and TIVA  Airway Management Planned: Natural Airway and Simple Face Mask  Additional Equipment: None  Intra-op Plan:   Post-operative Plan:   Informed Consent: I have reviewed the patients History and Physical, chart, labs and discussed the procedure including the risks, benefits and alternatives for the proposed anesthesia with the patient or authorized representative who has indicated his/her understanding and acceptance.     Dental advisory given  Plan Discussed with:   Anesthesia Plan  Comments:        Anesthesia Quick Evaluation

## 2021-04-17 NOTE — ED Notes (Signed)
Cleaned patient up and replaced the patient brief and chux pad, as well as place a draw sheet on the bed. Tina-NT, assisted this EMT in the process.

## 2021-04-17 NOTE — Anesthesia Procedure Notes (Signed)
Central Venous Catheter Insertion Performed by: Oleta Mouse, MD, anesthesiologist Start/End9/02/2021 4:09 PM, 04/17/2021 4:16 PM Patient location: OR. Preanesthetic checklist: patient identified, IV checked, site marked, risks and benefits discussed, surgical consent, monitors and equipment checked, pre-op evaluation, timeout performed and anesthesia consent Position: supine Hand hygiene performed  and maximum sterile barriers used  Catheter size: 8 Fr Total catheter length 16. Central line was placed.Double lumen Procedure performed using ultrasound guided technique. Ultrasound Notes:anatomy identified, needle tip was noted to be adjacent to the nerve/plexus identified, no ultrasound evidence of intravascular and/or intraneural injection and image(s) printed for medical record Attempts: 1 Following insertion, dressing applied, line sutured and Biopatch. Post procedure assessment: blood return through all ports and free fluid flow  Patient tolerated the procedure well with no immediate complications.

## 2021-04-17 NOTE — Progress Notes (Addendum)
Reached out to the ICU to [place] them about the patient and the possibility of bringing her into the ICU if we are unable to maintain her vitals.  Greatly appreciate the ICU taking a look at the patient and providing recommendations.  ICU recommended continuing with fluid resuscitation at this time recommending giving an additional liter of fluid in addition to the pRBCs as the patient does not have a documented history of heart failure.  ICU further recommended to please let them know if we are unable to maintain appropriate blood pressures with this and they will be happy to reassess her.  Greatly appreciate ICU's assistance with this patient.  Additionally, per the patient's request I reached out to her son Juluis Rainier him about the patient.  The son stated he was aware of her being in the hospital and I explained the situation and the fact that with GI bleeds things can turn downhill quickly.  I explained that right now we are maintaining her vitals but she is close to the threshold where she might go into the ICU if we are unable to do so.  I further expressed that in situations with GI bleeds a patient can be stable and then suddenly go downhill if they are bleeding internally and we are unable to keep up with it from a fluid resuscitation standpoint.  I further answered his questions regarding admission and treatment plans.  Patient's son was appreciative of the call.

## 2021-04-17 NOTE — ED Notes (Signed)
[name] grandson [phone] would like an update on the patient

## 2021-04-17 NOTE — Consult Note (Signed)
NAME:  [name], MRN:  [id number], DOB:  [birth year], LOS: 0 ADMISSION DATE:  04/16/2021, CONSULTATION DATE:  04/17/21 REFERRING MD:  Vanessa Brady, CHIEF COMPLAINT:  GIB   History of Present Illness:  [name] is a 80 y.o. F with PMH of HTN, DM, macular degeneration, recent grief and chronic pain in the setting of the passing of her husband who presented to the ED  on 9/6 with complaints of bright red rectal bleeding with worsening back pain.  She is not on blood thinners or anti-platelet medications at baseline.  Had been taking NSAIDS for back pain.  Her initial Hgb was 7.4 and she was transfused, given IVF and protonix and and admitted to the family medicine service.    On 9/7 underwent EGD with findings of large amount of bright red blood in the entire stomach and was referred to IR.  She underwent post-mesenteric arteriogram and embolization of the GDA for duodenal bleeding.  Post-op continued to have dark red rectal bleeding and borderline low BP with Hgb down from 10.2 this afternoon to 6.5, therefore PCCM consulted.  Pt awake at the time of evaluation and continues to complain of primarily back pain. Has received 8 units PRBC's and 3 units FFP since admission with two more units PRBC's ordered   Pertinent  Medical History   has a past medical history of Asthma, Depression, Diabetes mellitus without complication ([place]), History of surgery on arm, Hypertension, Macular degeneration, Migraine, Pneumonia, and Urinary incontinence.   Significant Hospital Events: Including procedures, antibiotic start and stop dates in addition to other pertinent events   9/6 Presented with PRBPR and anemia, admitted to IMTS 9/7 EGD and IR embolization for  gastroduodenal artery bleed, continued maroon stools and borderline hypotension, to transfer to ICU  Interim History / Subjective:   Pt awake and oriented, noted repeat large BM of dark blood on evaluation,  MAP 65 without pressors  Objective   Blood  pressure (!) 102/47, pulse (!) 121, temperature 97.7 F (36.5 C), temperature source (P) Temporal, resp. rate 18, height '4\' 11"'$  (1.499 m), weight 76.2 kg, SpO2 95 %.        Intake/Output Summary (Last 24 hours) at 04/17/2021 2124 Last data filed at 04/17/2021 2106 Gross per 24 hour  Intake 9710.58 ml  Output 1915 ml  Net 7795.58 ml   Filed Weights   04/16/21 1754  Weight: 76.2 kg    General:  pale elderly F, awake, laying on stretcher in no acute distress HEENT: MM pink/moist Neuro: awake and oriented, conversational and moving all extremities CV: s1s2 tachycardic, no m/r/g PULM:  clear bilaterally on room air without tachypnea  GI: soft, bsx4 active, R flank/low back pain to palpation  Extremities: warm/dry, no edema  Skin: no rashes or lesions  Resolved Hospital Problem list     Assessment & Plan:    Acute blood loss anemia secondary to acute upper GI bleeding, possibly developing hemorrhagic shock Currently receiving 9th unit PRBC's, has received 3 units FFP, no platelets Received Albumin and 2.5L NS tonight as well Most recent Hgb 6.5, platelets 129 P: -Transfer to ICU for close monitoring, may need pressor support to maintain MAP >65 -PPI -receiving two units PRBC's (9th and 10th units) -give Calcium Chloride 2g -serial CBC, after 10 units PRBC's likely needs further transfusion in 1:1:1 MTP  ratio  -lactic acid pending -monitor mental and respiratory status closely -IR and GI are following    Acute Kidney Injury Hyperkalemia  Likely secondary  to hypoperfusion in the setting of GIB Creatinine 1.3 yesterday, improved to 1.0 tonight with BUN of 44 K 5.4 down from 6.4 earlier P: -continue to closely monitor renal function and electrolytes, has received 2.5L IVF -avoid nephrotoxic medication -monitor UOP  Leukocytosis  WBC 18k tonight, up-trending with TMAX 67F Possibly reactive, evidence of peri-colonic fat stranding on CT, consider developing colitis P: -UA  ordered yesterday, re-send -monitor fever curve, low threshold for empiric antibiotics   Type 2 DM -continue SSI    Depression, Neuropathy -home Cymbalta held  HL -continue statin   Hx of Asthma  -no exacerbation, continue albuterol      Best Practice (right click and "Reselect all SmartList Selections" daily)   Diet/type: NPO DVT prophylaxis: SCD GI prophylaxis: PPI Lines: Central line Foley:  Yes, and it is still needed Code Status:  full code Last date of multidisciplinary goals of care discussion '[]'$   Labs   CBC: Recent Labs  Lab 04/16/21 1807 04/17/21 0950 04/17/21 1623 04/17/21 1740 04/17/21 2038  WBC 11.5* 18.3*  --   --   --   NEUTROABS 9.0*  --   --   --   --   HGB 7.4* 7.9* 10.2* 7.5* 6.5*  HCT 25.1* 23.8* 30.0* 22.0* 19.0*  MCV 92.3 93.0  --   --   --   PLT 236 129*  --   --   --     Basic Metabolic Panel: Recent Labs  Lab 04/16/21 1807 04/17/21 0950 04/17/21 1623 04/17/21 1740 04/17/21 2038  NA 138 138 140 142 142  K 5.3* 5.2* 6.4* 5.6* 5.4*  CL 110 116*  --   --  112*  CO2 24 16*  --   --   --   GLUCOSE 131* 182*  --   --  175*  BUN 48* 61*  --   --  44*  CREATININE 1.13* 1.32*  --   --  1.00  CALCIUM 8.4* 7.2*  --   --   --    GFR: Estimated Creatinine Clearance: 41.3 mL/min (by C-G formula based on SCr of 1 mg/dL). Recent Labs  Lab 04/16/21 1807 04/17/21 0950  WBC 11.5* 18.3*    Liver Function Tests: Recent Labs  Lab 04/16/21 1807 04/17/21 0950  AST 30 78*  ALT 19 57*  ALKPHOS 50 33*  BILITOT 0.6 0.5  PROT 4.7* 3.0*  ALBUMIN 2.7* 1.7*   Recent Labs  Lab 04/16/21 1807  LIPASE 27   No results for input(s): AMMONIA in the last 168 hours.  ABG    Component Value Date/Time   PHART 7.354 04/17/2021 1740   PCO2ART 37.7 04/17/2021 1740   PO2ART 411 (H) 04/17/2021 1740   HCO3 21.5 04/17/2021 1740   TCO2 20 (L) 04/17/2021 2038   ACIDBASEDEF 4.0 (H) 04/17/2021 1740   O2SAT 100.0 04/17/2021 1740      Coagulation Profile: Recent Labs  Lab 04/16/21 1807  INR 1.3*    Cardiac Enzymes: No results for input(s): CKTOTAL, CKMB, CKMBINDEX, TROPONINI in the last 168 hours.  HbA1C: HbA1c, POC (controlled diabetic range)  Date/Time Value Ref Range Status  04/04/2021 05:02 PM 6.3 0.0 - 7.0 % Final  02/28/2021 12:25 PM 6.2 0.0 - 7.0 % Final   Hgb A1c MFr Bld  Date/Time Value Ref Range Status  04/17/2021 09:50 AM 5.6 4.8 - 5.6 % Final    Comment:    (NOTE) Pre diabetes:          5.7%-6.4%  Diabetes:              >6.4%  Glycemic control for   <7.0% adults with diabetes     CBG: Recent Labs  Lab 04/17/21 0746 04/17/21 1116  GLUCAP 171* 153*    Review of Systems:   Review of Systems  Constitutional:  Positive for chills. Negative for fever.  Cardiovascular:  Negative for chest pain.  Gastrointestinal:  Positive for blood in stool and constipation. Negative for nausea and vomiting.  Musculoskeletal:  Positive for back pain.    Past Medical History:  She,  has a past medical history of Asthma, Depression, Diabetes mellitus without complication ([place]), History of surgery on arm, Hypertension, Macular degeneration, Migraine, Pneumonia, and Urinary incontinence.   Surgical History:   Past Surgical History:  Procedure Laterality Date   ABDOMINAL HYSTERECTOMY     ?Lt. ovary remains   APPENDECTOMY     Arm surgery     BREAST BIOPSY Right 10/10/2009   BREAST CYST ASPIRATION  12/20/2013   BREAST EXCISIONAL BIOPSY Left 1998   BREAST EXCISIONAL BIOPSY Right 1993     Social History:   reports that she quit smoking about 30 years ago. Her smoking use included cigarettes. She started smoking about 63 years ago. She has a 66.00 pack-year smoking history. She has never used smokeless tobacco. She reports that she does not drink alcohol and does not use drugs.   Family History:  Her family history includes Breast cancer in her maternal aunt; Hearing loss in her sister; Heart  attack (age of onset: 39) in her sister; Hypertension in her father and mother; Ovarian cancer in her maternal aunt; Stroke in her brother.   Allergies Allergies  Allergen Reactions   Amoxicillin Rash   Azithromycin Rash    Itching/rash   Butalbital Other (See Comments)    Reaction: trouble breathing, but uncertain   Clindamycin Hcl Rash   Januvia [Sitagliptin] Other (See Comments)    Worsened migraines.   Levaquin [Levofloxacin In D5w] Diarrhea   Meperidine Hcl Other (See Comments)    REACTION: itching and hallucinations   Sulfamethoxazole Rash   Buspirone     Made urinary incontinence worse.     Ketorolac Swelling    Received ketorolac, methocarbamal, and lidocaine patch all at the same time.  Lip swelling 12 hours later   Lidocaine Swelling    Received ketorolac, methocarbamal, and lidocaine patch all at the same time.  Lip swelling 12 hours later   Macrodantin [Nitrofurantoin Macrocrystal] Itching   Methocarbamol Swelling    Received ketorolac, methocarbamal, and lidocaine patch all at the same time.  Lip swelling 12 hours later   Codeine Phosphate Other (See Comments)    REACTION: unknown   Etodolac Other (See Comments)    REACTION: stomach upset   Gabapentin Other (See Comments)    REACTION: mental clouding  Does not want to try again even in low dose.   Naproxen Other (See Comments)    REACTION: stomach upset   Pentazocine-Naloxone Hcl Other (See Comments)    REACTION: unspecified   Sumatriptan Other (See Comments)    REACTION: chest pain   Zonegran Other (See Comments)    Reaction: Unknown     Home Medications  Prior to Admission medications   Medication Sig Start Date End Date Taking? Authorizing Provider  acetic acid-hydrocortisone (VOSOL-HC) OTIC solution Place 3 drops into both ears 2 (two) times daily. 02/28/21  Yes Zenia Resides, MD  albuterol (VENTOLIN HFA) 108 (90  Base) MCG/ACT inhaler INHALE 2 PUFFS INTO THE LUNGS EVERY 4 HOURS AS NEEDED FOR WHEEZING  OR SHORTNESS OF BREATH Patient taking differently: Inhale 2 puffs into the lungs every 4 (four) hours as needed for shortness of breath. 12/11/20  Yes Hensel, Jamal Collin, MD  Artificial Tear Ointment (DRY EYES OP) Apply 1 drop to eye 2 (two) times daily as needed (dry eyes).   Yes [provider]  baclofen (LIORESAL) 10 MG tablet Take 1 tablet (10 mg total) by mouth 3 (three) times daily as needed for muscle spasms. 02/05/21  Yes Raulkar, Clide Deutscher, MD  benzonatate (TESSALON) 200 MG capsule Take 1 capsule (200 mg total) by mouth 2 (two) times daily as needed for cough. 04/04/21  Yes Gladys Damme, MD  cefdinir (OMNICEF) 300 MG capsule Take 1 capsule (300 mg total) by mouth 2 (two) times daily. Patient taking differently: Take 300 mg by mouth See admin instructions. Bid x 10 days 04/05/21  Yes Gladys Damme, MD  doxycycline (VIBRA-TABS) 100 MG tablet Take 1 tablet (100 mg total) by mouth 2 (two) times daily. Patient taking differently: Take 100 mg by mouth See admin instructions. Bid x 10 days 04/05/21  Yes Gladys Damme, MD  DULoxetine (CYMBALTA) 60 MG capsule Take 60 mg by mouth daily.   Yes [provider]  enalapril (VASOTEC) 10 MG tablet Take 1 tablet (10 mg total) by mouth daily. 03/05/21  Yes McDiarmid, Blane Ohara, MD  guaiFENesin-dextromethorphan (ROBITUSSIN DM) 100-10 MG/5ML syrup Take 5 mLs by mouth every 4 (four) hours as needed for cough. 04/04/21  Yes Raulkar, Clide Deutscher, MD  HYDROcodone-acetaminophen (NORCO/VICODIN) 5-325 MG tablet Take 1 tablet by mouth every 4 (four) hours as needed. Patient taking differently: Take 1 tablet by mouth every 4 (four) hours as needed for moderate pain. 04/14/21  Yes Pearson Forster, NP  ibuprofen (ADVIL) 200 MG tablet Take 400 mg by mouth every 6 (six) hours as needed for headache or moderate pain.   Yes [provider]  Lancets (ONETOUCH DELICA PLUS [id number]) [place] USE   TO CHECK GLUCOSE IN THE MORNING AND AT BEDTIME Patient taking  differently: Check blood sugar once daily 11/01/20  Yes Hensel, Jamal Collin, MD  metoprolol succinate (TOPROL-XL) 25 MG 24 hr tablet Take 1 tablet (25 mg total) by mouth at bedtime. Patient taking differently: Take 25 mg by mouth daily. 03/12/21  Yes Hensel, Jamal Collin, MD  Multiple Vitamin (MULTIVITAMIN WITH MINERALS) TABS tablet Take 1 tablet by mouth daily.   Yes [provider]  ondansetron (ZOFRAN) 4 MG tablet Take 1 tablet (4 mg total) by mouth every 8 (eight) hours as needed for nausea or vomiting. 02/28/21  Yes Hensel, Jamal Collin, MD  ONETOUCH VERIO test strip USE  STRIP TO CHECK GLUCOSE TWICE DAILY AS DIRECTED Patient taking differently: Check blood sugar once daily 11/01/20  Yes Hensel, Jamal Collin, MD  rosuvastatin (CRESTOR) 20 MG tablet TAKE 1 TABLET BY MOUTH ONCE DAILY **TAKES  THE  PLACE  OF  SIMVASTATIN** Patient taking differently: Take 20 mg by mouth daily. 01/16/21  Yes Hensel, Jamal Collin, MD  Semaglutide, 1 MG/DOSE, (OZEMPIC, 1 MG/DOSE,) 2 MG/1.5ML SOPN Inject 2 mg into the skin once a week. Patient taking differently: Inject 1 mg into the skin every Sunday. 01/14/21  Yes Hensel, Jamal Collin, MD  triamcinolone ointment (KENALOG) 0.5 % Apply 1 application topically 2 (two) times daily. For elbows Patient taking differently: Apply 1 application topically 2 (two) times daily as needed (  elbow itching). 10/10/19  Yes Hensel, Jamal Collin, MD  UNKNOWN TO PATIENT "Vitamin for macular degeneration"   Yes [provider]  meloxicam (MOBIC) 7.5 MG tablet TAKE 1 TABLET(7.5 MG) BY MOUTH DAILY Patient not taking: No sig reported 02/15/21   Raulkar, Clide Deutscher, MD     Critical care time:  50 minutes    CRITICAL CARE Performed by: Otilio Carpen Tan Clopper   Total critical care time: 50 minutes  Critical care time was exclusive of separately billable procedures and treating other patients.  Critical care was necessary to treat or prevent imminent or life-threatening deterioration.  Critical care was  time spent personally by me on the following activities: development of treatment plan with patient and/or surrogate as well as nursing, discussions with consultants, evaluation of patient's response to treatment, examination of patient, obtaining history from patient or surrogate, ordering and performing treatments and interventions, ordering and review of laboratory studies, ordering and review of radiographic studies, pulse oximetry and re-evaluation of patient's condition.  Otilio Carpen Daylan Boggess, PA-C [facility] See Amion for pager If no response to pager , please call [phone] until 7pm After 7:00 pm call Elink  [phone]?[place]

## 2021-04-18 ENCOUNTER — Ambulatory Visit: Payer: HMO

## 2021-04-18 ENCOUNTER — Encounter (HOSPITAL_COMMUNITY)
Admission: EM | Disposition: A | Discharge status: Skilled Nursing Facility | Payer: Self-pay | Source: Home / Self Care | Attending: Family Medicine

## 2021-04-18 ENCOUNTER — Encounter (HOSPITAL_COMMUNITY): Payer: Self-pay | Admitting: Certified Registered Nurse Anesthetist

## 2021-04-18 DIAGNOSIS — K922 Gastrointestinal hemorrhage, unspecified: Secondary | ICD-10-CM | POA: Diagnosis not present

## 2021-04-18 DIAGNOSIS — R578 Other shock: Secondary | ICD-10-CM | POA: Diagnosis not present

## 2021-04-18 DIAGNOSIS — D62 Acute posthemorrhagic anemia: Secondary | ICD-10-CM | POA: Diagnosis not present

## 2021-04-18 HISTORY — PX: ESOPHAGOGASTRODUODENOSCOPY: SHX5428

## 2021-04-18 HISTORY — PX: SUBMUCOSAL INJECTION: SHX5543

## 2021-04-18 HISTORY — PX: HOT HEMOSTASIS: SHX5433

## 2021-04-18 LAB — PREPARE FRESH FROZEN PLASMA
Unit division: 0
Unit division: 0
Unit division: 0

## 2021-04-18 LAB — CBC
HCT: 21.7 % — ABNORMAL LOW (ref 36.0–46.0)
HCT: 28.2 % — ABNORMAL LOW (ref 36.0–46.0)
Hemoglobin: 7.5 g/dL — ABNORMAL LOW (ref 12.0–15.0)
Hemoglobin: 9.8 g/dL — ABNORMAL LOW (ref 12.0–15.0)
MCH: 30.4 pg (ref 26.0–34.0)
MCH: 30.6 pg (ref 26.0–34.0)
MCHC: 34.6 g/dL (ref 30.0–36.0)
MCHC: 34.8 g/dL (ref 30.0–36.0)
MCV: 87.9 fL (ref 80.0–100.0)
MCV: 88.1 fL (ref 80.0–100.0)
Platelets: 53 10*3/uL — ABNORMAL LOW (ref 150–400)
Platelets: 97 10*3/uL — ABNORMAL LOW (ref 150–400)
RBC: 2.47 MIL/uL — ABNORMAL LOW (ref 3.87–5.11)
RBC: 3.2 MIL/uL — ABNORMAL LOW (ref 3.87–5.11)
RDW: 14.8 % (ref 11.5–15.5)
RDW: 15.3 % (ref 11.5–15.5)
WBC: 10.9 10*3/uL — ABNORMAL HIGH (ref 4.0–10.5)
WBC: 14.7 10*3/uL — ABNORMAL HIGH (ref 4.0–10.5)
nRBC: 0.2 % (ref 0.0–0.2)
nRBC: 0.4 % — ABNORMAL HIGH (ref 0.0–0.2)

## 2021-04-18 LAB — BPAM FFP
Blood Product Expiration Date: 202209072359
Blood Product Expiration Date: 202209072359
Blood Product Expiration Date: 202209072359
Blood Product Expiration Date: 202209072359
ISSUE DATE / TIME: 202209071616
ISSUE DATE / TIME: 202209071616
ISSUE DATE / TIME: 202209071616
ISSUE DATE / TIME: 202209071616
Unit Type and Rh: 600
Unit Type and Rh: 6200
Unit Type and Rh: 6200
Unit Type and Rh: 6200

## 2021-04-18 LAB — COMPREHENSIVE METABOLIC PANEL
ALT: 32 U/L (ref 0–44)
AST: 52 U/L — ABNORMAL HIGH (ref 15–41)
Albumin: 1.7 g/dL — ABNORMAL LOW (ref 3.5–5.0)
Alkaline Phosphatase: 23 U/L — ABNORMAL LOW (ref 38–126)
Anion gap: 1 — ABNORMAL LOW (ref 5–15)
BUN: 48 mg/dL — ABNORMAL HIGH (ref 8–23)
CO2: 21 mmol/L — ABNORMAL LOW (ref 22–32)
Calcium: 8.6 mg/dL — ABNORMAL LOW (ref 8.9–10.3)
Chloride: 117 mmol/L — ABNORMAL HIGH (ref 98–111)
Creatinine, Ser: 1.07 mg/dL — ABNORMAL HIGH (ref 0.44–1.00)
GFR, Estimated: 53 mL/min — ABNORMAL LOW (ref >60)
Glucose, Bld: 246 mg/dL — ABNORMAL HIGH (ref 70–99)
Potassium: 5.3 mmol/L — ABNORMAL HIGH (ref 3.5–5.1)
Sodium: 139 mmol/L (ref 135–145)
Total Bilirubin: 1.4 mg/dL — ABNORMAL HIGH (ref 0.3–1.2)
Total Protein: <3 g/dL — ABNORMAL LOW (ref 6.5–8.1)

## 2021-04-18 LAB — HEMOGLOBIN AND HEMATOCRIT, BLOOD
HCT: 22.5 % — ABNORMAL LOW (ref 36.0–46.0)
Hemoglobin: 7.9 g/dL — ABNORMAL LOW (ref 12.0–15.0)

## 2021-04-18 LAB — MAGNESIUM: Magnesium: 1.7 mg/dL (ref 1.7–2.4)

## 2021-04-18 LAB — URINALYSIS, ROUTINE W REFLEX MICROSCOPIC
Bacteria, UA: NONE SEEN
Bilirubin Urine: NEGATIVE
Glucose, UA: NEGATIVE mg/dL
Hgb urine dipstick: NEGATIVE
Ketones, ur: NEGATIVE mg/dL
Nitrite: NEGATIVE
Protein, ur: NEGATIVE mg/dL
Specific Gravity, Urine: 1.012 (ref 1.005–1.030)
pH: 5 (ref 5.0–8.0)

## 2021-04-18 LAB — GLUCOSE, CAPILLARY
Glucose-Capillary: 138 mg/dL — ABNORMAL HIGH (ref 70–99)
Glucose-Capillary: 143 mg/dL — ABNORMAL HIGH (ref 70–99)
Glucose-Capillary: 169 mg/dL — ABNORMAL HIGH (ref 70–99)
Glucose-Capillary: 180 mg/dL — ABNORMAL HIGH (ref 70–99)
Glucose-Capillary: 211 mg/dL — ABNORMAL HIGH (ref 70–99)
Glucose-Capillary: 213 mg/dL — ABNORMAL HIGH (ref 70–99)

## 2021-04-18 LAB — BASIC METABOLIC PANEL
Anion gap: 4 — ABNORMAL LOW (ref 5–15)
BUN: 51 mg/dL — ABNORMAL HIGH (ref 8–23)
CO2: 19 mmol/L — ABNORMAL LOW (ref 22–32)
Calcium: 8.9 mg/dL (ref 8.9–10.3)
Chloride: 117 mmol/L — ABNORMAL HIGH (ref 98–111)
Creatinine, Ser: 1.14 mg/dL — ABNORMAL HIGH (ref 0.44–1.00)
GFR, Estimated: 49 mL/min — ABNORMAL LOW (ref >60)
Glucose, Bld: 235 mg/dL — ABNORMAL HIGH (ref 70–99)
Potassium: 5.3 mmol/L — ABNORMAL HIGH (ref 3.5–5.1)
Sodium: 140 mmol/L (ref 135–145)

## 2021-04-18 LAB — PHOSPHORUS: Phosphorus: 5.1 mg/dL — ABNORMAL HIGH (ref 2.5–4.6)

## 2021-04-18 LAB — PROTIME-INR
INR: 1.5 — ABNORMAL HIGH (ref 0.8–1.2)
Prothrombin Time: 18.3 seconds — ABNORMAL HIGH (ref 11.4–15.2)

## 2021-04-18 LAB — PREPARE RBC (CROSSMATCH)

## 2021-04-18 SURGERY — EGD (ESOPHAGOGASTRODUODENOSCOPY)
Anesthesia: Monitor Anesthesia Care

## 2021-04-18 MED ORDER — MIDAZOLAM HCL (PF) 10 MG/2ML IJ SOLN
INTRAMUSCULAR | Status: DC | PRN
  Administered 2021-04-18 (×2): 2 mg via INTRAVENOUS

## 2021-04-18 MED ORDER — INSULIN ASPART 100 UNIT/ML IJ SOLN
0.0000 [IU] | INTRAMUSCULAR | Status: DC
  Administered 2021-04-18 (×2): 2 [IU] via SUBCUTANEOUS
  Administered 2021-04-18: 1 [IU] via SUBCUTANEOUS
  Administered 2021-04-18: 3 [IU] via SUBCUTANEOUS
  Administered 2021-04-19 (×2): 1 [IU] via SUBCUTANEOUS

## 2021-04-18 MED ORDER — SODIUM ZIRCONIUM CYCLOSILICATE 10 G PO PACK
10.0000 g | PACK | Freq: Once | ORAL | Status: AC
  Administered 2021-04-18: 10 g via ORAL
  Filled 2021-04-18: qty 1

## 2021-04-18 MED ORDER — SODIUM CHLORIDE (PF) 0.9 % IJ SOLN
PREFILLED_SYRINGE | INTRAMUSCULAR | Status: DC | PRN
  Administered 2021-04-18: 2 mL

## 2021-04-18 MED ORDER — SODIUM CHLORIDE 0.9% FLUSH: 10.0000 mL | INTRAVENOUS | Status: DC | PRN

## 2021-04-18 MED ORDER — MAGNESIUM SULFATE 2 GM/50ML IV SOLN
2.0000 g | Freq: Once | INTRAVENOUS | Status: AC
  Administered 2021-04-18: 2 g via INTRAVENOUS
  Filled 2021-04-18: qty 50

## 2021-04-18 MED ORDER — SODIUM CHLORIDE 0.9 % IV SOLN: INTRAVENOUS | Status: DC

## 2021-04-18 MED ORDER — SODIUM CHLORIDE 0.9% FLUSH
10.0000 mL | Freq: Two times a day (BID) | INTRAVENOUS | Status: DC
  Administered 2021-04-18 – 2021-04-25 (×15): 10 mL

## 2021-04-18 MED ORDER — BACLOFEN 10 MG PO TABS
10.0000 mg | ORAL_TABLET | Freq: Three times a day (TID) | ORAL | Status: DC | PRN
  Administered 2021-04-18 – 2021-04-22 (×7): 10 mg via ORAL
  Filled 2021-04-18 (×9): qty 1

## 2021-04-18 MED ORDER — FENTANYL CITRATE (PF) 100 MCG/2ML IJ SOLN
INTRAMUSCULAR | Status: DC | PRN
  Administered 2021-04-18 (×2): 25 ug via INTRAVENOUS

## 2021-04-18 MED ORDER — MIDAZOLAM HCL (PF) 5 MG/ML IJ SOLN
INTRAMUSCULAR | Status: AC
  Filled 2021-04-18: qty 2

## 2021-04-18 MED ORDER — SODIUM CHLORIDE 0.9% IV SOLUTION: Freq: Once | INTRAVENOUS | Status: DC

## 2021-04-18 MED ORDER — FENTANYL CITRATE (PF) 100 MCG/2ML IJ SOLN
INTRAMUSCULAR | Status: AC
  Filled 2021-04-18: qty 4

## 2021-04-18 MED ORDER — SODIUM CHLORIDE 0.9% IV SOLUTION: Freq: Once | INTRAVENOUS | Status: AC

## 2021-04-18 MED ORDER — DIPHENHYDRAMINE HCL 50 MG/ML IJ SOLN
INTRAMUSCULAR | Status: AC
  Filled 2021-04-18: qty 1

## 2021-04-18 MED ORDER — ACETAMINOPHEN 10 MG/ML IV SOLN
1000.0000 mg | Freq: Three times a day (TID) | INTRAVENOUS | Status: AC
  Administered 2021-04-18 – 2021-04-19 (×3): 1000 mg via INTRAVENOUS
  Filled 2021-04-18 (×3): qty 100

## 2021-04-18 NOTE — Progress Notes (Addendum)
NAME:  [name], MRN:  [id number], DOB:  [birth year], LOS: 1 ADMISSION DATE:  04/16/2021, CONSULTATION DATE:  04/18/21 REFERRING MD:  Vanessa Ronneby, CHIEF COMPLAINT:  GIB   History of Present Illness:  [name] is a 80 y.o. F with PMH of HTN, DM, macular degeneration, recent grief and chronic pain in the setting of the passing of her husband who presented to the ED  on 9/6 with complaints of bright red rectal bleeding with worsening back pain.  She is not on blood thinners or anti-platelet medications at baseline.  Had been taking NSAIDS for back pain.  Her initial Hgb was 7.4 and she was transfused, given IVF and protonix and and admitted to the family medicine service.    On 9/7 underwent EGD with findings of large amount of bright red blood in the entire stomach and was referred to IR.  She underwent post-mesenteric arteriogram and embolization of the GDA for duodenal bleeding.  Post-op continued to have dark red rectal bleeding and borderline low BP with Hgb down from 10.2 this afternoon to 6.5, therefore PCCM consulted.  Pt awake at the time of evaluation and continues to complain of primarily back pain. Has received 8 units PRBC's and 3 units FFP since admission with two more units PRBC's ordered   Pertinent  Medical History   has a past medical history of Asthma, Depression, Diabetes mellitus without complication ([place]), History of surgery on arm, Hypertension, Macular degeneration, Migraine, Pneumonia, and Urinary incontinence.   Significant Hospital Events: Including procedures, antibiotic start and stop dates in addition to other pertinent events   9/6 Presented with PRBPR and anemia, admitted to IMTS 9/7 EGD and IR embolization for  gastroduodenal artery bleed, continued maroon stools and borderline hypotension, to transfer to ICU  Interim History / Subjective:   Denies any abd pain, N/V Complains of ongoing severe back pain (thoracic), states is chronic in nature, received several  doses of fentanyl overnight.  RN reports several small dark red bowel movements ongoing, no clots  Off NE but remains tachycardic in the 120s Afebrile   Objective   Blood pressure 103/61, pulse (!) 119, temperature (!) 97.5 F (36.4 C), temperature source Oral, resp. rate 11, height '4\' 11"'$  (1.499 m), weight 84.6 kg, SpO2 99 %.        Intake/Output Summary (Last 24 hours) at 04/18/2021 0800 Last data filed at 04/18/2021 0630 Gross per 24 hour  Intake 6608.3 ml  Output 2165 ml  Net 4443.3 ml    Filed Weights   04/16/21 1754 04/17/21 2221  Weight: 76.2 kg 84.6 kg   General:  Ill, pale, appearing elderly female sitting upright in bed in NAD HEENT: MM pale/moist, pupils 3/reactive, R IJ CVL Neuro: Aox 3, MAE CV: ST, no murmur, right groin site wnl, +dp +1 PULM:  non labored, CTA GI: soft, NT, ND, +BS, foley cath Extremities: cool/dry, generalized +1 edema, left wrist Aline Skin: no rashes  Labs reviewed:  CBC- WBC 10.9-> 14.7, Hgb 9.8-> 7.5  Resolved Hospital Problem list     Assessment & Plan:    Acute blood loss anemia secondary to acute upper GI bleeding, possibly developing hemorrhagic shock s/p EGD and GDA embolization by IR 9/7 Thus far has received 10 units PRBC, 3 FFP, 1 plt P: - remains off NE but remains tachycardic with further drop in Hgb 9.8-> 7.5 and ongoing stool (some may be residual clearing), will transfuse additional 1 u PRBC and 1 plt, consider transfusing additional FFP  if further products ordered - goal MAP remains > 65 - IR and GI following, appreciate further recs - remains NPO except meds - PPI BID - continue serial H/H q6hrs - monitor closely for volume overload s/p transfused multiple products, s/p lasix overnight.  Hold on any further diuretics for now.  Currently on room air, clear lungs.   Acute Kidney Injury Hyperkalemia  Likely secondary to hypoperfusion in the setting of GIB P: - sCr 1.07-> 1.14, BUN 48-> 51, UOP ~ 0.8-1.2 ml/kg/hr - no  EKG changes at this time, hold off on lokelmia - recheck BMET this afternoon - trend renal indices closely   Leukocytosis  Possibly reactive, evidence of peri-colonic fat stranding on CT, consider developing colitis P: - UA with trace leukocytes, otherwise no bacteria or WBC, monitor  - remains afebrile  - monitor fever curve, low threshold for empiric antibiotics  - CBC in am   Type 2 DM -continue SSI sensitive   Depression, Neuropathy -home Cymbalta held  HL -continue statin  Hx of Asthma  -no exacerbation, continue albuterol  Back pain, chronic - reports mostly thoracic, and that she had a mechanical fall 3 weeks ago with negative imaging since - continue with tylenol and prn Oxy IR moderate pain, and fentanyl for severe pain.  Will add back home baclofen.  Ideally could try lido patch but questionable allergy to this.     Best Practice (right click and "Reselect all SmartList Selections" daily)   Diet/type: NPO DVT prophylaxis: SCD GI prophylaxis: PPI Lines: Central line and yes and it is still needed Foley:  Yes, and it is still needed Code Status:  full code Last date of multidisciplinary goals of care discussion '[]'$   Labs   CBC: Recent Labs  Lab 04/16/21 1807 04/17/21 0950 04/17/21 1623 04/17/21 1740 04/17/21 2038 04/17/21 2350  WBC 11.5* 18.3*  --   --   --  10.9*  NEUTROABS 9.0*  --   --   --   --   --   HGB 7.4* 7.9* 10.2* 7.5* 6.5* 9.8*  HCT 25.1* 23.8* 30.0* 22.0* 19.0* 28.2*  MCV 92.3 93.0  --   --   --  88.1  PLT 236 129*  --   --   --  53*     Basic Metabolic Panel: Recent Labs  Lab 04/16/21 1807 04/17/21 0950 04/17/21 1623 04/17/21 1740 04/17/21 2038 04/18/21 0149 04/18/21 0150  NA 138 138 140 142 142 139  --   K 5.3* 5.2* 6.4* 5.6* 5.4* 5.3*  --   CL 110 116*  --   --  112* 117*  --   CO2 24 16*  --   --   --  21*  --   GLUCOSE 131* 182*  --   --  175* 246*  --   BUN 48* 61*  --   --  44* 48*  --   CREATININE 1.13* 1.32*  --    --  1.00 1.07*  --   CALCIUM 8.4* 7.2*  --   --   --  8.6*  --   MG  --   --   --   --   --   --  1.7    GFR: Estimated Creatinine Clearance: 40.9 mL/min (A) (by C-G formula based on SCr of 1.07 mg/dL (H)). Recent Labs  Lab 04/16/21 1807 04/17/21 0950 04/17/21 2147 04/17/21 2350  WBC 11.5* 18.3*  --  10.9*  LATICACIDVEN  --   --  1.3  --      Liver Function Tests: Recent Labs  Lab 04/16/21 1807 04/17/21 0950 04/18/21 0149  AST 30 78* 52*  ALT 19 57* 32  ALKPHOS 50 33* 23*  BILITOT 0.6 0.5 1.4*  PROT 4.7* 3.0* <3.0*  ALBUMIN 2.7* 1.7* 1.7*    Recent Labs  Lab 04/16/21 1807  LIPASE 27    No results for input(s): AMMONIA in the last 168 hours.  ABG    Component Value Date/Time   PHART 7.354 04/17/2021 1740   PCO2ART 37.7 04/17/2021 1740   PO2ART 411 (H) 04/17/2021 1740   HCO3 21.5 04/17/2021 1740   TCO2 20 (L) 04/17/2021 2038   ACIDBASEDEF 4.0 (H) 04/17/2021 1740   O2SAT 100.0 04/17/2021 1740      Coagulation Profile: Recent Labs  Lab 04/16/21 1807  INR 1.3*     Cardiac Enzymes: No results for input(s): CKTOTAL, CKMB, CKMBINDEX, TROPONINI in the last 168 hours.  HbA1C: HbA1c, POC (controlled diabetic range)  Date/Time Value Ref Range Status  04/04/2021 05:02 PM 6.3 0.0 - 7.0 % Final  02/28/2021 12:25 PM 6.2 0.0 - 7.0 % Final   Hgb A1c MFr Bld  Date/Time Value Ref Range Status  04/17/2021 09:50 AM 5.6 4.8 - 5.6 % Final    Comment:    (NOTE) Pre diabetes:          5.7%-6.4%  Diabetes:              >6.4%  Glycemic control for   <7.0% adults with diabetes     CBG: Recent Labs  Lab 04/17/21 0746 04/17/21 1116 04/17/21 2228 04/18/21 0349  GLUCAP 171* 153* 178* 211*       Critical care time:  51mnutes    CRITICAL CARE Performed by: BKennieth Rad  Total critical care time:  35 minutes  Critical care time was exclusive of separately billable procedures and treating other patients.  Critical care was necessary to treat  or prevent imminent or life-threatening deterioration.  Critical care was time spent personally by me on the following activities: development of treatment plan with patient and/or surrogate as well as nursing, discussions with consultants, evaluation of patient's response to treatment, examination of patient, obtaining history from patient or surrogate, ordering and performing treatments and interventions, ordering and review of laboratory studies, ordering and review of radiographic studies, pulse oximetry and re-evaluation of patient's condition.    BKennieth Rad ACNP [facility] 04/18/2021, 8:01 AM  See Amion for pager If no response to pager, please call PCCM consult pager After 7:00 pm call Elink

## 2021-04-18 NOTE — Hospital Course (Addendum)
Hospital course by problem listed below:  GI Bleed  Anemia Patient was admitted for rectal bleeding that began on the morning of 9/6 that was burgundy colored at first then) and was copious which led her to go to the ED. Patient had been taking them up until 1 month ago and regularly taking ibuprofen for 2 weeks. In the ED had hemoglobin of 7.4 and FOBT positive with a CTA that showed no acute bleed.  Patient had low blood pressures in the ED to 90s/40s to 50s. EGD was performed which showed frank blood in the stomach and clots in the duodenum.  IR embolized the GDA during emergent intervention.  Patient was transferred to ICU for massive transfusion protocol.  Pressors were used during ICU stay. On hospital day 2 patient got repeat EGD due to persistent bloody stools.  Repeat EGD with hemoclips, epinephrine injection, and bipolar probe in 2 nonbleeding duodenal vessels. Superficial gastric ulcers were also seen without bleeding. Patient was stabilized and transferred back to the floor on hospital day 5.  Patient continued to have dark maroon fluid when going to the toilet.  At the end of his hospital stay hemoglobin remained stable above 7, and did not have any bleeding per rectum or during bowel movements.  During hospital admission received: 11 units of pRBC, 2 units of platelets, and 3 units of fresh frozen plasma.  Chest Pressure  ACS r/o  Pt reported chest pressure and SOB on admission. EKG showed NSR and Trops trended flat (26 to 23). Chest x-ray was unremarkable. Per nursing she had an episode of chest tightness with atrial fibrillation on cardiac monitoring.  Repeat EKG was performed which showed sinus tachycardia without acute ST elevation. Troponins were trended (46, 54, 59, 55) and were flat. ACS ruled out and patient did not have further chest pain throughout hospital course.  Chronic Back Pain  Patient has a history of chronic lower back pain for which she has been taking Norco 1 tablet  q4hours PRN and baclofen 10 mg 3 times daily as needed since 04/05/21. She was previously opiate naive. She could not receive NSAIDs due to her GI bleed. Patient had CT of  chest, abdomen, and pelvis and ruled out occult fracture, no acute process identified. Patient has a long history of chronic back pain and follows with PM&R as an outpatient for management. Tylenol was scheduled '650mg'$  q6hrs. Due to uncontrolled back pain patient was started on baclofen, fentanyl, lidocaine patch, and oxycodone. Patient only able to achieve minimal relief with opiates. At discharge patient had titrated down to oxycodone 7.5 mg four times a day.    AKI, resolved Creatinine on admission was 1.13 with a BUN of 48. IVF fluids administered and this elevation was likely due to hypoperfusion in the setting of GI bleed.  Creatinine at discharge was wnl.   DM2  Pt is on home med of ozempic '2mg'$  weekly on Sundays. Last HA1C: 6.2 on 02/28/21, Regular CBG checks, SSI was held due to hypoglycemia.   Asthma  Pt was asymptomatic. Home meds: Albuterol 2 puffs q4hr as needed were held.    HLD  Home med: Crestor 20 mg was continued   Migraines  Pt was taken off of Topamax due to itching that was caused by the medication outpatient. Held home metoprolol '25mg'$  due to soft pressures. This was restarted as patient stabilized during hospital stay.   RLL PNA, resolved  On chest x-ray on 04/05/21: improving RLL PNA. Pt was noted in  August 2022 to have a significant cough and new sputum production. She was found to have a RLL PNA and was given doxycycline and cefdinir for treatment. She had completed her abx. Chest xray showed no active disease. Patient was given supplemental O2, by time of discharge was on room air saturating well.    Adjustment Reaction with Depression  Pt has had a very difficult time since the death of her husband in 23-Jan-2021. Home med: Cymbalta '60mg'$  daily was restarted.  Was started on Atarax during hospitalization due  to anxiety. She was started on buspirone.   Peripheral Neuropathy  Cymbalta 60 mg daily was restarted when patient on the floor.  Incidental Pulmonary Nodules on CTA 7 mm solid pulmonary nodule in the right middle lobe. Non-contrast chest CT at 6-12 months is recommended based on current imaging. If the nodule is stable at time of repeat CT, then future CT at 18-24 months (from today's scan) is considered optional for low-risk patients.  HTN Continued on her enalapril and metoprolol after she was stabilized.   Follow up recommendations: Outpatient GI follow up with Dr. Benson Norway Non-contrast chest CT at 6-12 months is recommended. If the nodule is stable at time of repeat CT, then future CT at 18-24 months (from today's scan) is considered optional for low-risk patients. Avoid NSAIDS due to history of profuse GI Bleed Chronic Back pain, discharged on regimen of Baclofen TID prn, Oxycodone 7.5 mg 4 times a day, please continue to wean 10% daily:  Day 1: 7.5 mg 3 x day  Day 2: 5 mg 4 x a day   Day 3: 5 mg 3 x day  Day 4: 2.5 mg 5 x day  Day 5: 2.5 mg 4 x day  Day 6: 2.5 mg 3 x day  Day 7: 2.5 mg 2 x day  Day 8: 2.5 mg 1 x daily  Day 6: no oxycodone Started buspirone while inpatient, consider outpatient counseling for anxiety and chronic pain possibly CBT. Consider complex regional pain syndrome treatments and workup. She was discharged on 0.1 mg clonidine patch and pregabalin. Reevaluate metoprolol use after clonidine patch.

## 2021-04-18 NOTE — Progress Notes (Signed)
[facility] Progress Note Patient Name: [name] DOB: 08-22-1940 MRN: [id number]   Date of Service  04/18/2021  HPI/Events of Note  Patient's K+ is 5.3 but she just got 20 mg of Lasix, next BMP is scheduled for 8 a.m.  eICU Interventions  Will follow up 8 a.m. BMP result prior to further Rx for hyperkalemia.        Tayten Bergdoll U Shante Maysonet 04/18/2021, 4:00 AM

## 2021-04-18 NOTE — Progress Notes (Signed)
Chief Complaint: Patient was seen today for follow up angiogram/embolization   Supervising Physician: Ruthann Cancer  Patient Status: [facility] - In-pt  Subjective: Pt s/p percutaneous coil embolization of a distal pancreaticoduodenal branch of the GDA as well as the entirety of the GDA for acute upper GI bleed from duodenal ulcer. Pt still tachycardic. Passing some dark bloody BMs Family at bedside.  Objective: Physical Exam: BP 115/66   Pulse (!) 136   Temp 97.9 F (36.6 C) (Oral)   Resp 16   Ht 4' 11"  (1.499 m)   Wt 84.6 kg   SpO2 96%   BMI 37.67 kg/m  Awake and alert. Abd: soft, NT Ext: (R)groin soft, NT, no hematoma   Current Facility-Administered Medications:    0.9 %  sodium chloride infusion (Manually program via Guardrails IV Fluids), , Intravenous, Once, Ganta, Anupa, DO, Held at 04/17/21 1103   0.9 %  sodium chloride infusion (Manually program via Guardrails IV Fluids), , Intravenous, Once, Gleason, Mickel Baas R, PA-C   0.9 %  sodium chloride infusion, , Intravenous, Once, Welborn, Ryan, DO   acetaminophen (OFIRMEV) IV 1,000 mg, 1,000 mg, Intravenous, Q8H, Dewald, Cheryle Horsfall, MD   baclofen (LIORESAL) tablet 10 mg, 10 mg, Oral, TID PRN, Jennelle Human B, NP, 10 mg at 04/18/21 7741   Chlorhexidine Gluconate Cloth 2 % PADS 6 each, 6 each, Topical, Daily, Hensel, Jamal Collin, MD, 6 each at 04/18/21 0600   fentaNYL (SUBLIMAZE) injection 25 mcg, 25 mcg, Intravenous, Q2H PRN, Gleason, Otilio Carpen, PA-C, 25 mcg at 04/18/21 0648   insulin aspart (novoLOG) injection 0-9 Units, 0-9 Units, Subcutaneous, Q4H, Ogan, Okoronkwo U, MD, 3 Units at 04/18/21 0739   iohexol (OMNIPAQUE) 240 MG/ML injection 50 mL, 50 mL, Intravenous, Once PRN, Sandi Mariscal, MD   magnesium sulfate IVPB 2 g 50 mL, 2 g, Intravenous, Once, Freddi Starr, MD, Last Rate: 50 mL/hr at 04/18/21 1225, 2 g at 04/18/21 1225   norepinephrine (LEVOPHED) 64m in 2577mpremix infusion, 0-40 mcg/min, Intravenous, Titrated,  Gleason, LaOtilio CarpenPA-C, Stopped at 04/18/21 0451   oxyCODONE (Oxy IR/ROXICODONE) immediate release tablet 5 mg, 5 mg, Oral, Q6H PRN, WeVanessa DurhamRyan, DO, 5 mg at 04/18/21 0831   pantoprazole (PROTONIX) injection 40 mg, 40 mg, Intravenous, Q12H, Ganta, Anupa, DO, 40 mg at 04/18/21 [id number] rosuvastatin (CRESTOR) tablet 20 mg, 20 mg, Oral, Daily, Welborn, Ryan, DO, 20 mg at 04/18/21 [id number] sodium chloride flush (NS) 0.9 % injection 10-40 mL, 10-40 mL, Intracatheter, Q12H, Hensel, William A, MD, 10 mL at 04/18/21 0923   sodium chloride flush (NS) 0.9 % injection 10-40 mL, 10-40 mL, Intracatheter, PRN, HeZenia ResidesMD  Labs: CBC Recent Labs    04/17/21 2350 04/18/21 0639  WBC 10.9* 14.7*  HGB 9.8* 7.5*  HCT 28.2* 21.7*  PLT 53* 97*   BMET Recent Labs    04/18/21 0149 04/18/21 0639  NA 139 140  K 5.3* 5.3*  CL 117* 117*  CO2 21* 19*  GLUCOSE 246* 235*  BUN 48* 51*  CREATININE 1.07* 1.14*  CALCIUM 8.6* 8.9   LFT Recent Labs    04/16/21 1807 04/17/21 0950 04/18/21 0149  PROT 4.7*   < > <3.0*  ALBUMIN 2.7*   < > 1.7*  AST 30   < > 52*  ALT 19   < > 32  ALKPHOS 50   < > 23*  BILITOT 0.6   < > 1.4*  LIPASE 27  --   --    < > =  values in this interval not displayed.   PT/INR Recent Labs    04/16/21 1807  LABPROT 16.5*  INR 1.3*     Studies/Results: IR Angiogram Visceral Selective  Result Date: 04/18/2021 INDICATION: Acute upper GI bleeding secondary to duodenal ulcer. Please perform mesenteric arteriogram and percutaneous embolization as indicated. EXAM: 1. ULTRASOUND GUIDANCE FOR ARTERIAL ACCESS 2. SELECTIVE CELIAC ARTERIOGRAM 3. SUB SELECTIVE COMMON HEPATIC ARTERIOGRAM 4. SUB SELECTIVE GASTRODUODENAL ARTERIOGRAM AND PERCUTANEOUS COIL EMBOLIZATION COMPARISON:  CTA abdomen and pelvis - 04/16/2021 MEDICATIONS: None ANESTHESIA/SEDATION: Patient remains intubated and sedated by the anesthesia department following endoscopy CONTRAST:  75 cc Omnipaque 240 FLUOROSCOPY TIME:   41 minutes, 18 seconds (761 mGy) COMPLICATIONS: None immediate. PROCEDURE: Informed consent was obtained from the patient following explanation of the procedure, risks, benefits and alternatives. All questions were addressed. A time out was performed prior to the initiation of the procedure. Maximal barrier sterile technique utilized including caps, mask, sterile gowns, sterile gloves, large sterile drape, hand hygiene, and Betadine prep. The right femoral head was marked fluoroscopically. Under sterile conditions and local anesthesia, the right common femoral artery access was performed with a micropuncture needle. Under direct ultrasound guidance, the right common femoral was accessed with a micropuncture kit. An ultrasound image was saved for documentation purposes. This allowed for placement of a 5-French vascular sheath. A limited arteriogram was performed through the side arm of the sheath confirming appropriate access within the right common femoral artery. Over a Bentson wire, a Mickelson catheter was advanced the caudal aspect of the thoracic aorta where was reformed, back bled and flushed. The Mickelson catheter was then utilized to select the celiac artery and a selective celiac arteriogram was performed With the use of a fathom 16 microwire, a STC microcatheter was utilized to select the GDA and a selective gastroduodenal arteriogram was performed. The microcatheter was then utilized to select a distal pancreaticoduodenal artery and a sub selective arteriogram was performed. The distal pancreaticoduodenal artery as well as the GDA was then percutaneously coil embolized with multiple overlapping 2 mm, 3 mm, 4 mm and 5 mm interlock soft and fiber coils to the origin of the GDA. Multiple selective arteriograms were performed during the embolization. The microcatheter was then retracted to the level of the common hepatic artery and a post embolization a shin selective common hepatic arteriogram was  performed Images reviewed in the procedure was terminated All wires, catheters and sheaths were removed from the patient. Hemostasis was achieved at the right groin access site with deployment of an Angio-Seal closure device and manual compression. A dressing was applied. The patient tolerated the procedure well with plans for extubation immediately following completion of the procedure. FINDINGS: Selective celiac arteriogram demonstrates an ill-defined area of extravasation at the location of the descending duodenum (image 45, series 3), compatible with the findings preceding endoscopy. The ill-defined area of extravasation appears to be supplied from a distal branch of one of the pancreaticoduodenal arteries arising from the mid aspect of the GDA. Technically successful percutaneous coil embolization of a distal pancreaticoduodenal branch of the GDA as well as the entirety of the GDA. IMPRESSION: Technically successful percutaneous coil embolization of a distal pancreaticoduodenal branch of the GDA as well as the entirety of the GDA for acute upper GI bleeding secondary to duodenal ulcer. PLAN: - The patient is to remain flat for 4 hours with right leg straight. - The patient will likely experience several bloody bowel movements and may continue to require additional resuscitation (as she  was bleeding both before and during the procedure), however ultimately I am hopeful she will stabilize in the coming days. - Repeat endoscopy may be performed at the discretion of the GI service as indicated. Electronically Signed   By: Sandi Mariscal M.D.   On: 04/18/2021 10:01   IR Angiogram Selective Each Additional Vessel  Result Date: 04/18/2021 INDICATION: Acute upper GI bleeding secondary to duodenal ulcer. Please perform mesenteric arteriogram and percutaneous embolization as indicated. EXAM: 1. ULTRASOUND GUIDANCE FOR ARTERIAL ACCESS 2. SELECTIVE CELIAC ARTERIOGRAM 3. SUB SELECTIVE COMMON HEPATIC ARTERIOGRAM 4. SUB  SELECTIVE GASTRODUODENAL ARTERIOGRAM AND PERCUTANEOUS COIL EMBOLIZATION COMPARISON:  CTA abdomen and pelvis - 04/16/2021 MEDICATIONS: None ANESTHESIA/SEDATION: Patient remains intubated and sedated by the anesthesia department following endoscopy CONTRAST:  75 cc Omnipaque 240 FLUOROSCOPY TIME:  41 minutes, 18 seconds (562 mGy) COMPLICATIONS: None immediate. PROCEDURE: Informed consent was obtained from the patient following explanation of the procedure, risks, benefits and alternatives. All questions were addressed. A time out was performed prior to the initiation of the procedure. Maximal barrier sterile technique utilized including caps, mask, sterile gowns, sterile gloves, large sterile drape, hand hygiene, and Betadine prep. The right femoral head was marked fluoroscopically. Under sterile conditions and local anesthesia, the right common femoral artery access was performed with a micropuncture needle. Under direct ultrasound guidance, the right common femoral was accessed with a micropuncture kit. An ultrasound image was saved for documentation purposes. This allowed for placement of a 5-French vascular sheath. A limited arteriogram was performed through the side arm of the sheath confirming appropriate access within the right common femoral artery. Over a Bentson wire, a Mickelson catheter was advanced the caudal aspect of the thoracic aorta where was reformed, back bled and flushed. The Mickelson catheter was then utilized to select the celiac artery and a selective celiac arteriogram was performed With the use of a fathom 16 microwire, a STC microcatheter was utilized to select the GDA and a selective gastroduodenal arteriogram was performed. The microcatheter was then utilized to select a distal pancreaticoduodenal artery and a sub selective arteriogram was performed. The distal pancreaticoduodenal artery as well as the GDA was then percutaneously coil embolized with multiple overlapping 2 mm, 3 mm, 4 mm  and 5 mm interlock soft and fiber coils to the origin of the GDA. Multiple selective arteriograms were performed during the embolization. The microcatheter was then retracted to the level of the common hepatic artery and a post embolization a shin selective common hepatic arteriogram was performed Images reviewed in the procedure was terminated All wires, catheters and sheaths were removed from the patient. Hemostasis was achieved at the right groin access site with deployment of an Angio-Seal closure device and manual compression. A dressing was applied. The patient tolerated the procedure well with plans for extubation immediately following completion of the procedure. FINDINGS: Selective celiac arteriogram demonstrates an ill-defined area of extravasation at the location of the descending duodenum (image 45, series 3), compatible with the findings preceding endoscopy. The ill-defined area of extravasation appears to be supplied from a distal branch of one of the pancreaticoduodenal arteries arising from the mid aspect of the GDA. Technically successful percutaneous coil embolization of a distal pancreaticoduodenal branch of the GDA as well as the entirety of the GDA. IMPRESSION: Technically successful percutaneous coil embolization of a distal pancreaticoduodenal branch of the GDA as well as the entirety of the GDA for acute upper GI bleeding secondary to duodenal ulcer. PLAN: - The patient is to remain flat  for 4 hours with right leg straight. - The patient will likely experience several bloody bowel movements and may continue to require additional resuscitation (as she was bleeding both before and during the procedure), however ultimately I am hopeful she will stabilize in the coming days. - Repeat endoscopy may be performed at the discretion of the GI service as indicated. Electronically Signed   By: Sandi Mariscal M.D.   On: 04/18/2021 10:01   IR US Guide Vasc Access Right  Result Date: 04/18/2021 INDICATION:  Acute upper GI bleeding secondary to duodenal ulcer. Please perform mesenteric arteriogram and percutaneous embolization as indicated. EXAM: 1. ULTRASOUND GUIDANCE FOR ARTERIAL ACCESS 2. SELECTIVE CELIAC ARTERIOGRAM 3. SUB SELECTIVE COMMON HEPATIC ARTERIOGRAM 4. SUB SELECTIVE GASTRODUODENAL ARTERIOGRAM AND PERCUTANEOUS COIL EMBOLIZATION COMPARISON:  CTA abdomen and pelvis - 04/16/2021 MEDICATIONS: None ANESTHESIA/SEDATION: Patient remains intubated and sedated by the anesthesia department following endoscopy CONTRAST:  75 cc Omnipaque 240 FLUOROSCOPY TIME:  41 minutes, 18 seconds (559 mGy) COMPLICATIONS: None immediate. PROCEDURE: Informed consent was obtained from the patient following explanation of the procedure, risks, benefits and alternatives. All questions were addressed. A time out was performed prior to the initiation of the procedure. Maximal barrier sterile technique utilized including caps, mask, sterile gowns, sterile gloves, large sterile drape, hand hygiene, and Betadine prep. The right femoral head was marked fluoroscopically. Under sterile conditions and local anesthesia, the right common femoral artery access was performed with a micropuncture needle. Under direct ultrasound guidance, the right common femoral was accessed with a micropuncture kit. An ultrasound image was saved for documentation purposes. This allowed for placement of a 5-French vascular sheath. A limited arteriogram was performed through the side arm of the sheath confirming appropriate access within the right common femoral artery. Over a Bentson wire, a Mickelson catheter was advanced the caudal aspect of the thoracic aorta where was reformed, back bled and flushed. The Mickelson catheter was then utilized to select the celiac artery and a selective celiac arteriogram was performed With the use of a fathom 16 microwire, a STC microcatheter was utilized to select the GDA and a selective gastroduodenal arteriogram was performed.  The microcatheter was then utilized to select a distal pancreaticoduodenal artery and a sub selective arteriogram was performed. The distal pancreaticoduodenal artery as well as the GDA was then percutaneously coil embolized with multiple overlapping 2 mm, 3 mm, 4 mm and 5 mm interlock soft and fiber coils to the origin of the GDA. Multiple selective arteriograms were performed during the embolization. The microcatheter was then retracted to the level of the common hepatic artery and a post embolization a shin selective common hepatic arteriogram was performed Images reviewed in the procedure was terminated All wires, catheters and sheaths were removed from the patient. Hemostasis was achieved at the right groin access site with deployment of an Angio-Seal closure device and manual compression. A dressing was applied. The patient tolerated the procedure well with plans for extubation immediately following completion of the procedure. FINDINGS: Selective celiac arteriogram demonstrates an ill-defined area of extravasation at the location of the descending duodenum (image 45, series 3), compatible with the findings preceding endoscopy. The ill-defined area of extravasation appears to be supplied from a distal branch of one of the pancreaticoduodenal arteries arising from the mid aspect of the GDA. Technically successful percutaneous coil embolization of a distal pancreaticoduodenal branch of the GDA as well as the entirety of the GDA. IMPRESSION: Technically successful percutaneous coil embolization of a distal pancreaticoduodenal branch of the  GDA as well as the entirety of the GDA for acute upper GI bleeding secondary to duodenal ulcer. PLAN: - The patient is to remain flat for 4 hours with right leg straight. - The patient will likely experience several bloody bowel movements and may continue to require additional resuscitation (as she was bleeding both before and during the procedure), however ultimately I am  hopeful she will stabilize in the coming days. - Repeat endoscopy may be performed at the discretion of the GI service as indicated. Electronically Signed   By: Sandi Mariscal M.D.   On: 04/18/2021 10:01   DG Chest Portable 1 View  Result Date: 04/16/2021 CLINICAL DATA:  Chest pain EXAM: PORTABLE CHEST 1 VIEW COMPARISON:  04/13/2021. FINDINGS: Low lung volumes. Unchanged cardiac and mediastinal contours. No focal consolidative opacity. No pleural effusion or pneumothorax. No acute osseous abnormality. IMPRESSION: No active disease. Electronically Signed   By: Merilyn Baba M.D.   On: 04/16/2021 19:08   IR EMBO ART  VEN HEMORR LYMPH EXTRAV  INC GUIDE ROADMAPPING  Result Date: 04/18/2021 INDICATION: Acute upper GI bleeding secondary to duodenal ulcer. Please perform mesenteric arteriogram and percutaneous embolization as indicated. EXAM: 1. ULTRASOUND GUIDANCE FOR ARTERIAL ACCESS 2. SELECTIVE CELIAC ARTERIOGRAM 3. SUB SELECTIVE COMMON HEPATIC ARTERIOGRAM 4. SUB SELECTIVE GASTRODUODENAL ARTERIOGRAM AND PERCUTANEOUS COIL EMBOLIZATION COMPARISON:  CTA abdomen and pelvis - 04/16/2021 MEDICATIONS: None ANESTHESIA/SEDATION: Patient remains intubated and sedated by the anesthesia department following endoscopy CONTRAST:  75 cc Omnipaque 240 FLUOROSCOPY TIME:  41 minutes, 18 seconds (951 mGy) COMPLICATIONS: None immediate. PROCEDURE: Informed consent was obtained from the patient following explanation of the procedure, risks, benefits and alternatives. All questions were addressed. A time out was performed prior to the initiation of the procedure. Maximal barrier sterile technique utilized including caps, mask, sterile gowns, sterile gloves, large sterile drape, hand hygiene, and Betadine prep. The right femoral head was marked fluoroscopically. Under sterile conditions and local anesthesia, the right common femoral artery access was performed with a micropuncture needle. Under direct ultrasound guidance, the right common  femoral was accessed with a micropuncture kit. An ultrasound image was saved for documentation purposes. This allowed for placement of a 5-French vascular sheath. A limited arteriogram was performed through the side arm of the sheath confirming appropriate access within the right common femoral artery. Over a Bentson wire, a Mickelson catheter was advanced the caudal aspect of the thoracic aorta where was reformed, back bled and flushed. The Mickelson catheter was then utilized to select the celiac artery and a selective celiac arteriogram was performed With the use of a fathom 16 microwire, a STC microcatheter was utilized to select the GDA and a selective gastroduodenal arteriogram was performed. The microcatheter was then utilized to select a distal pancreaticoduodenal artery and a sub selective arteriogram was performed. The distal pancreaticoduodenal artery as well as the GDA was then percutaneously coil embolized with multiple overlapping 2 mm, 3 mm, 4 mm and 5 mm interlock soft and fiber coils to the origin of the GDA. Multiple selective arteriograms were performed during the embolization. The microcatheter was then retracted to the level of the common hepatic artery and a post embolization a shin selective common hepatic arteriogram was performed Images reviewed in the procedure was terminated All wires, catheters and sheaths were removed from the patient. Hemostasis was achieved at the right groin access site with deployment of an Angio-Seal closure device and manual compression. A dressing was applied. The patient tolerated the procedure well with plans  for extubation immediately following completion of the procedure. FINDINGS: Selective celiac arteriogram demonstrates an ill-defined area of extravasation at the location of the descending duodenum (image 45, series 3), compatible with the findings preceding endoscopy. The ill-defined area of extravasation appears to be supplied from a distal branch of one  of the pancreaticoduodenal arteries arising from the mid aspect of the GDA. Technically successful percutaneous coil embolization of a distal pancreaticoduodenal branch of the GDA as well as the entirety of the GDA. IMPRESSION: Technically successful percutaneous coil embolization of a distal pancreaticoduodenal branch of the GDA as well as the entirety of the GDA for acute upper GI bleeding secondary to duodenal ulcer. PLAN: - The patient is to remain flat for 4 hours with right leg straight. - The patient will likely experience several bloody bowel movements and may continue to require additional resuscitation (as she was bleeding both before and during the procedure), however ultimately I am hopeful she will stabilize in the coming days. - Repeat endoscopy may be performed at the discretion of the GI service as indicated. Electronically Signed   By: Sandi Mariscal M.D.   On: 04/18/2021 10:01   CT ANGIO GI BLEED  Result Date: 04/16/2021 CLINICAL DATA:  Left upper quadrant abdominal pain. Query diverticular bleed. EXAM: CTA ABDOMEN AND PELVIS WITHOUT AND WITH CONTRAST TECHNIQUE: Multidetector CT imaging of the abdomen and pelvis was performed using the standard protocol during bolus administration of intravenous contrast. Multiplanar reconstructed images and MIPs were obtained and reviewed to evaluate the vascular anatomy. CONTRAST:  58m OMNIPAQUE IOHEXOL 350 MG/ML SOLN COMPARISON:  Abdominal CT 10/08/2015 FINDINGS: VASCULAR Aorta: Moderately advanced aortic atherosclerosis. 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 This is not significantly changed in appearance from noncontrast CT appearance of 2017. There is no periaortic stranding or evidence of acute dissection. Celiac: Patent without evidence of aneurysm, dissection, vasculitis or significant stenosis. Minimal plaque at the origin. SMA: Patent without evidence of aneurysm,  dissection, vasculitis or significant stenosis. Minimal plaque at the origin. Renals: Single bilateral renal arteries. Plaque at the origin of the left greater than right renal artery but no severe stenosis. No dissection or acute findings. IMA: Patent at the origin with intermittent flow, diminutive in caliber. No acute findings. Inflow: Moderately calcified. No aneurysm, dissection, or acute findings. Proximal Outflow: Bilateral common femoral and visualized portions of the superficial and profunda femoral arteries are patent without evidence of aneurysm, dissection, vasculitis or significant stenosis. Veins: Portal venous phase imaging obtained demonstrating patency of the hepatic veins, IVC, and iliac system. The portal and superior mesenteric veins are patent. The splenic vein is patent. No acute venous findings. Review of the MIP images confirms the above findings. NON-VASCULAR Lower chest: Minimal subsegmental scarring in the left greater than right lower lobe. Hepatobiliary: Borderline hepatic steatosis. No focal hepatic abnormality. Gallbladder is displaced anteriorly but no calcified gallstone or inflammation. No biliary dilatation. Pancreas: Unremarkable. No pancreatic ductal dilatation or surrounding inflammatory changes. Spleen: Normal in size without focal abnormality. Adrenals/Urinary Tract: Mild left adrenal thickening. No adrenal nodule. Mild symmetric perinephric edema which is nonspecific. No hydronephrosis. No renal calculi. Tiny hypodensities arising from the lower right kidney is too small to characterize but likely small cysts. Unremarkable urinary bladder. Stomach/Bowel: The stomach is distended with ingested material. Slightly high-density material in the stomach is present on both pre and post-contrast exam is not related to vasculature. There is no accumulation of contrast  within the GI tract to localize GI bleed. High-density within a right colonic diverticulum is seen on precontrast  imaging and not related to vascular extravasation. Occasional fluid-filled loops of small bowel without obstruction or inflammation. Appendectomy per history. The left colon is decompressed. There may be faint pericolonic stranding adjacent to the distal descending colon, for example series 5, image 57. No diverticulitis. Lymphatic: No abdominopelvic adenopathy. Reproductive: Status post hysterectomy. No adnexal masses. Other: Tiny fat containing umbilical hernia. No free air, free fluid, or intra-abdominal fluid collection. Musculoskeletal: There are no acute or suspicious osseous abnormalities. Multilevel degenerative change in the lumbar spine. IMPRESSION: VASCULAR 1. No accumulation of contrast in the GI tract to suggest GI bleed. 2. 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 No acute dissection or aortic findings. Recommend follow-up every 3 years. This recommendation follows ACR consensus guidelines: White Paper of the ACR Incidental Findings Committee II on Vascular Findings. J Am Coll Radiol 2013; 10:789-794. 3. Moderate thoracoabdominal aortic atherosclerosis. NON-VASCULAR 1. Questionable pericolonic fat stranding adjacent to the distal descending colon, may represent minimal colitis in the appropriate clinical setting. The left colon is decompressed and not well assessed. 2. Minimal colonic diverticulosis without focal diverticulitis. 3. Mild hepatic steatosis. Electronically Signed   By: Keith Rake M.D.   On: 04/16/2021 21:31    Assessment/Plan: Upper GI bleed from DU, presumed secondary to heavy NSAID use for back pain. S/p percutaneous coil embolization of a distal pancreaticoduodenal branch of the GDA as well as the entirety of the GDA.  Per RN, pt to receive more PLTs and PRBC IR following, no plans for any immediate repeat angio    LOS: 1 day   I spent a total of 20 minutes in face to face in clinical consultation,  greater than 50% of which was counseling/coordinating care for percutaneous coil embolization of a distal pancreaticoduodenal branch of the GDA as well as the entirety of the GDA.   Ascencion Dike PA-C 04/18/2021 12:26 PM

## 2021-04-18 NOTE — Progress Notes (Signed)
FPTS Interim Progress Note  S: Went to bedside with Dr. Chauncey Reading and Critical Care Team present. Patient still having bloody stools with clots. Currently off of blood pressors.  O: BP 103/61   Pulse (!) 119   Temp (!) 97.5 F (36.4 C) (Oral)   Resp 11   Ht '4\' 11"'$  (1.499 m)   Wt 84.6 kg   SpO2 99%   BMI 37.67 kg/m    General: Ill-appearing but non-toxic, awake, alert, responsive to questions Head: Normocephalic atraumatic Respiratory: chest rises symmetrically,  no increased work of breathing Neuro: No focal deficits Skin: No rashes or lesions visualized  A/P: S/p GDA embolization, pressor requirement, and massive transfusion protocol. Spoke with Critical Care team and patient will stay in ICU today given still passing bloody stools with clots (per patient) and had eventful past 24 hours.  Gerrit Heck, MD 04/18/2021, 7:49 AM PGY-1, [place] [facility] pager [phone]

## 2021-04-18 NOTE — Interval H&P Note (Signed)
History and Physical Interval Note:  04/18/2021 3:13 PM  [name]  has presented today for surgery, with the diagnosis of Upper GI bleed.  The various methods of treatment have been discussed with the patient and family. After consideration of risks, benefits and other options for treatment, the patient has consented to  Procedure(s): ESOPHAGOGASTRODUODENOSCOPY (EGD) (N/A) as a surgical intervention.  The patient's history has been reviewed, patient examined, no change in status, stable for surgery.  I have reviewed the patient's chart and labs.  Questions were answered to the patient's satisfaction.     Hazelene Doten D

## 2021-04-18 NOTE — Anesthesia Postprocedure Evaluation (Signed)
Anesthesia Post Note  Patient: [name]  Procedure(s) Performed: ESOPHAGOGASTRODUODENOSCOPY (EGD) RADIOLOGY WITH ANESTHESIA     Patient location during evaluation: PACU Anesthesia Type: General Level of consciousness: patient cooperative and awake Pain management: pain level controlled Vital Signs Assessment: post-procedure vital signs reviewed and stable Respiratory status: spontaneous breathing, nonlabored ventilation, respiratory function stable and patient connected to nasal cannula oxygen Cardiovascular status: blood pressure returned to baseline and stable Postop Assessment: no apparent nausea or vomiting Anesthetic complications: no   No notable events documented.  Last Vitals:  Vitals:   04/18/21 2230 04/18/21 2300  BP: (!) 93/40   Pulse: (!) 102   Resp: 16 17  Temp:    SpO2: 100%     Last Pain:  Vitals:   04/18/21 2000  TempSrc:   PainSc: Asleep                 Nandana Krolikowski

## 2021-04-18 NOTE — Op Note (Signed)
[facility] Patient Name: [name] Procedure Date : 04/18/2021 MRN: [id number] Attending MD: Carol Ada , MD Date of Birth: February 11, 1941 CSN: [id number] Age: 80 Admit Type: Inpatient Procedure:                Upper GI endoscopy Indications:              Hematochezia Providers:                Carol Ada, MD, Hinton Dyer, Burtis Junes, RN, Jerene Pitch                            Person Referring MD:              Medicines:                Fentanyl 50 micrograms IV, Midazolam 4 mg IV Complications:            No immediate complications. Estimated Blood Loss:     Estimated blood loss: none. Procedure:                Pre-Anesthesia Assessment:                           - Prior to the procedure, a History and Physical                            was performed, and patient medications and                            allergies were reviewed. The patient's tolerance of                            previous anesthesia was also reviewed. The risks                            and benefits of the procedure and the sedation                            options and risks were discussed with the patient.                            All questions were answered, and informed consent                            was obtained. Prior Anticoagulants: The patient has                            taken no previous anticoagulant or antiplatelet                            agents. ASA Grade Assessment: III - A patient with                            severe systemic disease. After reviewing the risks  and benefits, the patient was deemed in                            satisfactory condition to undergo the procedure.                           - Sedation was administered by an endoscopy nurse.                            The sedation level attained was moderate.                           After obtaining informed consent, the endoscope was                            passed under direct vision.  Throughout the                            procedure, the patient's blood pressure, pulse, and                            oxygen saturations were monitored continuously. The                            [id number] Olympus endoscope was introduced                            through the mouth, and advanced to the second part                            of duodenum. The upper GI endoscopy was                            accomplished without difficulty. The patient                            tolerated the procedure well. Scope In: Scope Out: Findings:      The esophagus was normal.      Two non-bleeding cratered and superficial gastric ulcers with no       stigmata of bleeding were found in the gastric antrum. The largest       lesion was 5 mm in largest dimension.      One non-bleeding superficial duodenal ulcer with a visible vessel was       found in the duodenal bulb. The lesion was 15 mm in largest dimension.       Area was successfully injected with 2 mL of a 1:10,000 solution of       epinephrine for drug delivery. Coagulation for tissue destruction using       bipolar probe was successful. Estimated blood loss: none. For       hemostasis, one hemostatic clip was successfully placed (MR       conditional). There was no bleeding at the end of the procedure.      A large superficial ulceration was noted at the junction between D1 and  D2 of the duodenum. A small visible vessel was identified. Two ml of       1:10,000 Epi was injected and then the vessel was ablated with BICAP.       Each application was for 5 seconds. After the first treatment a mild       amount of bleeding was precipitated, but the second and third       applications successfully ablated the vessel. A hemoclip was       successfully placed across the site as the ulcer was pliable enough to       close up the site. Impression:               - Normal esophagus.                           - Non-bleeding gastric  ulcers with no stigmata of                            bleeding.                           - Non-bleeding duodenal ulcer with a visible                            vessel. Injected. Treated with bipolar cautery.                            Clip (MR conditional) was placed.                           - No specimens collected. Recommendation:           - Return patient to ICU for ongoing care.                           - Clear liquid diet.                           - Continue present medications.                           - Monitor HGB and transfuse as necessary. Procedure Code(s):        --- Professional ---                           [id number], Esophagogastroduodenoscopy, flexible,                            transoral; with ablation of tumor(s), polyp(s), or                            other lesion(s) (includes pre- and post-dilation                            and guide wire passage, when performed)                           43255, 59, Esophagogastroduodenoscopy, flexible,  transoral; with control of bleeding, any method                           43236, 59, Esophagogastroduodenoscopy, flexible,                            transoral; with directed submucosal injection(s),                            any substance Diagnosis Code(s):        --- Professional ---                           K25.9, Gastric ulcer, unspecified as acute or                            chronic, without hemorrhage or perforation                           K26.4, Chronic or unspecified duodenal ulcer with                            hemorrhage                           K92.1, Melena (includes Hematochezia) CPT copyright 2019 American Medical Association. All rights reserved. The codes documented in this report are preliminary and upon coder review may  be revised to meet current compliance requirements. Carol Ada, MD Carol Ada, MD 04/18/2021 4:05:17 PM This report has been signed  electronically. Number of Addenda: 0

## 2021-04-19 ENCOUNTER — Encounter: Payer: HMO | Admitting: Physical Therapy

## 2021-04-19 ENCOUNTER — Encounter (HOSPITAL_COMMUNITY): Payer: Self-pay | Admitting: Gastroenterology

## 2021-04-19 LAB — PREPARE PLATELET PHERESIS
Unit division: 0
Unit division: 0

## 2021-04-19 LAB — BASIC METABOLIC PANEL
Anion gap: 4 — ABNORMAL LOW (ref 5–15)
BUN: 59 mg/dL — ABNORMAL HIGH (ref 8–23)
CO2: 22 mmol/L (ref 22–32)
Calcium: 8 mg/dL — ABNORMAL LOW (ref 8.9–10.3)
Chloride: 113 mmol/L — ABNORMAL HIGH (ref 98–111)
Creatinine, Ser: 1.4 mg/dL — ABNORMAL HIGH (ref 0.44–1.00)
GFR, Estimated: 39 mL/min — ABNORMAL LOW (ref >60)
Glucose, Bld: 132 mg/dL — ABNORMAL HIGH (ref 70–99)
Potassium: 4.7 mmol/L (ref 3.5–5.1)
Sodium: 139 mmol/L (ref 135–145)

## 2021-04-19 LAB — BPAM PLATELET PHERESIS
Blood Product Expiration Date: 202209082359
Blood Product Expiration Date: 202209092359
ISSUE DATE / TIME: 202209080108
ISSUE DATE / TIME: 202209081058
Unit Type and Rh: 6200
Unit Type and Rh: 6200

## 2021-04-19 LAB — URINE CULTURE: Culture: NO GROWTH

## 2021-04-19 LAB — GLUCOSE, CAPILLARY
Glucose-Capillary: 99 mg/dL (ref 70–99)
Glucose-Capillary: 83 mg/dL (ref 70–99)
Glucose-Capillary: 99 mg/dL (ref 70–99)
Glucose-Capillary: 124 mg/dL — ABNORMAL HIGH (ref 70–99)
Glucose-Capillary: 108 mg/dL — ABNORMAL HIGH (ref 70–99)
Glucose-Capillary: 71 mg/dL (ref 70–99)
Glucose-Capillary: 78 mg/dL (ref 70–99)

## 2021-04-19 LAB — HEMOGLOBIN AND HEMATOCRIT, BLOOD
HCT: 20 % — ABNORMAL LOW (ref 36.0–46.0)
HCT: 23.5 % — ABNORMAL LOW (ref 36.0–46.0)
HCT: 23.7 % — ABNORMAL LOW (ref 36.0–46.0)
HCT: 22.6 % — ABNORMAL LOW (ref 36.0–46.0)
Hemoglobin: 7 g/dL — ABNORMAL LOW (ref 12.0–15.0)
Hemoglobin: 8.1 g/dL — ABNORMAL LOW (ref 12.0–15.0)
Hemoglobin: 7.9 g/dL — ABNORMAL LOW (ref 12.0–15.0)
Hemoglobin: 7.8 g/dL — ABNORMAL LOW (ref 12.0–15.0)

## 2021-04-19 LAB — CBC
HCT: 19.7 % — ABNORMAL LOW (ref 36.0–46.0)
Hemoglobin: 6.8 g/dL — CL (ref 12.0–15.0)
MCH: 30.6 pg (ref 26.0–34.0)
MCHC: 34.5 g/dL (ref 30.0–36.0)
MCV: 88.7 fL (ref 80.0–100.0)
Platelets: 94 10*3/uL — ABNORMAL LOW (ref 150–400)
RBC: 2.22 MIL/uL — ABNORMAL LOW (ref 3.87–5.11)
RDW: 15.3 % (ref 11.5–15.5)
WBC: 12.8 10*3/uL — ABNORMAL HIGH (ref 4.0–10.5)
nRBC: 0.2 % (ref 0.0–0.2)

## 2021-04-19 LAB — RENAL FUNCTION PANEL
Albumin: 2.1 g/dL — ABNORMAL LOW (ref 3.5–5.0)
Anion gap: 3 — ABNORMAL LOW (ref 5–15)
BUN: 52 mg/dL — ABNORMAL HIGH (ref 8–23)
CO2: 23 mmol/L (ref 22–32)
Calcium: 7.8 mg/dL — ABNORMAL LOW (ref 8.9–10.3)
Chloride: 109 mmol/L (ref 98–111)
Creatinine, Ser: 1.21 mg/dL — ABNORMAL HIGH (ref 0.44–1.00)
GFR, Estimated: 46 mL/min — ABNORMAL LOW (ref >60)
Glucose, Bld: 108 mg/dL — ABNORMAL HIGH (ref 70–99)
Phosphorus: 3.4 mg/dL (ref 2.5–4.6)
Potassium: 4.3 mmol/L (ref 3.5–5.1)
Sodium: 135 mmol/L (ref 135–145)

## 2021-04-19 LAB — PREPARE RBC (CROSSMATCH)

## 2021-04-19 LAB — MAGNESIUM: Magnesium: 2.3 mg/dL (ref 1.7–2.4)

## 2021-04-19 LAB — PHOSPHORUS: Phosphorus: 4.2 mg/dL (ref 2.5–4.6)

## 2021-04-19 MED ORDER — DULOXETINE HCL 30 MG PO CPEP
30.0000 mg | ORAL_CAPSULE | Freq: Every day | ORAL | Status: DC
  Administered 2021-04-19 – 2021-04-21 (×3): 30 mg via ORAL
  Filled 2021-04-19 (×3): qty 1

## 2021-04-19 MED ORDER — SODIUM CHLORIDE 0.9% IV SOLUTION: Freq: Once | INTRAVENOUS | Status: AC

## 2021-04-19 NOTE — TOC Initial Note (Signed)
Transition of Care [facility]) - Initial/Assessment Note    Patient Details  Name: [name] MRN: [id number] Date of Birth: [birth year]  [facility] CM/SW Contact:    Verdell Carmine, RN Phone Number: 04/19/2021, 5:50 PM  Clinical Narrative:                 80 year old history of NSAID use for back pain, in with GI bleed, had messenteric embolization. Continues to require transfusion, having dark red stools and drops in HBG. ICU day 3, Will likely need HH, will wait on PT consult until bleeding resolved to make determination. CM will follow for needs    Barriers to Discharge: Continued Medical Work up   Patient Goals and CMS Choice        Expected Discharge Plan and Services         Living arrangements for the past 2 months: [place]                                      Prior Living Arrangements/Services Living arrangements for the past 2 months: [place] Lives with:: Self Patient language and need for interpreter reviewed:: Yes        Need for Family Participation in Patient Care: Yes (Comment) Care giver support system in place?: Yes (comment)   Criminal Activity/Legal Involvement Pertinent to Current Situation/Hospitalization: No - Comment as needed  Activities of Daily Living      Permission Sought/Granted                  Emotional Assessment         Alcohol / Substance Use: Not Applicable    Admission diagnosis:  GI bleed [K92.2] Acute GI bleeding [K92.2] Gastrointestinal hemorrhage, unspecified gastrointestinal hemorrhage type [K92.2] Patient Active Problem List   Diagnosis Date Noted   Hemorrhagic shock ([place]) 04/17/2021   Acute blood loss anemia 04/17/2021   Chronic pain syndrome    Hypotension due to hypovolemia    Acute upper GI bleed 04/16/2021   Polypharmacy 12/31/2020   Asthma exacerbation 11/28/2020   Itching of ear, bilateral 05/11/2020   Financial difficulties 11/16/2019    Osteoarthritis of knee, unspecified 06/13/2019   Rotator cuff syndrome of right shoulder 01/21/2019   Bladder prolapse, female, acquired 08/16/2018   Urinary incontinence 07/05/2018   Frequent falls 12/24/2017   Adjustment reaction with anxiety and depression 0000000   Ruptured silicone breast implant 12/08/2016   Cough 03/03/2016   Iron deficiency anemia due to chronic blood loss 10/31/2015   Chronic right-sided thoracic back pain 07/26/2015   Spinal stenosis of lumbar region 07/10/2015   Abdominal aortic atherosclerosis ([place]) 07/10/2015   UTI (urinary tract infection) 09/07/2013   Sleep apnea 05/22/2010   PERIPHERAL NEUROPATHY 02/06/2010   PULMONARY NODULE, SOLITARY 07/20/2009   Irritable bowel syndrome 11/12/2007   Non-insulin dependent type 2 diabetes mellitus ([place]) 10/08/2006   HYPERCHOLESTEROLEMIA 10/08/2006   HYPERTRIGLYCERIDEMIA 10/08/2006   Migraine headache 10/08/2006   CARPAL TUNNEL SYNDROME 10/08/2006   HYPERTENSION, BENIGN SYSTEMIC 10/08/2006   ASTHMA, PERSISTENT, MODERATE 10/08/2006   Reflux esophagitis 10/08/2006   Eczema 10/08/2006   Psoriasis 10/08/2006   Osteoarthritis of spine 10/08/2006   VERTIGO NOS OR DIZZINESS 10/08/2006   PCP:  Zenia Resides, MD Pharmacy:   [facility] ([facility]) [place], [place] - [address] [place] [address]  Phone: [phone] Fax: [phone]  [facility] [id number] - [address], [address] AT [facility] [address] [place] [address] Phone: [phone] Fax: [phone]  [facility] [place], [address] AT [address] [place] [address] Phone: [phone] Fax: [phone]     Social Determinants of Health (SDOH) Interventions    Readmission Risk Interventions No flowsheet data found.

## 2021-04-19 NOTE — Progress Notes (Signed)
Patient taken to the bedside commode to urinate. Patient is also still having maroon colored blood from the rectum. Total volume out was 192m but unsure of how much was urine versus blood. Will continue to monitor and have orders to recheck H/H sent prior to this occurrence.

## 2021-04-19 NOTE — Progress Notes (Addendum)
NAME:  [name], MRN:  [id number], DOB:  01/31/1941, LOS: 2 ADMISSION DATE:  04/16/2021, CONSULTATION DATE:  04/19/21 REFERRING MD:  Vanessa [place], CHIEF COMPLAINT:  GIB   History of Present Illness:  [name] is a 81 y.o. F with PMH of HTN, DM, macular degeneration, recent grief and chronic pain in the setting of the passing of her husband who presented to the ED  on 9/6 with complaints of bright red rectal bleeding with worsening back pain.  She is not on blood thinners or anti-platelet medications at baseline.  Had been taking NSAIDS for back pain.  Her initial Hgb was 7.4 and she was transfused, given IVF and protonix and and admitted to the family medicine service.    On 9/7 underwent EGD with findings of large amount of bright red blood in the entire stomach and was referred to IR.  She underwent post-mesenteric arteriogram and embolization of the GDA for duodenal bleeding.  Post-op continued to have dark red rectal bleeding and borderline low BP with Hgb down from 10.2 this afternoon to 6.5, therefore PCCM consulted.  Pt awake at the time of evaluation and continues to complain of primarily back pain. Has received 8 units PRBC's and 3 units FFP since admission with two more units PRBC's ordered   Pertinent  Medical History   has a past medical history of Asthma, Depression, Diabetes mellitus without complication ([place]), History of surgery on arm, Hypertension, Macular degeneration, Migraine, Pneumonia, and Urinary incontinence.   Significant Hospital Events: Including procedures, antibiotic start and stop dates in addition to other pertinent events   9/6 Presented with PRBPR and anemia, admitted to IMTS 9/7 EGD and IR embolization for  gastroduodenal artery bleed, continued maroon stools and borderline hypotension, to transfer to ICU 9/8 repeat EGD  Interim History / Subjective:   Remains off vasopressors since yesterday. Repeat EGD noted for gastric and duodenal ulcers.  She  received unit of blood overnight, repeat hemoglobin late this AM is 8.1g/dL.  Objective   Blood pressure (!) 169/77, pulse 99, temperature 98.1 F (36.7 C), temperature source Oral, resp. rate 18, height '4\' 11"'$  (1.499 m), weight 84.6 kg, SpO2 100 %.        Intake/Output Summary (Last 24 hours) at 04/19/2021 1200 Last data filed at 04/19/2021 1000 Gross per 24 hour  Intake 1571.86 ml  Output 1525 ml  Net 46.86 ml   Filed Weights   04/16/21 1754 04/17/21 2221  Weight: 76.2 kg 84.6 kg   General:  sleeping, ill appearing, pale HEENT: MM pale/moist, R IJ CVL Neuro: sleeping, but arouses to verbal stimuli CV: rrr, no murmur PULM:  clear to auscultation GI: soft, non-distended, bowel sounds present Extremities: cool/dry, left wrist Aline Skin: no rashes  Resolved Hospital Problem list   Hemorrhagic Shock Hyperkalemia  Assessment & Plan:    Acute blood loss anemia secondary to acute upper GI bleeding s/p EGD and GDA embolization by IR 9/7 and repeat EGD on 9/8 - IR and GI following, appreciate further recs - remains NPO except meds - PPI BID - continue serial H/H q6hrs - monitor closely for volume overload s/p transfused multiple products - check RBC scan if continues to bleed or have drop in H/H  Acute Kidney Injury Likely secondary to hypoperfusion in the setting of GIB P: - sCr 1.07-> 1.4 -Monitor  Leukocytosis  Possibly reactive, evidence of peri-colonic fat stranding on CT, consider developing colitis P: - UA with trace leukocytes, otherwise no bacteria or WBC,  monitor  - remains afebrile  - monitor fever curve, low threshold for empiric antibiotics  - CBC in am   Type 2 DM -continue SSI sensitive   Depression, Neuropathy -resume Cymbalta  HL -continue statin  Hx of Asthma  -no exacerbation, continue albuterol  Back pain, chronic - reports mostly thoracic, and that she had a mechanical fall 3 weeks ago with negative imaging since - continue with tylenol  and prn Oxy IR moderate pain, and fentanyl for severe pain.  Will add back home baclofen.    Best Practice (right click and "Reselect all SmartList Selections" daily)   Diet/type: NPO DVT prophylaxis: SCD GI prophylaxis: PPI Lines: Central line and yes and it is still needed Foley:  Yes, and it is still needed Code Status:  full code Last date of multidisciplinary goals of care discussion '[]'$   Labs   CBC: Recent Labs  Lab 04/16/21 1807 04/17/21 0950 04/17/21 1623 04/17/21 2350 04/18/21 0639 04/18/21 1630 04/18/21 2330 04/19/21 0111 04/19/21 1117  WBC 11.5* 18.3*  --  10.9* 14.7*  --   --  12.8*  --   NEUTROABS 9.0*  --   --   --   --   --   --   --   --   HGB 7.4* 7.9*   < > 9.8* 7.5* 7.9* 7.0* 6.8* 8.1*  HCT 25.1* 23.8*   < > 28.2* 21.7* 22.5* 20.0* 19.7* 23.5*  MCV 92.3 93.0  --  88.1 87.9  --   --  88.7  --   PLT 236 129*  --  53* 97*  --   --  94*  --    < > = values in this interval not displayed.    Basic Metabolic Panel: Recent Labs  Lab 04/16/21 1807 04/17/21 0950 04/17/21 1623 04/17/21 1740 04/17/21 2038 04/18/21 0149 04/18/21 0150 04/18/21 0639 04/18/21 1630 04/19/21 0110  NA 138 138   < > 142 142 139  --  140  --  139  K 5.3* 5.2*   < > 5.6* 5.4* 5.3*  --  5.3*  --  4.7  CL 110 116*  --   --  112* 117*  --  117*  --  113*  CO2 24 16*  --   --   --  21*  --  19*  --  22  GLUCOSE 131* 182*  --   --  175* 246*  --  235*  --  132*  BUN 48* 61*  --   --  44* 48*  --  51*  --  59*  CREATININE 1.13* 1.32*  --   --  1.00 1.07*  --  1.14*  --  1.40*  CALCIUM 8.4* 7.2*  --   --   --  8.6*  --  8.9  --  8.0*  MG  --   --   --   --   --   --  1.7  --   --  2.3  PHOS  --   --   --   --   --   --   --   --  5.1* 4.2   < > = values in this interval not displayed.   GFR: Estimated Creatinine Clearance: 31.3 mL/min (A) (by C-G formula based on SCr of 1.4 mg/dL (H)). Recent Labs  Lab 04/17/21 0950 04/17/21 2147 04/17/21 2350 04/18/21 0639 04/19/21 0111  WBC  18.3*  --  10.9* 14.7* 12.8*  LATICACIDVEN  --  1.3  --   --   --     Liver Function Tests: Recent Labs  Lab 04/16/21 1807 04/17/21 0950 04/18/21 0149  AST 30 78* 52*  ALT 19 57* 32  ALKPHOS 50 33* 23*  BILITOT 0.6 0.5 1.4*  PROT 4.7* 3.0* <3.0*  ALBUMIN 2.7* 1.7* 1.7*   Recent Labs  Lab 04/16/21 1807  LIPASE 27   No results for input(s): AMMONIA in the last 168 hours.  ABG    Component Value Date/Time   PHART 7.354 04/17/2021 1740   PCO2ART 37.7 04/17/2021 1740   PO2ART 411 (H) 04/17/2021 1740   HCO3 21.5 04/17/2021 1740   TCO2 20 (L) 04/17/2021 2038   ACIDBASEDEF 4.0 (H) 04/17/2021 1740   O2SAT 100.0 04/17/2021 1740     Coagulation Profile: Recent Labs  Lab 04/16/21 1807 04/18/21 1630  INR 1.3* 1.5*    Cardiac Enzymes: No results for input(s): CKTOTAL, CKMB, CKMBINDEX, TROPONINI in the last 168 hours.  HbA1C: HbA1c, POC (controlled diabetic range)  Date/Time Value Ref Range Status  04/04/2021 05:02 PM 6.3 0.0 - 7.0 % Final  02/28/2021 12:25 PM 6.2 0.0 - 7.0 % Final   Hgb A1c MFr Bld  Date/Time Value Ref Range Status  04/17/2021 09:50 AM 5.6 4.8 - 5.6 % Final    Comment:    (NOTE) Pre diabetes:          5.7%-6.4%  Diabetes:              >6.4%  Glycemic control for   <7.0% adults with diabetes     CBG: Recent Labs  Lab 04/18/21 1933 04/18/21 2331 04/19/21 0347 04/19/21 0729 04/19/21 1112  GLUCAP 169* 138* 99 83 99      Critical care time:  n/a    Freda Jackson, MD [place] [facility] Office: [phone]   See Amion for personal pager PCCM on call pager [phone] until 7pm. Please call Elink 7p-7a. [phone]

## 2021-04-19 NOTE — Progress Notes (Signed)
Subjective: No complaints.  She did have a melenic stool early in the AM.  Objective: Vital signs in last 24 hours: Temp:  [96.6 F (35.9 C)-98.7 F (37.1 C)] 97.4 F (36.3 C) (09/09 0733) Pulse Rate:  [82-138] 90 (09/09 0900) Resp:  [0-32] 12 (09/09 0900) BP: (91-146)/(36-114) 130/62 (09/09 0900) SpO2:  [76 %-100 %] 98 % (09/09 0900) Arterial Line BP: (73-159)/(40-118) 93/40 (09/08 2230) Last BM Date: 04/18/21  Intake/Output from previous day: 09/08 0701 - 09/09 0700 In: 1081.9 [I.V.:10; Blood:738; IV Piggyback:333.9] Out: 1025 [Urine:1025] Intake/Output this shift: Total I/O In: 500 [P.O.:500] Out: 325 [Urine:325]  General appearance: alert and no distress GI: soft, non-tender; bowel sounds normal; no masses,  no organomegaly  Lab Results: Recent Labs    04/17/21 2350 04/18/21 0639 04/18/21 1630 04/18/21 2330 04/19/21 0111  WBC 10.9* 14.7*  --   --  12.8*  HGB 9.8* 7.5* 7.9* 7.0* 6.8*  HCT 28.2* 21.7* 22.5* 20.0* 19.7*  PLT 53* 97*  --   --  94*   BMET Recent Labs    04/18/21 0149 04/18/21 0639 04/19/21 0110  NA 139 140 139  K 5.3* 5.3* 4.7  CL 117* 117* 113*  CO2 21* 19* 22  GLUCOSE 246* 235* 132*  BUN 48* 51* 59*  CREATININE 1.07* 1.14* 1.40*  CALCIUM 8.6* 8.9 8.0*   LFT Recent Labs    04/18/21 0149  PROT <3.0*  ALBUMIN 1.7*  AST 52*  ALT 32  ALKPHOS 23*  BILITOT 1.4*   PT/INR Recent Labs    04/16/21 1807 04/18/21 1630  LABPROT 16.5* 18.3*  INR 1.3* 1.5*   Hepatitis Panel No results for input(s): HEPBSAG, HCVAB, HEPAIGM, HEPBIGM in the last 72 hours. C-Diff No results for input(s): CDIFFTOX in the last 72 hours. Fecal Lactopherrin No results for input(s): FECLLACTOFRN in the last 72 hours.  Studies/Results: IR Angiogram Visceral Selective  Result Date: 04/18/2021 INDICATION: Acute upper GI bleeding secondary to duodenal ulcer. Please perform mesenteric arteriogram and percutaneous embolization as indicated. EXAM: 1. ULTRASOUND  GUIDANCE FOR ARTERIAL ACCESS 2. SELECTIVE CELIAC ARTERIOGRAM 3. SUB SELECTIVE COMMON HEPATIC ARTERIOGRAM 4. SUB SELECTIVE GASTRODUODENAL ARTERIOGRAM AND PERCUTANEOUS COIL EMBOLIZATION COMPARISON:  CTA abdomen and pelvis - 04/16/2021 MEDICATIONS: None ANESTHESIA/SEDATION: Patient remains intubated and sedated by the anesthesia department following endoscopy CONTRAST:  75 cc Omnipaque 240 FLUOROSCOPY TIME:  41 minutes, 18 seconds (253 mGy) COMPLICATIONS: None immediate. PROCEDURE: Informed consent was obtained from the patient following explanation of the procedure, risks, benefits and alternatives. All questions were addressed. A time out was performed prior to the initiation of the procedure. Maximal barrier sterile technique utilized including caps, mask, sterile gowns, sterile gloves, large sterile drape, hand hygiene, and Betadine prep. The right femoral head was marked fluoroscopically. Under sterile conditions and local anesthesia, the right common femoral artery access was performed with a micropuncture needle. Under direct ultrasound guidance, the right common femoral was accessed with a micropuncture kit. An ultrasound image was saved for documentation purposes. This allowed for placement of a 5-French vascular sheath. A limited arteriogram was performed through the side arm of the sheath confirming appropriate access within the right common femoral artery. Over a Bentson wire, a Mickelson catheter was advanced the caudal aspect of the thoracic aorta where was reformed, back bled and flushed. The Mickelson catheter was then utilized to select the celiac artery and a selective celiac arteriogram was performed With the use of a fathom 16 microwire, a STC microcatheter was utilized to select  the GDA and a selective gastroduodenal arteriogram was performed. The microcatheter was then utilized to select a distal pancreaticoduodenal artery and a sub selective arteriogram was performed. The distal  pancreaticoduodenal artery as well as the GDA was then percutaneously coil embolized with multiple overlapping 2 mm, 3 mm, 4 mm and 5 mm interlock soft and fiber coils to the origin of the GDA. Multiple selective arteriograms were performed during the embolization. The microcatheter was then retracted to the level of the common hepatic artery and a post embolization a shin selective common hepatic arteriogram was performed Images reviewed in the procedure was terminated All wires, catheters and sheaths were removed from the patient. Hemostasis was achieved at the right groin access site with deployment of an Angio-Seal closure device and manual compression. A dressing was applied. The patient tolerated the procedure well with plans for extubation immediately following completion of the procedure. FINDINGS: Selective celiac arteriogram demonstrates an ill-defined area of extravasation at the location of the descending duodenum (image 45, series 3), compatible with the findings preceding endoscopy. The ill-defined area of extravasation appears to be supplied from a distal branch of one of the pancreaticoduodenal arteries arising from the mid aspect of the GDA. Technically successful percutaneous coil embolization of a distal pancreaticoduodenal branch of the GDA as well as the entirety of the GDA. IMPRESSION: Technically successful percutaneous coil embolization of a distal pancreaticoduodenal branch of the GDA as well as the entirety of the GDA for acute upper GI bleeding secondary to duodenal ulcer. PLAN: - The patient is to remain flat for 4 hours with right leg straight. - The patient will likely experience several bloody bowel movements and may continue to require additional resuscitation (as she was bleeding both before and during the procedure), however ultimately I am hopeful she will stabilize in the coming days. - Repeat endoscopy may be performed at the discretion of the GI service as indicated.  Electronically Signed   By: Sandi Mariscal M.D.   On: 04/18/2021 10:01   IR Angiogram Selective Each Additional Vessel  Result Date: 04/18/2021 INDICATION: Acute upper GI bleeding secondary to duodenal ulcer. Please perform mesenteric arteriogram and percutaneous embolization as indicated. EXAM: 1. ULTRASOUND GUIDANCE FOR ARTERIAL ACCESS 2. SELECTIVE CELIAC ARTERIOGRAM 3. SUB SELECTIVE COMMON HEPATIC ARTERIOGRAM 4. SUB SELECTIVE GASTRODUODENAL ARTERIOGRAM AND PERCUTANEOUS COIL EMBOLIZATION COMPARISON:  CTA abdomen and pelvis - 04/16/2021 MEDICATIONS: None ANESTHESIA/SEDATION: Patient remains intubated and sedated by the anesthesia department following endoscopy CONTRAST:  75 cc Omnipaque 240 FLUOROSCOPY TIME:  41 minutes, 18 seconds (212 mGy) COMPLICATIONS: None immediate. PROCEDURE: Informed consent was obtained from the patient following explanation of the procedure, risks, benefits and alternatives. All questions were addressed. A time out was performed prior to the initiation of the procedure. Maximal barrier sterile technique utilized including caps, mask, sterile gowns, sterile gloves, large sterile drape, hand hygiene, and Betadine prep. The right femoral head was marked fluoroscopically. Under sterile conditions and local anesthesia, the right common femoral artery access was performed with a micropuncture needle. Under direct ultrasound guidance, the right common femoral was accessed with a micropuncture kit. An ultrasound image was saved for documentation purposes. This allowed for placement of a 5-French vascular sheath. A limited arteriogram was performed through the side arm of the sheath confirming appropriate access within the right common femoral artery. Over a Bentson wire, a Mickelson catheter was advanced the caudal aspect of the thoracic aorta where was reformed, back bled and flushed. The Mickelson catheter was then utilized to  select the celiac artery and a selective celiac arteriogram was  performed With the use of a fathom 16 microwire, a STC microcatheter was utilized to select the GDA and a selective gastroduodenal arteriogram was performed. The microcatheter was then utilized to select a distal pancreaticoduodenal artery and a sub selective arteriogram was performed. The distal pancreaticoduodenal artery as well as the GDA was then percutaneously coil embolized with multiple overlapping 2 mm, 3 mm, 4 mm and 5 mm interlock soft and fiber coils to the origin of the GDA. Multiple selective arteriograms were performed during the embolization. The microcatheter was then retracted to the level of the common hepatic artery and a post embolization a shin selective common hepatic arteriogram was performed Images reviewed in the procedure was terminated All wires, catheters and sheaths were removed from the patient. Hemostasis was achieved at the right groin access site with deployment of an Angio-Seal closure device and manual compression. A dressing was applied. The patient tolerated the procedure well with plans for extubation immediately following completion of the procedure. FINDINGS: Selective celiac arteriogram demonstrates an ill-defined area of extravasation at the location of the descending duodenum (image 45, series 3), compatible with the findings preceding endoscopy. The ill-defined area of extravasation appears to be supplied from a distal branch of one of the pancreaticoduodenal arteries arising from the mid aspect of the GDA. Technically successful percutaneous coil embolization of a distal pancreaticoduodenal branch of the GDA as well as the entirety of the GDA. IMPRESSION: Technically successful percutaneous coil embolization of a distal pancreaticoduodenal branch of the GDA as well as the entirety of the GDA for acute upper GI bleeding secondary to duodenal ulcer. PLAN: - The patient is to remain flat for 4 hours with right leg straight. - The patient will likely experience several bloody  bowel movements and may continue to require additional resuscitation (as she was bleeding both before and during the procedure), however ultimately I am hopeful she will stabilize in the coming days. - Repeat endoscopy may be performed at the discretion of the GI service as indicated. Electronically Signed   By: Sandi Mariscal M.D.   On: 04/18/2021 10:01   IR US Guide Vasc Access Right  Result Date: 04/18/2021 INDICATION: Acute upper GI bleeding secondary to duodenal ulcer. Please perform mesenteric arteriogram and percutaneous embolization as indicated. EXAM: 1. ULTRASOUND GUIDANCE FOR ARTERIAL ACCESS 2. SELECTIVE CELIAC ARTERIOGRAM 3. SUB SELECTIVE COMMON HEPATIC ARTERIOGRAM 4. SUB SELECTIVE GASTRODUODENAL ARTERIOGRAM AND PERCUTANEOUS COIL EMBOLIZATION COMPARISON:  CTA abdomen and pelvis - 04/16/2021 MEDICATIONS: None ANESTHESIA/SEDATION: Patient remains intubated and sedated by the anesthesia department following endoscopy CONTRAST:  75 cc Omnipaque 240 FLUOROSCOPY TIME:  41 minutes, 18 seconds (761 mGy) COMPLICATIONS: None immediate. PROCEDURE: Informed consent was obtained from the patient following explanation of the procedure, risks, benefits and alternatives. All questions were addressed. A time out was performed prior to the initiation of the procedure. Maximal barrier sterile technique utilized including caps, mask, sterile gowns, sterile gloves, large sterile drape, hand hygiene, and Betadine prep. The right femoral head was marked fluoroscopically. Under sterile conditions and local anesthesia, the right common femoral artery access was performed with a micropuncture needle. Under direct ultrasound guidance, the right common femoral was accessed with a micropuncture kit. An ultrasound image was saved for documentation purposes. This allowed for placement of a 5-French vascular sheath. A limited arteriogram was performed through the side arm of the sheath confirming appropriate access within the right  common femoral artery. Over a Bentson  wire, a Mickelson catheter was advanced the caudal aspect of the thoracic aorta where was reformed, back bled and flushed. The Mickelson catheter was then utilized to select the celiac artery and a selective celiac arteriogram was performed With the use of a fathom 16 microwire, a STC microcatheter was utilized to select the GDA and a selective gastroduodenal arteriogram was performed. The microcatheter was then utilized to select a distal pancreaticoduodenal artery and a sub selective arteriogram was performed. The distal pancreaticoduodenal artery as well as the GDA was then percutaneously coil embolized with multiple overlapping 2 mm, 3 mm, 4 mm and 5 mm interlock soft and fiber coils to the origin of the GDA. Multiple selective arteriograms were performed during the embolization. The microcatheter was then retracted to the level of the common hepatic artery and a post embolization a shin selective common hepatic arteriogram was performed Images reviewed in the procedure was terminated All wires, catheters and sheaths were removed from the patient. Hemostasis was achieved at the right groin access site with deployment of an Angio-Seal closure device and manual compression. A dressing was applied. The patient tolerated the procedure well with plans for extubation immediately following completion of the procedure. FINDINGS: Selective celiac arteriogram demonstrates an ill-defined area of extravasation at the location of the descending duodenum (image 45, series 3), compatible with the findings preceding endoscopy. The ill-defined area of extravasation appears to be supplied from a distal branch of one of the pancreaticoduodenal arteries arising from the mid aspect of the GDA. Technically successful percutaneous coil embolization of a distal pancreaticoduodenal branch of the GDA as well as the entirety of the GDA. IMPRESSION: Technically successful percutaneous coil embolization  of a distal pancreaticoduodenal branch of the GDA as well as the entirety of the GDA for acute upper GI bleeding secondary to duodenal ulcer. PLAN: - The patient is to remain flat for 4 hours with right leg straight. - The patient will likely experience several bloody bowel movements and may continue to require additional resuscitation (as she was bleeding both before and during the procedure), however ultimately I am hopeful she will stabilize in the coming days. - Repeat endoscopy may be performed at the discretion of the GI service as indicated. Electronically Signed   By: Sandi Mariscal M.D.   On: 04/18/2021 10:01   IR EMBO ART  VEN HEMORR LYMPH EXTRAV  INC GUIDE ROADMAPPING  Result Date: 04/18/2021 INDICATION: Acute upper GI bleeding secondary to duodenal ulcer. Please perform mesenteric arteriogram and percutaneous embolization as indicated. EXAM: 1. ULTRASOUND GUIDANCE FOR ARTERIAL ACCESS 2. SELECTIVE CELIAC ARTERIOGRAM 3. SUB SELECTIVE COMMON HEPATIC ARTERIOGRAM 4. SUB SELECTIVE GASTRODUODENAL ARTERIOGRAM AND PERCUTANEOUS COIL EMBOLIZATION COMPARISON:  CTA abdomen and pelvis - 04/16/2021 MEDICATIONS: None ANESTHESIA/SEDATION: Patient remains intubated and sedated by the anesthesia department following endoscopy CONTRAST:  75 cc Omnipaque 240 FLUOROSCOPY TIME:  41 minutes, 18 seconds (093 mGy) COMPLICATIONS: None immediate. PROCEDURE: Informed consent was obtained from the patient following explanation of the procedure, risks, benefits and alternatives. All questions were addressed. A time out was performed prior to the initiation of the procedure. Maximal barrier sterile technique utilized including caps, mask, sterile gowns, sterile gloves, large sterile drape, hand hygiene, and Betadine prep. The right femoral head was marked fluoroscopically. Under sterile conditions and local anesthesia, the right common femoral artery access was performed with a micropuncture needle. Under direct ultrasound guidance,  the right common femoral was accessed with a micropuncture kit. An ultrasound image was saved for documentation purposes. This  allowed for placement of a 5-French vascular sheath. A limited arteriogram was performed through the side arm of the sheath confirming appropriate access within the right common femoral artery. Over a Bentson wire, a Mickelson catheter was advanced the caudal aspect of the thoracic aorta where was reformed, back bled and flushed. The Mickelson catheter was then utilized to select the celiac artery and a selective celiac arteriogram was performed With the use of a fathom 16 microwire, a STC microcatheter was utilized to select the GDA and a selective gastroduodenal arteriogram was performed. The microcatheter was then utilized to select a distal pancreaticoduodenal artery and a sub selective arteriogram was performed. The distal pancreaticoduodenal artery as well as the GDA was then percutaneously coil embolized with multiple overlapping 2 mm, 3 mm, 4 mm and 5 mm interlock soft and fiber coils to the origin of the GDA. Multiple selective arteriograms were performed during the embolization. The microcatheter was then retracted to the level of the common hepatic artery and a post embolization a shin selective common hepatic arteriogram was performed Images reviewed in the procedure was terminated All wires, catheters and sheaths were removed from the patient. Hemostasis was achieved at the right groin access site with deployment of an Angio-Seal closure device and manual compression. A dressing was applied. The patient tolerated the procedure well with plans for extubation immediately following completion of the procedure. FINDINGS: Selective celiac arteriogram demonstrates an ill-defined area of extravasation at the location of the descending duodenum (image 45, series 3), compatible with the findings preceding endoscopy. The ill-defined area of extravasation appears to be supplied from a  distal branch of one of the pancreaticoduodenal arteries arising from the mid aspect of the GDA. Technically successful percutaneous coil embolization of a distal pancreaticoduodenal branch of the GDA as well as the entirety of the GDA. IMPRESSION: Technically successful percutaneous coil embolization of a distal pancreaticoduodenal branch of the GDA as well as the entirety of the GDA for acute upper GI bleeding secondary to duodenal ulcer. PLAN: - The patient is to remain flat for 4 hours with right leg straight. - The patient will likely experience several bloody bowel movements and may continue to require additional resuscitation (as she was bleeding both before and during the procedure), however ultimately I am hopeful she will stabilize in the coming days. - Repeat endoscopy may be performed at the discretion of the GI service as indicated. Electronically Signed   By: Sandi Mariscal M.D.   On: 04/18/2021 10:01    Medications: Scheduled:  sodium chloride   Intravenous Once   sodium chloride   Intravenous Once   Chlorhexidine Gluconate Cloth  6 each Topical Daily   DULoxetine  30 mg Oral Daily   insulin aspart  0-9 Units Subcutaneous Q4H   pantoprazole (PROTONIX) IV  40 mg Intravenous Q12H   rosuvastatin  20 mg Oral Daily   sodium chloride flush  10-40 mL Intracatheter Q12H   Continuous:  sodium chloride     norepinephrine (LEVOPHED) Adult infusion Stopped (04/18/21 0451)    Assessment/Plan: 1) Bleeding duodenal ulcer s/p coil embolization and BICAP with hemoclipping. 2) Anemia.   Her HGB is at 6.8 and she is receiving an transfusion.  This was not a significant drop from her prior value of 7.0.  Examination today does show that she has melenic stools.  It is not completely clear if she is continuing to bleed.  Plan: 1) Continue with PPI. 2) Follow HGB and transfuse as necessary. 3) If  bleeding persists a bleeding scan may need to be performed.  LOS: 2 days   Beren Yniguez D 04/19/2021,  10:03 AM

## 2021-04-19 NOTE — Progress Notes (Signed)
[facility] Progress Note Patient Name: [name] DOB: Nov 23, 1940 MRN: [id number]   Date of Service  04/19/2021  HPI/Events of Note  Acute blood loss anemia (hemoglobin 6.8 gm / dl.  eICU Interventions  Order to transfuse one unit PRBC entered.        Kerry Kass Marvie Brevik 04/19/2021, 3:19 AM

## 2021-04-19 NOTE — Progress Notes (Signed)
Patient was able to use the bedside commode to urinate. Patient is also still having maroon colored blood from the rectum. Total volume out was 482m but unsure of how much was urine versus blood. Will continue to monitor and have orders to recheck H/H at 1700.

## 2021-04-20 ENCOUNTER — Inpatient Hospital Stay (HOSPITAL_COMMUNITY): Payer: HMO

## 2021-04-20 DIAGNOSIS — R578 Other shock: Secondary | ICD-10-CM | POA: Diagnosis not present

## 2021-04-20 LAB — BPAM RBC
Blood Product Expiration Date: 202210022359
Blood Product Expiration Date: 202210052359
Blood Product Expiration Date: 202210062359
Blood Product Expiration Date: 202210062359
Blood Product Expiration Date: 202210062359
Blood Product Expiration Date: 202210062359
Blood Product Expiration Date: 202210062359
Blood Product Expiration Date: 202210062359
Blood Product Expiration Date: 202210062359
Blood Product Expiration Date: 202210062359
Blood Product Expiration Date: 202210062359
Blood Product Expiration Date: 202210062359
Blood Product Expiration Date: 202210092359
Blood Product Expiration Date: 202210112359
ISSUE DATE / TIME: 202209062209
ISSUE DATE / TIME: 202209070051
ISSUE DATE / TIME: 202209070314
ISSUE DATE / TIME: 202209071123
ISSUE DATE / TIME: 202209071326
ISSUE DATE / TIME: 202209071530
ISSUE DATE / TIME: 202209071530
ISSUE DATE / TIME: 202209071618
ISSUE DATE / TIME: 202209072101
ISSUE DATE / TIME: 202209072101
ISSUE DATE / TIME: 202209081056
ISSUE DATE / TIME: 202209090352
Unit Type and Rh: 5100
Unit Type and Rh: 5100
Unit Type and Rh: 5100
Unit Type and Rh: 5100
Unit Type and Rh: 5100
Unit Type and Rh: 5100
Unit Type and Rh: 5100
Unit Type and Rh: 5100
Unit Type and Rh: 5100
Unit Type and Rh: 5100
Unit Type and Rh: 5100
Unit Type and Rh: 5100
Unit Type and Rh: 5100
Unit Type and Rh: 5100

## 2021-04-20 LAB — TYPE AND SCREEN
ABO/RH(D): O POS
Antibody Screen: NEGATIVE
Unit division: 0
Unit division: 0
Unit division: 0
Unit division: 0
Unit division: 0
Unit division: 0
Unit division: 0
Unit division: 0
Unit division: 0
Unit division: 0
Unit division: 0
Unit division: 0
Unit division: 0
Unit division: 0

## 2021-04-20 LAB — BASIC METABOLIC PANEL
Anion gap: 3 — ABNORMAL LOW (ref 5–15)
BUN: 30 mg/dL — ABNORMAL HIGH (ref 8–23)
CO2: 25 mmol/L (ref 22–32)
Calcium: 7.7 mg/dL — ABNORMAL LOW (ref 8.9–10.3)
Chloride: 110 mmol/L (ref 98–111)
Creatinine, Ser: 1.02 mg/dL — ABNORMAL HIGH (ref 0.44–1.00)
GFR, Estimated: 56 mL/min — ABNORMAL LOW (ref >60)
Glucose, Bld: 106 mg/dL — ABNORMAL HIGH (ref 70–99)
Potassium: 4.1 mmol/L (ref 3.5–5.1)
Sodium: 138 mmol/L (ref 135–145)

## 2021-04-20 LAB — HEMOGLOBIN AND HEMATOCRIT, BLOOD
HCT: 22.5 % — ABNORMAL LOW (ref 36.0–46.0)
HCT: 24.5 % — ABNORMAL LOW (ref 36.0–46.0)
HCT: 25 % — ABNORMAL LOW (ref 36.0–46.0)
Hemoglobin: 7.4 g/dL — ABNORMAL LOW (ref 12.0–15.0)
Hemoglobin: 7.9 g/dL — ABNORMAL LOW (ref 12.0–15.0)
Hemoglobin: 8.2 g/dL — ABNORMAL LOW (ref 12.0–15.0)

## 2021-04-20 LAB — GLUCOSE, CAPILLARY
Glucose-Capillary: 112 mg/dL — ABNORMAL HIGH (ref 70–99)
Glucose-Capillary: 167 mg/dL — ABNORMAL HIGH (ref 70–99)
Glucose-Capillary: 61 mg/dL — ABNORMAL LOW (ref 70–99)
Glucose-Capillary: 63 mg/dL — ABNORMAL LOW (ref 70–99)
Glucose-Capillary: 74 mg/dL (ref 70–99)
Glucose-Capillary: 81 mg/dL (ref 70–99)
Glucose-Capillary: 90 mg/dL (ref 70–99)
Glucose-Capillary: 90 mg/dL (ref 70–99)

## 2021-04-20 MED ORDER — HYDROMORPHONE HCL 1 MG/ML IJ SOLN
0.2500 mg | Freq: Once | INTRAMUSCULAR | Status: AC
Start: 2021-04-20 — End: 2021-04-20
  Administered 2021-04-20: 0.25 mg via INTRAVENOUS
  Filled 2021-04-20: qty 0.5

## 2021-04-20 MED ORDER — FENTANYL CITRATE (PF) 100 MCG/2ML IJ SOLN: 25.0000 ug | Freq: Once | INTRAMUSCULAR | Status: DC

## 2021-04-20 MED ORDER — FENTANYL CITRATE (PF) 100 MCG/2ML IJ SOLN
50.0000 ug | Freq: Once | INTRAMUSCULAR | Status: DC
Start: 2021-04-20 — End: 2021-04-20

## 2021-04-20 MED ORDER — OXYCODONE HCL 5 MG PO TABS
10.0000 mg | ORAL_TABLET | Freq: Four times a day (QID) | ORAL | Status: DC
Start: 2021-04-20 — End: 2021-04-22
  Administered 2021-04-20 – 2021-04-22 (×8): 10 mg via ORAL
  Filled 2021-04-20 (×8): qty 2

## 2021-04-20 MED ORDER — CYCLOBENZAPRINE HCL 10 MG PO TABS
5.0000 mg | ORAL_TABLET | Freq: Once | ORAL | Status: AC
  Administered 2021-04-20: 5 mg via ORAL
  Filled 2021-04-20: qty 1

## 2021-04-20 MED ORDER — FENTANYL CITRATE (PF) 100 MCG/2ML IJ SOLN
25.0000 ug | Freq: Once | INTRAMUSCULAR | Status: DC
Start: 2021-04-20 — End: 2021-04-20

## 2021-04-20 MED ORDER — POLYETHYLENE GLYCOL 3350 17 G PO PACK
17.0000 g | PACK | Freq: Every day | ORAL | Status: DC
  Administered 2021-04-21 – 2021-04-26 (×5): 17 g
  Filled 2021-04-20 (×4): qty 1

## 2021-04-20 MED ORDER — METOPROLOL TARTRATE 5 MG/5ML IV SOLN
INTRAVENOUS | Status: AC
  Administered 2021-04-20: 5 mg via INTRAVENOUS
  Filled 2021-04-20: qty 5

## 2021-04-20 MED ORDER — FENTANYL CITRATE PF 50 MCG/ML IJ SOSY
25.0000 ug | PREFILLED_SYRINGE | INTRAMUSCULAR | Status: DC | PRN
  Administered 2021-04-21 – 2021-04-22 (×5): 25 ug via INTRAVENOUS
  Filled 2021-04-20 (×6): qty 1

## 2021-04-20 MED ORDER — FENTANYL CITRATE (PF) 100 MCG/2ML IJ SOLN
25.0000 ug | INTRAMUSCULAR | Status: DC | PRN
  Administered 2021-04-20 (×2): 25 ug via INTRAVENOUS
  Filled 2021-04-20 (×3): qty 2

## 2021-04-20 MED ORDER — ACETAMINOPHEN 325 MG PO TABS
650.0000 mg | ORAL_TABLET | Freq: Four times a day (QID) | ORAL | Status: DC
  Administered 2021-04-20 – 2021-04-26 (×26): 650 mg via ORAL
  Filled 2021-04-20 (×26): qty 2

## 2021-04-20 MED ORDER — BACLOFEN 10 MG PO TABS
10.0000 mg | ORAL_TABLET | Freq: Once | ORAL | Status: AC
  Administered 2021-04-20: 10 mg via ORAL
  Filled 2021-04-20: qty 1

## 2021-04-20 MED ORDER — DOCUSATE SODIUM 50 MG/5ML PO LIQD
100.0000 mg | Freq: Two times a day (BID) | ORAL | Status: DC
  Administered 2021-04-20 – 2021-04-25 (×9): 100 mg
  Filled 2021-04-20 (×11): qty 10

## 2021-04-20 MED ORDER — CLONAZEPAM 0.125 MG PO TBDP
0.1250 mg | ORAL_TABLET | Freq: Once | ORAL | Status: DC
  Filled 2021-04-20: qty 1

## 2021-04-20 MED ORDER — DEXTROSE 50 % IV SOLN
12.5000 g | Freq: Once | INTRAVENOUS | Status: AC
  Administered 2021-04-20: 12.5 g via INTRAVENOUS
  Filled 2021-04-20: qty 50

## 2021-04-20 MED ORDER — DEXMEDETOMIDINE HCL IN NACL 400 MCG/100ML IV SOLN
0.4000 ug/kg/h | INTRAVENOUS | Status: DC
  Filled 2021-04-20: qty 100

## 2021-04-20 MED ORDER — METOPROLOL TARTRATE 5 MG/5ML IV SOLN: 5.0000 mg | Freq: Once | INTRAVENOUS | Status: AC

## 2021-04-20 MED ORDER — LIDOCAINE 5 % EX PTCH
1.0000 | MEDICATED_PATCH | CUTANEOUS | Status: DC
  Administered 2021-04-20 – 2021-04-22 (×3): 1 via TRANSDERMAL
  Filled 2021-04-20 (×4): qty 1

## 2021-04-20 NOTE — Progress Notes (Addendum)
NAME:  [name], MRN:  [id number], DOB:  13-Jan-1941, LOS: 3 ADMISSION DATE:  04/16/2021, CONSULTATION DATE:  04/20/21 REFERRING MD:  Vanessa [place], CHIEF COMPLAINT:  GIB   History of Present Illness:  [name] is a 80 y.o. F with PMH of HTN, DM, macular degeneration, recent grief and chronic pain in the setting of the passing of her husband who presented to the ED  on 9/6 with complaints of bright red rectal bleeding with worsening back pain.  She is not on blood thinners or anti-platelet medications at baseline.  Had been taking NSAIDS for back pain.  Her initial Hgb was 7.4 and she was transfused, given IVF and protonix and and admitted to the family medicine service.    On 9/7 underwent EGD with findings of large amount of bright red blood in the entire stomach and was referred to IR.  She underwent post-mesenteric arteriogram and embolization of the GDA for duodenal bleeding.  Post-op continued to have dark red rectal bleeding and borderline low BP with Hgb down from 10.2 this afternoon to 6.5, therefore PCCM consulted.  Pt awake at the time of evaluation and continues to complain of primarily back pain. Has received 8 units PRBC's and 3 units FFP since admission with two more units PRBC's ordered   Pertinent  Medical History   has a past medical history of Asthma, Depression, Diabetes mellitus without complication ([place]), History of surgery on arm, Hypertension, Macular degeneration, Migraine, Pneumonia, and Urinary incontinence.   Significant Hospital Events: Including procedures, antibiotic start and stop dates in addition to other pertinent events   9/6 Presented with PRBPR and anemia, admitted to IMTS 9/7 EGD and IR embolization for  gastroduodenal artery bleed, continued maroon stools and borderline hypotension, to transfer to ICU 9/8 repeat EGD 9/9 remains off vasopressors. Repeat EGD noted for gastric and duodenal ulcers.  Transfused 1 unit of PRBC  Interim History / Subjective:    Last stool charted around 1700 yest evening, reportedly maroon colored mixed with urine H/H trend 8.1- 7.9- 7.8- 7.4 This morning complains of ongoing severe right lower back pain, cant get comfortable in bed, and anxious, tearful at times.  Denies any nausea, vomiting, or abd pain- dislikes the clear liquid diet Remains afebrile Normotensive, remains slightly tachycardic 100-110  Objective   Blood pressure (!) 127/57, pulse 81, temperature 97.7 F (36.5 C), temperature source Oral, resp. rate 14, height '4\' 11"'$  (1.499 m), weight 84.6 kg, SpO2 100 %.        Intake/Output Summary (Last 24 hours) at 04/20/2021 0854 Last data filed at 04/20/2021 0546 Gross per 24 hour  Intake 1480 ml  Output 2600 ml  Net -1120 ml   Filed Weights   04/16/21 1754 04/17/21 2221  Weight: 76.2 kg 84.6 kg   General:  Elderly female sitting up in bed, restless and anxious HEENT: MM pale/moist Neuro: Alert, oriented, MAE CV: ST, no murmur, R IJ CVL site wnl PULM:  non labored, on room air, CTA GI: soft, obese, +bs, purwick Extremities: warm/dry Skin: no rashes  MSK: pain on palpation to right low lumbar area, no spinal tenderness  Resolved Hospital Problem list   Hemorrhagic Shock Hyperkalemia  Assessment & Plan:   Acute blood loss anemia secondary to acute upper GI bleeding s/p EGD and GDA embolization by IR 9/7 and repeat EGD on 9/8 - GI following, appreciate recs.  If repeat H/H remains stable and no further evidence of bleeding, plans to advance diet to soft diet -  trending H/H q 5 - continue PPI BID - if rebleeds, will need RBC scan  - if H/H / BMET remains stable and no further evidence of bleeding, will place 2 PIVs, d/c CVL, and likely transfer to PCU/ TRH  Acute Kidney Injury Likely secondary to hypoperfusion in the setting of GIB P: - pending am BMET - monitor for urinary retention, bladder scan as needed.  Continue purwick  Leukocytosis  Possibly reactive, evidence of  peri-colonic fat stranding on CT, consider developing colitis P: - UA with trace leukocytes, otherwise no bacteria or WBC, monitor  - remains afebrile  - monitor clinically  - CBC in am   Type 2 DM -CBG q 4, add SSI if > 180  Depression, Neuropathy - continue Cymbalta  HL -continue statin  Hx of Asthma  -no exacerbation, continue albuterol  Back pain, chronic - reports mostly thoracic, and that she had a mechanical fall 3 weeks ago with negative imaging since.   - today pinpoints pain to right lower lumbar area, similar to the pain she chronically has at home.   - continue tylenol prn, oxy IR for moderate, and fentanyl for severe - will give low dose hydromorphine 0.'25mg'$  x 1 to get pain under control - will add lidoderm patch to area (has outpt lido injections for muscular pain) - get OOB    Best Practice (right click and "Reselect all SmartList Selections" daily)   Diet/type: clear liquids DVT prophylaxis: SCD GI prophylaxis: PPI Lines: Central line and yes and it is still needed Foley:  N/A Code Status:  full code Last date of multidisciplinary goals of care discussion; patient updated at bedside on plan of care.    Labs   CBC: Recent Labs  Lab 04/16/21 1807 04/17/21 0950 04/17/21 1623 04/17/21 2350 04/18/21 0639 04/18/21 1630 04/19/21 0111 04/19/21 1117 04/19/21 1658 04/19/21 2248 04/20/21 0456  WBC 11.5* 18.3*  --  10.9* 14.7*  --  12.8*  --   --   --   --   NEUTROABS 9.0*  --   --   --   --   --   --   --   --   --   --   HGB 7.4* 7.9*   < > 9.8* 7.5*   < > 6.8* 8.1* 7.9* 7.8* 7.4*  HCT 25.1* 23.8*   < > 28.2* 21.7*   < > 19.7* 23.5* 23.7* 22.6* 22.5*  MCV 92.3 93.0  --  88.1 87.9  --  88.7  --   --   --   --   PLT 236 129*  --  53* 97*  --  94*  --   --   --   --    < > = values in this interval not displayed.    Basic Metabolic Panel: Recent Labs  Lab 04/17/21 0950 04/17/21 1623 04/17/21 2038 04/18/21 0149 04/18/21 0150 04/18/21 0639  04/18/21 1630 04/19/21 0110 04/19/21 1117  NA 138   < > 142 139  --  140  --  139 135  K 5.2*   < > 5.4* 5.3*  --  5.3*  --  4.7 4.3  CL 116*  --  112* 117*  --  117*  --  113* 109  CO2 16*  --   --  21*  --  19*  --  22 23  GLUCOSE 182*  --  175* 246*  --  235*  --  132* 108*  BUN 61*  --  44* 48*  --  51*  --  59* 52*  CREATININE 1.32*  --  1.00 1.07*  --  1.14*  --  1.40* 1.21*  CALCIUM 7.2*  --   --  8.6*  --  8.9  --  8.0* 7.8*  MG  --   --   --   --  1.7  --   --  2.3  --   PHOS  --   --   --   --   --   --  5.1* 4.2 3.4   < > = values in this interval not displayed.   GFR: Estimated Creatinine Clearance: 36.2 mL/min (A) (by C-G formula based on SCr of 1.21 mg/dL (H)). Recent Labs  Lab 04/17/21 0950 04/17/21 2147 04/17/21 2350 04/18/21 0639 04/19/21 0111  WBC 18.3*  --  10.9* 14.7* 12.8*  LATICACIDVEN  --  1.3  --   --   --     Liver Function Tests: Recent Labs  Lab 04/16/21 1807 04/17/21 0950 04/18/21 0149 04/19/21 1117  AST 30 78* 52*  --   ALT 19 57* 32  --   ALKPHOS 50 33* 23*  --   BILITOT 0.6 0.5 1.4*  --   PROT 4.7* 3.0* <3.0*  --   ALBUMIN 2.7* 1.7* 1.7* 2.1*   Recent Labs  Lab 04/16/21 1807  LIPASE 27   No results for input(s): AMMONIA in the last 168 hours.  ABG    Component Value Date/Time   PHART 7.354 04/17/2021 1740   PCO2ART 37.7 04/17/2021 1740   PO2ART 411 (H) 04/17/2021 1740   HCO3 21.5 04/17/2021 1740   TCO2 20 (L) 04/17/2021 2038   ACIDBASEDEF 4.0 (H) 04/17/2021 1740   O2SAT 100.0 04/17/2021 1740     Coagulation Profile: Recent Labs  Lab 04/16/21 1807 04/18/21 1630  INR 1.3* 1.5*    Cardiac Enzymes: No results for input(s): CKTOTAL, CKMB, CKMBINDEX, TROPONINI in the last 168 hours.  HbA1C: HbA1c, POC (controlled diabetic range)  Date/Time Value Ref Range Status  04/04/2021 05:02 PM 6.3 0.0 - 7.0 % Final  02/28/2021 12:25 PM 6.2 0.0 - 7.0 % Final   Hgb A1c MFr Bld  Date/Time Value Ref Range Status  04/17/2021  09:50 AM 5.6 4.8 - 5.6 % Final    Comment:    (NOTE) Pre diabetes:          5.7%-6.4%  Diabetes:              >6.4%  Glycemic control for   <7.0% adults with diabetes     CBG: Recent Labs  Lab 04/19/21 2303 04/19/21 2321 04/20/21 0009 04/20/21 0344 04/20/21 0722  GLUCAP 71 78 112* 74 81      Critical care time:  n/a     Kennieth Rad, ACNP [facility] 04/20/2021, 8:54 AM  See Shea Evans for pager If no response to pager, please call PCCM consult pager After 7:00 pm call Elink

## 2021-04-20 NOTE — Progress Notes (Addendum)
   Called by RN r/t patient still very restless in bed complaining of ongoing back pain and now in ST/ SVT.  Blood pressure (!) 114/62 (72), pulse (!) 161, temperature 97.7 F (36.5 C), temperature source Oral, resp. rate 16, height '4\' 11"'$  (1.499 m), weight 84.6 kg, SpO2 92 %.   Bladder scan performed showing 70 ml in bladder.   On exam, she awake, appropriate but constantly rolling back and forth saying she can not get comfortable.  Denies any nausea or abd pain.  Abd remains soft and non tender.  States this is her chronic back pain but so far she has not responded to earlier interventions to control her pain this morning (baclofen, oxy IR, fentanyl 110mg, and dilaudid 0.'25mg'$ ).  Her discomfort/ pain seems disproportionate to her clinical exam.   On palpation of her back, she now endorses upper right back pain- mid thoracic but area remains soft without evidence of bruising, etc which is different from earlier this morning.   BMET pending.  H/H reassuring at 7.9/ 24.5   Patient states she fell 3 weeks ago, mechanical, after tripping. Had negative plan films.  Denies hitting her head.  States that she has been in a flair since and why she took all those NSAIDs.  Plan S/p additional fentanyl 25 mcg.  She is finally starting to get more comfortable but her heart rate remains elevated.  BP remains stable therefore will give a dose of lopressor.  I do feel this warrants further imaging with CT chest/ abd/ pelvis. Will proceed with imaging.  She may need precedex to help her remain still for imaging.     Plan discussed with Dr. MVaughan Browner    Addendum: [id number] CT chest/ abd/ pelvis reviewed.  No acute findings.  Did note some diffuse subcutaneous fat stranding in the bilateral flanks, buttocks and upper thighs that may warrant further investigation, possibly with mri, and 724mRML pulmonary nodule that will need f/u CT in 6-12 months.    She remains comfortable and hemodynamically stable.  Will  transfer to PCU and Family practice medicine will pick up for primary care on 9/11.    BrKennieth RadACNP [facility] 04/20/2021, 12:32 PM  See Amion for pager If no response to pager, please call PCCM consult pager After 7:00 pm call Elink

## 2021-04-20 NOTE — Progress Notes (Addendum)
[facility] Progress Note Covering for Dr. Collene Mares and Dr. Benson Norway   CC:  GI bleed  Subjective:  She is anxious this morning. No CP or SOB. She is tolerating a clear liquid diet with mild nausea, dislikes sweet juice and jello. No vomiting. Her RN reported last BM was around 1700 yesterday evening, not sure if stool was melenic or not. No BM or rectal bleeding overnight or thus far this am.    Objective:   EGD 04/18/2021: - Normal esophagus. - Non-bleeding gastric ulcers with no stigmata of bleeding. - Non-bleeding duodenal ulcer with a visible vessel. Injected. Treated with bipolar cautery. Clip (MR conditional) was placed. - No specimens collected  EGD 907/2022: Normal appearing, widely patent esophagus and GEJ. - Large amount of red blood in the entire stomach. - Large blood clots in the duodenal bulb. - No specimens collected  Vital signs in last 24 hours: Temp:  [97.6 F (36.4 C)-98.1 F (36.7 C)] 98.1 F (36.7 C) (09/10 0345) Pulse Rate:  [78-99] 81 (09/10 0700) Resp:  [12-24] 14 (09/10 0700) BP: (109-189)/(49-112) 127/57 (09/10 0700) SpO2:  [88 %-100 %] 100 % (09/10 0700) Last BM Date: 04/19/21 General:   Alert 80 year old female in NAD. Heart: Tachycardic. No murmur.  Pulm: Breath sounds clear throughout. On oxygen 3L [place]. Abdomen: Soft, nontender, nondistended. Positive bowel sounds x 4 quadrants.  Extremities:  Upper and lower extremities with 1+ - 2+ edema. Neurologic:  Alert and  oriented x 4. Speech is clear. Moves all extremities equally.  Psych:  Alert and cooperative. Anxious.   Intake/Output from previous day: 09/09 0701 - 09/10 0700 In: 1980 [P.O.:1980] Out: 2925 [Urine:2925] Intake/Output this shift: No intake/output data recorded.  Lab Results: Recent Labs    04/17/21 2350 04/18/21 0639 04/18/21 1630 04/19/21 0111 04/19/21 1117 04/19/21 1658 04/19/21 2248 04/20/21 0456  WBC 10.9* 14.7*  --  12.8*  --   --   --   --   HGB  9.8* 7.5*   < > 6.8*   < > 7.9* 7.8* 7.4*  HCT 28.2* 21.7*   < > 19.7*   < > 23.7* 22.6* 22.5*  PLT 53* 97*  --  94*  --   --   --   --    < > = values in this interval not displayed.   BMET Recent Labs    04/18/21 0639 04/19/21 0110 04/19/21 1117  NA 140 139 135  K 5.3* 4.7 4.3  CL 117* 113* 109  CO2 19* 22 23  GLUCOSE 235* 132* 108*  BUN 51* 59* 52*  CREATININE 1.14* 1.40* 1.21*  CALCIUM 8.9 8.0* 7.8*   LFT Recent Labs    04/18/21 0149 04/19/21 1117  PROT <3.0*  --   ALBUMIN 1.7* 2.1*  AST 52*  --   ALT 32  --   ALKPHOS 23*  --   BILITOT 1.4*  --    PT/INR Recent Labs    04/18/21 1630  LABPROT 18.3*  INR 1.5*   Hepatitis Panel No results for input(s): HEPBSAG, HCVAB, HEPAIGM, HEPBIGM in the last 72 hours.  No results found.  Assessment / Plan:  73) 80 year old female admitted to the hospital 9/6 secondary to UGI bleed and hemorrhagic shock.  Admission Hg 7.4, initially transfused 3 units of PRBCs. S/P EGD 9/7 showed a large amount of blood in the stomach and large clots in the duodenal bulb repeat EGD 9/8 identified nonbleeding gastric ulcers and a  nonbleeding duodenal ulcer with a visible vessel which was injected and a clip was placed.  She continued to demonstrate active GI bleeding -> S/P coil embolization of a distal pancreaticoduodenal branch of the GDA and entirety of the GDA per IR on 9/7. Transfused 12 units or PRBCs, FFP x 3 and Platelets x 2. Hg today 7.4. No further melena overnight or thus far this am. Mildly tachycardic (HR low 100's), overall hemodynamically stable. Anxious.  -If active bleeding recurs consider a stat tagged red blood cell scan -Continue to monitor H&H Q 6 hours -Transfuse for hemoglobin < 7 -Await 11am H/H result, if stable and no obvious active bleeding will advance to a soft diet  -Continue Pantoprazole '40mg'$  IV bid -Eventual repeat EGD to assess for ulcer healing  -Await further recommendations from Dr. Lyndel Safe   2) AKI,  improving. Cr 1.40 -> 1.21.  -BMP with next H/H draw   3) Anxiety  -RN to contact medical team for anxiety management      Principal Problem:   Hemorrhagic shock ([place]) Active Problems:   Reflux esophagitis   Chronic right-sided thoracic back pain   Acute upper GI bleed   Acute blood loss anemia     LOS: 3 days   Noralyn Pick  04/20/2021, 7:35 AM   Attending physician's note   I have taken an interval history, reviewed the chart and examined the patient. I agree with the Advanced Practitioner's note, impression and recommendations.   Massive UGI bleed d/t NSAID DU s/p  BICAP/hemoclip 9/8 followed by GDA/PDA coil embolization. S/P 11 U PRBC. No further bleeding. Hb 7.4 Having considerable back pain.   Plan: -Advance diet to soft -Trend CBC. Keep Hb>7 -Continue IV Protonix -If any further bleeding, CTA or RBC scan -FU with DR Benson Norway as outpt.    Carmell Austria, MD [facility] [phone]

## 2021-04-21 DIAGNOSIS — G8929 Other chronic pain: Secondary | ICD-10-CM

## 2021-04-21 DIAGNOSIS — M546 Pain in thoracic spine: Secondary | ICD-10-CM

## 2021-04-21 DIAGNOSIS — R578 Other shock: Secondary | ICD-10-CM | POA: Diagnosis not present

## 2021-04-21 LAB — BASIC METABOLIC PANEL
Anion gap: 5 (ref 5–15)
BUN: 23 mg/dL (ref 8–23)
CO2: 25 mmol/L (ref 22–32)
Calcium: 7.9 mg/dL — ABNORMAL LOW (ref 8.9–10.3)
Chloride: 110 mmol/L (ref 98–111)
Creatinine, Ser: 0.94 mg/dL (ref 0.44–1.00)
GFR, Estimated: >60 mL/min (ref >60)
Glucose, Bld: 91 mg/dL (ref 70–99)
Potassium: 4.1 mmol/L (ref 3.5–5.1)
Sodium: 140 mmol/L (ref 135–145)

## 2021-04-21 LAB — GLUCOSE, CAPILLARY
Glucose-Capillary: 57 mg/dL — ABNORMAL LOW (ref 70–99)
Glucose-Capillary: 123 mg/dL — ABNORMAL HIGH (ref 70–99)
Glucose-Capillary: 69 mg/dL — ABNORMAL LOW (ref 70–99)
Glucose-Capillary: 133 mg/dL — ABNORMAL HIGH (ref 70–99)
Glucose-Capillary: 112 mg/dL — ABNORMAL HIGH (ref 70–99)
Glucose-Capillary: 149 mg/dL — ABNORMAL HIGH (ref 70–99)

## 2021-04-21 LAB — CBC
HCT: 23.2 % — ABNORMAL LOW (ref 36.0–46.0)
HCT: 24 % — ABNORMAL LOW (ref 36.0–46.0)
Hemoglobin: 7.6 g/dL — ABNORMAL LOW (ref 12.0–15.0)
Hemoglobin: 7.8 g/dL — ABNORMAL LOW (ref 12.0–15.0)
MCH: 30.9 pg (ref 26.0–34.0)
MCH: 30.8 pg (ref 26.0–34.0)
MCHC: 32.8 g/dL (ref 30.0–36.0)
MCHC: 32.5 g/dL (ref 30.0–36.0)
MCV: 94.3 fL (ref 80.0–100.0)
MCV: 94.9 fL (ref 80.0–100.0)
Platelets: 87 10*3/uL — ABNORMAL LOW (ref 150–400)
Platelets: 108 10*3/uL — ABNORMAL LOW (ref 150–400)
RBC: 2.46 MIL/uL — ABNORMAL LOW (ref 3.87–5.11)
RBC: 2.53 MIL/uL — ABNORMAL LOW (ref 3.87–5.11)
RDW: 15.1 % (ref 11.5–15.5)
RDW: 14.8 % (ref 11.5–15.5)
WBC: 5.3 10*3/uL (ref 4.0–10.5)
WBC: 7.4 10*3/uL (ref 4.0–10.5)
nRBC: 1.1 % — ABNORMAL HIGH (ref 0.0–0.2)
nRBC: 0.5 % — ABNORMAL HIGH (ref 0.0–0.2)

## 2021-04-21 LAB — HEMOGLOBIN AND HEMATOCRIT, BLOOD
HCT: 22.6 % — ABNORMAL LOW (ref 36.0–46.0)
Hemoglobin: 7.4 g/dL — ABNORMAL LOW (ref 12.0–15.0)

## 2021-04-21 LAB — TROPONIN I (HIGH SENSITIVITY)
Troponin I (High Sensitivity): 46 ng/L — ABNORMAL HIGH (ref <18)
Troponin I (High Sensitivity): 54 ng/L — ABNORMAL HIGH (ref <18)
Troponin I (High Sensitivity): 59 ng/L — ABNORMAL HIGH (ref <18)
Troponin I (High Sensitivity): 55 ng/L — ABNORMAL HIGH (ref <18)

## 2021-04-21 MED ORDER — DULOXETINE HCL 30 MG PO CPEP
30.0000 mg | ORAL_CAPSULE | Freq: Once | ORAL | Status: AC
  Administered 2021-04-21: 30 mg via ORAL
  Filled 2021-04-21: qty 1

## 2021-04-21 MED ORDER — DULOXETINE HCL 60 MG PO CPEP
60.0000 mg | ORAL_CAPSULE | Freq: Every day | ORAL | Status: DC
  Administered 2021-04-22 – 2021-04-26 (×5): 60 mg via ORAL
  Filled 2021-04-21 (×5): qty 1

## 2021-04-21 MED ORDER — HYDROXYZINE HCL 10 MG PO TABS
10.0000 mg | ORAL_TABLET | Freq: Once | ORAL | Status: AC
  Administered 2021-04-21: 10 mg via ORAL
  Filled 2021-04-21: qty 1

## 2021-04-21 NOTE — Progress Notes (Signed)
Progress Note    ASSESSMENT AND PLAN:   Massive UGI bleed d/t NSAID DU s/p  BICAP/hemoclip 9/8 followed by GDA/PDA coil embolization. S/P 11 U PRBC. No further bleeding. Hb 7.8 (stable)  Plan: -Trend CBC for now. -Would hold off on any intervention unless continued bleeding.  If any brisk bleeding, CTA. -Dr. Benson Norway taking over tomorrow.      SUBJECTIVE  Patient was seen earlier this morning.  Had 1 episode of maroonish stool-family showed it to me.  No further bleeding. No abdominal pain  Has back pain.      OBJECTIVE:     Vital signs in last 24 hours: Temp:  [97.6 F (36.4 C)-98.7 F (37.1 C)] 97.8 F (36.6 C) (09/11 1315) Pulse Rate:  [82-129] 104 (09/11 1400) Resp:  [11-24] 20 (09/11 1315) BP: (94-183)/(51-94) 152/62 (09/11 1400) SpO2:  [83 %-100 %] 98 % (09/11 1315) Weight:  [86 kg] 86 kg (09/10 2033) Last BM Date: 04/19/21 General:   Alert, still has back pain. EENT:  Normal hearing, non icteric sclera, conjunctive pink.  Abdomen:  Soft, nondistended, nontender.  Normal bowel sounds,.       Neurologic:  Alert and  oriented x4;  grossly normal neurologically. Psych:  Pleasant, cooperative.  Normal mood and affect.   Intake/Output from previous day: 09/10 0701 - 09/11 0700 In: 840 [P.O.:840] Out: 1300 [Urine:1300] Intake/Output this shift: Total I/O In: 960 [P.O.:960] Out: 300 [Urine:300]  Lab Results: Recent Labs    04/19/21 0111 04/19/21 1117 04/20/21 1730 04/21/21 0036 04/21/21 1128  WBC 12.8*  --   --  5.3 7.4  HGB 6.8*   < > 8.2* 7.6*  7.4* 7.8*  HCT 19.7*   < > 25.0* 23.2*  22.6* 24.0*  PLT 94*  --   --  87* 108*   < > = values in this interval not displayed.   BMET Recent Labs    04/19/21 1117 04/20/21 1121 04/21/21 0036  NA 135 138 140  K 4.3 4.1 4.1  CL 109 110 110  CO2 '23 25 25  '$ GLUCOSE 108* 106* 91  BUN 52* 30* 23  CREATININE 1.21* 1.02* 0.94  CALCIUM 7.8* 7.7* 7.9*   LFT Recent Labs    04/19/21 1117   ALBUMIN 2.1*   PT/INR No results for input(s): LABPROT, INR in the last 72 hours. Hepatitis Panel No results for input(s): HEPBSAG, HCVAB, HEPAIGM, HEPBIGM in the last 72 hours.  CT CHEST ABDOMEN PELVIS WO CONTRAST  Result Date: 04/20/2021 CLINICAL DATA:  Severe back pain post fall. EXAM: CT CHEST, ABDOMEN AND PELVIS WITHOUT CONTRAST TECHNIQUE: Multidetector CT imaging of the chest, abdomen and pelvis was performed following the standard protocol without IV contrast. COMPARISON:  October 08, 2015 FINDINGS: CT CHEST FINDINGS Cardiovascular: Right internal jugular approach central venous catheter terminates at the cavoatrial junction. Normal heart size. No pericardial effusion. Mediastinum/Nodes: No enlarged mediastinal, hilar, or axillary lymph nodes. Thyroid gland, trachea, and esophagus demonstrate no significant findings. Lungs/Pleura: 7 mm solid pulmonary nodule in the right middle lobe. Lungs otherwise clear. Musculoskeletal: No chest wall mass or suspicious bone lesions identified. CT ABDOMEN PELVIS FINDINGS Hepatobiliary: Small amount of perihepatic free fluid tracking along the diaphragm of the liver and gallbladder. Pancreas: Unremarkable. No pancreatic ductal dilatation or surrounding inflammatory changes. Spleen: Normal in size without focal abnormality. Adrenals/Urinary Tract: Adrenal glands are unremarkable. Kidneys are normal, without renal calculi, focal lesion, or hydronephrosis. Bladder is unremarkable. Stomach/Bowel: Stomach is within normal limits.  No evidence of appendicitis. No evidence of bowel wall thickening, distention, or inflammatory changes. Vascular/Lymphatic: Aortic atherosclerosis. Again seen is minimal aneurysmal dilation of the infrarenal aorta with a chronic short segment dissection flap, eccentric to the left. No enlarged abdominal or pelvic lymph nodes. Sequela of coil embolization of branches of GDA. Reproductive: Status post hysterectomy. No adnexal masses. Other: No  abdominal wall hernia or abnormality. Small amount of free fluid in the right pericolic gutter. Musculoskeletal: No acute or significant osseous findings. Diffuse subcutaneous fat stranding in the bilateral flanks, buttocks and upper thighs. IMPRESSION: 1. No evidence of acute traumatic injury to the chest. 2. Small amount of perihepatic free fluid tracking along the liver dome and gallbladder, etiology uncertain. Small amount of free fluid in the right pericolic gutter. 3. Diffuse subcutaneous fat stranding in the bilateral flanks, buttocks and upper thighs. Please correlate to patient's clinical exam. 4. 7 mm solid pulmonary nodule in the right middle lobe. Non-contrast chest CT at 6-12 months is recommended. If the nodule is stable at time of repeat CT, then future CT at 18-24 months (from today's scan) is considered optional for low-risk patients, but is recommended for high-risk patients. This recommendation follows the consensus statement: Guidelines for Management of Incidental Pulmonary Nodules Detected on CT Images: From the Fleischner Society 2017; Radiology 2017; 284:228-243. 5. Aortic atherosclerosis. Aortic Atherosclerosis (ICD10-I70.0). Electronically Signed   By: Fidela Salisbury M.D.   On: 04/20/2021 14:21     Principal Problem:   Hemorrhagic shock (HCC) Active Problems:   Reflux esophagitis   Chronic right-sided thoracic back pain   Acute upper GI bleed   Acute blood loss anemia     LOS: 4 days     Carmell Austria, MD 04/21/2021, 5:23 PM [facility] [phone]

## 2021-04-21 NOTE — Progress Notes (Signed)
Received call from CCMD that pt had converted to what appeared to be a-fib. Went to assess pt and found pt nude with all leads removed. Pt was extremely agitated and jerky. C/o tightness in chest and said "I feel like I'm dying" and like she couldn't breath. Paged FMTS who come to assess her. Orders placed.   Raelyn Number, RN

## 2021-04-21 NOTE — Progress Notes (Signed)
FPTS Interim Progress Note  S:Went to bedside with Dr. Larae Grooms. Patient was sleeping and not in distress anymore. Patient had maroon fluid in toilet pan. Spoke with nursing who says GI has come by and just to follow hemoglobin for now.   O: BP 120/71 (BP Location: Right Arm)   Pulse 100   Temp 97.9 F (36.6 C) (Oral)   Resp 13   Ht '4\' 11"'$  (1.499 m)   Wt 86 kg   SpO2 97%   BMI 38.29 kg/m    General: Laying comfortably, NAD Respiratory: no iWOB  A/P: -f/u Hgb at noon -monitor clinically -added low dose atarax for anxiety  Gerrit Heck, MD 04/21/2021, 12:02 PM PGY-1, [place] [facility] pager [phone]

## 2021-04-21 NOTE — Progress Notes (Addendum)
FPTS Interim Progress Note  S: Went to bedside with Dr. Jinny Sanders after receiving page from nurse that patient endorsing chest pain and appearing diaphoretic. Nurse stated that at one point patient may have been in atrial fibrillation. Upon arrival, patient denies any chest pain or dyspnea but states that her back hurts. Shares that back pain is a chronic issue for her and she takes baclofen at home.   O: BP 120/71 (BP Location: Right Arm)   Pulse 100   Temp 97.9 F (36.6 C) (Oral)   Resp 13   Ht '4\' 11"'$  (1.499 m)   Wt 86 kg   SpO2 97%   BMI 38.29 kg/m   General: Patient in distress given back pain, moving consistently to get to a comfortable position.  CV: tachycardic with regular rhythm, no murmurs or gallops auscultated Resp: CTAB  A/P: -sinus tachycardia likely secondary to back pain, has not received home baclofen yet, baclofen ordered. Repositioning seems to help. Continue with remainder of pain regimen  -Kpad -EKG did not denote atrial fibrillation and no ST elevations noted -ordered troponins x2, will trend -conveyed this plan to nurse   Donney Dice, DO 04/21/2021, 10:13 AM PGY-2, [place] pager [phone]

## 2021-04-21 NOTE — Progress Notes (Signed)
Pt obtained some relief with med admin earlier this am and fell asleep. When pt awoke became very agitated again, c/o pain and same "muscle spasms" as prior. HR was in the 130s. Pt diaphoretic and pale. Unable to keep leads on pt for accurate reading d/t inability to be still. Consulted with charge nurse who notified RRT for assessment.   Raelyn Number, RN

## 2021-04-21 NOTE — Progress Notes (Signed)
Family Medicine Teaching Service Daily Progress Note Intern Pager: [phone]  Patient name: [name] Medical record number: [id number] Date of birth: [birth year] Age: 80 y.o. Gender: female  Primary Care Provider: Zenia Resides, MD Consultants: GI, IR Code Status: Partial  Pt Overview and Major Events to Date:  9/7 admitted for acute GI bleed, EGD showed frank blood in stomach, large clots in duodenal bulb, IR embolized GDA, massive transfusion protocol, ICU on vasopressors 9/8 Repeat EGD- Hemoclips, Epinephrine injection, Bipolar probe in 2 non-bleeding duodenal vessels. Superficial gastric ulcers seen without bleeding. 9/11 back to floor status  Assessment and Plan: 80 year old female presented with acute GI bleed with PMH DM2, HTN, asthma, macular degeneration, and depresison.  Acute blood loss anemia secondary to acute upper GI bleeding s/p EGD and embolization by IR 9/7 and repeat EGD on 9/8 Has received 11 units of red blood cells, and 2 units platelets during hospital stay. Nursing called at around 1100 and says stools are dark and have blood, we will check stool sample. Blood pressures have been elevated, not low recently. Patient's pain has been uncontrolled but due to her back pain.  CTA 9/10 showed no acute abdominal findings, small amount of free fluid in the right pericolic gutter, free fluid tracking along liver dome and gallbladder.  -GI following, appreciate recommendations.  Hemoglobin 7.6 this AM, keep above 7. Will repeat Hgb at noon  -Diet advanced to soft dysphagia 3 -Daily CBCs -IV Protonix q12h -Docusate, colace, miralax -CTA or RBC-Scan if further bleeding -Outpatient GI follow-up with Dr. Benson Norway  H/o Chest Pressure r/o ACS On admission had EKG that was NSR and troponins that trended flat. Today, had chest tightness and nursing said at one point was in atrial fibrillation. EKG was performed which was sinus tachycardia without acute ST elevation. Given  patient's chest tightness initially per nursing, troponin trend is being re-obtained. Patient says she did not have any chest pain when I went into room.  -monitor troponin trend -monitor clinically -low dose atarax  Incidental Pulmonary Nodules on CTA 7 mm solid pulmonary nodule in the right middle lobe. Not having any shortness of breath but does have chronic back pain. Increased from 2L O2 to 4 L O2 nasal canula due to desaturation to 83% overnight. -Non-contrast chest CT at 6-12 months is recommended based on current imaging. If the nodule is stable at time of repeat CT, then future CT at 18-24 months (from today's scan) is considered optional for low-risk patients.  T2DM CBGs have been 57-167. D50 IV x2 given.  -SSI d/c'd yesterday, no basal -monitor CBGs and oral intake  Elevated Blood Pressures 183/77 this morning, has been 120s-150s/50s-60s. Normalized to 120/71. Likely due to her intense back pain. Some tachycardia to low 100s and episode of 172 pulse and 147 which subsequently normalized.  -Holding home metoprolol and enalapril due to history of GI bleed with hypotension  Chronic Back Pain Today patient had uncontrolled back pain and was writhing in pain. Her home medications include baclofen 10 mg TID prn and hydrocodone-acetaminophen 5-325 q4h prn. -Tylenol 650 mg q6h scheduled -baclofen 10 mg q3h prn -lidocaine transdermal patch daily -oxycodone 10 mg q6h scheduled -fentanyl 25 mcg q2h prn -K pad   AKI, resolved Likely was secondary to hypoperfusion in the setting of GI bleed. BMP showed creatinine of 0.94.  Bladder scan showed only 70 mL in bladder. -Monitor on BMPs  Leukocytosis, resolved Had WBC of 12.8 on 9/9. Today it is 5.3.  Did have pericolonic fat stranding on CT, however white blood cell count has improved so less likely to be colitis. -Remains afebrile -Monitor fever curve -CBC daily  Asthma Home medication of albuterol 2 puffs q4h puffs as  needed -Holding currently  Hyperlipidemia -Continue home rosuvastatin 20 mg daily  Migraines Was taken off topomax due to itching. Home medication metoprolol 25 mg. -Has received one 5 mg IV dose in hospital of metoprolol  Depression, Peripheral Neuropathy -continue home medication of Cymbalta increased to 60 mg -added atarax  FEN/GI: Dysphagia 3 Diet/colace, protonix, miralax PPx: SCDs Dispo:Home pending clinical improvement . Barriers include continued medical work-up.   Subjective:  Today patient had extreme back pain and was writhing in pain in bed. Per nursing had episode of A fib on monitors with some chest tightness. Patient says she did not have any chest pain. EKG was obtained which showed sinus tachycardia. Troponins were ordered. Pain medications of baclofen, lidocaine patch given to patient. Ordered K pad for her back. Has been having anxiety and we added atarax.   Objective: Temp:  [97.6 F (36.4 C)-99 F (37.2 C)] 97.8 F (36.6 C) (09/11 0440) Pulse Rate:  [72-172] 95 (09/11 0440) Resp:  [11-24] 12 (09/11 0440) BP: (94-183)/(55-114) 183/77 (09/11 0440) SpO2:  [83 %-100 %] 100 % (09/11 0440) Weight:  [86 kg] 86 kg (09/10 2033) Physical Exam: General: In distress, complaining of back pain, turning over in bed Cardiovascular: Tachycardic to 117, RRR no murmurs rubs or gallops Respiratory: CTAB, no wheezes rales or crackles Abdomen: Nondistended, no bleeding seen on bed Extremities: Warm, well perfused  Laboratory: Recent Labs  Lab 04/18/21 0639 04/18/21 1630 04/19/21 0111 04/19/21 1117 04/20/21 1121 04/20/21 1730 04/21/21 0036  WBC 14.7*  --  12.8*  --   --   --  5.3  HGB 7.5*   < > 6.8*   < > 7.9* 8.2* 7.6*  7.4*  HCT 21.7*   < > 19.7*   < > 24.5* 25.0* 23.2*  22.6*  PLT 97*  --  94*  --   --   --  87*   < > = values in this interval not displayed.   Recent Labs  Lab 04/16/21 1807 04/17/21 0950 04/17/21 1623 04/18/21 0149 04/18/21 0639  04/19/21 1117 04/20/21 1121 04/21/21 0036  NA 138 138   < > 139   < > 135 138 140  K 5.3* 5.2*   < > 5.3*   < > 4.3 4.1 4.1  CL 110 116*   < > 117*   < > 109 110 110  CO2 24 16*  --  21*   < > '23 25 25  '$ BUN 48* 61*   < > 48*   < > 52* 30* 23  CREATININE 1.13* 1.32*   < > 1.07*   < > 1.21* 1.02* 0.94  CALCIUM 8.4* 7.2*  --  8.6*   < > 7.8* 7.7* 7.9*  PROT 4.7* 3.0*  --  <3.0*  --   --   --   --   BILITOT 0.6 0.5  --  1.4*  --   --   --   --   ALKPHOS 50 33*  --  23*  --   --   --   --   ALT 19 57*  --  32  --   --   --   --   AST 30 78*  --  52*  --   --   --   --  GLUCOSE 131* 182*   < > 246*   < > 108* 106* 91   < > = values in this interval not displayed.     Imaging/Diagnostic Tests: CT CHEST ABDOMEN PELVIS WO CONTRAST  Result Date: 04/20/2021 CLINICAL DATA:  Severe back pain post fall. EXAM: CT CHEST, ABDOMEN AND PELVIS WITHOUT CONTRAST TECHNIQUE: Multidetector CT imaging of the chest, abdomen and pelvis was performed following the standard protocol without IV contrast. COMPARISON:  October 08, 2015 FINDINGS: CT CHEST FINDINGS Cardiovascular: Right internal jugular approach central venous catheter terminates at the cavoatrial junction. Normal heart size. No pericardial effusion. Mediastinum/Nodes: No enlarged mediastinal, hilar, or axillary lymph nodes. Thyroid gland, trachea, and esophagus demonstrate no significant findings. Lungs/Pleura: 7 mm solid pulmonary nodule in the right middle lobe. Lungs otherwise clear. Musculoskeletal: No chest wall mass or suspicious bone lesions identified. CT ABDOMEN PELVIS FINDINGS Hepatobiliary: Small amount of perihepatic free fluid tracking along the diaphragm of the liver and gallbladder. Pancreas: Unremarkable. No pancreatic ductal dilatation or surrounding inflammatory changes. Spleen: Normal in size without focal abnormality. Adrenals/Urinary Tract: Adrenal glands are unremarkable. Kidneys are normal, without renal calculi, focal lesion, or  hydronephrosis. Bladder is unremarkable. Stomach/Bowel: Stomach is within normal limits. No evidence of appendicitis. No evidence of bowel wall thickening, distention, or inflammatory changes. Vascular/Lymphatic: Aortic atherosclerosis. Again seen is minimal aneurysmal dilation of the infrarenal aorta with a chronic short segment dissection flap, eccentric to the left. No enlarged abdominal or pelvic lymph nodes. Sequela of coil embolization of branches of GDA. Reproductive: Status post hysterectomy. No adnexal masses. Other: No abdominal wall hernia or abnormality. Small amount of free fluid in the right pericolic gutter. Musculoskeletal: No acute or significant osseous findings. Diffuse subcutaneous fat stranding in the bilateral flanks, buttocks and upper thighs. IMPRESSION: 1. No evidence of acute traumatic injury to the chest. 2. Small amount of perihepatic free fluid tracking along the liver dome and gallbladder, etiology uncertain. Small amount of free fluid in the right pericolic gutter. 3. Diffuse subcutaneous fat stranding in the bilateral flanks, buttocks and upper thighs. Please correlate to patient's clinical exam. 4. 7 mm solid pulmonary nodule in the right middle lobe. Non-contrast chest CT at 6-12 months is recommended. If the nodule is stable at time of repeat CT, then future CT at 18-24 months (from today's scan) is considered optional for low-risk patients, but is recommended for high-risk patients. This recommendation follows the consensus statement: Guidelines for Management of Incidental Pulmonary Nodules Detected on CT Images: From the Fleischner Society 2017; Radiology 2017; 284:228-243. 5. Aortic atherosclerosis. Aortic Atherosclerosis (ICD10-I70.0). Electronically Signed   By: Fidela Salisbury M.D.   On: 04/20/2021 14:21     Gerrit Heck, MD 04/21/2021, 6:52 AM PGY-1, [place] Intern pager: [phone], text pages welcome

## 2021-04-21 NOTE — Significant Event (Signed)
Rapid Response Event Note   Reason for Call :  Tachycardia  Initial Focused Assessment:  Called by charge RN because this patient is stating that she is thrashing around in bed. The patient is naked, tachycardic, and hypertensive (152/62). The RN was worried that this patient could be having an acute event. The patient stated that she feels that she is being "stabbed in the back" because of her muscle spasms. The patient will not keep her clothes on nor her telemetry leads. 12-lead obtained and showed that the patient was in [place]. Troponin is mildly elevated on labs.   Family was present in the room and stated that the patient will do this at home, and she normally takes a benzo to help with her anxiety. I see that the patient has been given pain medication and a 1x dose of Atarax 10 mg today, a 1x dose of klonopin on 9/10. I don't see that she has any PRN medications for anxiety and feel that a low dose benzo could benefit this patient.  Plan of Care:  Patient will remain on 4E.    Event Summary:    Call Time: [place] Time: [place] Time: [place], RN

## 2021-04-22 LAB — GLUCOSE, CAPILLARY
Glucose-Capillary: 111 mg/dL — ABNORMAL HIGH (ref 70–99)
Glucose-Capillary: 109 mg/dL — ABNORMAL HIGH (ref 70–99)
Glucose-Capillary: 117 mg/dL — ABNORMAL HIGH (ref 70–99)
Glucose-Capillary: 81 mg/dL (ref 70–99)

## 2021-04-22 LAB — BASIC METABOLIC PANEL
Anion gap: 7 (ref 5–15)
BUN: 13 mg/dL (ref 8–23)
CO2: 27 mmol/L (ref 22–32)
Calcium: 8.2 mg/dL — ABNORMAL LOW (ref 8.9–10.3)
Chloride: 106 mmol/L (ref 98–111)
Creatinine, Ser: 0.99 mg/dL (ref 0.44–1.00)
GFR, Estimated: 58 mL/min — ABNORMAL LOW (ref >60)
Glucose, Bld: 113 mg/dL — ABNORMAL HIGH (ref 70–99)
Potassium: 3.9 mmol/L (ref 3.5–5.1)
Sodium: 140 mmol/L (ref 135–145)

## 2021-04-22 LAB — CBC
HCT: 23.4 % — ABNORMAL LOW (ref 36.0–46.0)
Hemoglobin: 7.7 g/dL — ABNORMAL LOW (ref 12.0–15.0)
MCH: 30.4 pg (ref 26.0–34.0)
MCHC: 32.9 g/dL (ref 30.0–36.0)
MCV: 92.5 fL (ref 80.0–100.0)
Platelets: 111 10*3/uL — ABNORMAL LOW (ref 150–400)
RBC: 2.53 MIL/uL — ABNORMAL LOW (ref 3.87–5.11)
RDW: 15.8 % — ABNORMAL HIGH (ref 11.5–15.5)
WBC: 7.1 10*3/uL (ref 4.0–10.5)
nRBC: 0.3 % — ABNORMAL HIGH (ref 0.0–0.2)

## 2021-04-22 MED ORDER — FENTANYL CITRATE PF 50 MCG/ML IJ SOSY
25.0000 ug | PREFILLED_SYRINGE | INTRAMUSCULAR | Status: DC | PRN
Start: 2021-04-22 — End: 2021-04-23
  Administered 2021-04-22: 25 ug via INTRAVENOUS
  Filled 2021-04-22: qty 1

## 2021-04-22 MED ORDER — SODIUM CHLORIDE 0.9% FLUSH: 10.0000 mL | INTRAVENOUS | Status: DC | PRN

## 2021-04-22 MED ORDER — BACLOFEN 10 MG PO TABS
10.0000 mg | ORAL_TABLET | Freq: Three times a day (TID) | ORAL | Status: DC
  Administered 2021-04-22 – 2021-04-26 (×13): 10 mg via ORAL
  Filled 2021-04-22 (×14): qty 1

## 2021-04-22 MED ORDER — SODIUM CHLORIDE 0.9% FLUSH
10.0000 mL | Freq: Two times a day (BID) | INTRAVENOUS | Status: DC
  Administered 2021-04-22 – 2021-04-25 (×4): 10 mL

## 2021-04-22 MED ORDER — OXYCODONE HCL 5 MG PO TABS
10.0000 mg | ORAL_TABLET | Freq: Four times a day (QID) | ORAL | Status: DC | PRN
  Administered 2021-04-22 – 2021-04-25 (×10): 10 mg via ORAL
  Filled 2021-04-22 (×11): qty 2

## 2021-04-22 NOTE — Progress Notes (Signed)
FPTS Interim Progress Note  S: Patient complains of back pain which she states is similar to previous.  She is not sure if increasing the oxycodone dose has made a difference or not which occurred when she was in the ICU.  She states she is hopeful that she may be able to go home tomorrow.  She has no other concerns or complaints and denies any blood in her stool.  O: BP (!) 132/51   Pulse 80   Temp (P) 98.3 F (36.8 C) (Oral)   Resp 20   Ht '4\' 11"'$  (1.499 m)   Wt 86 kg   SpO2 97%   BMI 38.29 kg/m    General: Alert and oriented in no apparent distress Heart: Regular rate and rhythm with no murmurs appreciated Lungs: CTA bilaterally, no wheezing Abdomen: Bowel sounds present, no abdominal pain  A/P: Patient denies any blood in her stool, last few hemoglobins have been stable.  She still complains of back pain which is chronic for her and seems unchanged per her.  She did have an increase in her oxycodone after coming out of the ICU and is not sure if this is made a difference or not.  I did discuss with her that I think it would be a good idea to try to scale that back even though she is limited in what she can use for back pain.  I spoke with her about the adverse effects that can occur with chronic high-dose narcotic use.  We will continue to monitor her hemoglobin tomorrow morning and possible discharge for tomorrow.  Remainder per daytime progress note.  Lurline Del, DO 04/22/2021, 9:21 PM PGY-3, [place] Service pager [phone]

## 2021-04-22 NOTE — Progress Notes (Signed)
Called patient's son [name] [phone] with update. Plan to be seen by PT and OT for eval. Pt sleeping at this time. Pt assisted to bedside commode and given bath earlier. Son would like CM to see about rehab for patient. Pt resting with call bell within reach.  Will continue to monitor. '

## 2021-04-22 NOTE — Discharge Instructions (Addendum)
You were hospitalized at [facility] due to bloody stools.  We expect this is from a GI bleed which improved after embolization and clipping.  We are so glad you are feeling better.  Be sure to follow-up with your regularly scheduled appointments.  Please also be sure to follow-up with our clinic/PCP on 9/22 at 10:10 am.  Please do NOT take ibuprofen, MELOXICAM or other NSAIDs, please also avoid Goody powders as we believe chronic consumption of this was the cause of your bleeding. Please make sure to take all medications as prescribed.Thank you for allowing [place] to be a part of your medical care.  Take care, [facility] family medicine team

## 2021-04-22 NOTE — Progress Notes (Signed)
Spoke with son [name] to give latest update. Updated on GI consult latest note. Patient aware that son has been updated. Pt has spoken with other family members as well to update. Son aware that PT/OT were not able to see pt today but evaluations have been ordered. Pt resting with call bell within reach.  Will continue to monitor.'

## 2021-04-22 NOTE — Progress Notes (Signed)
Subjective: Patient seems to be doing well today.  She denies having abdominal pain, nausea or vomiting. She has not had a BM in the last couple of days. She has not had any further signs of bleeding.  She continues to complain of back pain  Objective: Vital signs in last 24 hours: Temp:  [98.5 F (36.9 C)-98.8 F (37.1 C)] 98.6 F (37 C) (09/12 0811) Pulse Rate:  [80-98] 80 (09/12 0337) Resp:  [13-19] 18 (09/12 0811) BP: (123-162)/(55-80) 123/80 (09/12 0811) SpO2:  [94 %-97 %] 97 % (09/12 0811) Last BM Date: 04/19/21  Intake/Output from previous day: 09/11 0701 - 09/12 0700 In: 1440 [P.O.:1440] Out: 300 [Urine:300] Intake/Output this shift: No intake/output data recorded.  General appearance: alert, cooperative, appears stated age, morbidly obese, and pale Resp: clear to auscultation bilaterally Cardio: regular rate and rhythm, S1, S2 normal, no murmur, click, rub or gallop GI: soft, non-tender; bowel sounds normal; no masses,  no organomegaly Extremities: extremities normal, atraumatic, no cyanosis or edema  Lab Results: Recent Labs    04/21/21 0036 04/21/21 1128 04/22/21 0555  WBC 5.3 7.4 7.1  HGB 7.6*  7.4* 7.8* 7.7*  HCT 23.2*  22.6* 24.0* 23.4*  PLT 87* 108* 111*   BMET Recent Labs    04/20/21 1121 04/21/21 0036 04/22/21 0555  NA 138 140 140  K 4.1 4.1 3.9  CL 110 110 106  CO2 '25 25 27  '$ GLUCOSE 106* 91 113*  BUN 30* 23 13  CREATININE 1.02* 0.94 0.99  CALCIUM 7.7* 7.9* 8.2*   Studies/Results: No results found.  Medications: I have reviewed the patient's current medications. Prior to Admission:  Medications Prior to Admission  Medication Sig Dispense Refill Last Dose   acetic acid-hydrocortisone (VOSOL-HC) OTIC solution Place 3 drops into both ears 2 (two) times daily. 10 mL 3 Past Week   albuterol (VENTOLIN HFA) 108 (90 Base) MCG/ACT inhaler INHALE 2 PUFFS INTO THE LUNGS EVERY 4 HOURS AS NEEDED FOR WHEEZING OR SHORTNESS OF BREATH (Patient taking  differently: Inhale 2 puffs into the lungs every 4 (four) hours as needed for shortness of breath.) 6.7 g 3 unk   Artificial Tear Ointment (DRY EYES OP) Apply 1 drop to eye 2 (two) times daily as needed (dry eyes).   Past Month   baclofen (LIORESAL) 10 MG tablet Take 1 tablet (10 mg total) by mouth 3 (three) times daily as needed for muscle spasms. 90 tablet 1 Past Month   benzonatate (TESSALON) 200 MG capsule Take 1 capsule (200 mg total) by mouth 2 (two) times daily as needed for cough. 40 capsule 3 Past Month   cefdinir (OMNICEF) 300 MG capsule Take 1 capsule (300 mg total) by mouth 2 (two) times daily. (Patient taking differently: Take 300 mg by mouth See admin instructions. Bid x 10 days) 20 capsule 0 04/16/2021   doxycycline (VIBRA-TABS) 100 MG tablet Take 1 tablet (100 mg total) by mouth 2 (two) times daily. (Patient taking differently: Take 100 mg by mouth See admin instructions. Bid x 10 days) 20 tablet 0 04/16/2021   DULoxetine (CYMBALTA) 60 MG capsule Take 60 mg by mouth daily.   04/16/2021   enalapril (VASOTEC) 10 MG tablet Take 1 tablet (10 mg total) by mouth daily. 90 tablet 0 04/16/2021   guaiFENesin-dextromethorphan (ROBITUSSIN DM) 100-10 MG/5ML syrup Take 5 mLs by mouth every 4 (four) hours as needed for cough. 118 mL 0 Past Month   HYDROcodone-acetaminophen (NORCO/VICODIN) 5-325 MG tablet Take 1 tablet by mouth  every 4 (four) hours as needed. (Patient taking differently: Take 1 tablet by mouth every 4 (four) hours as needed for moderate pain.) 12 tablet 0 04/16/2021   ibuprofen (ADVIL) 200 MG tablet Take 400 mg by mouth every 6 (six) hours as needed for headache or moderate pain.   unk   Lancets (ONETOUCH DELICA PLUS 123XX123) MISC USE   TO CHECK GLUCOSE IN THE MORNING AND AT BEDTIME (Patient taking differently: Check blood sugar once daily) 100 each 3    metoprolol succinate (TOPROL-XL) 25 MG 24 hr tablet Take 1 tablet (25 mg total) by mouth at bedtime. (Patient taking differently: Take 25 mg  by mouth daily.) 30 tablet 3 04/16/2021 at 0930   Multiple Vitamin (MULTIVITAMIN WITH MINERALS) TABS tablet Take 1 tablet by mouth daily.   04/16/2021   ondansetron (ZOFRAN) 4 MG tablet Take 1 tablet (4 mg total) by mouth every 8 (eight) hours as needed for nausea or vomiting. 20 tablet 4 04/16/2021   ONETOUCH VERIO test strip USE  STRIP TO CHECK GLUCOSE TWICE DAILY AS DIRECTED (Patient taking differently: Check blood sugar once daily) 100 each 3    rosuvastatin (CRESTOR) 20 MG tablet TAKE 1 TABLET BY MOUTH ONCE DAILY **TAKES  THE  PLACE  OF  SIMVASTATIN** (Patient taking differently: Take 20 mg by mouth daily.) 90 tablet 3 04/16/2021   Semaglutide, 1 MG/DOSE, (OZEMPIC, 1 MG/DOSE,) 2 MG/1.5ML SOPN Inject 2 mg into the skin once a week. (Patient taking differently: Inject 1 mg into the skin every Sunday.)   04/07/2021   triamcinolone ointment (KENALOG) 0.5 % Apply 1 application topically 2 (two) times daily. For elbows (Patient taking differently: Apply 1 application topically 2 (two) times daily as needed (elbow itching).) 30 g 2 unk   UNKNOWN TO PATIENT "Vitamin for macular degeneration"   04/16/2021   Scheduled:  sodium chloride   Intravenous Once   sodium chloride   Intravenous Once   acetaminophen  650 mg Oral Q6H   baclofen  10 mg Oral TID   Chlorhexidine Gluconate Cloth  6 each Topical Daily   docusate  100 mg Per Tube BID   DULoxetine  60 mg Oral Daily   lidocaine  1 patch Transdermal Q24H   pantoprazole (PROTONIX) IV  40 mg Intravenous Q12H   polyethylene glycol  17 g Per Tube Daily   rosuvastatin  20 mg Oral Daily   sodium chloride flush  10-40 mL Intracatheter Q12H   sodium chloride flush  10-40 mL Intracatheter Q12H    Assessment/Plan: 1) Upper GI bleed secondary to nonsteroidal induced duodenal ulcer-GDA/PDA embolized; by rebleed resulting in repeat endoscopy with BiCAP and Hemoclip of a visible vessel in the duodenal bulb status post 11 units of PRBC's cells; hemoglobin stable. Patient  had no further bleeding today.  If she bleeds again a colonoscopy and a possible small bowel capsule study will be considered .  Patient is agreeable to this plan. 2) Severe posthemorrhagic anemia. 3) Chronic back pain. 4) AODM/morbid obesity. 5) HTN/hyperlipidemia. 6) Migraine headaches. 7) Asthma. 8) Depression.  LOS: 5 days   Juanita Craver 04/22/2021, 3:15 PM

## 2021-04-22 NOTE — Progress Notes (Signed)
Family Medicine Teaching Service Daily Progress Note Intern Pager: [phone]  Patient name: [name] Medical record number: [id number] Date of birth: 1940/10/02 Age: 80 y.o. Gender: female  Primary Care Provider: Zenia Resides, MD Consultants: GI, IR signed off Code Status: Partial  Pt Overview and Major Events to Date:  9/7 admitted for acute GI bleed, EGD showed frank blood in stomach, large clots in duodenal bulb, IR embolized GDA, massive transfusion protocol, ICU on vasopressors 9/8 repeat EGD-hemoclips, epinephrine injection, bipolar probe and 2 nonbleeding duodenal vessels.  Superficial gastric ulcers seen without bleeding 9/11 back to floor status  Assessment and Plan:  80 year old female presented with acute GI bleed with past medical history of DM2, HTN, asthma, macular degeneration, and depression  Acute blood loss anemia secondary to acute upper GI bleed s/p EGD and embolization by IR 9/7 and repeat EGD on 9/8 Hemoglobin stable at 7.7 from 7.8. No bleeding per rectum or when using toilet per nursing and patient from last episode yesterday. Has no abdominal pain. No acute issues overnight.  Blood pressures have not been low.   -GI following, appreciate recommendations keep hemoglobin above 7 -Soft diet -Daily CBCs -switch to oral Protonix every -Docusate, Colace, MiraLAX -Consider CTA or RBC scan if further bleeding -Outpatient GI follow-up with Dr. Benson Norway  History of chest pressure rule out ACS On admission EKG had normal sinus rhythm and troponins are trended flat.  Yesterday had chest tightness, EKG was sinus tachycardia without acute ST elevation.  Troponins were obtained which trended flat [phone]).  Patient says she does not have any chest pain. -Monitor clinically -Low dose Atarax for any anxiety related pain  Chronic Back Pain Today patient's back pain is better than when I saw her yesterday. Sitting in chair and walking help her, which I encouraged her  to do. Her home medications are baclofen 10 mg TID prn and hydrocodone-acetaminophen 5-325 q4h prn. -Tylenol 650 mg q6h scheduled -baclofen 10 mg q3h scheduled today -lidocaine transdermal patch daily -oxycodone 10 mg q6h to prn today -fentanyl 25 mcg spaced today -K pad -PT/OT consult   T2DM CBGs have been 57-149 -No basal insulin or SSI on  Elevated blood pressures Likely related to back pain. 120-162/51-77 in past 24 hours Been holding metoprolol and enalapril due to history of GI bleed with hypotension  Incidental pulmonary nodules on CTA 7 mm solid pulmonary nodule in right middle lobe.  Not having shortness of breath currently on room air and saturating in the high 90s with 1 saturation of 88 that normalized. -Noncontrast chest CT at 6 to 12 months and if stable at repeat CT then future CT 8 to 24 months is optional for low risk patients.  AKI, resolved  Likely secondary to hypoperfusion to the setting of GI bleed. BMP showed creatinine of 0.99 -Monitor on BMPs  Leukocytosis,resolved Had WBC of 7.1 from 5.3 yesterday. -Afebrile -monitor fever curve -CBC daily   Asthma Home medication of albuterol 2 puffs q4h prn -holding currently  Hyperlipidemia -Continue home rosuvastatin 20 mg daily  Migraines Home medication metoprolol 25 mg -holding due to hypotension during GI bleed  Depression, Peripheral Neuropathy -Home cymbalta 60 mg  -added atarax for anxiety, would like to avoid benzos in this patient  FEN/GI: Dysphagia 3 Diet/Colace, protonix, miralax PPx: SCDs Dispo:Home pending clinical improvement . Barriers include continue medical work up.   Subjective:  Patient's only complaint today is her back pain. She feels better when in chair and walking. Encouraged doing this.  Objective: Temp:  [97.8 F (36.6 C)-98.8 F (37.1 C)] 98.5 F (36.9 C) (09/12 0337) Pulse Rate:  [80-129] 80 (09/12 0337) Resp:  [13-20] 14 (09/12 0337) BP: (120-162)/(51-77) 147/55  (09/12 0337) SpO2:  [94 %-98 %] 95 % (09/12 0337) Physical Exam: General: NAD, moving in chair but overall non toxic Cardiovascular: RRR no m/r/g, 2+ pulse bilaterally Respiratory: CTAB no w/r/c Abdomen: soft, non distended, nontender to palpation Extremities: Warm, well perfused, moves freely  Laboratory: Recent Labs  Lab 04/21/21 0036 04/21/21 1128 04/22/21 0555  WBC 5.3 7.4 7.1  HGB 7.6*  7.4* 7.8* 7.7*  HCT 23.2*  22.6* 24.0* 23.4*  PLT 87* 108* 111*   Recent Labs  Lab 04/16/21 1807 04/17/21 0950 04/17/21 1623 04/18/21 0149 04/18/21 0639 04/19/21 1117 04/20/21 1121 04/21/21 0036  NA 138 138   < > 139   < > 135 138 140  K 5.3* 5.2*   < > 5.3*   < > 4.3 4.1 4.1  CL 110 116*   < > 117*   < > 109 110 110  CO2 24 16*  --  21*   < > '23 25 25  '$ BUN 48* 61*   < > 48*   < > 52* 30* 23  CREATININE 1.13* 1.32*   < > 1.07*   < > 1.21* 1.02* 0.94  CALCIUM 8.4* 7.2*  --  8.6*   < > 7.8* 7.7* 7.9*  PROT 4.7* 3.0*  --  <3.0*  --   --   --   --   BILITOT 0.6 0.5  --  1.4*  --   --   --   --   ALKPHOS 50 33*  --  23*  --   --   --   --   ALT 19 57*  --  32  --   --   --   --   AST 30 78*  --  52*  --   --   --   --   GLUCOSE 131* 182*   < > 246*   < > 108* 106* 91   < > = values in this interval not displayed.    Imaging/Diagnostic Tests: No results found.   Gerrit Heck, MD 04/22/2021, 6:45 AM PGY-1, [place] Intern pager: [phone], text pages welcome

## 2021-04-23 LAB — CBC
HCT: 23.3 % — ABNORMAL LOW (ref 36.0–46.0)
Hemoglobin: 7.6 g/dL — ABNORMAL LOW (ref 12.0–15.0)
MCH: 30.4 pg (ref 26.0–34.0)
MCHC: 32.6 g/dL (ref 30.0–36.0)
MCV: 93.2 fL (ref 80.0–100.0)
Platelets: 121 10*3/uL — ABNORMAL LOW (ref 150–400)
RBC: 2.5 MIL/uL — ABNORMAL LOW (ref 3.87–5.11)
RDW: 15.9 % — ABNORMAL HIGH (ref 11.5–15.5)
WBC: 5.5 10*3/uL (ref 4.0–10.5)
nRBC: 0 % (ref 0.0–0.2)

## 2021-04-23 LAB — GLUCOSE, CAPILLARY
Glucose-Capillary: 90 mg/dL (ref 70–99)
Glucose-Capillary: 119 mg/dL — ABNORMAL HIGH (ref 70–99)
Glucose-Capillary: 133 mg/dL — ABNORMAL HIGH (ref 70–99)
Glucose-Capillary: 164 mg/dL — ABNORMAL HIGH (ref 70–99)

## 2021-04-23 MED ORDER — BUSPIRONE HCL 5 MG PO TABS
5.0000 mg | ORAL_TABLET | Freq: Every day | ORAL | Status: DC
  Administered 2021-04-23 – 2021-04-25 (×3): 5 mg via ORAL
  Filled 2021-04-23 (×3): qty 1

## 2021-04-23 MED ORDER — PANTOPRAZOLE SODIUM 40 MG PO TBEC
40.0000 mg | DELAYED_RELEASE_TABLET | Freq: Two times a day (BID) | ORAL | Status: DC
  Administered 2021-04-23 – 2021-04-24 (×3): 40 mg via ORAL
  Filled 2021-04-23 (×3): qty 1

## 2021-04-23 MED ORDER — METOPROLOL SUCCINATE ER 25 MG PO TB24
25.0000 mg | ORAL_TABLET | Freq: Every day | ORAL | Status: DC
  Administered 2021-04-23 – 2021-04-25 (×3): 25 mg via ORAL
  Filled 2021-04-23 (×5): qty 1

## 2021-04-23 NOTE — Evaluation (Addendum)
Occupational Therapy Evaluation Patient Details Name: [name] MRN: [id number] DOB: [birth year] Today's Date: 04/23/2021   History of Present Illness Pt adm 9/6 with GI bleed and hemorrhagic shock. Pt underwent  post-mesenteric arteriogram and embolization of the GDA for duodenal bleeding on 9/7.  Pt received 11 units PRBC during hospitalization. PMH - DM, HTN, asthma, depression, peripheral neuropathy, macular degeneration, chronic back pain   Clinical Impression   PTA, pt lives alone and typically Independent in all ADLs, IADLs (driving, shopping) and mobility without AD. Pt presents now with deficits noted below and reliant on DME support to mobilize safely at this time. Pt overall Independent for UB ADLs, Min A for LB ADLs and min guard for mobility using Rollator. Pt would benefit from skilled OT services to assess tub transfers, improve balance during LB ADLs and increase endurance for IADLs. Pt interested in SNF rehab to return to complete independence prior to discharge home.      Recommendations for follow up therapy are one component of a multi-disciplinary discharge planning process, led by the attending physician.  Recommendations may be updated based on patient status, additional functional criteria and insurance authorization.   Follow Up Recommendations  SNF;Supervision - Intermittent    Equipment Recommendations  Other (comment) (Rollator - pending continued education)    Recommendations for Other Services       Precautions / Restrictions Precautions Precautions: Fall Restrictions Weight Bearing Restrictions: No      Mobility Bed Mobility Overal bed mobility: Needs Assistance Bed Mobility: Rolling;Sidelying to Sit Rolling: Min guard Sidelying to sit: Min guard;HOB elevated       General bed mobility comments: received sitting in chair after PT eval    Transfers Overall transfer level: Needs assistance Equipment used: 4-wheeled walker;None Transfers:  Sit to/from [facility] Sit to Stand: Min guard Stand pivot transfers: Min assist       General transfer comment: Assist for balance and stability. Bed to bsc to bed with stand pivot without assistive device.    Balance Overall balance assessment: Needs assistance Sitting-balance support: No upper extremity supported Sitting balance-Leahy Scale: Good     Standing balance support: No upper extremity supported;During functional activity Standing balance-Leahy Scale: Fair Standing balance comment: fair static standing at sink though noted BLE shakiness when unsupported                           ADL either performed or assessed with clinical judgement   ADL Overall ADL's : Needs assistance/impaired Eating/Feeding: Independent;Sitting   Grooming: Min guard;Standing;Oral care;Brushing hair Grooming Details (indicate cue type and reason): noted B LE shakiness in standing without UE support Upper Body Bathing: Independent;Sitting   Lower Body Bathing: Min guard;Sit to/from stand   Upper Body Dressing : Independent;Sitting   Lower Body Dressing: Minimal assistance;Sit to/from stand   Toilet Transfer: Min guard;Ambulation   Toileting- Clothing Manipulation and Hygiene: Min guard;Sit to/from stand   Tub/ Shower Transfer: Minimal assistance;Ambulation   Functional mobility during ADLs: Min guard General ADL Comments: pt limited by decreased endurance with standing ADLs, shakiness in B LE when supported and did not use DME at baseline.     Vision Baseline Vision/History: 1 Wears glasses Ability to See in Adequate Light: 0 Adequate Patient Visual Report: No change from baseline Vision Assessment?: Vision impaired- to be further tested in functional context Additional Comments: to be further assessed - difficulty locating toothpaste on sink counter despite glasses (cognition vs vision)  Perception     Praxis      Pertinent Vitals/Pain Pain  Assessment: 0-10 Pain Score: 8  Pain Location: mid back Pain Descriptors / Indicators: Grimacing;Guarding Pain Intervention(s): Monitored during session     Hand Dominance Right   Extremity/Trunk Assessment Upper Extremity Assessment Upper Extremity Assessment: Generalized weakness   Lower Extremity Assessment Lower Extremity Assessment: Defer to PT evaluation   Cervical / Trunk Assessment Cervical / Trunk Assessment: Kyphotic   Communication Communication Communication: No difficulties   Cognition Arousal/Alertness: Awake/alert Behavior During Therapy: WFL for tasks assessed/performed Overall Cognitive Status: No family/caregiver present to determine baseline cognitive functioning                                 General Comments: WFL for basic tasks, some questionable cognitive issues noted during session. (difficulty following directions for Rollator brake mgmt, locating toothpaste on counter in front of pt, asking if she is going home today despite reporting desire for rehab placement)   General Comments  VSS on RA    Exercises     Shoulder Instructions      Home Living Family/patient expects to be discharged to:: Private residence Living Arrangements: Alone Available Help at Discharge: Family Type of Home: House Home Access: Stairs to enter Technical brewer of Steps: 2 Entrance Stairs-Rails: None Home Layout: One level     Bathroom Shower/Tub: Teacher, early years/pre: Standard     Home Equipment: Environmental consultant - 4 wheels;Shower seat;Hand held shower head   Additional Comments: Pt reports she sleeps on her living room floor because it is more comfortable than sleeping in a bed. Pt reports she can get up and down from the floor. Reports DME was her late husband's - reports Rollator too large for her      Prior Functioning/Environment Level of Independence: Independent        Comments: Drives, grocery shops, etc        OT  Problem List: Decreased strength;Decreased activity tolerance;Impaired balance (sitting and/or standing);Decreased knowledge of use of DME or AE;Pain      OT Treatment/Interventions: Self-care/ADL training;Therapeutic exercise;DME and/or AE instruction;Therapeutic activities;Patient/family education;Balance training;Energy conservation    OT Goals(Current goals can be found in the care plan section) Acute Rehab OT Goals Patient Stated Goal: Return home, get back to cooking, decrease back pain OT Goal Formulation: With patient Time For Goal Achievement: 05/07/21 Potential to Achieve Goals: Good ADL Goals Pt Will Perform Lower Body Dressing: with modified independence;sit to/from stand Pt Will Transfer to Toilet: with modified independence;ambulating Pt Will Perform Tub/Shower Transfer: Tub transfer;with modified independence;ambulating;3 in 1 Pt/caregiver will Perform Home Exercise Program: Increased strength;Both right and left upper extremity;With theraband;Independently;With written HEP provided Additional ADL Goal #1: Pt to increase standing activity tolerance during ADLs/IADLs to > 7 min to maximize endurance  OT Frequency: Min 2X/week   Barriers to D/C:            Co-evaluation              AM-PAC OT "6 Clicks" Daily Activity     Outcome Measure Help from another person eating meals?: None Help from another person taking care of personal grooming?: A Little Help from another person toileting, which includes using toliet, bedpan, or urinal?: A Little Help from another person bathing (including washing, rinsing, drying)?: A Little Help from another person to put on and taking off regular upper body clothing?: None  Help from another person to put on and taking off regular lower body clothing?: A Little 6 Click Score: 20   End of Session Equipment Utilized During Treatment: Gait belt;Other (comment) (Rollator)  Activity Tolerance: Patient tolerated treatment well Patient  left: in chair;with call bell/phone within reach;with chair alarm set  OT Visit Diagnosis: Unsteadiness on feet (R26.81);Other abnormalities of gait and mobility (R26.89);Muscle weakness (generalized) (M62.81)                Time: 1001-1019 OT Time Calculation (min): 18 min Charges:  OT General Charges $OT Visit: 1 Visit OT Evaluation $OT Eval Moderate Complexity: 1 Mod  Malachy Chamber, OTR/L Acute Rehab Services Office: [phone]   [name] 04/23/2021, 11:15 AM

## 2021-04-23 NOTE — Progress Notes (Signed)
Per patient report, patient with fall several weeks ago states pain in back is worse since that fall. MD on call with Family Medicine updated/ made aware. Patient in chair at this time call bell in reach. Ashna Dorough, Bettina Gavia RN

## 2021-04-23 NOTE — Progress Notes (Signed)
Spoke with son Denice Paradise per patient's request on phone, son said she had previously had 6-7 PT episodes and was doing much better with this. She worsened during her [place] trip and he had questions on which medications are okay with her GI bleed. Explained that meloxicam, NSAIDs, and Goody's powders are things she should avoid and likely what caused her bleeding originally. Talked to him about how other imaging studies have come back clear for her back issues. Son was questioning whether MRI is something we should consider, told him we would update him on this. Said we would talk to him on SNF/Discharge planning.

## 2021-04-23 NOTE — Evaluation (Signed)
Physical Therapy Evaluation Patient Details Name: [name] MRN: [id number] DOB: April 27, 1941 Today's Date: 04/23/2021  History of Present Illness  Pt adm 9/6 with GI bleed and hemorrhagic shock. Pt underwent  post-mesenteric arteriogram and embolization of the GDA for duodenal bleeding on 9/7.  Pt received 11 units PRBC during hospitalization. PMH - DM, HTN, asthma, depression, peripheral neuropathy, macular degeneration, chronic back pain  Clinical Impression  Pt admitted with above diagnosis and presents to PT with functional limitations due to deficits listed below (See PT problem list). Pt needs skilled PT to maximize independence and safety to allow discharge to SNF for rehab prior to return home alone.         Recommendations for follow up therapy are one component of a multi-disciplinary discharge planning process, led by the attending physician.  Recommendations may be updated based on patient status, additional functional criteria and insurance authorization.  Follow Up Recommendations SNF    Equipment Recommendations  None recommended by PT    Recommendations for Other Services       Precautions / Restrictions Precautions Precautions: Fall      Mobility  Bed Mobility Overal bed mobility: Needs Assistance Bed Mobility: Rolling;Sidelying to Sit Rolling: Min guard Sidelying to sit: Min guard;HOB elevated       General bed mobility comments: Assist for safety. Incr time and effort due to back pain    Transfers Overall transfer level: Needs assistance Equipment used: 4-wheeled walker;None Transfers: Sit to/from [facility] Sit to Stand: Min guard Stand pivot transfers: Min assist       General transfer comment: Assist for balance and stability. Bed to bsc to bed with stand pivot without assistive device.  Ambulation/Gait Ambulation/Gait assistance: Min guard Gait Distance (Feet): 140 Feet Assistive device: 4-wheeled walker Gait  Pattern/deviations: Step-through pattern;Decreased stride length Gait velocity: decr Gait velocity interpretation: 1.31 - 2.62 ft/sec, indicative of limited community ambulator General Gait Details: required use of rollator to stabilize gait. Assist for safety. Pt fatigues quickly  Stairs            Wheelchair Mobility    Modified Rankin (Stroke Patients Only)       Balance Overall balance assessment: Needs assistance Sitting-balance support: No upper extremity supported Sitting balance-Leahy Scale: Good     Standing balance support: No upper extremity supported;During functional activity Standing balance-Leahy Scale: Fair                               Pertinent Vitals/Pain Pain Assessment: 0-10 Pain Score: 10-Worst pain ever Pain Location: mid back Pain Descriptors / Indicators: Grimacing;Guarding Pain Intervention(s): Limited activity within patient's tolerance;Monitored during session;Repositioned    Home Living Family/patient expects to be discharged to:: Private residence Living Arrangements: Alone Available Help at Discharge: Family Type of Home: House Home Access: Stairs to enter Entrance Stairs-Rails: None Entrance Stairs-Number of Steps: 2 Home Layout: One level Home Equipment: Environmental consultant - 4 wheels;Shower seat;Hand held shower head Additional Comments: Pt reports she sleeps on her living room floor because it is more comfortable than sleeping in a bed. Pt reports she can get up and down from the floor.    Prior Function Level of Independence: Independent         Comments: Drives, grocery shops, etc     Hand Dominance   Dominant Hand: Right    Extremity/Trunk Assessment   Upper Extremity Assessment Upper Extremity Assessment: Defer to OT evaluation  Lower Extremity Assessment Lower Extremity Assessment: Generalized weakness    Cervical / Trunk Assessment Cervical / Trunk Assessment: Kyphotic  Communication   Communication:  No difficulties  Cognition Arousal/Alertness: Awake/alert Behavior During Therapy: WFL for tasks assessed/performed Overall Cognitive Status: Within Functional Limits for tasks assessed                                        General Comments General comments (skin integrity, edema, etc.): VSS on RA    Exercises     Assessment/Plan    PT Assessment Patient needs continued PT services  PT Problem List Decreased strength;Decreased balance;Decreased mobility;Decreased activity tolerance;Pain       PT Treatment Interventions DME instruction;Gait training;Stair training;Therapeutic exercise;Therapeutic activities;Functional mobility training;Balance training;Patient/family education    PT Goals (Current goals can be found in the Care Plan section)  Acute Rehab PT Goals Patient Stated Goal: Return home PT Goal Formulation: With patient Time For Goal Achievement: 05/07/21 Potential to Achieve Goals: Good    Frequency Min 3X/week   Barriers to discharge Decreased caregiver support Pt lives alone    Co-evaluation               AM-PAC PT "6 Clicks" Mobility  Outcome Measure Help needed turning from your back to your side while in a flat bed without using bedrails?: A Little Help needed moving from lying on your back to sitting on the side of a flat bed without using bedrails?: A Little Help needed moving to and from a bed to a chair (including a wheelchair)?: A Little Help needed standing up from a chair using your arms (e.g., wheelchair or bedside chair)?: A Little Help needed to walk in hospital room?: A Little Help needed climbing 3-5 steps with a railing? : A Little 6 Click Score: 18    End of Session Equipment Utilized During Treatment: Gait belt Activity Tolerance: Patient tolerated treatment well Patient left: in chair;Other (comment) (with OT) Nurse Communication: Mobility status PT Visit Diagnosis: Unsteadiness on feet (R26.81);Muscle weakness  (generalized) (M62.81)    Time: 0950-1008 PT Time Calculation (min) (ACUTE ONLY): 18 min   Charges:   PT Evaluation $PT Eval Moderate Complexity: [place] Pager [phone] Office [place] 04/23/2021, 10:32 AM

## 2021-04-23 NOTE — Progress Notes (Addendum)
Family Medicine Teaching Service Daily Progress Note Intern Pager: [phone]  Patient name: [name] Medical record number: [id number] Date of birth: [birth year] Age: 80 y.o. Gender: female  Primary Care Provider: Zenia Resides, MD Consultants: GI, IR signed off Code Status: Partial code  Pt Overview and Major Events to Date:  9/7 admitted for acute GI bleed, EGD showed frank blood in stomach, large clots in duodenal blood, IR embolized GDA, massive transfusion, ICU on vasopressors 9/8 repeat EGD-hemoclips, epinephrine injection, bipolar probe and 2 nonbleeding duodenal vessels.  Superficial gastric ulcers seen without bleeding 9/11 back to floor status  Assessment and Plan:  80 year old female presented with acute GI bleed with past medical history of type 2 diabetes hypertension asthma macular degeneration and depression  Blood loss anemia secondary to acute upper GI bleed s/p EGD and embolization by IR 9/7 and repeat EGD 9/8 Hemoglobin stable at 7.6 from 7.7.  No bleeding per rectum and had bowel movement without blood yesterday night per patient. Has no abdominal pain, no acute issues overnight.  Has not been hypotensive -GI following appreciate recommendations keep hemoglobin above 7 -Soft diet -Daily CBCs -Oral Protonix -Docusate, MiraLAX -Per GI consider colonoscopy or small bowel capsule study if bleeding continues -Outpatient GI follow-up with Dr. Carlean Jews  Chronic back pain Today patient's back pain is similar to yesterday. Says scheduled baclofen was helpful.  Home medications are baclofen 10 mg 3 times daily as needed and hydrocodone acetaminophen 5-3 25 every 4 hours as needed -Tylenol 650 mg every 6 hours scheduled -Baclofen 10 mg every 3 hours scheduled  -Lidocaine transdermal patch daily -Oxycodone 10 mg every 6 hours as needed  -Fentanyl 25 mg every 4 hours as needed -K pad -PT/OT recommended SNF, patient amenable  Depression/Anxiety peripheral neuropathy   Still has anxiety, and she is unsure about whether she has ever had buspirone before. -Home Cymbalta 60 mg -restart home metoprolol -S/p Atarax for anxiety would like to avoid benzos in this patient  History of chest pressure rule out ACS EKGs and troponins have trended flat.  Patient does not have any chest pain. -Monitor clinically  Type 2 diabetes mellitus  CBGs have been 90-113 -No basal insulin or SSI  Elevated blood pressures Likely related to back pain.  Blood pressures have been 150s/60s in past 24 hours.  -restart home metoprolol -Holding enalapril due to GI bleed with hypotension  Incidental pulmonary nodules on CTA -Noncontrast CT at 6 to 12 months and if stable repeat CT then future CTA to 24 months is optional for low risk patients  AKI, resolved Likely secondary to hypoperfusion due to GI bleed BMP creatinine of 0.99 yesterday  Leukocytosis, resolved Had WBC of 5.5 from 7.1 yesterday -Afebrile -Monitor fever curve -CBC daily  Asthma Home medication albuterol 2 puffs every 4 hours as needed -Holding currently monitor  Hyperlipidemia -Continue home rosuvastatin 20 mg daily  Migraines  Home medication metoprolol 25 mg -Holding due to hypotension during GI bleed  FEN/GI: Dysphagia 3 diet/Colace, Protonix, MiraLAX PPx: SCDs Dispo:Pending PT recommendations  today.  Subjective:  Patient complains of back pain, scheduled baclofen helped her some per patient.  Objective: Temp:  [98.2 F (36.8 C)-98.6 F (37 C)] 98.2 F (36.8 C) (09/13 0607) Pulse Rate:  [78] 78 (09/12 2353) Resp:  [18-20] 20 (09/13 0607) BP: (123-158)/(51-80) 158/74 (09/13 0607) SpO2:  [96 %-97 %] 96 % (09/13 [id number]) Physical Exam: General: Distressed by back pain Cardiovascular: RRR no m/r/g Respiratory: CTAB, no iWOB Abdomen: Nondistended,  nontender to palpation Extremities: Moves freely, well perfused, no edema  Laboratory: Recent Labs  Lab 04/21/21 0036 04/21/21 1128  04/22/21 0555  WBC 5.3 7.4 7.1  HGB 7.6*  7.4* 7.8* 7.7*  HCT 23.2*  22.6* 24.0* 23.4*  PLT 87* 108* 111*   Recent Labs  Lab 04/16/21 1807 04/17/21 0950 04/17/21 1623 04/18/21 0149 04/18/21 0639 04/20/21 1121 04/21/21 0036 04/22/21 0555  NA 138 138   < > 139   < > 138 140 140  K 5.3* 5.2*   < > 5.3*   < > 4.1 4.1 3.9  CL 110 116*   < > 117*   < > 110 110 106  CO2 24 16*  --  21*   < > '25 25 27  '$ BUN 48* 61*   < > 48*   < > 30* 23 13  CREATININE 1.13* 1.32*   < > 1.07*   < > 1.02* 0.94 0.99  CALCIUM 8.4* 7.2*  --  8.6*   < > 7.7* 7.9* 8.2*  PROT 4.7* 3.0*  --  <3.0*  --   --   --   --   BILITOT 0.6 0.5  --  1.4*  --   --   --   --   ALKPHOS 50 33*  --  23*  --   --   --   --   ALT 19 57*  --  32  --   --   --   --   AST 30 78*  --  52*  --   --   --   --   GLUCOSE 131* 182*   < > 246*   < > 106* 91 113*   < > = values in this interval not displayed.    Imaging/Diagnostic Tests:   Gerrit Heck, MD 04/23/2021, 6:29 AM PGY-1, [place] Intern pager: [phone], text pages welcome

## 2021-04-23 NOTE — Progress Notes (Signed)
Subjective: Feeling well.  No complaints.  No reports of any hematochezia or melena.  Objective: Vital signs in last 24 hours: Temp:  [98.2 F (36.8 C)-98.7 F (37.1 C)] 98.7 F (37.1 C) (09/13 1112) Pulse Rate:  [78-91] 84 (09/13 1112) Resp:  [16-20] 20 (09/13 1112) BP: (136-158)/(66-74) 136/66 (09/13 1112) SpO2:  [94 %-97 %] 94 % (09/13 1112) Last BM Date: 04/22/21 (per patient report)  Intake/Output from previous day: 09/12 0701 - 09/13 0700 In: -  Out: 900 [Urine:900] Intake/Output this shift: Total I/O In: 240 [P.O.:240] Out: 600 [Urine:600]  General appearance: alert and no distress GI: soft, non-tender; bowel sounds normal; no masses,  no organomegaly  Lab Results: Recent Labs    04/21/21 1128 04/22/21 0555 04/23/21 0530  WBC 7.4 7.1 5.5  HGB 7.8* 7.7* 7.6*  HCT 24.0* 23.4* 23.3*  PLT 108* 111* 121*   BMET Recent Labs    04/21/21 0036 04/22/21 0555  NA 140 140  K 4.1 3.9  CL 110 106  CO2 25 27  GLUCOSE 91 113*  BUN 23 13  CREATININE 0.94 0.99  CALCIUM 7.9* 8.2*   LFT No results for input(s): PROT, ALBUMIN, AST, ALT, ALKPHOS, BILITOT, BILIDIR, IBILI in the last 72 hours. PT/INR No results for input(s): LABPROT, INR in the last 72 hours. Hepatitis Panel No results for input(s): HEPBSAG, HCVAB, HEPAIGM, HEPBIGM in the last 72 hours. C-Diff No results for input(s): CDIFFTOX in the last 72 hours. Fecal Lactopherrin No results for input(s): FECLLACTOFRN in the last 72 hours.  Studies/Results: No results found.  Medications: Scheduled:  sodium chloride   Intravenous Once   sodium chloride   Intravenous Once   acetaminophen  650 mg Oral Q6H   baclofen  10 mg Oral TID   busPIRone  5 mg Oral Daily   Chlorhexidine Gluconate Cloth  6 each Topical Daily   docusate  100 mg Per Tube BID   DULoxetine  60 mg Oral Daily   lidocaine  1 patch Transdermal Q24H   metoprolol succinate  25 mg Oral QHS   pantoprazole  40 mg Oral BID   polyethylene glycol   17 g Per Tube Daily   rosuvastatin  20 mg Oral Daily   sodium chloride flush  10-40 mL Intracatheter Q12H   sodium chloride flush  10-40 mL Intracatheter Q12H   Continuous:  sodium chloride      Assessment/Plan: 1) Duodenal ulcer s/p coiling and BICAP with hemoclipping. 2) Anemia.   Her HGB is stable.  No overt evidence of bleeding.  Plan: 1) Follow HGB and transfuse as necessary. 2) PPI QD indefinitely. 3) Avoid NSAIDs.  LOS: 6 days   [name] D 04/23/2021, 4:32 PM

## 2021-04-24 LAB — CBC
HCT: 23.5 % — ABNORMAL LOW (ref 36.0–46.0)
Hemoglobin: 7.3 g/dL — ABNORMAL LOW (ref 12.0–15.0)
MCH: 29.7 pg (ref 26.0–34.0)
MCHC: 31.1 g/dL (ref 30.0–36.0)
MCV: 95.5 fL (ref 80.0–100.0)
Platelets: 129 10*3/uL — ABNORMAL LOW (ref 150–400)
RBC: 2.46 MIL/uL — ABNORMAL LOW (ref 3.87–5.11)
RDW: 16.3 % — ABNORMAL HIGH (ref 11.5–15.5)
WBC: 4.5 10*3/uL (ref 4.0–10.5)
nRBC: 0 % (ref 0.0–0.2)

## 2021-04-24 LAB — GLUCOSE, CAPILLARY
Glucose-Capillary: 100 mg/dL — ABNORMAL HIGH (ref 70–99)
Glucose-Capillary: 100 mg/dL — ABNORMAL HIGH (ref 70–99)
Glucose-Capillary: 109 mg/dL — ABNORMAL HIGH (ref 70–99)
Glucose-Capillary: 124 mg/dL — ABNORMAL HIGH (ref 70–99)

## 2021-04-24 MED ORDER — PANTOPRAZOLE SODIUM 40 MG PO TBEC
40.0000 mg | DELAYED_RELEASE_TABLET | Freq: Every day | ORAL | Status: DC
  Administered 2021-04-25 – 2021-04-26 (×2): 40 mg via ORAL
  Filled 2021-04-24 (×2): qty 1

## 2021-04-24 MED ORDER — ENALAPRIL MALEATE 5 MG PO TABS
10.0000 mg | ORAL_TABLET | Freq: Every day | ORAL | Status: DC
  Administered 2021-04-24 – 2021-04-26 (×3): 10 mg via ORAL
  Filled 2021-04-24 (×3): qty 2

## 2021-04-24 NOTE — Care Management Important Message (Signed)
Important Message  Patient Details  Name: [name] MRN: [id number] Date of Birth: [birth year]   Medicare Important Message Given:  Yes     Ricki Vanhandel Montine Circle 04/24/2021, 3:58 PM

## 2021-04-24 NOTE — Progress Notes (Signed)
Mobility Specialist Progress Note    04/24/21 1421  Mobility  Activity Ambulated in hall  Level of Assistance Modified independent, requires aide device or extra time  Assistive Device Four wheel walker  Distance Ambulated (ft) 136 ft  Mobility Ambulated with assistance in hallway  Mobility Response Tolerated well  Mobility performed by Mobility specialist  $Mobility charge 1 Mobility   Pre-Mobility: 79 HR, 165/74 BP Post-Mobility: 97 HR, 155/85 BP  Pt found in bed and agreeable to ambulation. Had successful void on BSC. Pt asx but turned around b/c onset chronic back pain. Returned to bed to rest with call bell in reach.   Hildred Alamin Mobility Specialist  Mobility Specialist Phone: [phone]

## 2021-04-24 NOTE — Progress Notes (Signed)
Patient with significant back pain this AM, pain medication given as ordered as needed, see MAR for charting. Patient ambulated in hallway with nursing staff and rolling walker. Gait slow and patient with complaints of feeling week in her legs. Total ambulation distance 171fet. Back in room in chair call bell with in reach. Lennie Vasco, KBettina GaviaRN

## 2021-04-24 NOTE — Progress Notes (Signed)
Spoke with radiologist on today who conveyed no compression fracture visualized on CT image from 9/10.

## 2021-04-24 NOTE — TOC Initial Note (Signed)
Transition of Care [facility]) - Initial/Assessment Note    Patient Details  Name: [name] MRN: [id number] Date of Birth: 1941-05-09  Transition of Care [facility]) CM/SW Contact:    Vinie Sill, LCSW Phone Number: 04/24/2021, 5:05 PM  Clinical Narrative:                  CSW met with patient at bedside. CSW introduced self and explained role. CSW discussed patient with PT recommendation of short term rehab at [facility]. Patient states she understands recommendations and is agreeable to short term rehab at [facility]. She expresses she lives home alone and wants rehab before returning home. CSW explained the SNF process and HTA required co-pay. Preferred SNF is [facility]. Patient states understanding. She reports she has received covid vaccine and booster shots.   CSW will provide bed offers once available.  CSW started [place] with HTA for SNF and PTAR  CSW will continue to follow and assist with discharge planning.  Thurmond Butts, MSW, LCSW Clinical Social Worker     Expected Discharge Plan: Skilled Nursing Facility Barriers to Discharge: Ship broker, Continued Medical Work up, SNF Pending bed offer   Patient Goals and CMS Choice        Expected Discharge Plan and Services Expected Discharge Plan: [place] In-house Referral: Clinical Social Work     Living arrangements for the past 2 months: Single Family Home                                      Prior Living Arrangements/Services Living arrangements for the past 2 months: Single Family Home Lives with:: Self Patient language and need for interpreter reviewed:: No        Need for Family Participation in Patient Care: Yes (Comment) Care giver support system in place?: Yes (comment)   Criminal Activity/Legal Involvement Pertinent to Current Situation/Hospitalization: No - Comment as needed  Activities of Daily Living      Permission Sought/Granted Permission sought to share information  with : Family Supports Permission granted to share information with : Yes, Verbal Permission Granted  Share Information with NAME: Raylynn Hersh  Permission granted to share info w AGENCY: SNFs  Permission granted to share info w Relationship: son  Permission granted to share info w Contact Information: [phone]  Emotional Assessment Appearance:: Appears stated age Attitude/Demeanor/Rapport: Engaged, Self-Confident Affect (typically observed): Accepting, Pleasant, Appropriate Orientation: : Oriented to Place, Oriented to Self, Oriented to  Time, Oriented to Situation Alcohol / Substance Use: Not Applicable Psych Involvement: No (comment)  Admission diagnosis:  GI bleed [K92.2] Acute GI bleeding [K92.2] Gastrointestinal hemorrhage, unspecified gastrointestinal hemorrhage type [K92.2] Patient Active Problem List   Diagnosis Date Noted   Hemorrhagic shock ([place]) 04/17/2021   Acute blood loss anemia 04/17/2021   Chronic pain syndrome    Hypotension due to hypovolemia    Acute upper GI bleed 04/16/2021   Polypharmacy 12/31/2020   Asthma exacerbation 11/28/2020   Itching of ear, bilateral 05/11/2020   Financial difficulties 11/16/2019   Osteoarthritis of knee, unspecified 06/13/2019   Rotator cuff syndrome of right shoulder 01/21/2019   Bladder prolapse, female, acquired 08/16/2018   Urinary incontinence 07/05/2018   Frequent falls 12/24/2017   Adjustment reaction with anxiety and depression 82/50/5397   Ruptured silicone breast implant 12/08/2016   Cough 03/03/2016   Iron deficiency anemia due to chronic blood loss 10/31/2015   Chronic right-sided  thoracic back pain 07/26/2015   Spinal stenosis of lumbar region 07/10/2015   Abdominal aortic atherosclerosis ([place]) 07/10/2015   UTI (urinary tract infection) 09/07/2013   Sleep apnea 05/22/2010   PERIPHERAL NEUROPATHY 02/06/2010   PULMONARY NODULE, SOLITARY 07/20/2009   Irritable bowel syndrome 11/12/2007   Non-insulin dependent  type 2 diabetes mellitus ([place]) 10/08/2006   HYPERCHOLESTEROLEMIA 10/08/2006   HYPERTRIGLYCERIDEMIA 10/08/2006   Migraine headache 10/08/2006   CARPAL TUNNEL SYNDROME 10/08/2006   HYPERTENSION, BENIGN SYSTEMIC 10/08/2006   ASTHMA, PERSISTENT, MODERATE 10/08/2006   Reflux esophagitis 10/08/2006   Eczema 10/08/2006   Psoriasis 10/08/2006   Osteoarthritis of spine 10/08/2006   VERTIGO NOS OR DIZZINESS 10/08/2006   PCP:  Zenia Resides, MD Pharmacy:   [facility] [place], [place] [address] Phone: [phone] Fax: [phone]  [id number] - [address], [place] AT [facility] [address] [place] [address] Phone: [phone] Fax: [phone]  [facility] #[id number] - [address], [place] [address] [place] [address] Phone: [phone] Fax: [phone]     Social Determinants of Health (SDOH) Interventions    Readmission Risk Interventions No flowsheet data found.

## 2021-04-24 NOTE — Progress Notes (Signed)
Family Medicine Teaching Service Daily Progress Note Intern Pager: [phone]  Patient name: [name] Medical record number: [id number] Date of birth: 30-Oct-1940 Age: 80 y.o. Gender: female  Primary Care Provider: Zenia Resides, MD Consultants: GI, IR signed off Code Status: Partial code  Pt Overview and Major Events to Date:  9/7 admitted for acute GI bleed, EGD showed frank blood in stomach, large clots in duodenal bulb, IR embolized GDA, massive transfusion, ICU on vaspressors 9/8 repeat EGD with hemoclips, epinephrine, bipolar probe and 2 nonbleeding duodenal vessels. Superficial gastric ulcers seen without bleeding 9/11 back to floor status 9/13 SNF recommended, patient amenable  Assessment and Plan:  80 year old female presenting with acute GI bleed with PMH of T2DM, HTN, asthma, macular degeneration, depression.  Blood Loss Anemia 2/2 to Acute Upper GI Bleed s/p EGD and Embolization by IR 9/7 and repeat EGD 9/8 Hemoglobin today was 7.3 from 7.6. Has not had any bleeding per rectum. Bowel movements last a day a half ago. No blood in BM/urine. No abdominal pain. No acute issues overnight. Has not been hypotensive. -GI following, appreciate recommendations. Keep Hgb above 7 -Soft diet -Daily CBCs -oral protonix -docusate, miralax -if continued bleeding may need additional GI studies -avoid NSAIDs -Outpatient GI follow up with Dr. Benson Norway  Chronic back pain Back pain is similar to yesterday, was turning over in chair. She was asking for IV pain medications, got 3 doses oxycodone yesterday and 1 this AM. Slept through the night well. Pain scores 0-10. Home medications of baclofen 10 mg TID, and hydrocodone acetaminophen 5-325 q4h as needed. -Tylenol 650 mg q6h scheduled -Baclofen 10 mg q3h scheduled -lidocaine transdermal patch daily, not been using -oxycodone 10 mg q6h prn -k pad -Consider CT thoracic spine, will talk to radiology today if previous CT adequate to r/o  compression fracture -PT/OT-SNF dispo, patient amenable  Depression/Anxiety Peripheral Neuropathy -home cymbalta 60 mg  -home metoprolol -buspirone  History of Chest Pressure r/o ACS No chest pain today  -monitor clinically  T2DM CBGs have been 100-164 -no SSI or basal insulin  Elevated BP Blood pressures have been 140s/60s-80 -metoprolol continued -home enalapril restart today  Incidental pulmonary nodules on CTA -Noncontrast CT at 6 to 12 months and if stable repeat CT if in future CT in 8 to 24 months is optional for low risk patients  AKI, resolved Likely secondary to hypoperfusion during her GI bleed, last creatinine of 0.99.  Leukocytosis, resolved Had WBC of 4.5 from 5.5 yesterday -Afebrile -Monitor fever curve -CBCs  Asthma Home medication albuterol 2 puffs every 4 hours as needed Holding currently  Hyperlipidemia -Continue home rosuvastatin 20 mg daily  Migraines Home medication metoprolol 25 mg  FEN/GI: Dysphagia 3 diet PPx: SCDs Dispo: SNF for rehab pending bed placement  Subjective:  Still complains of back pain this AM.  Objective: Temp:  [98.4 F (36.9 C)-98.7 F (37.1 C)] 98.5 F (36.9 C) (09/14 0422) Pulse Rate:  [75-91] 75 (09/14 0422) Resp:  [16-20] 20 (09/14 0422) BP: (136-156)/(60-89) 143/89 (09/14 0422) SpO2:  [94 %-97 %] 97 % (09/14 0422) Physical Exam: General: Distressed about back pain Cardiovascular: RRR no m/r/g Respiratory: CTAB, no iWOB Abdomen: Nondistended nontender to palpation Extremities: Warm, well perfused, moves freely  Laboratory: Recent Labs  Lab 04/21/21 1128 04/22/21 0555 04/23/21 0530  WBC 7.4 7.1 5.5  HGB 7.8* 7.7* 7.6*  HCT 24.0* 23.4* 23.3*  PLT 108* 111* 121*   Recent Labs  Lab 04/17/21 0950 04/17/21 1623 04/18/21  0149 04/18/21 0639 04/20/21 1121 04/21/21 0036 04/22/21 0555  NA 138   < > 139   < > 138 140 140  K 5.2*   < > 5.3*   < > 4.1 4.1 3.9  CL 116*   < > 117*   < > 110 110 106   CO2 16*  --  21*   < > '25 25 27  '$ BUN 61*   < > 48*   < > 30* 23 13  CREATININE 1.32*   < > 1.07*   < > 1.02* 0.94 0.99  CALCIUM 7.2*  --  8.6*   < > 7.7* 7.9* 8.2*  PROT 3.0*  --  <3.0*  --   --   --   --   BILITOT 0.5  --  1.4*  --   --   --   --   ALKPHOS 33*  --  23*  --   --   --   --   ALT 57*  --  32  --   --   --   --   AST 78*  --  52*  --   --   --   --   GLUCOSE 182*   < > 246*   < > 106* 91 113*   < > = values in this interval not displayed.     Imaging/Diagnostic Tests:   Gerrit Heck, MD 04/24/2021, 7:00 AM PGY-1, [place] Intern pager: [phone], text pages welcome

## 2021-04-25 ENCOUNTER — Inpatient Hospital Stay (HOSPITAL_COMMUNITY): Payer: HMO

## 2021-04-25 ENCOUNTER — Encounter (HOSPITAL_COMMUNITY): Payer: HMO

## 2021-04-25 DIAGNOSIS — M79604 Pain in right leg: Secondary | ICD-10-CM

## 2021-04-25 DIAGNOSIS — M79605 Pain in left leg: Secondary | ICD-10-CM

## 2021-04-25 LAB — CBC
HCT: 23.6 % — ABNORMAL LOW (ref 36.0–46.0)
Hemoglobin: 7.4 g/dL — ABNORMAL LOW (ref 12.0–15.0)
MCH: 30.1 pg (ref 26.0–34.0)
MCHC: 31.4 g/dL (ref 30.0–36.0)
MCV: 95.9 fL (ref 80.0–100.0)
Platelets: 125 10*3/uL — ABNORMAL LOW (ref 150–400)
RBC: 2.46 MIL/uL — ABNORMAL LOW (ref 3.87–5.11)
RDW: 16.5 % — ABNORMAL HIGH (ref 11.5–15.5)
WBC: 5 10*3/uL (ref 4.0–10.5)
nRBC: 0 % (ref 0.0–0.2)

## 2021-04-25 LAB — GLUCOSE, CAPILLARY
Glucose-Capillary: 87 mg/dL (ref 70–99)
Glucose-Capillary: 90 mg/dL (ref 70–99)
Glucose-Capillary: 97 mg/dL (ref 70–99)
Glucose-Capillary: 128 mg/dL — ABNORMAL HIGH (ref 70–99)

## 2021-04-25 LAB — SARS CORONAVIRUS 2 (TAT 6-24 HRS): SARS Coronavirus 2: NEGATIVE

## 2021-04-25 MED ORDER — OXYCODONE HCL 5 MG PO TABS
7.5000 mg | ORAL_TABLET | Freq: Four times a day (QID) | ORAL | Status: DC | PRN
  Administered 2021-04-25 – 2021-04-26 (×6): 7.5 mg via ORAL
  Filled 2021-04-25 (×5): qty 2

## 2021-04-25 MED ORDER — BUSPIRONE HCL 5 MG PO TABS
5.0000 mg | ORAL_TABLET | Freq: Three times a day (TID) | ORAL | Status: DC
  Administered 2021-04-25 – 2021-04-26 (×4): 5 mg via ORAL
  Filled 2021-04-25 (×6): qty 1

## 2021-04-25 MED ORDER — INFLUENZA VAC A&B SA ADJ QUAD 0.5 ML IM PRSY
0.5000 mL | PREFILLED_SYRINGE | INTRAMUSCULAR | Status: DC
  Filled 2021-04-25: qty 0.5

## 2021-04-25 MED ORDER — SENNA 8.6 MG PO TABS
1.0000 | ORAL_TABLET | Freq: Two times a day (BID) | ORAL | Status: DC
  Administered 2021-04-25 – 2021-04-26 (×3): 8.6 mg via ORAL
  Filled 2021-04-25 (×5): qty 1

## 2021-04-25 NOTE — Progress Notes (Signed)
FPTS Interim Progress Note  S: Patient noted to have right lower extremity edema. Went to go evaluate patient given concern for DVT. Patient moving consistently in bed given chronic back pain. Only immediate concerns are that she is having persistent back pain. When asked, she denies dyspnea, chest pain and palpitations. Reports that she is having bilateral calf tenderness that started prior to this hospitalization but seems to be worsening today. Also reports that she has run out of her diapers and has already contacted family regarding bringing more.   O: BP 123/74 (BP Location: Right Arm)   Pulse 98   Temp 98 F (36.7 C) (Oral)   Resp 20   Ht '4\' 11"'$  (1.499 m)   Wt 86 kg   SpO2 94%   BMI 38.29 kg/m   General: Patient squirming in bed consistently, in no acute distress. CV: RRR, no murmurs or gallops auscultated  Resp: CTAB, normal work of breathing noted on room air, no focal findings noted, no respiratory distress noted Abdomen: soft, nontender, presence of bowel sounds Ext: no LE edema noted bilaterally including pedal edema and examined to the knee, radial and distal pulses strong and equal bilaterally, tenderness noted on calves bilaterally  A/P: -given concern for DVT and that patient has not been on lovenox due to prior GI bleed, will obtain DVT US bilaterally, reassuringly that patient not experiencing dyspnea with increased oxygen requirement  -encourage SCDs as patient has not been wearing them consistently -remainder of plan continued per resident progress note   Donney Dice, DO 04/25/2021, 9:46 AM PGY-2, [place] Service pager [phone]

## 2021-04-25 NOTE — TOC Progression Note (Addendum)
Transition of Care [facility]) - Progression Note    Patient Details  Name: [name] MRN: [id number] Date of Birth: 08/12/1940  Transition of Care [facility]) CM/SW [place], [place] Phone Number: 04/25/2021, 3:01 PM  Clinical Narrative:     Received insurance authorization for SNF 7 days w/ HTA Approval # [id number] must transfer to SNF w/in 5 business days  PTAR # [id number] ( good for 90 days)  MD - requested covid  SNF & RN updated   CSW will continue to follow and assist with discharge planning.  Thurmond Butts, MSW, LCSW Clinical Social Worker     Expected Discharge Plan: Skilled Nursing Facility Barriers to Discharge: Ship broker, Continued Medical Work up, SNF Pending bed offer  Expected Discharge Plan and Services Expected Discharge Plan: [place] In-house Referral: Clinical Social Work     Living arrangements for the past 2 months: Single Family Home                                       Social Determinants of Health (SDOH) Interventions    Readmission Risk Interventions No flowsheet data found.

## 2021-04-25 NOTE — Progress Notes (Signed)
FPTS Interim Progress Note  Spoke with patient's son, Denice Paradise, over the phone to provide update at his request per social work.   Donney Dice, DO 04/25/2021, 5:35 PM PGY-2, [place] Service pager [phone]

## 2021-04-25 NOTE — Progress Notes (Signed)
FPTS Interim Progress Note  S: On interview, the patient notes discomfort from her back pain. She notes that the next time she can have her pain medicine is in 2 hours. She does not voice any other complaints.   O: BP 132/60 (BP Location: Right Arm)   Pulse 78   Temp 98.7 F (37.1 C) (Oral)   Resp 20   Ht '4\' 11"'$  (1.499 m)   Wt 189 lb 9.5 oz (86 kg)   SpO2 100%   BMI 38.29 kg/m   General: elderly female, appears uncomfortable, moving around in bed   A/P: Awaiting SNF tomorrow -No changes, remainder per day team plan  Corky Sox PGY-1, Psychiatry Service pager [phone]

## 2021-04-25 NOTE — Progress Notes (Signed)
OT Cancellation Note  Patient Details Name: [name] MRN: [id number] DOB: 06/11/1941   Cancelled Treatment:    Reason Eval/Treat Not Completed: Medical issues which prohibited therapy Pt currently undergoing ultrasound to rule out DVT. Will follow-up pending results of [place].   [name] 04/25/2021, 10:40 AM

## 2021-04-25 NOTE — NC FL2 (Signed)
[place] LEVEL OF CARE SCREENING TOOL     IDENTIFICATION  Patient Name: [name] Birthdate: [birth year] Sex: female Admission Date (Current Location): 04/16/2021  [facility] and [place] Number:  Herbalist and Address:  [facility] [place]. [facility], [place] [address] [place], [place] [address]      Provider Number: [id number]  Attending Physician Name and Address:  Zenia Resides, MD  Relative Name and Phone Number:       Current Level of Care: Hospital Recommended Level of Care: [place] Prior Approval Number:    Date Approved/Denied:   PASRR Number: [id number]  Discharge Plan: SNF    Current Diagnoses: Patient Active Problem List   Diagnosis Date Noted   Hemorrhagic shock ([place]) 04/17/2021   Acute blood loss anemia 04/17/2021   Chronic pain syndrome    Hypotension due to hypovolemia    Acute upper GI bleed 04/16/2021   Polypharmacy 12/31/2020   Asthma exacerbation 11/28/2020   Itching of ear, bilateral 05/11/2020   Financial difficulties 11/16/2019   Osteoarthritis of knee, unspecified 06/13/2019   Rotator cuff syndrome of right shoulder 01/21/2019   Bladder prolapse, female, acquired 08/16/2018   Urinary incontinence 07/05/2018   Frequent falls 12/24/2017   Adjustment reaction with anxiety and depression 0000000   Ruptured silicone breast implant 12/08/2016   Cough 03/03/2016   Iron deficiency anemia due to chronic blood loss 10/31/2015   Chronic right-sided thoracic back pain 07/26/2015   Spinal stenosis of lumbar region 07/10/2015   Abdominal aortic atherosclerosis ([place]) 07/10/2015   UTI (urinary tract infection) 09/07/2013   Sleep apnea 05/22/2010   PERIPHERAL NEUROPATHY 02/06/2010   PULMONARY NODULE, SOLITARY 07/20/2009   Irritable bowel syndrome 11/12/2007   Non-insulin dependent type 2 diabetes mellitus ([place]) 10/08/2006   HYPERCHOLESTEROLEMIA 10/08/2006   HYPERTRIGLYCERIDEMIA 10/08/2006    Migraine headache 10/08/2006   CARPAL TUNNEL SYNDROME 10/08/2006   HYPERTENSION, BENIGN SYSTEMIC 10/08/2006   ASTHMA, PERSISTENT, MODERATE 10/08/2006   Reflux esophagitis 10/08/2006   Eczema 10/08/2006   Psoriasis 10/08/2006   Osteoarthritis of spine 10/08/2006   VERTIGO NOS OR DIZZINESS 10/08/2006    Orientation RESPIRATION BLADDER Height & Weight     Self, Time, Situation, Place  Normal Incontinent Weight: 189 lb 9.5 oz (86 kg) Height:  '4\' 11"'$  (149.9 cm)  BEHAVIORAL SYMPTOMS/MOOD NEUROLOGICAL BOWEL NUTRITION STATUS      Continent Diet (please see discharge)  AMBULATORY STATUS COMMUNICATION OF NEEDS Skin   Limited Assist Verbally Surgical wounds (closed incision, Right Groin)                       Personal Care Assistance Level of Assistance  Bathing, Feeding, Dressing Bathing Assistance: Limited assistance Feeding assistance: Independent Dressing Assistance: Limited assistance     Functional Limitations Info  Sight, Hearing, Speech Sight Info: Adequate Hearing Info: Adequate Speech Info: Adequate    SPECIAL CARE FACTORS FREQUENCY  PT (By licensed PT), OT (By licensed OT)     PT Frequency: 5x per week OT Frequency: 5x per week            Contractures Contractures Info: Not present    Additional Factors Info  Code Status, Allergies Code Status Info: Full Allergies Info: Amoxicillin,Azithromycin,Butalbital,Clindamycin ,Januvia,Meperidine ,Sulfamethoxazole,Buspirone,Ketorolac,Lidocaine,Macrodantin,Codeine Phosphate,Etodolac,Gabapentin,Naproxen,Pentazocine-naloxone ,Sumatriptan,Zonegran           Current Medications (04/25/2021):  This is the current hospital active medication list Current Facility-Administered Medications  Medication Dose Route Frequency Provider Last Rate Last Admin  0.9 %  sodium chloride infusion (Manually program via Guardrails IV Fluids)   Intravenous Once Ganta, Anupa, DO   Held at 04/17/21 1103   0.9 %  sodium chloride infusion  (Manually program via Guardrails IV Fluids)   Intravenous Once Gleason, Otilio Carpen, PA-C       0.9 %  sodium chloride infusion   Intravenous Once Lurline Del, DO       acetaminophen (TYLENOL) tablet 650 mg  650 mg Oral Q6H Jennelle Human B, NP   650 mg at 04/25/21 1135   baclofen (LIORESAL) tablet 10 mg  10 mg Oral TID Gladys Damme, MD   10 mg at 04/25/21 0830   busPIRone (BUSPAR) tablet 5 mg  5 mg Oral TID Gladys Damme, MD       docusate (COLACE) 50 MG/5ML liquid 100 mg  100 mg Per Tube BID Jennelle Human B, NP   100 mg at 04/24/21 2214   DULoxetine (CYMBALTA) DR capsule 60 mg  60 mg Oral Daily Ganta, Anupa, DO   60 mg at 04/25/21 0829   enalapril (VASOTEC) tablet 10 mg  10 mg Oral Daily Ganta, Anupa, DO   10 mg at 04/25/21 0829   iohexol (OMNIPAQUE) 240 MG/ML injection 50 mL  50 mL Intravenous Once PRN Sandi Mariscal, MD       lidocaine (LIDODERM) 5 % 1 patch  1 patch Transdermal Q24H Jennelle Human B, NP   1 patch at 04/22/21 0830   metoprolol succinate (TOPROL-XL) 24 hr tablet 25 mg  25 mg Oral QHS Ganta, Anupa, DO   25 mg at 04/24/21 2213   oxyCODONE (Oxy IR/ROXICODONE) immediate release tablet 7.5 mg  7.5 mg Oral Q6H PRN Ganta, Anupa, DO   7.5 mg at 04/25/21 1124   pantoprazole (PROTONIX) EC tablet 40 mg  40 mg Oral Daily Ganta, Anupa, DO   40 mg at 04/25/21 0829   polyethylene glycol (MIRALAX / GLYCOLAX) packet 17 g  17 g Per Tube Daily Jennelle Human B, NP   17 g at 04/24/21 0810   rosuvastatin (CRESTOR) tablet 20 mg  20 mg Oral Daily Welborn, Ryan, DO   20 mg at 04/25/21 0830   senna (SENOKOT) tablet 8.6 mg  1 tablet Oral BID Gerrit Heck, MD   8.6 mg at 04/25/21 1232   sodium chloride flush (NS) 0.9 % injection 10-40 mL  10-40 mL Intracatheter Q12H Hensel, Jamal Collin, MD   10 mL at 04/25/21 0830   sodium chloride flush (NS) 0.9 % injection 10-40 mL  10-40 mL Intracatheter PRN Zenia Resides, MD       sodium chloride flush (NS) 0.9 % injection 10-40 mL  10-40 mL Intracatheter  Q12H Leeanne Rio, MD   10 mL at 04/23/21 2150   sodium chloride flush (NS) 0.9 % injection 10-40 mL  10-40 mL Intracatheter PRN Leeanne Rio, MD         Discharge Medications: Please see discharge summary for a list of discharge medications.  Relevant Imaging Results:  Relevant Lab Results:   Additional Information SSN [id number] patient states she has received covid vaccine and booster shots  Vinie Sill, LCSW

## 2021-04-25 NOTE — TOC Progression Note (Signed)
Transition of Care [facility]) - Progression Note    Patient Details  Name: Anesha Westman MRN: [id number] Date of Birth: September 29, 1940  Transition of Care [facility]) CM/SW [place], [place] Phone Number: 04/25/2021, 4:11 PM  Clinical Narrative:     CSW visit with patient- informed she has been accepted by [facility] and she has been approved for SNF & PTAR. Patient requested CSW call her son., Denice [place] and provide update.  CSW spoke with patient's son - provided updated. He requested MD to give him a call. CSW send message to resident Anupa Ganta,DO.  Thurmond Butts, MSW, LCSW Clinical Social Worker    Expected Discharge Plan: Skilled Nursing Facility Barriers to Discharge: Ship broker, Continued Medical Work up, SNF Pending bed offer  Expected Discharge Plan and Services Expected Discharge Plan: [place] In-house Referral: Clinical Social Work     Living arrangements for the past 2 months: Single Family Home                                       Social Determinants of Health (SDOH) Interventions    Readmission Risk Interventions No flowsheet data found.

## 2021-04-25 NOTE — Progress Notes (Signed)
Family Medicine Teaching Service Daily Progress Note Intern Pager: [phone]  Patient name: [name] Medical record number: [id number] Date of birth: 04/03/1941 Age: 80 y.o. Gender: female  Primary Care Provider: Zenia Resides, MD Consultants: GI Code Status: Partial  Pt Overview and Major Events to Date:  9/7 admitted for acute GI bleed, EGD frank blood in stomach, large clots in duodenal bulb, IR embolized GDA, massive transfusion, ICU on vaspressors 9/8 repeat EGD with hemoclips, epinephrine, bipolar probe in 2 nonbleeding duodenal vessels. Superficial gastric ulcers seen without bleeding 9/11 back to floor status 9/13 SNF recommended, patient amenable  Assessment and Plan:  80 year old female presenting with acute GI bleed with PMH of T2DM, HTN, asthma, macular degeneration, depression.  Blood Loss Anemia 2/2 to Acute Upper GI Bleed s/p EGD and Embolization by IR 9/7 and Repeat EGD 9/8 Hemoglobin stable at 7.4 from 7.3. No bleeding per rectum. Last BM was three days ago without blood. No abdominal pain. No acute issues overnight. Has not been hypotensive, -GI following, appreciate recommendation, keep Hgb above 7 -soft diet -Daily CBCs -oral protonix -docusate, miralax added senna BID today -if continued bleeding may need additional GI studies -avoid NSAID -Outpatient GI follow up with Dr. Benson Norway  Lower Extremity Swelling Right LE with pitting edema to knee this AM, changed from previous examination. Bilateral calf pain on exam. No erythema. Has not been wearing SCDs and not been receiving medical prophylaxis due to GI bleed. No shortness of breath. -f/u DVT US of LE -encouraged SCDs  Chronic back pain Back pain similar to yesterday. Pain scores of 8-10. None overnight, slept. Radiologist yesterday said no compression fracture on CTA from this admission. Home meds baclofen 10 mg TID prn, hydrocondone-acetaminophen 5-325 q4h prn. -Tylenol 650 mg scheduled -Baclofen 10  mg scheduled -lidocaine transdermal patch daily, not been using -oxycodone decrease to 7.5 mg q6h prn today -k pad -PT/OT SNF short term rehab dispo, patient amenable  Depression/anxiety peripheral neuropathy -Home Cymbalta 60 mg -Home metoprolol -Buspirone 5 mg TID today  History of chest pressure rule out ACS No chest pain today -Monitor clinically  Type 2 diabetes mellitus  Home regimen Ozempic weekly on Sundays CBGs 87-124 -no Basal insulin or SSI  Elevated blood pressures Blood pressures have been 140s -Metoprolol continued -Enalapril started yesterday 1557  Incidental pulmonary nodules on CTA -Noncontrast CT at 6 to 12 months and if stable repeat CT in future at 8 to 24 months optional for low risk patients  AKI, resolved Likely secondary to hypoperfusion during her GI bleed, last creatinine of 0.99   Leukocytosis, resolved Had white blood cell count of 5.0 from 4.5 yesterday -Afebrile -Monitor fever curve -CBC   Asthma Home medication albuterol 2 puffs every 4 hours Holding currently  Hyperlipidemia -Continue home rosuvastatin 20 mg daily  Migraines Home medication metoprolol 25 mg  FEN/GI: Dysphagia 3 diet PPx: SCDs Dispo: SNF for acute rehab bed placement possibly tomorrow  Subjective:  Complains of back pain that is chronic. No BM in 3 days, no bleeding.  Objective: Temp:  [97.9 F (36.6 C)-98.6 F (37 C)] 97.9 F (36.6 C) (09/15 0258) Pulse Rate:  [71-89] 75 (09/15 0258) Resp:  [20] 20 (09/15 0258) BP: (132-170)/(59-83) 144/83 (09/15 0258) SpO2:  [90 %-100 %] 92 % (09/15 0258) Physical Exam: General: Distressed about back pain, Non-toxic Cardiovascular: RRR no m/r/g Respiratory: CTAB, no iWOB Abdomen: Nondistended, nontender to palpation Extremities: 1+ pitting edema to right knee,  pain to palpation on  calves bilaterally, well perfused, no erythema or warmth.   Laboratory: Recent Labs  Lab 04/23/21 0530 04/24/21 0654 04/25/21 0244   WBC 5.5 4.5 5.0  HGB 7.6* 7.3* 7.4*  HCT 23.3* 23.5* 23.6*  PLT 121* 129* 125*   Recent Labs  Lab 04/20/21 1121 04/21/21 0036 04/22/21 0555  NA 138 140 140  K 4.1 4.1 3.9  CL 110 110 106  CO2 '25 25 27  '$ BUN 30* 23 13  CREATININE 1.02* 0.94 0.99  CALCIUM 7.7* 7.9* 8.2*  GLUCOSE 106* 91 113*     Imaging/Diagnostic Tests:   Gerrit Heck, MD 04/25/2021, 7:27 AM PGY-1, [place] Intern pager: [phone], text pages welcome

## 2021-04-25 NOTE — Progress Notes (Signed)
Physical Therapy Treatment Patient Details Name: [name] MRN: [id number] DOB: 09-09-1940 Today's Date: 04/25/2021   History of Present Illness Pt adm 9/6 with GI bleed and hemorrhagic shock. Pt underwent  post-mesenteric arteriogram and embolization of the GDA for duodenal bleeding on 9/7.  Pt received 11 units PRBC during hospitalization. PMH - DM, HTN, asthma, depression, peripheral neuropathy, macular degeneration, chronic back pain    PT Comments    The pt was agreeable to session with continued focus on OOB mobility and stability. The pt was able to demo multiple sit-stand transfers with no UE support or assist as well as short bouts of ambulation without UE support in the room. The pt's mobility was most limited by thoracic back pain during the session which was reproduced a relieved by gentle massage to the area. The pt remains impulsive and internally distracted by her pain making her at increased risk for falls. Continue to recommend SNF rehab at d/c.   Pt is awaiting ultrasound to rule out DVT at this time, RN states pt has been getting up to go to the bathroom all day, cleared PT to mobilize pt up to pt tolerance.     Recommendations for follow up therapy are one component of a multi-disciplinary discharge planning process, led by the attending physician.  Recommendations may be updated based on patient status, additional functional criteria and insurance authorization.  Follow Up Recommendations  SNF     Equipment Recommendations  None recommended by PT    Recommendations for Other Services       Precautions / Restrictions Precautions Precautions: Fall Restrictions Weight Bearing Restrictions: No     Mobility  Bed Mobility Overal bed mobility: Needs Assistance Bed Mobility: Rolling;Sidelying to Sit Rolling: Supervision Sidelying to sit: Min assist       General bed mobility comments: pt reaching for UE support to pull to sitting, able to return to bed  without assist but with little regard for technique due to pt c/o back pain    Transfers Overall transfer level: Needs assistance Equipment used: None Transfers: Sit to/from [facility] Sit to Stand: Min guard Stand pivot transfers: Min guard       General transfer comment: minG to power up to standing, no evidence of instability with initial stand. pt able to complete multiple pivot transfers from bed-BSC without UE support or assist  Ambulation/Gait Ambulation/Gait assistance: Min guard Gait Distance (Feet): 60 Feet Assistive device: Rolling walker (2 wheeled) Gait Pattern/deviations: Step-through pattern;Decreased stride length;Trunk flexed Gait velocity: decr   General Gait Details: pt able to complete short bout of gait in room, asking for RW for support due to back pain. minG for safety but ambulaiton limited by pain      Balance Overall balance assessment: Needs assistance Sitting-balance support: No upper extremity supported Sitting balance-Leahy Scale: Good     Standing balance support: No upper extremity supported;During functional activity Standing balance-Leahy Scale: Fair Standing balance comment: fair static standing/gait without UE support, limited by pain at this time. pt able to reach to ground and slightly outside BOS without LOB                            Cognition Arousal/Alertness: Awake/alert Behavior During Therapy: Restless;Impulsive Overall Cognitive Status: No family/caregiver present to determine baseline cognitive functioning  General Comments: pt following basic instructions, but impulsive and internally distracted by pain. Able to state needs and assist needed      Exercises      General Comments General comments (skin integrity, edema, etc.): VSS on RA, HR to 110      Pertinent Vitals/Pain Pain Assessment: 0-10 Pain Score: 10-Worst pain ever Pain Location: R  side thoracic spine Pain Descriptors / Indicators: Constant;Discomfort;Grimacing;Guarding Pain Intervention(s): Limited activity within patient's tolerance;Monitored during session;Repositioned     PT Goals (current goals can now be found in the care plan section) Acute Rehab PT Goals Patient Stated Goal: Return home, get back to cooking, decrease back pain PT Goal Formulation: With patient Time For Goal Achievement: 05/07/21 Potential to Achieve Goals: Good Progress towards PT goals: Not progressing toward goals - comment (pain)    Frequency    Min 3X/week      PT Plan Current plan remains appropriate       AM-PAC PT "6 Clicks" Mobility   Outcome Measure  Help needed turning from your back to your side while in a flat bed without using bedrails?: A Little Help needed moving from lying on your back to sitting on the side of a flat bed without using bedrails?: A Little Help needed moving to and from a bed to a chair (including a wheelchair)?: A Little Help needed standing up from a chair using your arms (e.g., wheelchair or bedside chair)?: A Little Help needed to walk in hospital room?: A Little Help needed climbing 3-5 steps with a railing? : A Little 6 Click Score: 18    End of Session Equipment Utilized During Treatment: Gait belt Activity Tolerance: Patient limited by pain Patient left: in bed;with call bell/phone within reach;with family/visitor present Nurse Communication: Mobility status PT Visit Diagnosis: Unsteadiness on feet (R26.81);Muscle weakness (generalized) (M62.81)     Time: [id number] PT Time Calculation (min) (ACUTE ONLY): 19 min  Charges:  $Therapeutic Activity: 8-22 mins                     [place] Carbo, PT, DPT   Acute Rehabilitation Department Pager #: [phone]   Sandra Cockayne 04/25/2021, 2:42 PM

## 2021-04-25 NOTE — Progress Notes (Signed)
VASCULAR LAB    Bilateral lower extremity venous duplex has been performed.  See CV proc for preliminary results.   Ryleigh Esqueda, RVT 04/25/2021, 3:41 PM

## 2021-04-26 DIAGNOSIS — K2100 Gastro-esophageal reflux disease with esophagitis, without bleeding: Secondary | ICD-10-CM | POA: Diagnosis not present

## 2021-04-26 DIAGNOSIS — R4182 Altered mental status, unspecified: Secondary | ICD-10-CM | POA: Diagnosis not present

## 2021-04-26 DIAGNOSIS — Z7401 Bed confinement status: Secondary | ICD-10-CM | POA: Diagnosis not present

## 2021-04-26 DIAGNOSIS — R531 Weakness: Secondary | ICD-10-CM | POA: Diagnosis not present

## 2021-04-26 DIAGNOSIS — E785 Hyperlipidemia, unspecified: Secondary | ICD-10-CM | POA: Diagnosis not present

## 2021-04-26 DIAGNOSIS — M6281 Muscle weakness (generalized): Secondary | ICD-10-CM | POA: Diagnosis not present

## 2021-04-26 DIAGNOSIS — E1142 Type 2 diabetes mellitus with diabetic polyneuropathy: Secondary | ICD-10-CM | POA: Diagnosis not present

## 2021-04-26 DIAGNOSIS — R41841 Cognitive communication deficit: Secondary | ICD-10-CM | POA: Diagnosis not present

## 2021-04-26 DIAGNOSIS — D500 Iron deficiency anemia secondary to blood loss (chronic): Secondary | ICD-10-CM | POA: Diagnosis not present

## 2021-04-26 DIAGNOSIS — R2689 Other abnormalities of gait and mobility: Secondary | ICD-10-CM | POA: Diagnosis not present

## 2021-04-26 DIAGNOSIS — G8929 Other chronic pain: Secondary | ICD-10-CM | POA: Diagnosis not present

## 2021-04-26 DIAGNOSIS — R2681 Unsteadiness on feet: Secondary | ICD-10-CM | POA: Diagnosis not present

## 2021-04-26 DIAGNOSIS — R578 Other shock: Secondary | ICD-10-CM | POA: Diagnosis not present

## 2021-04-26 DIAGNOSIS — R279 Unspecified lack of coordination: Secondary | ICD-10-CM | POA: Diagnosis not present

## 2021-04-26 DIAGNOSIS — G894 Chronic pain syndrome: Secondary | ICD-10-CM | POA: Diagnosis not present

## 2021-04-26 DIAGNOSIS — R42 Dizziness and giddiness: Secondary | ICD-10-CM | POA: Diagnosis not present

## 2021-04-26 DIAGNOSIS — M549 Dorsalgia, unspecified: Secondary | ICD-10-CM | POA: Diagnosis not present

## 2021-04-26 DIAGNOSIS — E119 Type 2 diabetes mellitus without complications: Secondary | ICD-10-CM | POA: Diagnosis not present

## 2021-04-26 DIAGNOSIS — G9059 Complex regional pain syndrome I of other specified site: Secondary | ICD-10-CM | POA: Diagnosis not present

## 2021-04-26 DIAGNOSIS — K922 Gastrointestinal hemorrhage, unspecified: Secondary | ICD-10-CM | POA: Diagnosis not present

## 2021-04-26 DIAGNOSIS — R1314 Dysphagia, pharyngoesophageal phase: Secondary | ICD-10-CM | POA: Diagnosis not present

## 2021-04-26 DIAGNOSIS — R5381 Other malaise: Secondary | ICD-10-CM | POA: Diagnosis not present

## 2021-04-26 DIAGNOSIS — M546 Pain in thoracic spine: Secondary | ICD-10-CM | POA: Diagnosis not present

## 2021-04-26 DIAGNOSIS — M48061 Spinal stenosis, lumbar region without neurogenic claudication: Secondary | ICD-10-CM | POA: Diagnosis not present

## 2021-04-26 DIAGNOSIS — G629 Polyneuropathy, unspecified: Secondary | ICD-10-CM | POA: Diagnosis not present

## 2021-04-26 DIAGNOSIS — D62 Acute posthemorrhagic anemia: Secondary | ICD-10-CM | POA: Diagnosis not present

## 2021-04-26 DIAGNOSIS — I10 Essential (primary) hypertension: Secondary | ICD-10-CM | POA: Diagnosis not present

## 2021-04-26 LAB — GLUCOSE, CAPILLARY
Glucose-Capillary: 118 mg/dL — ABNORMAL HIGH (ref 70–99)
Glucose-Capillary: 59 mg/dL — ABNORMAL LOW (ref 70–99)
Glucose-Capillary: 147 mg/dL — ABNORMAL HIGH (ref 70–99)
Glucose-Capillary: 100 mg/dL — ABNORMAL HIGH (ref 70–99)

## 2021-04-26 LAB — CBC
HCT: 24.8 % — ABNORMAL LOW (ref 36.0–46.0)
Hemoglobin: 7.9 g/dL — ABNORMAL LOW (ref 12.0–15.0)
MCH: 30.7 pg (ref 26.0–34.0)
MCHC: 31.9 g/dL (ref 30.0–36.0)
MCV: 96.5 fL (ref 80.0–100.0)
Platelets: 187 10*3/uL (ref 150–400)
RBC: 2.57 MIL/uL — ABNORMAL LOW (ref 3.87–5.11)
RDW: 16.6 % — ABNORMAL HIGH (ref 11.5–15.5)
WBC: 6.5 10*3/uL (ref 4.0–10.5)
nRBC: 0 % (ref 0.0–0.2)

## 2021-04-26 MED ORDER — BUSPIRONE HCL 5 MG PO TABS: 5.0000 mg | ORAL_TABLET | Freq: Three times a day (TID) | ORAL | 1 refills | Status: AC

## 2021-04-26 MED ORDER — CLONIDINE HCL 0.1 MG/24HR TD PTWK
0.1000 mg | MEDICATED_PATCH | TRANSDERMAL | Status: DC
  Administered 2021-04-26: 0.1 mg via TRANSDERMAL
  Filled 2021-04-26: qty 1

## 2021-04-26 MED ORDER — CLONIDINE 0.1 MG/24HR TD PTWK: 0.1000 mg | MEDICATED_PATCH | TRANSDERMAL | 3 refills | Status: DC

## 2021-04-26 MED ORDER — OXYCODONE HCL 5 MG PO TABS: ORAL_TABLET | ORAL | 0 refills | Status: DC

## 2021-04-26 MED ORDER — OXYCODONE HCL 7.5 MG PO TABS: ORAL_TABLET | ORAL | 0 refills | Status: DC

## 2021-04-26 NOTE — TOC Transition Note (Signed)
Transition of Care [facility]) - CM/SW Discharge Note   Patient Details  Name: Lamayah Kroon MRN: [id number] Date of Birth: 25-Dec-1940  Transition of Care [facility]) CM/SW Contact:  Vinie Sill, LCSW Phone Number: 04/26/2021, 5:16 PM   Clinical Narrative:     Patient will Discharge to: [place]  Discharge Date: 04/26/2021 Family Notified: attempted to call son,Rich- voice mailbox full- informed patient Requested RN to try and call patient's son once PTAR arrives Transport [id number]   Per MD patient is ready for discharge. RN, patient, and facility notified of discharge. Discharge Summary sent to facility. RN given number for [phone]. Ambulance transport requested for patient.   Clinical Social Worker signing off.  Thurmond Butts, MSW, LCSW Clinical Social Worker     Final next level of care: Skilled Nursing Facility Barriers to Discharge: Ship broker, Continued Medical Work up, SNF Pending bed offer   Patient Goals and CMS Choice        Discharge Placement                       Discharge Plan and Services In-house Referral: Clinical Social Work                                   Social Determinants of Health (SDOH) Interventions     Readmission Risk Interventions No flowsheet data found.

## 2021-04-26 NOTE — Discharge Summary (Addendum)
[facility] Discharge Summary  Patient name: [name] Medical record number: [id number] Date of birth: 11-19-1940 Age: 80 y.o. Gender: female Date of Admission: 04/16/2021  Date of Discharge: 04/26/2021 Admitting Physician: Zenia Resides, MD  Primary Care Provider: Zenia Resides, MD Consultants: GI, IR  Indication for Hospitalization: Acute Blood Loss Anemia  Discharge Diagnoses/Problem List:  Acute Blood Loss Anemia Secondary to Upper GI Bleed  Disposition: SNF Acute Rehab  Discharge Condition: Stable  Discharge Exam:  General:  NAD, awake, alert, responsive to questions Head: Normocephalic atraumatic CV: Regular rate and rhythm no murmurs rubs or gallops Respiratory: Clear to ausculation bilaterally, no wheezes rales or crackles, chest rises symmetrically,  no increased work of breathing Abdomen: Soft, non-tender, non-distended Extremities: no edema in LE Neuro: No focal deficits Skin: No rashes or lesions visualized  Brief Hospital Course:  80 year old female presenting with acute GI bleed with past medical history of type 2 diabetes mellitus, hypertension, asthma, macular degeneration, depression. Hospital course by problem listed below:  GI Bleed  Anemia Patient was admitted for rectal bleeding that began on the morning of 9/6 that was burgundy colored at first then) and was copious which led her to go to the ED. Patient had been taking them up until 1 month ago and regularly taking ibuprofen for 2 weeks. In the ED had hemoglobin of 7.4 and FOBT positive with a CTA that showed no acute bleed.  Patient had low blood pressures in the ED to 90s/40s to 50s. EGD was performed which showed frank blood in the stomach and clots in the duodenum.  IR embolized the GDA during emergent intervention.  Patient was transferred to ICU for massive transfusion protocol.  Pressors were used during ICU stay. On hospital day 2 patient got repeat EGD due to  persistent bloody stools.  Repeat EGD with hemoclips, epinephrine injection, and bipolar probe in 2 nonbleeding duodenal vessels. Superficial gastric ulcers were also seen without bleeding. Patient was stabilized and transferred back to the floor on hospital day 5.  Patient continued to have dark maroon fluid when going to the toilet.  At the end of his hospital stay hemoglobin remained stable above 7, and did not have any bleeding per rectum or during bowel movements.  During hospital admission received: 11 units of pRBC, 2 units of platelets, and 3 units of fresh frozen plasma.  Chest Pressure  ACS r/o  Pt reported chest pressure and SOB on admission. EKG showed NSR and Trops trended flat (26 to 23). Chest x-ray was unremarkable. Per nursing she had an episode of chest tightness with atrial fibrillation on cardiac monitoring.  Repeat EKG was performed which showed sinus tachycardia without acute ST elevation. Troponins were trended (46, 54, 59, 55) and were flat. ACS ruled out and patient did not have further chest pain throughout hospital course.  Chronic Back Pain  Patient has a history of chronic lower back pain for which she has been taking Norco 1 tablet q4hours PRN and baclofen 10 mg 3 times daily as needed since 04/05/21. She was previously opiate naive. She could not receive NSAIDs due to her GI bleed. Patient had CT of  chest, abdomen, and pelvis and ruled out occult fracture, no acute process identified. Patient has a long history of chronic back pain and follows with PM&R as an outpatient for management. Tylenol was scheduled '650mg'$  q6hrs. Due to uncontrolled back pain patient was started on baclofen, fentanyl, lidocaine patch, and oxycodone. Patient only able  to achieve minimal relief with opiates. At discharge patient had titrated down to oxycodone 7.5 mg four times a day.    AKI, resolved Creatinine on admission was 1.13 with a BUN of 48. IVF fluids administered and this elevation was  likely due to hypoperfusion in the setting of GI bleed.  Creatinine at discharge was wnl.   DM2  Pt is on home med of ozempic '2mg'$  weekly on Sundays. Last HA1C: 6.2 on 02/28/21, Regular CBG checks, SSI was held due to hypoglycemia.   Asthma  Pt was asymptomatic. Home meds: Albuterol 2 puffs q4hr as needed were held.    HLD  Home med: Crestor 20 mg was continued   Migraines  Pt was taken off of Topamax due to itching that was caused by the medication outpatient. Held home metoprolol '25mg'$  due to soft pressures. This was restarted as patient stabilized during hospital stay.   RLL PNA, resolved  On chest x-ray on 04/05/21: improving RLL PNA. Pt was noted in August 2022 to have a significant cough and new sputum production. She was found to have a RLL PNA and was given doxycycline and cefdinir for treatment. She had completed her abx. Chest xray showed no active disease. Patient was given supplemental O2, by time of discharge was on room air saturating well.    Adjustment Reaction with Depression  Pt has had a very difficult time since the death of her husband in 2021/01/09. Home med: Cymbalta '60mg'$  daily was restarted.  Was started on Atarax during hospitalization due to anxiety. She was started on buspirone.   Peripheral Neuropathy  Cymbalta 60 mg daily was restarted when patient on the floor.  Incidental Pulmonary Nodules on CTA 7 mm solid pulmonary nodule in the right middle lobe. Non-contrast chest CT at 6-12 months is recommended based on current imaging. If the nodule is stable at time of repeat CT, then future CT at 18-24 months (from today's scan) is considered optional for low-risk patients.  HTN Continued on her enalapril and metoprolol after she was stabilized.     Issues for Follow Up:  Outpatient GI follow up with Dr. Benson Norway Non-contrast chest CT at 6-12 months is recommended. If the nodule is stable at time of repeat CT, then future CT at 18-24 months (from today's scan) is  considered optional for low-risk patients. Avoid NSAIDS due to history of profuse GI Bleed Chronic Back pain, discharged on regimen of Baclofen TID prn, Oxycodone 7.5 mg 4 times a day, please continue to wean 10% daily: Day 1: 7.5 mg 3 x day Day 2: 5 mg 4 x a day  Day 3: 5 mg 3 x day Day 4: 2.5 mg 5 x day Day 5: 2.5 mg 4 x day Day 6: 2.5 mg 3 x day Day 7: 2.5 mg 2 x day Day 8: 2.5 mg 1 x daily Day 6: no oxycodone Started buspirone while inpatient, consider outpatient counseling for anxiety and chronic pain possibly CBT. Consider complex regional pain syndrome treatments and workup. She was discharged on 0.1 mg clonidine patch. Metoprolol was discontinued given initiation of clonidine patch, recheck BP and reevaluate if metoprolol needs to be added back, restart as appropriate.   Significant Procedures: 2 EGDs, One EGD with hemoclips, epinephrine, and bipolar probe on two duodenal vessels, IR Embolization of GDA  Significant Labs and Imaging:  Recent Labs  Lab 04/24/21 0654 04/25/21 0244 04/26/21 0106  WBC 4.5 5.0 6.5  HGB 7.3* 7.4* 7.9*  HCT 23.5* 23.6*  24.8*  PLT 129* 125* 187   Recent Labs  Lab 04/20/21 1121 04/21/21 0036 04/22/21 0555  NA 138 140 140  K 4.1 4.1 3.9  CL 110 110 106  CO2 '25 25 27  '$ GLUCOSE 106* 91 113*  BUN 30* 23 13  CREATININE 1.02* 0.94 0.99  CALCIUM 7.7* 7.9* 8.2*     Results/Tests Pending at Time of Discharge:  none  Discharge Medications:  Allergies as of 04/26/2021       Reactions   Amoxicillin Rash   Azithromycin Rash   Itching/rash   Butalbital Other (See Comments)   Reaction: trouble breathing, but uncertain   Clindamycin Hcl Rash   Januvia [sitagliptin] Other (See Comments)   Worsened migraines.   Levaquin [levofloxacin In D5w] Diarrhea   Meperidine Hcl Other (See Comments)   REACTION: itching and hallucinations   Sulfamethoxazole Rash   Buspirone    Made urinary incontinence worse.     Ketorolac Swelling   Received  ketorolac, methocarbamal, and lidocaine patch all at the same time.  Lip swelling 12 hours later   Lidocaine Swelling   Received ketorolac, methocarbamal, and lidocaine patch all at the same time.  Lip swelling 12 hours later   Macrodantin [nitrofurantoin Macrocrystal] Itching   Methocarbamol Swelling   Received ketorolac, methocarbamal, and lidocaine patch all at the same time.  Lip swelling 12 hours later   Codeine Phosphate Other (See Comments)   REACTION: unknown   Etodolac Other (See Comments)   REACTION: stomach upset   Gabapentin Other (See Comments)   REACTION: mental clouding  Does not want to try again even in low dose.   Naproxen Other (See Comments)   REACTION: stomach upset   Pentazocine-naloxone Hcl Other (See Comments)   REACTION: unspecified   Sumatriptan Other (See Comments)   REACTION: chest pain   Zonegran Other (See Comments)   Reaction: Unknown        Medication List     STOP taking these medications    cefdinir 300 MG capsule Commonly known as: OMNICEF   doxycycline 100 MG tablet Commonly known as: VIBRA-TABS   guaiFENesin-dextromethorphan 100-10 MG/5ML syrup Commonly known as: ROBITUSSIN DM   HYDROcodone-acetaminophen 5-325 MG tablet Commonly known as: NORCO/VICODIN   ibuprofen 200 MG tablet Commonly known as: ADVIL   UNKNOWN TO PATIENT       TAKE these medications    acetic acid-hydrocortisone OTIC solution Commonly known as: VOSOL-HC Place 3 drops into both ears 2 (two) times daily.   albuterol 108 (90 Base) MCG/ACT inhaler Commonly known as: VENTOLIN HFA INHALE 2 PUFFS INTO THE LUNGS EVERY 4 HOURS AS NEEDED FOR WHEEZING OR SHORTNESS OF BREATH What changed: reasons to take this   baclofen 10 MG tablet Commonly known as: LIORESAL Take 1 tablet (10 mg total) by mouth 3 (three) times daily as needed for muscle spasms.   benzonatate 200 MG capsule Commonly known as: TESSALON Take 1 capsule (200 mg total) by mouth 2 (two) times  daily as needed for cough.   busPIRone 5 MG tablet Commonly known as: BUSPAR Take 1 tablet (5 mg total) by mouth 3 (three) times daily.   cloNIDine 0.1 mg/24hr patch Commonly known as: CATAPRES - Dosed in mg/24 hr Place 1 patch (0.1 mg total) onto the skin once a week.   DRY EYES OP Apply 1 drop to eye 2 (two) times daily as needed (dry eyes).   DULoxetine 60 MG capsule Commonly known as: CYMBALTA Take 60 mg by mouth  daily.   enalapril 10 MG tablet Commonly known as: VASOTEC Take 1 tablet (10 mg total) by mouth daily.   metoprolol succinate 25 MG 24 hr tablet Commonly known as: TOPROL-XL Take 1 tablet (25 mg total) by mouth at bedtime. What changed: when to take this   multivitamin with minerals Tabs tablet Take 1 tablet by mouth daily.   ondansetron 4 MG tablet Commonly known as: ZOFRAN Take 1 tablet (4 mg total) by mouth every 8 (eight) hours as needed for nausea or vomiting.   OneTouch Delica Plus 0000000 Misc USE   TO CHECK GLUCOSE IN THE MORNING AND AT BEDTIME What changed: See the new instructions.   OneTouch Verio test strip Generic drug: glucose blood USE  STRIP TO CHECK GLUCOSE TWICE DAILY AS DIRECTED What changed: See the new instructions.   oxyCODONE HCl 7.5 MG Tabs Day 1: take 7.'5mg'$  QID  Day 2: take 7.'5mg'$  TID Move on to next step of titration   oxyCODONE 5 MG immediate release tablet Commonly known as: Roxicodone Day 3: take 5 mg (1 tablet) QID Day 4: take 5 mg (1 tablet) TID Day 5: take 2.5 mg (1/2 tablet) five times a day Day 6: take 2.5 mg (1/2 tablet) four times a day Day 7: take 2.5 mg (1/2 tablet) TID Day 8: take 2.5 mg (1/2 tablet) BID Day 9: take 2.5 mg (1/2 tablet) once a day Day 10: take 2.5 mg (1/2 tablet) once a day and STOP.   Ozempic (1 MG/DOSE) 2 MG/1.5ML Sopn Generic drug: Semaglutide (1 MG/DOSE) Inject 2 mg into the skin once a week. What changed:  how much to take when to take this   rosuvastatin 20 MG tablet Commonly  known as: CRESTOR TAKE 1 TABLET BY MOUTH ONCE DAILY **TAKES  THE  PLACE  OF  SIMVASTATIN** What changed:  how much to take how to take this when to take this additional instructions   triamcinolone ointment 0.5 % Commonly known as: KENALOG Apply 1 application topically 2 (two) times daily. For elbows What changed:  when to take this reasons to take this additional instructions        Discharge Instructions: Please refer to Patient Instructions section of EMR for full details.  Patient was counseled important signs and symptoms that should prompt return to medical care, changes in medications, dietary instructions, activity restrictions, and follow up appointments.   Follow-Up Appointments:  Follow-up Information     Hensel, Jamal Collin, MD Follow up.   Specialty: Family Medicine Contact information: [place] [address] [phone]         Carol Ada, MD. Schedule an appointment as soon as possible for a visit.   Specialty: Gastroenterology Why: Please make an appointment to be seen at your earliest convenience. Contact information: [place], [place] [address] [phone]         [facility]. Go on 05/02/2021.   Specialty: Family Medicine Why: Appointment at 10:10 am, please arrive 15 minutes earlier than your scheduled appointment time. Contact information: [address] [id number] [place] [phone]                Gerrit Heck, MD 04/26/2021, 12:24 PM PGY-1, [place]   I was personally present and performed or re-performed the history, physical exam and medical decision making activities of this service and have verified that the service and findings are accurately documented in the student's note.  Donney Dice, DO                  04/26/2021, 12:26 PM  PGY-2, [place]

## 2021-04-26 NOTE — Progress Notes (Signed)
Report called to Summer, nurse at [facility].  Await transport.

## 2021-04-26 NOTE — Progress Notes (Signed)
Occupational Therapy Treatment Patient Details Name: [name] MRN: [id number] DOB: September 18, 1940 Today's Date: 04/26/2021   History of present illness Pt adm 9/6 with GI bleed and hemorrhagic shock. Pt underwent  post-mesenteric arteriogram and embolization of the GDA for duodenal bleeding on 9/7.  Pt received 11 units PRBC during hospitalization. PMH - DM, HTN, asthma, depression, peripheral neuropathy, macular degeneration, chronic back pain   OT comments  Pt. Seen for skilled OT session.  Able to complete bed mobility and standing grooming task with S.  Initially asking to walk more and possibly sit in chair but declined as she states she is due for pain meds soon and did not want to risk over doing it and having pain.  Moving well overall.     Recommendations for follow up therapy are one component of a multi-disciplinary discharge planning process, led by the attending physician.  Recommendations may be updated based on patient status, additional functional criteria and insurance authorization.    Follow Up Recommendations  SNF;Supervision - Intermittent    Equipment Recommendations  Other (comment)    Recommendations for Other Services      Precautions / Restrictions Precautions Precautions: Fall       Mobility Bed Mobility Overal bed mobility: Needs Assistance Bed Mobility: Rolling;Sidelying to Sit Rolling: Supervision Sidelying to sit: Supervision       General bed mobility comments: no physical assistance required    Transfers Overall transfer level: Needs assistance Equipment used: Rolling walker (2 wheeled) Transfers: Sit to/from [facility] Sit to Stand: Min guard Stand pivot transfers: Min guard       General transfer comment: pt. requested use of RW    Balance                                           ADL either performed or assessed with clinical judgement   ADL Overall ADL's : Needs assistance/impaired      Grooming: Wash/dry hands;Oral care;Brushing hair;Standing;Supervision/safety               Lower Body Dressing: Set up;Sitting/lateral leans Lower Body Dressing Details (indicate cue type and reason): able to bring each leg into bed and don socks, aware not to bend forward for fall risk Toilet Transfer: Min guard;Ambulation Toilet Transfer Details (indicate cue type and reason): simulated with in room ambulation from eob to sink back to bed         Functional mobility during ADLs: Min guard General ADL Comments: pt. moving well and tolerated standing grooming tasks.  declined recliner as she was due for pain meds soon and "didnt want to risk doing ot much and having pain"     Vision       Perception     Praxis      Cognition Arousal/Alertness: Awake/alert Behavior During Therapy: WFL for tasks assessed/performed Overall Cognitive Status: Within Functional Limits for tasks assessed                                          Exercises     Shoulder Instructions       General Comments      Pertinent Vitals/ Pain       Pain Assessment: No/denies pain  Home Living  Prior Functioning/Environment              Frequency  Min 2X/week        Progress Toward Goals  OT Goals(current goals can now be found in the care plan section)  Progress towards OT goals: Progressing toward goals     Plan Discharge plan remains appropriate    Co-evaluation                 AM-PAC OT "6 Clicks" Daily Activity     Outcome Measure   Help from another person eating meals?: None Help from another person taking care of personal grooming?: A Little Help from another person toileting, which includes using toliet, bedpan, or urinal?: A Little Help from another person bathing (including washing, rinsing, drying)?: A Little Help from another person to put on and taking off regular upper body  clothing?: None Help from another person to put on and taking off regular lower body clothing?: A Little 6 Click Score: 20    End of Session Equipment Utilized During Treatment: Gait belt;Rolling walker  OT Visit Diagnosis: Unsteadiness on feet (R26.81);Other abnormalities of gait and mobility (R26.89);Muscle weakness (generalized) (M62.81)   Activity Tolerance Patient tolerated treatment well   Patient Left in bed;with call bell/phone within reach   Nurse Communication          Time: [id number] OT Time Calculation (min): 13 min  Charges: OT General Charges $OT Visit: 1 Visit OT Treatments $Self Care/Home Management : 8-22 mins  Sonia Baller, COTA/L Acute Rehabilitation [phone]   Tanya Nones 04/26/2021, 12:20 PM

## 2021-04-29 ENCOUNTER — Ambulatory Visit: Payer: HMO | Attending: Family Medicine | Admitting: Physical Therapy

## 2021-04-29 DIAGNOSIS — R5381 Other malaise: Secondary | ICD-10-CM | POA: Diagnosis not present

## 2021-04-29 DIAGNOSIS — I10 Essential (primary) hypertension: Secondary | ICD-10-CM | POA: Diagnosis not present

## 2021-04-29 DIAGNOSIS — E1142 Type 2 diabetes mellitus with diabetic polyneuropathy: Secondary | ICD-10-CM | POA: Diagnosis not present

## 2021-04-29 DIAGNOSIS — D500 Iron deficiency anemia secondary to blood loss (chronic): Secondary | ICD-10-CM | POA: Diagnosis not present

## 2021-05-02 ENCOUNTER — Ambulatory Visit: Payer: HMO | Admitting: Physical Therapy

## 2021-05-02 ENCOUNTER — Inpatient Hospital Stay: Payer: HMO

## 2021-05-02 DIAGNOSIS — G894 Chronic pain syndrome: Secondary | ICD-10-CM | POA: Diagnosis not present

## 2021-05-02 DIAGNOSIS — R5381 Other malaise: Secondary | ICD-10-CM | POA: Diagnosis not present

## 2021-05-02 DIAGNOSIS — M48061 Spinal stenosis, lumbar region without neurogenic claudication: Secondary | ICD-10-CM | POA: Diagnosis not present

## 2021-05-02 DIAGNOSIS — E1142 Type 2 diabetes mellitus with diabetic polyneuropathy: Secondary | ICD-10-CM | POA: Diagnosis not present

## 2021-05-03 ENCOUNTER — Telehealth: Payer: Self-pay

## 2021-05-03 DIAGNOSIS — M546 Pain in thoracic spine: Secondary | ICD-10-CM

## 2021-05-03 DIAGNOSIS — G8929 Other chronic pain: Secondary | ICD-10-CM

## 2021-05-03 NOTE — Telephone Encounter (Signed)
Patient calls nurse line requesting a referral to Neurology to see Dr. Kristeen Miss. Patient has a hospital FU scheduled with PCP on 05/13/2021.

## 2021-05-04 DIAGNOSIS — E1142 Type 2 diabetes mellitus with diabetic polyneuropathy: Secondary | ICD-10-CM | POA: Diagnosis not present

## 2021-05-04 DIAGNOSIS — H3530 Unspecified macular degeneration: Secondary | ICD-10-CM | POA: Diagnosis not present

## 2021-05-04 DIAGNOSIS — J4520 Mild intermittent asthma, uncomplicated: Secondary | ICD-10-CM | POA: Diagnosis not present

## 2021-05-04 DIAGNOSIS — M48061 Spinal stenosis, lumbar region without neurogenic claudication: Secondary | ICD-10-CM | POA: Diagnosis not present

## 2021-05-04 DIAGNOSIS — F4323 Adjustment disorder with mixed anxiety and depressed mood: Secondary | ICD-10-CM | POA: Diagnosis not present

## 2021-05-04 DIAGNOSIS — G43909 Migraine, unspecified, not intractable, without status migrainosus: Secondary | ICD-10-CM | POA: Diagnosis not present

## 2021-05-04 DIAGNOSIS — E785 Hyperlipidemia, unspecified: Secondary | ICD-10-CM | POA: Diagnosis not present

## 2021-05-04 DIAGNOSIS — R32 Unspecified urinary incontinence: Secondary | ICD-10-CM | POA: Diagnosis not present

## 2021-05-04 DIAGNOSIS — I10 Essential (primary) hypertension: Secondary | ICD-10-CM | POA: Diagnosis not present

## 2021-05-04 DIAGNOSIS — D500 Iron deficiency anemia secondary to blood loss (chronic): Secondary | ICD-10-CM | POA: Diagnosis not present

## 2021-05-04 DIAGNOSIS — Z9181 History of falling: Secondary | ICD-10-CM | POA: Diagnosis not present

## 2021-05-04 DIAGNOSIS — G894 Chronic pain syndrome: Secondary | ICD-10-CM | POA: Diagnosis not present

## 2021-05-06 ENCOUNTER — Encounter: Payer: HMO | Admitting: Physical Medicine and Rehabilitation

## 2021-05-06 NOTE — Telephone Encounter (Signed)
Per patient, PT suggested NS referral and a friend specifically recommended dr. Ellene Route.  MRI done in Dec 2021 did not show any surgical problem.  Has not had advanced imaging since the dramatic increase in her pain.  Wants second (third since she has seen neuro) opinion.

## 2021-05-07 DIAGNOSIS — D509 Iron deficiency anemia, unspecified: Secondary | ICD-10-CM | POA: Diagnosis not present

## 2021-05-07 DIAGNOSIS — R1013 Epigastric pain: Secondary | ICD-10-CM | POA: Diagnosis not present

## 2021-05-07 DIAGNOSIS — K219 Gastro-esophageal reflux disease without esophagitis: Secondary | ICD-10-CM | POA: Diagnosis not present

## 2021-05-07 DIAGNOSIS — Z7689 Persons encountering health services in other specified circumstances: Secondary | ICD-10-CM | POA: Diagnosis not present

## 2021-05-07 DIAGNOSIS — R112 Nausea with vomiting, unspecified: Secondary | ICD-10-CM | POA: Diagnosis not present

## 2021-05-07 DIAGNOSIS — K269 Duodenal ulcer, unspecified as acute or chronic, without hemorrhage or perforation: Secondary | ICD-10-CM | POA: Diagnosis not present

## 2021-05-08 ENCOUNTER — Other Ambulatory Visit: Payer: Self-pay

## 2021-05-08 ENCOUNTER — Ambulatory Visit (INDEPENDENT_AMBULATORY_CARE_PROVIDER_SITE_OTHER): Payer: HMO | Admitting: Family Medicine

## 2021-05-08 ENCOUNTER — Encounter: Payer: Self-pay | Admitting: Family Medicine

## 2021-05-08 DIAGNOSIS — K279 Peptic ulcer, site unspecified, unspecified as acute or chronic, without hemorrhage or perforation: Secondary | ICD-10-CM

## 2021-05-08 DIAGNOSIS — G8929 Other chronic pain: Secondary | ICD-10-CM | POA: Diagnosis not present

## 2021-05-08 DIAGNOSIS — E43 Unspecified severe protein-calorie malnutrition: Secondary | ICD-10-CM | POA: Diagnosis not present

## 2021-05-08 DIAGNOSIS — G9059 Complex regional pain syndrome I of other specified site: Secondary | ICD-10-CM | POA: Diagnosis not present

## 2021-05-08 DIAGNOSIS — M546 Pain in thoracic spine: Secondary | ICD-10-CM | POA: Diagnosis not present

## 2021-05-08 MED ORDER — TRAMADOL HCL 50 MG PO TABS: 50.0000 mg | ORAL_TABLET | Freq: Four times a day (QID) | ORAL | 1 refills | Status: DC | PRN

## 2021-05-08 MED ORDER — DICLOFENAC SODIUM 1 % EX GEL: 2.0000 g | Freq: Four times a day (QID) | CUTANEOUS | 2 refills | Status: DC

## 2021-05-08 NOTE — Patient Instructions (Addendum)
You can take up to four tramadol a day.   You can use the voltaren gel on your back up to four times per day.  You can keep taking the tylenol with the tramadol I am glad Dr. Collene Mares caught our mistake.  I am glad you are on the right meds now. Call me next week to give me an update.   The muscle relaxer is up to three times per day.  Decide which meds are helping.  No sense in taking a medicine that doesn't help. Eat - I am worried about acute malnutrition. Make an appointment to see me in 2-3 weeks.

## 2021-05-09 ENCOUNTER — Encounter: Payer: Self-pay | Admitting: Family Medicine

## 2021-05-09 ENCOUNTER — Telehealth: Payer: Self-pay

## 2021-05-09 DIAGNOSIS — G8929 Other chronic pain: Secondary | ICD-10-CM

## 2021-05-09 DIAGNOSIS — E46 Unspecified protein-calorie malnutrition: Secondary | ICD-10-CM | POA: Insufficient documentation

## 2021-05-09 MED ORDER — OXYCODONE-ACETAMINOPHEN 2.5-325 MG PO TABS: 1.0000 | ORAL_TABLET | Freq: Three times a day (TID) | ORAL | 0 refills | Status: DC | PRN

## 2021-05-09 NOTE — Assessment & Plan Note (Signed)
25-30 lb weight loss in las 1 month.

## 2021-05-09 NOTE — Telephone Encounter (Signed)
Itching all over almost certainly due to tramadol.  Added tramadol to her allergy list.  Again complains bitterly of pain.  States it is "intollerable" and that she is "desperate"  I will put her on low dose oxy and not escalate.

## 2021-05-09 NOTE — Progress Notes (Signed)
    SUBJECTIVE:   CHIEF COMPLAINT / HPI:   Back pain and more. Back pain is her main issue with me.  This is the same, difficult to understand back pain that has been plaguing her for months.  I have come to conceptualize it as complex regional pain syndrome.  She has had extensive WU and multiple treatments.  Largely, these interventions have not worked.  The back pain is worse than it was two months ago.  She describes the pain as intollerable.  While she talked about cough on the phone in scheduling this appointment, she denies fever and significant cough. FU hospitalization with GI bleed, worsened by NSAIDs.  Seen by Dr. Collene Mares yesterday for worsening epigastric pain.  Dr. Collene Mares who caught an important error on our part.  (I have notified the providers involved and we are approaching this event as an important near miss.)  Inexplicably, she was [facility] without PPI or other acid reducing meds.  Dr. Collene Mares began her on PPI, H2 blocker and sucralfate (my med list updated.)  Spoke with Dr. Collene Mares who also checked labs and Hgb stable at 9.5  Patient religiously now checks stools.  No black, red or tarry stools. Acute malnourishment.  Patient has lost 25-30 lbs since prior to hospitalization.  Was eating better until her recent flare of epigastric pain.  Knows about need to eat more.    OBJECTIVE:   BP 135/76   Pulse (!) 111   Ht 4\' 11"  (1.499 m)   Wt 155 lb 6.4 oz (70.5 kg)   SpO2 97%   BMI 31.39 kg/m   VS including pulse and weight noted.   Lungs clear Cardiac, mild tachy.  RRR without m or g Abd benign.    ASSESSMENT/PLAN:   Complex regional pain syndrome I of other specified site My best conceptualization of her now chronic back pain.  States she has not tried Voltarin gel.  I also reluctantly agreed to treat with tramadol.  As of this typing, she called the next day complaining of diffuse itching.  Added tramadol to her allergy list and prescribed very low dose oxycodone.  Chronic  right-sided thoracic back pain Best anatomic description of her pain.    Peptic ulcer Now back on appropriate therapy.  No evidence of active bleeding.  Protein-calorie malnutrition ([place]) 25-30 lb weight loss in las 1 month.     Debra Resides, MD [place]

## 2021-05-09 NOTE — Assessment & Plan Note (Signed)
Now back on appropriate therapy.  No evidence of active bleeding.

## 2021-05-09 NOTE — Telephone Encounter (Signed)
Patient calls nurse line regarding medication reaction to Tramadol. Patient reports that she has a "terrible itch all over"   Patient has taken allergra, but is still experiencing itching. Denies SHOB, CP, difficulty swallowing.   Provided patient with ED precautions. Please advise medication management for patient.   Talbot Grumbling, RN

## 2021-05-09 NOTE — Assessment & Plan Note (Signed)
Best anatomic description of her pain.

## 2021-05-09 NOTE — Telephone Encounter (Signed)
Patient calls nurse line stating that [facility] not have oxycodone-acetaminophen rx in stock.   Patient is requesting that new rx be sent to the [facility] in [place].   Called and canceled rx at [facility].   Please send new rx to pended pharmacy.   Talbot Grumbling, RN

## 2021-05-09 NOTE — Telephone Encounter (Signed)
Done

## 2021-05-09 NOTE — Addendum Note (Signed)
Addended by: Talbot Grumbling on: 05/09/2021 03:31 PM   Modules accepted: Orders

## 2021-05-09 NOTE — Assessment & Plan Note (Signed)
My best conceptualization of her now chronic back pain.  States she has not tried Voltarin gel.  I also reluctantly agreed to treat with tramadol.  As of this typing, she called the next day complaining of diffuse itching.  Added tramadol to her allergy list and prescribed very low dose oxycodone.

## 2021-05-09 NOTE — Addendum Note (Signed)
Addended by: Zenia Resides on: 05/09/2021 03:36 PM   Modules accepted: Orders

## 2021-05-10 DIAGNOSIS — F4323 Adjustment disorder with mixed anxiety and depressed mood: Secondary | ICD-10-CM | POA: Diagnosis not present

## 2021-05-10 DIAGNOSIS — G894 Chronic pain syndrome: Secondary | ICD-10-CM | POA: Diagnosis not present

## 2021-05-10 DIAGNOSIS — H3530 Unspecified macular degeneration: Secondary | ICD-10-CM | POA: Diagnosis not present

## 2021-05-10 DIAGNOSIS — I10 Essential (primary) hypertension: Secondary | ICD-10-CM | POA: Diagnosis not present

## 2021-05-10 DIAGNOSIS — M48061 Spinal stenosis, lumbar region without neurogenic claudication: Secondary | ICD-10-CM | POA: Diagnosis not present

## 2021-05-10 DIAGNOSIS — D500 Iron deficiency anemia secondary to blood loss (chronic): Secondary | ICD-10-CM | POA: Diagnosis not present

## 2021-05-10 DIAGNOSIS — Z9181 History of falling: Secondary | ICD-10-CM | POA: Diagnosis not present

## 2021-05-10 DIAGNOSIS — J4520 Mild intermittent asthma, uncomplicated: Secondary | ICD-10-CM | POA: Diagnosis not present

## 2021-05-10 DIAGNOSIS — E1142 Type 2 diabetes mellitus with diabetic polyneuropathy: Secondary | ICD-10-CM | POA: Diagnosis not present

## 2021-05-10 DIAGNOSIS — E785 Hyperlipidemia, unspecified: Secondary | ICD-10-CM | POA: Diagnosis not present

## 2021-05-10 DIAGNOSIS — G43909 Migraine, unspecified, not intractable, without status migrainosus: Secondary | ICD-10-CM | POA: Diagnosis not present

## 2021-05-10 DIAGNOSIS — R32 Unspecified urinary incontinence: Secondary | ICD-10-CM | POA: Diagnosis not present

## 2021-05-13 ENCOUNTER — Ambulatory Visit: Payer: HMO | Admitting: Family Medicine

## 2021-05-14 ENCOUNTER — Telehealth: Payer: Self-pay

## 2021-05-14 ENCOUNTER — Other Ambulatory Visit: Payer: Self-pay | Admitting: Physical Medicine and Rehabilitation

## 2021-05-14 ENCOUNTER — Ambulatory Visit: Payer: HMO

## 2021-05-14 DIAGNOSIS — E43 Unspecified severe protein-calorie malnutrition: Secondary | ICD-10-CM

## 2021-05-14 DIAGNOSIS — G9059 Complex regional pain syndrome I of other specified site: Secondary | ICD-10-CM

## 2021-05-14 DIAGNOSIS — G43909 Migraine, unspecified, not intractable, without status migrainosus: Secondary | ICD-10-CM

## 2021-05-14 MED ORDER — METOPROLOL SUCCINATE ER 25 MG PO TB24: 25.0000 mg | ORAL_TABLET | Freq: Every day | ORAL | 3 refills | Status: DC

## 2021-05-14 MED ORDER — DULOXETINE HCL 30 MG PO CSDR: 30.0000 mg | DELAYED_RELEASE_CAPSULE | Freq: Every day | ORAL | 6 refills | Status: DC

## 2021-05-14 NOTE — Telephone Encounter (Signed)
Ordered as requested.

## 2021-05-14 NOTE — Telephone Encounter (Signed)
Yes, she should start back on metoprolol Duloxetine might help with pain, but she should start back on a lower dose.  The sixty mg dose is too high to start at.  I have sent in refills for both metoprolol and a lower dose duloxetine.

## 2021-05-14 NOTE — Telephone Encounter (Signed)
[employer] RN calls nurse line with [name] on the phone. [employer] will pay for a custodial [place] worker to help with cleaning and meals. This service is to help the patient get back on her feet post discharge. Please place a referral for custodial worker to [employer].

## 2021-05-14 NOTE — Telephone Encounter (Signed)
Patient call back with [employer] RN Suanne Marker to discuss medications. The patient reported to Suanne Marker she has been off Cymbalta since "sometime" in August. Patient reports she still has two full bottles left and wants to know PCP opinion on stopping verse restarting. Patient reports she does not remember why she stopped taking. Patient is also out of Metoprolol and needs a refill. Patient has also started taking OTC Allegra and wants PCP to be aware.

## 2021-05-15 ENCOUNTER — Ambulatory Visit: Payer: HMO | Admitting: Family Medicine

## 2021-05-15 NOTE — Telephone Encounter (Signed)
Spoke with patient. Patient informed to restart Cymbalta, however at a lower dose. Cymbalta and Metoprolol to the pharmacy.

## 2021-05-16 DIAGNOSIS — Z9181 History of falling: Secondary | ICD-10-CM | POA: Diagnosis not present

## 2021-05-16 DIAGNOSIS — E1142 Type 2 diabetes mellitus with diabetic polyneuropathy: Secondary | ICD-10-CM | POA: Diagnosis not present

## 2021-05-16 DIAGNOSIS — G43909 Migraine, unspecified, not intractable, without status migrainosus: Secondary | ICD-10-CM | POA: Diagnosis not present

## 2021-05-16 DIAGNOSIS — M48061 Spinal stenosis, lumbar region without neurogenic claudication: Secondary | ICD-10-CM | POA: Diagnosis not present

## 2021-05-16 DIAGNOSIS — G894 Chronic pain syndrome: Secondary | ICD-10-CM | POA: Diagnosis not present

## 2021-05-16 DIAGNOSIS — R32 Unspecified urinary incontinence: Secondary | ICD-10-CM | POA: Diagnosis not present

## 2021-05-16 DIAGNOSIS — J4520 Mild intermittent asthma, uncomplicated: Secondary | ICD-10-CM | POA: Diagnosis not present

## 2021-05-16 DIAGNOSIS — E785 Hyperlipidemia, unspecified: Secondary | ICD-10-CM | POA: Diagnosis not present

## 2021-05-16 DIAGNOSIS — D500 Iron deficiency anemia secondary to blood loss (chronic): Secondary | ICD-10-CM | POA: Diagnosis not present

## 2021-05-16 DIAGNOSIS — I10 Essential (primary) hypertension: Secondary | ICD-10-CM | POA: Diagnosis not present

## 2021-05-16 DIAGNOSIS — F4323 Adjustment disorder with mixed anxiety and depressed mood: Secondary | ICD-10-CM | POA: Diagnosis not present

## 2021-05-16 DIAGNOSIS — H3530 Unspecified macular degeneration: Secondary | ICD-10-CM | POA: Diagnosis not present

## 2021-05-21 ENCOUNTER — Telehealth: Payer: Self-pay | Admitting: *Deleted

## 2021-05-21 ENCOUNTER — Ambulatory Visit: Payer: HMO

## 2021-05-21 NOTE — Telephone Encounter (Signed)
Received a voicemail from [facility] asking for verbal orders for increased PT treatments.  2x a week for 3 weeks.  LM for Harriet Pho at [phone] with verbal ok.  Will forward to MD as an FYI.  Dajai Wahlert,CMA

## 2021-05-21 NOTE — Telephone Encounter (Signed)
Noted and agree. 

## 2021-05-22 ENCOUNTER — Ambulatory Visit (INDEPENDENT_AMBULATORY_CARE_PROVIDER_SITE_OTHER): Payer: HMO | Admitting: Family Medicine

## 2021-05-22 ENCOUNTER — Other Ambulatory Visit: Payer: Self-pay

## 2021-05-22 ENCOUNTER — Telehealth: Payer: Self-pay | Admitting: *Deleted

## 2021-05-22 VITALS — BP 146/68 | HR 95 | Ht 59.0 in | Wt 161.0 lb

## 2021-05-22 DIAGNOSIS — K279 Peptic ulcer, site unspecified, unspecified as acute or chronic, without hemorrhage or perforation: Secondary | ICD-10-CM | POA: Diagnosis not present

## 2021-05-22 DIAGNOSIS — G8929 Other chronic pain: Secondary | ICD-10-CM | POA: Diagnosis not present

## 2021-05-22 DIAGNOSIS — D500 Iron deficiency anemia secondary to blood loss (chronic): Secondary | ICD-10-CM

## 2021-05-22 DIAGNOSIS — M546 Pain in thoracic spine: Secondary | ICD-10-CM

## 2021-05-22 DIAGNOSIS — E440 Moderate protein-calorie malnutrition: Secondary | ICD-10-CM

## 2021-05-22 DIAGNOSIS — Z23 Encounter for immunization: Secondary | ICD-10-CM | POA: Diagnosis not present

## 2021-05-22 DIAGNOSIS — G9059 Complex regional pain syndrome I of other specified site: Secondary | ICD-10-CM

## 2021-05-22 LAB — POCT HEMOGLOBIN: Hemoglobin: 7 g/dL — AB (ref 11–14.6)

## 2021-05-22 MED ORDER — DULOXETINE HCL 30 MG PO CPEP
30.0000 mg | ORAL_CAPSULE | Freq: Every day | ORAL | 3 refills | Status: DC
Start: 2021-05-22 — End: 2021-09-11

## 2021-05-22 NOTE — Patient Instructions (Addendum)
Allegra is fine.  The other two antihistamines you can try are claritin and zyrtec.   OK to use 1 or 2% hydrocortisone cream in ears.  Available OTC.   Take OTC ferrous sulfate one pill every other day.   Please see me again in four to six weeks.

## 2021-05-22 NOTE — Telephone Encounter (Signed)
Rx resent.  I hope I have it right this time.

## 2021-05-22 NOTE — Telephone Encounter (Signed)
Fax received from pharmacy for drizalma 30 mg sprinkle DR capsules, states that plan required generic medication to be dispensed.  Please send in generic or call plan for PA.  Did not see this medication on pts med list.Stewart Pimenta Katharina Caper, CMA

## 2021-05-23 ENCOUNTER — Encounter: Payer: Self-pay | Admitting: Family Medicine

## 2021-05-23 NOTE — Progress Notes (Signed)
    SUBJECTIVE:   CHIEF COMPLAINT / HPI:   FU back pain, anemia, GI bleed and weight loss. [name] is feeling some better.  Much less epigastric pain.  Tires quite easily.  Still with severe right post thoracic back pain that the pain meds barely keep in check.  Issues: Peptic ulcer disease. No further black stools Much less epigastric discomfort.  No nausea or vomiting.  Back on appropriate meds Blood loss anemia.  No symptoms of ongoing blood loss.  Not currently on iron.  She does tire easily. Protein calorie malnutrition.  Eating better.  6lb wt gain noted.   Back pain.  Has yet to restart the duloxetine but plans on doing so.  Physical therapy should start tomorrow.  Also has appointment on 11/11 with pain center. Asks if allegra is OK to take for generalize pruritis.  She does not want anything that increases her bleeding risk.      OBJECTIVE:   BP (!) 146/68   Pulse 95   Ht 4\' 11"  (1.499 m)   Wt 161 lb (73 kg)   SpO2 96%   BMI 32.52 kg/m   Lungs clear Cardiac RRR without m or g Abd benign Back.  No rash.  Hyperesthesia over Rt post shoulder blade area. Skin color improved since last visit.  ASSESSMENT/PLAN:   Iron deficiency anemia due to chronic blood loss I was surprised by Hgb=7.0 because her general appearance is better.  Restart iron.  Given low hgb, recheck in 3 weeks. (Hgb resulted after AVS printed.)  Chronic right-sided thoracic back pain Restart duloxetine.  Begin PT.  FU with pain center.  Protein-calorie malnutrition ([place]) Improved weight gain.    Peptic ulcer Continue meds.  Start iron.  FU 3 weeks.  Sooner prn symptoms of bleeding.  She is aware that iron will cause stools to be black.     Debra Resides, MD [place]

## 2021-05-23 NOTE — Assessment & Plan Note (Signed)
Improved weight gain.

## 2021-05-23 NOTE — Assessment & Plan Note (Signed)
I was surprised by Hgb=7.0 because her general appearance is better.  Restart iron.  Given low hgb, recheck in 3 weeks. (Hgb resulted after AVS printed.)

## 2021-05-23 NOTE — Assessment & Plan Note (Signed)
Continue meds.  Start iron.  FU 3 weeks.  Sooner prn symptoms of bleeding.  She is aware that iron will cause stools to be black.

## 2021-05-23 NOTE — Assessment & Plan Note (Signed)
Restart duloxetine.  Begin PT.  FU with pain center.

## 2021-05-28 ENCOUNTER — Telehealth: Payer: Self-pay

## 2021-05-28 ENCOUNTER — Ambulatory Visit: Payer: HMO

## 2021-05-28 DIAGNOSIS — G8929 Other chronic pain: Secondary | ICD-10-CM

## 2021-05-28 NOTE — Telephone Encounter (Signed)
Patient calls nurse line regarding issues with percocet. Patient picked up 80 tablets of percocet on 05/09/21. Pharmacy is currently out of current strength. Pharmacist also reports that he would not be able to fill the remainder until 10/23. Pharmacist states that new prescription would have to be sent with a new dosage or new directions in order for insurance to pay prior to 10/23.  Please advise.   Talbot Grumbling, RN

## 2021-05-28 NOTE — Telephone Encounter (Signed)
Percocet 2.5/325 prescribed as q8h.  80 tabs dispensed on 05/09/21 should last 26.67 days.  She is out early because she took the medication too frequently.  This issue is separate from the problem of the pharmacy being out of that dose of percocet.  Will call in the morning and address the issue of her taking this narcotic more than prescribed.

## 2021-05-29 MED ORDER — OXYCODONE-ACETAMINOPHEN 2.5-325 MG PO TABS: 1.0000 | ORAL_TABLET | Freq: Three times a day (TID) | ORAL | 0 refills | Status: DC | PRN

## 2021-05-29 NOTE — Telephone Encounter (Signed)
Called.  She still has percocet left.  Sent in new Rx to be filled no earlier that 10/23.

## 2021-06-03 ENCOUNTER — Other Ambulatory Visit: Payer: Self-pay | Admitting: Family Medicine

## 2021-06-04 ENCOUNTER — Ambulatory Visit: Payer: HMO

## 2021-06-04 ENCOUNTER — Other Ambulatory Visit: Payer: Self-pay | Admitting: Family Medicine

## 2021-06-11 ENCOUNTER — Other Ambulatory Visit: Payer: Self-pay

## 2021-06-11 ENCOUNTER — Encounter: Payer: Self-pay | Admitting: Family Medicine

## 2021-06-11 ENCOUNTER — Ambulatory Visit: Payer: HMO

## 2021-06-11 ENCOUNTER — Ambulatory Visit (INDEPENDENT_AMBULATORY_CARE_PROVIDER_SITE_OTHER): Payer: HMO | Admitting: Family Medicine

## 2021-06-11 VITALS — BP 142/80 | HR 98 | Ht 59.0 in | Wt 161.6 lb

## 2021-06-11 DIAGNOSIS — G8929 Other chronic pain: Secondary | ICD-10-CM

## 2021-06-11 DIAGNOSIS — M546 Pain in thoracic spine: Secondary | ICD-10-CM | POA: Diagnosis not present

## 2021-06-11 DIAGNOSIS — K279 Peptic ulcer, site unspecified, unspecified as acute or chronic, without hemorrhage or perforation: Secondary | ICD-10-CM | POA: Diagnosis not present

## 2021-06-11 DIAGNOSIS — G43909 Migraine, unspecified, not intractable, without status migrainosus: Secondary | ICD-10-CM | POA: Diagnosis not present

## 2021-06-11 DIAGNOSIS — D500 Iron deficiency anemia secondary to blood loss (chronic): Secondary | ICD-10-CM

## 2021-06-11 DIAGNOSIS — I10 Essential (primary) hypertension: Secondary | ICD-10-CM

## 2021-06-11 DIAGNOSIS — E7800 Pure hypercholesterolemia, unspecified: Secondary | ICD-10-CM | POA: Diagnosis not present

## 2021-06-11 NOTE — Patient Instructions (Signed)
You look good. I will call with the blood count results. See me in one month. I am glad the physical therapy is helping.   Remember, about the best the body can do is to raise your hemoglobin one point per week.   Stay on all your stomach meds.

## 2021-06-12 ENCOUNTER — Encounter: Payer: Self-pay | Admitting: Family Medicine

## 2021-06-12 LAB — CBC
Hematocrit: 32.2 % — ABNORMAL LOW (ref 34.0–46.6)
Hemoglobin: 9.4 g/dL — ABNORMAL LOW (ref 11.1–15.9)
MCH: 23.7 pg — ABNORMAL LOW (ref 26.6–33.0)
MCHC: 29.2 g/dL — ABNORMAL LOW (ref 31.5–35.7)
MCV: 81 fL (ref 79–97)
Platelets: 216 10*3/uL (ref 150–450)
RBC: 3.97 x10E6/uL (ref 3.77–5.28)
RDW: 16.7 % — ABNORMAL HIGH (ref 11.7–15.4)
WBC: 5.6 10*3/uL (ref 3.4–10.8)

## 2021-06-12 MED ORDER — METOPROLOL SUCCINATE ER 25 MG PO TB24: 25.0000 mg | ORAL_TABLET | Freq: Every day | ORAL | 3 refills | Status: DC

## 2021-06-12 NOTE — Progress Notes (Signed)
    SUBJECTIVE:   CHIEF COMPLAINT / HPI:   FU hospitalization with significant GI bleed and resultant anemia.  Patient remains on all gi meds.  Clearly knows that NSAIDs are contraindicated.  Less stomach pain and feeling better.  Still with considerable fatigue.   No noted blood in stools.  Stools are dark with PO iron.  Last hgb= 7.0  For her chronic back pain.  Improving.  She is stretching out the low dose oxy.  My goal is to eventually wean off.  Pt is helping back pain and it continues.    OBJECTIVE:   BP (!) 142/80   Pulse 98   Ht 4\' 11"  (1.499 m)   Wt 161 lb 9.6 oz (73.3 kg)   SpO2 95%   BMI 32.64 kg/m   Less pale than previously Lungs clear Cardiac RRR without m or g   ASSESSMENT/PLAN:   Peptic ulcer Remains on therapy.  No evidence of recurrent bleed.  Iron deficiency anemia due to chronic blood loss Recheck hgb.  Results, very nicely improved with hgb =9.4.    Chronic right-sided thoracic back pain Improving.  Continue current low dose of oxy.     Debra Resides, MD [place]

## 2021-06-12 NOTE — Assessment & Plan Note (Signed)
Remains on therapy.  No evidence of recurrent bleed.

## 2021-06-12 NOTE — Assessment & Plan Note (Signed)
Improving.  Continue current low dose of oxy.

## 2021-06-12 NOTE — Assessment & Plan Note (Deleted)
LDL concerningly high at 154, but 10y ASCVD risk = 1.7%

## 2021-06-12 NOTE — Assessment & Plan Note (Signed)
Recheck hgb.  Results, very nicely improved with hgb =9.4.

## 2021-06-13 ENCOUNTER — Other Ambulatory Visit: Payer: Self-pay | Admitting: Family Medicine

## 2021-06-18 ENCOUNTER — Ambulatory Visit: Payer: HMO

## 2021-06-19 DIAGNOSIS — I10 Essential (primary) hypertension: Secondary | ICD-10-CM | POA: Diagnosis not present

## 2021-06-19 DIAGNOSIS — E785 Hyperlipidemia, unspecified: Secondary | ICD-10-CM | POA: Diagnosis not present

## 2021-06-19 DIAGNOSIS — G43909 Migraine, unspecified, not intractable, without status migrainosus: Secondary | ICD-10-CM | POA: Diagnosis not present

## 2021-06-19 DIAGNOSIS — D500 Iron deficiency anemia secondary to blood loss (chronic): Secondary | ICD-10-CM | POA: Diagnosis not present

## 2021-06-19 DIAGNOSIS — G894 Chronic pain syndrome: Secondary | ICD-10-CM | POA: Diagnosis not present

## 2021-06-19 DIAGNOSIS — R32 Unspecified urinary incontinence: Secondary | ICD-10-CM | POA: Diagnosis not present

## 2021-06-19 DIAGNOSIS — M48061 Spinal stenosis, lumbar region without neurogenic claudication: Secondary | ICD-10-CM | POA: Diagnosis not present

## 2021-06-19 DIAGNOSIS — E1142 Type 2 diabetes mellitus with diabetic polyneuropathy: Secondary | ICD-10-CM | POA: Diagnosis not present

## 2021-06-19 DIAGNOSIS — H3530 Unspecified macular degeneration: Secondary | ICD-10-CM | POA: Diagnosis not present

## 2021-06-19 DIAGNOSIS — Z9181 History of falling: Secondary | ICD-10-CM | POA: Diagnosis not present

## 2021-06-19 DIAGNOSIS — F4323 Adjustment disorder with mixed anxiety and depressed mood: Secondary | ICD-10-CM | POA: Diagnosis not present

## 2021-06-19 DIAGNOSIS — J4520 Mild intermittent asthma, uncomplicated: Secondary | ICD-10-CM | POA: Diagnosis not present

## 2021-06-21 ENCOUNTER — Telehealth: Payer: Self-pay

## 2021-06-21 NOTE — Telephone Encounter (Signed)
Spoke to pt regarding medication pickup and patient assistance re enrollment.   Pt can come sometime next week to pickup ozempic. Re-enrollment paperwork will also be in bag for pt to sign and provide proof of income.  Medication is labeled and ready in med room fridge.

## 2021-06-24 DIAGNOSIS — E1142 Type 2 diabetes mellitus with diabetic polyneuropathy: Secondary | ICD-10-CM | POA: Diagnosis not present

## 2021-06-24 DIAGNOSIS — G43909 Migraine, unspecified, not intractable, without status migrainosus: Secondary | ICD-10-CM | POA: Diagnosis not present

## 2021-06-24 DIAGNOSIS — H3530 Unspecified macular degeneration: Secondary | ICD-10-CM | POA: Diagnosis not present

## 2021-06-24 DIAGNOSIS — F4323 Adjustment disorder with mixed anxiety and depressed mood: Secondary | ICD-10-CM | POA: Diagnosis not present

## 2021-06-24 DIAGNOSIS — J4520 Mild intermittent asthma, uncomplicated: Secondary | ICD-10-CM | POA: Diagnosis not present

## 2021-06-24 DIAGNOSIS — G894 Chronic pain syndrome: Secondary | ICD-10-CM | POA: Diagnosis not present

## 2021-06-24 DIAGNOSIS — R32 Unspecified urinary incontinence: Secondary | ICD-10-CM | POA: Diagnosis not present

## 2021-06-24 DIAGNOSIS — M48061 Spinal stenosis, lumbar region without neurogenic claudication: Secondary | ICD-10-CM | POA: Diagnosis not present

## 2021-06-24 DIAGNOSIS — E785 Hyperlipidemia, unspecified: Secondary | ICD-10-CM | POA: Diagnosis not present

## 2021-06-24 DIAGNOSIS — D500 Iron deficiency anemia secondary to blood loss (chronic): Secondary | ICD-10-CM | POA: Diagnosis not present

## 2021-06-24 DIAGNOSIS — I10 Essential (primary) hypertension: Secondary | ICD-10-CM | POA: Diagnosis not present

## 2021-06-24 DIAGNOSIS — Z9181 History of falling: Secondary | ICD-10-CM | POA: Diagnosis not present

## 2021-06-27 ENCOUNTER — Other Ambulatory Visit: Payer: Self-pay

## 2021-06-27 DIAGNOSIS — M546 Pain in thoracic spine: Secondary | ICD-10-CM

## 2021-06-27 MED ORDER — OXYCODONE-ACETAMINOPHEN 2.5-325 MG PO TABS: 1.0000 | ORAL_TABLET | Freq: Three times a day (TID) | ORAL | 0 refills | Status: DC | PRN

## 2021-07-02 ENCOUNTER — Telehealth: Payer: Self-pay | Admitting: *Deleted

## 2021-07-02 NOTE — Telephone Encounter (Signed)
Leah called from [facility] asking for verbal orders for this patient to continue services for:  1x a week- 8 weeks.    LM for Leah with verbal ok on Dr.Hensel's behalf.  Janson Lamar,CMA

## 2021-07-11 ENCOUNTER — Ambulatory Visit (INDEPENDENT_AMBULATORY_CARE_PROVIDER_SITE_OTHER): Payer: HMO | Admitting: Family Medicine

## 2021-07-11 ENCOUNTER — Encounter: Payer: Self-pay | Admitting: Family Medicine

## 2021-07-11 ENCOUNTER — Other Ambulatory Visit: Payer: Self-pay

## 2021-07-11 ENCOUNTER — Ambulatory Visit (INDEPENDENT_AMBULATORY_CARE_PROVIDER_SITE_OTHER): Payer: HMO

## 2021-07-11 VITALS — BP 172/78 | HR 99 | Ht 59.0 in | Wt 163.2 lb

## 2021-07-11 DIAGNOSIS — I10 Essential (primary) hypertension: Secondary | ICD-10-CM | POA: Diagnosis not present

## 2021-07-11 DIAGNOSIS — D500 Iron deficiency anemia secondary to blood loss (chronic): Secondary | ICD-10-CM | POA: Diagnosis not present

## 2021-07-11 DIAGNOSIS — E119 Type 2 diabetes mellitus without complications: Secondary | ICD-10-CM | POA: Diagnosis not present

## 2021-07-11 DIAGNOSIS — M546 Pain in thoracic spine: Secondary | ICD-10-CM

## 2021-07-11 DIAGNOSIS — G8929 Other chronic pain: Secondary | ICD-10-CM | POA: Diagnosis not present

## 2021-07-11 DIAGNOSIS — G9059 Complex regional pain syndrome I of other specified site: Secondary | ICD-10-CM | POA: Diagnosis not present

## 2021-07-11 DIAGNOSIS — Z23 Encounter for immunization: Secondary | ICD-10-CM

## 2021-07-11 LAB — POCT GLYCOSYLATED HEMOGLOBIN (HGB A1C): Hemoglobin A1C: 5.3 % (ref 4.0–5.6)

## 2021-07-11 LAB — POCT HEMOGLOBIN: Hemoglobin: 9 g/dL — AB (ref 11–14.6)

## 2021-07-11 MED ORDER — ENALAPRIL MALEATE 10 MG PO TABS: 10.0000 mg | ORAL_TABLET | Freq: Every day | ORAL | 3 refills | Status: DC

## 2021-07-11 MED ORDER — BACLOFEN 20 MG PO TABS: 20.0000 mg | ORAL_TABLET | Freq: Three times a day (TID) | ORAL | 3 refills | Status: DC

## 2021-07-11 NOTE — Patient Instructions (Signed)
I am sorry about your back.  I hope the increased dose of baclofen helps.  I am running out of tricks.   Keep the neurology appointment.  Stay on the physical therapy. I will not decrease the narcotic/oxy this month.  Make sure you call for a refill several days in advance.   I wish there was more that I can do.

## 2021-07-11 NOTE — Progress Notes (Signed)
    SUBJECTIVE:   CHIEF COMPLAINT / HPI:   Two issues: Recheck Hgb.  Had upper GI bleed.  On iron.  No dyspepsia or change in stool color.  Energy has been returning slowly. POC Hgb=9.0.   Her peculiar right post thoracic pain had been slowly improving with time and physical therapy until 1 week ago when it acutely worsened.  She complains of severe pain spasm.  The worsening occurred shortly after a PT treatment in which the therapist massaged both her shoulder blade and neck.  Did not feel any acute pain at the time of the massage.  Pain and spasms are similar to what she has been experiencing over the last 5-6 months.  No new cough, fever,     OBJECTIVE:   BP (!) 172/78   Pulse 99   Ht 4\' 11"  (1.499 m)   Wt 163 lb 4 oz (74 kg)   SpO2 100%   BMI 32.97 kg/m   Lungs clear Cardiac RRR without m or g Skin color good, not pale.  ASSESSMENT/PLAN:   No problem-specific Assessment & Plan notes found for this encounter.     Zenia Resides, MD [place]

## 2021-07-11 NOTE — Assessment & Plan Note (Signed)
Now flared again.  I view this as a chronic pain syndrome akin to complex regional pain syndrome.  On cymbalta and baclofen and low dose narcotic, that I had intended to wean this visit.  I will not wean and simply hold dose as is.  Continue physical therapy.  Has appointment with neuro next week.  I will be happy to take any suggestions neuro has.

## 2021-07-11 NOTE — Assessment & Plan Note (Signed)
Even though HGB point of care is lower than last visit, I doubt any worsening.  Continue current treatment.

## 2021-07-17 DIAGNOSIS — M5416 Radiculopathy, lumbar region: Secondary | ICD-10-CM | POA: Diagnosis not present

## 2021-07-17 DIAGNOSIS — Z6832 Body mass index (BMI) 32.0-32.9, adult: Secondary | ICD-10-CM | POA: Diagnosis not present

## 2021-07-17 DIAGNOSIS — I10 Essential (primary) hypertension: Secondary | ICD-10-CM | POA: Diagnosis not present

## 2021-07-20 ENCOUNTER — Other Ambulatory Visit: Payer: Self-pay | Admitting: Family Medicine

## 2021-07-20 DIAGNOSIS — I10 Essential (primary) hypertension: Secondary | ICD-10-CM

## 2021-07-20 DIAGNOSIS — G43909 Migraine, unspecified, not intractable, without status migrainosus: Secondary | ICD-10-CM

## 2021-07-22 DIAGNOSIS — Z9181 History of falling: Secondary | ICD-10-CM | POA: Diagnosis not present

## 2021-07-22 DIAGNOSIS — M48061 Spinal stenosis, lumbar region without neurogenic claudication: Secondary | ICD-10-CM | POA: Diagnosis not present

## 2021-07-22 DIAGNOSIS — J4520 Mild intermittent asthma, uncomplicated: Secondary | ICD-10-CM | POA: Diagnosis not present

## 2021-07-22 DIAGNOSIS — E785 Hyperlipidemia, unspecified: Secondary | ICD-10-CM | POA: Diagnosis not present

## 2021-07-22 DIAGNOSIS — E1142 Type 2 diabetes mellitus with diabetic polyneuropathy: Secondary | ICD-10-CM | POA: Diagnosis not present

## 2021-07-22 DIAGNOSIS — Z7985 Long-term (current) use of injectable non-insulin antidiabetic drugs: Secondary | ICD-10-CM | POA: Diagnosis not present

## 2021-07-22 DIAGNOSIS — H3530 Unspecified macular degeneration: Secondary | ICD-10-CM | POA: Diagnosis not present

## 2021-07-22 DIAGNOSIS — I10 Essential (primary) hypertension: Secondary | ICD-10-CM | POA: Diagnosis not present

## 2021-07-22 DIAGNOSIS — G894 Chronic pain syndrome: Secondary | ICD-10-CM | POA: Diagnosis not present

## 2021-07-22 DIAGNOSIS — F4323 Adjustment disorder with mixed anxiety and depressed mood: Secondary | ICD-10-CM | POA: Diagnosis not present

## 2021-07-22 DIAGNOSIS — G43909 Migraine, unspecified, not intractable, without status migrainosus: Secondary | ICD-10-CM | POA: Diagnosis not present

## 2021-07-22 DIAGNOSIS — D500 Iron deficiency anemia secondary to blood loss (chronic): Secondary | ICD-10-CM | POA: Diagnosis not present

## 2021-07-23 DIAGNOSIS — M4724 Other spondylosis with radiculopathy, thoracic region: Secondary | ICD-10-CM | POA: Diagnosis not present

## 2021-07-23 DIAGNOSIS — M5414 Radiculopathy, thoracic region: Secondary | ICD-10-CM | POA: Diagnosis not present

## 2021-07-24 ENCOUNTER — Other Ambulatory Visit: Payer: Self-pay

## 2021-07-24 DIAGNOSIS — G8929 Other chronic pain: Secondary | ICD-10-CM

## 2021-07-24 MED ORDER — OXYCODONE-ACETAMINOPHEN 2.5-325 MG PO TABS: 1.0000 | ORAL_TABLET | Freq: Three times a day (TID) | ORAL | 0 refills | Status: DC | PRN

## 2021-08-09 DIAGNOSIS — J329 Chronic sinusitis, unspecified: Secondary | ICD-10-CM | POA: Diagnosis not present

## 2021-08-12 DIAGNOSIS — G894 Chronic pain syndrome: Secondary | ICD-10-CM | POA: Diagnosis not present

## 2021-08-12 DIAGNOSIS — J4520 Mild intermittent asthma, uncomplicated: Secondary | ICD-10-CM | POA: Diagnosis not present

## 2021-08-12 DIAGNOSIS — H3530 Unspecified macular degeneration: Secondary | ICD-10-CM | POA: Diagnosis not present

## 2021-08-12 DIAGNOSIS — E785 Hyperlipidemia, unspecified: Secondary | ICD-10-CM | POA: Diagnosis not present

## 2021-08-12 DIAGNOSIS — M48061 Spinal stenosis, lumbar region without neurogenic claudication: Secondary | ICD-10-CM | POA: Diagnosis not present

## 2021-08-12 DIAGNOSIS — E1142 Type 2 diabetes mellitus with diabetic polyneuropathy: Secondary | ICD-10-CM | POA: Diagnosis not present

## 2021-08-12 DIAGNOSIS — I10 Essential (primary) hypertension: Secondary | ICD-10-CM | POA: Diagnosis not present

## 2021-08-12 DIAGNOSIS — Z9181 History of falling: Secondary | ICD-10-CM | POA: Diagnosis not present

## 2021-08-12 DIAGNOSIS — D500 Iron deficiency anemia secondary to blood loss (chronic): Secondary | ICD-10-CM | POA: Diagnosis not present

## 2021-08-12 DIAGNOSIS — F4323 Adjustment disorder with mixed anxiety and depressed mood: Secondary | ICD-10-CM | POA: Diagnosis not present

## 2021-08-12 DIAGNOSIS — Z7985 Long-term (current) use of injectable non-insulin antidiabetic drugs: Secondary | ICD-10-CM | POA: Diagnosis not present

## 2021-08-12 DIAGNOSIS — G43909 Migraine, unspecified, not intractable, without status migrainosus: Secondary | ICD-10-CM | POA: Diagnosis not present

## 2021-08-13 ENCOUNTER — Other Ambulatory Visit: Payer: Self-pay

## 2021-08-13 ENCOUNTER — Encounter: Payer: Self-pay | Admitting: Family Medicine

## 2021-08-13 ENCOUNTER — Ambulatory Visit (INDEPENDENT_AMBULATORY_CARE_PROVIDER_SITE_OTHER): Payer: HMO | Admitting: Family Medicine

## 2021-08-13 DIAGNOSIS — G8929 Other chronic pain: Secondary | ICD-10-CM

## 2021-08-13 DIAGNOSIS — M546 Pain in thoracic spine: Secondary | ICD-10-CM | POA: Diagnosis not present

## 2021-08-13 DIAGNOSIS — F4323 Adjustment disorder with mixed anxiety and depressed mood: Secondary | ICD-10-CM

## 2021-08-13 DIAGNOSIS — D500 Iron deficiency anemia secondary to blood loss (chronic): Secondary | ICD-10-CM | POA: Diagnosis not present

## 2021-08-13 DIAGNOSIS — I10 Essential (primary) hypertension: Secondary | ICD-10-CM | POA: Diagnosis not present

## 2021-08-13 NOTE — Assessment & Plan Note (Signed)
Stable.  Great family support during Christmas season.

## 2021-08-13 NOTE — Patient Instructions (Signed)
I will call with blood test results.   I hope you get over your infection soon and the energy comes back.   Try some honey for your cough. I would prefer that you not lose any more weight for now.

## 2021-08-13 NOTE — Progress Notes (Signed)
° ° °  SUBJECTIVE:   CHIEF COMPLAINT / HPI:   Several issues: FU anemia from GI bleed.  No epigastric pain.  No bleeding noted.  Some nausea and constipation which she attributes to iron supplementaion.  Also has weakness see below Weakness, Recent severe illnesss and also anemia.  Deconditioning playing a role.  Not eating great.  Slow weight loss continues.  She is also in chronic pain and still grieving death of husband. Social.  Still lives alone.  Great family support. Chronic right thoracic back pain.  Improved after a cortisone injection.  Then worsened with cough. Cough and "sinsus infection."  Seen 4 days ago and prescribed doxycycline.  Slow improvement.  Cough agrevates her back pain.     OBJECTIVE:   BP 132/77    Pulse (!) 102    Ht 4\' 11"  (1.499 m)    Wt 160 lb 9.6 oz (72.8 kg)    SpO2 97%    BMI 32.44 kg/m   Color (palms and conjunctiva) good. Lungs clear Cardiac RRR without m or g Ext trace edema bilaterally.  ASSESSMENT/PLAN:   Adjustment reaction with anxiety and depression Stable.  Great family support during Christmas season.    Iron deficiency anemia due to chronic blood loss Recheck hgb and ferritin.  Chronic right-sided thoracic back pain Waxes and wanes.  Working with PM&R.     Zenia Resides, MD [place]

## 2021-08-13 NOTE — Assessment & Plan Note (Signed)
Waxes and wanes.  Working with PM&R.

## 2021-08-13 NOTE — Assessment & Plan Note (Signed)
>>  ASSESSMENT AND PLAN FOR ADJUSTMENT REACTION WITH ANXIETY AND DEPRESSION WRITTEN ON 08/13/2021  3:00 PM BY HENSEL, ELSIE LABOR, MD  Stable.  Great family support during Christmas season.

## 2021-08-13 NOTE — Assessment & Plan Note (Signed)
Recheck hgb and ferritin.

## 2021-08-14 LAB — CMP14+EGFR
ALT: 64 IU/L — ABNORMAL HIGH (ref 0–32)
AST: 57 IU/L — ABNORMAL HIGH (ref 0–40)
Albumin/Globulin Ratio: 1.6 (ref 1.2–2.2)
Albumin: 4.1 g/dL (ref 3.7–4.7)
Alkaline Phosphatase: 146 IU/L — ABNORMAL HIGH (ref 44–121)
BUN/Creatinine Ratio: 20 (ref 12–28)
BUN: 20 mg/dL (ref 8–27)
Bilirubin Total: <0.2 mg/dL (ref 0.0–1.2)
CO2: 23 mmol/L (ref 20–29)
Calcium: 9.9 mg/dL (ref 8.7–10.3)
Chloride: 100 mmol/L (ref 96–106)
Creatinine, Ser: 0.98 mg/dL (ref 0.57–1.00)
Globulin, Total: 2.5 g/dL (ref 1.5–4.5)
Glucose: 118 mg/dL — ABNORMAL HIGH (ref 70–99)
Potassium: 5.2 mmol/L (ref 3.5–5.2)
Sodium: 137 mmol/L (ref 134–144)
Total Protein: 6.6 g/dL (ref 6.0–8.5)
eGFR: 59 mL/min/{1.73_m2} — ABNORMAL LOW (ref >59)

## 2021-08-14 LAB — CBC
Hematocrit: 36.4 % (ref 34.0–46.6)
Hemoglobin: 11.6 g/dL (ref 11.1–15.9)
MCH: 24.2 pg — ABNORMAL LOW (ref 26.6–33.0)
MCHC: 31.9 g/dL (ref 31.5–35.7)
MCV: 76 fL — ABNORMAL LOW (ref 79–97)
Platelets: 248 10*3/uL (ref 150–450)
RBC: 4.8 x10E6/uL (ref 3.77–5.28)
RDW: 17.6 % — ABNORMAL HIGH (ref 11.7–15.4)
WBC: 7.6 10*3/uL (ref 3.4–10.8)

## 2021-08-14 LAB — FERRITIN: Ferritin: 23 ng/mL (ref 15–150)

## 2021-08-19 DIAGNOSIS — J329 Chronic sinusitis, unspecified: Secondary | ICD-10-CM | POA: Diagnosis not present

## 2021-08-21 ENCOUNTER — Other Ambulatory Visit: Payer: Self-pay | Admitting: Family Medicine

## 2021-08-28 ENCOUNTER — Other Ambulatory Visit: Payer: Self-pay

## 2021-08-28 DIAGNOSIS — M546 Pain in thoracic spine: Secondary | ICD-10-CM

## 2021-08-28 DIAGNOSIS — G8929 Other chronic pain: Secondary | ICD-10-CM

## 2021-08-28 MED ORDER — OXYCODONE-ACETAMINOPHEN 2.5-325 MG PO TABS: 1.0000 | ORAL_TABLET | Freq: Three times a day (TID) | ORAL | 0 refills | Status: DC | PRN

## 2021-08-30 DIAGNOSIS — E1142 Type 2 diabetes mellitus with diabetic polyneuropathy: Secondary | ICD-10-CM | POA: Diagnosis not present

## 2021-08-30 DIAGNOSIS — Z9181 History of falling: Secondary | ICD-10-CM | POA: Diagnosis not present

## 2021-08-30 DIAGNOSIS — G43909 Migraine, unspecified, not intractable, without status migrainosus: Secondary | ICD-10-CM | POA: Diagnosis not present

## 2021-08-30 DIAGNOSIS — I10 Essential (primary) hypertension: Secondary | ICD-10-CM | POA: Diagnosis not present

## 2021-08-30 DIAGNOSIS — D500 Iron deficiency anemia secondary to blood loss (chronic): Secondary | ICD-10-CM | POA: Diagnosis not present

## 2021-08-30 DIAGNOSIS — F4323 Adjustment disorder with mixed anxiety and depressed mood: Secondary | ICD-10-CM | POA: Diagnosis not present

## 2021-08-30 DIAGNOSIS — E785 Hyperlipidemia, unspecified: Secondary | ICD-10-CM | POA: Diagnosis not present

## 2021-08-30 DIAGNOSIS — Z7985 Long-term (current) use of injectable non-insulin antidiabetic drugs: Secondary | ICD-10-CM | POA: Diagnosis not present

## 2021-08-30 DIAGNOSIS — J4520 Mild intermittent asthma, uncomplicated: Secondary | ICD-10-CM | POA: Diagnosis not present

## 2021-08-30 DIAGNOSIS — G894 Chronic pain syndrome: Secondary | ICD-10-CM | POA: Diagnosis not present

## 2021-08-30 DIAGNOSIS — H3530 Unspecified macular degeneration: Secondary | ICD-10-CM | POA: Diagnosis not present

## 2021-08-30 DIAGNOSIS — M48061 Spinal stenosis, lumbar region without neurogenic claudication: Secondary | ICD-10-CM | POA: Diagnosis not present

## 2021-09-10 ENCOUNTER — Encounter: Payer: Self-pay | Admitting: Obstetrics and Gynecology

## 2021-09-10 ENCOUNTER — Other Ambulatory Visit: Payer: Self-pay

## 2021-09-10 ENCOUNTER — Ambulatory Visit: Payer: HMO | Admitting: Obstetrics and Gynecology

## 2021-09-10 ENCOUNTER — Other Ambulatory Visit (HOSPITAL_COMMUNITY)
Admission: RE | Admit: 2021-09-10 | Discharge: 2021-09-10 | Disposition: A | Discharge status: Home / Self Care | Payer: HMO | Source: Ambulatory Visit | Attending: Obstetrics and Gynecology | Admitting: Obstetrics and Gynecology

## 2021-09-10 VITALS — BP 124/77 | HR 109

## 2021-09-10 DIAGNOSIS — R350 Frequency of micturition: Secondary | ICD-10-CM | POA: Diagnosis not present

## 2021-09-10 DIAGNOSIS — N3281 Overactive bladder: Secondary | ICD-10-CM

## 2021-09-10 LAB — POCT URINALYSIS DIPSTICK
Appearance: NORMAL
Bilirubin, UA: NEGATIVE
Blood, UA: NEGATIVE
Glucose, UA: NEGATIVE
Ketones, UA: NEGATIVE
Nitrite, UA: NEGATIVE
Protein, UA: NEGATIVE
Spec Grav, UA: 1.015 (ref 1.010–1.025)
Urobilinogen, UA: 0.2 E.U./dL
pH, UA: 6 (ref 5.0–8.0)

## 2021-09-10 MED ORDER — MIRABEGRON ER 50 MG PO TB24: 50.0000 mg | ORAL_TABLET | Freq: Every day | ORAL | 11 refills | Status: DC

## 2021-09-10 NOTE — Progress Notes (Signed)
[facility] Return Visit  SUBJECTIVE  History of Present Illness: [name] is a 81 y.o. female seen in follow-up for mixed incontinence, Urge predominant.  Not currently on medication. Still having an issue with the incontinence, wearing lots of pads throughout the day. She would like to restart medication because she has changed insurance. Was previously on Myrbetriq 50mg  and this worked well for her (unable to take anticholinergics due to glaucoma).  Urine is cloudy, denies pain with urination. But has felt ill- hot and cold.    Past Medical History: Patient  has a past medical history of Asthma, Depression, Diabetes mellitus without complication ([place]), History of surgery on arm, Hypertension, Macular degeneration, Migraine, Pneumonia, and Urinary incontinence.   Past Surgical History: She  has a past surgical history that includes Appendectomy; Arm surgery; Breast cyst aspiration (12/20/2013); Breast biopsy (Right, 10/10/2009); Breast excisional biopsy (Left, 1998); Breast excisional biopsy (Right, 1993); Abdominal hysterectomy; IR US Guide Vasc Access Right (04/17/2021); IR Angiogram Visceral Selective (04/17/2021); IR EMBO ART  VEN HEMORR LYMPH EXTRAV  INC GUIDE ROADMAPPING (04/17/2021); IR Angiogram Selective Each Additional Vessel (04/17/2021); Radiology with anesthesia (N/A, 04/17/2021); Esophagogastroduodenoscopy (N/A, 04/17/2021); Esophagogastroduodenoscopy (N/A, 04/18/2021); submucosal injection (04/18/2021); and Hot hemostasis (N/A, 04/18/2021).   Medications: She has a current medication list which includes the following prescription(s): metoprolol succinate, mirabegron er, acetic acid-hydrocortisone, albuterol, artificial tear ointment, baclofen, benzonatate, diclofenac sodium, duloxetine, enalapril, famotidine, onetouch delica plus [id number], multivitamin with minerals, ondansetron, onetouch verio, oxycodone-acetaminophen, pantoprazole, rosuvastatin, ozempic (1 mg/dose), sucralfate,  and triamcinolone ointment.   Allergies: Patient is allergic to amoxicillin, azithromycin, butalbital, clindamycin hcl, januvia [sitagliptin], levaquin [levofloxacin in d5w], meperidine hcl, sulfamethoxazole, buspirone, ketorolac, lidocaine, macrodantin [nitrofurantoin macrocrystal], methocarbamol, tramadol, codeine phosphate, etodolac, gabapentin, naproxen, pentazocine-naloxone hcl, sumatriptan, and zonegran.   Social History: Patient  reports that she quit smoking about 30 years ago. Her smoking use included cigarettes. She started smoking about 64 years ago. She has a 66.00 pack-year smoking history. She has never used smokeless tobacco. She reports that she does not drink alcohol and does not use drugs.      OBJECTIVE     Physical Exam: Vitals:   09/10/21 1347  BP: 124/77  Pulse: (!) 109    Gen: No apparent distress, A&O x 3.  Detailed Urogynecologic Evaluation:  Deferred.    POC urine: small leukocytes, otherwise negative ASSESSMENT AND PLAN    [name] is a 81 y.o. with:  1. Overactive bladder    - will send urine for culture today - refilled Myrbetriq 50mg  daily -return 6 months or sooner if needed   Jaquita Folds, MD  Time spent: I spent 20 minutes dedicated to the care of this patient on the date of this encounter to include pre-visit review of records, face-to-face time with the patient discussing and post visit documentation.

## 2021-09-10 NOTE — Progress Notes (Signed)
Received notification from [employer] regarding RE-ENROLLMENT approval for [place]. Patient assistance approved UNTIL 07/10/22.  MEDICATION WILL SHIP TO OFFICE  Phone: [phone]

## 2021-09-11 ENCOUNTER — Telehealth: Payer: Self-pay

## 2021-09-11 DIAGNOSIS — G609 Hereditary and idiopathic neuropathy, unspecified: Secondary | ICD-10-CM

## 2021-09-11 DIAGNOSIS — K279 Peptic ulcer, site unspecified, unspecified as acute or chronic, without hemorrhage or perforation: Secondary | ICD-10-CM

## 2021-09-11 DIAGNOSIS — F419 Anxiety disorder, unspecified: Secondary | ICD-10-CM | POA: Insufficient documentation

## 2021-09-11 LAB — URINE CULTURE

## 2021-09-11 MED ORDER — PANTOPRAZOLE SODIUM 40 MG PO TBEC: 40.0000 mg | DELAYED_RELEASE_TABLET | Freq: Every day | ORAL | 3 refills | Status: DC

## 2021-09-11 MED ORDER — BUSPIRONE HCL 5 MG PO TABS: 5.0000 mg | ORAL_TABLET | Freq: Two times a day (BID) | ORAL | 3 refills | Status: DC | PRN

## 2021-09-11 MED ORDER — DULOXETINE HCL 30 MG PO CPEP: 30.0000 mg | ORAL_CAPSULE | Freq: Every day | ORAL | 3 refills | Status: DC

## 2021-09-11 NOTE — Telephone Encounter (Signed)
Patient returns call to nurse line. Informed of below. Scheduled with Dr. Andria Frames for next week on 2/8. Reiterated ED/UC precautions.   Patient verbalizes understanding.  Talbot Grumbling, RN

## 2021-09-11 NOTE — Telephone Encounter (Signed)
Please ask her to see me next week.  Likely fine that she does not go to urgent care in that many/most of her symptoms could be caused by abrupt stopping of her medications.

## 2021-09-11 NOTE — Telephone Encounter (Signed)
Patient LVM on nurse line returning call to Dr. Andria Frames. Patient states that she was speaking with Dr. Andria Frames and got disconnected.   Patient states that she was discussing new medication prescribed by Urology. Patient states that she was prescribed myrbetriq 50 mg.   Patient says that she does not feel that she needs to go to urgent care at this time.   Please advise if you are still needing to speak with patient or if you have a message you would like me to give her.   Talbot Grumbling, RN

## 2021-09-11 NOTE — Telephone Encounter (Signed)
Patient calls nurse line reporting sudden onset of weakness associated with elevated heart rate and confusion. Patient reports symptoms started yesterday at Urologist with a heart rate of 109. Patient reports yesterday she dropped a cup of tea and her medicine bottle "out of no where." Patient reports she was feeling "off" so her niece came and stayed with her last night. Patient reports she woke up today feeling better, however reports continued elevated heart rate ~119. Patient denies any SOB, chest pain, dizziness, nausea/vomiting, change in gait or speech. Patient is staying hydrated.   Patient reports she has not taken Cymbalta since 27 due to no refills at her pharmacy. Patient reports she is taking 60mg  now. Her pill bottle states Dr. Dahlia Client. Patient reports she does not know who that is.   Patient advised to go to [facility] or ED for symptoms. Patient reports she spoke to her niece and she will be able take her this afternoon.   Will forward to PCP for additional advisement and Cymbalta refill.   Discussed reasons to call 911- patient reports she would rather not do that.

## 2021-09-11 NOTE — Telephone Encounter (Signed)
Called patient.  Yes, she has been out of duloxetine (my recorded dose is 30 mg.)  Also out of pantoprazole.  And states she has been taking buspirone 5 mg bid prn anxiety.  Refilled all, encouraged to go to urgent care.  See me next week.

## 2021-09-13 DIAGNOSIS — M5114 Intervertebral disc disorders with radiculopathy, thoracic region: Secondary | ICD-10-CM | POA: Diagnosis not present

## 2021-09-18 ENCOUNTER — Encounter: Payer: Self-pay | Admitting: Family Medicine

## 2021-09-18 ENCOUNTER — Ambulatory Visit (INDEPENDENT_AMBULATORY_CARE_PROVIDER_SITE_OTHER): Payer: HMO | Admitting: Family Medicine

## 2021-09-18 ENCOUNTER — Other Ambulatory Visit: Payer: Self-pay

## 2021-09-18 DIAGNOSIS — F4323 Adjustment disorder with mixed anxiety and depressed mood: Secondary | ICD-10-CM | POA: Diagnosis not present

## 2021-09-18 DIAGNOSIS — G8929 Other chronic pain: Secondary | ICD-10-CM | POA: Diagnosis not present

## 2021-09-18 DIAGNOSIS — M546 Pain in thoracic spine: Secondary | ICD-10-CM

## 2021-09-18 DIAGNOSIS — L299 Pruritus, unspecified: Secondary | ICD-10-CM | POA: Diagnosis not present

## 2021-09-18 DIAGNOSIS — D500 Iron deficiency anemia secondary to blood loss (chronic): Secondary | ICD-10-CM

## 2021-09-18 DIAGNOSIS — G894 Chronic pain syndrome: Secondary | ICD-10-CM

## 2021-09-18 MED ORDER — OXYCODONE-ACETAMINOPHEN 2.5-325 MG PO TABS: 1.0000 | ORAL_TABLET | Freq: Four times a day (QID) | ORAL | 0 refills | Status: DC | PRN

## 2021-09-18 NOTE — Patient Instructions (Signed)
Ears look fine.  OTC cortisone cream ok. I increased your back pain medicine by one pill per day.  I do not see myself going any higher on that dose. Keep an eye on your weight and BP at home. I will call with blood count results. See me in three months.

## 2021-09-19 LAB — CBC
Hematocrit: 34.5 % (ref 34.0–46.6)
Hemoglobin: 10.8 g/dL — ABNORMAL LOW (ref 11.1–15.9)
MCH: 25.5 pg — ABNORMAL LOW (ref 26.6–33.0)
MCHC: 31.3 g/dL — ABNORMAL LOW (ref 31.5–35.7)
MCV: 81 fL (ref 79–97)
Platelets: 200 10*3/uL (ref 150–450)
RBC: 4.24 x10E6/uL (ref 3.77–5.28)
RDW: 18.4 % — ABNORMAL HIGH (ref 11.7–15.4)
WBC: 7.1 10*3/uL (ref 3.4–10.8)

## 2021-09-20 NOTE — Progress Notes (Signed)
° ° °  SUBJECTIVE:   CHIEF COMPLAINT / HPI:   Chronic back pain and more. Pain in her right mid back remains largely unchanged.  Clearly, this has turned into a chronic pain syndrome.  I prescribe low dose narcotics which help.  Pain management has done some injections.  Next appoinmtne with pain management is March. No new cough, fever sOB with this pain. Also has itching of both ears. She has not fallen recently.  She continues to get PT. Grief remains palpable.  She still lives alone. Wt is up 7 lbs.  This is good - she had been losing weight too fast.  Discussed that it is OK to resume slow weight loss at this point. GI bleed on iron. Generalized weakness.  No black stools.  No vomiting.    OBJECTIVE:   BP 128/82 (BP Location: Left Arm, Cuff Size: Large)    Pulse 99    Ht 4\' 11"  (1.499 m)    Wt 167 lb 6.4 oz (75.9 kg)    SpO2 96%    BMI 33.81 kg/m   She uncomfortably squirms throughout history taking and exam due to her back pain Lungs clear Cardiac RRR without m or g Abd benign.  ASSESSMENT/PLAN:   Chronic pain syndrome I did increase her percocet from three to four per day.  I do not plan any additional increase.  Chronic right-sided thoracic back pain See chronic pain problem.  I did increase percocet  Iron deficiency anemia due to chronic blood loss Rechecked labs.  Hgb slightly down (I interpret as stable)  Needs continued iron.  Adjustment reaction with anxiety and depression The struggle continues  Itching of ear, bilateral Refilled astringent.     Zenia Resides, MD [place]

## 2021-09-20 NOTE — Assessment & Plan Note (Signed)
Refilled astringent.

## 2021-09-20 NOTE — Assessment & Plan Note (Signed)
The struggle continues

## 2021-09-20 NOTE — Assessment & Plan Note (Signed)
See chronic pain problem.  I did increase percocet

## 2021-09-20 NOTE — Assessment & Plan Note (Signed)
Rechecked labs.  Hgb slightly down (I interpret as stable)  Needs continued iron.

## 2021-09-20 NOTE — Assessment & Plan Note (Signed)
>>  ASSESSMENT AND PLAN FOR ADJUSTMENT REACTION WITH ANXIETY AND DEPRESSION WRITTEN ON 09/20/2021  9:21 AM BY HENSEL, ELSIE LABOR, MD  The struggle continues

## 2021-09-20 NOTE — Assessment & Plan Note (Signed)
I did increase her percocet from three to four per day.  I do not plan any additional increase.

## 2021-09-23 DIAGNOSIS — H3530 Unspecified macular degeneration: Secondary | ICD-10-CM | POA: Diagnosis not present

## 2021-09-23 DIAGNOSIS — G894 Chronic pain syndrome: Secondary | ICD-10-CM | POA: Diagnosis not present

## 2021-09-23 DIAGNOSIS — E785 Hyperlipidemia, unspecified: Secondary | ICD-10-CM | POA: Diagnosis not present

## 2021-09-23 DIAGNOSIS — F4323 Adjustment disorder with mixed anxiety and depressed mood: Secondary | ICD-10-CM | POA: Diagnosis not present

## 2021-09-23 DIAGNOSIS — Z7985 Long-term (current) use of injectable non-insulin antidiabetic drugs: Secondary | ICD-10-CM | POA: Diagnosis not present

## 2021-09-23 DIAGNOSIS — Z9181 History of falling: Secondary | ICD-10-CM | POA: Diagnosis not present

## 2021-09-23 DIAGNOSIS — G43909 Migraine, unspecified, not intractable, without status migrainosus: Secondary | ICD-10-CM | POA: Diagnosis not present

## 2021-09-23 DIAGNOSIS — M48061 Spinal stenosis, lumbar region without neurogenic claudication: Secondary | ICD-10-CM | POA: Diagnosis not present

## 2021-09-23 DIAGNOSIS — J4520 Mild intermittent asthma, uncomplicated: Secondary | ICD-10-CM | POA: Diagnosis not present

## 2021-09-23 DIAGNOSIS — E1142 Type 2 diabetes mellitus with diabetic polyneuropathy: Secondary | ICD-10-CM | POA: Diagnosis not present

## 2021-09-23 DIAGNOSIS — I10 Essential (primary) hypertension: Secondary | ICD-10-CM | POA: Diagnosis not present

## 2021-09-23 DIAGNOSIS — D500 Iron deficiency anemia secondary to blood loss (chronic): Secondary | ICD-10-CM | POA: Diagnosis not present

## 2021-09-25 ENCOUNTER — Other Ambulatory Visit: Payer: HMO

## 2021-09-30 ENCOUNTER — Telehealth: Payer: Self-pay

## 2021-09-30 NOTE — Telephone Encounter (Signed)
Patient calls nurse line requesting to speak with PCP in regards to pain management.   Patient reports she had a difficult weekend with uncontrolled pain. Patient reports she is taking (1) tab every 6 hours, however her pain level remained 10/10. Patient would like to explore using CBD oil and/or gummies, however would like to discuss with PCP first.   Patient offered an apt, however requests PCP calls her.   Please advise.

## 2021-09-30 NOTE — Telephone Encounter (Signed)
Called patient.  Severe pain continues as it has for several months.  Already seeing pain management.  I am unwilling to increase narcotics further and have exhausted other items.  She asked if CBD gummies were OK with her current meds.  I told her there was not a good scientific answer to her question.  Anecdotally, I said that CBD seemed generally safe.

## 2021-10-04 NOTE — Telephone Encounter (Signed)
Patient calls nurse line requesting to speak with PCP again.   Patient reports I forgot to ask him if CBD was safe to take with oxycodone. Patient reports she is unsure if she specifically clarified this with him earlier in the week. Patient reports, "I do not want to stop the oxycodone all together, I just want to add to it."   Will forward to PCP.

## 2021-10-08 NOTE — Telephone Encounter (Signed)
Called and discussed.  Reiterated that we only have anecdotal evidence about CBC oil.  Should be safe.  Advised first few uses she should be home.  Watch for sedation and lightheadedness.

## 2021-10-14 DIAGNOSIS — M5124 Other intervertebral disc displacement, thoracic region: Secondary | ICD-10-CM | POA: Diagnosis not present

## 2021-10-14 DIAGNOSIS — M5416 Radiculopathy, lumbar region: Secondary | ICD-10-CM | POA: Diagnosis not present

## 2021-10-14 DIAGNOSIS — M5414 Radiculopathy, thoracic region: Secondary | ICD-10-CM | POA: Diagnosis not present

## 2021-10-15 ENCOUNTER — Telehealth: Payer: Self-pay

## 2021-10-15 NOTE — Telephone Encounter (Signed)
Informed pt her novo nordisk medication shipment is ready for pickup. Pt will come when she gets a ride. ? ?4 boxes of ozempic are ready & labeled in med room fridge. ?

## 2021-10-28 ENCOUNTER — Other Ambulatory Visit: Payer: Self-pay

## 2021-10-28 DIAGNOSIS — G8929 Other chronic pain: Secondary | ICD-10-CM

## 2021-10-28 MED ORDER — OXYCODONE-ACETAMINOPHEN 2.5-325 MG PO TABS: 1.0000 | ORAL_TABLET | Freq: Four times a day (QID) | ORAL | 0 refills | Status: DC | PRN

## 2021-10-29 DIAGNOSIS — F331 Major depressive disorder, recurrent, moderate: Secondary | ICD-10-CM | POA: Diagnosis not present

## 2021-10-29 DIAGNOSIS — Z8719 Personal history of other diseases of the digestive system: Secondary | ICD-10-CM | POA: Diagnosis not present

## 2021-11-08 NOTE — Telephone Encounter (Signed)
Medication given to patient

## 2021-11-28 DIAGNOSIS — L720 Epidermal cyst: Secondary | ICD-10-CM | POA: Diagnosis not present

## 2021-11-28 DIAGNOSIS — L570 Actinic keratosis: Secondary | ICD-10-CM | POA: Diagnosis not present

## 2021-12-04 ENCOUNTER — Other Ambulatory Visit: Payer: Self-pay

## 2021-12-04 ENCOUNTER — Telehealth: Payer: Self-pay

## 2021-12-04 DIAGNOSIS — G8929 Other chronic pain: Secondary | ICD-10-CM

## 2021-12-04 MED ORDER — OXYCODONE-ACETAMINOPHEN 2.5-325 MG PO TABS
1.0000 | ORAL_TABLET | Freq: Four times a day (QID) | ORAL | 0 refills | Status: DC | PRN
Start: 2021-12-04 — End: 2022-01-07

## 2021-12-04 NOTE — Telephone Encounter (Signed)
Noted and agree. 

## 2021-12-04 NOTE — Telephone Encounter (Signed)
Patient calls nurse line reporting abnormal urine odor and fatigue.  ? ?Patient reports symptoms started yesterday. Patient reports her urine smells like "fish" and she reports being more tired than usual. ? ?Patient denies fevers, chills, abdominal pain, dysuria or urinary frequency.  ? ?Patient advised on evaluation, however only would like to see PCP.  ? ?Patient agreed to see Pray on Friday.  ? ?Red flags discussed. ?

## 2021-12-05 NOTE — Progress Notes (Signed)
?  ? ? ?  SUBJECTIVE:  ? ?CHIEF COMPLAINT / HPI:  ? ?Urine odor- notes for the past month she has had vaginal itching, noticed cloudy urine. Denies fevers, chills, upper back pain, abdominal pain. Feels like yeast infection previously. Has urinary incontinence and wears diapers so unsure of urinary frequency. Not sexually active. No recent antibiotic use. ? ?Also notes feeling tired. Sleeps 6-7 hours per night and wakes up well rested but after an hour awake feels exhausted again. History of OSA but her CPAP gave her migraines so she doesn't use it. She is wondering if her Hgb is okay. She feels like she has been eating the same but gaining weight. No fevers or chills. ? ?PERTINENT  PMH / PSH: HTN, migraine, T2DM, OSA, hyperlipidemia, urinary incontinence, chronic pain syndrome ? ?OBJECTIVE:  ? ?BP (!) 142/75   Pulse (!) 103   Ht '4\' 11"'$  (1.499 m)   Wt 172 lb 3.2 oz (78.1 kg)   SpO2 94%   BMI 34.78 kg/m?   ?General: A&O, NAD ?HEENT: No sign of trauma, EOM grossly intact ?Respiratory: normal WOB ?GI: Soft, NTTP, non-distended, no CVA tenderness bilaterally ?GU: Erythematous appearance of labia majora and minora, without lesions. Vagina tissue pink, moist, without lesions or abrasions. Cervix normal appearance, non-friable, without discharge from os.  ?Extremities: NTTP, no peripheral edema. ?Neuro: Normal gait, moves all four extremities appropriately. ?Psych: Appropriate mood and affect ? ?April Tacy Dura CMA present as chaperone for GU exam. ? ?ASSESSMENT/PLAN:  ? ?Other fatigue ?- POC Hgb 11.0 today, will check TSH and CMP and recommend close follow up with  PCP ?- discussed untreated OSA may be contributing to fatigue ? ?Abnormal urine odor ?- urinalysis with trace leuks, but her symptoms more consistent with vaginitis ?- wet prep negative, will treat with diflucan x1 as this is similar to her other yeast infections, no STI testing as she is not sexually active ?- will send urine for culture, will call if  positive ?- discussed strict return precautions ?  ? ? ?Lenoria Chime, MD ?[place]  ? ?

## 2021-12-06 ENCOUNTER — Ambulatory Visit (INDEPENDENT_AMBULATORY_CARE_PROVIDER_SITE_OTHER): Payer: HMO | Admitting: Family Medicine

## 2021-12-06 ENCOUNTER — Encounter: Payer: Self-pay | Admitting: Family Medicine

## 2021-12-06 VITALS — BP 142/75 | HR 103 | Ht 59.0 in | Wt 172.2 lb

## 2021-12-06 DIAGNOSIS — R5383 Other fatigue: Secondary | ICD-10-CM | POA: Diagnosis not present

## 2021-12-06 DIAGNOSIS — D500 Iron deficiency anemia secondary to blood loss (chronic): Secondary | ICD-10-CM | POA: Diagnosis not present

## 2021-12-06 DIAGNOSIS — R8290 Unspecified abnormal findings in urine: Secondary | ICD-10-CM | POA: Diagnosis not present

## 2021-12-06 LAB — POCT WET PREP (WET MOUNT)
Clue Cells Wet Prep Whiff POC: NEGATIVE
Trichomonas Wet Prep HPF POC: ABSENT

## 2021-12-06 LAB — POCT URINALYSIS DIP (MANUAL ENTRY)
Bilirubin, UA: NEGATIVE
Glucose, UA: NEGATIVE mg/dL
Ketones, POC UA: NEGATIVE mg/dL
Nitrite, UA: NEGATIVE
Protein Ur, POC: NEGATIVE mg/dL
Spec Grav, UA: 1.02 (ref 1.010–1.025)
Urobilinogen, UA: 0.2 E.U./dL
pH, UA: 6 (ref 5.0–8.0)

## 2021-12-06 LAB — POCT UA - MICROSCOPIC ONLY: WBC, Ur, HPF, POC: >20 (ref 0–5)

## 2021-12-06 LAB — POCT HEMOGLOBIN: Hemoglobin: 11 g/dL (ref 11–14.6)

## 2021-12-06 MED ORDER — FLUCONAZOLE 150 MG PO TABS: 150.0000 mg | ORAL_TABLET | Freq: Once | ORAL | 0 refills | Status: AC

## 2021-12-06 NOTE — Patient Instructions (Addendum)
It was wonderful to see you today. ? ?Please bring ALL of your medications with you to every visit.  ? ?Today we talked about: ? ?- Your wet prep shows no bacterial vaginosis or yeast, however as this feels similar to your previous yeast infections we will try a one time dose of diflucan, and I will culture your urine ?- We will get lab work today to assess for fatigue and I recommend close follow up with your PCP in next 1-2 weeks ? ? ?Thank you for choosing [facility].  ? ?Please call [phone] with any questions about today's appointment. ? ?Please be sure to schedule follow up at the front  desk before you leave today.  ? ?Please arrive at least 15 minutes prior to your scheduled appointments. ?  ?If you had blood work today, I will send you a MyChart message or a letter if results are normal. Otherwise, I will give you a call. ?  ?If you had a referral placed, they will call you to set up an appointment. Please give [place] a call if you don't hear back in the next 2 weeks. ?  ?If you need additional refills before your next appointment, please call your pharmacy first.  ? ?Yehuda Savannah, MD  ?Family Medicine  ?

## 2021-12-06 NOTE — Assessment & Plan Note (Signed)
-   POC Hgb 11.0 today, will check TSH and CMP and recommend close follow up with  PCP ?- discussed untreated OSA may be contributing to fatigue ?

## 2021-12-06 NOTE — Assessment & Plan Note (Addendum)
-   urinalysis with trace leuks, but her symptoms more consistent with vaginitis ?- wet prep negative, will treat with diflucan x1 as this is similar to her other yeast infections, no STI testing as she is not sexually active ?- will send urine for culture, will call if positive ?- discussed strict return precautions ?

## 2021-12-07 LAB — COMPREHENSIVE METABOLIC PANEL
ALT: 24 IU/L (ref 0–32)
AST: 36 IU/L (ref 0–40)
Albumin/Globulin Ratio: 2.3 — ABNORMAL HIGH (ref 1.2–2.2)
Albumin: 4.5 g/dL (ref 3.7–4.7)
Alkaline Phosphatase: 109 IU/L (ref 44–121)
BUN/Creatinine Ratio: 21 (ref 12–28)
BUN: 20 mg/dL (ref 8–27)
Bilirubin Total: 0.4 mg/dL (ref 0.0–1.2)
CO2: 23 mmol/L (ref 20–29)
Calcium: 9.6 mg/dL (ref 8.7–10.3)
Chloride: 101 mmol/L (ref 96–106)
Creatinine, Ser: 0.94 mg/dL (ref 0.57–1.00)
Globulin, Total: 2 g/dL (ref 1.5–4.5)
Glucose: 79 mg/dL (ref 70–99)
Potassium: 4.4 mmol/L (ref 3.5–5.2)
Sodium: 142 mmol/L (ref 134–144)
Total Protein: 6.5 g/dL (ref 6.0–8.5)
eGFR: 62 mL/min/{1.73_m2} (ref >59)

## 2021-12-07 LAB — TSH RFX ON ABNORMAL TO FREE T4: TSH: 2.59 u[IU]/mL (ref 0.450–4.500)

## 2021-12-09 ENCOUNTER — Telehealth: Payer: Self-pay | Admitting: *Deleted

## 2021-12-09 ENCOUNTER — Other Ambulatory Visit: Payer: Self-pay | Admitting: Family Medicine

## 2021-12-09 DIAGNOSIS — F419 Anxiety disorder, unspecified: Secondary | ICD-10-CM

## 2021-12-09 MED ORDER — BUSPIRONE HCL 5 MG PO TABS: 5.0000 mg | ORAL_TABLET | Freq: Two times a day (BID) | ORAL | 3 refills | Status: DC | PRN

## 2021-12-09 MED ORDER — CEPHALEXIN 500 MG PO CAPS: 500.0000 mg | ORAL_CAPSULE | Freq: Four times a day (QID) | ORAL | 0 refills | Status: AC

## 2021-12-09 NOTE — Telephone Encounter (Signed)
Request received for refil on pts Buspirone, according to our system she should have refills.  Contacted pharmacy to clarify and they said that on their end it looks like the Rx was cancelled.  I told him that I would have doctor resend if appropriate. Kitiara Hintze Zimmerman Rumple, CMA ? ?

## 2021-12-09 NOTE — Telephone Encounter (Signed)
Rx re-sent as requested.

## 2021-12-11 LAB — URINE CULTURE

## 2021-12-12 ENCOUNTER — Encounter: Payer: Self-pay | Admitting: Family Medicine

## 2021-12-12 ENCOUNTER — Ambulatory Visit (INDEPENDENT_AMBULATORY_CARE_PROVIDER_SITE_OTHER): Payer: HMO | Admitting: Family Medicine

## 2021-12-12 VITALS — BP 132/76 | HR 91 | Wt 171.6 lb

## 2021-12-12 DIAGNOSIS — E119 Type 2 diabetes mellitus without complications: Secondary | ICD-10-CM | POA: Diagnosis not present

## 2021-12-12 DIAGNOSIS — R5383 Other fatigue: Secondary | ICD-10-CM

## 2021-12-12 DIAGNOSIS — R8290 Unspecified abnormal findings in urine: Secondary | ICD-10-CM | POA: Diagnosis not present

## 2021-12-12 DIAGNOSIS — N3001 Acute cystitis with hematuria: Secondary | ICD-10-CM | POA: Diagnosis not present

## 2021-12-12 DIAGNOSIS — M159 Polyosteoarthritis, unspecified: Secondary | ICD-10-CM | POA: Diagnosis not present

## 2021-12-12 LAB — POCT GLYCOSYLATED HEMOGLOBIN (HGB A1C): HbA1c, POC (controlled diabetic range): 6.6 % (ref 0.0–7.0)

## 2021-12-12 MED ORDER — METRONIDAZOLE 500 MG PO TABS
500.0000 mg | ORAL_TABLET | Freq: Two times a day (BID) | ORAL | 0 refills | Status: AC
Start: 2021-12-12 — End: 2021-12-19

## 2021-12-12 NOTE — Assessment & Plan Note (Signed)
I am reluctant to add to her allergy list.  Continue keflex (only at three times daily.) ?

## 2021-12-12 NOTE — Progress Notes (Signed)
? ? ?  SUBJECTIVE:  ? ?CHIEF COMPLAINT / HPI:  ? ?Multiple issues: ?E coli UTI on day 2 of Rx with Keflex.  Patient states roof of mouth peeling and wonders if this is an allergy.  No tongue or throat swelling.  Already has a lengthy allergy list. ?Urine still smells terrible.  "The whole house smells like fish. ?Tired all the time.  Falls asleep during the day easily.  Previous hx of sleep apnea.  Lives alone so does not know about snoring or stopping breathing.  Not wearing CPAP.  Has lost considerable weight since original dx.  Thinks depression is improving.  Does have the one year anniversary of her husband's death one week away.  Labs last week all reassuring. ?DM.  A1c today good.   ?Left knee locking and giving out.  Asks about cortisone shot.   ? ? ? ?OBJECTIVE:  ? ?BP 132/76   Pulse 91   Wt 171 lb 9.6 oz (77.8 kg)   SpO2 95%   BMI 34.66 kg/m?   ?Mouth, grossly normal despite her complaint of peeling roof of mouth. ?Lungs clear ?Cardiac RRR without m or g ? ?ASSESSMENT/PLAN:  ? ?Abnormal urine odor ?Empiric rx of BV based on classic fishy smell despite the fact previous wet mount not suggestive of BV. ? ?Other fatigue ?Observe for now.  May need a repeat sleep study.  We discussed the possibility of an anniversary reaction - a concept she had not heard of previously. ? ?UTI (urinary tract infection) ?I am reluctant to add to her allergy list.  Continue keflex (only at three times daily.) ? ?Non-insulin dependent type 2 diabetes mellitus ([place]) ?Good control  No change. ? ?Osteoarthritis, multiple sites ?She will see ortho about her knee locking up on her.   ?  ? ? ?Zenia Resides, MD ?[place]  ?

## 2021-12-12 NOTE — Assessment & Plan Note (Signed)
Good control.  No change. 

## 2021-12-12 NOTE — Assessment & Plan Note (Signed)
Empiric rx of BV based on classic fishy smell despite the fact previous wet mount not suggestive of BV. ?

## 2021-12-12 NOTE — Patient Instructions (Addendum)
Only take the antibiotic three times a day for three days.  Make a note to yourself.  I am not labeling you as allergic but I would want to know if we used it again and your mouth got irritated.  ?I sent in a second antibiotic to treat the fishy smell.  I hope it works.  Let me know.   ?All your blood work is reassuring that there is no important underlying problem. ?I honestly don't know what is causing the sleepiness. ?Lets plan on weaning you off the oxy some time in the next six months.   ?Call Dr. Ninfa Linden for an appointment about your knee and shoulder.   ?

## 2021-12-12 NOTE — Assessment & Plan Note (Signed)
Observe for now.  May need a repeat sleep study.  We discussed the possibility of an anniversary reaction - a concept she had not heard of previously. ?

## 2021-12-12 NOTE — Assessment & Plan Note (Signed)
She will see ortho about her knee locking up on her.   ?

## 2021-12-17 ENCOUNTER — Other Ambulatory Visit: Payer: Self-pay | Admitting: Family Medicine

## 2021-12-17 DIAGNOSIS — G9059 Complex regional pain syndrome I of other specified site: Secondary | ICD-10-CM

## 2021-12-30 ENCOUNTER — Ambulatory Visit (INDEPENDENT_AMBULATORY_CARE_PROVIDER_SITE_OTHER): Payer: HMO

## 2021-12-30 ENCOUNTER — Ambulatory Visit: Payer: HMO | Admitting: Physician Assistant

## 2021-12-30 ENCOUNTER — Encounter: Payer: Self-pay | Admitting: Physician Assistant

## 2021-12-30 ENCOUNTER — Other Ambulatory Visit: Payer: Self-pay

## 2021-12-30 DIAGNOSIS — M545 Low back pain, unspecified: Secondary | ICD-10-CM | POA: Diagnosis not present

## 2021-12-30 DIAGNOSIS — M25562 Pain in left knee: Secondary | ICD-10-CM

## 2021-12-30 DIAGNOSIS — E119 Type 2 diabetes mellitus without complications: Secondary | ICD-10-CM

## 2021-12-30 DIAGNOSIS — G8929 Other chronic pain: Secondary | ICD-10-CM

## 2021-12-30 MED ORDER — ONETOUCH DELICA PLUS LANCET33G MISC: 3 refills | Status: DC

## 2021-12-30 MED ORDER — ONETOUCH VERIO VI STRP: ORAL_STRIP | 3 refills | Status: DC

## 2021-12-30 MED ORDER — LIDOCAINE HCL 1 % IJ SOLN
3.0000 mL | INTRAMUSCULAR | Status: AC | PRN
  Administered 2021-12-30: 3 mL

## 2021-12-30 MED ORDER — METHYLPREDNISOLONE ACETATE 40 MG/ML IJ SUSP
40.0000 mg | INTRAMUSCULAR | Status: AC | PRN
  Administered 2021-12-30: 40 mg via INTRA_ARTICULAR

## 2021-12-30 NOTE — Progress Notes (Addendum)
Office Visit Note   Patient: [name]           Date of Birth: May 06, 1941           MRN: [id number] Visit Date: 12/30/2021              Requested by: Zenia Resides, MD [address] [place],  [place] [address] PCP: Zenia Resides, MD   Assessment & Plan: Visit Diagnoses:  1. Low back pain, unspecified back pain laterality, unspecified chronicity, unspecified whether sciatica present   2. Chronic pain of left knee     Plan: We will have her follow-up with Dr. Ellene Route for her  back.  In regards to the knee like to see her back in 2 weeks see what type of response she had to the aspiration injection.  Questions were encouraged and answered at length.  Follow-Up Instructions: No follow-ups on file.   Orders:  Orders Placed This Encounter  Procedures   Large Joint Inj: L knee   XR Knee 1-2 Views Left   XR Lumbar Spine 2-3 Views   Meds ordered this encounter  Medications   lidocaine (XYLOCAINE) 1 % (with pres) injection 3 mL   methylPREDNISolone acetate (DEPO-MEDROL) injection 40 mg      Procedures: Large Joint Inj: L knee on 12/30/2021 4:36 PM Indications: pain Details: 22 G 1.5 in needle, superolateral approach  Arthrogram: No  Medications: 3 mL lidocaine 1 %; 40 mg methylPREDNISolone acetate 40 MG/ML Aspirate: 15 mL yellow Outcome: tolerated well, no immediate complications Procedure, treatment alternatives, risks and benefits explained, specific risks discussed. Consent was given by the patient. Immediately prior to procedure a time out was called to verify the correct patient, procedure, equipment, support staff and site/side marked as required. Patient was prepped and draped in the usual sterile fashion.      Clinical Data: No additional findings.   Subjective: Chief Complaint  Patient presents with   Lower Back - Pain   Left Knee - Pain    HPI [name] 81 year old female comes in today with left knee pain and low back pain.  She  states that her left knee is locking and giving way.  She notes no swelling in the knee.  No injury to the knee.  She has had prior knee pain in the past and had a cortisone injection with her primary care doctor a year ago and did well until couple months ago when her knee started bothering her again.  She also having low back pain.  She does see Dr. Ellene Route.  She notes that she is having waking pain due to the back denies any fevers chills.  She has urinary incontinence at baseline but is became worse recently.  No saddle anesthesia like symptoms.  She has difficulty walking due to low back pain denies any numbness tingling down either leg. Review of Systems See HPI  Objective: Vital Signs: There were no vitals taken for this visit.  Physical Exam Constitutional:      Appearance: She is not ill-appearing or diaphoretic.  Pulmonary:     Effort: Pulmonary effort is normal.  Neurological:     Mental Status: She is alert and oriented to person, place, and time.  Psychiatric:        Mood and Affect: Mood normal.    Ortho Exam Bilateral knees good range of motion of both knees.  Left knee with patellofemoral crepitus.  No instability valgus varus stressing.  McMurray's is negative.  Left knee slight effusion no abnormal warmth or erythema.  Right knee no effusion abnormal warmth or erythema.  Tenderness lateral joint line of the left knee medial joint line of the right knee.  Lower extremities 5 out of 5 strength throughout against resistance. Specialty Comments:  No specialty comments available.  Imaging: No results found.   PMFS History: Patient Active Problem List   Diagnosis Date Noted   Other fatigue 12/06/2021   Abnormal urine odor 12/06/2021   Complex regional pain syndrome I of other specified site    Chronic pain syndrome    Polypharmacy 12/31/2020   Asthma exacerbation 11/28/2020   Itching of ear, bilateral 05/11/2020   Financial difficulties 11/16/2019   Osteoarthritis of  knee, unspecified 06/13/2019   Rotator cuff syndrome of right shoulder 01/21/2019   Bladder prolapse, female, acquired 08/16/2018   Urinary incontinence 07/05/2018   Frequent falls 12/24/2017   Adjustment reaction with anxiety and depression 31/51/7616   Ruptured silicone breast implant 12/08/2016   Iron deficiency anemia due to chronic blood loss 10/31/2015   Chronic right-sided thoracic back pain 07/26/2015   Spinal stenosis of lumbar region 07/10/2015   Abdominal aortic atherosclerosis ([place]) 07/10/2015   UTI (urinary tract infection) 09/07/2013   Sleep apnea 05/22/2010   Hereditary and idiopathic peripheral neuropathy 02/06/2010   PULMONARY NODULE, SOLITARY 07/20/2009   Irritable bowel syndrome 11/12/2007   Non-insulin dependent type 2 diabetes mellitus ([place]) 10/08/2006   HYPERCHOLESTEROLEMIA 10/08/2006   HYPERTRIGLYCERIDEMIA 10/08/2006   Migraine headache 10/08/2006   CARPAL TUNNEL SYNDROME 10/08/2006   HYPERTENSION, BENIGN SYSTEMIC 10/08/2006   ASTHMA, PERSISTENT, MODERATE 10/08/2006   Reflux esophagitis 10/08/2006   Peptic ulcer 10/08/2006   Eczema 10/08/2006   Psoriasis 10/08/2006   Osteoarthritis, multiple sites 10/08/2006   VERTIGO NOS OR DIZZINESS 10/08/2006   Past Medical History:  Diagnosis Date   Asthma    Depression    Diabetes mellitus without complication ([place])    History of surgery on arm    right arm ( plate and screws)   Hypertension    Macular degeneration    Migraine    without aura   Pneumonia    Urinary incontinence     Family History  Problem Relation Age of Onset   Hypertension Mother    Hypertension Father    Hearing loss Sister    Heart attack Sister 16       s/p 4 bypasses    Stroke Brother        84   Breast cancer Maternal Aunt    Ovarian cancer Maternal Aunt     Past Surgical History:  Procedure Laterality Date   ABDOMINAL HYSTERECTOMY     ?Lt. ovary remains   APPENDECTOMY     Arm surgery     BREAST BIOPSY Right 10/10/2009    BREAST CYST ASPIRATION  12/20/2013   BREAST EXCISIONAL BIOPSY Left 1998   BREAST EXCISIONAL BIOPSY Right 1993   ESOPHAGOGASTRODUODENOSCOPY N/A 04/17/2021   Procedure: ESOPHAGOGASTRODUODENOSCOPY (EGD);  Surgeon: Juanita Craver, MD;  [facility] END[facility]COPY;  Service: Endoscopy;  Laterality: N/A;  Rectal bleeding and anemia.   ESOPHAGOGASTRODUODENOSCOPY N/A 04/18/2021   Procedure: ESOPHAGOGASTRODUODENOSCOPY (EGD);  Surgeon: Carol Ada, MD;  Location: [facility];  Service: Endoscopy;  Laterality: N/A;   HOT HEMOSTASIS N/A 04/18/2021   Procedure: HOT HEMOSTASIS (ARGON PLASMA COAGULATION/BICAP);  Surgeon: Carol Ada, MD;  Location: [facility];  Service: Endoscopy;  Laterality: N/A;   IR ANGIOGRAM SELECTIVE EACH ADDITIONAL VESSEL  04/17/2021   IR  ANGIOGRAM VISCERAL SELECTIVE  04/17/2021   IR EMBO ART  VEN HEMORR LYMPH EXTRAV  INC GUIDE ROADMAPPING  04/17/2021   IR US GUIDE VASC ACCESS RIGHT  04/17/2021   RADIOLOGY WITH ANESTHESIA N/A 04/17/2021   Procedure: RADIOLOGY WITH ANESTHESIA;  Surgeon: Radiologist, Medication, MD;  Location: [facility];  Service: Radiology;  Laterality: N/A;   SUBMUCOSAL INJECTION  04/18/2021   Procedure: SUBMUCOSAL INJECTION;  Surgeon: Carol Ada, MD;  Location: [facility] ENDOSCOPY;  Service: Endoscopy;;   Social History   Occupational History   Not on file  Tobacco Use   Smoking status: Former    Packs/day: 2.00    Years: 33.00    Pack years: 66.00    Types: Cigarettes    Start date: 08/11/1957    Quit date: 04/04/1991    Years since quitting: 30.8   Smokeless tobacco: Never  Vaping Use   Vaping Use: Never used  Substance and Sexual Activity   Alcohol use: No   Drug use: No   Sexual activity: Not on file

## 2022-01-07 ENCOUNTER — Other Ambulatory Visit: Payer: Self-pay

## 2022-01-07 DIAGNOSIS — M546 Pain in thoracic spine: Secondary | ICD-10-CM

## 2022-01-07 MED ORDER — OXYCODONE-ACETAMINOPHEN 2.5-325 MG PO TABS: 1.0000 | ORAL_TABLET | Freq: Four times a day (QID) | ORAL | 0 refills | Status: DC | PRN

## 2022-01-08 ENCOUNTER — Ambulatory Visit: Payer: HMO | Admitting: Orthopaedic Surgery

## 2022-01-13 ENCOUNTER — Encounter: Payer: Self-pay | Admitting: Physician Assistant

## 2022-01-13 ENCOUNTER — Ambulatory Visit: Payer: HMO | Admitting: Physician Assistant

## 2022-01-13 DIAGNOSIS — M1712 Unilateral primary osteoarthritis, left knee: Secondary | ICD-10-CM | POA: Diagnosis not present

## 2022-01-13 DIAGNOSIS — M7061 Trochanteric bursitis, right hip: Secondary | ICD-10-CM

## 2022-01-13 DIAGNOSIS — M7062 Trochanteric bursitis, left hip: Secondary | ICD-10-CM

## 2022-01-13 DIAGNOSIS — M545 Low back pain, unspecified: Secondary | ICD-10-CM

## 2022-01-13 NOTE — Progress Notes (Signed)
HPI: Mrs. Brose returns today for her knee pain.  She states that she only got minimal relief less than 20% from her knee pain with the injection.  States knee still gives out.  She is having pain from her left buttocks down into her ankle.  Denies any numbness tingling.  She has had low back pain.  She is still waiting to hear from Dr. Clarice Pole office.  She is on Cymbalta and Tylenol.  She is unable to take NSAIDs.  Physical exam: General well-developed well-nourished female no acute distress. Lower extremities good range of motion both hips without pain.  Trochanteric tenderness bilaterally.  Negative straight leg raise bilaterally.  Good range of motion of both knees.  No abnormal warmth erythema or effusion.  Left knee tenderness lateral joint line.  Impression: Bilateral trochanteric bursitis Low back pain with radicular symptoms Left knee osteoarthritis  Plan: Given her failure of conservative treatment for the left knee arthritis recommend supplemental injections.  We will have her back once these are available.  She will continue to work on IT band stretching.  She will follow-up with Dr. Loanne Drilling of her low back.

## 2022-01-14 ENCOUNTER — Encounter: Payer: Self-pay | Admitting: *Deleted

## 2022-01-14 ENCOUNTER — Telehealth: Payer: Self-pay

## 2022-01-14 NOTE — Telephone Encounter (Signed)
Informed pt 4 boxes of ozempic are ready for pickup.  Medication labeled and ready in med room fridge.

## 2022-01-14 NOTE — Telephone Encounter (Signed)
Please get auth for left knee gel injection-gil pt 

## 2022-01-17 NOTE — Telephone Encounter (Signed)
Noted  

## 2022-01-28 ENCOUNTER — Telehealth: Payer: Self-pay

## 2022-01-28 NOTE — Telephone Encounter (Signed)
VOB submitted for Monovisc, left knee BV pending

## 2022-01-31 ENCOUNTER — Other Ambulatory Visit: Payer: Self-pay | Admitting: Family Medicine

## 2022-01-31 DIAGNOSIS — E7800 Pure hypercholesterolemia, unspecified: Secondary | ICD-10-CM

## 2022-02-13 ENCOUNTER — Other Ambulatory Visit: Payer: Self-pay

## 2022-02-13 DIAGNOSIS — G8929 Other chronic pain: Secondary | ICD-10-CM

## 2022-02-13 MED ORDER — OXYCODONE-ACETAMINOPHEN 2.5-325 MG PO TABS: 1.0000 | ORAL_TABLET | Freq: Four times a day (QID) | ORAL | 0 refills | Status: DC | PRN

## 2022-02-18 ENCOUNTER — Encounter: Payer: Self-pay | Admitting: Orthopaedic Surgery

## 2022-02-18 ENCOUNTER — Ambulatory Visit: Payer: HMO | Admitting: Orthopaedic Surgery

## 2022-02-18 DIAGNOSIS — M1712 Unilateral primary osteoarthritis, left knee: Secondary | ICD-10-CM

## 2022-02-18 MED ORDER — HYALURONAN 88 MG/4ML IX SOSY
88.0000 mg | PREFILLED_SYRINGE | INTRA_ARTICULAR | Status: AC | PRN
  Administered 2022-02-18: 88 mg via INTRA_ARTICULAR

## 2022-02-18 NOTE — Progress Notes (Signed)
   Procedure Note  Patient: [name]             Date of Birth: 07-15-1941           MRN: [id number]             Visit Date: 02/18/2022  Procedures: Visit Diagnoses:  1. Primary osteoarthritis of left knee     Large Joint Inj: L knee on 02/18/2022 9:57 AM Indications: diagnostic evaluation and pain Details: 22 G 1.5 in needle, superolateral approach  Arthrogram: No  Medications: 88 mg Hyaluronan 88 MG/4ML Outcome: tolerated well, no immediate complications Procedure, treatment alternatives, risks and benefits explained, specific risks discussed. Consent was given by the patient. Immediately prior to procedure a time out was called to verify the correct patient, procedure, equipment, support staff and site/side marked as required. Patient was prepped and draped in the usual sterile fashion.    The patient is a 81 year old female who comes in today for scheduled hyaluronic acid injection in her left knee to treat the pain from osteoarthritis.  She has failed conservative treatment and even failed steroid injection in that knee which she states did not help her at all.  She does have significant arthritis in the left knee.  The examination knee shows no effusion today.  She has a valgus malalignment.  She does report that she started experiencing some right knee pain.  She also has some right shoulder pain she states.  I did place Monovisc in the right knee without difficulty.  She knows that she has to wait at least 6 months between those types of injections at a minimum.  The only other treatment option will be knee replacement.  If her right knee continues to bother her she will come see [place] as well as if she develops worsening shoulder problems.  All questions and concerns were answered and addressed.

## 2022-02-18 NOTE — Telephone Encounter (Signed)
Gave pickup f/u call.  Pt aware it's still here & still wants medication.

## 2022-02-20 ENCOUNTER — Other Ambulatory Visit: Payer: Self-pay

## 2022-02-20 DIAGNOSIS — M1712 Unilateral primary osteoarthritis, left knee: Secondary | ICD-10-CM

## 2022-02-21 ENCOUNTER — Telehealth: Payer: Self-pay

## 2022-02-21 NOTE — Telephone Encounter (Signed)
Called and advised pt that this is normal. Ice and and keep moving knee. Can take 4-6  weeks to see results from gel injection.And also informed her that is was ok to have the back injection, she stated understanding

## 2022-02-21 NOTE — Telephone Encounter (Signed)
Patient called stating that she is having a lot of pain and left knee is warm to the touch.  Would like to know if this is normal after receiving gel injection and would like to know if back injection that she is scheduled to have on Monday, 02/24/2022, would interfere with the gel injection?  Cb# [phone].  Please advise.  Thank you.

## 2022-02-24 DIAGNOSIS — M5116 Intervertebral disc disorders with radiculopathy, lumbar region: Secondary | ICD-10-CM | POA: Diagnosis not present

## 2022-02-24 DIAGNOSIS — M5416 Radiculopathy, lumbar region: Secondary | ICD-10-CM | POA: Diagnosis not present

## 2022-02-26 DIAGNOSIS — L821 Other seborrheic keratosis: Secondary | ICD-10-CM | POA: Diagnosis not present

## 2022-02-26 DIAGNOSIS — L218 Other seborrheic dermatitis: Secondary | ICD-10-CM | POA: Diagnosis not present

## 2022-02-26 DIAGNOSIS — D225 Melanocytic nevi of trunk: Secondary | ICD-10-CM | POA: Diagnosis not present

## 2022-02-26 DIAGNOSIS — L298 Other pruritus: Secondary | ICD-10-CM | POA: Diagnosis not present

## 2022-02-26 DIAGNOSIS — L814 Other melanin hyperpigmentation: Secondary | ICD-10-CM | POA: Diagnosis not present

## 2022-02-26 DIAGNOSIS — L503 Dermatographic urticaria: Secondary | ICD-10-CM | POA: Diagnosis not present

## 2022-02-26 DIAGNOSIS — L570 Actinic keratosis: Secondary | ICD-10-CM | POA: Diagnosis not present

## 2022-02-26 DIAGNOSIS — Z09 Encounter for follow-up examination after completed treatment for conditions other than malignant neoplasm: Secondary | ICD-10-CM | POA: Diagnosis not present

## 2022-02-28 ENCOUNTER — Encounter: Payer: Self-pay | Admitting: Orthopedic Surgery

## 2022-02-28 ENCOUNTER — Ambulatory Visit (INDEPENDENT_AMBULATORY_CARE_PROVIDER_SITE_OTHER): Payer: PPO

## 2022-02-28 ENCOUNTER — Ambulatory Visit (INDEPENDENT_AMBULATORY_CARE_PROVIDER_SITE_OTHER): Payer: PPO | Admitting: Orthopedic Surgery

## 2022-02-28 DIAGNOSIS — M542 Cervicalgia: Secondary | ICD-10-CM

## 2022-02-28 DIAGNOSIS — M25511 Pain in right shoulder: Secondary | ICD-10-CM

## 2022-02-28 DIAGNOSIS — G8929 Other chronic pain: Secondary | ICD-10-CM | POA: Diagnosis not present

## 2022-02-28 NOTE — Progress Notes (Signed)
Office Visit Note   Patient: [name]           Date of Birth: Mar 09, 1941           MRN: [id number] Visit Date: 02/28/2022 Requested by: Zenia Resides, MD [place],  [place] [address] PCP: Zenia Resides, MD  Subjective: Chief Complaint  Patient presents with   Right Shoulder - Pain    HPI: [name] is a 81 year old patient with right shoulder pain.  She had a fall 2 and half years ago.  She is left-hand dominant.  Reports constant pain since the fall.  Pain is getting worse.  She also reports some headaches at times.  Denies any radicular arm pain or numbness and tingling.  Does report some occasional scapular pain on the right.  Pain does wake her from sleep at night.  1 year ago she had another injury where she developed bruising on the arm.  Does not take blood thinners.  I reviewed that photo.  Pain is a bigger problem than weakness.  Only takes ibuprofen for her shoulder pain but she has been on oxycodone for her back for about a year.  She does live alone.  States she is at about a 50% functional level.  Cannot really work.  Left arm is not a problem.             ROS: All systems reviewed are negative as they relate to the chief complaint within the history of present illness.  Patient denies  fevers or chills.   Assessment & Plan: Visit Diagnoses:  1. Chronic right shoulder pain   2. Neck pain     Plan: Impression is right shoulder pain with rotator cuff arthropathy and arthritis and subscapularis deficiency.  Pain is a bigger problem than function.  She does have moderately reduced function but can still actively forward flex her arm to about 90 degrees.  Blood glucoses have been high recently.  She would like to have an injection in her shoulder which we can perform in 2 weeks once her blood glucose has gotten back to normal.  MRI scans is reviewed and it shows complete rupture of the subscapularis tendon with moderate retraction and muscular atrophy  with some interstitial tearing of the infraspinatus and supraspinatus with long head of the biceps tendon rupture.  Calcified loose body in the bicipital groove.  Plain radiographs do show some arthritis anteriorly at the glenoid.  Follow-Up Instructions: Return in about 2 weeks (around 03/14/2022).   Orders:  Orders Placed This Encounter  Procedures   XR Shoulder Right   XR Cervical Spine 2 or 3 views   No orders of the defined types were placed in this encounter.     Procedures: No procedures performed   Clinical Data: No additional findings.  Objective: Vital Signs: There were no vitals taken for this visit.  Physical Exam:   Constitutional: Patient appears well-developed HEENT:  Head: Normocephalic Eyes:EOM are normal Neck: Normal range of motion Cardiovascular: Normal rate Pulmonary/chest: Effort normal Neurologic: Patient is alert Skin: Skin is warm Psychiatric: Patient has normal mood and affect   Ortho Exam: Ortho exam demonstrates passive range of motion on the left of 85/110/170.  On the right it is 45/90/130.  She does have active forward flexion to 90 and active abduction to about 80 on the right-hand side.  Deltoid is functional.  She does have some weakness to subscap testing on the right compared to the left.  External rotation strength 5- out of 5 on the right compared to 5+ out of 5 on the left.  Does have some coarseness with internal/external rotation at 90 degrees of abduction  Specialty Comments:  No specialty comments available.  Imaging: XR Cervical Spine 2 or 3 views  Result Date: 02/28/2022 AP lateral cervical spine radiographs reviewed.  Grade 1 spondylolisthesis present at C4-5.  Degenerative disc disease present at C5-6 which is moderate to severe.  No acute fracture.  Facet arthritis present at C4-5 and C5-6.  XR Shoulder Right  Result Date: 02/28/2022 AP axillary and outlet radiograph views right shoulder reviewed.  There is mild anterior  subluxation of the humeral head on the glenoid.  Some bone-on-bone change is present at this region.  Acromiohumeral distance slightly diminished.  No acute fracture.  Small inferior humeral neck osteophyte present.  AC joint mild arthritis    PMFS History: Patient Active Problem List   Diagnosis Date Noted   Other fatigue 12/06/2021   Abnormal urine odor 12/06/2021   Complex regional pain syndrome I of other specified site    Chronic pain syndrome    Polypharmacy 12/31/2020   Asthma exacerbation 11/28/2020   Itching of ear, bilateral 05/11/2020   Financial difficulties 11/16/2019   Osteoarthritis of knee, unspecified 06/13/2019   Rotator cuff syndrome of right shoulder 01/21/2019   Bladder prolapse, female, acquired 08/16/2018   Urinary incontinence 07/05/2018   Frequent falls 12/24/2017   Adjustment reaction with anxiety and depression 03/47/4259   Ruptured silicone breast implant 12/08/2016   Iron deficiency anemia due to chronic blood loss 10/31/2015   Chronic right-sided thoracic back pain 07/26/2015   Spinal stenosis of lumbar region 07/10/2015   Abdominal aortic atherosclerosis ([place]) 07/10/2015   UTI (urinary tract infection) 09/07/2013   Sleep apnea 05/22/2010   Hereditary and idiopathic peripheral neuropathy 02/06/2010   PULMONARY NODULE, SOLITARY 07/20/2009   Irritable bowel syndrome 11/12/2007   Non-insulin dependent type 2 diabetes mellitus ([place]) 10/08/2006   HYPERCHOLESTEROLEMIA 10/08/2006   HYPERTRIGLYCERIDEMIA 10/08/2006   Migraine headache 10/08/2006   CARPAL TUNNEL SYNDROME 10/08/2006   HYPERTENSION, BENIGN SYSTEMIC 10/08/2006   ASTHMA, PERSISTENT, MODERATE 10/08/2006   Reflux esophagitis 10/08/2006   Peptic ulcer 10/08/2006   Eczema 10/08/2006   Psoriasis 10/08/2006   Osteoarthritis, multiple sites 10/08/2006   VERTIGO NOS OR DIZZINESS 10/08/2006   Past Medical History:  Diagnosis Date   Asthma    Depression    Diabetes mellitus without complication  ([place])    History of surgery on arm    right arm ( plate and screws)   Hypertension    Macular degeneration    Migraine    without aura   Pneumonia    Urinary incontinence     Family History  Problem Relation Age of Onset   Hypertension Mother    Hypertension Father    Hearing loss Sister    Heart attack Sister 66       s/p 4 bypasses    Stroke Brother        7   Breast cancer Maternal Aunt    Ovarian cancer Maternal Aunt     Past Surgical History:  Procedure Laterality Date   ABDOMINAL HYSTERECTOMY     ?Lt. ovary remains   APPENDECTOMY     Arm surgery     BREAST BIOPSY Right 10/10/2009   BREAST CYST ASPIRATION  12/20/2013   BREAST EXCISIONAL BIOPSY Left 1998   BREAST EXCISIONAL BIOPSY Right 1993   ESOPHAGOGASTRODUODENOSCOPY  N/A 04/17/2021   Procedure: ESOPHAGOGASTRODUODENOSCOPY (EGD);  Surgeon: Juanita Craver, MD;  Location: [facility] ENDOSCOPY;  Service: Endoscopy;  Laterality: N/A;  Rectal bleeding and anemia.   ESOPHAGOGASTRODUODENOSCOPY N/A 04/18/2021   Procedure: ESOPHAGOGASTRODUODENOSCOPY (EGD);  Surgeon: Carol Ada, MD;  Location: [facility];  Service: Endoscopy;  Laterality: N/A;   HOT HEMOSTASIS N/A 04/18/2021   Procedure: HOT HEMOSTASIS (ARGON PLASMA COAGULATION/BICAP);  Surgeon: Carol Ada, MD;  Location: [facility];  Service: Endoscopy;  Laterality: N/A;   IR ANGIOGRAM SELECTIVE EACH ADDITIONAL VESSEL  04/17/2021   IR ANGIOGRAM VISCERAL SELECTIVE  04/17/2021   IR EMBO ART  VEN HEMORR LYMPH EXTRAV  INC GUIDE ROADMAPPING  04/17/2021   IR US GUIDE VASC ACCESS RIGHT  04/17/2021   RADIOLOGY WITH ANESTHESIA N/A 04/17/2021   Procedure: RADIOLOGY WITH ANESTHESIA;  Surgeon: Radiologist, Medication, MD;  Location: [facility];  Service: Radiology;  Laterality: N/A;   SUBMUCOSAL INJECTION  04/18/2021   Procedure: SUBMUCOSAL INJECTION;  Surgeon: Carol Ada, MD;  Location: [facility] ENDOSCOPY;  Service: Endoscopy;;   Social History   Occupational History   Not on file  Tobacco Use   Smoking  status: Former    Packs/day: 2.00    Years: 33.00    Total pack years: 66.00    Types: Cigarettes    Start date: 08/11/1957    Quit date: 04/04/1991    Years since quitting: 30.9   Smokeless tobacco: Never  Vaping Use   Vaping Use: Never used  Substance and Sexual Activity   Alcohol use: No   Drug use: No   Sexual activity: Not on file

## 2022-02-28 NOTE — Progress Notes (Signed)
Xr cervical spine

## 2022-03-12 ENCOUNTER — Other Ambulatory Visit (HOSPITAL_COMMUNITY)
Admission: RE | Admit: 2022-03-12 | Discharge: 2022-03-12 | Disposition: A | Discharge status: Home / Self Care | Payer: PPO | Source: Ambulatory Visit | Attending: Obstetrics and Gynecology | Admitting: Obstetrics and Gynecology

## 2022-03-12 ENCOUNTER — Encounter: Payer: Self-pay | Admitting: Obstetrics and Gynecology

## 2022-03-12 ENCOUNTER — Ambulatory Visit (INDEPENDENT_AMBULATORY_CARE_PROVIDER_SITE_OTHER): Payer: PPO | Admitting: Obstetrics and Gynecology

## 2022-03-12 VITALS — BP 167/95 | HR 102

## 2022-03-12 DIAGNOSIS — N952 Postmenopausal atrophic vaginitis: Secondary | ICD-10-CM | POA: Diagnosis not present

## 2022-03-12 DIAGNOSIS — N3281 Overactive bladder: Secondary | ICD-10-CM | POA: Diagnosis not present

## 2022-03-12 DIAGNOSIS — B379 Candidiasis, unspecified: Secondary | ICD-10-CM | POA: Diagnosis not present

## 2022-03-12 DIAGNOSIS — E250 Congenital adrenogenital disorders associated with enzyme deficiency: Secondary | ICD-10-CM | POA: Diagnosis not present

## 2022-03-12 MED ORDER — TROSPIUM CHLORIDE ER 60 MG PO CP24: 1.0000 | ORAL_CAPSULE | Freq: Every day | ORAL | 5 refills | Status: DC

## 2022-03-12 MED ORDER — ESTRADIOL 0.1 MG/GM VA CREA: TOPICAL_CREAM | VAGINAL | 11 refills | Status: DC

## 2022-03-12 MED ORDER — FLUCONAZOLE 150 MG PO TABS: 150.0000 mg | ORAL_TABLET | Freq: Once | ORAL | 0 refills | Status: AC

## 2022-03-12 MED ORDER — TROSPIUM CHLORIDE 20 MG PO TABS: 20.0000 mg | ORAL_TABLET | Freq: Two times a day (BID) | ORAL | 5 refills | Status: DC

## 2022-03-12 NOTE — Addendum Note (Signed)
Addended by: Jaquita Folds on: 03/12/2022 03:58 PM   Modules accepted: Orders

## 2022-03-12 NOTE — Addendum Note (Signed)
Addended by: Jaquita Folds on: 03/12/2022 02:10 PM   Modules accepted: Orders

## 2022-03-12 NOTE — Patient Instructions (Addendum)
Start vaginal estrogen therapy nightly for two weeks then 2 times weekly at night for treatment of vaginal atrophy (dryness of the vaginal tissues).  Please let us know if the prescription is too expensive and we can look for alternative options.    Botulinum Toxin Type A (Botox) Injection Information Today we reviewed the procedure for bladder Botox injection with cystoscopy in the office and reviewed the risks, benefits and alternatives of treatment including but not limited to infection, need for self-catheterization and need for repeat therapy.  There is a 10-15% chance of needing to catheterize with Botox for a few weeks but sometimes can be for longer periods of time.  Typically Botox injections need to be repeated every 6-12 months since this is not a permanent treatment.  We will schedule you for a cystoscopy (looking inside the bladder with a camera) and injection of Botulinum Toxin Type A (Botox) into the bladder.

## 2022-03-12 NOTE — Progress Notes (Addendum)
[facility] Return Visit  SUBJECTIVE  History of Present Illness: [name] is a 81 y.o. female seen in follow-up for mixed incontinence, Urge predominant.    On Myrbetriq '50mg'$  and she does not feel it is working well. Has a lot of leakage at night. When she wakes up at 3am to urinate, her diaper is saturated. During the day has a lot of urinary frequency and leakage as well.   Drinks 1 cup coffee in AM, water, 1 can soda (coke) at dinner. She only has small sips of water after dinner.   Has some vaginal burning and irritation for the last few days.   Past Medical History: Patient  has a past medical history of Asthma, Depression, Diabetes mellitus without complication ([name]), History of surgery on arm, Hypertension, Macular degeneration, Migraine, Pneumonia, and Urinary incontinence.   Past Surgical History: She  has a past surgical history that includes Appendectomy; Arm surgery; Breast cyst aspiration (12/20/2013); Breast biopsy (Right, 10/10/2009); Breast excisional biopsy (Left, 1998); Breast excisional biopsy (Right, 1993); Abdominal hysterectomy; IR US Guide Vasc Access Right (04/17/2021); IR Angiogram Visceral Selective (04/17/2021); IR EMBO ART  VEN HEMORR LYMPH EXTRAV  INC GUIDE ROADMAPPING (04/17/2021); IR Angiogram Selective Each Additional Vessel (04/17/2021); Radiology with anesthesia (N/A, 04/17/2021); Esophagogastroduodenoscopy (N/A, 04/17/2021); Esophagogastroduodenoscopy (N/A, 04/18/2021); submucosal injection (04/18/2021); and Hot hemostasis (N/A, 04/18/2021).   Medications: She has a current medication list which includes the following prescription(s): [START ON 03/13/2022] estradiol, fluconazole, trospium chloride, acetic acid-hydrocortisone, albuterol, artificial tear ointment, baclofen, benzonatate, buspirone, duloxetine, enalapril, famotidine, onetouch verio, onetouch delica plus [id number], metoprolol succinate, mirabegron er, multivitamin with minerals, ondansetron,  oxycodone-acetaminophen, pantoprazole, rosuvastatin, ozempic (1 mg/dose), sucralfate, and triamcinolone ointment.   Allergies: Patient is allergic to amoxicillin, azithromycin, butalbital, clindamycin hcl, januvia [sitagliptin], levaquin [levofloxacin in d5w], meperidine hcl, sulfamethoxazole, ketorolac, lidocaine, macrodantin [nitrofurantoin macrocrystal], methocarbamol, tramadol, codeine phosphate, etodolac, gabapentin, naproxen, pentazocine-naloxone hcl, sumatriptan, and zonegran.   Social History: Patient  reports that she quit smoking about 30 years ago. Her smoking use included cigarettes. She started smoking about 64 years ago. She has a 66.00 pack-year smoking history. She has never used smokeless tobacco. She reports that she does not drink alcohol and does not use drugs.      OBJECTIVE     Physical Exam: Vitals:   03/12/22 1313  BP: (!) 167/95  Pulse: (!) 102     Gen: No apparent distress, A&O x 3.  Detailed Urogynecologic Evaluation:  Normal external genitalia. Speculum exam shows white vaginal discharge and normal vaginal mucosa with atrophy   ASSESSMENT AND PLAN    [name] is a 81 y.o. with:  1. Overactive bladder   2. Vaginal atrophy   3. Yeast infection     - will treat for yeast infection today- diflucan '150mg'$  x1. Aptima swab obtained to confirm.  - Will also prescribe estrace cream 0.5g nightly for two weeks then twice a week after for atrophy.  - Reviewed options for OAB including botox, SNM and PTNS. She cannot come weekly because she does not drive herself. And she is getting injections into her spine so is concerned about SNM. She would be a good candidate for Botox- will schedule for procedure and self cath teaching.  - In the meantime, she wants to try another medication until she can get botox done. Rx provided for Trospium '60mg'$  ER. Also discussed limiting bladder irritants such as coffee and soda.   Return for Intravesical botox   Jaquita Folds, MD  Addendum: Trospium ER was too expensive. Prescribed Trospium '20mg'$  BID as alternative.

## 2022-03-13 ENCOUNTER — Ambulatory Visit (INDEPENDENT_AMBULATORY_CARE_PROVIDER_SITE_OTHER): Payer: PPO | Admitting: Family Medicine

## 2022-03-13 ENCOUNTER — Encounter: Payer: Self-pay | Admitting: Family Medicine

## 2022-03-13 DIAGNOSIS — E1165 Type 2 diabetes mellitus with hyperglycemia: Secondary | ICD-10-CM

## 2022-03-13 DIAGNOSIS — R634 Abnormal weight loss: Secondary | ICD-10-CM

## 2022-03-13 DIAGNOSIS — E119 Type 2 diabetes mellitus without complications: Secondary | ICD-10-CM | POA: Diagnosis not present

## 2022-03-13 DIAGNOSIS — K279 Peptic ulcer, site unspecified, unspecified as acute or chronic, without hemorrhage or perforation: Secondary | ICD-10-CM

## 2022-03-13 LAB — POCT GLYCOSYLATED HEMOGLOBIN (HGB A1C): HbA1c, POC (controlled diabetic range): 9.8 % — AB (ref 0.0–7.0)

## 2022-03-13 LAB — CERVICOVAGINAL ANCILLARY ONLY
Bacterial Vaginitis (gardnerella): NEGATIVE
Candida Glabrata: NEGATIVE
Candida Vaginitis: POSITIVE — AB
Comment: NEGATIVE
Comment: NEGATIVE
Comment: NEGATIVE

## 2022-03-13 MED ORDER — INSULIN GLARGINE 100 UNIT/ML SOLOSTAR PEN: 10.0000 [IU] | PEN_INJECTOR | Freq: Every day | SUBCUTANEOUS | 11 refills | Status: DC

## 2022-03-13 NOTE — Patient Instructions (Signed)
Get the insulin pen today and give yourself 10 units. Every day, check your blood sugar in the morning.   If your blood sugar is higher than 150, increase your dose by 1 unit.  If your blood sugar is between 100 and 150, keep the same dose.  If the blood sugar is less than 100, cut back one unit.  See me in one month.  Sooner if you are doing poorly

## 2022-03-14 ENCOUNTER — Encounter: Payer: Self-pay | Admitting: Family Medicine

## 2022-03-14 ENCOUNTER — Ambulatory Visit: Payer: PPO | Admitting: Orthopedic Surgery

## 2022-03-14 NOTE — Assessment & Plan Note (Signed)
I believe her weight loss is due to poorly controled DM and should improve with better DM control.  FU in one month.  I want to be vigilant for the possibility of an occult medical problem presenting as weight loss.

## 2022-03-14 NOTE — Assessment & Plan Note (Signed)
Epigastric pain raises some concern.  On PPI.  Not on NSAID.  Steroid could cause Cushing ulcer.  Observe for now.

## 2022-03-14 NOTE — Progress Notes (Signed)
    SUBJECTIVE:   CHIEF COMPLAINT / HPI:   Wt loss and more. [name] was doing well from a weight and diabetes standpoint until 2-3 weeks ago when she go a steroid injection to her back(02/24/22).  Since then, her blood sugars have run very high and she has had unintentional weight loss.  Her back feels better.  Her only other new symptom is that she has some epigastric discomfort.  No vomiting.  No bleeding.  Seh feels she has been eating a normal quantity of food.  Of note, she is on high dose semaglutide.  Blood sugar log confirms blood sugars mostly in the 300s.  The most recent BS was mid 200s (maybe steroids are wearing off.)    OBJECTIVE:   BP 120/62   Pulse (!) 104   Ht '4\' 11"'$  (1.499 m)   Wt 171 lb (77.6 kg)   SpO2 95%   BMI 34.54 kg/m   Gen appearance is normal.  8 lb wight loss noted. Lungs clear Cardiac RRR without m or g Abd mild epigastric tenderness.  A1C has jumped considerably to 9.8  ASSESSMENT/PLAN:   Poorly controlled type 2 diabetes mellitus (H[facility]) Marked jump with only change being steroid injection.  Start glargine insulin titrating up and down based on FBS.  Strong cautions about future steroid injections.  Weight loss, unintentional I believe her weight loss is due to poorly controled DM and should improve with better DM control.  FU in one month.  I want to be vigilant for the possibility of an occult medical problem presenting as weight loss.  Peptic ulcer Epigastric pain raises some concern.  On PPI.  Not on NSAID.  Steroid could cause Cushing ulcer.  Observe for now.     Zenia Resides, MD [place]

## 2022-03-14 NOTE — Assessment & Plan Note (Signed)
Marked jump with only change being steroid injection.  Start glargine insulin titrating up and down based on FBS.  Strong cautions about future steroid injections.

## 2022-03-17 ENCOUNTER — Telehealth: Payer: Self-pay | Admitting: Obstetrics and Gynecology

## 2022-03-17 DIAGNOSIS — B379 Candidiasis, unspecified: Secondary | ICD-10-CM

## 2022-03-17 MED ORDER — MICONAZOLE NITRATE 200 MG VA SUPP: 200.0000 mg | Freq: Every day | VAGINAL | 0 refills | Status: DC

## 2022-03-17 NOTE — Telephone Encounter (Signed)
Pt has been notifed

## 2022-03-17 NOTE — Telephone Encounter (Signed)
Still having symptoms of yeast infection. Miconazole cream ordered.

## 2022-03-20 ENCOUNTER — Other Ambulatory Visit: Payer: Self-pay

## 2022-03-20 DIAGNOSIS — G8929 Other chronic pain: Secondary | ICD-10-CM

## 2022-03-21 ENCOUNTER — Ambulatory Visit (INDEPENDENT_AMBULATORY_CARE_PROVIDER_SITE_OTHER): Payer: HMO | Admitting: Obstetrics and Gynecology

## 2022-03-21 ENCOUNTER — Other Ambulatory Visit (HOSPITAL_COMMUNITY)
Admission: RE | Admit: 2022-03-21 | Discharge: 2022-03-21 | Disposition: A | Discharge status: Home / Self Care | Payer: PPO | Source: Ambulatory Visit | Attending: Obstetrics and Gynecology | Admitting: Obstetrics and Gynecology

## 2022-03-21 VITALS — BP 143/84 | HR 106

## 2022-03-21 DIAGNOSIS — N898 Other specified noninflammatory disorders of vagina: Secondary | ICD-10-CM | POA: Diagnosis not present

## 2022-03-21 DIAGNOSIS — N3281 Overactive bladder: Secondary | ICD-10-CM

## 2022-03-21 MED ORDER — CEPHALEXIN 250 MG PO CAPS: 250.0000 mg | ORAL_CAPSULE | Freq: Four times a day (QID) | ORAL | 0 refills | Status: AC

## 2022-03-21 NOTE — Progress Notes (Signed)
[facility] Return Visit  SUBJECTIVE  History of Present Illness: [name] is a 81 y.o. female seen in follow-up for overactive bladder. She has botox scheduled on 8/14 and is here to learn to self- cath.   She has been treated for yeast infection with both diflucan and miconazole cream but still having symptoms of itching.   Past Medical History: Patient  has a past medical history of Asthma, Depression, Diabetes mellitus without complication ([place]), History of surgery on arm, Hypertension, Macular degeneration, Migraine, Pneumonia, and Urinary incontinence.   Past Surgical History: She  has a past surgical history that includes Appendectomy; Arm surgery; Breast cyst aspiration (12/20/2013); Breast biopsy (Right, 10/10/2009); Breast excisional biopsy (Left, 1998); Breast excisional biopsy (Right, 1993); Abdominal hysterectomy; IR US Guide Vasc Access Right (04/17/2021); IR Angiogram Visceral Selective (04/17/2021); IR EMBO ART  VEN HEMORR LYMPH EXTRAV  INC GUIDE ROADMAPPING (04/17/2021); IR Angiogram Selective Each Additional Vessel (04/17/2021); Radiology with anesthesia (N/A, 04/17/2021); Esophagogastroduodenoscopy (N/A, 04/17/2021); Esophagogastroduodenoscopy (N/A, 04/18/2021); submucosal injection (04/18/2021); and Hot hemostasis (N/A, 04/18/2021).   Medications: She has a current medication list which includes the following prescription(s): cephalexin, acetic acid-hydrocortisone, albuterol, artificial tear ointment, baclofen, benzonatate, buspirone, duloxetine, enalapril, estradiol, famotidine, onetouch verio, insulin glargine, onetouch delica plus [id number], metoprolol succinate, miconazole, mirabegron er, multivitamin with minerals, ondansetron, oxycodone-acetaminophen, pantoprazole, rosuvastatin, ozempic (1 mg/dose), sucralfate, triamcinolone ointment, and trospium.   Allergies: Patient is allergic to amoxicillin, azithromycin, butalbital, clindamycin hcl, januvia [sitagliptin], levaquin  [levofloxacin in d5w], meperidine hcl, sulfamethoxazole, ketorolac, lidocaine, macrodantin [nitrofurantoin macrocrystal], methocarbamol, tramadol, codeine phosphate, etodolac, gabapentin, naproxen, pentazocine-naloxone hcl, sumatriptan, and zonegran.   Social History: Patient  reports that she quit smoking about 30 years ago. Her smoking use included cigarettes. She started smoking about 64 years ago. She has a 66.00 pack-year smoking history. She has never used smokeless tobacco. She reports that she does not drink alcohol and does not use drugs.      OBJECTIVE     Physical Exam: Vitals:   03/21/22 1431  BP: (!) 143/84  Pulse: (!) 106   Gen: No apparent distress, A&O x 3.  Detailed Urogynecologic Evaluation:  Normal external genitalia. Aptima swab obtained of vagina.    ASSESSMENT AND PLAN    Ms. Steger is a 81 y.o. with:  1. Vaginal itching   2. Overactive bladder    - Self- cath teaching provided today - Rx for keflex provided to take the day of the procedure for prophylaxis.  - Aptima swab obtained.   Jaquita Folds, MD  Time spent: I spent 20 minutes dedicated to the care of this patient on the date of this encounter to include pre-visit review of records, face-to-face time with the patient  and post visit documentation and ordering medication/ testing.

## 2022-03-24 ENCOUNTER — Encounter: Payer: Self-pay | Admitting: Obstetrics and Gynecology

## 2022-03-24 ENCOUNTER — Telehealth: Payer: Self-pay

## 2022-03-24 ENCOUNTER — Ambulatory Visit: Payer: HMO | Admitting: Obstetrics and Gynecology

## 2022-03-24 VITALS — BP 138/80 | HR 109

## 2022-03-24 DIAGNOSIS — N3281 Overactive bladder: Secondary | ICD-10-CM | POA: Diagnosis not present

## 2022-03-24 DIAGNOSIS — G8929 Other chronic pain: Secondary | ICD-10-CM

## 2022-03-24 LAB — CERVICOVAGINAL ANCILLARY ONLY
Bacterial Vaginitis (gardnerella): NEGATIVE
Candida Glabrata: NEGATIVE
Candida Vaginitis: NEGATIVE
Comment: NEGATIVE
Comment: NEGATIVE
Comment: NEGATIVE

## 2022-03-24 MED ORDER — LIDOCAINE HCL 2 % IJ SOLN
20.0000 mL | Freq: Once | INTRAMUSCULAR | Status: AC
  Administered 2022-03-24: 400 mg

## 2022-03-24 MED ORDER — LIDOCAINE HCL URETHRAL/MUCOSAL 2 % EX GEL
1.0000 | Freq: Once | CUTANEOUS | Status: AC
  Administered 2022-03-24: 1 via URETHRAL

## 2022-03-24 MED ORDER — OXYCODONE-ACETAMINOPHEN 2.5-325 MG PO TABS: 1.0000 | ORAL_TABLET | Freq: Four times a day (QID) | ORAL | 0 refills | Status: DC | PRN

## 2022-03-24 MED ORDER — OXYCODONE-ACETAMINOPHEN 5-325 MG PO TABS: 0.5000 | ORAL_TABLET | Freq: Four times a day (QID) | ORAL | 0 refills | Status: DC | PRN

## 2022-03-24 NOTE — Telephone Encounter (Signed)
Called patient and informed of message from Dr. Andria Frames.   Patient verbalized understanding.   Talbot Grumbling, RN

## 2022-03-24 NOTE — Progress Notes (Signed)
Intravesical Botox Procedure:  Prior to the procedure, the patient took bactrim for antibiotic prophylaxis. Time out was performed. The bladder was catherized and 20 ml of 2% lidocaine was placed in the bladder and 10 ml of 2% lidocaine jelly placed in the urethra.  Cystoscopy was performed with sterile H2O and a 70 degree scope. Bladder mucosa was noted to be within normal limits. A total of 10 ml (100 untis) of Botox A,  Lot # [id number] 10/2023  was injected in the detrusor muscle via 5 injections of 58m each. These were spaced about 1 cm apart. Care was taken to avoid the ureteral orifices and the trigone. Patient tolerated the procedure well. The patient was observed and voided spontaneously prior to leaving.  Impression: 81y.o. 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 Patient tolerated procedure well.  Plan: Post-procedure instructions given regarding bleeding, infection, urinary retention.  Self-catheterization teaching was previously performed.   Patient will follow up in 4 weeks All questions answered.  MJaquita Folds MD

## 2022-03-24 NOTE — Telephone Encounter (Signed)
Rx sent as requested.

## 2022-03-24 NOTE — Patient Instructions (Signed)
Vulvovaginal moisturizer Options: Vitamin E oil (pump or capsule) or cream (Gene's Vit E Cream)- can also be made into suppositories Coconut oil Silicone-based lubricant for use during intercourse ("wet platinum" is a brand available at most drugstores) Crisco Consider the ingredients of the product - the fewer the ingredients the better!  Directions for Use: Clean and dry your hands Gently dab the vulvar/vaginal area dry as needed Apply a "pea-sized" amount of the moisturizer onto your fingertip Using you other hand, open the labia  Apply the moisturizer to the vulvar/vaginal tissues Wear loose fitting underwear/clothing if possible following application Use moisturizer up to 3 times daily as desired.  For constipation- recommend Senokot-S, which has a laxative and stool softener.    Taking Care of Yourself after Botox Injection   Drink plenty of water for a day or two following your procedure. Try to have about 8 ounces (one cup) at a time, and do this 6 times or more per day unless you have fluid restrictitons AVOID irritative beverages such as coffee, tea, soda, alcoholic or citrus drinks for a day or two, as this may cause burning with urination. For the first 1-2 days after the procedure, your urine may be pink or red in color. You may have some blood in your urine as a normal side effect of the procedure. Large amounts of bleeding or difficulty urinating are NOT normal. Call the nurse line if this happens or go to the nearest Emergency Room if the bleeding is heavy or you cannot urinate at all and cannot self-catheterize and it is after hours.  You may experience some discomfort or a burning sensation with urination after having this procedure. You can use over the counter Azo or pyridium to help with burning and follow the instructions on the packaging. If it does not improve within 1-2 days, or other symptoms appear (fever, chills, or difficulty urinating) call the office to speak to  a nurse.  You may return to normal daily activities such as work, school, driving, exercising and housework on the day of the procedure. If your doctor gave you a prescription, take it as ordered.

## 2022-03-24 NOTE — Telephone Encounter (Signed)
Patient calls nurse line regarding stock issues with oxycodone-acetaminophen 2.5 mg-325 mg.   Reports that [facility] does not have stock and she has called around to several other local pharmacies that do not have it in stock either.   I called and canceled prescription at [facility]. Patient is requesting alternative dosage to be sent in. Pharmacy has 5,7.5 and '10mg'$  available.   Please advise.

## 2022-03-25 ENCOUNTER — Telehealth: Payer: Self-pay | Admitting: Obstetrics and Gynecology

## 2022-03-27 NOTE — Telephone Encounter (Signed)
Pt was called back. Pt was advises not to restart Trospium because it can cause her to go into retention due to her having Botox on 03/21/22. Per Dr.Schroeder's instructions

## 2022-03-28 DIAGNOSIS — I10 Essential (primary) hypertension: Secondary | ICD-10-CM | POA: Diagnosis not present

## 2022-03-28 DIAGNOSIS — Z7985 Long-term (current) use of injectable non-insulin antidiabetic drugs: Secondary | ICD-10-CM | POA: Diagnosis not present

## 2022-03-28 DIAGNOSIS — Z87891 Personal history of nicotine dependence: Secondary | ICD-10-CM | POA: Diagnosis not present

## 2022-03-28 DIAGNOSIS — Z794 Long term (current) use of insulin: Secondary | ICD-10-CM | POA: Diagnosis not present

## 2022-03-28 DIAGNOSIS — E119 Type 2 diabetes mellitus without complications: Secondary | ICD-10-CM | POA: Diagnosis not present

## 2022-03-28 DIAGNOSIS — Z6834 Body mass index (BMI) 34.0-34.9, adult: Secondary | ICD-10-CM | POA: Diagnosis not present

## 2022-04-03 ENCOUNTER — Telehealth: Payer: Self-pay

## 2022-04-03 DIAGNOSIS — E119 Type 2 diabetes mellitus without complications: Secondary | ICD-10-CM

## 2022-04-03 MED ORDER — INSULIN GLARGINE 100 UNIT/ML SOLOSTAR PEN: 20.0000 [IU] | PEN_INJECTOR | Freq: Every day | SUBCUTANEOUS | 11 refills | Status: DC

## 2022-04-03 NOTE — Telephone Encounter (Signed)
Called patient to inform that prescription had been refilled.   Patient has additional questions for provider regarding how she should take insulin. She has some confusion regarding if she is still supposed to continue titrating or be on the same dose daily.   Advised that PCP is out of the office until next week. She will continue current regimen until she speaks with provider about increasing. Blood sugars have been fluctuating between 90's-110's.   Will forward to PCP for further clarification.   Talbot Grumbling, RN

## 2022-04-03 NOTE — Telephone Encounter (Signed)
Rx sent as requested.

## 2022-04-03 NOTE — Telephone Encounter (Signed)
Patient calls nurse line regarding Lantus refill. Patient reports that she is completely out of the medication and pharmacy states that she cannot refill until 8/26.   Called and spoke with pharmacist. With current directions, insurance will not pay before 8/26. Advised that patient was supposed to be checking blood sugar daily and titrating accordingly.   Pharmacist is asking that prescription to be updated with the maximum amount for daily dosage. (He recommended 15 or 20 units daily max to give room for increases if needed)   I will forward to PCP for further management.   Talbot Grumbling, RN

## 2022-04-04 NOTE — Telephone Encounter (Signed)
Called and answered her questions.   

## 2022-04-10 ENCOUNTER — Ambulatory Visit (INDEPENDENT_AMBULATORY_CARE_PROVIDER_SITE_OTHER): Payer: HMO

## 2022-04-10 ENCOUNTER — Telehealth: Payer: Self-pay

## 2022-04-10 DIAGNOSIS — R102 Pelvic and perineal pain: Secondary | ICD-10-CM

## 2022-04-10 NOTE — Telephone Encounter (Signed)
[name] is a 81 y.o. female called in complains of have a lot of pressure in her abdomen. Pt said she tried to cath but and couldn't. Pt said she was up all night feeling full. Pt was placed on the nurse schedule for a bladder scan.  Message routed to Dr. Wannetta Sender

## 2022-04-10 NOTE — Progress Notes (Signed)
[name] is a 81 y.o. female here for a nurse visit because the pt said she is feeling pain and pressure in her lower abdomen. Pt said she may not be emptying and the pressure kept her up all night. Pt said she was able to cath a little and it did give a little relief but the pain came back.  PVR is 7.  I told the pt I would send a message to Dr. Wannetta Sender and I will call the pt back tomorrow with additional recommendations from Dr. Chauncey Cruel. Pt verbalized understanding.

## 2022-04-15 ENCOUNTER — Other Ambulatory Visit (HOSPITAL_BASED_OUTPATIENT_CLINIC_OR_DEPARTMENT_OTHER): Payer: Self-pay | Admitting: Obstetrics & Gynecology

## 2022-04-15 ENCOUNTER — Other Ambulatory Visit (HOSPITAL_COMMUNITY)
Admission: RE | Admit: 2022-04-15 | Discharge: 2022-04-15 | Disposition: A | Discharge status: Home / Self Care | Payer: HMO | Attending: Obstetrics and Gynecology | Admitting: Obstetrics and Gynecology

## 2022-04-15 ENCOUNTER — Ambulatory Visit (INDEPENDENT_AMBULATORY_CARE_PROVIDER_SITE_OTHER): Payer: HMO

## 2022-04-15 DIAGNOSIS — R350 Frequency of micturition: Secondary | ICD-10-CM

## 2022-04-15 DIAGNOSIS — R82998 Other abnormal findings in urine: Secondary | ICD-10-CM

## 2022-04-15 DIAGNOSIS — N3090 Cystitis, unspecified without hematuria: Secondary | ICD-10-CM

## 2022-04-15 LAB — POCT URINALYSIS DIPSTICK
Bilirubin, UA: NEGATIVE
Glucose, UA: NEGATIVE
Ketones, UA: NEGATIVE
Nitrite, UA: POSITIVE
Protein, UA: NEGATIVE
Spec Grav, UA: 1.02 (ref 1.010–1.025)
Urobilinogen, UA: 0.2 E.U./dL
pH, UA: 5.5 (ref 5.0–8.0)

## 2022-04-15 LAB — URINALYSIS, ROUTINE W REFLEX MICROSCOPIC
Bacteria, UA: NONE SEEN
Bilirubin Urine: NEGATIVE
Glucose, UA: NEGATIVE mg/dL
Hgb urine dipstick: NEGATIVE
Ketones, ur: NEGATIVE mg/dL
Nitrite: POSITIVE — AB
Protein, ur: 30 mg/dL — AB
Specific Gravity, Urine: 1.015 (ref 1.005–1.030)
WBC, UA: >50 WBC/hpf — ABNORMAL HIGH (ref 0–5)
pH: 6 (ref 5.0–8.0)

## 2022-04-15 MED ORDER — PHENAZOPYRIDINE HCL 200 MG PO TABS
200.0000 mg | ORAL_TABLET | Freq: Three times a day (TID) | ORAL | 0 refills | Status: DC | PRN
Start: 2022-04-15 — End: 2022-06-12

## 2022-04-15 MED ORDER — CEPHALEXIN 250 MG PO CAPS: 250.0000 mg | ORAL_CAPSULE | Freq: Four times a day (QID) | ORAL | 0 refills | Status: DC

## 2022-04-15 NOTE — Progress Notes (Signed)
[name] is a 81 y.o. female arrived today with UTI sx. Pt complains of burning and discomfort when uriating Per Dr. Tommas Olp protocol: A urine specimen was collected and POCT Urine was done and urine culture sent to the lab. POCT Urine was POSITIVE  Pt was notified and prescription sent to the preferred pharmacy.

## 2022-04-15 NOTE — Patient Instructions (Signed)
Your Urine dip that was done in office was POSITIVE. I am sending the urine off for culture and you can take AZO over the counter for your discomfort.  We have ordered Bactrim for you to take while we wait for your culture results, hopefully this gives you some relief. We have also ordered Pyridium for the pain and discomfort while urinating We will contact you when the results are back between 3-5 days. If a different antibiotic is needed we will sent the order to the pharmacy and you will be notified. If you have any questions or concerns please feel free to call us at [phone]

## 2022-04-17 ENCOUNTER — Ambulatory Visit: Payer: PPO | Admitting: Family Medicine

## 2022-04-17 ENCOUNTER — Telehealth: Payer: Self-pay

## 2022-04-17 LAB — URINE CULTURE: Culture: >=100000 — AB

## 2022-04-17 NOTE — Telephone Encounter (Signed)
[name] is a 81 y.o. female called in wanting to know if she is on the correct antibiotic. Pt said she is experiencing rawness around the vagina and burning when urinating. I told the patient I would send a letter to Dr. Sabra Heck to see if the antibiotic needs to be changed.

## 2022-04-18 NOTE — Telephone Encounter (Signed)
Left message returning pt.'s call.

## 2022-04-21 ENCOUNTER — Encounter: Payer: Self-pay | Admitting: Obstetrics and Gynecology

## 2022-04-21 ENCOUNTER — Ambulatory Visit (INDEPENDENT_AMBULATORY_CARE_PROVIDER_SITE_OTHER): Payer: HMO | Admitting: Obstetrics and Gynecology

## 2022-04-21 VITALS — BP 114/78 | HR 106

## 2022-04-21 DIAGNOSIS — N3281 Overactive bladder: Secondary | ICD-10-CM

## 2022-04-21 DIAGNOSIS — N393 Stress incontinence (female) (male): Secondary | ICD-10-CM | POA: Diagnosis not present

## 2022-04-21 NOTE — Patient Instructions (Addendum)
Restart Trospium '60mg'$  daily for overactive bladder.   Can also consider physical therapy, pessary, sling or urethral bulking for stress incontinence (leakage with standing).

## 2022-04-21 NOTE — Progress Notes (Signed)
[name]  Date of Visit: 04/21/2022  History of Present Illness: [name] is a 81 y.o. female scheduled today for a post-operative visit.   Surgery: s/p cystoscopy with intravesical botox (100 units) on 03/24/22- in office  Was having sensation of incomplete emptying, but PVR was 62m on 04/10/22. Urine culture did return positive for infection on 04/15/22 (>100,000 E.Coli).  She was having some vulvar   Leakage is about 50% better. She is still getting up 1-2 times per night, so able to go longer stretches. She now gets a normal urge to go to the bathroom and can usually make it. Hard to notice how much she is leaking because she is wearing a diaper and pad together- overall has been using a lot less.   She is still urinating on herself with standing up.   Medications: She has a current medication list which includes the following prescription(s): acetic acid-hydrocortisone, albuterol, artificial tear ointment, baclofen, benzonatate, buspirone, cephalexin, duloxetine, enalapril, estradiol, famotidine, onetouch verio, insulin glargine, onetouch delica plus [id number] metoprolol succinate, miconazole, mirabegron er, multivitamin with minerals, ondansetron, oxycodone-acetaminophen, pantoprazole, phenazopyridine, rosuvastatin, ozempic (1 mg/dose), sucralfate, triamcinolone ointment, and trospium.   Allergies: Patient is allergic to amoxicillin, azithromycin, butalbital, clindamycin hcl, januvia [sitagliptin], levaquin [levofloxacin in d5w], meperidine hcl, sulfamethoxazole, ketorolac, macrodantin [nitrofurantoin macrocrystal], methocarbamol, tramadol, codeine phosphate, etodolac, gabapentin, naproxen, pentazocine-naloxone hcl, sumatriptan, and zonegran.   Physical Exam: BP 114/78   Pulse (!) 106   Gen: No distress  ---------------------------------------------------------  Assessment and Plan:  1. Overactive bladder   2. SUI (stress urinary incontinence, female)     - Has  had improvement in her OAB symptoms with the botox and has been able to decrease pad usage.  - She can restart the trospium to see if she has any additional improvement. Advised her to notify if she is having difficulty emptying bladder.  - For SUI symptoms, discussed option of pessary, physical therapy, sling or botox. She is considering her options. Has tried a pessary initially but it made her incontinence worse.   Follow up 2 months.   MJaquita Folds MD  Time spent: I spent 20 minutes dedicated to the care of this patient on the date of this encounter to include pre-visit review of records, face-to-face time with the patient and post visit documentation and ordering medication/ testing.

## 2022-04-26 ENCOUNTER — Telehealth: Payer: Self-pay | Admitting: Obstetrics and Gynecology

## 2022-04-26 DIAGNOSIS — R102 Pelvic and perineal pain: Secondary | ICD-10-CM

## 2022-04-26 MED ORDER — FLUCONAZOLE 150 MG PO TABS: 150.0000 mg | ORAL_TABLET | Freq: Once | ORAL | 1 refills | Status: DC | PRN

## 2022-04-26 MED ORDER — CYCLOBENZAPRINE HCL 5 MG PO TABS: 5.0000 mg | ORAL_TABLET | Freq: Three times a day (TID) | ORAL | 1 refills | Status: DC | PRN

## 2022-04-26 NOTE — Telephone Encounter (Signed)
Pt called answering service stating she has severe vaginal pain- feels sharp and stabbing. Pain started overnight. She took pyridium and oxycodone and it helped somewhat but pain has returned after some time. She had a bowel movement with the pain. Always constipated. Recently finished a course of abx for UTI but she denies UTI symptoms of burning, urgency or frequency. Not sure if it feels like muscular pain. Had intravesical botox on 8/14 but she feels she is emptying her bladder well.   Unclear etiology but since she has constipation, recommended she take a dose of senokot. Will prescribe diflucan in case the antibiotics caused yeast infection. Also will prescribe low dose of flexeril. She has taken before many years ago. Advised her to take it when she will be home in case it causes sedation. Pt will call again if symptoms do not resolve.

## 2022-04-28 ENCOUNTER — Ambulatory Visit (INDEPENDENT_AMBULATORY_CARE_PROVIDER_SITE_OTHER): Payer: HMO | Admitting: Obstetrics and Gynecology

## 2022-04-28 ENCOUNTER — Other Ambulatory Visit (HOSPITAL_COMMUNITY)
Admission: RE | Admit: 2022-04-28 | Discharge: 2022-04-28 | Disposition: A | Discharge status: Home / Self Care | Payer: HMO | Source: Ambulatory Visit | Attending: Obstetrics and Gynecology | Admitting: Obstetrics and Gynecology

## 2022-04-28 ENCOUNTER — Ambulatory Visit: Payer: PPO

## 2022-04-28 ENCOUNTER — Encounter: Payer: Self-pay | Admitting: Obstetrics and Gynecology

## 2022-04-28 VITALS — BP 138/85 | HR 118

## 2022-04-28 DIAGNOSIS — M62838 Other muscle spasm: Secondary | ICD-10-CM | POA: Diagnosis not present

## 2022-04-28 DIAGNOSIS — N393 Stress incontinence (female) (male): Secondary | ICD-10-CM | POA: Diagnosis not present

## 2022-04-28 DIAGNOSIS — R102 Pelvic and perineal pain: Secondary | ICD-10-CM | POA: Insufficient documentation

## 2022-04-28 DIAGNOSIS — N3000 Acute cystitis without hematuria: Secondary | ICD-10-CM | POA: Diagnosis not present

## 2022-04-28 MED ORDER — TRIAMCINOLONE ACETONIDE 40 MG/ML IJ SUSP
40.0000 mg | Freq: Once | INTRAMUSCULAR | Status: AC
  Administered 2022-04-28: 40 mg via INTRAMUSCULAR

## 2022-04-28 MED ORDER — BUPIVACAINE HCL 0.25 % IJ SOLN
9.0000 mL | Freq: Once | INTRAMUSCULAR | Status: AC
  Administered 2022-04-28: 9 mL

## 2022-04-28 NOTE — Progress Notes (Signed)
[facility]  Date of Visit: 04/28/2022  History of Present Illness: Ms. Cantu is a 81 y.o. female with mixed incontinence, urge predominant.  s/p cystoscopy with intravesical botox (100 units) on 03/24/22- in office  Over the weekend patient called answering service due to vaginal pain. Pain was sharp in nature. Did not burn with urination. She was emptying her bladder. Treated for possible yeast infection with diflucan. Also given flexeril for MSK pain.   Today, she reports pressure and burning in the vagina. She is taking the flexeril every 6 hours and azo and is getting some relief. She feels "raw". Has been using vaginal estrogen.   Medications: She has a current medication list which includes the following prescription(s): acetic acid-hydrocortisone, albuterol, artificial tear ointment, baclofen, benzonatate, buspirone, cephalexin, cyclobenzaprine, duloxetine, enalapril, estradiol, famotidine, fluconazole, onetouch verio, insulin glargine, onetouch delica plus [id number], metoprolol succinate, miconazole, mirabegron er, multivitamin with minerals, ondansetron, oxycodone-acetaminophen, pantoprazole, phenazopyridine, rosuvastatin, ozempic (1 mg/dose), sucralfate, triamcinolone ointment, and trospium, and the following Facility-Administered Medications: bupivacaine and triamcinolone acetonide.   Allergies: Patient is allergic to amoxicillin, azithromycin, butalbital, clindamycin hcl, januvia [sitagliptin], levaquin [levofloxacin in d5w], meperidine hcl, sulfamethoxazole, ketorolac, macrodantin [nitrofurantoin macrocrystal], methocarbamol, tramadol, codeine phosphate, etodolac, gabapentin, naproxen, pentazocine-naloxone hcl, sumatriptan, and zonegran.   Physical Exam: BP 138/85   Pulse (!) 118   Gen: No distress Vaginal exam: Normal external genitalia. Speculum exam showed no discharge, aptima swab obtained. Palpation of pelvic floor muscles reproduced pain.   ---------------------------------------------------------  Trigger point injection:   Informed Consent:  The alternatives, risks and benefits of the procedure were explained to the patient. Risks including, but not limited to discomfort, pain, bleeding, infection, injury to nearby structures, inability to perform the procedure, failure of the procedure were discussed.  All questions were answered and the patient elected to proceed.  Procedure:   The patient was positioned in dorsal lithotomy position.  The vaginal tissues were prepped with Hibiclens solution.  An injection of 10cc of a mixture of 90% anesthetic (0.25% Bupivacaine) and 10% '40mg'$ /ml Triamcinolone acetomide (Kenalog) was performed in multiple in the bilateral levator muscles, a total of 6 injection sites.  Pressure was held over bleeding areas until good hemostasis was achieved.   The patient tolerated the procedure well with no apparent complications.   Assessment and Plan:  1. Vaginal pain   2. Levator spasm   3. SUI (stress urinary incontinence, female)     - Offered trigger point injection into pelvic floor muscles and performed today. Recommended weekly injections. Also continue with the flexeril. May take '10mg'$  flexeril if needed.  - Aptima swab sent today - Urine culture sent to r/o UTI - Also discussed options for SUI- urethral bulking vs sling. She will consider these option.   Follow up 1 week for repeat injection  Jaquita Folds, MD  Time spent: I spent 25 minutes dedicated to the care of this patient on the date of this encounter to include pre-visit review of records, face-to-face time with the patient and post visit documentation and ordering medication/ testing. Additional time was spent on the procedure.

## 2022-04-29 ENCOUNTER — Other Ambulatory Visit: Payer: Self-pay | Admitting: Family Medicine

## 2022-04-29 DIAGNOSIS — G8929 Other chronic pain: Secondary | ICD-10-CM

## 2022-04-29 DIAGNOSIS — F419 Anxiety disorder, unspecified: Secondary | ICD-10-CM

## 2022-04-29 LAB — CERVICOVAGINAL ANCILLARY ONLY
Bacterial Vaginitis (gardnerella): NEGATIVE
Candida Glabrata: NEGATIVE
Candida Vaginitis: NEGATIVE
Comment: NEGATIVE
Comment: NEGATIVE
Comment: NEGATIVE

## 2022-04-29 MED ORDER — OXYCODONE-ACETAMINOPHEN 5-325 MG PO TABS: 0.5000 | ORAL_TABLET | Freq: Four times a day (QID) | ORAL | 0 refills | Status: DC | PRN

## 2022-04-30 LAB — URINE CULTURE: Culture: >=100000 — AB

## 2022-04-30 MED ORDER — NITROFURANTOIN MONOHYD MACRO 100 MG PO CAPS: 100.0000 mg | ORAL_CAPSULE | Freq: Two times a day (BID) | ORAL | 0 refills | Status: AC

## 2022-04-30 NOTE — Addendum Note (Signed)
Addended by: Jaquita Folds on: 04/30/2022 08:15 AM   Modules accepted: Orders

## 2022-05-01 ENCOUNTER — Ambulatory Visit: Payer: PPO | Admitting: Obstetrics and Gynecology

## 2022-05-05 ENCOUNTER — Ambulatory Visit: Payer: HMO | Admitting: Obstetrics and Gynecology

## 2022-05-07 ENCOUNTER — Ambulatory Visit: Payer: HMO | Admitting: Obstetrics and Gynecology

## 2022-05-08 ENCOUNTER — Telehealth: Payer: Self-pay | Admitting: *Deleted

## 2022-05-08 MED ORDER — FAMOTIDINE 40 MG PO TABS: 40.0000 mg | ORAL_TABLET | Freq: Every day | ORAL | 3 refills | Status: DC

## 2022-05-08 NOTE — Telephone Encounter (Signed)
Refill request came in for Famotidine, on med list from historical provider.    Famotidine 40 mg tablet Sig: take one tablet by mouth twice daily. Qty:60  Please send in refill if appropriate to [facility].[name], CMA

## 2022-05-08 NOTE — Telephone Encounter (Signed)
Rx sent.  40 mg once daily, not twice daily. Called patient and informed..  Patient did not initiate request.

## 2022-05-20 ENCOUNTER — Encounter: Payer: Self-pay | Admitting: Obstetrics and Gynecology

## 2022-05-20 ENCOUNTER — Ambulatory Visit (INDEPENDENT_AMBULATORY_CARE_PROVIDER_SITE_OTHER): Payer: HMO | Admitting: Obstetrics and Gynecology

## 2022-05-20 ENCOUNTER — Other Ambulatory Visit (HOSPITAL_COMMUNITY)
Admission: RE | Admit: 2022-05-20 | Discharge: 2022-05-20 | Disposition: A | Discharge status: Home / Self Care | Payer: HMO | Source: Ambulatory Visit | Attending: Obstetrics and Gynecology | Admitting: Obstetrics and Gynecology

## 2022-05-20 VITALS — BP 143/88 | HR 108

## 2022-05-20 DIAGNOSIS — N390 Urinary tract infection, site not specified: Secondary | ICD-10-CM

## 2022-05-20 DIAGNOSIS — R350 Frequency of micturition: Secondary | ICD-10-CM

## 2022-05-20 DIAGNOSIS — L292 Pruritus vulvae: Secondary | ICD-10-CM | POA: Diagnosis not present

## 2022-05-20 DIAGNOSIS — B9620 Unspecified Escherichia coli [E. coli] as the cause of diseases classified elsewhere: Secondary | ICD-10-CM | POA: Insufficient documentation

## 2022-05-20 LAB — POCT URINALYSIS DIPSTICK
Bilirubin, UA: NEGATIVE
Glucose, UA: NEGATIVE
Ketones, UA: NEGATIVE
Nitrite, UA: POSITIVE
Protein, UA: POSITIVE — AB
Spec Grav, UA: 1.02 (ref 1.010–1.025)
Urobilinogen, UA: 0.2 E.U./dL
pH, UA: 6.5 (ref 5.0–8.0)

## 2022-05-20 MED ORDER — NITROFURANTOIN MONOHYD MACRO 100 MG PO CAPS: 100.0000 mg | ORAL_CAPSULE | Freq: Two times a day (BID) | ORAL | 0 refills | Status: AC

## 2022-05-20 MED ORDER — TRIMETHOPRIM 100 MG PO TABS: 100.0000 mg | ORAL_TABLET | Freq: Every day | ORAL | 5 refills | Status: DC

## 2022-05-20 MED ORDER — FLUCONAZOLE 150 MG PO TABS: 150.0000 mg | ORAL_TABLET | Freq: Once | ORAL | 0 refills | Status: AC

## 2022-05-20 MED ORDER — CLOBETASOL PROP EMOLLIENT BASE 0.05 % EX CREA: 1.0000 g | TOPICAL_CREAM | Freq: Every evening | CUTANEOUS | 2 refills | Status: DC

## 2022-05-20 NOTE — Patient Instructions (Addendum)
Continue with vaginal estrogen cream twice a week.   Use clobetasol (steroid cream) twice a day for a month. Then decrease to once daily three times a week.   Take one dose of diflucan for yeast infection.   Start nitrofurantoin twice a day for a week. Then when completed, start trimethoprim once a day to prevent infections.

## 2022-05-20 NOTE — Progress Notes (Signed)
[name]  Date of Visit: 05/20/2022  History of Present Illness: [name] is a 81 y.o. female with mixed incontinence, urge predominant.  s/p cystoscopy with intravesical botox (100 units) on 03/24/22- in office  She feels that UTI symptoms have returned- has burning and urine looks cloudy. Started returning as soon as her antibiotics were finished previously. She has been using vaginal estrogen twice a week.   Vulva has been itching and feels raw again, very irritated. She also had trigger point injections last visit which she felt helped but it increased her blood sugars temporarily so she does not feel she wants to do them again at this time.   Medications: She has a current medication list which includes the following prescription(s): clobetasol prop emollient base, fluconazole, nitrofurantoin (macrocrystal-monohydrate), trimethoprim, acetic acid-hydrocortisone, albuterol, artificial tear ointment, baclofen, benzonatate, buspirone, cephalexin, cyclobenzaprine, duloxetine, enalapril, estradiol, famotidine, fluconazole, onetouch verio, insulin glargine, onetouch delica plus [id number], metoprolol succinate, miconazole, mirabegron er, multivitamin with minerals, ondansetron, oxycodone-acetaminophen, pantoprazole, phenazopyridine, rosuvastatin, ozempic (1 mg/dose), sucralfate, triamcinolone ointment, and trospium.   Allergies: Patient is allergic to amoxicillin, azithromycin, butalbital, clindamycin hcl, januvia [sitagliptin], levaquin [levofloxacin in d5w], meperidine hcl, sulfamethoxazole, ketorolac, macrodantin [nitrofurantoin macrocrystal], methocarbamol, tramadol, codeine phosphate, etodolac, gabapentin, naproxen, pentazocine-naloxone hcl, sumatriptan, and zonegran.   Physical Exam: BP (!) 143/88   Pulse (!) 108   Gen: No distress Vaginal exam: Vulva red and irritated, normal urethra. Swab obtained in vagina  Straight cath performed to obtain urine sample- 50cc obtained.   --------------------------------------------------------- POC urine: large leukocytes, positive nitrites  Assessment and Plan:  1. Urinary frequency   2. Vulvar itching   3. Recurrent UTI   4. Acute UTI     - Aptima swab sent today. Since she is getting antibiotics for a UTI and has a history of yeast infections, will prescribe a dose of diflucan. Will also start clobetasol ointment 1g twice a day on the vulva for a month then can decrease to three times a week.  - Urine culture sent. For acute UTI, will treat with macrobid '100mg'$  twice a day for 7 days. Then for recurrent UTI, will start trimethoprim '100mg'$  daily for 6 months.  - She is planning urethral bulking later this month for her UTI.   Follow up 1 week for repeat injection  Jaquita Folds, MD

## 2022-05-21 LAB — CERVICOVAGINAL ANCILLARY ONLY
Bacterial Vaginitis (gardnerella): NEGATIVE
Candida Glabrata: NEGATIVE
Candida Vaginitis: NEGATIVE
Comment: NEGATIVE
Comment: NEGATIVE
Comment: NEGATIVE

## 2022-05-22 ENCOUNTER — Ambulatory Visit: Payer: PPO | Admitting: Family Medicine

## 2022-05-22 LAB — URINE CULTURE: Culture: >=100000 — AB

## 2022-05-26 ENCOUNTER — Telehealth: Payer: Self-pay

## 2022-05-26 DIAGNOSIS — G8929 Other chronic pain: Secondary | ICD-10-CM

## 2022-05-26 MED ORDER — OXYCODONE-ACETAMINOPHEN 5-325 MG PO TABS: 0.5000 | ORAL_TABLET | Freq: Four times a day (QID) | ORAL | 0 refills | Status: DC | PRN

## 2022-05-26 NOTE — Telephone Encounter (Signed)
Called.  Patient with longstanding pain issues.  Having headache and neck pain since dental extraction on 10/11.  She is using ice and topical voltaren gel.  She has seen dentist and no indication of infection or other problem.  Imp is acute on chronic pain.  I authorized an early refill of her oxycodone.

## 2022-05-26 NOTE — Telephone Encounter (Signed)
Received phone call from Paulita Cradle at the [facility] regarding patient. He reports that patient called in today complaining of severe headache with nausea and teeth and facial pain.   Patient had 5 teeth extracted on 10/11.  Due to patient's multiple allergies and medications, they are requesting that PCP manage pain medicine.   Called patient to discuss further. She reports that she has been having headache and right sided neck pain since procedure. She had the majority of teeth removed on right side. She states that she also notices blurry vision at times with headache. She states that this improves after taking pain medication. She is taking half a tablet of oxycodone-acetaminophen 5-325 mg every 6 hours. She reports that she has also been supplementing with 500 mg tylenol. She states that she only does this about twice per day due to tylenol dosing with oxycodone. Patient checked BP while on phone. BP 118/90. Denies CP, SHOB.    Will forward to PCP for further pain medicine management. Red flags discussed.   Talbot Grumbling, RN

## 2022-05-29 ENCOUNTER — Ambulatory Visit: Payer: PPO | Admitting: Family Medicine

## 2022-06-02 ENCOUNTER — Encounter: Payer: Self-pay | Admitting: *Deleted

## 2022-06-09 ENCOUNTER — Ambulatory Visit: Payer: HMO | Admitting: Obstetrics and Gynecology

## 2022-06-09 NOTE — Progress Notes (Deleted)
Bulkamid Injection  CC: 81 y.o. y.o. F with stress incontinence who presents for transurethral Bulkamid injection.  Patient signed her consent form.  She started antibiotic prophylaxis today.  Procedure: Time out was performed. The bladder was catheterized and 10 ml of 2% lidocaine jelly placed in the urethra. A urethral block was performed by injecting 37m of 1% lidocaine with epinephrine at 3 and 6 o'clock adjacent to the urethra.  The needle was primed.  The cystoscope was inserted to the level of the bladder neck.  The needle was inserted 2 cm and the scope was pulled back into the urethra 2 cm.  The needle was inserted bevel up at the 5 o'clock position and the Bulkamid was injected to obtain coaptation.  This was repeated at the 2 o'clock,  10 o'clock and 7 o'clock positions.   A total of 2- 187msyringes were used and good circumferential coaptation was noted.  The patient tolerated the procedure well. She was asked to void after the procedure.  Post-Void Residual (PVR) by Bladder Scan: In order to evaluate bladder emptying, we discussed obtaining a postvoid residual and she agreed to this procedure.  Procedure: The ultrasound unit was placed on the patient's abdomen in the suprapubic region after the patient had voided. A PVR of *** ml was obtained by bladder scan.  ASSESSMENT: [age].o. y.o. s/p transurethral Bulkamid injection for stress incontinence.   PLAN: Patient will follow up in 4 weeks to reassess. Voiding and post-procedure precautions were given. She will return for heavy bleeding, fevers, dysuria lasting beyond today and incomplete emptying.  All questions were answered.  MiJaquita FoldsMD

## 2022-06-12 ENCOUNTER — Encounter: Payer: Self-pay | Admitting: Family Medicine

## 2022-06-12 ENCOUNTER — Ambulatory Visit (INDEPENDENT_AMBULATORY_CARE_PROVIDER_SITE_OTHER): Payer: HMO | Admitting: Family Medicine

## 2022-06-12 VITALS — BP 126/78 | HR 92 | Wt 178.8 lb

## 2022-06-12 DIAGNOSIS — Z23 Encounter for immunization: Secondary | ICD-10-CM

## 2022-06-12 DIAGNOSIS — F4323 Adjustment disorder with mixed anxiety and depressed mood: Secondary | ICD-10-CM | POA: Diagnosis not present

## 2022-06-12 DIAGNOSIS — E1165 Type 2 diabetes mellitus with hyperglycemia: Secondary | ICD-10-CM

## 2022-06-12 LAB — POCT GLYCOSYLATED HEMOGLOBIN (HGB A1C): HbA1c, POC (controlled diabetic range): 6.3 % (ref 0.0–7.0)

## 2022-06-12 NOTE — Progress Notes (Signed)
Ms Barua has a host of complaints.  The major ones are DM good control, can she get off insulin.  She had previously been well controled without insulin until getting a steroid lumbar injection.  No steroid injections recently.  A1C=6.3.  Feeling jittery which might be a sign of hypoglycemia. Feeling jittery and occasional globus sensation above manubrium.  Goes along with her jitteriness.  Perhaps related to anxiety.  As above, may be at risk for hypoglycemia. Increased anxiety.  It has been 1.5 years since the death of her husband.  She is having more grief and anxiety recently.  Perhaps related to upcoming holidays and what would have been their 60th wedding anniversary.  Received counseling through hospice after her husband's death.  Would like to try again.  Buspar seems to help. Memory issues seem mild at present.  Certainly could be related to #3.  Will observe for now.

## 2022-06-12 NOTE — Assessment & Plan Note (Signed)
>>  ASSESSMENT AND PLAN FOR ADJUSTMENT REACTION WITH ANXIETY AND DEPRESSION WRITTEN ON 06/12/2022  2:14 PM BY HENSEL, ELSIE LABOR, MD  Reconnect with hospice grief counseling.

## 2022-06-12 NOTE — Assessment & Plan Note (Signed)
Reconnect with hospice grief counseling.

## 2022-06-12 NOTE — Progress Notes (Signed)
    SUBJECTIVE:   CHIEF COMPLAINT / HPI:   [name] is a 81 y.o. female who presents today for follow-up of DM.  She has been compliant with her insulin. She does report feeling shaky in upper body, a "quivering" feeling in her upper chest. She also reports a strange feeling in her throat like she can't swallow, though she denies difficulty actually swallowing. Symptoms began around 1.5 months ago and come on out of the blue. Episodes can last up to hours; she has been having almost daily episodes, increasing in frequency. No chest pain, pressure. No SOB. No LoC, no presyncope, no vision loss. Blood sugars in 90s-110s generally per home readings. She also reports longstanding dizziness at baseline.  Memory starting to become a bit worse; forgets what she is going into a room for but eventually remembers. Takes a bit longer to find words. No forgetting old friends, no difficulty finding way home.  Not very active. Not regularly walking but willing to try.  Husband passed 1.5 years ago, reports more difficulty coping now. She is approaching what would have been their 60th wedding anniversary in mid-November. Haven't spoken with a Social worker. Did talk with someone right after her husband's passing, possibly through Hospice, and she found this very helpful.  PERTINENT  PMH / PSH: HTN, reflux, DM2, hypercholesterolemia  OBJECTIVE:   BP 126/78   Pulse 92   Wt 178 lb 12.8 oz (81.1 kg)   SpO2 94%   BMI 36.11 kg/m   General: Patient is well-appearing and seated comfortably. NAD. Cardiovascular: RRR, no murmurs, rubs, gallops. Pulmonary: Normal work of breathing. Lungs clear to auscultation bilaterally. Neuro/Psych: A&Ox3. Normal affect.  ASSESSMENT/PLAN:   Poorly controlled type 2 diabetes mellitus ([place]) HA1c is under good control at 6.3% today with home BGs largely in the 90s-110s.. Patient does report some "quivering" episodes over the past month and a half, possibly related to  hypoglycemic episodes, possibly related to grief over husband's passing. - Will stop insulin to see if symptoms improve. Continue monitoring home BG. - Instructed pt to call in 2-3 weeks to report whether change in symptoms occurred.   Grief Reaction Patient reports feeling more grief recently about her husband's passing in 12/2020.  - Encouraged pt to reach out to Hospice counseling, as this is likely who she spoke to following her husband's passing.  [name], [place]

## 2022-06-12 NOTE — Assessment & Plan Note (Signed)
HA1c is under good control at 6.3% today with home BGs largely in the 90s-110s.. Patient does report some "quivering" episodes over the past month and a half, possibly related to hypoglycemic episodes, possibly related to grief over husband's passing. - Will stop insulin to see if symptoms improve. Continue monitoring home BG. - Instructed pt to call in 2-3 weeks to report whether change in symptoms occurred.

## 2022-06-12 NOTE — Patient Instructions (Signed)
Ok to stop the insulin.  Call me in 2-3 weeks and let me know about your blood sugars and your quivering.   Call the hospice that took care of [name] and see if you can gets some more counseling. Stop the cyclobenzaprene and continue the baclofen.  You only need one muscle relaxer. Check your med list.  Let me know if my list is not the same as what you are taking.   We will keep making changes one thing at a time until you feel better. With your weight, more exercise would help.

## 2022-06-23 ENCOUNTER — Ambulatory Visit: Payer: HMO | Admitting: Obstetrics and Gynecology

## 2022-07-07 ENCOUNTER — Other Ambulatory Visit: Payer: Self-pay

## 2022-07-07 DIAGNOSIS — G8929 Other chronic pain: Secondary | ICD-10-CM

## 2022-07-07 MED ORDER — OXYCODONE-ACETAMINOPHEN 5-325 MG PO TABS: 0.5000 | ORAL_TABLET | Freq: Four times a day (QID) | ORAL | 0 refills | Status: DC | PRN

## 2022-08-06 ENCOUNTER — Telehealth: Payer: Self-pay

## 2022-08-06 ENCOUNTER — Other Ambulatory Visit: Payer: Self-pay

## 2022-08-06 DIAGNOSIS — G8929 Other chronic pain: Secondary | ICD-10-CM

## 2022-08-06 MED ORDER — OXYCODONE-ACETAMINOPHEN 5-325 MG PO TABS: 0.5000 | ORAL_TABLET | Freq: Four times a day (QID) | ORAL | 0 refills | Status: DC | PRN

## 2022-08-06 NOTE — Telephone Encounter (Signed)
Patient calls nurse line in regards to medication assistance for Ozempic.   She reports she has not received "boxes" in a while.   Per chart review it appears assistance expired on 07/10/2022.  Will forward to [facility] for assistance.

## 2022-08-07 ENCOUNTER — Other Ambulatory Visit: Payer: Self-pay | Admitting: Family Medicine

## 2022-08-07 DIAGNOSIS — I10 Essential (primary) hypertension: Secondary | ICD-10-CM

## 2022-08-07 DIAGNOSIS — G43909 Migraine, unspecified, not intractable, without status migrainosus: Secondary | ICD-10-CM

## 2022-08-08 NOTE — Telephone Encounter (Signed)
Left message informing patient that novo nordisk renewal was submitted.   Also informed that last shipment was used as samples due to not being picked up. Will reach out next week if we have samples available.  Call back [phone]

## 2022-08-08 NOTE — Telephone Encounter (Signed)
Submitted RE-ENROLLMENT application for OZEMPIC to [facility] for patient assistance.   Phone: [phone]   LAST ORDER FROM 01/2022 WAS NEVER PICKED UP. MULTIPLE CALLS MADE TO PT. ORDER WAS USED AS SAMPLES.

## 2022-08-18 ENCOUNTER — Encounter: Payer: Self-pay | Admitting: Family Medicine

## 2022-08-18 ENCOUNTER — Ambulatory Visit (INDEPENDENT_AMBULATORY_CARE_PROVIDER_SITE_OTHER): Payer: PPO | Admitting: Family Medicine

## 2022-08-18 ENCOUNTER — Other Ambulatory Visit: Payer: Self-pay | Admitting: Family Medicine

## 2022-08-18 VITALS — BP 124/68 | HR 72 | Wt 179.8 lb

## 2022-08-18 DIAGNOSIS — E118 Type 2 diabetes mellitus with unspecified complications: Secondary | ICD-10-CM

## 2022-08-18 DIAGNOSIS — L309 Dermatitis, unspecified: Secondary | ICD-10-CM

## 2022-08-18 DIAGNOSIS — D500 Iron deficiency anemia secondary to blood loss (chronic): Secondary | ICD-10-CM | POA: Diagnosis not present

## 2022-08-18 DIAGNOSIS — E1165 Type 2 diabetes mellitus with hyperglycemia: Secondary | ICD-10-CM | POA: Diagnosis not present

## 2022-08-18 DIAGNOSIS — R5383 Other fatigue: Secondary | ICD-10-CM

## 2022-08-18 DIAGNOSIS — K279 Peptic ulcer, site unspecified, unspecified as acute or chronic, without hemorrhage or perforation: Secondary | ICD-10-CM | POA: Diagnosis not present

## 2022-08-18 DIAGNOSIS — F4323 Adjustment disorder with mixed anxiety and depressed mood: Secondary | ICD-10-CM

## 2022-08-18 DIAGNOSIS — L299 Pruritus, unspecified: Secondary | ICD-10-CM

## 2022-08-18 DIAGNOSIS — I10 Essential (primary) hypertension: Secondary | ICD-10-CM | POA: Diagnosis not present

## 2022-08-18 DIAGNOSIS — Z1231 Encounter for screening mammogram for malignant neoplasm of breast: Secondary | ICD-10-CM

## 2022-08-18 MED ORDER — FEXOFENADINE HCL 180 MG PO TABS: 180.0000 mg | ORAL_TABLET | Freq: Every day | ORAL | 6 refills | Status: DC

## 2022-08-18 NOTE — Patient Instructions (Addendum)
For the itiching, try the new prescription, allegra/fexofenidate instead of claritin for one month.  Then use whichever one is better for you.   For the tiredness, I am not sure.  There is nothing obvious.  I will check blood work.  Depression can make you tired.  I would follow up on the grief counseling.  I will call with blood test results. Get some gavison tablets or liquid over the counter antacid.  Give it a try to see if it help the throat when laying down.  Take a slug anytime you lay down. [facility] number is [phone] See me right before I retire on Feb 15 is last day.

## 2022-08-19 ENCOUNTER — Encounter: Payer: Self-pay | Admitting: Family Medicine

## 2022-08-19 LAB — CBC
Hematocrit: 36.8 % (ref 34.0–46.6)
Hemoglobin: 12.3 g/dL (ref 11.1–15.9)
MCH: 29.6 pg (ref 26.6–33.0)
MCHC: 33.4 g/dL (ref 31.5–35.7)
MCV: 89 fL (ref 79–97)
Platelets: 167 10*3/uL (ref 150–450)
RBC: 4.15 x10E6/uL (ref 3.77–5.28)
RDW: 13.7 % (ref 11.7–15.4)
WBC: 5.7 10*3/uL (ref 3.4–10.8)

## 2022-08-19 LAB — CMP14+EGFR
ALT: 18 IU/L (ref 0–32)
AST: 31 IU/L (ref 0–40)
Albumin/Globulin Ratio: 2.6 — ABNORMAL HIGH (ref 1.2–2.2)
Albumin: 4.6 g/dL (ref 3.8–4.8)
Alkaline Phosphatase: 107 IU/L (ref 44–121)
BUN/Creatinine Ratio: 22 (ref 12–28)
BUN: 22 mg/dL (ref 8–27)
Bilirubin Total: 0.5 mg/dL (ref 0.0–1.2)
CO2: 24 mmol/L (ref 20–29)
Calcium: 9.7 mg/dL (ref 8.7–10.3)
Chloride: 101 mmol/L (ref 96–106)
Creatinine, Ser: 1.01 mg/dL — ABNORMAL HIGH (ref 0.57–1.00)
Globulin, Total: 1.8 g/dL (ref 1.5–4.5)
Glucose: 65 mg/dL — ABNORMAL LOW (ref 70–99)
Potassium: 4.7 mmol/L (ref 3.5–5.2)
Sodium: 140 mmol/L (ref 134–144)
Total Protein: 6.4 g/dL (ref 6.0–8.5)
eGFR: 56 mL/min/{1.73_m2} — ABNORMAL LOW (ref >59)

## 2022-08-19 LAB — MICROALBUMIN / CREATININE URINE RATIO
Creatinine, Urine: 58.5 mg/dL
Microalb/Creat Ratio: 15 mg/g creat (ref 0–29)
Microalbumin, Urine: 8.9 ug/mL

## 2022-08-19 LAB — FERRITIN: Ferritin: 26 ng/mL (ref 15–150)

## 2022-08-19 LAB — TSH: TSH: 3.61 u[IU]/mL (ref 0.450–4.500)

## 2022-08-19 NOTE — Assessment & Plan Note (Signed)
Mostly just dry skin with itching.  Switch to allegra to see if claritin contributing to fatigue.

## 2022-08-19 NOTE — Assessment & Plan Note (Signed)
Depression may be causing fatigue.  Encouraged counseling.

## 2022-08-19 NOTE — Assessment & Plan Note (Signed)
Check CBC and ferritin as a potential cause of fatigue.  Both good.  No change.

## 2022-08-19 NOTE — Assessment & Plan Note (Signed)
Good control on current meds. 

## 2022-08-19 NOTE — Assessment & Plan Note (Signed)
At goal A1C.  No change.

## 2022-08-19 NOTE — Assessment & Plan Note (Signed)
>>  ASSESSMENT AND PLAN FOR ADJUSTMENT REACTION WITH ANXIETY AND DEPRESSION WRITTEN ON 08/19/2022  2:31 PM BY HENSEL, ELSIE LABOR, MD  Depression may be causing fatigue.  Encouraged counseling.

## 2022-08-19 NOTE — Assessment & Plan Note (Signed)
Long differential.  Given reassuring labs and exam, most likely dx is depression.  Observe.  Recommended counceling.

## 2022-08-19 NOTE — Progress Notes (Signed)
    SUBJECTIVE:   CHIEF COMPLAINT / HPI:   Multiple issues Sleepy all the time.  Falls asleep while watching TV.  Lacks energy but no focal weakness.  Ongoing for 2-3 months.  On several meds that might be sedating but no new meds in that 2-3 month timeframe.  No recent weight loss   Hx of chronic GI bleed with iron deficiency anemia.  Remains on both PPI and H2 blocker.  No noted bleeding.  Does complain of throat burning some but only when lying down.   Skin itches constantly.  Taking claritin daily.  I note because claritin is sedating for some.  Does have good moisturizing skin care routine. DM.  No recent weight or med change.  Recent A1C =6.3 Sad during holidays.  Not in counseling but thinking about starting.  Discussed how fatigue is often a sign of depression.   OBJECTIVE:   BP 124/68   Pulse 72   Wt 179 lb 12.8 oz (81.6 kg)   SpO2 97%   BMI 36.32 kg/m   Color good.  VS noted Lungs clear Cardiac RRR without m or g Abd benign Skin, no obvious rash.  ASSESSMENT/PLAN:   Iron deficiency anemia due to chronic blood loss Check CBC and ferritin as a potential cause of fatigue.  Both good.  No change.  Adjustment reaction with anxiety and depression Depression may be causing fatigue.  Encouraged counseling.  Eczema Mostly just dry skin with itching.  Switch to allegra to see if claritin contributing to fatigue.  HYPERTENSION, BENIGN SYSTEMIC Good control on current meds.  Other fatigue Long differential.  Given reassuring labs and exam, most likely dx is depression.  Observe.  Recommended counceling.  Diabetes mellitus type 2, controlled, with complications ([place]) At goal A1C.  No change.     Zenia Resides, MD [place]

## 2022-08-21 ENCOUNTER — Ambulatory Visit: Payer: HMO

## 2022-08-22 ENCOUNTER — Ambulatory Visit
Admission: RE | Admit: 2022-08-22 | Discharge: 2022-08-22 | Disposition: A | Discharge status: Home / Self Care | Payer: HMO | Source: Ambulatory Visit | Attending: Family Medicine | Admitting: Family Medicine

## 2022-08-22 DIAGNOSIS — Z1231 Encounter for screening mammogram for malignant neoplasm of breast: Secondary | ICD-10-CM | POA: Diagnosis not present

## 2022-08-23 ENCOUNTER — Other Ambulatory Visit: Payer: Self-pay | Admitting: Family Medicine

## 2022-08-23 DIAGNOSIS — I10 Essential (primary) hypertension: Secondary | ICD-10-CM

## 2022-08-26 ENCOUNTER — Other Ambulatory Visit: Payer: Self-pay | Admitting: Family Medicine

## 2022-08-26 DIAGNOSIS — R928 Other abnormal and inconclusive findings on diagnostic imaging of breast: Secondary | ICD-10-CM

## 2022-08-27 DIAGNOSIS — B029 Zoster without complications: Secondary | ICD-10-CM | POA: Diagnosis not present

## 2022-08-28 DIAGNOSIS — E119 Type 2 diabetes mellitus without complications: Secondary | ICD-10-CM | POA: Diagnosis not present

## 2022-08-28 DIAGNOSIS — Z7985 Long-term (current) use of injectable non-insulin antidiabetic drugs: Secondary | ICD-10-CM | POA: Diagnosis not present

## 2022-09-01 DIAGNOSIS — H3581 Retinal edema: Secondary | ICD-10-CM | POA: Diagnosis not present

## 2022-09-01 DIAGNOSIS — H35033 Hypertensive retinopathy, bilateral: Secondary | ICD-10-CM | POA: Diagnosis not present

## 2022-09-01 DIAGNOSIS — H3563 Retinal hemorrhage, bilateral: Secondary | ICD-10-CM | POA: Diagnosis not present

## 2022-09-01 DIAGNOSIS — E113412 Type 2 diabetes mellitus with severe nonproliferative diabetic retinopathy with macular edema, left eye: Secondary | ICD-10-CM | POA: Diagnosis not present

## 2022-09-01 DIAGNOSIS — E113491 Type 2 diabetes mellitus with severe nonproliferative diabetic retinopathy without macular edema, right eye: Secondary | ICD-10-CM | POA: Diagnosis not present

## 2022-09-02 ENCOUNTER — Other Ambulatory Visit: Payer: Self-pay

## 2022-09-02 DIAGNOSIS — G8929 Other chronic pain: Secondary | ICD-10-CM

## 2022-09-02 MED ORDER — OXYCODONE-ACETAMINOPHEN 5-325 MG PO TABS: 0.5000 | ORAL_TABLET | Freq: Four times a day (QID) | ORAL | 0 refills | Status: DC | PRN

## 2022-09-03 ENCOUNTER — Ambulatory Visit
Admission: RE | Admit: 2022-09-03 | Discharge: 2022-09-03 | Disposition: A | Discharge status: Home / Self Care | Payer: HMO | Source: Ambulatory Visit | Attending: Family Medicine | Admitting: Family Medicine

## 2022-09-03 DIAGNOSIS — R921 Mammographic calcification found on diagnostic imaging of breast: Secondary | ICD-10-CM | POA: Diagnosis not present

## 2022-09-03 DIAGNOSIS — R928 Other abnormal and inconclusive findings on diagnostic imaging of breast: Secondary | ICD-10-CM

## 2022-09-04 ENCOUNTER — Other Ambulatory Visit: Payer: Self-pay | Admitting: Family Medicine

## 2022-09-04 DIAGNOSIS — R921 Mammographic calcification found on diagnostic imaging of breast: Secondary | ICD-10-CM

## 2022-09-05 ENCOUNTER — Telehealth: Payer: Self-pay

## 2022-09-05 DIAGNOSIS — F419 Anxiety disorder, unspecified: Secondary | ICD-10-CM

## 2022-09-05 MED ORDER — BUSPIRONE HCL 5 MG PO TABS: 5.0000 mg | ORAL_TABLET | Freq: Three times a day (TID) | ORAL | 3 refills | Status: DC

## 2022-09-05 NOTE — Telephone Encounter (Signed)
Patient calls nurse line requesting to speak with Dr. Andria Frames regarding multiple things.   She went to the eye doctor on Monday. Diagnosed with Diabetic Retinopathy. Will need to go to Retina specialist. Reports swelling and bleeding in one eye and bleeding in the other.  She wanted to follow up regarding breast imaging and need for biopsy.  She also states that she has shingles. Onset- 08/27/22. Went to UC. No blisters, endorses pain. She has been on acyclovir and will finish today.  She also reports increased anxiety due to everything that is going on.  She states that she would just like to go over everything with Dr. Andria Frames and ask about further management with anxiety.   Forwarding to PCP.   Talbot Grumbling, RN

## 2022-09-05 NOTE — Telephone Encounter (Signed)
Anxiety;  ok to take buspar 3 x /day

## 2022-09-08 DIAGNOSIS — H43813 Vitreous degeneration, bilateral: Secondary | ICD-10-CM | POA: Diagnosis not present

## 2022-09-08 DIAGNOSIS — H353132 Nonexudative age-related macular degeneration, bilateral, intermediate dry stage: Secondary | ICD-10-CM | POA: Diagnosis not present

## 2022-09-08 DIAGNOSIS — H35033 Hypertensive retinopathy, bilateral: Secondary | ICD-10-CM | POA: Diagnosis not present

## 2022-09-08 DIAGNOSIS — E113312 Type 2 diabetes mellitus with moderate nonproliferative diabetic retinopathy with macular edema, left eye: Secondary | ICD-10-CM | POA: Diagnosis not present

## 2022-09-08 DIAGNOSIS — H04123 Dry eye syndrome of bilateral lacrimal glands: Secondary | ICD-10-CM | POA: Diagnosis not present

## 2022-09-08 DIAGNOSIS — E113391 Type 2 diabetes mellitus with moderate nonproliferative diabetic retinopathy without macular edema, right eye: Secondary | ICD-10-CM | POA: Diagnosis not present

## 2022-09-09 ENCOUNTER — Telehealth: Payer: Self-pay

## 2022-09-09 NOTE — Telephone Encounter (Signed)
Patient calls nurse line requesting status of patient assistance for Ozempic. She has been receiving assistance through [employer].   Will forward to [place] for status.  Talbot Grumbling, RN

## 2022-09-15 ENCOUNTER — Other Ambulatory Visit: Payer: Self-pay | Admitting: Family Medicine

## 2022-09-15 DIAGNOSIS — K279 Peptic ulcer, site unspecified, unspecified as acute or chronic, without hemorrhage or perforation: Secondary | ICD-10-CM

## 2022-09-16 ENCOUNTER — Encounter: Payer: Self-pay | Admitting: Family Medicine

## 2022-09-16 NOTE — Telephone Encounter (Signed)
Attempted to call and follow up with novo nordisk for patients enrollment status. Automated system says info is missing... held for almost 2 hours to follow up with a rep, line was disconnected. Havent rec'd any missing info fax from company.   Followed up with patient & informed. She has one pen remaining at home. Taking '1mg'$  dose (about one month supply).

## 2022-09-17 ENCOUNTER — Ambulatory Visit
Admission: RE | Admit: 2022-09-17 | Discharge: 2022-09-17 | Disposition: A | Discharge status: Home / Self Care | Payer: PPO | Source: Ambulatory Visit | Attending: Family Medicine | Admitting: Family Medicine

## 2022-09-17 DIAGNOSIS — N62 Hypertrophy of breast: Secondary | ICD-10-CM | POA: Diagnosis not present

## 2022-09-17 DIAGNOSIS — R921 Mammographic calcification found on diagnostic imaging of breast: Secondary | ICD-10-CM

## 2022-09-17 HISTORY — PX: BREAST BIOPSY: SHX20

## 2022-09-24 ENCOUNTER — Encounter: Payer: Self-pay | Admitting: Family Medicine

## 2022-09-24 ENCOUNTER — Ambulatory Visit (INDEPENDENT_AMBULATORY_CARE_PROVIDER_SITE_OTHER): Payer: PPO | Admitting: Family Medicine

## 2022-09-24 VITALS — BP 132/84 | HR 76 | Wt 181.2 lb

## 2022-09-24 DIAGNOSIS — E118 Type 2 diabetes mellitus with unspecified complications: Secondary | ICD-10-CM | POA: Diagnosis not present

## 2022-09-24 DIAGNOSIS — G894 Chronic pain syndrome: Secondary | ICD-10-CM

## 2022-09-24 DIAGNOSIS — M546 Pain in thoracic spine: Secondary | ICD-10-CM

## 2022-09-24 DIAGNOSIS — F4323 Adjustment disorder with mixed anxiety and depressed mood: Secondary | ICD-10-CM | POA: Diagnosis not present

## 2022-09-24 DIAGNOSIS — M159 Polyosteoarthritis, unspecified: Secondary | ICD-10-CM | POA: Diagnosis not present

## 2022-09-24 DIAGNOSIS — I10 Essential (primary) hypertension: Secondary | ICD-10-CM

## 2022-09-24 DIAGNOSIS — F419 Anxiety disorder, unspecified: Secondary | ICD-10-CM | POA: Diagnosis not present

## 2022-09-24 DIAGNOSIS — G8929 Other chronic pain: Secondary | ICD-10-CM

## 2022-09-24 DIAGNOSIS — R5383 Other fatigue: Secondary | ICD-10-CM

## 2022-09-24 LAB — POCT GLYCOSYLATED HEMOGLOBIN (HGB A1C): HbA1c, POC (controlled diabetic range): 6.1 % (ref 0.0–7.0)

## 2022-09-24 MED ORDER — BUSPIRONE HCL 5 MG PO TABS: 5.0000 mg | ORAL_TABLET | Freq: Three times a day (TID) | ORAL | 3 refills | Status: DC

## 2022-09-24 MED ORDER — OXYCODONE-ACETAMINOPHEN 5-325 MG PO TABS: 0.5000 | ORAL_TABLET | Freq: Four times a day (QID) | ORAL | 0 refills | Status: DC | PRN

## 2022-09-24 NOTE — Patient Instructions (Signed)
I will miss you. I am delighted that things seem to be going well at this moment.   I hope you have a long, happy, healthy and dementia free life.

## 2022-09-24 NOTE — Assessment & Plan Note (Signed)
>>  ASSESSMENT AND PLAN FOR ADJUSTMENT REACTION WITH ANXIETY AND DEPRESSION WRITTEN ON 09/24/2022  1:13 PM BY HENSEL, ELSIE LABOR, MD  Actually at a good place for her right now.

## 2022-09-24 NOTE — Assessment & Plan Note (Signed)
Improved with switch from claritin to allegra.

## 2022-09-24 NOTE — Assessment & Plan Note (Signed)
Actually at a good place for her right now.

## 2022-09-24 NOTE — Assessment & Plan Note (Signed)
Well controled.  No changle.

## 2022-09-24 NOTE — Progress Notes (Signed)
    SUBJECTIVE:   CHIEF COMPLAINT / HPI:   FU fatigue.  Somewhat improved when she switched from claritin to allegra.   DM- A1C is great today.  No hypoglycemic spells. Chronic pain - stable on oxy total 10 mg daily (15 MME) Anxiety managable.  Lives alone (her choice).  Some support from family.  Having some forgetfulness but nothing suggestive of dementia.  She is worried because of a strong FHx of dementia.   OBJECTIVE:   BP 132/84   Pulse 76   Wt 181 lb 3.2 oz (82.2 kg)   SpO2 97%   BMI 36.60 kg/m   Lungs clear Cardiac RRR without m or g  ASSESSMENT/PLAN:   Other fatigue Improved with switch from claritin to allegra.  HYPERTENSION, BENIGN SYSTEMIC Well controled.  No changle.  Chronic pain syndrome Functioning well on stable low dose of percocet.  Adjustment reaction with anxiety and depression Actually at a good place for her right now.     Zenia Resides, MD [place]

## 2022-09-24 NOTE — Assessment & Plan Note (Signed)
Functioning well on stable low dose of percocet.

## 2022-09-25 ENCOUNTER — Other Ambulatory Visit: Payer: Self-pay | Admitting: Family Medicine

## 2022-09-25 DIAGNOSIS — G609 Hereditary and idiopathic neuropathy, unspecified: Secondary | ICD-10-CM

## 2022-09-26 ENCOUNTER — Telehealth: Payer: Self-pay | Admitting: Orthopaedic Surgery

## 2022-09-26 NOTE — Telephone Encounter (Signed)
Patient called. Would like gel injections in her knee, CB # [phone]

## 2022-09-29 NOTE — Telephone Encounter (Signed)
VOB submitted for Monovisc, left knee

## 2022-10-02 IMAGING — XA IR ANGIO/VISCERAL SELECTIVE EA VESSEL WO/W FLUSH
9 of 10 series · 12 of 24 positions shown · IV contrast (IODINE)
Comparison: CTA abdomen and pelvis - 04/16/2021

INDICATION: Acute upper GI bleeding secondary to duodenal ulcer. Please perform
mesenteric arteriogram and percutaneous embolization as indicated.

EXAM:
1. ULTRASOUND GUIDANCE FOR ARTERIAL ACCESS
2. SELECTIVE CELIAC ARTERIOGRAM
3. SUB SELECTIVE COMMON HEPATIC ARTERIOGRAM
4. SUB SELECTIVE GASTRODUODENAL ARTERIOGRAM AND PERCUTANEOUS COIL
EMBOLIZATION

[Series 2: fl (-) angio · 1 of 26 frames shown]
[frame 14/26]
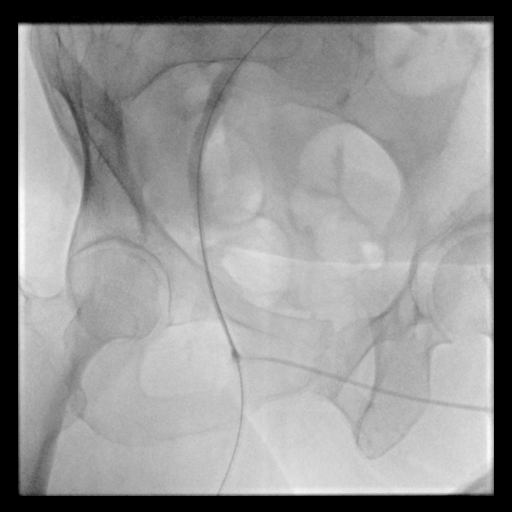

[Series 3: body 4 care · 2 acquisitions, 1 frame shown (1 of 8)]
[im 1/2]
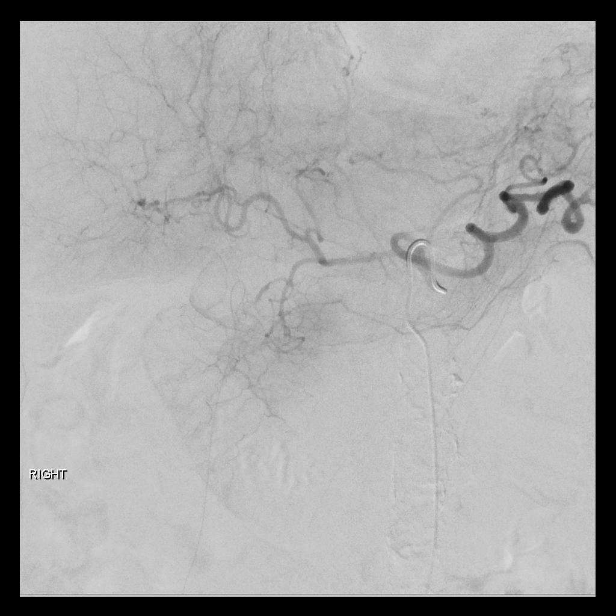

[Series 4: body 4 care · 2 acquisitions, 2 frames shown (2 of 8)]
[im 1/2]
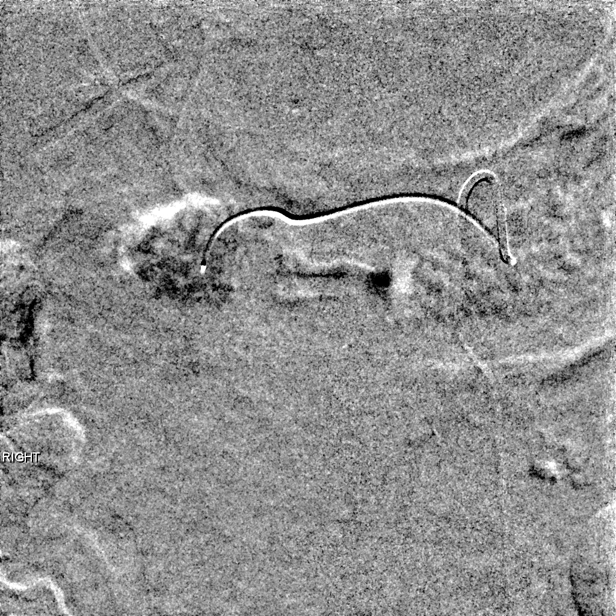
[im 2/2]
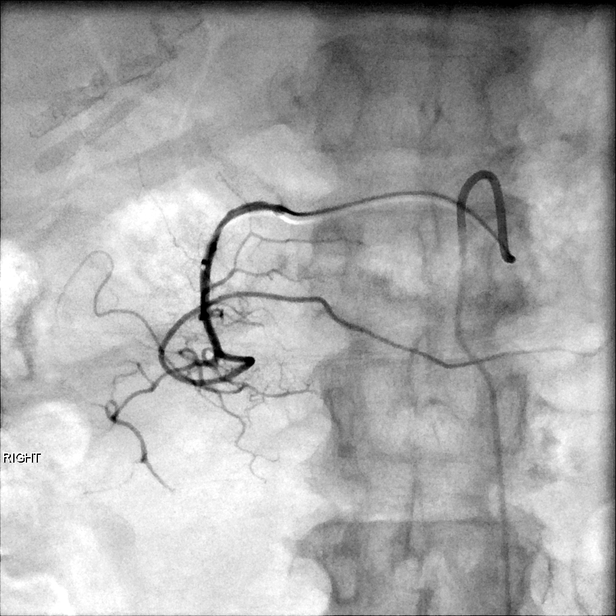

[Series 5: body 4 care · 2 acquisitions, 1 frame shown (3 of 8)]
[im 1/2]
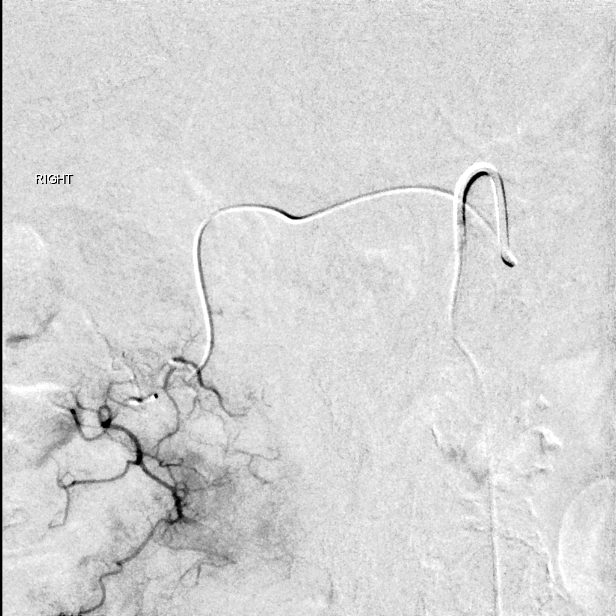

[Series 7: body 4 care · 2 acquisitions, 1 frame shown (4 of 8)]
[im 1/2]
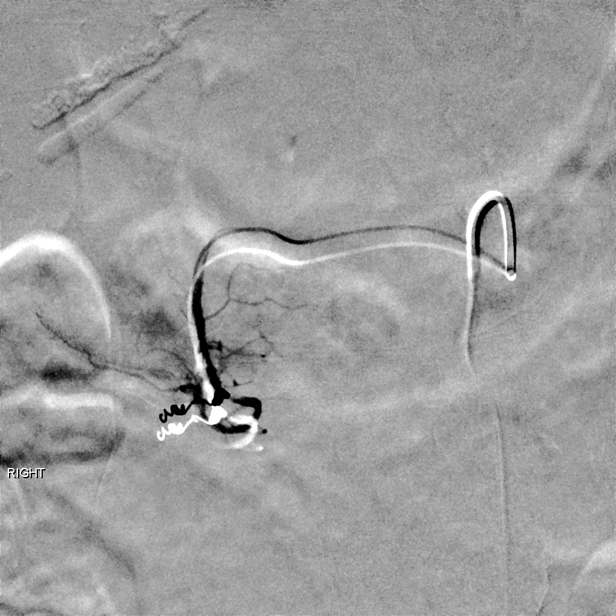

[Series 8: body 4 care · 2 acquisitions, 2 frames shown (5 of 8)]
[im 1/2]
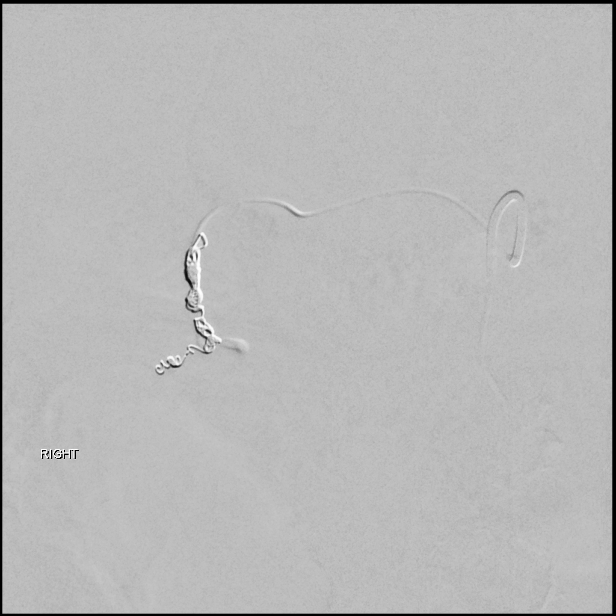
[im 2/2]
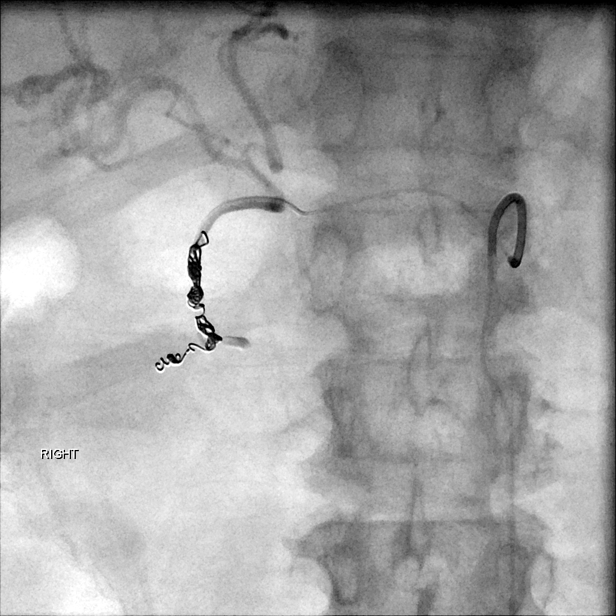

[Series 9: body 4 care · 2 acquisitions, 1 frame shown (6 of 8)]
[im 1/2]
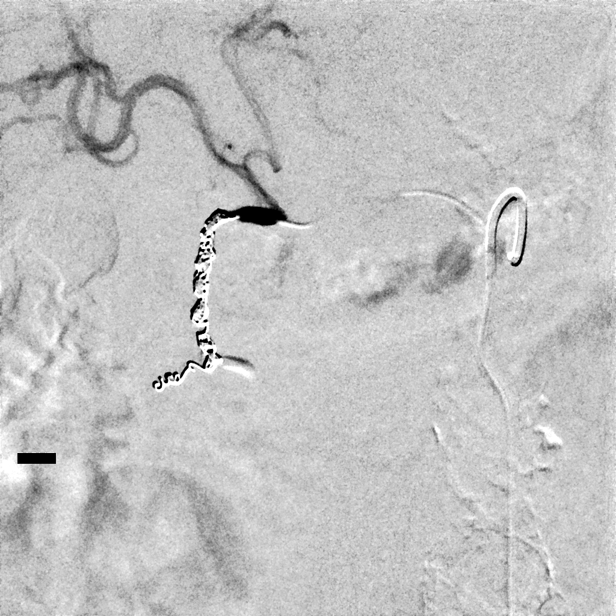

[Series 10: body 4 care · 2 acquisitions, 1 frame shown (7 of 8)]
[im 1/2]
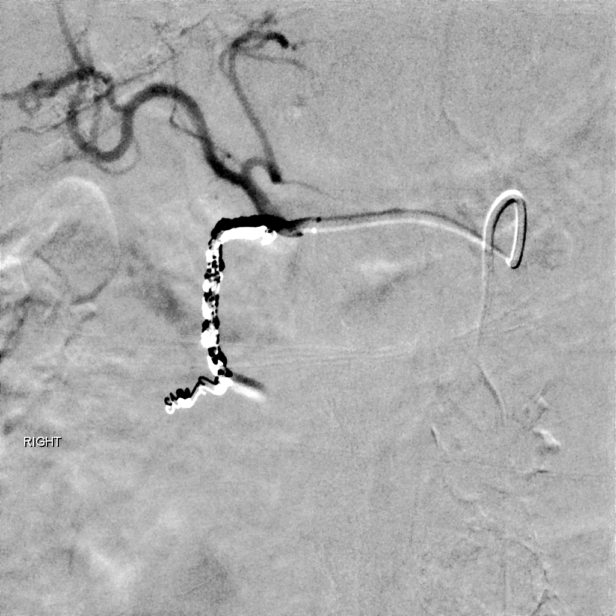

[Series 11: body 4 care · 2 acquisitions, 2 frames shown (8 of 8)]
[im 1/2]
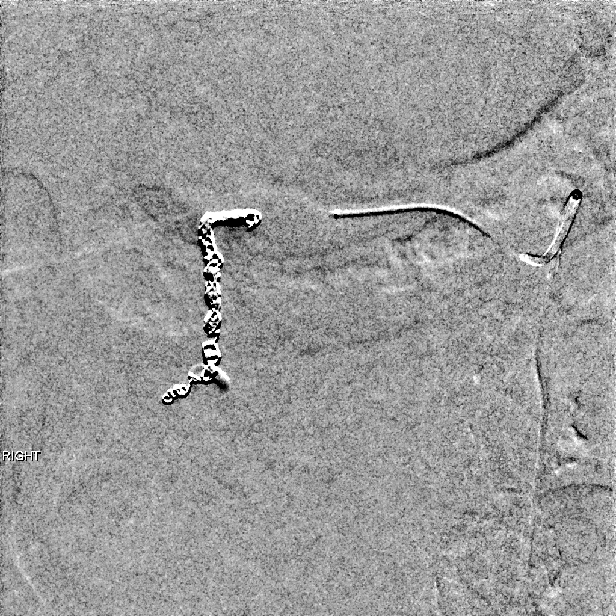
[im 2/2]
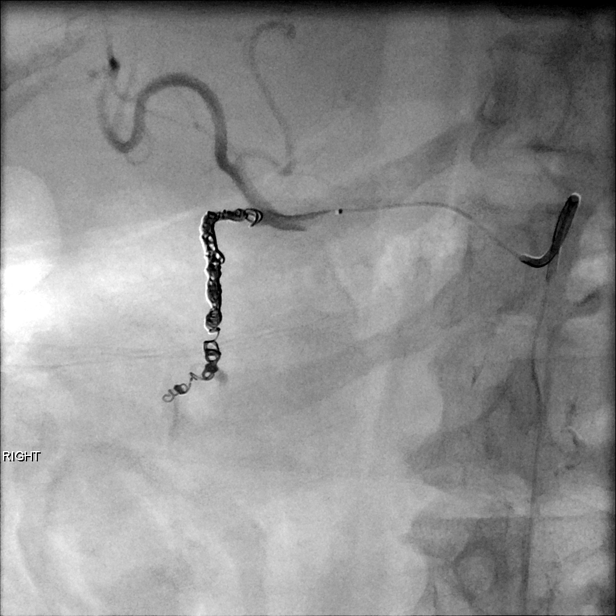

[12 of 24 positions shown; findings below may reference images not displayed]

MEDICATIONS:
None

ANESTHESIA/SEDATION:
Patient remains intubated and sedated by the anesthesia department
following endoscopy

CONTRAST:  75 cc Omnipaque 240

FLUOROSCOPY TIME:  41 minutes, 18 seconds (675 mGy)

COMPLICATIONS:
None immediate.

PROCEDURE:
Informed consent was obtained from the patient following explanation
of the procedure, risks, benefits and alternatives. All questions
were addressed. A time out was performed prior to the initiation of
the procedure. Maximal barrier sterile technique utilized including
caps, mask, sterile gowns, sterile gloves, large sterile drape, hand
hygiene, and Betadine prep.

The right femoral head was marked fluoroscopically. Under sterile
conditions and local anesthesia, the right common femoral artery
access was performed with a micropuncture needle. Under direct
ultrasound guidance, the right common femoral was accessed with a
micropuncture kit. An ultrasound image was saved for documentation
purposes. This allowed for placement of a 5-French vascular sheath.
A limited arteriogram was performed through the side arm of the
sheath confirming appropriate access within the right common femoral
artery.

Over a Bentson wire, a Mickelson catheter was advanced the caudal
aspect of the thoracic aorta where was reformed, back bled and
flushed.

The Mickelson catheter was then utilized to select the celiac artery
and a selective celiac arteriogram was performed

With the use of a fathom 16 microwire, a STC microcatheter was
utilized to select the GDA and a selective gastroduodenal
arteriogram was performed. The microcatheter was then utilized to
select a distal pancreaticoduodenal artery and a sub selective
arteriogram was performed.

The distal pancreaticoduodenal artery as well as the GDA was then
percutaneously coil embolized with multiple overlapping 2 mm, 3 mm,
4 mm and 5 mm interlock soft and fiber coils to the origin of the
GDA. Multiple selective arteriograms were performed during the
embolization.

The microcatheter was then retracted to the level of the common
hepatic artery and a post embolization a shin selective common
hepatic arteriogram was performed

Images reviewed in the procedure was terminated

All wires, catheters and sheaths were removed from the patient.
Hemostasis was achieved at the right groin access site with
deployment of an Angio-Seal closure device and manual compression. A
dressing was applied. The patient tolerated the procedure well with
plans for extubation immediately following completion of the
procedure.
FINDINGS: Selective celiac arteriogram demonstrates an ill-defined area of
extravasation at the location of the descending duodenum (image 45,
series 3), compatible with the findings preceding endoscopy. The
ill-defined area of extravasation appears to be supplied from a
distal branch of one of the pancreaticoduodenal arteries arising
from the mid aspect of the GDA.

Technically successful percutaneous coil embolization of a distal
pancreaticoduodenal branch of the GDA as well as the entirety of the
GDA.
IMPRESSION: Technically successful percutaneous coil embolization of a distal
pancreaticoduodenal branch of the GDA as well as the entirety of the
GDA for acute upper GI bleeding secondary to duodenal ulcer.

PLAN:
- The patient is to remain flat for 4 hours with right leg straight.

- The patient will likely experience several bloody bowel movements
and may continue to require additional resuscitation (as she was
bleeding both before and during the procedure), however ultimately I
am hopeful she will stabilize in the coming days.

- Repeat endoscopy may be performed at the discretion of the GI
service as indicated.

## 2022-10-07 NOTE — Telephone Encounter (Signed)
Patient calls nurse line in regards to [employer] shipment through [employer].   Will forward to [place] for assistance.   Patient reports she has ~ 2 weeks of medication left. She reports continuing with '1mg'$ .

## 2022-10-09 ENCOUNTER — Telehealth: Payer: Self-pay | Admitting: Orthopaedic Surgery

## 2022-10-09 NOTE — Telephone Encounter (Signed)
Received notification from [place] regarding approval for [place]. Patient assistance approved from 10/09/22 to 2/29/25.  MEDICATION WILL SHIP TO OFFICE  Phone: [phone]

## 2022-10-09 NOTE — Telephone Encounter (Signed)
Informed patient enrollment is approved.   Pt has 2 shots (2 weeks) remaining at home. Aware I will call her for pickup soon as med arrives.

## 2022-10-09 NOTE — Telephone Encounter (Signed)
Pt called requesting a call back about updates of approval for gel injection. Please call pt at [phone].

## 2022-10-10 ENCOUNTER — Other Ambulatory Visit: Payer: Self-pay

## 2022-10-10 ENCOUNTER — Telehealth: Payer: Self-pay | Admitting: Orthopaedic Surgery

## 2022-10-10 DIAGNOSIS — M1712 Unilateral primary osteoarthritis, left knee: Secondary | ICD-10-CM

## 2022-10-10 NOTE — Telephone Encounter (Signed)
Called and left a VM for patient to CB to schedule for gel injection with Dr. Ninfa Linden or [place] referral tab

## 2022-10-16 ENCOUNTER — Other Ambulatory Visit: Payer: Self-pay | Admitting: Obstetrics and Gynecology

## 2022-10-16 ENCOUNTER — Telehealth: Payer: Self-pay

## 2022-10-16 ENCOUNTER — Encounter: Payer: Self-pay | Admitting: Radiology

## 2022-10-16 DIAGNOSIS — N3281 Overactive bladder: Secondary | ICD-10-CM

## 2022-10-16 NOTE — Telephone Encounter (Signed)
Patient calls nurse line requesting an appointment for swelling in right leg and calf. Reports that she noticed this yesterday.   Denies known injury. Denies redness or warmth, difficulty breathing or chest pain. Reports that it is painful "only when I rub it." Does not report pain with movement.   Patient is requesting to be seen in our office, would prefer to not go to the ED if possible. Scheduled for evaluation tomorrow afternoon. Provided with strict ED precautions. Patient verbalizes understanding.   Spoke with Dr. Ardelia Mems regarding patient. She recommended that patient be seen tomorrow morning. Rescheduled patient for tomorrow AM.   Talbot Grumbling, RN

## 2022-10-17 ENCOUNTER — Other Ambulatory Visit: Payer: Self-pay

## 2022-10-17 ENCOUNTER — Ambulatory Visit (INDEPENDENT_AMBULATORY_CARE_PROVIDER_SITE_OTHER): Payer: PPO | Admitting: Family Medicine

## 2022-10-17 ENCOUNTER — Ambulatory Visit (HOSPITAL_COMMUNITY)
Admission: RE | Admit: 2022-10-17 | Discharge: 2022-10-17 | Disposition: A | Discharge status: Home / Self Care | Payer: PPO | Source: Ambulatory Visit | Attending: Family Medicine | Admitting: Family Medicine

## 2022-10-17 ENCOUNTER — Ambulatory Visit: Payer: PPO

## 2022-10-17 ENCOUNTER — Other Ambulatory Visit: Payer: Self-pay | Admitting: Obstetrics and Gynecology

## 2022-10-17 VITALS — BP 136/84 | HR 97 | Ht 59.0 in | Wt 183.4 lb

## 2022-10-17 DIAGNOSIS — I10 Essential (primary) hypertension: Secondary | ICD-10-CM

## 2022-10-17 DIAGNOSIS — M7989 Other specified soft tissue disorders: Secondary | ICD-10-CM | POA: Diagnosis not present

## 2022-10-17 DIAGNOSIS — M79604 Pain in right leg: Secondary | ICD-10-CM

## 2022-10-17 DIAGNOSIS — N390 Urinary tract infection, site not specified: Secondary | ICD-10-CM

## 2022-10-17 DIAGNOSIS — L292 Pruritus vulvae: Secondary | ICD-10-CM

## 2022-10-17 MED ORDER — TROSPIUM CHLORIDE 20 MG PO TABS: 20.0000 mg | ORAL_TABLET | Freq: Two times a day (BID) | ORAL | 0 refills | Status: DC

## 2022-10-17 NOTE — Progress Notes (Unsigned)
    SUBJECTIVE:   CHIEF COMPLAINT / HPI:   States right leg started swelling the day before yesterday. Not much pain. Has never happened before  Denies sitting longer than normal, no recent long travel   Medication  Requests refill of Trospium chloride - brought med to this visit. states it helps with her urinary incontiance   States Ozempic was sent here   Takes BP at home but did not bring it   PERTINENT  PMH / PSH: Reviewed   OBJECTIVE:   BP (!) 161/87   Pulse 97   Ht 4\' 11"  (1.499 m)   Wt 183 lb 6.4 oz (83.2 kg)   SpO2 95%   BMI 37.04 kg/m    General: alert, well appearing, NAD CV: RRR no murmurs  Resp: CTAB normal WOB GI: soft, non distended Extremities: LE warm bilaterally. RLE mildly edematous, non erythematous. Tender to palpation in right popliteal fossa and calf   ASSESSMENT/PLAN:   No problem-specific Assessment & Plan notes found for this encounter.    BP initially 161/87  [place]

## 2022-10-17 NOTE — Patient Instructions (Signed)
It was great seeing you today!  We are going to check an ultrasound of your leg to see if you have a blood clot. I will call you with the results and send in treatment if needed.   Enter through the front of [facility] at 1:45pm   Please check-out at the front desk before leaving the clinic. Schedule for a blood pressure check in 2 weeks and bring your home measurements at that time, but if you need to be seen earlier than that for any new issues we're happy to fit you in, just give [place] a call!  Feel free to call with any questions or concerns at any time, at [phone].   Take care,  Dr. Shary Key [facility]

## 2022-10-18 ENCOUNTER — Encounter: Payer: Self-pay | Admitting: Family Medicine

## 2022-10-19 DIAGNOSIS — M7989 Other specified soft tissue disorders: Secondary | ICD-10-CM | POA: Insufficient documentation

## 2022-10-19 NOTE — Assessment & Plan Note (Signed)
BP initially 161/87 though on repeat went down to 136/84. Currently taking Enalapril '10mg'$ , Metoprolol '25mg'$  daily. She does check at home but did not bring her log in. Recommended taking a few more measurements and bringing it to her follow up appointment

## 2022-10-19 NOTE — Assessment & Plan Note (Signed)
Patient presents for 2 days of right leg swelling with mild pain. No inciting trauma, recent travel, new medications. On exam mildly edematous to below knee, and tender to palpation in right popliteal fossa and calf. Suspect DVT given swelling and area of tenderness so ordered stat DVT ultrasound. Could also potentially be venous insufficiency or lymphatic issues. Do not suspect Cellulitis given no skin changes.  - DVT ultrasound scheduled this afternoon

## 2022-10-21 ENCOUNTER — Telehealth: Payer: Self-pay

## 2022-10-21 NOTE — Telephone Encounter (Signed)
Informed patient her novo nordisk shipment is ready for pickup.  4 boxes of Ozempic '1mg'$  dose pens are labeled and ready in med room fridge.

## 2022-10-22 ENCOUNTER — Ambulatory Visit (INDEPENDENT_AMBULATORY_CARE_PROVIDER_SITE_OTHER): Payer: PPO | Admitting: Physician Assistant

## 2022-10-22 ENCOUNTER — Encounter: Payer: Self-pay | Admitting: Physician Assistant

## 2022-10-22 ENCOUNTER — Ambulatory Visit: Payer: Self-pay

## 2022-10-22 DIAGNOSIS — M1712 Unilateral primary osteoarthritis, left knee: Secondary | ICD-10-CM

## 2022-10-22 DIAGNOSIS — M19011 Primary osteoarthritis, right shoulder: Secondary | ICD-10-CM

## 2022-10-22 MED ORDER — LIDOCAINE HCL 1 % IJ SOLN
3.0000 mL | INTRAMUSCULAR | Status: AC | PRN
  Administered 2022-10-22: 3 mL

## 2022-10-22 MED ORDER — HYALURONAN 88 MG/4ML IX SOSY
88.0000 mg | PREFILLED_SYRINGE | INTRA_ARTICULAR | Status: AC | PRN
  Administered 2022-10-22: 88 mg via INTRA_ARTICULAR

## 2022-10-22 NOTE — Progress Notes (Signed)
HPI: [name] comes in today for scheduled left knee Monovisc injection.  Patient has had conservative treatment.  She has known osteoarthritis of the left knee with moderate narrowing lateral joint line and moderate to severe patellofemoral changes.  Osteophytes are off the inferior and superior poles of the patella, also the lateral margin of the lateral joint line.  She has had no new injury to the knee.  No scheduled left knee surgery in the next 6 months.  She is asking about possible cortisone injection in her right shoulder.  She saw Dr. Marlou Sa in July 2023 and at that time her glucose levels were too high to undergo cortisone injection.  She reports today that her hemoglobin A1c was last 6.2.  And her glucose level this morning was 107.  She denies any fevers chills.   Review of systems: See HPI otherwise negative  Physical exam: General well-developed well-nourished female in no acute distress. Right shoulder she has pain with overhead motion.  Weakness with external and internal rotation against resistance. Left knee: No abnormal warmth erythema or effusion.  Valgus malalignment.  Overall good range of motion.  Impression: Left knee osteoarthritis Right shoulder arthropathy  Plan: Will have her see Dr. Arvella Nigh today intra-articular injection under ultrasound.  Right shoulder.  We were able to provide the left knee Monovisc injection without reaction.  She knows to wait least 6 months between injections.  She did ask about her right knee.  We have not worked her right knee out but we will see her back in 2 weeks and at that time we will obtain AP and lateral views of the right knee and most likely give her cortisone injection in the knee at that time.  Questions were encouraged and answered  Procedure Note  Patient: [name]             Date of Birth: 1941/04/29           MRN: [id number]             Visit Date: 10/22/2022  Procedures: Visit Diagnoses:  1. Primary osteoarthritis,  right shoulder     Large Joint Inj: L knee on 10/22/2022 11:54 AM Indications: pain Details: 22 G 1.5 in needle, superolateral approach  Arthrogram: No  Medications: 88 mg Hyaluronan 88 MG/4ML; 3 mL lidocaine 1 % Outcome: tolerated well, no immediate complications Procedure, treatment alternatives, risks and benefits explained, specific risks discussed. Consent was given by the patient. Immediately prior to procedure a time out was called to verify the correct patient, procedure, equipment, support staff and site/side marked as required. Patient was prepped and draped in the usual sterile fashion.

## 2022-10-27 ENCOUNTER — Ambulatory Visit: Payer: PPO | Admitting: Obstetrics and Gynecology

## 2022-10-30 ENCOUNTER — Other Ambulatory Visit: Payer: Self-pay | Admitting: Family Medicine

## 2022-10-30 DIAGNOSIS — G9059 Complex regional pain syndrome I of other specified site: Secondary | ICD-10-CM

## 2022-10-31 NOTE — Telephone Encounter (Signed)
Patient calls nurse line regarding refill.   She states that she is completely out.   Please advise.   Talbot Grumbling, RN

## 2022-10-31 NOTE — Telephone Encounter (Signed)
Patient's nephew presents to clinic for medication. Provided with shipment per note from [place].   Talbot Grumbling, RN

## 2022-11-10 ENCOUNTER — Other Ambulatory Visit: Payer: Self-pay

## 2022-11-10 ENCOUNTER — Encounter: Payer: Self-pay | Admitting: Obstetrics and Gynecology

## 2022-11-10 ENCOUNTER — Ambulatory Visit (INDEPENDENT_AMBULATORY_CARE_PROVIDER_SITE_OTHER): Payer: PPO | Admitting: Obstetrics and Gynecology

## 2022-11-10 VITALS — BP 135/81 | HR 103

## 2022-11-10 DIAGNOSIS — N952 Postmenopausal atrophic vaginitis: Secondary | ICD-10-CM

## 2022-11-10 DIAGNOSIS — L292 Pruritus vulvae: Secondary | ICD-10-CM

## 2022-11-10 DIAGNOSIS — R350 Frequency of micturition: Secondary | ICD-10-CM | POA: Diagnosis not present

## 2022-11-10 DIAGNOSIS — N3281 Overactive bladder: Secondary | ICD-10-CM | POA: Diagnosis not present

## 2022-11-10 DIAGNOSIS — M159 Polyosteoarthritis, unspecified: Secondary | ICD-10-CM

## 2022-11-10 DIAGNOSIS — G8929 Other chronic pain: Secondary | ICD-10-CM

## 2022-11-10 MED ORDER — CLOBETASOL PROP EMOLLIENT BASE 0.05 % EX CREA: 1.0000 g | TOPICAL_CREAM | Freq: Every evening | CUTANEOUS | 2 refills | Status: DC

## 2022-11-10 NOTE — Patient Instructions (Addendum)
The kenalog cream is for the external vaginal irritation. You use that x3 weekly.   The estrogen cream is for Vaginal dryness, you will do this cream as least twice per week.

## 2022-11-10 NOTE — Progress Notes (Signed)
[name]  SUBJECTIVE  History of Present Illness: [name] is a 82 y.o. female seen in follow-up for OAB and vulvar itching. Plan at last visit was start Clobetasol cream for vulvar itching and see how botox works for patient.   Last A1c 6.1 09/24/22  Is not taking Myrbetriq, is taking Trospium 20mg  twice a day.    Past Medical History: Patient  has a past medical history of Asthma, Depression, Diabetes mellitus without complication ([place]), History of surgery on arm, Hypertension, Macular degeneration, Migraine, Pneumonia, and Urinary incontinence.   Past Surgical History: She  has a past surgical history that includes Appendectomy; Arm surgery; Breast cyst aspiration (12/20/2013); Breast biopsy (Right, 10/10/2009); Breast excisional biopsy (Left, 1998); Breast excisional biopsy (Right, 1993); Abdominal hysterectomy; IR US Guide Vasc Access Right (04/17/2021); IR Angiogram Visceral Selective (04/17/2021); IR EMBO ART  VEN HEMORR LYMPH EXTRAV  INC GUIDE ROADMAPPING (04/17/2021); IR Angiogram Selective Each Additional Vessel (04/17/2021); Radiology with anesthesia (N/A, 04/17/2021); Esophagogastroduodenoscopy (N/A, 04/17/2021); Esophagogastroduodenoscopy (N/A, 04/18/2021); submucosal injection (04/18/2021); Hot hemostasis (N/A, 04/18/2021); and Breast biopsy (Right, 09/17/2022).   Medications: She has a current medication list which includes the following prescription(s): acetic acid-hydrocortisone, artificial tear ointment, baclofen, buspirone, clobetasol prop emollient base, duloxetine, enalapril, estradiol, famotidine, fexofenadine, onetouch verio, onetouch delica plus [id number], metoprolol succinate, mirabegron er, multivitamin with minerals, ondansetron, oxycodone-acetaminophen, pantoprazole, rosuvastatin, ozempic (1 mg/dose), triamcinolone ointment, and trospium.   Allergies: Patient is allergic to amoxicillin, azithromycin, butalbital, clindamycin hcl, januvia [sitagliptin],  levaquin [levofloxacin in d5w], meperidine hcl, sulfamethoxazole, ketorolac, macrodantin [nitrofurantoin macrocrystal], methocarbamol, tramadol, codeine phosphate, etodolac, gabapentin, naproxen, pentazocine-naloxone hcl, sumatriptan, trimethoprim, and zonegran.   Social History: Patient  reports that she quit smoking about 31 years ago. Her smoking use included cigarettes. She started smoking about 65 years ago. She has a 66.00 pack-year smoking history. She has never used smokeless tobacco. She reports that she does not drink alcohol and does not use drugs.      OBJECTIVE     Physical Exam: Vitals:   11/10/22 1307  BP: 135/81  Pulse: (!) 103   Gen: No apparent distress, A&O x 3.  Detailed Urogynecologic Evaluation:  Patient has no obvious signs of yeast or other infectious signs. She does have external vaginal irritation and obvious signs of vaginal dryness. Pale vaginal mucous on the anterior wall and around the urethra.     ASSESSMENT AND PLAN    [name] is a 82 y.o. with:  1. Vulvar itching   2. Urinary frequency   3. Vaginal atrophy   4. Overactive bladder    Patient has significant irritation around the vulvar region. She has not been using Clobetasol. We discussed using the clobetasol x3 weekly.  Patient has been taking trospium and reports is is mildly helpful but she is waking up wet. Wants to do another Botox.  Has not been using the estrogen cream. We discussed using this x2 weekly for vaginal dryness, itching, and for UTI prevention.  Patient wants another botox treatment.   Will plan for patient to return in 3 months for follow up of her vaginal atrophy and vulvar irritation.

## 2022-11-11 ENCOUNTER — Telehealth: Payer: Self-pay | Admitting: Family Medicine

## 2022-11-11 ENCOUNTER — Telehealth: Payer: Self-pay

## 2022-11-11 NOTE — Telephone Encounter (Signed)
Contacted Caleya Ryer to schedule their annual wellness visit. Appointment made for 11/18/2022.  Thank you,  [place] Direct [facility]  [phone]

## 2022-11-11 NOTE — Telephone Encounter (Signed)
Patient calls nurse line reporting "peeling gums."   She reports symptoms started shortly after she started Allegra in January. However, have progessively worsened. She reports she feels the inside of her lips are peeling. She reports she has a feeling of "something" in her teeth such as food. However, when she goes to floss it is "loose gums."   She denies any trouble breathing, fevers or bleeding.   She reports she takes Allegra due to "all over" bodily itching. She reports she was on Claritin before which helped, however not as well Allegra.   She does not wish to continue Allegra and would prefer to not go back on Claritin. She would like to try something else.   Advised she may need a clinic apt, however will forward to PCP.

## 2022-11-12 DIAGNOSIS — L578 Other skin changes due to chronic exposure to nonionizing radiation: Secondary | ICD-10-CM | POA: Diagnosis not present

## 2022-11-12 DIAGNOSIS — L218 Other seborrheic dermatitis: Secondary | ICD-10-CM | POA: Diagnosis not present

## 2022-11-12 DIAGNOSIS — L298 Other pruritus: Secondary | ICD-10-CM | POA: Diagnosis not present

## 2022-11-12 DIAGNOSIS — L820 Inflamed seborrheic keratosis: Secondary | ICD-10-CM | POA: Diagnosis not present

## 2022-11-12 DIAGNOSIS — Z09 Encounter for follow-up examination after completed treatment for conditions other than malignant neoplasm: Secondary | ICD-10-CM | POA: Diagnosis not present

## 2022-11-12 DIAGNOSIS — L570 Actinic keratosis: Secondary | ICD-10-CM | POA: Diagnosis not present

## 2022-11-12 DIAGNOSIS — L814 Other melanin hyperpigmentation: Secondary | ICD-10-CM | POA: Diagnosis not present

## 2022-11-12 NOTE — Telephone Encounter (Signed)
Patient should be seen in the office for this - need to examine her mouth. Please ask her to schedule an appointment  Thanks Leeanne Rio, MD

## 2022-11-13 ENCOUNTER — Other Ambulatory Visit: Payer: Self-pay | Admitting: Family Medicine

## 2022-11-13 MED ORDER — OXYCODONE-ACETAMINOPHEN 5-325 MG PO TABS: 0.5000 | ORAL_TABLET | Freq: Four times a day (QID) | ORAL | 0 refills | Status: DC | PRN

## 2022-11-13 NOTE — Telephone Encounter (Signed)
Patient returns call to nurse line. She states that she has four pills left and is afraid of running out.   Talbot Grumbling, RN

## 2022-11-13 NOTE — Telephone Encounter (Signed)
PDMP reviewed, appropriate findings, will refill

## 2022-11-13 NOTE — Telephone Encounter (Signed)
Spoke with patient today regarding message from 11/11/22. She is asking about pain medication refill.   Will forward to Dr. Ardelia Mems.   Talbot Grumbling, RN

## 2022-11-13 NOTE — Telephone Encounter (Signed)
Returned call to patient. Advised of provider message. Patient states that she stopped taking Allegra two days ago. She feels like that this medication was what was contributing to her gums peeling.   She states that she would like to wait a few more days and if condition has not improved, she will schedule appointment.   Talbot Grumbling, RN

## 2022-11-14 ENCOUNTER — Other Ambulatory Visit: Payer: Self-pay

## 2022-11-14 ENCOUNTER — Other Ambulatory Visit: Payer: Self-pay | Admitting: Obstetrics and Gynecology

## 2022-11-14 DIAGNOSIS — N3281 Overactive bladder: Secondary | ICD-10-CM

## 2022-11-17 ENCOUNTER — Other Ambulatory Visit (INDEPENDENT_AMBULATORY_CARE_PROVIDER_SITE_OTHER): Payer: PPO

## 2022-11-17 ENCOUNTER — Encounter: Payer: Self-pay | Admitting: Physician Assistant

## 2022-11-17 ENCOUNTER — Ambulatory Visit (INDEPENDENT_AMBULATORY_CARE_PROVIDER_SITE_OTHER): Payer: PPO | Admitting: Physician Assistant

## 2022-11-17 DIAGNOSIS — M545 Low back pain, unspecified: Secondary | ICD-10-CM | POA: Diagnosis not present

## 2022-11-17 DIAGNOSIS — G8929 Other chronic pain: Secondary | ICD-10-CM

## 2022-11-17 NOTE — Progress Notes (Signed)
Office Visit Note   Patient: [name]           Date of Birth: December 04, 1940           MRN: [id number] Visit Date: 11/17/2022              Requested by: Latrelle Dodrill, MD [address] [place],  [place] [address] PCP: Latrelle Dodrill, MD   Assessment & Plan: Visit Diagnoses:  1. Chronic bilateral low back pain without sciatica     Plan: Given her back pain did offer her MRI.  She would rather try therapy first.  Will send her to formal therapy for core strengthening, home exercise program, stretching and modalities.  See her back in just 4 weeks.  Condition remains unchanged we will send her for an MRI for hopefully epidural steroid planning.  However if her condition becomes significantly worse she can always call the office and we can get her in sooner.  Questions were encouraged and answered at length.  Follow-Up Instructions: Return in about 4 weeks (around 12/15/2022).   Orders:  Orders Placed This Encounter  Procedures   XR Lumbar Spine 2-3 Views   No orders of the defined types were placed in this encounter.     Procedures: No procedures performed   Clinical Data: No additional findings.   Subjective: Chief Complaint  Patient presents with   Left Knee - Pain   Lower Back - Pain    HPI [name] comes in today with low back pain that is causing her to have problems walking first thing in the morning.  She states she takes half an oxycodone and things get better.  This been ongoing for few months.  Several getting worse.  She is someone who has had back issues in the past due to degenerative disc disease and mild to moderate spinal stenosis.  She has seen a chiropractor in the past but not within the last 6 months.  She is also seeing Dr. Danielle Dess in the past.  She reports prior epidural steroid injections that have given her relief.  She denies any numbness tingling down either leg.  Denies any fevers chills no unintentional weight loss.  She  has had some and bladder incontinence which she has been seen by urology for for the past 7 months and this is unchanged.  She does note some waking pain due to the back pain.  No saddle anesthesia like symptoms.  Pain is mostly low back and left buttocks pain.  Review of Systems See HPI otherwise negative  Objective: Vital Signs: There were no vitals taken for this visit.  Physical Exam Constitutional:      Appearance: She is not ill-appearing or diaphoretic.  Pulmonary:     Effort: Pulmonary effort is normal.  Neurological:     Mental Status: She is alert and oriented to person, place, and time.  Psychiatric:        Mood and Affect: Mood normal.     Ortho Exam Lower extremities 5 out of 5 strength throughout except for right great toe extension against resistance 4 out of 5.  Positive straight leg raise bilaterally.  Dorsal pedal pulse 2+ bilaterally equal symmetric.  Sensation grossly intact bilateral feet to light touch.  Specialty Comments:  No specialty comments available.  Imaging: XR Lumbar Spine 2-3 Views  Result Date: 11/17/2022 Lumbar spine 2 views: No acute fractures. Films compared to prior films show interval change in the L4-5 with diminished  disc space compared to prior films.  L3-4 anterior spinal listhesis grade 1 remains unchanged.  Facet changes of the lumbar spine remain unchanged.  Scoliosis remains unchanged.  Aortic arthrosclerosis present.  No acute findings.  No acute fractures.    PMFS History: Patient Active Problem List   Diagnosis Date Noted   Right leg swelling 10/19/2022   Pruritus 08/18/2022   Other fatigue 12/06/2021   Chronic pain syndrome    Rotator cuff syndrome of right shoulder 01/21/2019   Bladder prolapse, female, acquired 08/16/2018   Urinary incontinence 07/05/2018   Adjustment reaction with anxiety and depression 03/26/2017   Iron deficiency anemia due to chronic blood loss 10/31/2015   Chronic right-sided thoracic back pain  07/26/2015   Spinal stenosis of lumbar region 07/10/2015   Abdominal aortic atherosclerosis 07/10/2015   Sleep apnea 05/22/2010   Hereditary and idiopathic peripheral neuropathy 02/06/2010   Irritable bowel syndrome 11/12/2007   Diabetes mellitus type 2, controlled, with complications 10/08/2006   HYPERCHOLESTEROLEMIA 10/08/2006   HYPERTRIGLYCERIDEMIA 10/08/2006   Migraine headache 10/08/2006   Hypertension 10/08/2006   ASTHMA, PERSISTENT, MODERATE 10/08/2006   Reflux esophagitis 10/08/2006   Peptic ulcer 10/08/2006   Eczema 10/08/2006   Psoriasis 10/08/2006   Osteoarthritis, multiple sites 10/08/2006   VERTIGO NOS OR DIZZINESS 10/08/2006   Past Medical History:  Diagnosis Date   Asthma    Depression    Diabetes mellitus without complication    History of surgery on arm    right arm ( plate and screws)   Hypertension    Macular degeneration    Migraine    without aura   Pneumonia    Urinary incontinence     Family History  Problem Relation Age of Onset   Hypertension Mother    Hypertension Father    Hearing loss Sister    Heart attack Sister 36       s/p 4 bypasses    Stroke Brother        69   Breast cancer Maternal Aunt    Ovarian cancer Maternal Aunt     Past Surgical History:  Procedure Laterality Date   ABDOMINAL HYSTERECTOMY     ?Lt. ovary remains   APPENDECTOMY     Arm surgery     BREAST BIOPSY Right 10/10/2009   BREAST BIOPSY Right 09/17/2022   MM RT BREAST BX W LOC DEV 1ST LESION IMAGE BX SPEC STEREO GUIDE 09/17/2022 [facility]   BREAST CYST ASPIRATION  12/20/2013   BREAST EXCISIONAL BIOPSY Left 1998   BREAST EXCISIONAL BIOPSY Right 1993   ESOPHAGOGASTRODUODENOSCOPY N/A 04/17/2021   Procedure: ESOPHAGOGASTRODUODENOSCOPY (EGD);  Surgeon: Charna Elizabeth, MD;  Location: [facility] ENDOSCOPY;  Service: Endoscopy;  Laterality: N/A;  Rectal bleeding and anemia.   ESOPHAGOGASTRODUODENOSCOPY N/A 04/18/2021   Procedure: ESOPHAGOGASTRODUODENOSCOPY (EGD);  Surgeon:  Jeani Hawking, MD;  Location: [facility] ENDOSCOPY;  Service: Endoscopy;  Laterality: N/A;   HOT HEMOSTASIS N/A 04/18/2021   Procedure: HOT HEMOSTASIS (ARGON PLASMA COAGULATION/BICAP);  Surgeon: Jeani Hawking, MD;  Location[facility] ENDOSCOPY;  Service: Endoscopy;  Laterality: N/A;   IR ANGIOGRAM SELECTIVE EACH ADDITIONAL VESSEL  04/17/2021   IR ANGIOGRAM VISCERAL SELECTIVE  04/17/2021   IR EMBO ART  VEN HEMORR LYMPH EXTRAV  INC GUIDE ROADMAPPING  04/17/2021   IR US GUIDE VASC ACCESS RIGHT  04/17/2021   RADIOLOGY WITH ANESTHESIA N/A 04/17/2021   Procedure: RADIOLOGY WITH ANESTHESIA;  Surgeon: Radiologist, Medication, MD;  Location: [facility] OR;  Service: Radiology;  Laterality: N/A;   SUBMUCOSAL  INJECTION  04/18/2021   Procedure: SUBMUCOSAL INJECTION;  Surgeon: Jeani HawkingHung, Patrick, MD;  [facility];  Service: Endoscopy;;   Social History   Occupational History   Not on file  Tobacco Use   Smoking status: Former    Packs/day: 2.00    Years: 33.00    Additional pack years: 0.00    Total pack years: 66.00    Types: Cigarettes    Start date: 08/11/1957    Quit date: 04/04/1991    Years since quitting: 31.6   Smokeless tobacco: Never  Vaping Use   Vaping Use: Never used  Substance and Sexual Activity   Alcohol use: No   Drug use: No   Sexual activity: Not on file

## 2022-11-17 NOTE — Patient Instructions (Signed)

## 2022-11-17 NOTE — Progress Notes (Unsigned)
I connected with  [name] on 11/18/2022 by a audio enabled telemedicine application and verified that I am speaking with the correct person using two identifiers.  Patient Location: Home  Provider Location: Home Office  I discussed the limitations of evaluation and management by telemedicine. The patient expressed understanding and agreed to proceed.   Subjective:   [name] is a 82 y.o. female who presents for an Initial Medicare Annual Wellness Visit.  Review of Systems    Per HPI unless specifically indicated below.  Cardiac Risk Factors include: advanced age (>71men, >48 women);female gender, Hypertension, and Hypercholesterolemia.           Objective:        11/10/2022    1:07 PM 10/17/2022   11:28 AM 10/17/2022   11:08 AM  Vitals with BMI  Height   4\' 11"   Weight   183 lbs 6 oz  BMI   37.02  Systolic 135 136 575  Diastolic 81 84 87  Pulse 103  97    Today's Vitals   11/18/22 1106  PainSc: 10-Worst pain ever   There is no height or weight on file to calculate BMI.     11/18/2022   11:09 AM 10/17/2022   11:09 AM 09/24/2022    9:58 AM 08/18/2022    8:53 AM 06/12/2022   11:40 AM 03/13/2022   11:25 AM 12/06/2021   12:05 PM  Advanced Directives  Does Patient Have a Medical Advance Directive? Yes No  Yes No No No  Type of Estate agent of [employer] Power of [place];Living will Healthcare Power of [place];Living will Healthcare Power of [place];Living will     Does patient want to make changes to medical advance directive? Yes (MAU/Ambulatory/Procedural Areas - Information given)        Copy of Healthcare Power of Attorney in Chart?   No - copy requested No - copy requested     Would patient like information on creating a medical advance directive?  No - Patient declined   No - Patient declined No - Patient declined     Current Medications (verified) Outpatient Encounter Medications as of 11/18/2022  Medication Sig   acetic  acid-hydrocortisone (VOSOL-HC) OTIC solution Place 3 drops into both ears 2 (two) times daily.   Artificial Tear Ointment (DRY EYES OP) Apply 1 drop to eye 2 (two) times daily as needed (dry eyes).   baclofen (LIORESAL) 20 MG tablet TAKE ONE TABLET BY MOUTH THREE TIMES A DAY   busPIRone (BUSPAR) 5 MG tablet Take 1 tablet (5 mg total) by mouth 3 (three) times daily.   Clobetasol Prop Emollient Base (CLOBETASOL PROPIONATE E) 0.05 % emollient cream Apply 2 Applications topically at bedtime.   DULoxetine (CYMBALTA) 30 MG capsule TAKE ONE CAPSULE BY MOUTH DAILY   enalapril (VASOTEC) 10 MG tablet TAKE ONE TABLET BY MOUTH DAILY   estradiol (ESTRACE) 0.1 MG/GM vaginal cream Place 0.5g nightly for two weeks then twice a week after   glucose blood (ONETOUCH VERIO) test strip USE  STRIP TO CHECK GLUCOSE TWICE DAILY AS DIRECTED. E11.9   metoprolol succinate (TOPROL-XL) 25 MG 24 hr tablet TAKE ONE TABLET BY MOUTH DAILY   mirabegron ER (MYRBETRIQ) 50 MG TB24 tablet Take 1 tablet (50 mg total) by mouth daily.   Multiple Vitamin (MULTIVITAMIN WITH MINERALS) TABS tablet Take 1 tablet by mouth daily.   ondansetron (ZOFRAN) 4 MG tablet Take 1 tablet (4 mg total) by mouth every 8 (  eight) hours as needed for nausea or vomiting.   oxyCODONE-acetaminophen (PERCOCET/ROXICET) 5-325 MG tablet Take 0.5 tablets by mouth every 6 (six) hours as needed for severe pain.   pantoprazole (PROTONIX) 40 MG tablet TAKE ONE TABLET BY MOUTH DAILY   rosuvastatin (CRESTOR) 20 MG tablet Take 1 tablet (20 mg total) by mouth daily.   Semaglutide, 1 MG/DOSE, (OZEMPIC, 1 MG/DOSE,) 2 MG/1.5ML SOPN Inject 2 mg into the skin once a week. (Patient taking differently: Inject 1 mg into the skin every Sunday.)   triamcinolone ointment (KENALOG) 0.5 % Apply 1 application topically 2 (two) times daily. For elbows (Patient taking differently: Apply 1 application  topically 2 (two) times daily as needed (elbow itching).)   famotidine (PEPCID) 40 MG tablet  Take 1 tablet (40 mg total) by mouth daily. (Patient not taking: Reported on 11/18/2022)   fexofenadine (ALLEGRA) 180 MG tablet Take 1 tablet (180 mg total) by mouth daily. (Patient not taking: Reported on 11/18/2022)   Lancets (ONETOUCH DELICA PLUS LANCET33G) MISC USE TO CHECK GLUCOSE IN THE MORNING AND AT BEDTIME. E11.9 (Patient not taking: Reported on 11/18/2022)   trospium (SANCTURA) 20 MG tablet TAKE 1 TABLET BY MOUTH 2 TIMES A DAY (Patient not taking: Reported on 11/18/2022)   No facility-administered encounter medications on file as of 11/18/2022.    Allergies (verified) Amoxicillin, Azithromycin, Butalbital, Clindamycin hcl, Januvia [sitagliptin], Levaquin [levofloxacin in d5w], Meperidine hcl, Sulfamethoxazole, Fexofenadine hcl, Ketorolac, Macrodantin [nitrofurantoin macrocrystal], Methocarbamol, Tramadol, Codeine phosphate, Etodolac, Gabapentin, Naproxen, Pentazocine-naloxone hcl, Sumatriptan, Trimethoprim, and Zonegran   History: Past Medical History:  Diagnosis Date   Asthma    Depression    Diabetes mellitus without complication    History of surgery on arm    right arm ( plate and screws)   Hypertension    Macular degeneration    Migraine    without aura   Pneumonia    Urinary incontinence    Past Surgical History:  Procedure Laterality Date   ABDOMINAL HYSTERECTOMY     ?Lt. ovary remains   APPENDECTOMY     Arm surgery     BREAST BIOPSY Right 10/10/2009   BREAST BIOPSY Right 09/17/2022   MM RT BREAST BX W LOC DEV 1ST LESION IMAGE BX SPEC STEREO GUIDE 09/17/2022 [facility]   BREAST CYST ASPIRATION  12/20/2013   BREAST EXCISIONAL BIOPSY Left 1998   BREAST EXCISIONAL BIOPSY Right 1993   ESOPHAGOGASTRODUODENOSCOPY N/A 04/17/2021   Procedure: ESOPHAGOGASTRODUODENOSCOPY (EGD);  Surgeon: Charna Elizabeth, MD;  Location: [facility] ENDOSCOPY;  Service: Endoscopy;  Laterality: N/A;  Rectal bleeding and anemia.   ESOPHAGOGASTRODUODENOSCOPY N/A 04/18/2021   Procedure: ESOPHAGOGASTRODUODENOSCOPY  (EGD);  Surgeon: Jeani Hawking, MD;  Location: [facility] ENDOSCOPY;  Service: Endoscopy;  Laterality: N/A;   HOT HEMOSTASIS N/A 04/18/2021   Procedure: HOT HEMOSTASIS (ARGON PLASMA COAGULATION/BICAP);  Surgeon: Jeani Hawking, MD;  Location[facility] END[facility]COPY;  Service: Endoscopy;  Laterality: N/A;   IR ANGIOGRAM SELECTIVE EACH ADDITIONAL VESSEL  04/17/2021   IR ANGIOGRAM VISCERAL SELECTIVE  04/17/2021   IR EMBO ART  VEN HEMORR LYMPH EXTRAV  INC GUIDE ROADMAPPING  04/17/2021   IR US GUIDE VASC ACCESS RIGHT  04/17/2021   RADIOLOGY WITH ANESTHESIA N/A 04/17/2021   Procedure: RADIOLOGY WITH ANESTHESIA;  Surgeon: Radiologist, Medication, MD;  Location: [facility] OR;  Service: Radiology;  Laterality: N/A;   SUBMUCOSAL INJECTION  04/18/2021   Procedure: SUBMUCOSAL INJECTION;  Surgeon: Jeani Hawking, MD;  Location: [facility] ENDOSCOPY;  Service: Endoscopy;;   Family History  Problem Relation Age  of Onset   Hypertension Mother    Hypertension Father    Hearing loss Sister    Heart attack Sister 61       s/p 4 bypasses    Stroke Brother        19   Breast cancer Maternal Aunt    Ovarian cancer Maternal Aunt    Social History   Socioeconomic History   Marital status: Widowed    Spouse name: Not on file   Number of children: 2   Years of education: Not on file   Highest education level: Not on file  Occupational History   Occupation: Retired  Tobacco Use   Smoking status: Former    Packs/day: 2.00    Years: 33.00    Additional pack years: 0.00    Total pack years: 66.00    Types: Cigarettes    Start date: 08/11/1957    Quit date: 04/04/1991    Years since quitting: 31.6   Smokeless tobacco: Never  Vaping Use   Vaping Use: Never used  Substance and Sexual Activity   Alcohol use: No   Drug use: No   Sexual activity: Not on file  Other Topics Concern   Not on file  Social History Narrative   Not on file   Social Determinants of Health   Financial Resource Strain: Low Risk  (11/18/2022)   Overall Financial Resource  Strain (CARDIA)    Difficulty of Paying Living Expenses: Not hard at all  Food Insecurity: No Food Insecurity (11/18/2022)   Hunger Vital Sign    Worried About Running Out of Food in the Last Year: Never true    Ran Out of Food in the Last Year: Never true  Transportation Needs: No Transportation Needs (11/18/2022)   PRAPARE - Administrator, Civil Service (Medical): No    Lack of Transportation (Non-Medical): No  Physical Activity: Inactive (11/18/2022)   Exercise Vital Sign    Days of Exercise per Week: 0 days    Minutes of Exercise per Session: 0 min  Stress: No Stress Concern Present (11/18/2022)   [employer] of Occupational Health - Occupational Stress Questionnaire    Feeling of Stress : Not at all  Social Connections: Socially Isolated (11/18/2022)   Social Connection and Isolation Panel [NHANES]    Frequency of Communication with Friends and Family: More than three times a week    Frequency of Social Gatherings with Friends and Family: Twice a week    Attends Religious Services: Never    Database administrator or Organizations: No    Attends Banker Meetings: Never    Marital Status: Widowed    Tobacco Counseling Counseling given: No   Clinical Intake:  Pre-visit preparation completed: No  Pain : 0-10 Pain Score: 10-Worst pain ever Pain Type: Chronic pain Pain Location: Back Pain Orientation: Lower Pain Descriptors / Indicators: Aching, Constant Pain Onset: More than a month ago Pain Frequency: Constant     Nutritional Status: BMI 25 -29 Overweight Nutritional Risks: None Diabetes: Yes CBG done?: Yes CBG resulted in Enter/ Edit results?: No Did pt. bring in CBG monitor from home?: No  How often do you need to have someone help you when you read instructions, pamphlets, or other written materials from your doctor or pharmacy?: 1 - Never  Diabetic?Nutrition Risk Assessment:  Has the patient had any N/V/D within the last 2 months?  No   Does the patient have any non-healing wounds?  No  Has the patient had any unintentional weight loss or weight gain?  No   Diabetes:  Is the patient diabetic?  Yes  If diabetic, was a CBG obtained today?  Yes  Did the patient bring in their glucometer from home?  No  How often do you monitor your CBG's? Daily .   Financial Strains and Diabetes Management:  Are you having any financial strains with the device, your supplies or your medication? No .  Does the patient want to be seen by Chronic Care Management for management of their diabetes?  No  Would the patient like to be referred to a Nutritionist or for Diabetic Management?  No   Diabetic Exams:  Diabetic Eye Exam: Completed 09/01/2022 Diabetic Foot Exam: Overdue, Pt has been advised about the importance in completing this exam. Pt is scheduled for diabetic foot exam on the next diabetic appointment.   Interpreter Needed?: No  Information entered by :: Laurel Dimmer, CMA   Activities of Daily Living    11/18/2022   11:02 AM  In your present state of health, do you have any difficulty performing the following activities:  Hearing? 1  Vision? 1  Comment ocular degenertion, Dr. Velna Ochs  Difficulty concentrating or making decisions? 0  Walking or climbing stairs? 1  Dressing or bathing? 0  Doing errands, shopping? 1    Patient Care Team: Latrelle Dodrill, MD as PCP - General (Family Medicine)  Indicate any recent Medical Services you may have received from other than [facility] providers in the past year (date may be approximate).     Assessment:   This is a routine wellness examination for [name].   Hearing/Vision screen The pt admits to some hearing loss that she associate with age.  Denies any change to her vision. Annual Eye Exam.   Dietary issues and exercise activities discussed: Current Exercise Habits: The patient does not participate in regular exercise at present, Exercise limited by: orthopedic  condition(s)   Goals Addressed             This Visit's Progress    Stay Active and Independent-Low Back Pain       Why is this important?   Regular activity or exercise is important to managing back pain.  Activity helps to keep your muscles strong.  You will sleep better and feel more relaxed.  You will have more energy and feel less stressed.  If you are not active now, start slowly. Little changes make a big difference.  Rest, but not too much.  Stay as active as you can and listen to your body's signals.           Depression Screen    11/18/2022   11:02 AM 10/17/2022   11:09 AM 09/24/2022    9:58 AM 08/18/2022    8:51 AM 06/12/2022   11:40 AM 03/13/2022   11:25 AM 12/06/2021   11:06 AM  PHQ 2/9 Scores  PHQ - 2 Score 0 0 0 2 0 0 0  PHQ- 9 Score  2 0 7 3 0 5    Fall Risk    11/18/2022   11:02 AM 10/17/2022   11:09 AM 03/13/2022   11:25 AM 04/04/2021    8:59 AM 01/30/2021    1:32 PM  Fall Risk   Falls in the past year? 0 0 0 1 1  Number falls in past yr: 0 0 0 1 0  Comment    last was 03/31/21 tripped over a  low step   Injury with Fall? 0 0 0 1 0  Comment    bruised knees   Risk for fall due to : No Fall Risks   History of fall(s);Impaired balance/gait   Follow up Falls evaluation completed  Falls evaluation completed  Falls evaluation completed;Education provided    FALL RISK PREVENTION PERTAINING TO THE HOME:  Any stairs in or around the home? No  If so, are there any without handrails? No  Home free of loose throw rugs in walkways, pet beds, electrical cords, etc? Yes  Adequate lighting in your home to reduce risk of falls? Yes   ASSISTIVE DEVICES UTILIZED TO PREVENT FALLS:  Life alert? Yes  Use of a cane, walker or w/c? No  Grab bars in the bathroom? No  Shower chair or bench in shower? No  Elevated toilet seat or a handicapped toilet? Yes   TIMED UP AND GO:  Was the test performed?Unable to perform, virtual appointment   Cognitive Function:         11/18/2022   11:08 AM  6CIT Screen  What Year? 0 points  What month? 0 points  What time? 0 points  Count back from 20 0 points  Months in reverse 0 points  Repeat phrase 2 points  Total Score 2 points    Immunizations Immunization History  Administered Date(s) Administered   Fluad Quad(high Dose 65+) 04/22/2019, 05/22/2021, 06/12/2022   Hepatitis A 12/15/2012   Influenza Split 06/11/2011, 04/26/2012   Influenza Whole 05/24/2007, 05/19/2008, 05/25/2009, 05/22/2010   Influenza,inj,Quad PF,6+ Mos 04/27/2013, 05/17/2014, 05/03/2015, 04/28/2018   Influenza-Unspecified 05/22/2017, 05/11/2020   PFIZER Comirnaty(Gray Top)Covid-19 Tri-Sucrose Vaccine 01/14/2021   PFIZER(Purple Top)SARS-COV-2 Vaccination 09/01/2019, 09/22/2019   Pfizer Covid-19 Vaccine Bivalent Booster 5966yrs & up 07/11/2021   Pneumococcal Conjugate-13 05/17/2014   Pneumococcal Polysaccharide-23 07/12/1999, 04/26/2007   Td 11/09/1997, 11/12/2007   Zoster, Live 06/07/2007    TDAP status: Up to date  Flu Vaccine status: Up to date  Pneumococcal vaccine status: Due, Education has been provided regarding the importance of this vaccine. Advised may receive this vaccine at local pharmacy or Health Dept. Aware to provide a copy of the vaccination record if obtained from local pharmacy or Health Dept. Verbalized acceptance and understanding.  Covid-19 vaccine status: Information provided on how to obtain vaccines.   Qualifies for Shingles Vaccine? Yes   Zostavax completed Yes   Shingrix Completed?: Yes  Screening Tests Health Maintenance  Topic Date Due   Zoster Vaccines- Shingrix (1 of 2) Never done   DTaP/Tdap/Td (3 - Tdap) 11/11/2017   Pneumonia Vaccine 7265+ Years old (3 of 3 - PPSV23 or PCV20) 05/18/2019   FOOT EXAM  01/20/2020   COVID-19 Vaccine (5 - 2023-24 season) 04/11/2022   INFLUENZA VACCINE  03/12/2023   HEMOGLOBIN A1C  03/25/2023   Diabetic kidney evaluation - eGFR measurement  08/19/2023   Diabetic kidney  evaluation - Urine ACR  08/19/2023   OPHTHALMOLOGY EXAM  09/02/2023   Medicare Annual Wellness (AWV)  11/18/2023   DEXA SCAN  Completed   HPV VACCINES  Aged Out    Health Maintenance  Health Maintenance Due  Topic Date Due   Zoster Vaccines- Shingrix (1 of 2) Never done   DTaP/Tdap/Td (3 - Tdap) 11/11/2017   Pneumonia Vaccine 7565+ Years old (3 of 3 - PPSV23 or PCV20) 05/18/2019   FOOT EXAM  01/20/2020   COVID-19 Vaccine (5 - 2023-24 season) 04/11/2022    Colorectal cancer screening: No longer required.  Mammogram status: No longer required due to age.   DEXA Scan: 05/21/2012  Lung Cancer Screening: (Low Dose CT Chest recommended if Age 2-80 years, 30 pack-year currently smoking OR have quit w/in 15years.) does not qualify.   Lung Cancer Screening Referral: not appliable   Additional Screening:  Hepatitis C Screening: does not qualify;  Vision Screening: Recommended annual ophthalmology exams for early detection of glaucoma and other disorders of the eye. Is the patient up to date with their annual eye exam?  Yes  Who is the provider or what is the name of the office in which the patient attends annual eye exams? Dr. Velna Ochs  If pt is not established with a provider, would they like to be referred to a provider to establish care? No .   Dental Screening: Recommended annual dental exams for proper oral hygiene  Community Resource Referral / Chronic Care Management: CRR required this visit?  No   CCM required this visit?  No      Plan:     I have personally reviewed and noted the following in the patient's chart:   Medical and social history Use of alcohol, tobacco or illicit drugs  Current medications and supplements including opioid prescriptions. The pt currently take opioid prescription.  Functional ability and status Nutritional status Physical activity Advanced directives List of other physicians Hospitalizations, surgeries, and ER visits in previous  12 months Vitals Screenings to include cognitive, depression, and falls Referrals and appointments  In addition, I have reviewed and discussed with patient certain preventive protocols, quality metrics, and best practice recommendations. A written personalized care plan for preventive services as well as general preventive health recommendations were provided to patient.    [name] , Thank you for taking time to come for your Medicare Wellness Visit. I appreciate your ongoing commitment to your health goals. Please review the following plan we discussed and let me know if I can assist you in the future.   These are the goals we discussed:  Goals      Stay Active and Independent-Low Back Pain     Why is this important?   Regular activity or exercise is important to managing back pain.  Activity helps to keep your muscles strong.  You will sleep better and feel more relaxed.  You will have more energy and feel less stressed.  If you are not active now, start slowly. Little changes make a big difference.  Rest, but not too much.  Stay as active as you can and listen to your body's signals.            This is a list of the screening recommended for you and due dates:  Health Maintenance  Topic Date Due   Zoster (Shingles) Vaccine (1 of 2) Never done   DTaP/Tdap/Td vaccine (3 - Tdap) 11/11/2017   Pneumonia Vaccine (3 of 3 - PPSV23 or PCV20) 05/18/2019   Complete foot exam   01/20/2020   COVID-19 Vaccine (5 - 2023-24 season) 04/11/2022   Flu Shot  03/12/2023   Hemoglobin A1C  03/25/2023   Yearly kidney function blood test for diabetes  08/19/2023   Yearly kidney health urinalysis for diabetes  08/19/2023   Eye exam for diabetics  09/02/2023   Medicare Annual Wellness Visit  11/18/2023   DEXA scan (bone density measurement)  Completed   HPV Vaccine  Aged [address] [place], [place]   11/18/2022  Nurse Notes: Approximately 30 minute  Non-Face -To-Face Medicare Wellness  Visit

## 2022-11-18 ENCOUNTER — Other Ambulatory Visit: Payer: Self-pay

## 2022-11-18 ENCOUNTER — Telehealth: Payer: Self-pay

## 2022-11-18 ENCOUNTER — Ambulatory Visit (INDEPENDENT_AMBULATORY_CARE_PROVIDER_SITE_OTHER): Payer: PPO

## 2022-11-18 DIAGNOSIS — Z0000 Encounter for general adult medical examination without abnormal findings: Secondary | ICD-10-CM | POA: Diagnosis not present

## 2022-11-18 DIAGNOSIS — G8929 Other chronic pain: Secondary | ICD-10-CM

## 2022-11-18 NOTE — Telephone Encounter (Signed)
Patient calls nurse line requesting an advance directive packet be mailed to her home.   Verified address.  Advance directive mailed to patient.

## 2022-11-20 ENCOUNTER — Telehealth: Payer: Self-pay | Admitting: Physician Assistant

## 2022-11-20 DIAGNOSIS — G8929 Other chronic pain: Secondary | ICD-10-CM

## 2022-11-20 NOTE — Telephone Encounter (Signed)
Mri ordered. Pt was called and advised 

## 2022-11-20 NOTE — Telephone Encounter (Signed)
Patient  states she's having a lot of pain getting in and out of bed and would like to go ahead with having an MRI, request [facility] on [address].

## 2022-12-01 ENCOUNTER — Ambulatory Visit: Payer: PPO | Admitting: Family Medicine

## 2022-12-04 ENCOUNTER — Other Ambulatory Visit: Payer: Self-pay

## 2022-12-04 DIAGNOSIS — I10 Essential (primary) hypertension: Secondary | ICD-10-CM

## 2022-12-05 MED ORDER — ENALAPRIL MALEATE 10 MG PO TABS: 10.0000 mg | ORAL_TABLET | Freq: Every day | ORAL | 0 refills | Status: DC

## 2022-12-08 ENCOUNTER — Other Ambulatory Visit: Payer: Self-pay

## 2022-12-08 DIAGNOSIS — I10 Essential (primary) hypertension: Secondary | ICD-10-CM

## 2022-12-09 ENCOUNTER — Ambulatory Visit: Payer: PPO | Admitting: Family Medicine

## 2022-12-09 DIAGNOSIS — I10 Essential (primary) hypertension: Secondary | ICD-10-CM | POA: Diagnosis not present

## 2022-12-09 DIAGNOSIS — Z6835 Body mass index (BMI) 35.0-35.9, adult: Secondary | ICD-10-CM | POA: Diagnosis not present

## 2022-12-09 DIAGNOSIS — E119 Type 2 diabetes mellitus without complications: Secondary | ICD-10-CM | POA: Diagnosis not present

## 2022-12-09 DIAGNOSIS — Z7985 Long-term (current) use of injectable non-insulin antidiabetic drugs: Secondary | ICD-10-CM | POA: Diagnosis not present

## 2022-12-11 ENCOUNTER — Ambulatory Visit: Payer: PPO | Admitting: Obstetrics and Gynecology

## 2022-12-12 ENCOUNTER — Ambulatory Visit: Payer: PPO | Admitting: Family Medicine

## 2022-12-12 MED ORDER — ENALAPRIL MALEATE 10 MG PO TABS: 10.0000 mg | ORAL_TABLET | Freq: Every day | ORAL | 0 refills | Status: DC

## 2022-12-14 ENCOUNTER — Other Ambulatory Visit: Payer: PPO

## 2022-12-15 ENCOUNTER — Ambulatory Visit: Payer: PPO | Admitting: Physician Assistant

## 2022-12-18 ENCOUNTER — Other Ambulatory Visit: Payer: Self-pay | Admitting: *Deleted

## 2022-12-18 ENCOUNTER — Ambulatory Visit: Payer: PPO | Admitting: Physician Assistant

## 2022-12-18 DIAGNOSIS — E119 Type 2 diabetes mellitus without complications: Secondary | ICD-10-CM

## 2022-12-19 ENCOUNTER — Other Ambulatory Visit: Payer: Self-pay

## 2022-12-19 DIAGNOSIS — M159 Polyosteoarthritis, unspecified: Secondary | ICD-10-CM

## 2022-12-19 DIAGNOSIS — G8929 Other chronic pain: Secondary | ICD-10-CM

## 2022-12-19 MED ORDER — ONETOUCH VERIO VI STRP: ORAL_STRIP | 3 refills | Status: DC

## 2022-12-19 MED ORDER — OXYCODONE-ACETAMINOPHEN 5-325 MG PO TABS: 0.5000 | ORAL_TABLET | Freq: Four times a day (QID) | ORAL | 0 refills | Status: DC | PRN

## 2022-12-19 NOTE — Telephone Encounter (Signed)
PDMP reviewed, appropriate Patient will need to schedule with new PCP Dr. Linwood Dibbles once she is available in Epic.  Latrelle Dodrill, MD

## 2022-12-21 NOTE — Progress Notes (Signed)
    SUBJECTIVE:   CHIEF COMPLAINT / HPI:   [name] is a 82 y.o. female who presents to the [facility] clinic today to discuss the following concerns:   Mouth Lesions States that the inside of her lips and the roof of her mouth have been peeling for the last 1.5 months. It occurs "all the time". Skin goes "in between my teeth". It is not painful. Her lips are not bleeding. Has not tried anything for this.   Hypertension Per Pharmacy records, has not picked up Enalapril since 2023. She endorses still taking this medication, however, does not seem to know her medications very well. Would benefit from bringing in all of her medications.   PERTINENT  PMH / PSH: T2DM, chronic pain, HTN   OBJECTIVE:   BP 132/70   Pulse 93   Ht 4\' 11"  (1.499 m)   Wt 182 lb (82.6 kg)   SpO2 95%   BMI 36.76 kg/m    General: NAD, pleasant, able to participate in exam HEENT: Normal mucous membranes without evidence of peeling to roof of mouth. There was one small dry spot to central lower lip that was peeling but without bleeding or other abnormalities. Does have small violaceous areas to mucous membranes, ?vasculature  Cardiac: RRR, no murmurs. Respiratory: CTAB, normal effort, No wheezes, rales or rhonchi Psych: Normal affect and mood       ASSESSMENT/PLAN:   1. Oral mucosal lesion Ongoing x1.5 months but no clear evidence of this today. Discussed that given her smoking history (66 pack year, former smoker) it would be important to keep an eye on this. Would recommend f/u in 1 month. In the meantime, recommend that she take pictures of her flares. Can try oral mouth washes/rinses. Would try to keep lips hydrated and to stay hydrated. Could also be due to vitamin deficiencies- recommend she continue multivitamins.      Sabino Dick, DO [facility]

## 2022-12-22 ENCOUNTER — Encounter: Payer: Self-pay | Admitting: Family Medicine

## 2022-12-22 ENCOUNTER — Ambulatory Visit (INDEPENDENT_AMBULATORY_CARE_PROVIDER_SITE_OTHER): Payer: PPO | Admitting: Family Medicine

## 2022-12-22 VITALS — BP 132/70 | HR 93 | Ht 59.0 in | Wt 182.0 lb

## 2022-12-22 DIAGNOSIS — K1370 Unspecified lesions of oral mucosa: Secondary | ICD-10-CM | POA: Diagnosis not present

## 2022-12-22 MED ORDER — BENZONATATE 100 MG PO CAPS: 200.0000 mg | ORAL_CAPSULE | Freq: Three times a day (TID) | ORAL | 0 refills | Status: DC | PRN

## 2022-12-22 NOTE — Patient Instructions (Signed)
It was wonderful to see you today.  Please bring ALL of your medications with you to every visit.   Today we talked about:  Return in 1 month for follow up.  Try an over the counter lip mask. Stay hydrated. Continue your multivitamins. You can try mouth rinses or salt water rinses. Bring your medications to your next appointment. Take pictures of any flares that occur so that we can see them at your next follow up.   Thank you for coming to your visit as scheduled. We have had a large "no-show" problem lately, and this significantly limits our ability to see and care for patients. As a friendly reminder- if you cannot make your appointment please call to cancel. We do have a no show policy for those who do not cancel within 24 hours. Our policy is that if you miss or fail to cancel an appointment within 24 hours, 3 times in a 28-month period, you may be dismissed from our clinic.   Thank you for choosing [facility].   Please call [phone] with any questions about today's appointment.  Please be sure to schedule follow up at the front  desk before you leave today.   Sabino Dick, DO PGY-3 Family Medicine

## 2023-01-19 ENCOUNTER — Other Ambulatory Visit: Payer: Self-pay

## 2023-01-19 MED ORDER — TROSPIUM CHLORIDE 20 MG PO TABS: 20.0000 mg | ORAL_TABLET | Freq: Two times a day (BID) | ORAL | 1 refills | Status: DC

## 2023-01-21 ENCOUNTER — Ambulatory Visit
Admission: RE | Admit: 2023-01-21 | Discharge: 2023-01-21 | Disposition: A | Discharge status: Home / Self Care | Payer: PPO | Source: Ambulatory Visit | Attending: Physician Assistant | Admitting: Physician Assistant

## 2023-01-21 DIAGNOSIS — M545 Low back pain, unspecified: Secondary | ICD-10-CM

## 2023-01-21 DIAGNOSIS — M48061 Spinal stenosis, lumbar region without neurogenic claudication: Secondary | ICD-10-CM | POA: Diagnosis not present

## 2023-01-22 ENCOUNTER — Other Ambulatory Visit: Payer: Self-pay

## 2023-01-22 DIAGNOSIS — M159 Polyosteoarthritis, unspecified: Secondary | ICD-10-CM

## 2023-01-22 DIAGNOSIS — G8929 Other chronic pain: Secondary | ICD-10-CM

## 2023-01-22 MED ORDER — OXYCODONE-ACETAMINOPHEN 5-325 MG PO TABS: 0.5000 | ORAL_TABLET | Freq: Four times a day (QID) | ORAL | 0 refills | Status: DC | PRN

## 2023-01-29 ENCOUNTER — Ambulatory Visit: Payer: PPO | Admitting: Family Medicine

## 2023-01-29 ENCOUNTER — Telehealth: Payer: Self-pay

## 2023-01-29 NOTE — Telephone Encounter (Signed)
Patient [name] on nurse line in regards Ozempic.  She reports she has not heard anything for her upcoming shipment. She reports she is down to her last supply. Last shipment was picked up in March.   Will forward to [place] for assistance.

## 2023-02-02 ENCOUNTER — Ambulatory Visit: Payer: PPO | Admitting: Physician Assistant

## 2023-02-02 ENCOUNTER — Ambulatory Visit: Payer: PPO | Admitting: Family Medicine

## 2023-02-02 ENCOUNTER — Encounter: Payer: Self-pay | Admitting: Physician Assistant

## 2023-02-02 DIAGNOSIS — M545 Low back pain, unspecified: Secondary | ICD-10-CM | POA: Diagnosis not present

## 2023-02-02 DIAGNOSIS — G8929 Other chronic pain: Secondary | ICD-10-CM

## 2023-02-03 ENCOUNTER — Other Ambulatory Visit: Payer: Self-pay

## 2023-02-03 DIAGNOSIS — E119 Type 2 diabetes mellitus without complications: Secondary | ICD-10-CM

## 2023-02-03 NOTE — Telephone Encounter (Signed)
Called to inform patient her ozempic medication was delivered today and ready for pickup.  4 boxes of ozempic 1mg  dose pens are labeled and ready in med room.

## 2023-02-04 NOTE — Telephone Encounter (Signed)
Shipment given to patient.  

## 2023-02-06 MED ORDER — ONETOUCH DELICA PLUS LANCET33G MISC: 3 refills | Status: DC

## 2023-02-09 ENCOUNTER — Ambulatory Visit: Payer: PPO | Admitting: Obstetrics and Gynecology

## 2023-02-13 ENCOUNTER — Other Ambulatory Visit: Payer: Self-pay | Admitting: *Deleted

## 2023-02-13 DIAGNOSIS — I700 Atherosclerosis of aorta: Secondary | ICD-10-CM

## 2023-02-17 ENCOUNTER — Ambulatory Visit: Payer: PPO | Admitting: Obstetrics and Gynecology

## 2023-02-20 ENCOUNTER — Other Ambulatory Visit: Payer: PPO

## 2023-02-23 ENCOUNTER — Ambulatory Visit: Payer: PPO | Admitting: Obstetrics and Gynecology

## 2023-02-23 ENCOUNTER — Encounter: Payer: Self-pay | Admitting: Obstetrics and Gynecology

## 2023-02-23 VITALS — BP 109/70 | HR 79

## 2023-02-23 DIAGNOSIS — N952 Postmenopausal atrophic vaginitis: Secondary | ICD-10-CM

## 2023-02-23 DIAGNOSIS — R350 Frequency of micturition: Secondary | ICD-10-CM | POA: Diagnosis not present

## 2023-02-23 DIAGNOSIS — L292 Pruritus vulvae: Secondary | ICD-10-CM | POA: Diagnosis not present

## 2023-02-23 LAB — POCT URINALYSIS DIPSTICK
Bilirubin, UA: NEGATIVE
Glucose, UA: NEGATIVE
Ketones, UA: NEGATIVE
Leukocytes, UA: NEGATIVE
Nitrite, UA: NEGATIVE
Protein, UA: NEGATIVE
Spec Grav, UA: 1.025 (ref 1.010–1.025)
Urobilinogen, UA: 0.2 E.U./dL
pH, UA: 6 (ref 5.0–8.0)

## 2023-02-23 MED ORDER — D-MANNOSE 500 MG PO CAPS: 1.0000 | ORAL_CAPSULE | Freq: Every day | ORAL | 5 refills | Status: DC

## 2023-02-23 NOTE — Patient Instructions (Signed)
You do not have a UTI showing today when we checked.   You should use the vaginal estrogen cream every night for the next week and then after that twice a week.   Continue to use the clobetasol cream 3 times weekly.   The D-mannose can be helpful in prevention of UTI.   We will see you in a few weeks for your botox.

## 2023-02-23 NOTE — Progress Notes (Signed)
[name] Return Visit  SUBJECTIVE  History of Present Illness: [name] is a 82 y.o. female seen in follow-up for Lichen's Sclerosus and Vaginal atrophy. Plan at last visit was to use the clobetasol x3 weekly and the vaginal estrogen twice a week.  Patient reports she has a burning today when she urinates and would like a urine checked.   Patient reports she has not been using the vaginal estrogen cream.   Past Medical History: Patient  has a past medical history of Asthma, Depression, Diabetes mellitus without complication (HCC), History of surgery on arm, Hypertension, Macular degeneration, Migraine, Pneumonia, and Urinary incontinence.   Past Surgical History: She  has a past surgical history that includes Appendectomy; Arm surgery; Breast cyst aspiration (12/20/2013); Breast biopsy (Right, 10/10/2009); Breast excisional biopsy (Left, 1998); Breast excisional biopsy (Right, 1993); Abdominal hysterectomy; IR US Guide Vasc Access Right (04/17/2021); IR Angiogram Visceral Selective (04/17/2021); IR EMBO ART  VEN HEMORR LYMPH EXTRAV  INC GUIDE ROADMAPPING (04/17/2021); IR Angiogram Selective Each Additional Vessel (04/17/2021); Radiology with anesthesia (N/A, 04/17/2021); Esophagogastroduodenoscopy (N/A, 04/17/2021); Esophagogastroduodenoscopy (N/A, 04/18/2021); submucosal injection (04/18/2021); Hot hemostasis (N/A, 04/18/2021); and Breast biopsy (Right, 09/17/2022).   Medications: She has a current medication list which includes the following prescription(s): acetic acid-hydrocortisone, artificial tear ointment, baclofen, benzonatate, buspirone, clobetasol prop emollient base, d-mannose, duloxetine, enalapril, estradiol, famotidine, fexofenadine, onetouch verio, onetouch delica plus lancet33g, metoprolol succinate, multivitamin with minerals, ondansetron, oxycodone-acetaminophen, pantoprazole, rosuvastatin, ozempic (1 mg/dose), triamcinolone ointment, and trospium.   Allergies: Patient is  allergic to amoxicillin, azithromycin, butalbital, clindamycin hcl, januvia [sitagliptin], levaquin [levofloxacin in d5w], meperidine hcl, sulfamethoxazole, fexofenadine hcl, ketorolac, macrodantin [nitrofurantoin macrocrystal], methocarbamol, tramadol, codeine phosphate, etodolac, gabapentin, naproxen, pentazocine-naloxone hcl, sumatriptan, trimethoprim, and zonegran.   Social History: Patient  reports that she quit smoking about 31 years ago. Her smoking use included cigarettes. She started smoking about 65 years ago. She has a 67.3 pack-year smoking history. She has never used smokeless tobacco. She reports that she does not drink alcohol and does not use drugs.      OBJECTIVE    POC Urine: Negative for leukocytes, positive for trace intact blood.   Physical Exam: Vitals:   02/23/23 1458  BP: 109/70  Pulse: 79   Gen: No apparent distress, A&O x 3.  Detailed Urogynecologic Evaluation:   Vaginal exam shows atrophic irritation on the labial folds. Areas of lichens sclerosus have decreased and skin is healing well. No signs of bleeding, mass, or other areas of concern on speculum exam.    ASSESSMENT AND PLAN    [name] is a 82 y.o. with:  1. Urinary frequency   2. Vaginal atrophy   3. Vulvar itching    Patient is planning to undergo bladder botox again in August. She reports she is currently wearing a pad with an adult brief otherwise she will leak through. POC urine negative for signs of infection today.  Patient had not been using the vaginal estrogen cream and has prominent atrophic irritation at the labial region. Encouraged her to use the estrogen every night for the next week and then resume using it twice weekly for prevention of UTI and using good skin hygiene to prevent transmission of e.coli.  Patient reports her vulvar itching has vastly improved and will continue the clobetasol cream three times weekly.  Patient to return for vaginal botox with Dr. Florian Buff.

## 2023-02-27 ENCOUNTER — Other Ambulatory Visit: Payer: Self-pay

## 2023-02-27 DIAGNOSIS — G8929 Other chronic pain: Secondary | ICD-10-CM

## 2023-02-27 DIAGNOSIS — M159 Polyosteoarthritis, unspecified: Secondary | ICD-10-CM

## 2023-03-01 NOTE — Progress Notes (Unsigned)
[facility] [place]  ASSESSMENT / PLAN: [name] is a 82 y.o. female with a infrarenal abdominal aortic aneurysm measuring 34mm.  The Joint Council of the American Association for Vascular Surgery and Society for Vascular Surgery estimates annual rupture risk based on abdominal aneurysm diameter. The patient's estimated risk is {aneurysmrupture:24841}  The patient is *** a candidate for elective repair of the aneurysm to prevent rupture.  I explained the risks / benefits / alternatives to different approaches to aortic reconstruction.   I explained the specific benefits of open repair including improved durability, limited requirement for surveillance, less need for secondary intervention. I explained the specific risks from open repair including higher physiologic stress from aortic cross clamping, higher risk of perioperative complication (including stroke, MI, pneumonia, renal insufficiency and failure, etc.), higher risk of abdominal wall complications, risk of anastomotic pseudoaneurysm.  I explained the specific benefits of endovascular repair including limited physiologic stress and less recovery time, lower risk of serious perioperative complication, zero risk of abdominal wall complication.  I explained the specific risks from endovascular repair including need for lifetime surveillance, risk of large-bore arterial access, risk of requiring secondary intervention to maintain seal or patency. I explained that not all patients are candidates for endovascular repair based on their unique anatomy.  After detailed discussion the patient and I agree that the best option for the patient is ***.   Recommend:  Abstinence from all tobacco products. Blood glucose control with goal A1c < 7%. Blood pressure control with goal blood pressure < 140/90 mmHg. Lipid reduction therapy with goal LDL-C <100 mg/dL.  Aspirin 81mg  PO QD.  *** Clopidogrel 75mg  PO QD. ***  Rivaroxaban 2.5mg  PO BID. *** Cilostozal 100mg  PO BID for intermittent claudication without evidence of heart failure. Atorvastatin 40-80mg  PO QD (or other "high intensity" statin therapy).    CHIEF COMPLAINT: ***  HISTORY OF PRESENT ILLNESS: [name] is a 82 y.o. female ***  VASCULAR SURGICAL HISTORY: ***  VASCULAR RISK FACTORS: {FINDINGS; POSITIVE [phone]} history of stroke / transient ischemic attack. {FINDINGS; POSITIVE [phone]} history of coronary artery disease. *** history of PCI. *** history of CABG.  {FINDINGS; POSITIVE [phone]} history of diabetes mellitus. Last A1c ***. {FINDINGS; POSITIVE [phone]} history of smoking. *** actively smoking. {FINDINGS; POSITIVE [phone]} history of hypertension. *** drug regimen with *** control. {FINDINGS; POSITIVE [phone]} history of chronic kidney disease.  Last GFR ***. CKD {stage:30421363}. {FINDINGS; POSITIVE [phone]} history of chronic obstructive pulmonary disease, treated with ***.  FUNCTIONAL STATUS: ECOG performance status: {findings; ecog performance status:31780} Ambulatory status: {TNHAmbulation:25868}  CAREY 1 AND 3 YEAR INDEX Female (2pts) 75-79 or 80-84 (2pts) >84 (3pts) Dependence in toileting (1pt) Partial or full dependence in dressing (1pt) History of malignant neoplasm (2pts) CHF (3pts) COPD (1pts) CKD (3pts)  0-3 pts 6% 1 year mortality ; 21% 3 year mortality 4-5 pts 12% 1 year mortality ; 36% 3 year mortality >5 pts 21% 1 year mortality; 54% 3 year mortality   Past Medical History:  Diagnosis Date   Asthma    Depression    Diabetes mellitus without complication (HCC)    History of surgery on arm    right arm ( plate and screws)   Hypertension    Macular degeneration    Migraine    without aura   Pneumonia    Urinary incontinence     Past Surgical History:  Procedure Laterality Date   ABDOMINAL HYSTERECTOMY      ?Lt. ovary remains  APPENDECTOMY     Arm surgery     BREAST BIOPSY Right 10/10/2009   BREAST BIOPSY Right 09/17/2022   MM RT BREAST BX W LOC DEV 1ST LESION IMAGE BX SPEC STEREO GUIDE 09/17/2022 [facility]   BREAST CYST ASPIRATION  12/20/2013   BREAST EXCISIONAL BIOPSY Left 1998   BREAST EXCISIONAL BIOPSY Right 1993   ESOPHAGOGASTRODUODENOSCOPY N/A 04/17/2021   Procedure: ESOPHAGOGASTRODUODENOSCOPY (EGD);  Surgeon: Charna Elizabeth, MD;  Location[facility] ENDOSCOPY;  Service: Endoscopy;  Laterality: N/A;  Rectal bleeding and anemia.   ESOPHAGOGASTRODUODENOSCOPY N/A 04/18/2021   Procedure: ESOPHAGOGASTRODUODENOSCOPY (EGD);  Surgeon: Jeani Hawking, MD;  Location: [facility] ENDOSCOPY;  Service: Endoscopy;  Laterality: N/A;   HOT HEMOSTASIS N/A 04/18/2021   Procedure: HOT HEMOSTASIS (ARGON PLASMA COAGULATION/BICAP);  Surgeon: Jeani Hawking, MD;  Location: [facility] ENDOSCOPY;  Service: Endoscopy;  Laterality: N/A;   IR ANGIOGRAM SELECTIVE EACH ADDITIONAL VESSEL  04/17/2021   IR ANGIOGRAM VISCERAL SELECTIVE  04/17/2021   IR EMBO ART  VEN HEMORR LYMPH EXTRAV  INC GUIDE ROADMAPPING  04/17/2021   IR US GUIDE VASC ACCESS RIGHT  04/17/2021   RADIOLOGY WITH ANESTHESIA N/A 04/17/2021   Procedure: RADIOLOGY WITH ANESTHESIA;  Surgeon: Radiologist, Medication, MD;  Location: [facility] OR;  Service: Radiology;  Laterality: N/A;   SUBMUCOSAL INJECTION  04/18/2021   Procedure: SUBMUCOSAL INJECTION;  Surgeon: Jeani Hawking, MD;  Location: [facility] ENDOSCOPY;  Service: Endoscopy;;    Family History  Problem Relation Age of Onset   Hypertension Mother    Hypertension Father    Hearing loss Sister    Heart attack Sister 57       s/p 4 bypasses    Stroke Brother        45   Breast cancer Maternal Aunt    Ovarian cancer Maternal Aunt     Social History   Socioeconomic History   Marital status: Widowed    Spouse name: Not on file   Number of children: 2   Years of education: Not on file   Highest education level: Not on file  Occupational  History   Occupation: Retired  Tobacco Use   Smoking status: Former    Current packs/day: 0.00    Average packs/day: 2.0 packs/day for 33.6 years (67.3 ttl pk-yrs)    Types: Cigarettes    Start date: 08/11/1957    Quit date: 04/04/1991    Years since quitting: 31.9   Smokeless tobacco: Never  Vaping Use   Vaping status: Never Used  Substance and Sexual Activity   Alcohol use: No   Drug use: No   Sexual activity: Not on file  Other Topics Concern   Not on file  Social History Narrative   Not on file   Social Determinants of Health   Financial Resource Strain: Low Risk  (11/18/2022)   Overall Financial Resource Strain (CARDIA)    Difficulty of Paying Living Expenses: Not hard at all  Food Insecurity: No Food Insecurity (11/18/2022)   Hunger Vital Sign    Worried About Running Out of Food in the Last Year: Never true    Ran Out of Food in the Last Year: Never true  Transportation Needs: No Transportation Needs (11/18/2022)   PRAPARE - Administrator, Civil Service (Medical): No    Lack of Transportation (Non-Medical): No  Physical Activity: Inactive (11/18/2022)   Exercise Vital Sign    Days of Exercise per Week: 0 days    Minutes of Exercise per Session: 0 min  Stress:  No Stress Concern Present (11/18/2022)   [employer] of Occupational Health - Occupational Stress Questionnaire    Feeling of Stress : Not at all  Social Connections: Socially Isolated (11/18/2022)   Social Connection and Isolation Panel [NHANES]    Frequency of Communication with Friends and Family: More than three times a week    Frequency of Social Gatherings with Friends and Family: Twice a week    Attends Religious Services: Never    Database administrator or Organizations: No    Attends Banker Meetings: Never    Marital Status: Widowed  Intimate Partner Violence: Not At Risk (11/18/2022)   Humiliation, Afraid, Rape, and Kick questionnaire    Fear of Current or Ex-Partner: No     Emotionally Abused: No    Physically Abused: No    Sexually Abused: No    Allergies  Allergen Reactions   Amoxicillin Rash   Azithromycin Rash    Itching/rash   Butalbital Other (See Comments)    Reaction: trouble breathing, but uncertain   Clindamycin Hcl Rash   Januvia [Sitagliptin] Other (See Comments)    Worsened migraines.   Levaquin [Levofloxacin In D5w] Diarrhea   Meperidine Hcl Other (See Comments)    REACTION: itching and hallucinations   Sulfamethoxazole Rash   Fexofenadine Hcl Other (See Comments)    Lips pealing    Ketorolac Swelling    Received ketorolac, methocarbamal, and lidocaine patch all at the same time.  Lip swelling 12 hours later   Macrodantin [Nitrofurantoin Macrocrystal] Itching   Methocarbamol Swelling    Received ketorolac, methocarbamal, and lidocaine patch all at the same time.  Lip swelling 12 hours later   Tramadol    Codeine Phosphate Other (See Comments)    REACTION: unknown   Etodolac Other (See Comments)    REACTION: stomach upset   Gabapentin Other (See Comments)    REACTION: mental clouding  Does not want to try again even in low dose.   Naproxen Other (See Comments)    REACTION: stomach upset   Pentazocine-Naloxone Hcl Other (See Comments)    REACTION: unspecified   Sumatriptan Other (See Comments)    REACTION: chest pain   Trimethoprim Palpitations    SOB, tongue swelling   Zonegran Other (See Comments)    Reaction: Unknown    Current Outpatient Medications  Medication Sig Dispense Refill   acetic acid-hydrocortisone (VOSOL-HC) OTIC solution Place 3 drops into both ears 2 (two) times daily. 10 mL 3   Artificial Tear Ointment (DRY EYES OP) Apply 1 drop to eye 2 (two) times daily as needed (dry eyes).     baclofen (LIORESAL) 20 MG tablet TAKE ONE TABLET BY MOUTH THREE TIMES A DAY 270 tablet 0   benzonatate (TESSALON PERLES) 100 MG capsule Take 2 capsules (200 mg total) by mouth 3 (three) times daily as needed for cough. 20 capsule  0   busPIRone (BUSPAR) 5 MG tablet Take 1 tablet (5 mg total) by mouth 3 (three) times daily. 270 tablet 3   Clobetasol Prop Emollient Base (CLOBETASOL PROPIONATE E) 0.05 % emollient cream Apply 2 Applications topically at bedtime. 60 g 2   D-Mannose 500 MG CAPS Take 1 capsule by mouth daily. 30 capsule 5   DULoxetine (CYMBALTA) 30 MG capsule TAKE ONE CAPSULE BY MOUTH DAILY 90 capsule 3   enalapril (VASOTEC) 10 MG tablet Take 1 tablet (10 mg total) by mouth daily. 90 tablet 0   estradiol (ESTRACE) 0.1 MG/GM vaginal cream Place  0.5g nightly for two weeks then twice a week after 30 g 11   famotidine (PEPCID) 40 MG tablet Take 1 tablet (40 mg total) by mouth daily. 90 tablet 3   fexofenadine (ALLEGRA) 180 MG tablet Take 1 tablet (180 mg total) by mouth daily. 30 tablet 6   glucose blood (ONETOUCH VERIO) test strip USE  STRIP TO CHECK GLUCOSE TWICE DAILY AS DIRECTED. E11.9 100 each 3   Lancets (ONETOUCH DELICA PLUS LANCET33G) MISC USE TO CHECK GLUCOSE IN THE MORNING AND AT BEDTIME. E11.9 100 each 3   metoprolol succinate (TOPROL-XL) 25 MG 24 hr tablet TAKE ONE TABLET BY MOUTH DAILY 90 tablet 3   Multiple Vitamin (MULTIVITAMIN WITH MINERALS) TABS tablet Take 1 tablet by mouth daily.     ondansetron (ZOFRAN) 4 MG tablet Take 1 tablet (4 mg total) by mouth every 8 (eight) hours as needed for nausea or vomiting. 20 tablet 4   oxyCODONE-acetaminophen (PERCOCET/ROXICET) 5-325 MG tablet Take 0.5 tablets by mouth every 6 (six) hours as needed for severe pain. 60 tablet 0   pantoprazole (PROTONIX) 40 MG tablet TAKE ONE TABLET BY MOUTH DAILY 90 tablet 3   rosuvastatin (CRESTOR) 20 MG tablet Take 1 tablet (20 mg total) by mouth daily. 90 tablet 3   Semaglutide, 1 MG/DOSE, (OZEMPIC, 1 MG/DOSE,) 2 MG/1.5ML SOPN Inject 2 mg into the skin once a week. (Patient taking differently: Inject 1 mg into the skin every Sunday.)     triamcinolone ointment (KENALOG) 0.5 % Apply 1 application topically 2 (two) times daily. For  elbows (Patient taking differently: Apply 1 application  topically 2 (two) times daily as needed (elbow itching).) 30 g 2   trospium (SANCTURA) 20 MG tablet Take 1 tablet (20 mg total) by mouth 2 (two) times daily. 60 tablet 1   No current facility-administered medications for this visit.    PHYSICAL EXAM There were no vitals filed for this visit.  Constitutional: *** appearing. *** distress. Appears *** nourished.  Neurologic: CN ***. *** focal findings. *** sensory loss. Psychiatric: *** Mood and affect symmetric and appropriate. Eyes: *** No icterus. No conjunctival pallor. Ears, nose, throat: *** mucous membranes moist. Midline trachea.  Cardiac: *** rate and rhythm.  Respiratory: *** unlabored. Abdominal: *** soft, non-tender, non-distended.  Peripheral vascular: *** Extremity: *** edema. *** cyanosis. *** pallor.  Skin: *** gangrene. *** ulceration.  Lymphatic: *** Stemmer's sign. *** palpable lymphadenopathy.    PERTINENT LABORATORY AND RADIOLOGIC DATA  Most recent CBC    Latest Ref Rng & Units 08/18/2022    9:31 AM 12/06/2021   11:16 AM 09/18/2021    2:12 PM  CBC  WBC 3.4 - 10.8 x10E3/uL 5.7   7.1   Hemoglobin 11.1 - 15.9 g/dL 16.1  09.6  04.5   Hematocrit 34.0 - 46.6 % 36.8   34.5   Platelets 150 - 450 x10E3/uL 167   200      Most recent CMP    Latest Ref Rng & Units 08/18/2022    9:31 AM 12/06/2021    2:05 PM 08/13/2021    2:23 PM  CMP  Glucose 70 - 99 mg/dL 65  79  409   BUN 8 - 27 mg/dL 22  20  20    Creatinine 0.57 - 1.00 mg/dL 8.11  9.14  7.82   Sodium 134 - 144 mmol/L 140  142  137   Potassium 3.5 - 5.2 mmol/L 4.7  4.4  5.2   Chloride 96 - 106 mmol/L  101  101  100   CO2 20 - 29 mmol/L 24  23  23    Calcium 8.7 - 10.3 mg/dL 9.7  9.6  9.9   Total Protein 6.0 - 8.5 g/dL 6.4  6.5  6.6   Total Bilirubin 0.0 - 1.2 mg/dL 0.5  0.4  <4.1   Alkaline Phos 44 - 121 IU/L 107  109  146   AST 0 - 40 IU/L 31  36  57   ALT 0 - 32 IU/L 18  24  64     Renal function CrCl  cannot be calculated (Patient's most recent lab result is older than the maximum 21 days allowed.).  HbA1c, POC (controlled diabetic range) (%)  Date Value  09/24/2022 6.1    LDL Chol Calc (NIH)  Date Value Ref Range Status  02/28/2021 67 0 - 99 mg/dL Final     Vascular Imaging: ***  Janes Colegrove N. Lenell Antu, MD [facility] Phone Number: [phone] 03/01/2023 2:19 PM   Total time spent on preparing this encounter including chart review, data review, collecting history, examining the patient, coordinating care for this {tnhtimebilling:26202}  Portions of this report may have been transcribed using voice recognition software.  Every effort has been made to ensure accuracy; however, inadvertent computerized transcription errors may still be present.

## 2023-03-02 ENCOUNTER — Ambulatory Visit (HOSPITAL_COMMUNITY)
Admission: RE | Admit: 2023-03-02 | Discharge: 2023-03-02 | Disposition: A | Discharge status: Home / Self Care | Payer: PPO | Source: Ambulatory Visit | Attending: Vascular Surgery | Admitting: Vascular Surgery

## 2023-03-02 ENCOUNTER — Encounter: Payer: Self-pay | Admitting: Vascular Surgery

## 2023-03-02 ENCOUNTER — Ambulatory Visit: Payer: PPO | Admitting: Vascular Surgery

## 2023-03-02 VITALS — BP 118/82 | HR 87 | Temp 98.1°F | Resp 20 | Ht 59.0 in | Wt 183.0 lb

## 2023-03-02 DIAGNOSIS — I7143 Infrarenal abdominal aortic aneurysm, without rupture: Secondary | ICD-10-CM

## 2023-03-02 DIAGNOSIS — I700 Atherosclerosis of aorta: Secondary | ICD-10-CM | POA: Insufficient documentation

## 2023-03-02 MED ORDER — OXYCODONE-ACETAMINOPHEN 5-325 MG PO TABS: 0.5000 | ORAL_TABLET | Freq: Four times a day (QID) | ORAL | 0 refills | Status: DC | PRN

## 2023-03-04 DIAGNOSIS — L821 Other seborrheic keratosis: Secondary | ICD-10-CM | POA: Diagnosis not present

## 2023-03-04 DIAGNOSIS — L814 Other melanin hyperpigmentation: Secondary | ICD-10-CM | POA: Diagnosis not present

## 2023-03-04 DIAGNOSIS — L918 Other hypertrophic disorders of the skin: Secondary | ICD-10-CM | POA: Diagnosis not present

## 2023-03-04 DIAGNOSIS — L304 Erythema intertrigo: Secondary | ICD-10-CM | POA: Diagnosis not present

## 2023-03-04 DIAGNOSIS — L298 Other pruritus: Secondary | ICD-10-CM | POA: Diagnosis not present

## 2023-03-04 DIAGNOSIS — L218 Other seborrheic dermatitis: Secondary | ICD-10-CM | POA: Diagnosis not present

## 2023-03-04 DIAGNOSIS — D225 Melanocytic nevi of trunk: Secondary | ICD-10-CM | POA: Diagnosis not present

## 2023-03-06 DIAGNOSIS — M4800 Spinal stenosis, site unspecified: Secondary | ICD-10-CM | POA: Diagnosis not present

## 2023-03-12 ENCOUNTER — Ambulatory Visit: Payer: PPO | Admitting: Obstetrics and Gynecology

## 2023-03-12 ENCOUNTER — Encounter: Payer: Self-pay | Admitting: Obstetrics and Gynecology

## 2023-03-12 VITALS — BP 121/79 | HR 96

## 2023-03-12 DIAGNOSIS — R350 Frequency of micturition: Secondary | ICD-10-CM | POA: Diagnosis not present

## 2023-03-12 DIAGNOSIS — N3281 Overactive bladder: Secondary | ICD-10-CM

## 2023-03-12 LAB — POCT URINALYSIS DIPSTICK
Appearance: ABNORMAL
Bilirubin, UA: NEGATIVE
Blood, UA: NEGATIVE
Glucose, UA: NEGATIVE
Ketones, UA: NEGATIVE
Leukocytes, UA: NEGATIVE
Nitrite, UA: NEGATIVE
Protein, UA: NEGATIVE
Spec Grav, UA: 1.02 (ref 1.010–1.025)
Urobilinogen, UA: 0.2 E.U./dL
pH, UA: 6 (ref 5.0–8.0)

## 2023-03-12 MED ORDER — LIDOCAINE HCL 2 % IJ SOLN
50.0000 mL | Freq: Once | INTRAMUSCULAR | Status: AC
  Administered 2023-03-12: 1000 mg

## 2023-03-12 MED ORDER — CIPROFLOXACIN HCL 500 MG PO TABS
500.0000 mg | ORAL_TABLET | Freq: Once | ORAL | Status: AC
  Administered 2023-03-12: 500 mg via ORAL

## 2023-03-12 MED ORDER — LIDOCAINE HCL URETHRAL/MUCOSAL 2 % EX GEL
1.0000 | Freq: Once | CUTANEOUS | Status: AC
  Administered 2023-03-12: 1 via URETHRAL

## 2023-03-12 MED ORDER — ONABOTULINUMTOXINA 100 UNITS IJ SOLR
100.0000 [IU] | Freq: Once | INTRAMUSCULAR | Status: AC
  Administered 2023-03-12 (×2): 100 [IU] via INTRAMUSCULAR

## 2023-03-12 NOTE — Patient Instructions (Signed)
Taking Care of Yourself after Urodynamics, Cystoscopy, Bulkamid Injection, or Botox Injection   Drink plenty of water for a day or two following your procedure. Try to have about 8 ounces (one cup) at a time, and do this 6 times or more per day unless you have fluid restrictitons AVOID irritative beverages such as coffee, tea, soda, alcoholic or citrus drinks for a day or two, as this may cause burning with urination.  For the first 1-2 days after the procedure, your urine may be pink or red in color. You may have some blood in your urine as a normal side effect of the procedure. Large amounts of bleeding or difficulty urinating are NOT normal. Call the nurse line if this happens or go to the nearest Emergency Room if the bleeding is heavy or you cannot urinate at all and it is after hours.  You may experience some discomfort or a burning sensation with urination after having this procedure. You can use over the counter Azo or pyridium to help with burning and follow the instructions on the packaging. If it does not improve within 1-2 days, or other symptoms appear (fever, chills, or difficulty urinating) call the office to speak to a nurse.  You may return to normal daily activities such as work, school, driving, exercising and housework on the day of the procedure. If your doctor gave you a prescription, take it as ordered.     

## 2023-03-12 NOTE — Progress Notes (Signed)
Intravesical Botox Procedure:  82 y.o. yo F with OAB presenting today for intravesical botox. (Last botox injection was 03/24/22).   Vitals:   03/12/23 1009  BP: 121/79  Pulse: 96    POC urine: trace blood, otherwise negative  Prior to the procedure, the patient took ciprofloxacin for antibiotic prophylaxis. Time out was performed. The bladder was catherized and 50 ml of 2% lidocaine was placed in the bladder and 10 ml of 2% lidocaine jelly placed in the urethra. After 30 minutes the lidocaine was drained. Cystoscopy was performed with sterile H2O and a 70 degree scope. Bladder mucosa was noted to be within normal limits. A total of 10 ml / 100 units of Botox A,  Lot # [id number] Exp 11/2024  was injected in the detrusor muscle via 5 injections of 2ml each. These were spaced about 1 cm apart. Care was taken to avoid the ureteral orifices and the trigone. Bleeding was hemostatic at the end of the procedure. Patient tolerated the procedure well.  Impression: 82 y.o. 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   Plan: Post-procedure instructions given regarding bleeding, infection, urinary retention.  Self-catheterization teaching was previously performed.   Patient will follow up in 4 weeks All questions answered.  Marguerita Beards, MD

## 2023-03-18 DIAGNOSIS — H353132 Nonexudative age-related macular degeneration, bilateral, intermediate dry stage: Secondary | ICD-10-CM | POA: Diagnosis not present

## 2023-03-18 DIAGNOSIS — E113391 Type 2 diabetes mellitus with moderate nonproliferative diabetic retinopathy without macular edema, right eye: Secondary | ICD-10-CM | POA: Diagnosis not present

## 2023-03-18 DIAGNOSIS — H35033 Hypertensive retinopathy, bilateral: Secondary | ICD-10-CM | POA: Diagnosis not present

## 2023-03-18 DIAGNOSIS — H43813 Vitreous degeneration, bilateral: Secondary | ICD-10-CM | POA: Diagnosis not present

## 2023-03-18 DIAGNOSIS — M48061 Spinal stenosis, lumbar region without neurogenic claudication: Secondary | ICD-10-CM | POA: Diagnosis not present

## 2023-03-18 DIAGNOSIS — Z6836 Body mass index (BMI) 36.0-36.9, adult: Secondary | ICD-10-CM | POA: Diagnosis not present

## 2023-03-18 DIAGNOSIS — E113412 Type 2 diabetes mellitus with severe nonproliferative diabetic retinopathy with macular edema, left eye: Secondary | ICD-10-CM | POA: Diagnosis not present

## 2023-03-18 DIAGNOSIS — H04123 Dry eye syndrome of bilateral lacrimal glands: Secondary | ICD-10-CM | POA: Diagnosis not present

## 2023-03-19 ENCOUNTER — Other Ambulatory Visit: Payer: Self-pay | Admitting: Obstetrics and Gynecology

## 2023-03-20 NOTE — Telephone Encounter (Signed)
[facility] requested a Trospium 60mg  refill. Medication has been sent to the pharmacy.

## 2023-03-26 ENCOUNTER — Other Ambulatory Visit: Payer: Self-pay

## 2023-03-26 DIAGNOSIS — E7800 Pure hypercholesterolemia, unspecified: Secondary | ICD-10-CM

## 2023-03-27 MED ORDER — ROSUVASTATIN CALCIUM 20 MG PO TABS: 20.0000 mg | ORAL_TABLET | Freq: Every day | ORAL | 3 refills | Status: DC

## 2023-03-30 ENCOUNTER — Other Ambulatory Visit: Payer: Self-pay

## 2023-03-30 DIAGNOSIS — G9059 Complex regional pain syndrome I of other specified site: Secondary | ICD-10-CM

## 2023-03-30 MED ORDER — BACLOFEN 20 MG PO TABS: 20.0000 mg | ORAL_TABLET | Freq: Three times a day (TID) | ORAL | 0 refills | Status: DC

## 2023-03-31 ENCOUNTER — Other Ambulatory Visit: Payer: Self-pay

## 2023-03-31 DIAGNOSIS — M159 Polyosteoarthritis, unspecified: Secondary | ICD-10-CM

## 2023-03-31 DIAGNOSIS — G8929 Other chronic pain: Secondary | ICD-10-CM

## 2023-03-31 MED ORDER — OXYCODONE-ACETAMINOPHEN 5-325 MG PO TABS: 0.5000 | ORAL_TABLET | Freq: Four times a day (QID) | ORAL | 0 refills | Status: DC | PRN

## 2023-04-03 ENCOUNTER — Encounter: Payer: Self-pay | Admitting: Physician Assistant

## 2023-04-03 ENCOUNTER — Other Ambulatory Visit: Payer: Self-pay

## 2023-04-03 ENCOUNTER — Ambulatory Visit: Payer: PPO | Admitting: Physician Assistant

## 2023-04-03 DIAGNOSIS — G8929 Other chronic pain: Secondary | ICD-10-CM | POA: Diagnosis not present

## 2023-04-03 DIAGNOSIS — M25562 Pain in left knee: Secondary | ICD-10-CM | POA: Diagnosis not present

## 2023-04-03 DIAGNOSIS — M1712 Unilateral primary osteoarthritis, left knee: Secondary | ICD-10-CM | POA: Diagnosis not present

## 2023-04-03 NOTE — Progress Notes (Signed)
Office Visit Note   Patient: [name]           Date of Birth: September 05, 1940           MRN: [id number] Visit Date: 04/03/2023              Requested by: Caro Laroche, DO [address] [place],  [place] [address] PCP: Caro Laroche, DO  Chief Complaint  Patient presents with   Left Knee - Pain      HPI: Patient is a pleasant 82 year old woman who is status post fall last evening.  She landed on her face and left knee.  Left knee is bruised and swollen.  She does say it hurts to walk.  She has a known history of arthritis of her left knee.  Rates her pain is moderate.  Does take oxycodone low-dose denies any loss of consciousness she does say her face hurt quite a bit last night and her glasses hit the front of her face but is somewhat better today she denies any dizziness nausea vomiting but has had a slight headache   Assessment & Plan: Visit Diagnoses:  1. Primary osteoarthritis of left knee     Plan: With regards to her knee I will treat this symptomatically I would like for her to use her walker and refrain putting much weight on this ice as well.  Daily cleansing of the abrasion on the front of her knee.  She has quite a bit of ecchymosis in her face but she is symmetric appropriate to exam.  I have asked that she contact her primary care provider for follow-up on her facial injuries.  She also knows of course that if she has any nausea vomiting increased pain in her knee or face she is to go to the nearest emergency room or call 911.  She may follow-up with Kriste Basque next week.  Follow-Up Instructions: With primary care as soon as possible.  With emergency services if she has any concerns over the weekend.  With Kriste Basque in 1 week  Ortho Exam  Patient is alert, oriented, no adenopathy, well-dressed, normal affect, normal respiratory effort. Examination of her left knee she has moderate ecchymosis and an abrasion across the anterior aspect of her knee her  compartments are soft and compressible she has pain with extension and flexion but can do these and hold a straight leg raise.  No pain with manipulation of her hip.  She is able to follow my finger with her eyes pupils are equally reactive.  She does have ecchymosis and a small abrasion across the bridge of her nose she neurovascular intact.  She has an antalgic gait but no balance issues.  Imaging: No results found. No images are attached to the encounter.  Labs: Lab Results  Component Value Date   HGBA1C 6.1 09/24/2022   HGBA1C 6.3 06/12/2022   HGBA1C 9.8 (A) 03/13/2022   ESRSEDRATE 17 12/30/2013   REPTSTATUS 05/22/2022 FINAL 05/20/2022   CULT >=100,000 COLONIES/mL ESCHERICHIA COLI (A) 05/20/2022   LABORGA ESCHERICHIA COLI (A) 05/20/2022     Lab Results  Component Value Date   ALBUMIN 4.6 08/18/2022   ALBUMIN 4.5 12/06/2021   ALBUMIN 4.1 08/13/2021    Lab Results  Component Value Date   MG 2.3 04/19/2021   MG 1.7 04/18/2021   No results found for: "VD25OH"  No results found for: "PREALBUMIN"    Latest Ref Rng & Units 08/18/2022    9:31 AM  12/06/2021   11:16 AM 09/18/2021    2:12 PM  CBC EXTENDED  WBC 3.4 - 10.8 x10E3/uL 5.7   7.1   RBC 3.77 - 5.28 x10E6/uL 4.15   4.24   Hemoglobin 11.1 - 15.9 g/dL 16.1  09.6  04.5   HCT 34.0 - 46.6 % 36.8   34.5   Platelets 150 - 450 x10E3/uL 167   200      There is no height or weight on file to calculate BMI.  Orders:  Orders Placed This Encounter  Procedures   XR KNEE 3 VIEW LEFT   No orders of the defined types were placed in this encounter.    Procedures: No procedures performed  Clinical Data: No additional findings.  ROS:  All other systems negative, except as noted in the HPI. Review of Systems  Objective: Vital Signs: There were no vitals taken for this visit.  Specialty Comments:  No specialty comments available.  PMFS History: Patient Active Problem List   Diagnosis Date Noted   Right leg swelling  10/19/2022   Pruritus 08/18/2022   Other fatigue 12/06/2021   Chronic pain syndrome    Rotator cuff syndrome of right shoulder 01/21/2019   Bladder prolapse, female, acquired 08/16/2018   Urinary incontinence 07/05/2018   Adjustment reaction with anxiety and depression 03/26/2017   Iron deficiency anemia due to chronic blood loss 10/31/2015   Chronic right-sided thoracic back pain 07/26/2015   Spinal stenosis of lumbar region 07/10/2015   Abdominal aortic atherosclerosis (HCC) 07/10/2015   Sleep apnea 05/22/2010   Hereditary and idiopathic peripheral neuropathy 02/06/2010   Irritable bowel syndrome 11/12/2007   Diabetes mellitus type 2, controlled, with complications (HCC) 10/08/2006   HYPERCHOLESTEROLEMIA 10/08/2006   HYPERTRIGLYCERIDEMIA 10/08/2006   Migraine headache 10/08/2006   Hypertension 10/08/2006   ASTHMA, PERSISTENT, MODERATE 10/08/2006   Reflux esophagitis 10/08/2006   Peptic ulcer 10/08/2006   Eczema 10/08/2006   Psoriasis 10/08/2006   Osteoarthritis, multiple sites 10/08/2006   VERTIGO NOS OR DIZZINESS 10/08/2006   Past Medical History:  Diagnosis Date   Asthma    Depression    Diabetes mellitus without complication (HCC)    History of surgery on arm    right arm ( plate and screws)   Hypertension    Macular degeneration    Migraine    without aura   Pneumonia    Urinary incontinence     Family History  Problem Relation Age of Onset   Hypertension Mother    Hypertension Father    Hearing loss Sister    Heart attack Sister 58       s/p 4 bypasses    Stroke Brother        36   Breast cancer Maternal Aunt    Ovarian cancer Maternal Aunt     Past Surgical History:  Procedure Laterality Date   ABDOMINAL HYSTERECTOMY     ?Lt. ovary remains   APPENDECTOMY     Arm surgery     BREAST BIOPSY Right 10/10/2009   BREAST BIOPSY Right 09/17/2022   MM RT BREAST BX W LOC DEV 1ST LESION IMAGE BX SPEC STEREO GUIDE 09/17/2022 [facility]   BREAST CYST  ASPIRATION  12/20/2013   BREAST EXCISIONAL BIOPSY Left 1998   BREAST EXCISIONAL BIOPSY Right 1993   ESOPHAGOGASTRODUODENOSCOPY N/A 04/17/2021   Procedure: ESOPHAGOGASTRODUODENOSCOPY (EGD);  Surgeon: Charna Elizabeth, MD;  Location[facility] ENDOSCOPY;  Service: Endoscopy;  Laterality: N/A;  Rectal bleeding and anemia.   ESOPHAGOGASTRODUODENOSCOPY N/A 04/18/2021  Procedure: ESOPHAGOGASTRODUODENOSCOPY (EGD);  Surgeon: Jeani Hawking, MD;  Location: [facility] ENDOSCOPY;  Service: Endoscopy;  Laterality: N/A;   HOT HEMOSTASIS N/A 04/18/2021   Procedure: HOT HEMOSTASIS (ARGON PLASMA COAGULATION/BICAP);  Surgeon: Jeani Hawking, MD;  Location: [facility] ENDOSCOPY;  Service: Endoscopy;  Laterality: N/A;   IR ANGIOGRAM SELECTIVE EACH ADDITIONAL VESSEL  04/17/2021   IR ANGIOGRAM VISCERAL SELECTIVE  04/17/2021   IR EMBO ART  VEN HEMORR LYMPH EXTRAV  INC GUIDE ROADMAPPING  04/17/2021   IR US GUIDE VASC ACCESS RIGHT  04/17/2021   RADIOLOGY WITH ANESTHESIA N/A 04/17/2021   Procedure: RADIOLOGY WITH ANESTHESIA;  Surgeon: Radiologist, Medication, MD;  Location: [facility] OR;  Service: Radiology;  Laterality: N/A;   SUBMUCOSAL INJECTION  04/18/2021   Procedure: SUBMUCOSAL INJECTION;  Surgeon: Jeani Hawking, MD;  Location: [facility] ENDOSCOPY;  Service: Endoscopy;;   Social History   Occupational History   Occupation: Retired  Tobacco Use   Smoking status: Former    Current packs/day: 0.00    Average packs/day: 2.0 packs/day for 33.6 years (67.3 ttl pk-yrs)    Types: Cigarettes    Start date: 08/11/1957    Quit date: 04/04/1991    Years since quitting: 32.0   Smokeless tobacco: Never  Vaping Use   Vaping status: Never Used  Substance and Sexual Activity   Alcohol use: No   Drug use: No   Sexual activity: Not on file

## 2023-04-06 ENCOUNTER — Telehealth: Payer: Self-pay

## 2023-04-06 NOTE — Telephone Encounter (Signed)
Patient calls nurse line regarding fall that occurred on Thursday, outside while checking the mail.   Fell on face, swollen knee, black eyes. Denies LOC.   Does not take blood thinner.   Went to orthopedic provider on Thursday. X-ray was negative for fracture, however, was told that they could not rule on hair line fracture. They also advised that she follow up with PCP office regarding fall.   Scheduled patient for tomorrow afternoon with Dr. Elliot Gurney.   ED precautions discussed.   Veronda Prude, RN

## 2023-04-07 ENCOUNTER — Ambulatory Visit: Payer: PPO | Admitting: Student

## 2023-04-07 ENCOUNTER — Ambulatory Visit: Payer: PPO

## 2023-04-08 ENCOUNTER — Ambulatory Visit: Payer: PPO | Admitting: Student

## 2023-04-08 ENCOUNTER — Ambulatory Visit (HOSPITAL_COMMUNITY)
Admission: RE | Admit: 2023-04-08 | Discharge: 2023-04-08 | Disposition: A | Discharge status: Home / Self Care | Payer: PPO | Source: Ambulatory Visit | Attending: Family Medicine | Admitting: Family Medicine

## 2023-04-08 ENCOUNTER — Encounter: Payer: Self-pay | Admitting: Student

## 2023-04-08 ENCOUNTER — Other Ambulatory Visit: Payer: Self-pay

## 2023-04-08 ENCOUNTER — Ambulatory Visit (INDEPENDENT_AMBULATORY_CARE_PROVIDER_SITE_OTHER): Payer: PPO | Admitting: Student

## 2023-04-08 VITALS — BP 132/82 | HR 101 | Ht 60.0 in | Wt 184.6 lb

## 2023-04-08 DIAGNOSIS — S8002XA Contusion of left knee, initial encounter: Secondary | ICD-10-CM | POA: Diagnosis not present

## 2023-04-08 DIAGNOSIS — R519 Headache, unspecified: Secondary | ICD-10-CM | POA: Diagnosis not present

## 2023-04-08 DIAGNOSIS — I6782 Cerebral ischemia: Secondary | ICD-10-CM | POA: Diagnosis not present

## 2023-04-08 DIAGNOSIS — J3489 Other specified disorders of nose and nasal sinuses: Secondary | ICD-10-CM | POA: Diagnosis not present

## 2023-04-08 DIAGNOSIS — S0990XA Unspecified injury of head, initial encounter: Secondary | ICD-10-CM | POA: Diagnosis not present

## 2023-04-08 DIAGNOSIS — S022XXA Fracture of nasal bones, initial encounter for closed fracture: Secondary | ICD-10-CM | POA: Diagnosis not present

## 2023-04-08 MED ORDER — FLUTICASONE PROPIONATE 50 MCG/ACT NA SUSP: 2.0000 | Freq: Every day | NASAL | 6 refills | Status: DC

## 2023-04-08 MED ORDER — CETIRIZINE HCL 5 MG PO TABS: 5.0000 mg | ORAL_TABLET | Freq: Every day | ORAL | 8 refills | Status: DC | PRN

## 2023-04-08 NOTE — Assessment & Plan Note (Signed)
Patient comes in for follow-up of fall, that happened last Thursday, while patient was walking and tripped outside.  Patient fell and hit knee, knee is bruised, warm, swollen, good range of motion.  Patient is otherwise in normal state of health, reports no systemic symptoms, low concern for infection, or septic joint.  Patient knee likely warm secondary to joint effusion.  Patient plans to follow-up with orthopedist this week, will inquire if she can have joint tapped.  Also placed instructions for follow-up with sports med if Ortho unable to assist with ultrasound of knee/tapping knee. - Follow-up with Ortho - Consider follow-up with sports med - Consider ultrasound of knee/tap if hemarthrosis

## 2023-04-08 NOTE — Progress Notes (Signed)
SUBJECTIVE:   CHIEF COMPLAINT / HPI:   F/u Fall -Pt had fall on 04/02/23, saw orthopedic provider w/ negative x-rays   Today noted: Fall happened when she was going to get her mail, she was walking and foot got caught in draiveway, which is uneven. She reports she did not loose consciousness. No prodromal symptoms prior to fall. She fell face forward and face planted. Neighbor saw her and helped. Notes the cut on her face and knee are healing and she has been using. Also notes knee has been hot and swollen, but still has FROM. No fever's or systemic symptoms. Some tenderness and redness to knee. Has been this way since the fall.   Runny nose / HA Notes clear liquid running down nose. Has been happening sporadically. Thin water like fluid running out of nose. Swelling has improved, but nose still feels congested. Also has had a HA since her fall. Feels like a normal dull ache in front of head. Tylenol has helped take off the edge, 2 extra strength.    PERTINENT  PMH / PSH:     Patient Care Team: Caro Laroche, DO as PCP - General (Family Medicine) OBJECTIVE:  BP 132/82   Pulse (!) 101   Ht 5' (1.524 m)   Wt 184 lb 9.6 oz (83.7 kg)   SpO2 97%   BMI 36.05 kg/m  Physical Exam Constitutional:      General: She is not in acute distress.    Appearance: Normal appearance. She is not ill-appearing.  Eyes:     General: No visual field deficit. Musculoskeletal:     Right knee: Normal.     Left knee: Swelling, effusion, erythema and ecchymosis present. No lacerations. Normal range of motion. Tenderness present.  Neurological:     Mental Status: She is alert. Mental status is at baseline.     Cranial Nerves: Cranial nerves 2-12 are intact. No cranial nerve deficit, dysarthria or facial asymmetry.     Sensory: Sensation is intact. No sensory deficit.     Motor: Motor function is intact. No weakness or tremor.     Gait: Gait is intact.  Psychiatric:        Mood and Affect: Mood  normal.        Behavior: Behavior normal.      ASSESSMENT/PLAN:  Traumatic injury of head, initial encounter Assessment & Plan: Patient comes in following fall, last Thursday, while walking tripping and driveway.  Patient saw orthopedics who had negative imaging of bruised knee, but did not scan head.  Patient has appreciated headache since accident, is also appreciating clear liquid draining down nose following accident.  Patient reports headache is relieved some by Tylenol.  Patient has normal neuroexam, given recent accident, and history, concern for CSF leak,/skull fracture.  Will have patient sent to obtain imaging.  Nasal drainage could be coming from allergies, but given history, and bruising on face concern for trauma. - CT head Noncon  Orders: -     CT HEAD WO CONTRAST ( ); Future  Contusion of left knee, initial encounter Assessment & Plan: Patient comes in for follow-up of fall, that happened last Thursday, while patient was walking and tripped outside.  Patient fell and hit knee, knee is bruised, warm, swollen, good range of motion.  Patient is otherwise in normal state of health, reports no systemic symptoms, low concern for infection, or septic joint.  Patient knee likely warm secondary to joint effusion.  Patient plans to follow-up with orthopedist  this week, will inquire if she can have joint tapped.  Also placed instructions for follow-up with sports med if Ortho unable to assist with ultrasound of knee/tapping knee. - Follow-up with Ortho - Consider follow-up with sports med - Consider ultrasound of knee/tap if hemarthrosis   Rhinorrhea Assessment & Plan: Patient complains of rhinorrhea since fall last Thursday.  Patient fell flat on face, has not had any head imaging performed.  Patient reports thin watery mucus draining from nose frequently.  Concern for CSF leak given history of trauma, however also possible this is coming from allergies.  Will trial allergy meds.  Will  also obtain head imaging to rule out CSF leak/skull fracture. - Zyrtec 5 mg daily - Fluticasone 2 sprays each nostril daily - CT head Noncon   Other orders -     Cetirizine HCl; Take 1 tablet (5 mg total) by mouth daily as needed for allergies.  Dispense: 30 tablet; Refill: 8 -     Fluticasone Propionate; Place 2 sprays into both nostrils daily.  Dispense: 16 g; Refill: 6   No follow-ups on file. Bess Kinds, MD 04/08/2023, 5:03 PM PGY-3, [facility]

## 2023-04-08 NOTE — Assessment & Plan Note (Signed)
Patient comes in following fall, last Thursday, while walking tripping and driveway.  Patient saw orthopedics who had negative imaging of bruised knee, but did not scan head.  Patient has appreciated headache since accident, is also appreciating clear liquid draining down nose following accident.  Patient reports headache is relieved some by Tylenol.  Patient has normal neuroexam, given recent accident, and history, concern for CSF leak,/skull fracture.  Will have patient sent to obtain imaging.  Nasal drainage could be coming from allergies, but given history, and bruising on face concern for trauma. - CT head Noncon

## 2023-04-08 NOTE — Patient Instructions (Addendum)
It was great to see you! Thank you for allowing me to participate in your care!  I recommend that you always bring your medications to each appointment as this makes it easy to ensure we are on the correct medications and helps [place] not miss when refills are needed.  Our plans for today:  - Knee Pain/Swelling It's normal for blood in a joint to cause it to swell and be warm. We are going to recommend you go to Sport's Medicine, for [place] and possibly drainage  [facility]  Address: [address] [place], [place], [place] [address] Phone: [phone]  - Runny Nose / Headache I'm not sure if this is coming from allergies or possibly something more concerning. Sometimes people can have issues with their head, and a fracture that lets fluid leak out from the skull/brain. This could be an emergency. I'm going to get some head imaging on you.   CT Head Scan at [facility]   (RN will notify you of timing)  We will also try some Zyrtec and Flonase incase this is allergies   Allergy Meds: Zyrtec, 5 mg daily    Flonase, 2 sprays each nostril    Pain Meds:    Take Tylenol and Ibuprofen as needed     Max daily dose of Tylenol 3,200 mg      (650 mg every 6 hours)      Max daily dose of Ibuprofen 2,400 mg      (800 mg every 8 hours)    Take care and seek immediate care sooner if you develop any concerns.   Dr. Bess Kinds, MD [facility]

## 2023-04-08 NOTE — Assessment & Plan Note (Signed)
Patient complains of rhinorrhea since fall last Thursday.  Patient fell flat on face, has not had any head imaging performed.  Patient reports thin watery mucus draining from nose frequently.  Concern for CSF leak given history of trauma, however also possible this is coming from allergies.  Will trial allergy meds.  Will also obtain head imaging to rule out CSF leak/skull fracture. - Zyrtec 5 mg daily - Fluticasone 2 sprays each nostril daily - CT head Noncon

## 2023-04-09 ENCOUNTER — Ambulatory Visit: Payer: PPO | Admitting: Physician Assistant

## 2023-04-09 ENCOUNTER — Other Ambulatory Visit: Payer: Self-pay | Admitting: Student

## 2023-04-09 DIAGNOSIS — M1712 Unilateral primary osteoarthritis, left knee: Secondary | ICD-10-CM | POA: Diagnosis not present

## 2023-04-09 DIAGNOSIS — S022XXA Fracture of nasal bones, initial encounter for closed fracture: Secondary | ICD-10-CM

## 2023-04-09 MED ORDER — METHYLPREDNISOLONE ACETATE 40 MG/ML IJ SUSP
40.0000 mg | INTRAMUSCULAR | Status: AC | PRN
  Administered 2023-04-09: 40 mg via INTRA_ARTICULAR

## 2023-04-09 MED ORDER — LIDOCAINE HCL 1 % IJ SOLN
5.0000 mL | INTRAMUSCULAR | Status: AC | PRN
  Administered 2023-04-09: 5 mL

## 2023-04-09 NOTE — Progress Notes (Signed)
Patient head imaging demonstrated fractured nasal bone on right side. Will refer to ENT.   Called and discussed plan for ENT referral with patient.  Discussed patient to follow-up with clinic in 2 weeks if she has not heard anything about ENT referral.  Patient in agreement with plan

## 2023-04-09 NOTE — Progress Notes (Signed)
HPI: This is [name] returns today for follow-up of her left knee pain.  She unfortunately had a fall that was mechanical on 04/02/2023.  She was seen by Cecille Rubin here in the office on 04/03/2023.  Radiographs were obtained.  I have reviewed the radiographs of the left knee 3 view show no acute fracture or acute findings.  Lateral joint line near bone-on-bone.  Mild to moderate medial compartmental changes.  Moderate patellofemoral changes unchanged from prior films.  Knee is well located.  She states that the bruising is subsiding.  She is walking with a rollator.  Reports that she broke her nose.  She is also having some clear drainage from her nose.  Review of systems: Negative for fevers chills.  Physical exam: General well-developed well-nourished female in no acute distress. Facial: She has ecchymosis left side of her face.  Left knee significant ecchymosis tickly medial aspect of the knee that tracks down into the mid anterior medial lower leg.  No gross instability valgus varus stressing left knee.  Positive effusion.  No abnormal warmth.  There is an abrasion over the anterior aspect of the knee and the lateral aspect of the knee as just below the joint line.  No significant warmth knee.  No signs of infection overall.  Full extension flexion to at least 90 degrees.  Impression: Left knee osteoarthritis Left knee acute pain  Plan: She will follow-up with her primary care physician in regards to the nose fracture and drainage.  In regards to the knee and she is agreeable to aspiration and injection of the knee.     Procedure Note  Patient: [name]             Date of Birth: 02-13-1941           MRN: [id number]             Visit Date: 04/09/2023  Procedures: Visit Diagnoses: No diagnosis found.  Large Joint Inj: L knee on 04/09/2023 4:31 PM Indications: pain Details: 22 G 1.5 in needle, anterolateral approach  Arthrogram: No  Medications: 5 mL lidocaine 1 %; 40 mg  methylPREDNISolone acetate 40 MG/ML Aspirate: 15 mL yellow Outcome: tolerated well, no immediate complications Procedure, treatment alternatives, risks and benefits explained, specific risks discussed. Consent was given by the patient. Immediately prior to procedure a time out was called to verify the correct patient, procedure, equipment, support staff and site/side marked as required. Patient was prepped and draped in the usual sterile fashion.

## 2023-04-15 ENCOUNTER — Encounter: Payer: Self-pay | Admitting: Obstetrics and Gynecology

## 2023-04-15 ENCOUNTER — Ambulatory Visit: Payer: PPO | Admitting: Obstetrics and Gynecology

## 2023-04-15 VITALS — BP 137/80 | HR 94

## 2023-04-15 DIAGNOSIS — N3281 Overactive bladder: Secondary | ICD-10-CM | POA: Diagnosis not present

## 2023-04-15 DIAGNOSIS — N393 Stress incontinence (female) (male): Secondary | ICD-10-CM

## 2023-04-15 NOTE — Patient Instructions (Signed)
Stop trospium for about 2 weeks. Then we will see if you get the urge back to go to the bathroom.

## 2023-04-15 NOTE — Progress Notes (Signed)
[facility]  Date of Visit: 04/15/2023  History of Present Illness: Ms. Flecker is a 82 y.o. female with mixed incontinence, urge predominant.  s/p cystoscopy with intravesical botox (100 units) on 03/12/23- in office  Has been using vaginal estrogen and clobetasol on the vulva.   She has to wear a diaper and two pads and is leaking through it. She feels like this is worse since having the botox. She does not have an urge, it just comes out. Sometimes when she is just sitting, but usually with standing up. Leaks a lot at night. She does not have any urge to go to the bathroom- most of the time she just leaks. Still taking the trospium twice a day. Feels the botox worked better for her before  Previously confirmed SUI with large volume leakage on CST.   Medications: She has a current medication list which includes the following prescription(s): acetic acid-hydrocortisone, artificial tear ointment, baclofen, benzonatate, buspirone, cetirizine, clobetasol prop emollient base, d-mannose, duloxetine, enalapril, estradiol, famotidine, fexofenadine, fluticasone, onetouch verio, onetouch delica plus lancet33g, metoprolol succinate, multivitamin with minerals, ondansetron, oxycodone-acetaminophen, pantoprazole, rosuvastatin, ozempic (1 mg/dose), triamcinolone ointment, and trospium.   Allergies: Patient is allergic to amoxicillin, azithromycin, butalbital, clindamycin hcl, januvia [sitagliptin], levaquin [levofloxacin in d5w], meperidine hcl, sulfamethoxazole, fexofenadine hcl, ketorolac, macrodantin [nitrofurantoin macrocrystal], methocarbamol, tramadol, codeine phosphate, etodolac, gabapentin, naproxen, pentazocine-naloxone hcl, sumatriptan, trimethoprim, and zonegran.   Physical Exam: There were no vitals taken for this visit.  Gen: No distress  PVR- 20ml   Assessment and Plan:  1. Overactive bladder   2. SUI (stress urinary incontinence, female)     - Stop trospium as this may be  contributing to decreased urge sensation with botox. She will stop for a few weeks then let us know what her symptoms are like. Does not appear to be retaining based on PVR today.  - For SUI, had previously planned on urethral bulking and it was cancelled when she was sick. Will plan to do this in the office.  We discussed success rate of approximately 70-80% and possible need for second injection. We reviewed that this is not a permanent procedure and the Bulkamid does dissolve over time. Risks reviewed including injury to bladder or urethra, UTI, urinary retention and hematuria.   Return for urethral bulking  Marguerita Beards, MD

## 2023-04-16 ENCOUNTER — Telehealth: Payer: Self-pay | Admitting: Physician Assistant

## 2023-04-16 NOTE — Telephone Encounter (Signed)
None urgent for this one

## 2023-04-16 NOTE — Telephone Encounter (Signed)
Patient was referred here by primary office for Closed fracture of nasal bone, initial encounter. Does this need to be urgent or next available?

## 2023-04-16 NOTE — Telephone Encounter (Signed)
Scheduled

## 2023-04-23 ENCOUNTER — Ambulatory Visit: Payer: PPO | Admitting: Physician Assistant

## 2023-04-23 DIAGNOSIS — M1712 Unilateral primary osteoarthritis, left knee: Secondary | ICD-10-CM | POA: Diagnosis not present

## 2023-04-23 NOTE — Progress Notes (Signed)
HPI: [name] returns today follow-up of her left knee.  She underwent aspiration and injection of the knee on 04/09/2023.  States that this helped.  Again she had a fall onto her knee 3 weeks ago.  During that fall she also broke her nose and is due to see ENT.  She states the injection in the knee helped for about a week.  Now she is having 8 out of 10 pain in the knee and feels the knee is giving way.  She does have arthritic changes of the left knee with valgus malalignment.  No acute fractures were seen on those films.  Review of systems: See HPI otherwise negative  Physical exam: General Well-developed well-nourished female who ambulates with a rollator. Left leg no abnormal warmth erythema or effusion.  Significant ecchymosis medial side of the ecchymosis down into the mid calf on the lateral aspect.  Varus malalignment.  No instability with valgus varus stressing.  Patellofemoral crepitus with range of motion.  Impression: Left knee osteoarthritis Left knee acute pain  Plan: She is given a hinged knee brace.  Will see her back in 6 weeks see how she is doing overall.  Questions were encouraged and answered.  I did discuss with her that due to the acute knee injury she most likely has a bony contusion and this can take up to 3 months to resolve.  She may need repeat supplemental injection in the left knee as she has had these in the past.  Questions were encouraged and answered at length.  Follow-up with Dr. Magnus Ivan in 6 weeks.

## 2023-04-28 DIAGNOSIS — M5116 Intervertebral disc disorders with radiculopathy, lumbar region: Secondary | ICD-10-CM | POA: Diagnosis not present

## 2023-05-01 ENCOUNTER — Other Ambulatory Visit: Payer: Self-pay

## 2023-05-01 DIAGNOSIS — G8929 Other chronic pain: Secondary | ICD-10-CM

## 2023-05-01 DIAGNOSIS — M159 Polyosteoarthritis, unspecified: Secondary | ICD-10-CM

## 2023-05-04 ENCOUNTER — Ambulatory Visit: Payer: PPO | Admitting: Physical Therapy

## 2023-05-04 DIAGNOSIS — M5416 Radiculopathy, lumbar region: Secondary | ICD-10-CM | POA: Diagnosis not present

## 2023-05-04 MED ORDER — OXYCODONE-ACETAMINOPHEN 5-325 MG PO TABS
0.5000 | ORAL_TABLET | Freq: Four times a day (QID) | ORAL | 0 refills | Status: DC | PRN
Start: 2023-05-04 — End: 2023-06-01

## 2023-05-07 NOTE — Telephone Encounter (Signed)
Called patient and scheduled appointment.   Thanks [employer]

## 2023-05-17 ENCOUNTER — Encounter (HOSPITAL_BASED_OUTPATIENT_CLINIC_OR_DEPARTMENT_OTHER): Payer: Self-pay

## 2023-05-17 ENCOUNTER — Other Ambulatory Visit: Payer: Self-pay

## 2023-05-17 ENCOUNTER — Emergency Department (HOSPITAL_BASED_OUTPATIENT_CLINIC_OR_DEPARTMENT_OTHER): Payer: PPO | Admitting: Radiology

## 2023-05-17 ENCOUNTER — Emergency Department (HOSPITAL_BASED_OUTPATIENT_CLINIC_OR_DEPARTMENT_OTHER)
Admission: EM | Admit: 2023-05-17 | Discharge: 2023-05-18 | Disposition: A | Discharge status: Home / Self Care | Payer: PPO | Attending: Emergency Medicine | Admitting: Emergency Medicine

## 2023-05-17 DIAGNOSIS — Z1152 Encounter for screening for COVID-19: Secondary | ICD-10-CM | POA: Insufficient documentation

## 2023-05-17 DIAGNOSIS — Z794 Long term (current) use of insulin: Secondary | ICD-10-CM | POA: Diagnosis not present

## 2023-05-17 DIAGNOSIS — E1165 Type 2 diabetes mellitus with hyperglycemia: Secondary | ICD-10-CM | POA: Insufficient documentation

## 2023-05-17 DIAGNOSIS — N3000 Acute cystitis without hematuria: Secondary | ICD-10-CM | POA: Insufficient documentation

## 2023-05-17 DIAGNOSIS — R739 Hyperglycemia, unspecified: Secondary | ICD-10-CM | POA: Diagnosis not present

## 2023-05-17 DIAGNOSIS — R059 Cough, unspecified: Secondary | ICD-10-CM | POA: Diagnosis not present

## 2023-05-17 DIAGNOSIS — R0600 Dyspnea, unspecified: Secondary | ICD-10-CM | POA: Diagnosis not present

## 2023-05-17 LAB — RESP PANEL BY RT-PCR (RSV, FLU A&B, COVID)  RVPGX2
Influenza A by PCR: NEGATIVE
Influenza B by PCR: NEGATIVE
Resp Syncytial Virus by PCR: NEGATIVE
SARS Coronavirus 2 by RT PCR: NEGATIVE

## 2023-05-17 LAB — CBC
HCT: 40.4 % (ref 36.0–46.0)
Hemoglobin: 13.3 g/dL (ref 12.0–15.0)
MCH: 29.7 pg (ref 26.0–34.0)
MCHC: 32.9 g/dL (ref 30.0–36.0)
MCV: 90.2 fL (ref 80.0–100.0)
Platelets: 131 10*3/uL — ABNORMAL LOW (ref 150–400)
RBC: 4.48 MIL/uL (ref 3.87–5.11)
RDW: 14.3 % (ref 11.5–15.5)
WBC: 7.2 10*3/uL (ref 4.0–10.5)
nRBC: 0 % (ref 0.0–0.2)

## 2023-05-17 LAB — URINALYSIS, ROUTINE W REFLEX MICROSCOPIC
Bilirubin Urine: NEGATIVE
Glucose, UA: >1000 mg/dL — AB
Hgb urine dipstick: NEGATIVE
Ketones, ur: NEGATIVE mg/dL
Nitrite: NEGATIVE
Specific Gravity, Urine: 1.027 (ref 1.005–1.030)
pH: 5.5 (ref 5.0–8.0)

## 2023-05-17 LAB — BASIC METABOLIC PANEL
Anion gap: 8 (ref 5–15)
BUN: 31 mg/dL — ABNORMAL HIGH (ref 8–23)
CO2: 25 mmol/L (ref 22–32)
Calcium: 9.7 mg/dL (ref 8.9–10.3)
Chloride: 99 mmol/L (ref 98–111)
Creatinine, Ser: 1.11 mg/dL — ABNORMAL HIGH (ref 0.44–1.00)
GFR, Estimated: 50 mL/min — ABNORMAL LOW (ref >60)
Glucose, Bld: 404 mg/dL — ABNORMAL HIGH (ref 70–99)
Potassium: 5.1 mmol/L (ref 3.5–5.1)
Sodium: 132 mmol/L — ABNORMAL LOW (ref 135–145)

## 2023-05-17 LAB — CBG MONITORING, ED: Glucose-Capillary: 405 mg/dL — ABNORMAL HIGH (ref 70–99)

## 2023-05-17 MED ORDER — PIOGLITAZONE HCL 15 MG PO TABS: 15.0000 mg | ORAL_TABLET | Freq: Every day | ORAL | 2 refills | Status: DC

## 2023-05-17 MED ORDER — INSULIN ASPART 100 UNIT/ML IJ SOLN
8.0000 [IU] | INTRAMUSCULAR | Status: AC
  Administered 2023-05-17: 8 [IU] via SUBCUTANEOUS

## 2023-05-17 MED ORDER — FLUCONAZOLE 150 MG PO TABS: 150.0000 mg | ORAL_TABLET | Freq: Once | ORAL | 0 refills | Status: AC

## 2023-05-17 MED ORDER — SODIUM CHLORIDE 0.9 % IV SOLN
1.0000 g | Freq: Once | INTRAVENOUS | Status: AC
  Administered 2023-05-17: 1 g via INTRAVENOUS
  Filled 2023-05-17: qty 10

## 2023-05-17 MED ORDER — CEFDINIR 300 MG PO CAPS: 300.0000 mg | ORAL_CAPSULE | Freq: Two times a day (BID) | ORAL | 0 refills | Status: AC

## 2023-05-17 MED ORDER — ONDANSETRON HCL 4 MG/2ML IJ SOLN
4.0000 mg | Freq: Once | INTRAMUSCULAR | Status: AC
  Administered 2023-05-17: 4 mg via INTRAVENOUS
  Filled 2023-05-17: qty 2

## 2023-05-17 MED ORDER — ONDANSETRON 4 MG PO TBDP: 4.0000 mg | ORAL_TABLET | Freq: Three times a day (TID) | ORAL | 0 refills | Status: DC | PRN

## 2023-05-17 NOTE — ED Provider Notes (Signed)
11:36 PM Assumed care from Dr. Eloise Harman, please see their note for full history, physical and decision making until this point. In brief this is a 82 y.o. year old female who presented to the ED tonight with Hyperglycemia     Hyperglycemia s/p insulin/oral fluids pending recheck of cbg. Also with UTI.   Discharge instructions, including strict return precautions for new or worsening symptoms, given. Patient and/or family verbalized understanding and agreement with the plan as described.   Labs, studies and imaging reviewed by myself and considered in medical decision making if ordered. Imaging interpreted by radiology.  Labs Reviewed  BASIC METABOLIC PANEL - Abnormal; Notable for the following components:      Result Value   Sodium 132 (*)    Glucose, Bld 404 (*)    BUN 31 (*)    Creatinine, Ser 1.11 (*)    GFR, Estimated 50 (*)    All other components within normal limits  CBC - Abnormal; Notable for the following components:   Platelets 131 (*)    All other components within normal limits  URINALYSIS, ROUTINE W REFLEX MICROSCOPIC - Abnormal; Notable for the following components:   APPearance HAZY (*)    Glucose, UA >1,000 (*)    Protein, ur TRACE (*)    Leukocytes,Ua SMALL (*)    Bacteria, UA RARE (*)    All other components within normal limits  CBG MONITORING, ED - Abnormal; Notable for the following components:   Glucose-Capillary 405 (*)    All other components within normal limits  RESP PANEL BY RT-PCR (RSV, FLU A&B, COVID)  [id number]  URINE CULTURE  CBG MONITORING, ED    DG Chest 2 View  Final Result      No follow-ups on file.

## 2023-05-17 NOTE — ED Provider Notes (Signed)
[place] EMERGENCY DEPARTMENT AT [facility] Provider Note   CSN: [id number] Arrival date & time: 05/17/23  1924     History {Add pertinent medical, surgical, social history, OB history to HPI:1} Chief Complaint  Patient presents with   Hyperglycemia    [name] is a 82 y.o. female.  82 year old female with a history of diabetes on Ozempic and chronic back pain who presents to the emergency department with elevated blood sugars.  Patient reports that on 9/17 she was given a steroid shot in her back.  After that blood sugars have been somewhat higher than usual in the 200s.  Today her blood sugar was 390 at home and so she decided to come into the emergency department for evaluation.  Also has been having a mild shortness of breath and cough.  Says that sometimes she has difficulty concentrating as well.  Does have increased thirst.  Unsure about urinary frequency.  Nausea but no vomiting.  Takes Ozempic weekly for blood sugar 1 mg.  Typically takes on Tuesdays but administered it to herself today because of her blood sugar.       Home Medications Prior to Admission medications   Medication Sig Start Date End Date Taking? Authorizing Provider  acetic acid-hydrocortisone (VOSOL-HC) OTIC solution Place 3 drops into both ears 2 (two) times daily. 02/28/21   Moses Manners, MD  Artificial Tear Ointment (DRY EYES OP) Apply 1 drop to eye 2 (two) times daily as needed (dry eyes).    [provider]  baclofen (LIORESAL) 20 MG tablet Take 1 tablet (20 mg total) by mouth 3 (three) times daily. 03/30/23   Caro Laroche, DO  benzonatate (TESSALON PERLES) 100 MG capsule Take 2 capsules (200 mg total) by mouth 3 (three) times daily as needed for cough. 12/22/22   Sabino Dick, DO  busPIRone (BUSPAR) 5 MG tablet Take 1 tablet (5 mg total) by mouth 3 (three) times daily. 09/24/22   Moses Manners, MD  cetirizine (ZYRTEC) 5 MG tablet Take 1 tablet (5 mg total) by  mouth daily as needed for allergies. 04/08/23   Bess Kinds, MD  Clobetasol Prop Emollient Base (CLOBETASOL PROPIONATE E) 0.05 % emollient cream Apply 2 Applications topically at bedtime. 11/10/22   Selmer Dominion, NP  D-Mannose 500 MG CAPS Take 1 capsule by mouth daily. 02/23/23   Selmer Dominion, NP  DULoxetine (CYMBALTA) 30 MG capsule TAKE ONE CAPSULE BY MOUTH DAILY 09/25/22   Moses Manners, MD  enalapril (VASOTEC) 10 MG tablet Take 1 tablet (10 mg total) by mouth daily. 12/12/22   Latrelle Dodrill, MD  estradiol (ESTRACE) 0.1 MG/GM vaginal cream Place 0.5g nightly for two weeks then twice a week after 03/13/22   Marguerita Beards, MD  famotidine (PEPCID) 40 MG tablet Take 1 tablet (40 mg total) by mouth daily. 05/08/22   Moses Manners, MD  fexofenadine (ALLEGRA) 180 MG tablet Take 1 tablet (180 mg total) by mouth daily. 08/18/22   Moses Manners, MD  fluticasone (FLONASE) 50 MCG/ACT nasal spray Place 2 sprays into both nostrils daily. 04/08/23   Bess Kinds, MD  glucose blood (ONETOUCH VERIO) test strip USE  STRIP TO CHECK GLUCOSE TWICE DAILY AS DIRECTED. E11.9 12/19/22   Latrelle Dodrill, MD  Lancets [facility] DELICA PLUS LANCET33G) MISC USE TO CHECK GLUCOSE IN THE MORNING AND AT BEDTIME. E11.9 02/06/23   Latrelle Dodrill, MD  metoprolol succinate (TOPROL-XL) 25 MG 24 hr tablet TAKE  ONE TABLET BY MOUTH DAILY 08/07/22   Moses Manners, MD  Multiple Vitamin (MULTIVITAMIN WITH MINERALS) TABS tablet Take 1 tablet by mouth daily.    [provider]  ondansetron (ZOFRAN) 4 MG tablet Take 1 tablet (4 mg total) by mouth every 8 (eight) hours as needed for nausea or vomiting. 02/28/21   Moses Manners, MD  oxyCODONE-acetaminophen (PERCOCET/ROXICET) 5-325 MG tablet Take 0.5 tablets by mouth every 6 (six) hours as needed for severe pain. 05/04/23   Caro Laroche, DO  pantoprazole (PROTONIX) 40 MG tablet TAKE ONE TABLET BY MOUTH DAILY 09/15/22   Moses Manners, MD   rosuvastatin (CRESTOR) 20 MG tablet Take 1 tablet (20 mg total) by mouth daily. 03/27/23   Caro Laroche, DO  Semaglutide, 1 MG/DOSE, (OZEMPIC, 1 MG/DOSE,) 2 MG/1.5ML SOPN Inject 2 mg into the skin once a week. Patient taking differently: Inject 1 mg into the skin every Sunday. 01/14/21   Moses Manners, MD  triamcinolone ointment (KENALOG) 0.5 % Apply 1 application topically 2 (two) times daily. For elbows Patient taking differently: Apply 1 application  topically 2 (two) times daily as needed (elbow itching). 10/10/19   Moses Manners, MD  trospium (SANCTURA) 20 MG tablet TAKE 1 TABLET BY MOUTH 2 TIMES A DAY 03/20/23   Selmer Dominion, NP      Allergies    Amoxicillin, Azithromycin, Butalbital, Clindamycin hcl, Januvia [sitagliptin], Levaquin [levofloxacin in d5w], Meperidine hcl, Sulfamethoxazole, Fexofenadine hcl, Ketorolac, Macrodantin [nitrofurantoin macrocrystal], Methocarbamol, Tramadol, Codeine phosphate, Etodolac, Gabapentin, Naproxen, Pentazocine-naloxone hcl, Sumatriptan, Trimethoprim, and Zonegran    Review of Systems   Review of Systems  Physical Exam Updated Vital Signs BP (!) 144/78 (BP Location: Right Arm)   Pulse (!) 102   Temp 97.7 F (36.5 C) (Temporal)   Resp 20   Ht 5' (1.524 m)   Wt 77.1 kg   SpO2 94%   BMI 33.20 kg/m  Physical Exam Vitals and nursing note reviewed.  Constitutional:      General: She is not in acute distress.    Appearance: She is well-developed.  HENT:     Head: Normocephalic and atraumatic.     Right Ear: External ear normal.     Left Ear: External ear normal.     Nose: Nose normal.  Eyes:     Extraocular Movements: Extraocular movements intact.     Conjunctiva/sclera: Conjunctivae normal.     Pupils: Pupils are equal, round, and reactive to light.  Cardiovascular:     Rate and Rhythm: Normal rate and regular rhythm.     Heart sounds: No murmur heard. Pulmonary:     Effort: Pulmonary effort is normal. No respiratory  distress.     Breath sounds: Normal breath sounds.  Abdominal:     General: Abdomen is flat. There is no distension.     Palpations: Abdomen is soft. There is no mass.     Tenderness: There is no abdominal tenderness. There is no guarding.  Musculoskeletal:     Cervical back: Normal range of motion and neck supple.  Skin:    General: Skin is warm and dry.  Neurological:     Mental Status: She is alert and oriented to person, place, and time. Mental status is at baseline.  Psychiatric:        Mood and Affect: Mood normal.     ED Results / Procedures / Treatments   Labs (all labs ordered are listed, but only abnormal results are  displayed) Labs Reviewed  CBG MONITORING, ED - Abnormal; Notable for the following components:      Result Value   Glucose-Capillary 405 (*)    All other components within normal limits  BASIC METABOLIC PANEL  CBC  URINALYSIS, ROUTINE W REFLEX MICROSCOPIC    EKG None  Radiology No results found.  Procedures Procedures  {Document cardiac monitor, telemetry assessment procedure when appropriate:1}  Medications Ordered in ED Medications - No data to display  ED Course/ Medical Decision Making/ A&P Clinical Course as of 05/17/23 2004  Sun May 17, 2023  1943 Cough, runny nose, sob [RP]    Clinical Course User Index [RP] Rondel Baton, MD   {   Click here for ABCD2, HEART and other calculatorsREFRESH Note before signing :1}                              Medical Decision Making Amount and/or Complexity of Data Reviewed Labs: ordered. Radiology: ordered.   ***  {Document critical care time when appropriate:1} {Document review of labs and clinical decision tools ie heart score, Chads2Vasc2 etc:1}  {Document your independent review of radiology images, and any outside records:1} {Document your discussion with family members, caretakers, and with consultants:1} {Document social determinants of health affecting pt's care:1} {Document  your decision making why or why not admission, treatments were needed:1} Final Clinical Impression(s) / ED Diagnoses Final diagnoses:  None    Rx / DC Orders ED Discharge Orders     None

## 2023-05-17 NOTE — Discharge Instructions (Addendum)
You were seen for your urinary tract infection and high blood sugar in the emergency department.   At home, please take the pioglitazone and the ozempic for your blood sugar.  Take the [facility] for your urinary tract infection and the fluconazole for your yeast infection.  Check your MyChart online for the results of any tests that had not resulted by the time you left the emergency department.   Follow-up with your primary doctor in 2-3 days regarding your visit.    Return immediately to the emergency department if you experience any of the following: Fevers, shortness of breath, or any other concerning symptoms.    Thank you for visiting our Emergency Department. It was a pleasure taking care of you today.

## 2023-05-17 NOTE — ED Triage Notes (Signed)
Pt presents with hyperglycemia after having a steroid shot 1.5 weeks ago. Pt states he blood sugar has been elevated and tonight got as high as 390. Pt typically takes only Ozempic for her blood sugar.

## 2023-05-18 LAB — CBG MONITORING, ED: Glucose-Capillary: 223 mg/dL — ABNORMAL HIGH (ref 70–99)

## 2023-05-18 NOTE — ED Notes (Signed)
Patient denies pain and is resting comfortably.  

## 2023-05-21 LAB — URINE CULTURE: Culture: >=100000 — AB

## 2023-05-22 ENCOUNTER — Encounter: Payer: Self-pay | Admitting: Family Medicine

## 2023-05-22 ENCOUNTER — Telehealth (HOSPITAL_BASED_OUTPATIENT_CLINIC_OR_DEPARTMENT_OTHER): Payer: Self-pay | Admitting: *Deleted

## 2023-05-22 ENCOUNTER — Ambulatory Visit: Payer: PPO | Admitting: Family Medicine

## 2023-05-22 VITALS — BP 142/90 | HR 94 | Ht 59.0 in | Wt 180.0 lb

## 2023-05-22 DIAGNOSIS — I7143 Infrarenal abdominal aortic aneurysm, without rupture: Secondary | ICD-10-CM | POA: Diagnosis not present

## 2023-05-22 DIAGNOSIS — M48062 Spinal stenosis, lumbar region with neurogenic claudication: Secondary | ICD-10-CM

## 2023-05-22 DIAGNOSIS — Z7985 Long-term (current) use of injectable non-insulin antidiabetic drugs: Secondary | ICD-10-CM

## 2023-05-22 DIAGNOSIS — E118 Type 2 diabetes mellitus with unspecified complications: Secondary | ICD-10-CM

## 2023-05-22 DIAGNOSIS — Z23 Encounter for immunization: Secondary | ICD-10-CM

## 2023-05-22 DIAGNOSIS — E7800 Pure hypercholesterolemia, unspecified: Secondary | ICD-10-CM | POA: Diagnosis not present

## 2023-05-22 DIAGNOSIS — I10 Essential (primary) hypertension: Secondary | ICD-10-CM | POA: Diagnosis not present

## 2023-05-22 DIAGNOSIS — G894 Chronic pain syndrome: Secondary | ICD-10-CM

## 2023-05-22 LAB — POCT GLYCOSYLATED HEMOGLOBIN (HGB A1C): HbA1c, POC (controlled diabetic range): 9.8 % — AB (ref 0.0–7.0)

## 2023-05-22 NOTE — Progress Notes (Signed)
ED Antimicrobial Stewardship Positive Culture Follow Up   [name] is an 82 y.o. female who presented to [facility] on 05/17/2023 with a chief complaint of  Chief Complaint  Patient presents with   Hyperglycemia    Recent Results (from the past 720 hour(s))  Resp panel by RT-PCR (RSV, Flu A&B, Covid) Anterior Nasal Swab     Status: None   Collection Time: 05/17/23  7:55 PM   Specimen: Anterior Nasal Swab  Result Value Ref Range Status   SARS Coronavirus 2 by RT PCR NEGATIVE NEGATIVE Final    Comment: (NOTE) SARS-CoV-2 target nucleic acids are NOT DETECTED.  The SARS-CoV-2 RNA is generally detectable in upper respiratory specimens during the acute phase of infection. The lowest concentration of SARS-CoV-2 viral copies this assay can detect is 138 copies/mL. A negative result does not preclude SARS-Cov-2 infection and should not be used as the sole basis for treatment or other patient management decisions. A negative result may occur with  improper specimen collection/handling, submission of specimen other than nasopharyngeal swab, presence of viral mutation(s) within the areas targeted by this assay, and inadequate number of viral copies(<138 copies/mL). A negative result must be combined with clinical observations, patient history, and epidemiological information. The expected result is Negative.  Fact Sheet for Patients:  BloggerCourse.com  Fact Sheet for Healthcare Providers:  SeriousBroker.it  This test is no t yet approved or cleared by the [place] FDA and  has been authorized for detection and/or diagnosis of SARS-CoV-2 by FDA under an Emergency Use Authorization (EUA). This EUA will remain  in effect (meaning this test can be used) for the duration of the COVID-19 declaration under Section 564(b)(1) of the Act, 21 U.S.C.section 360bbb-3(b)(1), unless the authorization is terminated  or revoked sooner.        Influenza A by PCR NEGATIVE NEGATIVE Final   Influenza B by PCR NEGATIVE NEGATIVE Final    Comment: (NOTE) The Xpert Xpress SARS-CoV-2/FLU/RSV plus assay is intended as an aid in the diagnosis of influenza from Nasopharyngeal swab specimens and should not be used as a sole basis for treatment. Nasal washings and aspirates are unacceptable for Xpert Xpress SARS-CoV-2/FLU/RSV testing.  Fact Sheet for Patients: BloggerCourse.com  Fact Sheet for Healthcare Providers: SeriousBroker.it  This test is not yet approved or cleared by the [place] FDA and has been authorized for detection and/or diagnosis of SARS-CoV-2 by FDA under an Emergency Use Authorization (EUA). This EUA will remain in effect (meaning this test can be used) for the duration of the COVID-19 declaration under Section 564(b)(1) of the Act, 21 U.S.C. section 360bbb-3(b)(1), unless the authorization is terminated or revoked.     Resp Syncytial Virus by PCR NEGATIVE NEGATIVE Final    Comment: (NOTE) Fact Sheet for Patients: BloggerCourse.com  Fact Sheet for Healthcare Providers: SeriousBroker.it  This test is not yet approved or cleared by the [place] FDA and has been authorized for detection and/or diagnosis of SARS-CoV-2 by FDA under an Emergency Use Authorization (EUA). This EUA will remain in effect (meaning this test can be used) for the duration of the COVID-19 declaration under Section 564(b)(1) of the Act, 21 U.S.C. section 360bbb-3(b)(1), unless the authorization is terminated or revoked.  Performed at [facility], [address], [place], [place] [address]   Urine Culture     Status: Abnormal   Collection Time: 05/17/23  7:55 PM   Specimen: Urine, Clean Catch  Result Value Ref Range Status   Specimen Description  Final    URINE, CLEAN CATCH Performed at [facility], [address] [place], [place] [address]    Special Requests   Final    NONE Performed at [facility], [address], [place], [place] [address]    Culture (A)  Final    >=100,000 COLONIES/mL KLEBSIELLA PNEUMONIAE 60,000 COLONIES/mL ENTEROCOCCUS FAECIUM    Report Status 05/21/2023 FINAL  Final   Organism ID, Bacteria KLEBSIELLA PNEUMONIAE (A)  Final   Organism ID, Bacteria ENTEROCOCCUS FAECIUM (A)  Final      Susceptibility   Enterococcus faecium - MIC*    AMPICILLIN <=2 SENSITIVE Sensitive     NITROFURANTOIN 64 INTERMEDIATE Intermediate     VANCOMYCIN <=0.5 SENSITIVE Sensitive     * 60,000 COLONIES/mL ENTEROCOCCUS FAECIUM   Klebsiella pneumoniae - MIC*    AMPICILLIN RESISTANT Resistant     CEFAZOLIN <=4 SENSITIVE Sensitive     CEFEPIME <=0.12 SENSITIVE Sensitive     CEFTRIAXONE <=0.25 SENSITIVE Sensitive     CIPROFLOXACIN <=0.25 SENSITIVE Sensitive     GENTAMICIN <=1 SENSITIVE Sensitive     IMIPENEM <=0.25 SENSITIVE Sensitive     NITROFURANTOIN <=16 SENSITIVE Sensitive     TRIMETH/SULFA <=20 SENSITIVE Sensitive     AMPICILLIN/SULBACTAM 4 SENSITIVE Sensitive     PIP/TAZO <=4 SENSITIVE Sensitive ug/mL    * >=100,000 COLONIES/mL KLEBSIELLA PNEUMONIAE    82 yo female presenting with hyperglycemia. No signs of urinary symptoms present. UA with small leukocytes, 11-20 WBC, rare bacteria, 11-20 squamous cells. With no urinary or systemic signs of infection, likely not a true UTI. Can discontinue antibiotics.   ED Provider: Maxwell Marion, PA   [name] 05/22/2023, 8:21 AM Clinical Pharmacist Monday - Friday phone -  [phone] Saturday - Sunday phone - [phone]

## 2023-05-22 NOTE — Patient Instructions (Addendum)
It was great to see you!  Our plans for today:  - Take the pioglitazone once daily for the next few weeks. Continue to take your ozempic as you have been doing. Keep your blood sugar log and bring with you to your next appointment.  - Keep your appointment with the ENT.  - No changes to your blood pressure medications.   We are checking some labs today, we will release these results to your MyChart.  Take care and seek immediate care sooner if you develop any concerns.   Dr. Linwood Dibbles

## 2023-05-22 NOTE — Progress Notes (Signed)
   SUBJECTIVE:   CHIEF COMPLAINT / HPI:   Hypertension: - Medications: enalapril, metoprolol - Compliance: good, tolerating well - Checking BP at home: yes, unsure of measurements  Diabetes, Type 2 - recent ED visit 10/06 for hyperglycemia. BG 464, states was due to recent steroid shot. With UTI, resolved with cefdinir. - Last A1c 6.1 09/2022 - Medications: actos (started at ED visit), ozempic 1mg  - Compliance: not taking actos, only took twice. Thinks she tolerated ok when took it. - feeling more not like herself since the shot, more confusion - Checking BG at home: yes, elevated recently due to steroid shot 9/17. Typically 100-190s. This morning 204. Has been upwards of 400s. - Eye exam: UTD, Dr. Lucretia Roers - Foot exam: due - Microalbumin: UTD - Statin: yes  HLD - medications: crestor - compliance: good - medication SEs: none  Chronic pain 2/2 OA, DDD - oxycodone 5-325 0.5 tablet q6h prn. Chronic back pain. MRI 01/2023 mod-severe spinal stenosis with compression of R L5 nerve root, worse from prior. Follows with Ortho for ESI. Pain worse in the morning. Feels oxycodone is able to keep her functional, doesn't think she would be able to walk without it. Only takes as needed.  Previous fall - Going to see ENT next Friday. Not having congestion since starting flonase. Thinks she tripped over sprinkler. Using walker since fall.    OBJECTIVE:   BP (!) 142/90   Pulse 94   Ht 4\' 11"  (1.499 m)   Wt 180 lb (81.6 kg)   SpO2 99%   BMI 36.36 kg/m   Gen: elderly, well appearing, in NAD Card: RRR Lungs: CTAB Ext: WWP, no edema. Bruising to L knee much improved from prior.    ASSESSMENT/PLAN:   Hypertension At goal for age, no changes.   Diabetes mellitus type 2, controlled, with complications (HCC) Uncontrolled likely due to recent CSI. A1c today 9.8. confusion and "feeling off" likely 2/2 to this. Recommend taking the actos given by ED consistently over the next 2 weeks and return  for f/u at that time with BG log. If at that time still not controlled, recommend considering increase ozempic or adding SGLT2 inhibitor however don't anticipate needing additional control chronically given previously well controlled on ozempic monotherapy.  HYPERCHOLESTEROLEMIA Tolerant of statin, continue.  Chronic pain syndrome Functional with chronic narcotics and low risk of abuse, continue. PDMP reviewed, appropriate. Continue to follow with pain management/ortho.    F/u 2 weeks.  Caro Laroche, DO

## 2023-05-22 NOTE — Telephone Encounter (Signed)
Post ED Visit - Positive Culture Follow-up  Culture report reviewed by antimicrobial stewardship pharmacist: [facility]  [x]  Dorris Fetch Pharm.D., BCPS AQ-ID []  Garvin Fila, Pharm.D., BCPS []  Georgina Pillion, [address].D., BCPS []  [place], [place].D., BCPS, AAHIVP []  Estella Husk, Pharm.D., BCPS, AAHIVP []  Lysle Pearl, PharmD, BCPS []  Phillips Climes, PharmD, BCPS []  Agapito Games, PharmD, BCPS []  Verlan Friends, PharmD []  Mervyn Gay, PharmD, BCPS []  Vinnie Level, PharmD  [facility] []  Len Childs, PharmD []  Greer Pickerel, PharmD []  Adalberto Cole, PharmD []  Perlie Gold, Rph []  Lonell Face) Jean Rosenthal, PharmD []  Earl Many, PharmD []  Junita Push, PharmD []  Dorna Leitz, PharmD []  Terrilee Files, PharmD []  Lynann Beaver, PharmD []  Keturah Barre, PharmD []  Loralee Pacas, PharmD []  Bernadene Person, PharmD   Positive urine culture No signs of UTI present, no treatment needed.  Left message to stop antibiotics and  no further patient follow-up is required at this time.  Virl Axe [facility] 05/22/2023, 8:52 AM

## 2023-05-22 NOTE — Progress Notes (Signed)
PA request was faxed to [facility] for procedure : Urethral Bulking CPT code: 28413 and [phone] PA is needed for this procedure. PA request faxed and is PENDING

## 2023-05-23 DIAGNOSIS — I714 Abdominal aortic aneurysm, without rupture, unspecified: Secondary | ICD-10-CM | POA: Insufficient documentation

## 2023-05-23 LAB — BASIC METABOLIC PANEL
BUN/Creatinine Ratio: 18 (ref 12–28)
BUN: 20 mg/dL (ref 8–27)
CO2: 22 mmol/L (ref 20–29)
Calcium: 9.9 mg/dL (ref 8.7–10.3)
Chloride: 96 mmol/L (ref 96–106)
Creatinine, Ser: 1.13 mg/dL — ABNORMAL HIGH (ref 0.57–1.00)
Glucose: 204 mg/dL — ABNORMAL HIGH (ref 70–99)
Potassium: 4.8 mmol/L (ref 3.5–5.2)
Sodium: 137 mmol/L (ref 134–144)
eGFR: 49 mL/min/{1.73_m2} — ABNORMAL LOW (ref >59)

## 2023-05-23 NOTE — Assessment & Plan Note (Signed)
Uncontrolled likely due to recent CSI. A1c today 9.8. confusion and "feeling off" likely 2/2 to this. Recommend taking the actos given by ED consistently over the next 2 weeks and return for f/u at that time with BG log. If at that time still not controlled, recommend considering increase ozempic or adding SGLT2 inhibitor however don't anticipate needing additional control chronically given previously well controlled on ozempic monotherapy.

## 2023-05-23 NOTE — Assessment & Plan Note (Signed)
Functional with chronic narcotics and low risk of abuse, continue. PDMP reviewed, appropriate. Continue to follow with pain management/ortho.

## 2023-05-23 NOTE — Assessment & Plan Note (Signed)
Tolerant of statin, continue.

## 2023-05-23 NOTE — Assessment & Plan Note (Signed)
At goal for age, no changes.

## 2023-05-25 ENCOUNTER — Ambulatory Visit: Payer: PPO | Admitting: Student

## 2023-05-25 ENCOUNTER — Encounter: Payer: Self-pay | Admitting: Student

## 2023-05-25 ENCOUNTER — Telehealth: Payer: Self-pay

## 2023-05-25 ENCOUNTER — Other Ambulatory Visit: Payer: Self-pay

## 2023-05-25 VITALS — BP 143/75 | HR 92 | Ht 59.0 in | Wt 180.4 lb

## 2023-05-25 DIAGNOSIS — E118 Type 2 diabetes mellitus with unspecified complications: Secondary | ICD-10-CM

## 2023-05-25 DIAGNOSIS — M546 Pain in thoracic spine: Secondary | ICD-10-CM | POA: Diagnosis not present

## 2023-05-25 DIAGNOSIS — Z7985 Long-term (current) use of injectable non-insulin antidiabetic drugs: Secondary | ICD-10-CM

## 2023-05-25 NOTE — Assessment & Plan Note (Signed)
>>  ASSESSMENT AND PLAN FOR ACUTE RIGHT-SIDED THORACIC BACK PAIN WRITTEN ON 05/25/2023  5:28 PM BY Bess Kinds, MD  Patient notes sudden onset of intense back pain following starting pioglitazone.  Patient notes pain is severe, and she stopped taking the medication.  Patient back exam with tenderness to palpation, and right paraspinal thoracic area, decreased range of motion secondary to pain.  It appears that back pain is a side effect of this medication, will discontinue this medication, and add to allergy list.  Patient reports her sugars around/under 200s, will not start new antihyperglycemic at this time.  Recommend over-the-counter pain relief. - Voltaren gel for back pain - DC pioglitazone - Follow-up with Dr. Linwood Dibbles in 2 weeks - Patient to message Dr. Linwood Dibbles if sugars above 200

## 2023-05-25 NOTE — Assessment & Plan Note (Signed)
Patient was started on pioglitazone for elevated sugars follow-up in steroid injection and back.  Sugars were initially elevated but have now come down to 200s and lower.  Patient also having side effect of back pain secondary to pioglitazone.  Pioglitazone is thus been discontinued.  Given that sugars are around/under 200s, no new antihyperglycemic started.  Plan for patient to follow-up in 2 weeks and to message PCP if sugars above 200. - Follow-up 2 weeks - Message PCP if sugars greater than 200

## 2023-05-25 NOTE — Assessment & Plan Note (Addendum)
Patient notes sudden onset of intense back pain following starting pioglitazone.  Patient notes pain is severe, and she stopped taking the medication.  Patient back exam with tenderness to palpation, and right paraspinal thoracic area, decreased range of motion secondary to pain.  It appears that back pain is a side effect of this medication, will discontinue this medication, and add to allergy list.  Patient reports her sugars around/under 200s, will not start new antihyperglycemic at this time.  Recommend over-the-counter pain relief. - Voltaren gel for back pain - DC pioglitazone - Follow-up with Dr. Linwood Dibbles in 2 weeks - Patient to message Dr. Linwood Dibbles if sugars above 200

## 2023-05-25 NOTE — Patient Instructions (Signed)
It was great to see you! Thank you for allowing me to participate in your care!  Our plans for today:   - Back Pain We are stopping the Pioglitazone, and will not be adding a medicine to your diabetes regimen, as your sugars are okay.   Use Zyrtec (Ceterizine) for the itching  Use Voltaren gel for the back pain   Follow up with Dr. Linwood Dibbles in 2 weeks  *If your sugars remain higher than 200, for 2 or more days, contact Dr. Linwood Dibbles and let her know. She may want to start you on a medicine early.     Take care and seek immediate care sooner if you develop any concerns.   Dr. Bess Kinds, MD [facility]

## 2023-05-25 NOTE — Telephone Encounter (Signed)
Patient calls nurse line reporting a possible reaction to Pioglitazone.   She reports she was given this medication on 10/6 at the ED. She reports she saw PCP on 10/11 and was told to continue taking the medicine.   She reports when she started it on 10/6 she started having lower back pain and incontinence issues, bowel and urine. She reports she could barely sleep last night due to the back pain.   She denies any abdominal pain, urinary frequency, hematuria, fevers, chills, nausea or vomiting.   Patient scheduled for this afternoon for evaluation.   Precautions discussed in the meantime.

## 2023-05-25 NOTE — Progress Notes (Signed)
  SUBJECTIVE:   CHIEF COMPLAINT / HPI:   Low Back Pain / Incontinence  Developed severe back pain after she started taking Pioglitazone. Note's she couldn't sleep at all as the pain was severe. She laid on the floor for help. No fever's or systemic symptoms. Has bladder incontinence at baseline. Note's that she fell a couple of weeks ago from tripping on sprinkler system. Fell face first. Pain in back is sharp and staying in the right thoracic area. Last time she took the med was 2 days ago.   PERTINENT  PMH / PSH:    OBJECTIVE:  BP (!) 143/75   Pulse 92   Ht 4\' 11"  (1.499 m)   Wt 180 lb 6.4 oz (81.8 kg)   SpO2 95%   BMI 36.44 kg/m  Physical Exam Musculoskeletal:     Cervical back: Normal.     Thoracic back: Tenderness present. No swelling, deformity, lacerations, spasms or bony tenderness. Decreased range of motion. No scoliosis.     Lumbar back: Normal.     Comments: Decreased ROM 2/2 pain      ASSESSMENT/PLAN:  Acute right-sided thoracic back pain Assessment & Plan: Patient notes sudden onset of intense back pain following starting pioglitazone.  Patient notes pain is severe, and she stopped taking the medication.  Patient back exam with tenderness to palpation, and right paraspinal thoracic area, decreased range of motion secondary to pain.  It appears that back pain is a side effect of this medication, will discontinue this medication, and add to allergy list.  Patient reports her sugars around/under 200s, will not start new antihyperglycemic at this time.  Recommend over-the-counter pain relief. - Voltaren gel for back pain - DC pioglitazone - Follow-up with Dr. Linwood Dibbles in 2 weeks - Patient to message Dr. Linwood Dibbles if sugars above 200   Controlled type 2 diabetes mellitus with complication, without long-term current use of insulin [facility]) Assessment & Plan: Patient was started on pioglitazone for elevated sugars follow-up in steroid injection and back.  Sugars were initially  elevated but have now come down to 200s and lower.  Patient also having side effect of back pain secondary to pioglitazone.  Pioglitazone is thus been discontinued.  Given that sugars are around/under 200s, no new antihyperglycemic started.  Plan for patient to follow-up in 2 weeks and to message PCP if sugars above 200. - Follow-up 2 weeks - Message PCP if sugars greater than 200    No follow-ups on file. Bess Kinds, MD 05/25/2023, 5:30 PM PGY-3, [facility]

## 2023-05-28 ENCOUNTER — Telehealth: Payer: Self-pay

## 2023-05-28 NOTE — Telephone Encounter (Signed)
Patient calls nurse line regarding issues with continued back pain.   She was seen in office on Monday, 10/14 with Dr. Barbaraann Faster for this concern.   She states that recommendations provided at this visit have not helped pain at all. She is still rating pain "higher than 10/10." States that pain is worse on the right side.   She also reports that instead of taking 0.5 tablet of hydrocodone-acetaminophen every 6 hours, she is having to take a whole tablet.   Will forward to Dr. Barbaraann Faster for further advisement.   Veronda Prude, RN

## 2023-05-28 NOTE — Telephone Encounter (Signed)
Patient called and scheduled for access to care 10/18

## 2023-05-29 ENCOUNTER — Ambulatory Visit (INDEPENDENT_AMBULATORY_CARE_PROVIDER_SITE_OTHER): Payer: PPO

## 2023-05-29 ENCOUNTER — Ambulatory Visit (INDEPENDENT_AMBULATORY_CARE_PROVIDER_SITE_OTHER): Payer: PPO | Admitting: Otolaryngology

## 2023-05-29 ENCOUNTER — Encounter (INDEPENDENT_AMBULATORY_CARE_PROVIDER_SITE_OTHER): Payer: Self-pay | Admitting: Otolaryngology

## 2023-05-29 VITALS — BP 144/80 | HR 84

## 2023-05-29 VITALS — BP 140/63 | HR 79 | Wt 177.2 lb

## 2023-05-29 DIAGNOSIS — E118 Type 2 diabetes mellitus with unspecified complications: Secondary | ICD-10-CM

## 2023-05-29 DIAGNOSIS — J3489 Other specified disorders of nose and nasal sinuses: Secondary | ICD-10-CM

## 2023-05-29 DIAGNOSIS — R0981 Nasal congestion: Secondary | ICD-10-CM

## 2023-05-29 DIAGNOSIS — G8929 Other chronic pain: Secondary | ICD-10-CM

## 2023-05-29 DIAGNOSIS — S022XXA Fracture of nasal bones, initial encounter for closed fracture: Secondary | ICD-10-CM

## 2023-05-29 DIAGNOSIS — J300 Vasomotor rhinitis: Secondary | ICD-10-CM | POA: Diagnosis not present

## 2023-05-29 DIAGNOSIS — Z7985 Long-term (current) use of injectable non-insulin antidiabetic drugs: Secondary | ICD-10-CM

## 2023-05-29 DIAGNOSIS — M546 Pain in thoracic spine: Secondary | ICD-10-CM | POA: Diagnosis not present

## 2023-05-29 DIAGNOSIS — S0993XA Unspecified injury of face, initial encounter: Secondary | ICD-10-CM

## 2023-05-29 MED ORDER — TIZANIDINE HCL 4 MG PO TABS
2.0000 mg | ORAL_TABLET | Freq: Two times a day (BID) | ORAL | 0 refills | Status: DC | PRN
Start: 2023-05-29 — End: 2023-08-28

## 2023-05-29 MED ORDER — MOMETASONE FUROATE 50 MCG/ACT NA SUSP
2.0000 | Freq: Two times a day (BID) | NASAL | 12 refills | Status: DC
Start: 2023-05-29 — End: 2024-04-04
  Filled 2024-03-30: qty 17, 30d supply, fill #0

## 2023-05-29 NOTE — Progress Notes (Unsigned)
    SUBJECTIVE:   CHIEF COMPLAINT / HPI:   [name] is a 82yo F w/ hx of spinal stenosis, OA, and chronic R thoracic back pain that p/w acute worsening on R back pain.  - On Monday or Tuesday, started to have R thoracic back that acutely worsened. - Denies recent trauma. Fell 7 weeks ago but did not have back pain at that time.  - Was seen 10/14 for similar back pain. Was thought to be potentially related to taking pioglitazone and pt has stopped taking. - The pain is very severe, not getting better. - Takes chronic oxy for chronic lower back and r thoracic back pain  - has been having to take higher doses (full tablet instead of half tablet)  - No weakness or R arm. No LE or UE weakness.  - She follows with Orthopedic surgery and gets steroid injections for this back pain.  - Discussed thoracic XR vs trialing tizanidine first, pt prefers to try tizanidine first and hold off on imaging until she sees ortho  T2DM - She was taking the pioglitazone for elevated glucose related to getting steroid injection - Has not had glucose readings >200 for this past week.   OBJECTIVE:   BP (!) 140/63   Pulse 79   Wt 177 lb 3.2 oz (80.4 kg)   SpO2 97%   BMI 35.79 kg/m   General: Alert, pleasant woman. NAD.Uses walker HEENT: NCAT. MMM. CV: RRR, no murmurs.  Resp: CTAB, no wheezing or crackles. Normal WOB on RA.  Abm: Soft, nontender, nondistended. BS present. Msk: Tenderness to palpation to general R thoracic back area. Normal active ROM on BL UE.  Skin: Warm, well perfused   ASSESSMENT/PLAN:   Chronic right-sided thoracic back pain Acute exacerbation of chronic R thoracic back pain. No recent trauma or trigger. Pt requiring increased doses of chronic percocet. Discussed getting imaging now vs f/u w/ her orthopedic surgeon, pt prefers to f/u with surgeon first. Will give short course tizanidine. - Tizanidine 2mg  BID, provided 15 day supply. This is short course.  Diabetes mellitus type 2,  controlled, with complications (HCC) Normally well-controlled, but has hyperglycemia after steroid injections for her back pain.  Discussed with patient that she may need short course of low dose insulin when she gets her steroid injections in the future.  Advised patient to reach out if she knows she is going to get a steroid injection.    Lincoln Brigham, MD [facility]

## 2023-05-29 NOTE — Progress Notes (Unsigned)
ENT CONSULT:  Reason for Consult: hx of fall and nasal bone fx   HPI: [name] is an 82 y.o. female with hx recent fall, approximately 7 weeks ago, here to be evaluated following a diagnosis of nasal bone fracture.  She has nasal congestion. On Zyrtec, stopped Flonase prescribed to her in the past.  Denies obvious nasal deformity.  Denies epistaxis.  She has been getting nasal drainage/clear rhinorrhea and was worried that it is concerning for CSF leak.  When she leans forward with her chin down no drainage from the nose.  Denies headache.  CT head in ED with evidence of age-indeterminate right nasal bone fracture.  No other facial fractures.  She denies salty or metallic taste in her mouth, denies postnasal drainage.  Records Reviewed:  PCP office note 05/29/23  On Monday or Tuesday, started to have R thoracic back that acutely started.  - Denies recent trauma. Fell 7 weeks ago, going to see ENT due to  - The pain is very severe, not getting better - Takes chronic oxy for chronic lower back, and r thoracic             - has been having to take higher doses.  - No weakness or R arm. No LE or UE weakness.       Past Medical History:  Diagnosis Date   Asthma    Depression    Diabetes mellitus without complication (HCC)    History of surgery on arm    right arm ( plate and screws)   Hypertension    Macular degeneration    Migraine    without aura   Pneumonia    Urinary incontinence     Past Surgical History:  Procedure Laterality Date   ABDOMINAL HYSTERECTOMY     ?Lt. ovary remains   APPENDECTOMY     Arm surgery     BREAST BIOPSY Right 10/10/2009   BREAST BIOPSY Right 09/17/2022   MM RT BREAST BX W LOC DEV 1ST LESION IMAGE BX SPEC STEREO GUIDE 09/17/2022 [facility]   BREAST CYST ASPIRATION  12/20/2013   BREAST EXCISIONAL BIOPSY Left 1998   BREAST EXCISIONAL BIOPSY Right 1993   ESOPHAGOGASTRODUODENOSCOPY N/A 04/17/2021   Procedure: ESOPHAGOGASTRODUODENOSCOPY (EGD);   Surgeon: Charna Elizabeth, MD;  Location: [facility] ENDOSCOPY;  Service: Endoscopy;  Laterality: N/A;  Rectal bleeding and anemia.   ESOPHAGOGASTRODUODENOSCOPY N/A 04/18/2021   Procedure: ESOPHAGOGASTRODUODENOSCOPY (EGD);  Surgeon: Jeani Hawking, MD;  Location: [facility] ENDOSCOPY;  Service: Endoscopy;  Laterality: N/A;   HOT HEMOSTASIS N/A 04/18/2021   Procedure: HOT HEMOSTASIS (ARGON PLASMA COAGULATION/BICAP);  Surgeon: Jeani Hawking, MD;  Location: [facility] ENDOSCOPY;  Service: Endoscopy;  Laterality: N/A;   IR ANGIOGRAM SELECTIVE EACH ADDITIONAL VESSEL  04/17/2021   IR ANGIOGRAM VISCERAL SELECTIVE  04/17/2021   IR EMBO ART  VEN HEMORR LYMPH EXTRAV  INC GUIDE ROADMAPPING  04/17/2021   IR US GUIDE VASC ACCESS RIGHT  04/17/2021   RADIOLOGY WITH ANESTHESIA N/A 04/17/2021   Procedure: RADIOLOGY WITH ANESTHESIA;  Surgeon: Radiologist, Medication, MD;  Location: [facility] OR;  Service: Radiology;  Laterality: N/A;   SUBMUCOSAL INJECTION  04/18/2021   Procedure: SUBMUCOSAL INJECTION;  Surgeon: Jeani Hawking, MD;  Location: [facility] ENDOSCOPY;  Service: Endoscopy;;    Family History  Problem Relation Age of Onset   Hypertension Mother    Hypertension Father    Hearing loss Sister    Heart attack Sister 40       s/p 4 bypasses  Stroke Brother        54   Breast cancer Maternal Aunt    Ovarian cancer Maternal Aunt     Social History:  reports that she quit smoking about 32 years ago. Her smoking use included cigarettes. She started smoking about 65 years ago. She has a 67.3 pack-year smoking history. She has never used smokeless tobacco. She reports that she does not drink alcohol and does not use drugs.  Allergies:  Allergies  Allergen Reactions   Amoxicillin Rash   Azithromycin Rash    Itching/rash   Butalbital Other (See Comments)    Reaction: trouble breathing, but uncertain   Clindamycin Hcl Rash   Januvia [Sitagliptin] Other (See Comments)    Worsened migraines.   Levaquin [Levofloxacin In D5w] Diarrhea   Meperidine Hcl Other  (See Comments)    REACTION: itching and hallucinations   Sulfamethoxazole Rash   Fexofenadine Hcl Other (See Comments)    Lips pealing    Ketorolac Swelling    Received ketorolac, methocarbamal, and lidocaine patch all at the same time.  Lip swelling 12 hours later   Macrodantin [Nitrofurantoin Macrocrystal] Itching   Methocarbamol Swelling    Received ketorolac, methocarbamal, and lidocaine patch all at the same time.  Lip swelling 12 hours later   Pioglitazone Other (See Comments)    Severe back pain and diffuse itching   Tramadol    Codeine Phosphate Other (See Comments)    REACTION: unknown   Etodolac Other (See Comments)    REACTION: stomach upset   Gabapentin Other (See Comments)    REACTION: mental clouding  Does not want to try again even in low dose.   Naproxen Other (See Comments)    REACTION: stomach upset   Pentazocine-Naloxone Hcl Other (See Comments)    REACTION: unspecified   Sumatriptan Other (See Comments)    REACTION: chest pain   Trimethoprim Palpitations    SOB, tongue swelling   Zonegran Other (See Comments)    Reaction: Unknown    Medications: I have reviewed the patient's current medications.  The PMH, PSH, Medications, Allergies, and SH were reviewed and updated.  ROS: Constitutional: Negative for fever, weight loss and weight gain. Cardiovascular: Negative for chest pain and dyspnea on exertion. Respiratory: Is not experiencing shortness of breath at rest. Gastrointestinal: Negative for nausea and vomiting. Neurological: Negative for headaches. Psychiatric: The patient is not nervous/anxious  Blood pressure (!) 144/80, pulse 84, SpO2 93%.  PHYSICAL EXAM:  Exam: General: Well-developed, well-nourished Communication and Voice: Clear pitch and clarity Respiratory Respiratory effort: Equal inspiration and expiration without stridor Cardiovascular Peripheral Vascular: Warm extremities with equal color/perfusion Eyes: No nystagmus with equal  extraocular motion bilaterally Neuro/Psych/Balance: Patient oriented to person, place, and time; Appropriate mood and affect; Gait is intact with no imbalance; Cranial nerves I-XII are intact Head and Face Inspection: Normocephalic and atraumatic without mass or lesion Palpation: Facial skeleton intact without bony stepoffs Salivary Glands: No mass or tenderness Facial Strength: Facial motility symmetric and full bilaterally ENT Pinna: External ear intact and fully developed External canal: Canal is patent with intact skin Tympanic Membrane: Clear and mobile External Nose: No scar or anatomic deformity Internal Nose: Septum is without septal hematoma. No polyp, or purulence. Mucosal edema and erythema present.  Bilateral inferior turbinate hypertrophy.  Patient was asked to lean forward with a chin down no rhinorrhea noted during the maneuver Lips, Teeth, and gums: Mucosa and teeth intact and viable TMJ: No pain to palpation with full mobility  Oral cavity/oropharynx: No erythema or exudate, no lesions present Nasopharynx: No mass or lesion with intact mucosa Neck Neck and Trachea: Midline trachea without mass or lesion Thyroid: No mass or nodularity Lymphatics: No lymphadenopathy  Procedure:  PROCEDURE NOTE: nasal endoscopy  Preoperative diagnosis: chronic nasal congestion and rhinorrhea, history of facial trauma   Postoperative diagnosis: same  Procedure: Diagnostic nasal endoscopy (75643)  Surgeon: Ashok Croon, M.D.  Anesthesia: Topical lidocaine and Afrin  H&P REVIEW: The patient's history and physical were reviewed today prior to procedure. All medications were reviewed and updated as well. Complications: None Condition is stable throughout exam Indications and consent: The patient presents with symptoms of chronic sinusitis not responding to previous therapies. All the risks, benefits, and potential complications were reviewed with the patient preoperatively and  informed consent was obtained. The time out was completed with confirmation of the correct procedure.   Procedure: The patient was seated upright in the clinic. Topical lidocaine and Afrin were applied to the nasal cavity. After adequate anesthesia had occurred, the rigid nasal endoscope was passed into the nasal cavity. The nasal mucosa, turbinates, septum, and sinus drainage pathways were visualized bilaterally. This revealed no purulence or significant secretions that might be cultured. There were no polyps or sites of significant inflammation. The mucosa was intact and there was no crusting present. The scope was then slowly withdrawn and the patient tolerated the procedure well. There were no complications or blood loss.   Studies Reviewed:CT head 04/08/23 TECHNIQUE: Contiguous axial images were obtained from the base of the skull through the vertex without intravenous contrast.   RADIATION DOSE REDUCTION: This exam was performed according to the departmental dose-optimization program which includes automated exposure control, adjustment of the mA and/or kV according to patient size and/or use of iterative reconstruction technique.   COMPARISON:  Head CT 09/06/2003 and MRI 05/25/2012   FINDINGS: Brain: There is no evidence of an acute infarct, intracranial hemorrhage, mass, midline shift, or extra-axial fluid collection. Mild cerebral atrophy is within normal limits for age. Cerebral white matter hypodensities are nonspecific but compatible with mild chronic small vessel ischemic disease.   Vascular: Calcified atherosclerosis at the skull base. No hyperdense vessel.   Skull: No acute calvarial fracture. Incomplete imaging of a mildly displaced right nasal bone fracture with margins appearing at least partly corticated and without visible overlying soft tissue swelling.   Sinuses/Orbits: Visualized paranasal sinuses and mastoid air cells are clear. Bilateral cataract extraction.    Other: None.   IMPRESSION: 1. No evidence of acute intracranial abnormality. 2. Mild chronic small vessel ischemic disease. 3. Incompletely imaged right nasal bone fracture, age indeterminate but potentially chronic. Correlate with physical examination.  Assessment/Plan: Encounter Diagnoses  Name Primary?   Rhinorrhea    Facial injury, initial encounter    Closed fracture of nasal bone, initial encounter Yes   Chronic nasal congestion    Vasomotor rhinitis     Right age-indeterminate nasal bone fracture -I personally reviewed CT head done in ED and there is no evidence of displacement.  No step-offs or obvious nasal deformity on exam -No interventions  2.  Nasal congestion and rhinorrhea -Likely related to allergic rhinitis or vasomotor rhinitis -I do not have any reason to believe that patient has CSF leak based on clinical history and my imaging review.  CT head with intact skull base and no skull base defects.  Patient denies continuous drainage from her nose or postnasal drainage, denies salty or metallic taste in her mouth,  denies headache -Will schedule CT BrainLab sinusesfor better evaluation and to rule out trauma along the skull base/rule out chronic sinus inflammation -Trial of Nasonex 2 puffs BID b/l nares -Continue Zyrtec 10 mg daily -Return after imaging    Thank you for allowing me to participate in the care of this patient. Please do not hesitate to contact me with any questions or concerns.   Ashok Croon, MD Otolaryngology [facility] Phone: [phone] Fax: [phone]    05/31/2023, 2:29 PM

## 2023-05-29 NOTE — Patient Instructions (Signed)
Good to see you today - Thank you for coming in  Things we discussed today:  1) For your back pain, it seems like an acute exacerbation of your chronic back pain. - Start taking Tizanidine half a tablet every 12 hours, as needed for back pain - Continue taking your normal dose of oxycodone (half tablet) - Continue applying voltaren gel to the area - Follow-up with your Orthopedic doctor  - Tizanidine can make you drowsy and increase your risk of falls. If you feel dizzy or overly sedated, stop taking it and follow-up with our clinic.  - If the pain does not improve in 2 weeks, we may get an xray of your back  2) If you ever need a steroid injection in the future, we may put you on a short course of insulin to prevent high sugars in the future.

## 2023-05-29 NOTE — Patient Instructions (Signed)
-   schedule CT sinuses - start Nasonex nasal spray and continue Zyrtec  - return after imaging

## 2023-06-01 ENCOUNTER — Other Ambulatory Visit: Payer: Self-pay

## 2023-06-01 ENCOUNTER — Ambulatory Visit: Payer: PPO | Admitting: Orthopaedic Surgery

## 2023-06-01 ENCOUNTER — Telehealth: Payer: Self-pay

## 2023-06-01 ENCOUNTER — Ambulatory Visit: Payer: PPO | Admitting: Physician Assistant

## 2023-06-01 DIAGNOSIS — M159 Polyosteoarthritis, unspecified: Secondary | ICD-10-CM

## 2023-06-01 DIAGNOSIS — G8929 Other chronic pain: Secondary | ICD-10-CM

## 2023-06-01 NOTE — Assessment & Plan Note (Signed)
Acute exacerbation of chronic R thoracic back pain. No recent trauma or trigger. Pt requiring increased doses of chronic percocet. Discussed getting imaging now vs f/u w/ her orthopedic surgeon, pt prefers to f/u with surgeon first. Will give short course tizanidine. - Tizanidine 2mg  BID, provided 15 day supply. This is short course.

## 2023-06-01 NOTE — Telephone Encounter (Signed)
Patient calls nurse line regarding follow up from Friday's visit.   She reports that she has been taking Tizanidine 0.5 tablets BID and 0.5 tablet oxycodone-acetaminophen for pain.   She reports that pain has not improved. She is still rating pain at 10/10.   She is requesting that message be sent to Dr. Linwood Dibbles for next steps.   Veronda Prude, RN

## 2023-06-01 NOTE — Assessment & Plan Note (Signed)
Normally well-controlled, but has hyperglycemia after steroid injections for her back pain.  Discussed with patient that she may need short course of low dose insulin when she gets her steroid injections in the future.  Advised patient to reach out if she knows she is going to get a steroid injection.

## 2023-06-02 ENCOUNTER — Telehealth: Payer: Self-pay

## 2023-06-02 MED ORDER — FAMOTIDINE 40 MG PO TABS: 40.0000 mg | ORAL_TABLET | Freq: Every day | ORAL | 3 refills | Status: DC

## 2023-06-02 MED ORDER — OXYCODONE-ACETAMINOPHEN 5-325 MG PO TABS: 0.5000 | ORAL_TABLET | Freq: Four times a day (QID) | ORAL | 0 refills | Status: DC | PRN

## 2023-06-02 NOTE — Telephone Encounter (Signed)
Patient calls nurse line in regards to Ozempic shipment.   She reports she is down to her last 3 pens, which is a 3 week supply. She is fearful on running out.   Advised will check with [facility] on shipment updates. Advised patient on recent ship delays.  Will forward to pharmacy.

## 2023-06-02 NOTE — Telephone Encounter (Signed)
Called patient and she has made an appointment with her orthopedic on 06/24/2023.  She also has an appointment her with [facility] on 06/11/2023.  Patient wants to know if she can increase her oxycodone for her back pain for now?  Glennie Hawk, CMA

## 2023-06-02 NOTE — Telephone Encounter (Signed)
Refilled patient's oxycodone. I would recommend she be evaluated if she is still having 10/10 pain. If she is truly having that much pain despite attempts at pain control, she needs to be seen and likely should not wait a week or two to be seen. If that severe and we don't have availability until then, she needs evaluation at urgent care or ED.

## 2023-06-05 ENCOUNTER — Institutional Professional Consult (permissible substitution) (INDEPENDENT_AMBULATORY_CARE_PROVIDER_SITE_OTHER): Payer: PPO | Admitting: Otolaryngology

## 2023-06-09 NOTE — Telephone Encounter (Signed)
Faxed updated rx to [facility]   Ozempic 1mg  dose pens - pcp: [facility]

## 2023-06-10 ENCOUNTER — Ambulatory Visit
Admission: EM | Admit: 2023-06-10 | Discharge: 2023-06-10 | Disposition: A | Discharge status: Home / Self Care | Payer: PPO | Attending: Internal Medicine | Admitting: Internal Medicine

## 2023-06-10 DIAGNOSIS — G894 Chronic pain syndrome: Secondary | ICD-10-CM | POA: Insufficient documentation

## 2023-06-10 DIAGNOSIS — N10 Acute tubulo-interstitial nephritis: Secondary | ICD-10-CM | POA: Insufficient documentation

## 2023-06-10 LAB — POCT URINALYSIS DIP (MANUAL ENTRY)
Bilirubin, UA: NEGATIVE
Glucose, UA: NEGATIVE mg/dL
Ketones, POC UA: NEGATIVE mg/dL
Nitrite, UA: POSITIVE — AB
Protein Ur, POC: 100 mg/dL — AB
Spec Grav, UA: 1.02 (ref 1.010–1.025)
Urobilinogen, UA: 0.2 U/dL
pH, UA: 7 (ref 5.0–8.0)

## 2023-06-10 MED ORDER — PREGABALIN 50 MG PO CAPS: 50.0000 mg | ORAL_CAPSULE | Freq: Two times a day (BID) | ORAL | 0 refills | Status: DC | PRN

## 2023-06-10 MED ORDER — CIPROFLOXACIN HCL 500 MG PO TABS: 500.0000 mg | ORAL_TABLET | Freq: Two times a day (BID) | ORAL | 0 refills | Status: DC

## 2023-06-10 MED ORDER — CEFTRIAXONE SODIUM 1 G IJ SOLR
1.0000 g | Freq: Once | INTRAMUSCULAR | Status: AC
  Administered 2023-06-10: 1 g via INTRAMUSCULAR

## 2023-06-10 NOTE — ED Provider Notes (Signed)
[facility]  Note:  This document was prepared using Conservation officer, historic buildings and may include unintentional dictation errors.  MRN: [id number] DOB: 1941/02/17  Subjective:   [name] is a 82 y.o. female presenting for 2-week history of persistent and worsening right-sided flank pain, mid thoracic back pain.  Reports that she has chronic back pain but this is worse.  She has been taking her chronic pain medicine but is not helping.  She was prescribed tizanidine but stopped this due to side effects.  Of note, she did have an urinary tract infection from 05/17/2023.  She underwent a course of cefdinir.  Has previously had ciprofloxacin and ceftriaxone.  No current facility-administered medications for this encounter.  Current Outpatient Medications:    acetic acid-hydrocortisone (VOSOL-HC) OTIC solution, Place 3 drops into both ears 2 (two) times daily., Disp: 10 mL, Rfl: 3   Artificial Tear Ointment (DRY EYES OP), Apply 1 drop to eye 2 (two) times daily as needed (dry eyes)., Disp: , Rfl:    baclofen (LIORESAL) 20 MG tablet, Take 1 tablet (20 mg total) by mouth 3 (three) times daily., Disp: 270 tablet, Rfl: 0   benzonatate (TESSALON PERLES) 100 MG capsule, Take 2 capsules (200 mg total) by mouth 3 (three) times daily as needed for cough., Disp: 20 capsule, Rfl: 0   busPIRone (BUSPAR) 5 MG tablet, Take 1 tablet (5 mg total) by mouth 3 (three) times daily., Disp: 270 tablet, Rfl: 3   cetirizine (ZYRTEC) 5 MG tablet, Take 1 tablet (5 mg total) by mouth daily as needed for allergies., Disp: 30 tablet, Rfl: 8   Clobetasol Prop Emollient Base (CLOBETASOL PROPIONATE E) 0.05 % emollient cream, Apply 2 Applications topically at bedtime., Disp: 60 g, Rfl: 2   D-Mannose 500 MG CAPS, Take 1 capsule by mouth daily., Disp: 30 capsule, Rfl: 5   DULoxetine (CYMBALTA) 30 MG capsule, TAKE ONE CAPSULE BY MOUTH DAILY, Disp: 90 capsule, Rfl: 3   enalapril (VASOTEC) 10 MG  tablet, Take 1 tablet (10 mg total) by mouth daily., Disp: 90 tablet, Rfl: 0   estradiol (ESTRACE) 0.1 MG/GM vaginal cream, Place 0.5g nightly for two weeks then twice a week after, Disp: 30 g, Rfl: 11   famotidine (PEPCID) 40 MG tablet, Take 1 tablet (40 mg total) by mouth daily., Disp: 90 tablet, Rfl: 3   fexofenadine (ALLEGRA) 180 MG tablet, Take 1 tablet (180 mg total) by mouth daily., Disp: 30 tablet, Rfl: 6   glucose blood (ONETOUCH VERIO) test strip, USE  STRIP TO CHECK GLUCOSE TWICE DAILY AS DIRECTED. E11.9, Disp: 100 each, Rfl: 3   Lancets (ONETOUCH DELICA PLUS LANCET33G) MISC, USE TO CHECK GLUCOSE IN THE MORNING AND AT BEDTIME. E11.9, Disp: 100 each, Rfl: 3   metoprolol succinate (TOPROL-XL) 25 MG 24 hr tablet, TAKE ONE TABLET BY MOUTH DAILY, Disp: 90 tablet, Rfl: 3   mometasone (NASONEX) 50 MCG/ACT nasal spray, Place 2 sprays into the nose 2 (two) times daily., Disp: 1 each, Rfl: 12   Multiple Vitamin (MULTIVITAMIN WITH MINERALS) TABS tablet, Take 1 tablet by mouth daily., Disp: , Rfl:    ondansetron (ZOFRAN-ODT) 4 MG disintegrating tablet, Take 1 tablet (4 mg total) by mouth every 8 (eight) hours as needed for nausea or vomiting., Disp: 12 tablet, Rfl: 0   oxyCODONE-acetaminophen (PERCOCET/ROXICET) 5-325 MG tablet, Take 0.5 tablets by mouth every 6 (six) hours as needed for severe pain (pain score 7-10)., Disp: 60 tablet, Rfl: 0  pantoprazole (PROTONIX) 40 MG tablet, TAKE ONE TABLET BY MOUTH DAILY, Disp: 90 tablet, Rfl: 3   rosuvastatin (CRESTOR) 20 MG tablet, Take 1 tablet (20 mg total) by mouth daily., Disp: 90 tablet, Rfl: 3   Semaglutide, 1 MG/DOSE, (OZEMPIC, 1 MG/DOSE,) 2 MG/1.5ML SOPN, Inject 2 mg into the skin once a week. (Patient taking differently: Inject 1 mg into the skin every Sunday.), Disp: , Rfl:    tiZANidine (ZANAFLEX) 4 MG tablet, Take 0.5 tablets (2 mg total) by mouth 2 (two) times daily as needed for muscle spasms (back pain)., Disp: 15 tablet, Rfl: 0   triamcinolone  ointment (KENALOG) 0.5 %, Apply 1 application topically 2 (two) times daily. For elbows (Patient taking differently: Apply 1 application  topically 2 (two) times daily as needed (elbow itching).), Disp: 30 g, Rfl: 2   trospium (SANCTURA) 20 MG tablet, TAKE 1 TABLET BY MOUTH 2 TIMES A DAY, Disp: 60 tablet, Rfl: 1   Allergies  Allergen Reactions   Amoxicillin Rash   Azithromycin Rash    Itching/rash   Butalbital Other (See Comments)    Reaction: trouble breathing, but uncertain   Clindamycin Hcl Rash   Januvia [Sitagliptin] Other (See Comments)    Worsened migraines.   Levaquin [Levofloxacin In D5w] Diarrhea   Meperidine Hcl Other (See Comments)    REACTION: itching and hallucinations   Sulfamethoxazole Rash   Fexofenadine Hcl Other (See Comments)    Lips pealing    Ketorolac Swelling    Received ketorolac, methocarbamal, and lidocaine patch all at the same time.  Lip swelling 12 hours later   Macrodantin [Nitrofurantoin Macrocrystal] Itching   Methocarbamol Swelling    Received ketorolac, methocarbamal, and lidocaine patch all at the same time.  Lip swelling 12 hours later   Nitrofurantoin Itching   Pioglitazone Other (See Comments)    Severe back pain and diffuse itching   Tramadol    Codeine Phosphate Other (See Comments)    REACTION: unknown   Etodolac Other (See Comments)    REACTION: stomach upset   Gabapentin Other (See Comments)    REACTION: mental clouding  Does not want to try again even in low dose.   Naproxen Other (See Comments)    REACTION: stomach upset   Pentazocine-Naloxone Hcl Other (See Comments)    REACTION: unspecified   Sumatriptan Other (See Comments)    REACTION: chest pain   Trimethoprim Palpitations    SOB, tongue swelling   Zonegran Other (See Comments)    Reaction: Unknown    Past Medical History:  Diagnosis Date   Asthma    Depression    Diabetes mellitus without complication (HCC)    History of surgery on arm    right arm ( plate and  screws)   Hypertension    Macular degeneration    Migraine    without aura   Pneumonia    Urinary incontinence      Past Surgical History:  Procedure Laterality Date   ABDOMINAL HYSTERECTOMY     ?Lt. ovary remains   APPENDECTOMY     Arm surgery     BREAST BIOPSY Right 10/10/2009   BREAST BIOPSY Right 09/17/2022   MM RT BREAST BX W LOC DEV 1ST LESION IMAGE BX SPEC STEREO GUIDE 09/17/2022 [facility]   BREAST CYST ASPIRATION  12/20/2013   BREAST EXCISIONAL BIOPSY Left 1998   BREAST EXCISIONAL BIOPSY Right 1993   ESOPHAGOGASTRODUODENOSCOPY N/A 04/17/2021   Procedure: ESOPHAGOGASTRODUODENOSCOPY (EGD);  Surgeon: Charna Elizabeth, MD;  Location: [facility] ENDOSCOPY;  Service: Endoscopy;  Laterality: N/A;  Rectal bleeding and anemia.   ESOPHAGOGASTRODUODENOSCOPY N/A 04/18/2021   Procedure: ESOPHAGOGASTRODUODENOSCOPY (EGD);  Surgeon: Jeani Hawking, MD;  Location: [facility] ENDOSCOPY;  Service: Endoscopy;  Laterality: N/A;   HOT HEMOSTASIS N/A 04/18/2021   Procedure: HOT HEMOSTASIS (ARGON PLASMA COAGULATION/BICAP);  Surgeon: Jeani Hawking, MD;  Location: [facility] ENDOSCOPY;  Service: Endoscopy;  Laterality: N/A;   IR ANGIOGRAM SELECTIVE EACH ADDITIONAL VESSEL  04/17/2021   IR ANGIOGRAM VISCERAL SELECTIVE  04/17/2021   IR EMBO ART  VEN HEMORR LYMPH EXTRAV  INC GUIDE ROADMAPPING  04/17/2021   IR US GUIDE VASC ACCESS RIGHT  04/17/2021   RADIOLOGY WITH ANESTHESIA N/A 04/17/2021   Procedure: RADIOLOGY WITH ANESTHESIA;  Surgeon: Radiologist, Medication, MD;  Location: [facility] OR;  Service: Radiology;  Laterality: N/A;   SUBMUCOSAL INJECTION  04/18/2021   Procedure: SUBMUCOSAL INJECTION;  Surgeon: Jeani Hawking, MD;  Location: [facility] ENDOSCOPY;  Service: Endoscopy;;    Family History  Problem Relation Age of Onset   Hypertension Mother    Hypertension Father    Hearing loss Sister    Heart attack Sister 62       s/p 4 bypasses    Stroke Brother        58   Breast cancer Maternal Aunt    Ovarian cancer Maternal Aunt     Social  History   Tobacco Use   Smoking status: Former    Current packs/day: 0.00    Average packs/day: 2.0 packs/day for 33.6 years (67.3 ttl pk-yrs)    Types: Cigarettes    Start date: 08/11/1957    Quit date: 04/04/1991    Years since quitting: 32.2   Smokeless tobacco: Never  Vaping Use   Vaping status: Never Used  Substance Use Topics   Alcohol use: No   Drug use: No    ROS   Objective:   Vitals: BP (!) 154/81 (BP Location: Right Arm)   Pulse 100   Temp 100 F (37.8 C) (Oral)   Resp 18   SpO2 92%   Physical Exam Constitutional:      General: She is not in acute distress.    Appearance: Normal appearance. She is well-developed. She is not ill-appearing, toxic-appearing or diaphoretic.  HENT:     Head: Normocephalic and atraumatic.     Nose: Nose normal.     Mouth/Throat:     Mouth: Mucous membranes are moist.     Pharynx: Oropharynx is clear.  Eyes:     General: No scleral icterus.       Right eye: No discharge.        Left eye: No discharge.     Extraocular Movements: Extraocular movements intact.     Conjunctiva/sclera: Conjunctivae normal.  Cardiovascular:     Rate and Rhythm: Normal rate.  Pulmonary:     Effort: Pulmonary effort is normal.  Abdominal:     General: Bowel sounds are normal. There is no distension.     Palpations: Abdomen is soft. There is no mass.     Tenderness: There is no abdominal tenderness. There is right CVA tenderness. There is no left CVA tenderness, guarding or rebound.  Skin:    General: Skin is warm and dry.  Neurological:     General: No focal deficit present.     Mental Status: She is alert and oriented to person, place, and time.  Psychiatric:        Mood and Affect: Mood normal.  Behavior: Behavior normal.        Thought Content: Thought content normal.        Judgment: Judgment normal.     Results for orders placed or performed during the hospital encounter of 06/10/23 (from the past 24 hour(s))  POCT urinalysis  dipstick     Status: Abnormal   Collection Time: 06/10/23  3:36 PM  Result Value Ref Range   Color, UA yellow yellow   Clarity, UA cloudy (A) clear   Glucose, UA negative negative mg/dL   Bilirubin, UA negative negative   Ketones, POC UA negative negative mg/dL   Spec Grav, UA 1.610 9.604 - 1.025   Blood, UA small (A) negative   pH, UA 7.0 5.0 - 8.0   Protein Ur, POC =100 (A) negative mg/dL   Urobilinogen, UA 0.2 0.2 or 1.0 E.U./dL   Nitrite, UA Positive (A) Negative   Leukocytes, UA Small (1+) (A) Negative   *Note: Due to a large number of results and/or encounters for the requested time period, some results have not been displayed. A complete set of results can be found in Results Review.   IM ceftriaxone 1000 mg administered in clinic.  Assessment and Plan :   PDMP not reviewed this encounter.  1. Acute pyelonephritis   2. Chronic pain syndrome    Creatinine clearance calculated at 60mL/min from her latest creatinine level 05/22/2023.  IM ceftriaxone as above, ciprofloxacin as an outpatient for management of acute pyelonephritis.  Emphasized hydrating consistently.  Can continue chronic back pain medications.  Offered Lyrica as a secondary option in the event she is having concurrent acute on chronic back pain.  Follow-up with PCP tomorrow, maintain strict ER precautions.  Counseled patient on potential for adverse effects with medications prescribed today, patient verbalized understanding.    Wallis Bamberg, [place] 06/10/23 [id number]

## 2023-06-10 NOTE — Telephone Encounter (Signed)
Patient returns call to nurse line regarding continued pain. She reports that oxycodone is not helping.   She has an appointment with Dr. Linwood Dibbles tomorrow morning.   She is asking if there is anything Dr. Linwood Dibbles can give her until then.   She is not taking muscle relaxer as she felt that this caused her to become very confused.  Patient is rating pain at 10/10, has to lay on the floor to get any relief.   Advised that as pain is quite severe, she be evaluated today. Patient verbalizes understanding.   She still plans to keep scheduled follow up appointment tomorrow morning.   Veronda Prude, RN

## 2023-06-10 NOTE — ED Triage Notes (Signed)
Pt reports chronic right middle back pain, states worse fro the past couple weeks. Pt taking oxycodone without relief. Pt reports she was prescribed Tizanidine on 05/29/23 and she stopped as she was shaking, brain fog, was not able to complete a sentence or write. Pt reports pain improves when she lay on the floor.

## 2023-06-10 NOTE — Progress Notes (Unsigned)
   SUBJECTIVE:   CHIEF COMPLAINT / HPI:   Diabetes, Type 2 - Last A1c 9.8 - Medications: ozempic 1mg  - Compliance: good - Checking BG at home: yes, 80-140s. Mostly 110s. - Eye exam: UTD - Foot exam: due - Microalbumin: UTD - Statin: yes - Denies symptoms of hypoglycemia, polyuria, polydipsia, numbness extremities, foot ulcers/trauma  Flank pain - seen at [facility] yesterday with flank pain, thought 2/2 pyelonephritis given concurrent UTI, gave IM CTX and sent home with ciprofloxacin. Hasn't yet been 24 hours. Still with flank pain, having to take 1 full pill of oxy.   Aortic aneurysm - infrarenal. Increase in size on MRI 01/2023 to 3.4cm with dissection flap, recommend f/u with CTA.   Pulm nodule - seen on prior chest CT 04/2021, 7mm in RML. Due for f/u CT.   OBJECTIVE:   BP 134/80   Pulse (!) 118   Temp 98.1 F (36.7 C)   Ht 4\' 11"  (1.499 m)   Wt 175 lb 12.8 oz (79.7 kg)   SpO2 94%   BMI 35.51 kg/m   Gen: well appearing, in NAD Card: RRR Lungs: CTAB Abd: +CVA tenderness Ext: WWP, no edema   ASSESSMENT/PLAN:   Diabetes mellitus type 2, controlled, with complications (HCC) Back to baseline CBGs. CBG today 134. Continue ozempic. F/u 3 months, repeat A1c at that time. Foot exam on followup. Declines PCV20 today.  AAA (abdominal aortic aneurysm) (HCC) Obtain CTA.   Pulmonary nodule Repeat low dose chest CT  Pyelonephritis Presumed given +CVA tenderness and concurrent UTI. Not yet 24 hours of antibiotics with persistent symptoms. No signs to concern for urosepsis. Recommend continuing current abx, will watch out for Ucx results. Expect flank pain to resolve with infection. Return to clinic for reeval if no better by Monday. Emergency precautions discussed.     Caro Laroche, DO

## 2023-06-10 NOTE — Discharge Instructions (Addendum)
You have received an injection of an antibiotic for a kidney infection. Start ciprofloxacin twice daily to continue helping with this.  Make sure you are getting plenty of fluids, drink at least 64 ounces of water.  You can continue using your chronic pain medications.  I am prescribing Lyrica as well.  If you continue to worsen then please go to the emergency room.  Otherwise, we will follow-up with you in about 3 to 4 days with your urine culture results.

## 2023-06-11 ENCOUNTER — Ambulatory Visit (INDEPENDENT_AMBULATORY_CARE_PROVIDER_SITE_OTHER): Payer: PPO | Admitting: Family Medicine

## 2023-06-11 ENCOUNTER — Encounter: Payer: Self-pay | Admitting: Family Medicine

## 2023-06-11 VITALS — BP 134/80 | HR 118 | Temp 98.1°F | Ht 59.0 in | Wt 175.8 lb

## 2023-06-11 DIAGNOSIS — Z7985 Long-term (current) use of injectable non-insulin antidiabetic drugs: Secondary | ICD-10-CM

## 2023-06-11 DIAGNOSIS — I7143 Infrarenal abdominal aortic aneurysm, without rupture: Secondary | ICD-10-CM

## 2023-06-11 DIAGNOSIS — E118 Type 2 diabetes mellitus with unspecified complications: Secondary | ICD-10-CM

## 2023-06-11 DIAGNOSIS — N12 Tubulo-interstitial nephritis, not specified as acute or chronic: Secondary | ICD-10-CM | POA: Diagnosis not present

## 2023-06-11 DIAGNOSIS — R911 Solitary pulmonary nodule: Secondary | ICD-10-CM | POA: Diagnosis not present

## 2023-06-11 LAB — GLUCOSE, POCT (MANUAL RESULT ENTRY): POC Glucose: 134 mg/dL — AB (ref 70–99)

## 2023-06-11 NOTE — Assessment & Plan Note (Signed)
Repeat low dose chest CT

## 2023-06-11 NOTE — Patient Instructions (Addendum)
It was great to see you!  Our plans for today:  - We are getting imaging of your lung and aorta to follow up your lung nodule and aortic aneurysm.  - No changes to your diabetes medications.  - If you are still having significant back pain by Monday, let us know. If your symptoms worsen significantly over the weekend, go to the Emergency Room.  - Come back in 3 months.  We are checking some labs today, we will release these results to your MyChart.  Take care and seek immediate care sooner if you develop any concerns.   Dr. Linwood Dibbles

## 2023-06-11 NOTE — Assessment & Plan Note (Addendum)
Back to baseline CBGs. CBG today 134. Continue ozempic. F/u 3 months, repeat A1c at that time. Foot exam on followup. Declines PCV20 today.

## 2023-06-11 NOTE — Assessment & Plan Note (Signed)
Obtain CTA.

## 2023-06-11 NOTE — Assessment & Plan Note (Addendum)
Presumed given +CVA tenderness and concurrent UTI. Not yet 24 hours of antibiotics with persistent symptoms. No signs to concern for urosepsis. Recommend continuing current abx, will watch out for Ucx results. Expect flank pain to resolve with infection. Return to clinic for reeval if no better by Monday. Emergency precautions discussed.

## 2023-06-12 ENCOUNTER — Telehealth: Payer: Self-pay

## 2023-06-12 LAB — BASIC METABOLIC PANEL
BUN/Creatinine Ratio: 16 (ref 12–28)
BUN: 15 mg/dL (ref 8–27)
CO2: 21 mmol/L (ref 20–29)
Calcium: 9.8 mg/dL (ref 8.7–10.3)
Chloride: 101 mmol/L (ref 96–106)
Creatinine, Ser: 0.96 mg/dL (ref 0.57–1.00)
Glucose: 168 mg/dL — ABNORMAL HIGH (ref 70–99)
Potassium: 4.4 mmol/L (ref 3.5–5.2)
Sodium: 140 mmol/L (ref 134–144)
eGFR: 59 mL/min/{1.73_m2} — ABNORMAL LOW (ref >59)

## 2023-06-12 NOTE — Telephone Encounter (Signed)
Patient [name] on nurse line yesterday evening requesting lab results.   Will forward to PCP.

## 2023-06-13 LAB — URINE CULTURE: Culture: >=100000 — AB

## 2023-06-18 ENCOUNTER — Ambulatory Visit: Payer: PPO | Admitting: Obstetrics and Gynecology

## 2023-06-19 ENCOUNTER — Other Ambulatory Visit: Payer: Self-pay

## 2023-06-19 ENCOUNTER — Emergency Department (HOSPITAL_COMMUNITY)
Admission: EM | Admit: 2023-06-19 | Discharge: 2023-06-20 | Disposition: A | Discharge status: Home / Self Care | Payer: PPO | Attending: Emergency Medicine | Admitting: Emergency Medicine

## 2023-06-19 ENCOUNTER — Emergency Department (HOSPITAL_COMMUNITY): Payer: PPO

## 2023-06-19 ENCOUNTER — Encounter (HOSPITAL_COMMUNITY): Payer: Self-pay | Admitting: Emergency Medicine

## 2023-06-19 DIAGNOSIS — R6883 Chills (without fever): Secondary | ICD-10-CM | POA: Diagnosis not present

## 2023-06-19 DIAGNOSIS — G8929 Other chronic pain: Secondary | ICD-10-CM

## 2023-06-19 DIAGNOSIS — Z79899 Other long term (current) drug therapy: Secondary | ICD-10-CM | POA: Insufficient documentation

## 2023-06-19 DIAGNOSIS — E119 Type 2 diabetes mellitus without complications: Secondary | ICD-10-CM | POA: Insufficient documentation

## 2023-06-19 DIAGNOSIS — K429 Umbilical hernia without obstruction or gangrene: Secondary | ICD-10-CM | POA: Diagnosis not present

## 2023-06-19 DIAGNOSIS — R109 Unspecified abdominal pain: Secondary | ICD-10-CM | POA: Diagnosis not present

## 2023-06-19 DIAGNOSIS — R110 Nausea: Secondary | ICD-10-CM | POA: Diagnosis not present

## 2023-06-19 DIAGNOSIS — R61 Generalized hyperhidrosis: Secondary | ICD-10-CM | POA: Diagnosis not present

## 2023-06-19 DIAGNOSIS — N3000 Acute cystitis without hematuria: Secondary | ICD-10-CM

## 2023-06-19 DIAGNOSIS — I10 Essential (primary) hypertension: Secondary | ICD-10-CM | POA: Insufficient documentation

## 2023-06-19 DIAGNOSIS — M159 Polyosteoarthritis, unspecified: Secondary | ICD-10-CM

## 2023-06-19 DIAGNOSIS — I714 Abdominal aortic aneurysm, without rupture, unspecified: Secondary | ICD-10-CM

## 2023-06-19 DIAGNOSIS — I700 Atherosclerosis of aorta: Secondary | ICD-10-CM | POA: Diagnosis not present

## 2023-06-19 DIAGNOSIS — Z7984 Long term (current) use of oral hypoglycemic drugs: Secondary | ICD-10-CM | POA: Diagnosis not present

## 2023-06-19 DIAGNOSIS — R932 Abnormal findings on diagnostic imaging of liver and biliary tract: Secondary | ICD-10-CM | POA: Diagnosis not present

## 2023-06-19 LAB — COMPREHENSIVE METABOLIC PANEL
ALT: 37 U/L (ref 0–44)
AST: 72 U/L — ABNORMAL HIGH (ref 15–41)
Albumin: 4.5 g/dL (ref 3.5–5.0)
Alkaline Phosphatase: 66 U/L (ref 38–126)
Anion gap: 8 (ref 5–15)
BUN: 12 mg/dL (ref 8–23)
CO2: 24 mmol/L (ref 22–32)
Calcium: 9.6 mg/dL (ref 8.9–10.3)
Chloride: 107 mmol/L (ref 98–111)
Creatinine, Ser: 0.84 mg/dL (ref 0.44–1.00)
GFR, Estimated: >60 mL/min (ref >60)
Glucose, Bld: 134 mg/dL — ABNORMAL HIGH (ref 70–99)
Potassium: 3.4 mmol/L — ABNORMAL LOW (ref 3.5–5.1)
Sodium: 139 mmol/L (ref 135–145)
Total Bilirubin: 1.5 mg/dL — ABNORMAL HIGH (ref <1.2)
Total Protein: 7.5 g/dL (ref 6.5–8.1)

## 2023-06-19 LAB — CBC WITH DIFFERENTIAL/PLATELET
Abs Immature Granulocytes: 0.03 10*3/uL (ref 0.00–0.07)
Basophils Absolute: 0.1 10*3/uL (ref 0.0–0.1)
Basophils Relative: 1 %
Eosinophils Absolute: 0.1 10*3/uL (ref 0.0–0.5)
Eosinophils Relative: 2 %
HCT: 45.4 % (ref 36.0–46.0)
Hemoglobin: 14.5 g/dL (ref 12.0–15.0)
Immature Granulocytes: 0 %
Lymphocytes Relative: 20 %
Lymphs Abs: 1.9 10*3/uL (ref 0.7–4.0)
MCH: 30.4 pg (ref 26.0–34.0)
MCHC: 31.9 g/dL (ref 30.0–36.0)
MCV: 95.2 fL (ref 80.0–100.0)
Monocytes Absolute: 1 10*3/uL (ref 0.1–1.0)
Monocytes Relative: 11 %
Neutro Abs: 6.3 10*3/uL (ref 1.7–7.7)
Neutrophils Relative %: 66 %
Platelets: 205 10*3/uL (ref 150–400)
RBC: 4.77 MIL/uL (ref 3.87–5.11)
RDW: 15.8 % — ABNORMAL HIGH (ref 11.5–15.5)
WBC: 9.4 10*3/uL (ref 4.0–10.5)
nRBC: 0 % (ref 0.0–0.2)

## 2023-06-19 LAB — PROTIME-INR
INR: 0.9 (ref 0.8–1.2)
Prothrombin Time: 12.6 s (ref 11.4–15.2)

## 2023-06-19 LAB — I-STAT CG4 LACTIC ACID, ED: Lactic Acid, Venous: 1.2 mmol/L (ref 0.5–1.9)

## 2023-06-19 MED ORDER — MORPHINE SULFATE (PF) 4 MG/ML IV SOLN
4.0000 mg | Freq: Once | INTRAVENOUS | Status: AC
  Administered 2023-06-19: 4 mg via INTRAVENOUS
  Filled 2023-06-19: qty 1

## 2023-06-19 MED ORDER — ONDANSETRON HCL 4 MG/2ML IJ SOLN
4.0000 mg | Freq: Once | INTRAMUSCULAR | Status: AC
  Administered 2023-06-19: 4 mg via INTRAVENOUS
  Filled 2023-06-19: qty 2

## 2023-06-19 NOTE — ED Provider Notes (Signed)
[place] EMERGENCY DEPARTMENT AT [facility] Provider Note   CSN: [id number] Arrival date & time: 06/19/23  1837     History  Chief Complaint  Patient presents with   Flank Pain    [name] is a 82 y.o. female.   Flank Pain   Patient has a history of hypertension migraines diabetes depression pneumonia.  Patient states she has been having flank pain for the last 10 days.  She has seen her doctor and was diagnosed with a urinary tract infection.  Patient has been taking antibiotics most recently ciprofloxacin.  Patient states she also received a Rocephin injection.  She has had some nausea chills as well as diaphoresis.  Patient states the pain in her right flank is intense.  She is unable to get a comfortable position    Home Medications Prior to Admission medications   Medication Sig Start Date End Date Taking? Authorizing Provider  Artificial Tear Ointment (DRY EYES OP) Apply 1 drop to eye 2 (two) times daily as needed (dry eyes).   Yes [provider]  baclofen (LIORESAL) 20 MG tablet Take 1 tablet (20 mg total) by mouth 3 (three) times daily. 03/30/23  Yes Caro Laroche, DO  busPIRone (BUSPAR) 5 MG tablet Take 1 tablet (5 mg total) by mouth 3 (three) times daily. Patient taking differently: Take 5 mg by mouth 2 (two) times daily. 09/24/22  Yes Hensel, Santiago Bumpers, MD  ciprofloxacin (CIPRO) 500 MG tablet Take 1 tablet (500 mg total) by mouth 2 (two) times daily. 06/10/23  Yes Wallis Bamberg, PA-C  DULoxetine (CYMBALTA) 30 MG capsule TAKE ONE CAPSULE BY MOUTH DAILY 09/25/22  Yes Hensel, Santiago Bumpers, MD  enalapril (VASOTEC) 10 MG tablet Take 1 tablet (10 mg total) by mouth daily. 12/12/22  Yes Latrelle Dodrill, MD  estradiol (ESTRACE) 0.1 MG/GM vaginal cream Place 0.5g nightly for two weeks then twice a week after 03/13/22  Yes Marguerita Beards, MD  famotidine (PEPCID) 40 MG tablet Take 1 tablet (40 mg total) by mouth daily. 06/02/23  Yes Caro Laroche, DO  fexofenadine (ALLEGRA) 180 MG tablet Take 1 tablet (180 mg total) by mouth daily. 08/18/22  Yes Hensel, Santiago Bumpers, MD  metoprolol succinate (TOPROL-XL) 25 MG 24 hr tablet TAKE ONE TABLET BY MOUTH DAILY 08/07/22  Yes Hensel, Santiago Bumpers, MD  mometasone (NASONEX) 50 MCG/ACT nasal spray Place 2 sprays into the nose 2 (two) times daily. Patient taking differently: Place 2 sprays into the nose 2 (two) times daily as needed. 05/29/23  Yes Ashok Croon, MD  Multiple Vitamin (MULTIVITAMIN WITH MINERALS) TABS tablet Take 1 tablet by mouth daily.   Yes [provider]  Multiple Vitamins-Minerals (ICAPS AREDS 2 PO) Take 1 tablet by mouth in the morning and at bedtime.   Yes [provider]  ondansetron (ZOFRAN-ODT) 4 MG disintegrating tablet Take 1 tablet (4 mg total) by mouth every 8 (eight) hours as needed for nausea or vomiting. 05/17/23  Yes Rondel Baton, MD  oxyCODONE-acetaminophen (PERCOCET/ROXICET) 5-325 MG tablet Take 0.5 tablets by mouth every 6 (six) hours as needed for severe pain (pain score 7-10). 06/02/23  Yes Caro Laroche, DO  pantoprazole (PROTONIX) 40 MG tablet TAKE ONE TABLET BY MOUTH DAILY 09/15/22  Yes Hensel, Santiago Bumpers, MD  rosuvastatin (CRESTOR) 20 MG tablet Take 1 tablet (20 mg total) by mouth daily. 03/27/23  Yes Rumball, Darl Householder, DO  Semaglutide, 1 MG/DOSE, (OZEMPIC, 1 MG/DOSE,) 2 MG/1.5ML SOPN  Inject 2 mg into the skin once a week. Patient taking differently: Inject 1 mg into the skin every Sunday. 01/14/21  Yes Hensel, Santiago Bumpers, MD  tiZANidine (ZANAFLEX) 4 MG tablet Take 0.5 tablets (2 mg total) by mouth 2 (two) times daily as needed for muscle spasms (back pain). 05/29/23  Yes Lincoln Brigham, MD  triamcinolone ointment (KENALOG) 0.5 % Apply 1 application topically 2 (two) times daily. For elbows Patient taking differently: Apply 1 application  topically 2 (two) times daily as needed (elbow itching). 10/10/19  Yes Hensel, Santiago Bumpers, MD  acetic  acid-hydrocortisone (VOSOL-HC) OTIC solution Place 3 drops into both ears 2 (two) times daily. Patient not taking: Reported on 06/19/2023 02/28/21   Moses Manners, MD  benzonatate (TESSALON PERLES) 100 MG capsule Take 2 capsules (200 mg total) by mouth 3 (three) times daily as needed for cough. Patient not taking: Reported on 06/19/2023 12/22/22   Sabino Dick, DO  cetirizine (ZYRTEC) 5 MG tablet Take 1 tablet (5 mg total) by mouth daily as needed for allergies. Patient not taking: Reported on 06/19/2023 04/08/23   Bess Kinds, MD  Clobetasol Prop Emollient Base (CLOBETASOL PROPIONATE E) 0.05 % emollient cream Apply 2 Applications topically at bedtime. Patient not taking: Reported on 06/19/2023 11/10/22   Selmer Dominion, NP  D-Mannose 500 MG CAPS Take 1 capsule by mouth daily. Patient not taking: Reported on 06/19/2023 02/23/23   Selmer Dominion, NP  glucose blood (ONETOUCH VERIO) test strip USE  STRIP TO CHECK GLUCOSE TWICE DAILY AS DIRECTED. E11.9 12/19/22   Latrelle Dodrill, MD  Lancets [facility] DELICA PLUS LANCET33G) MISC USE TO CHECK GLUCOSE IN THE MORNING AND AT BEDTIME. E11.9 02/06/23   Latrelle Dodrill, MD  pregabalin (LYRICA) 50 MG capsule Take 1 capsule (50 mg total) by mouth 2 (two) times daily as needed. Patient not taking: Reported on 06/19/2023 06/10/23   Wallis Bamberg, PA-C  trospium (SANCTURA) 20 MG tablet TAKE 1 TABLET BY MOUTH 2 TIMES A DAY Patient not taking: Reported on 06/19/2023 03/20/23   Selmer Dominion, NP      Allergies    Amoxicillin, Azithromycin, Butalbital, Clindamycin hcl, Januvia [sitagliptin], Levaquin [levofloxacin in d5w], Meperidine hcl, Sulfamethoxazole, Fexofenadine hcl, Ketorolac, Macrodantin [nitrofurantoin macrocrystal], Methocarbamol, Nitrofurantoin, Pioglitazone, Tramadol, Codeine phosphate, Etodolac, Gabapentin, Naproxen, Pentazocine-naloxone hcl, Sumatriptan, Trimethoprim, and Zonegran    Review of Systems   Review of Systems   Genitourinary:  Positive for flank pain.    Physical Exam Updated Vital Signs BP 116/73   Pulse 98   Temp 98.1 F (36.7 C) (Oral)   Resp 20   Wt 79.4 kg   SpO2 96%   BMI 35.35 kg/m  Physical Exam Vitals and nursing note reviewed.  Constitutional:      Appearance: She is well-developed.     Comments: Appears to be in pain  HENT:     Head: Normocephalic and atraumatic.     Right Ear: External ear normal.     Left Ear: External ear normal.  Eyes:     General: No scleral icterus.       Right eye: No discharge.        Left eye: No discharge.     Conjunctiva/sclera: Conjunctivae normal.  Neck:     Trachea: No tracheal deviation.  Cardiovascular:     Rate and Rhythm: Normal rate and regular rhythm.  Pulmonary:     Effort: Pulmonary effort is normal. No respiratory distress.     Breath sounds:  Normal breath sounds. No stridor. No wheezing or rales.  Abdominal:     General: Bowel sounds are normal. There is no distension.     Palpations: Abdomen is soft.     Tenderness: There is no abdominal tenderness. There is right CVA tenderness. There is no guarding or rebound.  Musculoskeletal:        General: No tenderness or deformity.     Cervical back: Neck supple.  Skin:    General: Skin is warm and dry.     Findings: No rash.  Neurological:     General: No focal deficit present.     Mental Status: She is alert.     Cranial Nerves: No cranial nerve deficit, dysarthria or facial asymmetry.     Sensory: No sensory deficit.     Motor: No abnormal muscle tone or seizure activity.     Coordination: Coordination normal.  Psychiatric:        Mood and Affect: Mood normal.     ED Results / Procedures / Treatments   Labs (all labs ordered are listed, but only abnormal results are displayed) Labs Reviewed  COMPREHENSIVE METABOLIC PANEL - Abnormal; Notable for the following components:      Result Value   Potassium 3.4 (*)    Glucose, Bld 134 (*)    AST 72 (*)    Total  Bilirubin 1.5 (*)    All other components within normal limits  CBC WITH DIFFERENTIAL/PLATELET - Abnormal; Notable for the following components:   RDW 15.8 (*)    All other components within normal limits  CULTURE, BLOOD (ROUTINE X 2)  CULTURE, BLOOD (ROUTINE X 2)  PROTIME-INR  URINALYSIS, W/ REFLEX TO CULTURE (INFECTION SUSPECTED)  I-STAT CG4 LACTIC ACID, ED    EKG None  Radiology DG Chest 1 View  Result Date: 06/19/2023 CLINICAL DATA:  Sepsis, right flank pain. Nausea, chills, and diaphoresis. EXAM: CHEST  1 VIEW COMPARISON:  05/17/2023. FINDINGS: The heart size and mediastinal contours are within normal limits. There is atherosclerotic calcification of the aorta. No consolidation, effusion, or pneumothorax. No acute osseous abnormality. IMPRESSION: No active disease. Electronically Signed   By: Thornell Sartorius M.D.   On: 06/19/2023 23:42    Procedures Procedures    Medications Ordered in ED Medications  morphine (PF) 4 MG/ML injection 4 mg (4 mg Intravenous Given 06/19/23 2018)  ondansetron (ZOFRAN) injection 4 mg (4 mg Intravenous Given 06/19/23 2018)  morphine (PF) 4 MG/ML injection 4 mg (4 mg Intravenous Given 06/19/23 2127)    ED Course/ Medical Decision Making/ A&P Clinical Course as of 06/19/23 2346  Fri Jun 19, 2023  2116 CBC normal.  Metabolic panel normal.  Lactic acid level normal [JK]    Clinical Course User Index [JK] Linwood Dibbles, MD                                 Medical Decision Making Amount and/or Complexity of Data Reviewed Labs: ordered. Radiology: ordered.  Risk Prescription drug management.   Patient presented to the ED with complaints of flank pain.  Recently diagnosed with a UTI.  Initially tachycardic was concerned about the possibility of systemic infection pyelonephritis ureterolithiasis.  Patient was treated with IV pain medications.  Heart rate has improved.  Laboratory test did not show any signs of leukocytosis.  Normal renal function.   Doubt systemic infection.  Urinalysis and CT scan are still pending.  Care  turned over to Dr. Daun Peacock at shift change        Final Clinical Impression(s) / ED Diagnoses Final diagnoses:  None    Rx / DC Orders ED Discharge Orders     None         Linwood Dibbles, MD 06/19/23 2346

## 2023-06-19 NOTE — ED Triage Notes (Signed)
Presents from home for R flank pain since Wednesday.  She was treated with cipro x 10 days and rocephin injection at [facility] a UTI. She has also been taking oxycodone. Endorses, nausea, chills, diaphoresis, incontinence (chronic) Denies fever, blood in urine   No h/o kidney stones.

## 2023-06-19 NOTE — Telephone Encounter (Signed)
Patient calls nurse line requesting refill on pain medication.   She reports that she needs refill due to being prescribed 1 whole tablet while managing pain from recent kidney infection.   She had recent visit with NP from [facility]. She suggested that due to amount of pain patient continues to have, she is worried about a kidney stone.   Advised patient that I would agree with NP recommendation due to severity of continued pain and minimal relief from pain medication.   Patient states that she will go to [facility] for further evaluation.   Veronda Prude, RN

## 2023-06-20 MED ORDER — ACETAMINOPHEN 500 MG PO TABS
1000.0000 mg | ORAL_TABLET | Freq: Once | ORAL | Status: AC
  Administered 2023-06-20: 1000 mg via ORAL
  Filled 2023-06-20: qty 2

## 2023-06-22 ENCOUNTER — Other Ambulatory Visit: Payer: Self-pay

## 2023-06-22 ENCOUNTER — Encounter (HOSPITAL_COMMUNITY): Payer: Self-pay

## 2023-06-22 ENCOUNTER — Emergency Department (HOSPITAL_COMMUNITY): Payer: PPO

## 2023-06-22 ENCOUNTER — Ambulatory Visit: Payer: PPO | Admitting: Student

## 2023-06-22 ENCOUNTER — Emergency Department (HOSPITAL_COMMUNITY)
Admission: EM | Admit: 2023-06-22 | Discharge: 2023-06-23 | Disposition: A | Discharge status: Home / Self Care | Payer: PPO | Attending: Emergency Medicine | Admitting: Emergency Medicine

## 2023-06-22 VITALS — Ht 59.0 in | Wt 174.0 lb

## 2023-06-22 DIAGNOSIS — Z79899 Other long term (current) drug therapy: Secondary | ICD-10-CM | POA: Diagnosis not present

## 2023-06-22 DIAGNOSIS — I7143 Infrarenal abdominal aortic aneurysm, without rupture: Secondary | ICD-10-CM | POA: Diagnosis not present

## 2023-06-22 DIAGNOSIS — M549 Dorsalgia, unspecified: Secondary | ICD-10-CM | POA: Diagnosis not present

## 2023-06-22 DIAGNOSIS — I2510 Atherosclerotic heart disease of native coronary artery without angina pectoris: Secondary | ICD-10-CM | POA: Diagnosis not present

## 2023-06-22 DIAGNOSIS — R1030 Lower abdominal pain, unspecified: Secondary | ICD-10-CM | POA: Diagnosis not present

## 2023-06-22 DIAGNOSIS — M546 Pain in thoracic spine: Secondary | ICD-10-CM | POA: Insufficient documentation

## 2023-06-22 LAB — CBC WITH DIFFERENTIAL/PLATELET
Abs Immature Granulocytes: 0.02 10*3/uL (ref 0.00–0.07)
Basophils Absolute: 0.1 10*3/uL (ref 0.0–0.1)
Basophils Relative: 1 %
Eosinophils Absolute: 0.1 10*3/uL (ref 0.0–0.5)
Eosinophils Relative: 1 %
HCT: 41.2 % (ref 36.0–46.0)
Hemoglobin: 13.1 g/dL (ref 12.0–15.0)
Immature Granulocytes: 0 %
Lymphocytes Relative: 17 %
Lymphs Abs: 1.2 10*3/uL (ref 0.7–4.0)
MCH: 30.3 pg (ref 26.0–34.0)
MCHC: 31.8 g/dL (ref 30.0–36.0)
MCV: 95.2 fL (ref 80.0–100.0)
Monocytes Absolute: 0.6 10*3/uL (ref 0.1–1.0)
Monocytes Relative: 9 %
Neutro Abs: 4.9 10*3/uL (ref 1.7–7.7)
Neutrophils Relative %: 72 %
Platelets: 182 10*3/uL (ref 150–400)
RBC: 4.33 MIL/uL (ref 3.87–5.11)
RDW: 15.7 % — ABNORMAL HIGH (ref 11.5–15.5)
WBC: 6.9 10*3/uL (ref 4.0–10.5)
nRBC: 0 % (ref 0.0–0.2)

## 2023-06-22 LAB — COMPREHENSIVE METABOLIC PANEL
ALT: 35 U/L (ref 0–44)
AST: 63 U/L — ABNORMAL HIGH (ref 15–41)
Albumin: 4.3 g/dL (ref 3.5–5.0)
Alkaline Phosphatase: 64 U/L (ref 38–126)
Anion gap: 9 (ref 5–15)
BUN: 13 mg/dL (ref 8–23)
CO2: 23 mmol/L (ref 22–32)
Calcium: 9.7 mg/dL (ref 8.9–10.3)
Chloride: 107 mmol/L (ref 98–111)
Creatinine, Ser: 0.86 mg/dL (ref 0.44–1.00)
GFR, Estimated: >60 mL/min (ref >60)
Glucose, Bld: 106 mg/dL — ABNORMAL HIGH (ref 70–99)
Potassium: 3.8 mmol/L (ref 3.5–5.1)
Sodium: 139 mmol/L (ref 135–145)
Total Bilirubin: 1.3 mg/dL — ABNORMAL HIGH (ref <1.2)
Total Protein: 7 g/dL (ref 6.5–8.1)

## 2023-06-22 LAB — LIPASE, BLOOD: Lipase: 42 U/L (ref 11–51)

## 2023-06-22 MED ORDER — MORPHINE SULFATE (PF) 4 MG/ML IV SOLN
4.0000 mg | Freq: Once | INTRAVENOUS | Status: DC
  Administered 2023-06-22: 4 mg via INTRAVENOUS
  Filled 2023-06-22: qty 1

## 2023-06-22 MED ORDER — ONDANSETRON HCL 4 MG/2ML IJ SOLN
4.0000 mg | Freq: Once | INTRAMUSCULAR | Status: AC
  Administered 2023-06-22: 4 mg via INTRAVENOUS
  Filled 2023-06-22: qty 2

## 2023-06-22 MED ORDER — MORPHINE SULFATE (PF) 4 MG/ML IV SOLN
4.0000 mg | Freq: Once | INTRAVENOUS | Status: AC
  Administered 2023-06-22: 4 mg via INTRAMUSCULAR
  Filled 2023-06-22: qty 1

## 2023-06-22 MED ORDER — LIDOCAINE 5 % EX PTCH
1.0000 | MEDICATED_PATCH | CUTANEOUS | Status: DC
  Administered 2023-06-23: 1 via TRANSDERMAL
  Filled 2023-06-22 (×2): qty 1

## 2023-06-22 MED ORDER — IOHEXOL 350 MG/ML SOLN
100.0000 mL | Freq: Once | INTRAVENOUS | Status: AC | PRN
  Administered 2023-06-22: 100 mL via INTRAVENOUS

## 2023-06-22 MED ORDER — MORPHINE SULFATE (PF) 4 MG/ML IV SOLN
4.0000 mg | Freq: Once | INTRAVENOUS | Status: DC
  Filled 2023-06-22: qty 1

## 2023-06-22 NOTE — Assessment & Plan Note (Addendum)
Present for 3 weeks, refractory to Percocet.  I am a bit concerned in this case given the history of AAA with a dissection flap seen on prior CTA that there may be a dissection component contributing to her pain at this time.  Therefore, discussed attempting STAT outpatient imaging versus ER presentation and she elects for ER presentation.  We offered to wheeled her over to the [facility], ER or offered EMS transportation, she prefers to go by POV. Other considerations include shingles given her exquisite sensitivity to even light touch and the isolated ?vesicle in the affected area. -Patient to present to ED via POV -Would recommend the patient receive a CTA of the abdomen/pelvis to evaluate for dissection of her known AAA -Consider Toradol in the office today for acute pain control but she has a history of lip swelling with this medication in the past so deferred, we do not stock narcotics in this office so pain options are limited

## 2023-06-22 NOTE — ED Notes (Signed)
Pt asked for something to drink; notified nurse & nurse made pt aware that she has to wait for scans to come back before drinking anything.

## 2023-06-22 NOTE — ED Provider Notes (Signed)
[place] EMERGENCY DEPARTMENT AT [facility] Provider Note   CSN: [id number] Arrival date & time: 06/22/23  1550     History  Chief Complaint  Patient presents with   Back Pain    [name] is a 82 y.o. female.  82 yo F with a chief complaints of right-sided thoracic back pain.  This has been going on for some time now.  She has been seen by her family doctor as well as in the emergency department setting multiple times for this.  She has had a renal stone study performed that was negative.  She saw her family doctor today and there was some concern that this could be due to her aorta and they encouraged her to be evaluated.  Nothing seems to make it better or worse then later she tells me it is worse to turn into a push on the area.  She denies trauma.  Denies cough or congestion.  She for like she was having trouble urinating earlier but this seems to have resolved.   Back Pain      Home Medications Prior to Admission medications   Medication Sig Start Date End Date Taking? Authorizing Provider  acetic acid-hydrocortisone (VOSOL-HC) OTIC solution Place 3 drops into both ears 2 (two) times daily. Patient not taking: Reported on 06/19/2023 02/28/21   Moses Manners, MD  Artificial Tear Ointment (DRY EYES OP) Apply 1 drop to eye 2 (two) times daily as needed (dry eyes).    [provider]  baclofen (LIORESAL) 20 MG tablet Take 1 tablet (20 mg total) by mouth 3 (three) times daily. 03/30/23   Caro Laroche, DO  benzonatate (TESSALON PERLES) 100 MG capsule Take 2 capsules (200 mg total) by mouth 3 (three) times daily as needed for cough. Patient not taking: Reported on 06/19/2023 12/22/22   Sabino Dick, DO  busPIRone (BUSPAR) 5 MG tablet Take 1 tablet (5 mg total) by mouth 3 (three) times daily. Patient taking differently: Take 5 mg by mouth 2 (two) times daily. 09/24/22   Moses Manners, MD  cetirizine (ZYRTEC) 5 MG tablet Take 1 tablet (5 mg  total) by mouth daily as needed for allergies. Patient not taking: Reported on 06/19/2023 04/08/23   Bess Kinds, MD  ciprofloxacin (CIPRO) 500 MG tablet Take 1 tablet (500 mg total) by mouth 2 (two) times daily. 06/10/23   Wallis Bamberg, PA-C  Clobetasol Prop Emollient Base (CLOBETASOL PROPIONATE E) 0.05 % emollient cream Apply 2 Applications topically at bedtime. Patient not taking: Reported on 06/19/2023 11/10/22   Selmer Dominion, NP  D-Mannose 500 MG CAPS Take 1 capsule by mouth daily. Patient not taking: Reported on 06/19/2023 02/23/23   Selmer Dominion, NP  DULoxetine (CYMBALTA) 30 MG capsule TAKE ONE CAPSULE BY MOUTH DAILY 09/25/22   Moses Manners, MD  enalapril (VASOTEC) 10 MG tablet Take 1 tablet (10 mg total) by mouth daily. 12/12/22   Latrelle Dodrill, MD  estradiol (ESTRACE) 0.1 MG/GM vaginal cream Place 0.5g nightly for two weeks then twice a week after 03/13/22   Marguerita Beards, MD  famotidine (PEPCID) 40 MG tablet Take 1 tablet (40 mg total) by mouth daily. 06/02/23   Caro Laroche, DO  fexofenadine (ALLEGRA) 180 MG tablet Take 1 tablet (180 mg total) by mouth daily. 08/18/22   Moses Manners, MD  glucose blood (ONETOUCH VERIO) test strip USE  STRIP TO CHECK GLUCOSE TWICE DAILY AS DIRECTED. E11.9 12/19/22  Latrelle Dodrill, MD  Lancets [facility] DELICA PLUS LANCET33G) MISC USE TO CHECK GLUCOSE IN THE MORNING AND AT BEDTIME. E11.9 02/06/23   Latrelle Dodrill, MD  metoprolol succinate (TOPROL-XL) 25 MG 24 hr tablet TAKE ONE TABLET BY MOUTH DAILY 08/07/22   Moses Manners, MD  mometasone (NASONEX) 50 MCG/ACT nasal spray Place 2 sprays into the nose 2 (two) times daily. Patient taking differently: Place 2 sprays into the nose 2 (two) times daily as needed. 05/29/23   Ashok Croon, MD  Multiple Vitamin (MULTIVITAMIN WITH MINERALS) TABS tablet Take 1 tablet by mouth daily.    [provider]  Multiple Vitamins-Minerals (ICAPS AREDS 2 PO) Take 1 tablet  by mouth in the morning and at bedtime.    [provider]  ondansetron (ZOFRAN-ODT) 4 MG disintegrating tablet Take 1 tablet (4 mg total) by mouth every 8 (eight) hours as needed for nausea or vomiting. 05/17/23   Rondel Baton, MD  oxyCODONE-acetaminophen (PERCOCET/ROXICET) 5-325 MG tablet Take 0.5 tablets by mouth every 6 (six) hours as needed for severe pain (pain score 7-10). 06/02/23   Caro Laroche, DO  pantoprazole (PROTONIX) 40 MG tablet TAKE ONE TABLET BY MOUTH DAILY 09/15/22   Moses Manners, MD  pregabalin (LYRICA) 50 MG capsule Take 1 capsule (50 mg total) by mouth 2 (two) times daily as needed. Patient not taking: Reported on 06/19/2023 06/10/23   Wallis Bamberg, PA-C  rosuvastatin (CRESTOR) 20 MG tablet Take 1 tablet (20 mg total) by mouth daily. 03/27/23   Caro Laroche, DO  Semaglutide, 1 MG/DOSE, (OZEMPIC, 1 MG/DOSE,) 2 MG/1.5ML SOPN Inject 2 mg into the skin once a week. Patient taking differently: Inject 1 mg into the skin every Sunday. 01/14/21   Moses Manners, MD  tiZANidine (ZANAFLEX) 4 MG tablet Take 0.5 tablets (2 mg total) by mouth 2 (two) times daily as needed for muscle spasms (back pain). 05/29/23   Lincoln Brigham, MD  triamcinolone ointment (KENALOG) 0.5 % Apply 1 application topically 2 (two) times daily. For elbows Patient taking differently: Apply 1 application  topically 2 (two) times daily as needed (elbow itching). 10/10/19   Moses Manners, MD  trospium (SANCTURA) 20 MG tablet TAKE 1 TABLET BY MOUTH 2 TIMES A DAY Patient not taking: Reported on 06/19/2023 03/20/23   Selmer Dominion, NP      Allergies    Amoxicillin, Azithromycin, Butalbital, Clindamycin hcl, Januvia [sitagliptin], Levaquin [levofloxacin in d5w], Meperidine hcl, Sulfamethoxazole, Fexofenadine hcl, Ketorolac, Macrodantin [nitrofurantoin macrocrystal], Methocarbamol, Nitrofurantoin, Pioglitazone, Tramadol, Codeine phosphate, Etodolac, Gabapentin, Naproxen, Pentazocine-naloxone hcl,  Sumatriptan, Trimethoprim, and Zonegran    Review of Systems   Review of Systems  Musculoskeletal:  Positive for back pain.    Physical Exam Updated Vital Signs BP (!) 143/76 (BP Location: Left Arm)   Pulse 95   Temp 98 F (36.7 C) (Oral)   Resp 18   Ht 4\' 11"  (1.499 m)   Wt 78.9 kg   SpO2 94%   BMI 35.13 kg/m  Physical Exam Vitals and nursing note reviewed.  Constitutional:      General: She is not in acute distress.    Appearance: She is well-developed. She is not diaphoretic.     Comments: Patient is constantly moving  HENT:     Head: Normocephalic and atraumatic.  Eyes:     Pupils: Pupils are equal, round, and reactive to light.  Cardiovascular:     Rate and Rhythm: Normal rate and regular rhythm.  Heart sounds: No murmur heard.    No friction rub. No gallop.  Pulmonary:     Effort: Pulmonary effort is normal.     Breath sounds: No wheezing or rales.  Abdominal:     General: There is no distension.     Palpations: Abdomen is soft.     Tenderness: There is no abdominal tenderness.  Musculoskeletal:        General: No tenderness.     Cervical back: Normal range of motion and neck supple.     Comments: Pain along the musculature along the lower T-spine and the paraspinal musculature to the right reproduces her discomfort.  I do not appreciate any obvious rash.  Skin:    General: Skin is warm and dry.  Neurological:     Mental Status: She is alert and oriented to person, place, and time.  Psychiatric:        Behavior: Behavior normal.     ED Results / Procedures / Treatments   Labs (all labs ordered are listed, but only abnormal results are displayed) Labs Reviewed  CBC WITH DIFFERENTIAL/PLATELET - Abnormal; Notable for the following components:      Result Value   RDW 15.7 (*)    All other components within normal limits  COMPREHENSIVE METABOLIC PANEL - Abnormal; Notable for the following components:   Glucose, Bld 106 (*)    AST 63 (*)    Total  Bilirubin 1.3 (*)    All other components within normal limits  LIPASE, BLOOD  URINALYSIS, ROUTINE W REFLEX MICROSCOPIC    EKG None  Radiology No results found.  Procedures Procedures    Medications Ordered in ED Medications  lidocaine (LIDODERM) 5 % 1 patch (1 patch Transdermal Patient Refused/Not Given 06/22/23 2215)  morphine (PF) 4 MG/ML injection 4 mg (4 mg Intramuscular Not Given 06/22/23 2225)  morphine (PF) 4 MG/ML injection 4 mg (4 mg Intramuscular Given 06/22/23 1729)  ondansetron (ZOFRAN) injection 4 mg (4 mg Intravenous Given 06/22/23 2217)  iohexol (OMNIPAQUE) 350 MG/ML injection 100 mL (100 mLs Intravenous Contrast Given 06/22/23 2247)    ED Course/ Medical Decision Making/ A&P                                 Medical Decision Making Amount and/or Complexity of Data Reviewed Radiology: ordered.  Risk Prescription drug management.   82 yo F with a chief complaints of right-sided back pain.  By history this seems most likely to be musculoskeletal.  Worse with palpation twisting movement.  She is presenting a little bit like I would expect for a kidney stone as she keeps moving to try and find a comfortable position.  Her family doctor was worried about acute aortic dissection.  Most recent CT stone study with AAA without significant change from [place].  I will obtain CT imaging.  Treat pain and nausea.  She had blood work without significant electrolyte abnormality, renal functions at baseline.  No acute anemia.  Awaiting Ct imaging.   Patient care signed out to Dr. Durwin Nora, please see their note for further details of care in the ED.  The patients results and plan were reviewed and discussed.   Any x-rays performed were independently reviewed by myself.   Differential diagnosis were considered with the presenting HPI.  Medications  lidocaine (LIDODERM) 5 % 1 patch (1 patch Transdermal Patient Refused/Not Given 06/22/23 2215)  morphine (PF) 4 MG/ML injection  4  mg (4 mg Intramuscular Not Given 06/22/23 2225)  morphine (PF) 4 MG/ML injection 4 mg (4 mg Intramuscular Given 06/22/23 1729)  ondansetron (ZOFRAN) injection 4 mg (4 mg Intravenous Given 06/22/23 2217)  iohexol (OMNIPAQUE) 350 MG/ML injection 100 mL (100 mLs Intravenous Contrast Given 06/22/23 2247)    Vitals:   06/22/23 1635 06/22/23 1700 06/22/23 2021  BP: (!) 156/83  (!) 143/76  Pulse: 92  95  Resp: 18  18  Temp: 98.1 F (36.7 C)  98 F (36.7 C)  TempSrc: Oral  Oral  SpO2: 98%  94%  Weight:  78.9 kg   Height:  4\' 11"  (1.499 m)     Final diagnoses:  Acute right-sided thoracic back pain    Admission/ observation were discussed with the admitting physician, patient and/or family and they are comfortable with the plan.          Final Clinical Impression(s) / ED Diagnoses Final diagnoses:  Acute right-sided thoracic back pain    Rx / DC Orders ED Discharge Orders     None         Melene Plan, DO 06/22/23 2306

## 2023-06-22 NOTE — ED Provider Triage Note (Signed)
Emergency Medicine Provider Triage Evaluation Note  [name] , a 82 y.o. female  was evaluated in triage.  Pt complains of back pain.  Review of Systems  Positive: Pain right sub scapula, nausea, can't pee Negative: Fever, SOB, chest pain  Physical Exam  BP (!) 156/83 (BP Location: Left Arm)   Pulse 92   Temp 98.1 F (36.7 C) (Oral)   Resp 18   Ht 4\' 11"  (1.499 m)   Wt 78.9 kg   SpO2 98%   BMI 35.13 kg/m  Gen:   Awake,  Mild distress with pain Resp:  Normal effort  MSK:   Moves extremities without difficulty  Other:  Tender to inferior border of right scapula.   Medical Decision Making  Medically screening exam initiated at 5:02 PM.  Appropriate orders placed.  [name] was informed that the remainder of the evaluation will be completed by another provider, this initial triage assessment does not replace that evaluation, and the importance of remaining in the ED until their evaluation is complete.  Patient seen recently 2 days ago, thought it was a kidney stone but no evidence of stone on work up. Still having severe pain, and reports she can't urinate. No fever. Takes oxycodone on a regular basis.    [name], PA-C 06/22/23 1704

## 2023-06-22 NOTE — ED Triage Notes (Signed)
Patient is here for evaluation of right upper back/under the shoulder. Reports been going on since Friday.

## 2023-06-22 NOTE — Assessment & Plan Note (Signed)
>>  ASSESSMENT AND PLAN FOR ACUTE RIGHT-SIDED THORACIC BACK PAIN WRITTEN ON 06/22/2023  3:17 PM BY Alicia Amel, MD  Present for 3 weeks, refractory to Percocet.  I am a bit concerned in this case given the history of AAA with a dissection flap seen on prior CTA that there may be a dissection component contributing to her pain at this time.  Therefore, discussed attempting STAT outpatient imaging versus ER presentation and she elects for ER presentation.  We offered to wheeled her over to the [facility], ER or offered EMS transportation, she prefers to go by POV. Other considerations include shingles given her exquisite sensitivity to even light touch and the isolated ?vesicle in the affected area. -Patient to present to ED via POV -Would recommend the patient receive a CTA of the abdomen/pelvis to evaluate for dissection of her known AAA -Consider Toradol in the office today for acute pain control but she has a history of lip swelling with this medication in the past so deferred, we do not stock narcotics in this office so pain options are limited

## 2023-06-22 NOTE — ED Provider Notes (Signed)
Care of patient assumed from Dr. Adela Lank.  This patient presents with subacute thoracic back pain.  Underwent a recent stone study which showed concern aortic concern.  She was sent in by PCP.  Currently waiting radiology read of aortogram.  If negative, discharge. Physical Exam  BP (!) 143/76 (BP Location: Left Arm)   Pulse 95   Temp 98 F (36.7 C) (Oral)   Resp 18   Ht 4\' 11"  (1.499 m)   Wt 78.9 kg   SpO2 94%   BMI 35.13 kg/m   Physical Exam Vitals and nursing note reviewed.  Constitutional:      General: She is not in acute distress.    Appearance: Normal appearance. She is well-developed. She is not ill-appearing, toxic-appearing or diaphoretic.  HENT:     Head: Normocephalic and atraumatic.     Right Ear: External ear normal.     Left Ear: External ear normal.     Nose: Nose normal.     Mouth/Throat:     Mouth: Mucous membranes are moist.  Eyes:     Extraocular Movements: Extraocular movements intact.     Conjunctiva/sclera: Conjunctivae normal.  Cardiovascular:     Rate and Rhythm: Normal rate and regular rhythm.  Pulmonary:     Effort: Pulmonary effort is normal. No respiratory distress.  Abdominal:     General: There is no distension.     Palpations: Abdomen is soft.     Tenderness: There is no abdominal tenderness.  Musculoskeletal:        General: No swelling. Normal range of motion.     Cervical back: Normal range of motion and neck supple.  Skin:    General: Skin is warm and dry.     Coloration: Skin is not jaundiced or pale.  Neurological:     General: No focal deficit present.     Mental Status: She is alert and oriented to person, place, and time.  Psychiatric:        Mood and Affect: Mood normal.        Behavior: Behavior normal.     Procedures  Procedures  ED Course / MDM    Medical Decision Making Amount and/or Complexity of Data Reviewed Radiology: ordered.  Risk Prescription drug management.   CTA did not show any acute aortic  abnormalities.  While in the ED, there was some concern of hypoxia.  On my assessment, patient maintains SpO2 in the low to mid 90s on room air.  Hypoxia likely related to artifact given her frequent movements.  When patient is in bed, she is constantly moving around try to get comfortable.  She states that when she is at home, she is only able to get comfortable on the floor.  She was ordered multimodal pain medications which she initially declined.  She was ultimately agreeable to different types of pain medicine.  At home, she takes Percocet.  She avoids NSAIDs due to history of gastric bleeding.  She was advised to follow-up with primary care doctor for ongoing management of chronic pain.  She was discharged in stable condition.       Gloris Manchester, MD 06/23/23 [phone]

## 2023-06-22 NOTE — Patient Instructions (Signed)
[name],  I am so sorry that you are having this pain.  As per our discussion today, I think that going over to the emergency room is wise.  Given your history of having a dissection flap of your abdominal aorta, I believe this is going to be the fastest way to get a CT angiogram of your aorta to ensure that this is not from an expansion of that dissection. They will also be able to more effectively treat your pain right now than we can today in clinic.  Eliezer Mccoy, MD

## 2023-06-22 NOTE — ED Notes (Signed)
Patient has been to the desk about 4 times about the wait time. I explained to her about the process.  She came back up and stated "the pain medication is not working anymore".  I made the charge nurse aware and she stated "ok".

## 2023-06-22 NOTE — Progress Notes (Signed)
    SUBJECTIVE:   CHIEF COMPLAINT / HPI:   Thoracic Back Pain Patient coming in today to follow-up on acute right-sided thoracic back pain.  She had a an E Coli UTI dx on 10/30 that was treated with Cipro Seen in the ED 11/8 due to flank pain>blood cx negative and CT without stone or intraabdominal process.  However, pain has only intensified since that time, she is now unable to get comfortable in any position.  The only thing that helps even a little bit is lying flat on the floor.  She is not resting well, she is taking Percocet 5-325mg  every 6 hours with no improvement.   OBJECTIVE:   Ht 4\' 11"  (1.499 m)   Wt 174 lb (78.9 kg)   BMI 35.14 kg/m   Gen: Very uncomfortable, restless HEENT: Mucous membranes are moist Cardio: Regular rate and rhythm, does become tachycardic with with episodes of back pain Pulm: Speaking in short sentences secondary to pain Skin: There is one papule vs healing vesicle in the area of maximal tenderness but no obvious rash to suggest shingles  Neuro : Speech fluent, antalgic gait, walking under her own strength with the assistance of a walker     ASSESSMENT/PLAN:   Acute right-sided thoracic back pain Present for 3 weeks, refractory to Percocet.  I am a bit concerned in this case given the history of AAA with a dissection flap seen on prior CTA that there may be a dissection component contributing to her pain at this time.  Therefore, discussed attempting STAT outpatient imaging versus ER presentation and she elects for ER presentation.  We offered to wheeled her over to the [facility], ER or offered EMS transportation, she prefers to go by POV. Other considerations include shingles given her exquisite sensitivity to even light touch and the isolated ?vesicle in the affected area. -Patient to present to ED via POV -Would recommend the patient receive a CTA of the abdomen/pelvis to evaluate for dissection of her known AAA -Consider Toradol in the office  today for acute pain control but she has a history of lip swelling with this medication in the past so deferred, we do not stock narcotics in this office so pain options are limited     J Dorothyann Gibbs, MD [facility]

## 2023-06-23 ENCOUNTER — Telehealth: Payer: Self-pay

## 2023-06-23 ENCOUNTER — Other Ambulatory Visit: Payer: Self-pay | Admitting: Student

## 2023-06-23 MED ORDER — OXYCODONE-ACETAMINOPHEN 5-325 MG PO TABS: 1.0000 | ORAL_TABLET | Freq: Once | ORAL | Status: DC

## 2023-06-23 MED ORDER — LIDOCAINE 5 % EX OINT: 1.0000 | TOPICAL_OINTMENT | CUTANEOUS | 0 refills | Status: DC | PRN

## 2023-06-23 MED ORDER — OXYCODONE-ACETAMINOPHEN 5-325 MG PO TABS
1.0000 | ORAL_TABLET | Freq: Once | ORAL | Status: AC
  Administered 2023-06-23: 1 via ORAL
  Filled 2023-06-23: qty 1

## 2023-06-23 MED ORDER — CYCLOBENZAPRINE HCL 10 MG PO TABS
5.0000 mg | ORAL_TABLET | Freq: Once | ORAL | Status: AC
  Administered 2023-06-23: 5 mg via ORAL
  Filled 2023-06-23: qty 1

## 2023-06-23 MED ORDER — KETOROLAC TROMETHAMINE 15 MG/ML IJ SOLN
15.0000 mg | Freq: Once | INTRAMUSCULAR | Status: AC
  Administered 2023-06-23: 15 mg via INTRAVENOUS
  Filled 2023-06-23: qty 1

## 2023-06-23 MED ORDER — VALACYCLOVIR HCL 1 G PO TABS: 1000.0000 mg | ORAL_TABLET | Freq: Three times a day (TID) | ORAL | 0 refills | Status: AC

## 2023-06-23 MED ORDER — IBUPROFEN 200 MG PO TABS
600.0000 mg | ORAL_TABLET | Freq: Once | ORAL | Status: DC
  Filled 2023-06-23: qty 3

## 2023-06-23 NOTE — ED Notes (Signed)
Lidocaine patch applied to the upper back on the right side.

## 2023-06-23 NOTE — ED Notes (Signed)
Pt niece Missy called at this time per pt request and notified pt is ready for d/c home. Informed she will be here in a few minutes to take pt home. Pt updated.

## 2023-06-23 NOTE — ED Notes (Signed)
Assisted pt to the restroom via wheelchair, cleaned pt & put on a clean brief, assisted pt back to room and onto the bed; placed wet clothes in personal pt bag

## 2023-06-23 NOTE — ED Notes (Signed)
Pt reports she is afraid to go home alone in a cab for discharge. Informed her niece can come get her but she thinks it will be several hours after 0700. Pt reports she lives home alone and has no one else besides her niece to take her home. Kathryne Hitch, Press photographer notified.

## 2023-06-23 NOTE — Discharge Instructions (Signed)
Your CT scan and not show any new findings.  Talk to your primary care doctor for ongoing management of your pain.  Return to the emergency department for any new or worsening symptoms of concern.

## 2023-06-23 NOTE — Telephone Encounter (Signed)
Left message informing patient her novo nordisk shipment is ready for pickup.  4 boxes of Ozempic 1mg  dose pens are ready in med room fridge

## 2023-06-23 NOTE — Telephone Encounter (Signed)
Patient returns call to nurse line.   Advised of message per Dr. Marisue Humble. She is requesting prescription for Lidocaine 5% patch. She was given this at the ED yesterday and feels that it is helping with her pain. Advised that Dr. Marisue Humble had sent in rx for lidocaine gel. She requests prescription for patches instead.   She is also requesting refill on her oxycodone-acetaminophen 5-325 mg. She reports that Dr. Linwood Dibbles advised her to take 1 full tablet due to pain levels. She has been taking 1 tablet every 6 hours.   Will forward to Dr. Marisue Humble and Dr. Manson Passey (covering for Dr. Linwood Dibbles).   Veronda Prude, RN

## 2023-06-23 NOTE — ED Notes (Signed)
Discussed with Dr. Durwin Nora regarding pt respiratory status at this time. Pt denies shortness of breath, SPO2 readings continuously low. Pt encouraged to deep breathe and pt returns to 92% on room air but then drops again once nursing leaves the room. Assessed pt regarding pain level, and if pt not breathing deeply because pain is worse with deep inspiration which pt adamantly denies. Pt denies OSA. Pt reports "I am deep breathing" when assessed if she is feeling short of breath currently. Pt in NAD at this time. Pt placed on 2L [place] for comfort at this time.

## 2023-06-23 NOTE — Progress Notes (Signed)
Patient presented to the ED. Labwork was largely benign. CTA of the Aorta did not show extension of her dissection flap, reassuringly. Based on our discussion yesterday, will treat this as though it is shingles, though the presentation and skin findings are far from typical, her exquisite sensitivity to even light touch points in this direction. - Valtrex x7 days - Lidocaine patch - Would consider gabapentin or lyrica as a possible next step if thought to be post-herpetic pain syndrome - Could consider MRI of the T spine, but pain really seems more localized across the R flank than the spine itself   J Dorothyann Gibbs, MD

## 2023-06-23 NOTE — Telephone Encounter (Signed)
Patient LVM on nurse line requesting direct admission to hospital by Dr. Marisue Humble.   She reports that she continues to have severe pain.   Spoke with Dr. Marisue Humble who advised that he would not be able to directly admit patient into the hospital.  He did advise that he would send treatment to pharmacy for shingles.   I attempted to return call to patient. She did not answer, LVM asking that she return call to office.   Veronda Prude, RN

## 2023-06-24 DIAGNOSIS — Z6834 Body mass index (BMI) 34.0-34.9, adult: Secondary | ICD-10-CM | POA: Diagnosis not present

## 2023-06-24 DIAGNOSIS — M48061 Spinal stenosis, lumbar region without neurogenic claudication: Secondary | ICD-10-CM | POA: Diagnosis not present

## 2023-06-24 LAB — CULTURE, BLOOD (ROUTINE X 2)
Culture: NO GROWTH
Culture: NO GROWTH
Special Requests: ADEQUATE

## 2023-06-24 MED ORDER — OXYCODONE-ACETAMINOPHEN 5-325 MG PO TABS: 1.0000 | ORAL_TABLET | Freq: Four times a day (QID) | ORAL | 0 refills | Status: DC | PRN

## 2023-06-24 NOTE — Telephone Encounter (Signed)
Noted.   Josceline Chenard, MD  Family Medicine Teaching Service   

## 2023-06-24 NOTE — Addendum Note (Signed)
Addended by: Manson Passey, Alick Lecomte on: 06/24/2023 08:12 AM   Modules accepted: Orders

## 2023-06-24 NOTE — Telephone Encounter (Signed)
Called patient and informed her of RX at pharmacy.  Patient is not taking Lyrica and does not recall ever taking it.  Patient states that she was prescribed Lyrica when she went to Urgent Care but never picked it up.  Glennie Hawk, CMA

## 2023-06-24 NOTE — Telephone Encounter (Signed)
PDMP reviewed-very appropriate. No early fills.  Please let patient know I have sent in 10 extra tablets.  Please ask if she is taking Lyrica prescribed at end of October and if it helped with symptoms.  Terisa Starr, MD  Family Medicine Teaching Service

## 2023-06-24 NOTE — Telephone Encounter (Signed)
Patient returns call to nurse line.   She reports someone just called her in regards to Lyrica.   She reports she never picked this up from the pharmacy and does not plan to.   Will forward to [employer].

## 2023-06-25 ENCOUNTER — Telehealth: Payer: Self-pay

## 2023-06-25 MED ORDER — PREGABALIN 50 MG PO CAPS: 50.0000 mg | ORAL_CAPSULE | Freq: Two times a day (BID) | ORAL | 0 refills | Status: DC | PRN

## 2023-06-25 NOTE — Telephone Encounter (Signed)
Patients niece contacted and advised of medication to [facility] pharmacy.   She was appreciative.

## 2023-06-25 NOTE — Telephone Encounter (Signed)
Patients niece calls nurse line in regards to Lyrica.   She reports some confusion with this medication and the patient. She reports the prescription was not picked up on 10/30 and has been "cancelled" by the pharmacy. She reports the provider would need to resend this in due to being a controlled substance. However, the original prescriber was an urgent care.   She reports they would like to try this medication at this time due to ongoing pain.   Advised will forward to Dr. Marisue Humble to see if he would be willing to send in a short course for patient to try.   Advised she may need a FU apt for medication.   Will call Marcelino Duster back with update.

## 2023-06-29 ENCOUNTER — Other Ambulatory Visit: Payer: Self-pay

## 2023-06-29 DIAGNOSIS — F419 Anxiety disorder, unspecified: Secondary | ICD-10-CM

## 2023-06-29 MED ORDER — BUSPIRONE HCL 5 MG PO TABS: 5.0000 mg | ORAL_TABLET | Freq: Three times a day (TID) | ORAL | 3 refills | Status: DC

## 2023-06-30 ENCOUNTER — Other Ambulatory Visit: Payer: Self-pay

## 2023-06-30 ENCOUNTER — Telehealth: Payer: Self-pay

## 2023-06-30 DIAGNOSIS — I10 Essential (primary) hypertension: Secondary | ICD-10-CM

## 2023-06-30 NOTE — Telephone Encounter (Signed)
Patient calls nurse line requesting a home health aide, possibly PT as well.   Advised that insurance requires appointment to initiate process for home health. She is asking if this appointment can be virtual due to issues with pain.   Will forward to PCP for further advisement.   Veronda Prude, RN

## 2023-07-01 MED ORDER — ENALAPRIL MALEATE 10 MG PO TABS
10.0000 mg | ORAL_TABLET | Freq: Every day | ORAL | 3 refills | Status: DC
  Filled 2024-03-30: qty 90, 90d supply, fill #0

## 2023-07-02 ENCOUNTER — Ambulatory Visit: Payer: PPO | Admitting: Obstetrics and Gynecology

## 2023-07-02 NOTE — Telephone Encounter (Signed)
Called patient and scheduled for virtual appointment on 11/26 at 8:50.  Veronda Prude, RN

## 2023-07-02 NOTE — Progress Notes (Deleted)
Bulkamid Injection  CC: 82 y.o. y.o. F with stress incontinence who presents for transurethral Bulkamid injection.  Patient signed her consent form.  She started antibiotic prophylaxis today.  There were no vitals filed for this visit.  No results found. However, due to the size of the patient record, not all encounters were searched. Please check Results Review for a complete set of results.  Procedure: Time out was performed. The bladder was catheterized and 10 ml of 2% lidocaine jelly placed in the urethra. A urethral block was performed by injecting 3ml of 1% lidocaine with epinephrine at 3 and 9 o'clock adjacent to the urethra.  The needle was primed.  The cystoscope was inserted to the level of the bladder neck.  The needle was inserted 2 cm and the scope was pulled back into the urethra 2 cm.  The needle was inserted bevel up at the 5 o'clock position and the Bulkamid was injected to obtain coaptation.  This was repeated at the 2 o'clock,  10 o'clock and 7 o'clock positions.   A total of 2- 1ml syringes were used and good circumferential coaptation was noted.  The patient tolerated the procedure well. She was asked to void after the procedure.  Post-Void Residual (PVR) by Bladder Scan: In order to evaluate bladder emptying, we discussed obtaining a postvoid residual and she agreed to this procedure.  Procedure: The ultrasound unit was placed on the patient's abdomen in the suprapubic region after the patient had voided. A PVR of *** ml was obtained by bladder scan.     ASSESSMENT: 82 y.o. y.o. s/p transurethral Bulkamid injection for stress incontinence.   PLAN: Patient will follow up in 4 weeks to reassess. Voiding and post-procedure precautions were given. She will return for heavy bleeding, fevers, dysuria lasting beyond today and incomplete emptying.  All questions were answered.  Marguerita Beards, MD

## 2023-07-07 ENCOUNTER — Telehealth (INDEPENDENT_AMBULATORY_CARE_PROVIDER_SITE_OTHER): Payer: PPO | Admitting: Family Medicine

## 2023-07-07 DIAGNOSIS — F419 Anxiety disorder, unspecified: Secondary | ICD-10-CM

## 2023-07-07 DIAGNOSIS — M48062 Spinal stenosis, lumbar region with neurogenic claudication: Secondary | ICD-10-CM

## 2023-07-07 DIAGNOSIS — M159 Polyosteoarthritis, unspecified: Secondary | ICD-10-CM

## 2023-07-07 DIAGNOSIS — M546 Pain in thoracic spine: Secondary | ICD-10-CM

## 2023-07-07 DIAGNOSIS — G8929 Other chronic pain: Secondary | ICD-10-CM

## 2023-07-07 DIAGNOSIS — F4323 Adjustment disorder with mixed anxiety and depressed mood: Secondary | ICD-10-CM

## 2023-07-07 DIAGNOSIS — E118 Type 2 diabetes mellitus with unspecified complications: Secondary | ICD-10-CM

## 2023-07-07 MED ORDER — BUSPIRONE HCL 10 MG PO TABS
10.0000 mg | ORAL_TABLET | Freq: Three times a day (TID) | ORAL | 1 refills | Status: AC
Start: 2023-07-07 — End: 2023-10-05

## 2023-07-07 MED ORDER — LIDOCAINE 5 % EX PTCH: 1.0000 | MEDICATED_PATCH | CUTANEOUS | 6 refills | Status: DC

## 2023-07-07 MED ORDER — PREGABALIN 50 MG PO CAPS: 50.0000 mg | ORAL_CAPSULE | Freq: Every evening | ORAL | Status: DC | PRN

## 2023-07-07 NOTE — Progress Notes (Unsigned)
Virtual Visit via Video Note  I connected with Berdena Miga on 07/07/23 at  8:50 AM EST by a video enabled telemedicine application and verified that I am speaking with the correct person using two identifiers.  Location: Patient: home Provider: [facility]   I discussed the limitations of evaluation and management by telemedicine and the availability of in person appointments. The patient expressed understanding and agreed to proceed.  History of Present Illness:  Thoracic back pain - longstanding issue. Notes from previous PCP as far back as 2022 note likely chronic pain syndrome akin to complex regional pain syndrome. On chronic narcotics. Per chart review, had some benefit with PT in the past. Previously seen by Neurosurgery, most recently 04/2023, received steroid injections, helped though this contributed to marked hyperglycemia.  - has upcoming appt with Ortho.  - pain gets so bad she has to lay on the floor and makes it difficult to perform her ADLs. States it is difficult to shower. She is not cooking as much and feels she has lost weight due to not eating as much. She doesn't have as much trouble dressing herself or toileting. - lyrica helping with pain but feels it is contributing to some confusion. Taking one tablet twice daily.   Anxiety - taking buspar 2 tablets, working better than one 5mg  tablet.   Observations/Objective:  Well appearing, in NAD. Speaks in full sentences. Comfortable WOB on RA. No resp distress.    Assessment and Plan:  Chronic right-sided thoracic back pain Continue to follow with Ortho, NSG. Discussed would not be beneficial in long term to increase narcotics especially with fall history. Given difficulty performing ADLs and mobility issues making transportation difficult, referred for HHPT, aide. Nursing to help with applying lidocaine patch and confusion regarding medications. Due for f/u in 2 months.  Adjustment reaction with anxiety and  depression Adjusted buspar dosing to reflex current dosing.      I discussed the assessment and treatment plan with the patient. The patient was provided an opportunity to ask questions and all were answered. The patient agreed with the plan and demonstrated an understanding of the instructions.   The patient was advised to call back or seek an in-person evaluation if the symptoms worsen or if the condition fails to improve as anticipated.  I provided 14 minutes of non-face-to-face time during this encounter.   Caro Laroche, DO

## 2023-07-08 ENCOUNTER — Encounter: Payer: Self-pay | Admitting: Family Medicine

## 2023-07-08 NOTE — Assessment & Plan Note (Signed)
Adjusted buspar dosing to reflex current dosing.

## 2023-07-08 NOTE — Assessment & Plan Note (Signed)
>>  ASSESSMENT AND PLAN FOR ADJUSTMENT REACTION WITH ANXIETY AND DEPRESSION WRITTEN ON 07/08/2023  9:08 AM BY Arun Herrod M, DO  Adjusted buspar  dosing to reflex current dosing.

## 2023-07-08 NOTE — Assessment & Plan Note (Addendum)
Continue to follow with Ortho, NSG. Discussed would not be beneficial in long term to increase narcotics especially with fall history. Given difficulty performing ADLs and mobility issues making transportation difficult, referred for HHPT, aide. Nursing to help with applying lidocaine patch and confusion regarding medications. Due for f/u in 2 months.

## 2023-07-13 ENCOUNTER — Telehealth: Payer: Self-pay

## 2023-07-13 ENCOUNTER — Other Ambulatory Visit (INDEPENDENT_AMBULATORY_CARE_PROVIDER_SITE_OTHER): Payer: Self-pay

## 2023-07-13 ENCOUNTER — Ambulatory Visit: Payer: PPO | Admitting: Physician Assistant

## 2023-07-13 ENCOUNTER — Other Ambulatory Visit: Payer: Self-pay

## 2023-07-13 ENCOUNTER — Telehealth: Payer: Self-pay | Admitting: Radiology

## 2023-07-13 ENCOUNTER — Encounter: Payer: Self-pay | Admitting: Physician Assistant

## 2023-07-13 DIAGNOSIS — G8929 Other chronic pain: Secondary | ICD-10-CM

## 2023-07-13 DIAGNOSIS — M25511 Pain in right shoulder: Secondary | ICD-10-CM

## 2023-07-13 DIAGNOSIS — M159 Polyosteoarthritis, unspecified: Secondary | ICD-10-CM

## 2023-07-13 DIAGNOSIS — M19011 Primary osteoarthritis, right shoulder: Secondary | ICD-10-CM | POA: Diagnosis not present

## 2023-07-13 MED ORDER — OXYCODONE-ACETAMINOPHEN 5-325 MG PO TABS: 0.5000 | ORAL_TABLET | Freq: Four times a day (QID) | ORAL | 0 refills | Status: DC | PRN

## 2023-07-13 NOTE — Telephone Encounter (Signed)
Transition Care Management Follow-up Telephone Call Date of discharge and from where: 06/23/2023 [facility] How have you been since you were released from the hospital? Patient stated her pain level has not improved. Any questions or concerns? No  Items Reviewed: Did the pt receive and understand the discharge instructions provided? Yes  Medications obtained and verified? Yes  Other? No  Any new allergies since your discharge? No  Dietary orders reviewed? Yes Do you have support at home? Yes   Follow up appointments reviewed:  PCP Hospital f/u appt confirmed? Yes  Scheduled to see Darl Householder. Rumball, DO on 07/07/2023 @ [facility]. Specialist Hospital f/u appt confirmed? Yes  Scheduled to see Stefani Dama, MD on 06/24/2023 @ [facility]. Are transportation arrangements needed?  Per patient request mailing SCAT application to patient's home. If their condition worsens, is the pt aware to call PCP or go to the Emergency Dept.? Yes Was the patient provided with contact information for the PCP's office or ED? Yes Was to pt encouraged to call back with questions or concerns? Yes   Peightyn Roberson [facility]  [facility], [facility] Guide Direct Dial: [phone]  Website: [email or web address]

## 2023-07-13 NOTE — Progress Notes (Signed)
Office Visit Note   Patient: [name]           Date of Birth: 07/03/1941           MRN: [id number] Visit Date: 07/13/2023              Requested by: Caro Laroche, DO [address] [place],  [place] [address] PCP: Caro Laroche, DO   Assessment & Plan: Visit Diagnoses:  1. Chronic right shoulder pain   2. Primary osteoarthritis, right shoulder     Plan: We will send her for an ultrasound-guided right shoulder intra-articular injection and possible aspiration by Dr. Shon Baton.  She will check with her primary care doctor to discuss how she can cover her glucose levels which do go up after cortisone injections.  2 weeks after the cortisone injection she will see Dr. August Saucer to discuss what type of relief she had.  Questions were encouraged and answered at length.  Follow-Up Instructions: Return for Dr. August Saucer 2 weeks after shoulder injection.   Orders:  Orders Placed This Encounter  Procedures   XR Shoulder Right   No orders of the defined types were placed in this encounter.     Procedures: No procedures performed   Clinical Data: No additional findings.   Subjective: Chief Complaint  Patient presents with   Right Shoulder - Pain    HPI [name] comes in today due to right shoulder pain.  She states that she fell several years ago onto the shoulder.  She has seen Dr. August Saucer in the past and has been diagnosed with right shoulder arthritis with shoulder arthropathy.  She was to have an injection after she got her glucose levels under control intra-articular right shoulder.  However this never occurred.  She is now having severe pain in her shoulder.  Most of her pain is in the scapular area.  She has been assessed by Dr. Danielle Dess and he feels that her scapular pain is due to her shoulder.    Review of Systems See HPI otherwise negative  Objective: Vital Signs: There were no vitals taken for this visit.  Physical Exam Constitutional:      Appearance: She  is not ill-appearing or diaphoretic.  Pulmonary:     Effort: Pulmonary effort is normal.  Neurological:     Mental Status: She is alert and oriented to person, place, and time.  Psychiatric:        Mood and Affect: Mood normal.     Ortho Exam Bilateral shoulders she has good range of motion of the left shoulder without pain.  Right shoulder limited forward flexion.  She has weakness with external rotation of the right shoulder against resistance.  Indican test negative bilaterally.  Crepitus with internal/external rotation right shoulder abducted at 90 degrees.  Specialty Comments:  No specialty comments available.  Imaging: XR Shoulder Right  Result Date: 07/13/2023 Right shoulder 2 views: No acute fractures.  Shoulder is located.  End-stage glenohumeral joint arthritis.  Mild AC joint changes.    PMFS History: Patient Active Problem List   Diagnosis Date Noted   Pulmonary nodule 06/11/2023   Pyelonephritis 06/11/2023   Acute right-sided thoracic back pain 05/25/2023   AAA (abdominal aortic aneurysm) (HCC) 05/23/2023   Head trauma 04/08/2023   Contusion of left knee 04/08/2023   Rhinorrhea 04/08/2023   Pain in left knee 04/03/2023   Right leg swelling 10/19/2022   Pruritus 08/18/2022   Other fatigue 12/06/2021   Chronic pain  syndrome    Rotator cuff syndrome of right shoulder 01/21/2019   Bladder prolapse, female, acquired 08/16/2018   Urinary incontinence 07/05/2018   Adjustment reaction with anxiety and depression 03/26/2017   Iron deficiency anemia due to chronic blood loss 10/31/2015   Chronic right-sided thoracic back pain 07/26/2015   Spinal stenosis of lumbar region 07/10/2015   Abdominal aortic atherosclerosis (HCC) 07/10/2015   Sleep apnea 05/22/2010   Hereditary and idiopathic peripheral neuropathy 02/06/2010   Irritable bowel syndrome 11/12/2007   Diabetes mellitus type 2, controlled, with complications (HCC) 10/08/2006   HYPERCHOLESTEROLEMIA 10/08/2006    HYPERTRIGLYCERIDEMIA 10/08/2006   Migraine headache 10/08/2006   Hypertension 10/08/2006   Asthma 10/08/2006   Reflux esophagitis 10/08/2006   Peptic ulcer 10/08/2006   Eczema 10/08/2006   Psoriasis 10/08/2006   Osteoarthritis, multiple sites 10/08/2006   VERTIGO NOS OR DIZZINESS 10/08/2006   Past Medical History:  Diagnosis Date   Asthma    Depression    Diabetes mellitus without complication (HCC)    History of surgery on arm    right arm ( plate and screws)   Hypertension    Macular degeneration    Migraine    without aura   Pneumonia    Urinary incontinence     Family History  Problem Relation Age of Onset   Hypertension Mother    Hypertension Father    Hearing loss Sister    Heart attack Sister 5       s/p 4 bypasses    Stroke Brother        61   Breast cancer Maternal Aunt    Ovarian cancer Maternal Aunt     Past Surgical History:  Procedure Laterality Date   ABDOMINAL HYSTERECTOMY     ?Lt. ovary remains   APPENDECTOMY     Arm surgery     BREAST BIOPSY Right 10/10/2009   BREAST BIOPSY Right 09/17/2022   MM RT BREAST BX W LOC DEV 1ST LESION IMAGE BX SPEC STEREO GUIDE 09/17/2022 [facility]   BREAST CYST ASPIRATION  12/20/2013   BREAST EXCISIONAL BIOPSY Left 1998   BREAST EXCISIONAL BIOPSY Right 1993   ESOPHAGOGASTRODUODENOSCOPY N/A 04/17/2021   Procedure: ESOPHAGOGASTRODUODENOSCOPY (EGD);  Surgeon: Charna Elizabeth, MD;  Location: [facility] ENDOSCOPY;  Service: Endoscopy;  Laterality: N/A;  Rectal bleeding and anemia.   ESOPHAGOGASTRODUODENOSCOPY N/A 04/18/2021   Procedure: ESOPHAGOGASTRODUODENOSCOPY (EGD);  Surgeon: Jeani Hawking, MD;  Location: [facility] ENDOSCOPY;  Service: Endoscopy;  Laterality: N/A;   HOT HEMOSTASIS N/A 04/18/2021   Procedure: HOT HEMOSTASIS (ARGON PLASMA COAGULATION/BICAP);  Surgeon: Jeani Hawking, MD;  Location: [facility] ENDOSCOPY;  Service: Endoscopy;  Laterality: N/A;   IR ANGIOGRAM SELECTIVE EACH ADDITIONAL VESSEL  04/17/2021   IR ANGIOGRAM VISCERAL  SELECTIVE  04/17/2021   IR EMBO ART  VEN HEMORR LYMPH EXTRAV  INC GUIDE ROADMAPPING  04/17/2021   IR US GUIDE VASC ACCESS RIGHT  04/17/2021   RADIOLOGY WITH ANESTHESIA N/A 04/17/2021   Procedure: RADIOLOGY WITH ANESTHESIA;  Surgeon: Radiologist, Medication, MD;  Location: [facility] OR;  Service: Radiology;  Laterality: N/A;   SUBMUCOSAL INJECTION  04/18/2021   Procedure: SUBMUCOSAL INJECTION;  Surgeon: Jeani Hawking, MD;  Location: [facility] ENDOSCOPY;  Service: Endoscopy;;   Social History   Occupational History   Occupation: Retired  Tobacco Use   Smoking status: Former    Current packs/day: 0.00    Average packs/day: 2.0 packs/day for 33.6 years (67.3 ttl pk-yrs)    Types: Cigarettes    Start date: 08/11/1957  Quit date: 04/04/1991    Years since quitting: 32.2   Smokeless tobacco: Never  Vaping Use   Vaping status: Never Used  Substance and Sexual Activity   Alcohol use: No   Drug use: No   Sexual activity: Not Currently

## 2023-07-13 NOTE — Telephone Encounter (Signed)
Left knee gel injection ?

## 2023-07-13 NOTE — Telephone Encounter (Signed)
Patient calls nurse line in regards to sugar levels.   She reports she had an apt today to have a shoulder injected, however they did not proceed with injection. She reports she mentioned to them how her blood sugars "go way up" post injections.   Since she has DM her Ortho would prefer for her to have a plan in place for "higher sugars." She reports they had mentioned insulin.   She reports she is waiting to reschedule until she hears from PCP.   Advised will forward to PCP.

## 2023-07-14 ENCOUNTER — Other Ambulatory Visit: Payer: Self-pay | Admitting: Radiology

## 2023-07-14 DIAGNOSIS — G8929 Other chronic pain: Secondary | ICD-10-CM

## 2023-07-14 MED ORDER — INSULIN GLARGINE 100 UNIT/ML SOLOSTAR PEN: 10.0000 [IU] | PEN_INJECTOR | SUBCUTANEOUS | 0 refills | Status: DC

## 2023-07-14 NOTE — Telephone Encounter (Signed)
Sent lantus to the pharmacy. She should inject 10 units daily AFTER getting her steroid injection (NOT before!!) and check her sugars daily and record a log. She should make a follow up appointment here for one week after her injection and bring her log with her.

## 2023-07-14 NOTE — Telephone Encounter (Signed)
Spoke to patient and her steroid injection is scheduled for 07/27/2023.  Patient states that she would like for PCP to be specific in instructions for the insulin as to how much and duration. As well as the amount.   Explained to patient that all of that information would be included with the prescription.  Patient seemed pleased with response.  Glennie Hawk, CMA

## 2023-07-17 DIAGNOSIS — M25511 Pain in right shoulder: Secondary | ICD-10-CM | POA: Diagnosis not present

## 2023-07-17 DIAGNOSIS — Z1331 Encounter for screening for depression: Secondary | ICD-10-CM | POA: Diagnosis not present

## 2023-07-20 ENCOUNTER — Ambulatory Visit: Payer: PPO | Admitting: Orthopaedic Surgery

## 2023-07-21 NOTE — Telephone Encounter (Signed)
Patient returns call to nurse line regarding questions with Lantus.   Advised of message per Dr. Linwood Dibbles.   Scheduled patient for PCP follow up on 08/03/23.  All questions answered.   Veronda Prude, RN

## 2023-07-22 ENCOUNTER — Telehealth: Payer: Self-pay

## 2023-07-22 NOTE — Telephone Encounter (Signed)
 Copied from CRM [id number]. Topic: General - Other >> Jul 22, 2023 12:01 PM Turkey B wrote: Reason for CRM: christy from [facility] says received referral for pt and skilled nursing, they can do the PT but can't do the skill nursing because they aren't equipped to do the lidocaine patch, so just needs verbal approval for the PT. Needs approval within 48 hrs

## 2023-07-22 NOTE — Telephone Encounter (Signed)
Patient calls nurse line requesting refill on lidocaine 5% ointment.   She reports that the ointment is easier for her to apply than the patches.   Will forward request to PCP.   Veronda Prude, RN

## 2023-07-23 MED ORDER — LIDOCAINE 5 % EX OINT: TOPICAL_OINTMENT | CUTANEOUS | 2 refills | Status: DC

## 2023-07-23 MED ORDER — LIDOCAINE 5 % EX OINT: 1.0000 | TOPICAL_OINTMENT | CUTANEOUS | 2 refills | Status: DC | PRN

## 2023-07-23 NOTE — Telephone Encounter (Signed)
VOB submitted for Monovisc, left knee 

## 2023-07-23 NOTE — Telephone Encounter (Signed)
[name] returns call to nurse line.   VO given.

## 2023-07-23 NOTE — Telephone Encounter (Signed)
Received call from pharmacy regarding prescription for lidocaine ointment.   Pharmacist reports that directions need to indicate the amount that is being applied and the frequency in order to be able to bill the insurance.   Please place new order with this information.   Veronda Prude, RN

## 2023-07-23 NOTE — Telephone Encounter (Signed)
Patient returns call to nurse line x2 regarding request.   Please advise.   Veronda Prude, RN

## 2023-07-23 NOTE — Telephone Encounter (Signed)
Done

## 2023-07-23 NOTE — Telephone Encounter (Signed)
Called Christy from [facility] to relay message that Dr. Linwood Dibbles stated. Left message for a call back.  Dr. Linwood Dibbles approved verbal orders for PT.  Drusilla Kanner, CMA

## 2023-07-23 NOTE — Addendum Note (Signed)
Addended by: Caro Laroche on: 07/23/2023 10:05 PM   Modules accepted: Orders

## 2023-07-24 ENCOUNTER — Telehealth: Payer: Self-pay

## 2023-07-24 DIAGNOSIS — F4323 Adjustment disorder with mixed anxiety and depressed mood: Secondary | ICD-10-CM | POA: Diagnosis not present

## 2023-07-24 DIAGNOSIS — Z7985 Long-term (current) use of injectable non-insulin antidiabetic drugs: Secondary | ICD-10-CM | POA: Diagnosis not present

## 2023-07-24 DIAGNOSIS — G894 Chronic pain syndrome: Secondary | ICD-10-CM | POA: Diagnosis not present

## 2023-07-24 DIAGNOSIS — M48062 Spinal stenosis, lumbar region with neurogenic claudication: Secondary | ICD-10-CM | POA: Diagnosis not present

## 2023-07-24 DIAGNOSIS — Z79891 Long term (current) use of opiate analgesic: Secondary | ICD-10-CM | POA: Diagnosis not present

## 2023-07-24 DIAGNOSIS — E118 Type 2 diabetes mellitus with unspecified complications: Secondary | ICD-10-CM | POA: Diagnosis not present

## 2023-07-24 DIAGNOSIS — M546 Pain in thoracic spine: Secondary | ICD-10-CM | POA: Diagnosis not present

## 2023-07-24 DIAGNOSIS — M159 Polyosteoarthritis, unspecified: Secondary | ICD-10-CM | POA: Diagnosis not present

## 2023-07-24 NOTE — Telephone Encounter (Signed)
Tyler Aas PT with [facility] calls nurse line requesting verbal orders for [facility] PT as follows.   2x a week for 1 week 1x a week for 2 weeks  2x a week for 2 weeks  1x a week for 1 week  Verbal [facility] for [facility] evaluation.   All verbals given.

## 2023-07-27 ENCOUNTER — Encounter: Payer: Self-pay | Admitting: Sports Medicine

## 2023-07-27 ENCOUNTER — Other Ambulatory Visit: Payer: Self-pay

## 2023-07-27 ENCOUNTER — Ambulatory Visit: Payer: PPO | Admitting: Sports Medicine

## 2023-07-27 DIAGNOSIS — G8929 Other chronic pain: Secondary | ICD-10-CM | POA: Diagnosis not present

## 2023-07-27 DIAGNOSIS — M25411 Effusion, right shoulder: Secondary | ICD-10-CM

## 2023-07-27 DIAGNOSIS — M19011 Primary osteoarthritis, right shoulder: Secondary | ICD-10-CM

## 2023-07-27 DIAGNOSIS — M25511 Pain in right shoulder: Secondary | ICD-10-CM | POA: Diagnosis not present

## 2023-07-27 MED ORDER — BUPIVACAINE HCL 0.25 % IJ SOLN
1.0000 mL | INTRAMUSCULAR | Status: AC | PRN
  Administered 2023-07-27: 1 mL via INTRA_ARTICULAR

## 2023-07-27 MED ORDER — METHYLPREDNISOLONE ACETATE 40 MG/ML IJ SUSP
40.0000 mg | INTRAMUSCULAR | Status: AC | PRN
  Administered 2023-07-27: 40 mg via INTRA_ARTICULAR

## 2023-07-27 MED ORDER — LIDOCAINE HCL 1 % IJ SOLN
4.0000 mL | INTRAMUSCULAR | Status: AC | PRN
  Administered 2023-07-27: 4 mL

## 2023-07-27 NOTE — Progress Notes (Signed)
Office & Procedure Note  Patient: Mikale Shareef             Date of Birth: 1940/08/19           MRN: [id number]             Visit Date: 07/27/2023  HPI: Mhia is a very pleasant 82 year old female who presents with acute on chronic right shoulder pain with shoulder swelling.  She had a fall years ago which is what started her shoulder pain.  She does have significant right shoulder arthritis.  She does note some swelling on the shoulder.  Her pain is more so in the scapular area but some of the pressure of the swollen shoulder will radiate into her clavicle.  No chest pain however.  She is a type-II diabetic. Lab Results  Component Value Date   HGBA1C 9.8 (A) 05/22/2023   PE: - Right shoulder: There is a large effusion without warmth or redness from the anterior lateral aspect of the shoulder joint that is ballotable.  Range of motion is restricted.  There is an aching patch over the scapular border with associated trigger point near.   Imaging: XR Shoulder Right Right shoulder 2 views: No acute fractures.  Shoulder is located.   End-stage glenohumeral joint arthritis.  Mild AC joint changes.  Visit Diagnoses:  1. Chronic right shoulder pain   2. Primary osteoarthritis, right shoulder   3. Effusion of joint of right shoulder       Procedures:  Large Joint Inj: R subacromial bursa on 07/27/2023 2:48 PM Indications: pain and joint swelling Details: 18 G 1.5 in needle, ultrasound-guided anterolateral approach Medications: 4 mL lidocaine 1 %; 40 mg methylPREDNISolone acetate 40 MG/ML; 1 mL bupivacaine 0.25 % Aspirate: 34 mL yellow and clear Outcome: tolerated well, no immediate complications  *Technically successful ultrasound-guided right shoulder, subacromial joint-synonymous with GHJ aspiration and subsequent injection.  *Aspirated 34 cc of clear yellow fluid.  1 cc of bupivacaine and 1 cc of methylprednisolone and injected using sterile syringe well via same  portal. Procedure, treatment alternatives, risks and benefits explained, specific risks discussed. Consent was given by the patient. Immediately prior to procedure a time out was called to verify the correct patient, procedure, equipment, support staff and site/side marked as required. Patient was prepped and draped in the usual sterile fashion.     Plan: -Reviewed x-ray and exam for [place] and she has severe glenohumeral joint arthritis but has more of a anterior subacromial joint that is synonymous with the anterior glenohumeral joint with a large effusion present.  Through shared decision making, proceed with ultrasound-guided aspiration and subsequent injection. -She has a lot of pain over the scapular border and this made the procedure difficult due to tolerance and movement but was successful.  I would like to see how she does with this and we will reevaluate in about 2 weeks.  Could consider atrial posterior glenohumeral joint injection once and then get her to Dr. August Saucer to evaluate this going forward and discuss any possible surgical intervention. -She will monitor her sugars for any transient increase in the interim. -Okay for lidocaine, topical medications and Tylenol as needed for pain control - follow-up with me in 2-weeks for reevaluation and consideration of ultrasound-guided glenohumeral joint injection  Madelyn Brunner, DO Primary Care Sports Medicine Physician  [facility] - Orthopedics  This note was dictated using Dragon naturally speaking software and may contain errors in syntax, spelling, or content which have  not been identified prior to signing this note.     Madelyn Brunner, DO Primary Care Sports Medicine Physician  [facility] - Orthopedics  This note was dictated using Dragon naturally speaking software and may contain errors in syntax, spelling, or content which have not been identified prior to signing this note.

## 2023-07-28 ENCOUNTER — Telehealth: Payer: Self-pay

## 2023-07-28 NOTE — Telephone Encounter (Signed)
Patient LVM on nurse line in regards to insulin and steroid injection.   She reports she had her apt yesterday, however they did not end up doing the steroid injection. See 12/16 OV note from Ortho.   She is unsure if she should take the insulin now.   Will forward to PCP for advisement.

## 2023-07-30 ENCOUNTER — Ambulatory Visit (INDEPENDENT_AMBULATORY_CARE_PROVIDER_SITE_OTHER): Payer: PPO | Admitting: Otolaryngology

## 2023-07-31 ENCOUNTER — Telehealth: Payer: Self-pay

## 2023-07-31 ENCOUNTER — Telehealth: Payer: Self-pay | Admitting: Physician Assistant

## 2023-07-31 NOTE — Telephone Encounter (Signed)
Pt called requesting a call back concerning right shoulder pains. Pt came in last week fro fluid to be drained. Please call pt at [phone].

## 2023-07-31 NOTE — Telephone Encounter (Signed)
Called patient and Dr. Shon Baton also spoke to her. Advised that it does not sound like a reaction to the procedure, and that she has follow up with Rexene Edison and Dr. Shon Baton in the coming weeks to address pain and also do glenohumeral joint injection. Patient will follow up with PCP about changes to dose of pain medication.

## 2023-07-31 NOTE — Telephone Encounter (Signed)
Patient calls nurse line due to issues with right shoulder pain, radiating into back and under shoulder blade.   She called orthopedic office this morning. They recommended that she contact PCP to see if pain medication could be increased or an alternative medication to help manage her pain.   Yesterday she was in so much pain that she was staying on the floor, unable to sleep due to pain.   She reports that she is taking a full pill of the percocet every six hours, however, does not feel like it is doing much in management of her pain.   Advised patient that I would send message to PCP, however, I would recommend being evaluated in UC over the weekend if pain were to become unbearable.   Patient also wanted me to ask PCP about her Ozepmic. She is wondering if 1 mg dosing may be too strong and if this is causing her blood sugar levels to drop.   Will forward to PCP for further advisement.   Veronda Prude, RN

## 2023-07-31 NOTE — Telephone Encounter (Signed)
Called patient.   She is adamant they did not do a steroid injection.   She asks we clarify with her Ortho provider.   She reports good sugar control.   Will forward to PCP.

## 2023-07-31 NOTE — Telephone Encounter (Signed)
Called patient.   She reports she will monitor her cbgs between now and apt with PCP on 08/21/2023.  She reports continued glucose control.   She reports she has had some occasional lows ~50s. She reports she has some orange juice on hand if needed.   Precautions discussed with patient.   She will FU as scheduled on 01/10.

## 2023-08-03 ENCOUNTER — Ambulatory Visit: Payer: PPO | Admitting: Family Medicine

## 2023-08-03 NOTE — Telephone Encounter (Signed)
I do not recommend increasing her pain medication dosing. This would increase her risk for falls.  She should continue to follow with her orthopedic doctor regarding potential steroid injections or other modalities.  Ozempic could be contributing to lower blood sugars however she has had good control on this in the past. We will plan to review her sugars in more detail when she comes for her appointment in a couple weeks. If she is noticing lower blood sugars consistently (less than 80) she should come for an appointment before then.

## 2023-08-03 NOTE — Telephone Encounter (Signed)
LVM for patient with above message from Dr. Linwood Dibbles.  Glennie Hawk, CMA

## 2023-08-03 NOTE — Telephone Encounter (Signed)
Patient returns call to nurse line.  Patient advised of PCP recommendations.   Patient advised to keep apt with PCP for 01/10.  Patient advised to schedule a sooner apt if sugars are consistently below 80.  Patient agreed with plan.

## 2023-08-07 ENCOUNTER — Telehealth: Payer: Self-pay

## 2023-08-07 DIAGNOSIS — G8929 Other chronic pain: Secondary | ICD-10-CM

## 2023-08-07 DIAGNOSIS — M159 Polyosteoarthritis, unspecified: Secondary | ICD-10-CM

## 2023-08-07 MED ORDER — OXYCODONE-ACETAMINOPHEN 5-325 MG PO TABS: 0.5000 | ORAL_TABLET | Freq: Four times a day (QID) | ORAL | 0 refills | Status: DC | PRN

## 2023-08-07 NOTE — Telephone Encounter (Signed)
Patient calls nurse line requesting a refill on Oxycodone.   She reports she has been taking one full tab vs 1/2 tab. She reports PCP "should" be aware of this.   She is requesting this script to be written for (1) tablet.   Advised will forward to PCP.

## 2023-08-07 NOTE — Telephone Encounter (Signed)
Will send short course of oxycodone to last until next appt with me. Should aim to take 0.5 tablets and only take additional half tablets if pain uncontrolled after about an hour. Ultimately, will not pursue increased dosages long term given risk due to h/o multiple falls. She should make sure she is taking her cymbalta and lyrica as these will likely have better benefit and less risk than increasing her oxycodone. We will talk about potential options going forward at her f/u appt.

## 2023-08-10 ENCOUNTER — Other Ambulatory Visit: Payer: Self-pay | Admitting: Family Medicine

## 2023-08-10 ENCOUNTER — Encounter: Payer: Self-pay | Admitting: Physician Assistant

## 2023-08-10 ENCOUNTER — Ambulatory Visit: Payer: PPO | Admitting: Physician Assistant

## 2023-08-10 DIAGNOSIS — M159 Polyosteoarthritis, unspecified: Secondary | ICD-10-CM

## 2023-08-10 DIAGNOSIS — M1712 Unilateral primary osteoarthritis, left knee: Secondary | ICD-10-CM

## 2023-08-10 DIAGNOSIS — G8929 Other chronic pain: Secondary | ICD-10-CM

## 2023-08-10 MED ORDER — HYALURONAN 88 MG/4ML IX SOSY
88.0000 mg | PREFILLED_SYRINGE | INTRA_ARTICULAR | Status: AC | PRN
  Administered 2023-08-10: 88 mg via INTRA_ARTICULAR

## 2023-08-10 MED ORDER — LIDOCAINE HCL 1 % IJ SOLN
4.0000 mL | INTRAMUSCULAR | Status: AC | PRN
  Administered 2023-08-10: 4 mL

## 2023-08-10 NOTE — Progress Notes (Signed)
   Procedure Note  Patient: [name]             Date of Birth: [birth year]           MRN: [id number]             Visit Date: 08/10/2023 HPI [name] comes in today for scheduled left knee Monovisc injection.  She has known osteoarthritis of her left knee.  She has had prior viscosupplementation injections in the past.  Has known left knee osteoarthritis.  No surgery scheduled for the next 6 months left knee.  Physical exam: General Well-developed well-nourished female walks with antalgic gait and the use of a rollator. Left knee: Good range of motion.  No abnormal warmth erythema.  Positive effusion.  Procedures: Visit Diagnoses:  1. Primary osteoarthritis of left knee     Large Joint Inj: L knee on 08/10/2023 4:59 PM Indications: pain Details: 22 G 1.5 in needle, superolateral approach  Arthrogram: No  Medications: 88 mg Hyaluronan 88 MG/4ML; 4 mL lidocaine 1 % Aspirate: 15 mL yellow Outcome: tolerated well, no immediate complications Procedure, treatment alternatives, risks and benefits explained, specific risks discussed. Consent was given by the patient. Immediately prior to procedure a time out was called to verify the correct patient, procedure, equipment, support staff and site/side marked as required. Patient was prepped and draped in the usual sterile fashion.     Plan: Will see her back in just 8 weeks to see what type of response she had in regards to the left knee as she was having some symptoms of pain radiating up and down the leg.  Questions were encouraged and answered at length.  Ace bandage was applied to the knee she will remove this prior to retiring to bed this evening.

## 2023-08-11 ENCOUNTER — Telehealth: Payer: Self-pay

## 2023-08-11 NOTE — Telephone Encounter (Signed)
Patient called stating she has pain in her entire leg thigh to ankle She had gel injection yesterday She can not take anti-inflammatories

## 2023-08-13 ENCOUNTER — Ambulatory Visit (INDEPENDENT_AMBULATORY_CARE_PROVIDER_SITE_OTHER): Payer: PPO | Admitting: Otolaryngology

## 2023-08-13 ENCOUNTER — Telehealth: Payer: Self-pay

## 2023-08-13 DIAGNOSIS — M159 Polyosteoarthritis, unspecified: Secondary | ICD-10-CM

## 2023-08-13 DIAGNOSIS — G8929 Other chronic pain: Secondary | ICD-10-CM

## 2023-08-13 NOTE — Telephone Encounter (Signed)
 Patient calls nurse line requesting refill on oxycodone prescription.   She picked up 10 tablets on 08/08/23. She reports having 1/2 tablet left.   Requesting refill of 60 quantity for one month supply.   Please advise.   Veronda Prude, RN

## 2023-08-13 NOTE — Telephone Encounter (Signed)
 Patient calls nurse line again in regards to Oxycodone refill.   She reports she is completely out.   Last fill of monthly Oxycodone was 12/2.  Will forward to PCP.

## 2023-08-14 ENCOUNTER — Encounter: Payer: Self-pay | Admitting: Sports Medicine

## 2023-08-14 ENCOUNTER — Ambulatory Visit: Payer: PPO | Admitting: Sports Medicine

## 2023-08-14 ENCOUNTER — Other Ambulatory Visit: Payer: Self-pay

## 2023-08-14 DIAGNOSIS — M25511 Pain in right shoulder: Secondary | ICD-10-CM | POA: Diagnosis not present

## 2023-08-14 DIAGNOSIS — M19011 Primary osteoarthritis, right shoulder: Secondary | ICD-10-CM

## 2023-08-14 DIAGNOSIS — I10 Essential (primary) hypertension: Secondary | ICD-10-CM

## 2023-08-14 DIAGNOSIS — M25562 Pain in left knee: Secondary | ICD-10-CM

## 2023-08-14 DIAGNOSIS — G43909 Migraine, unspecified, not intractable, without status migrainosus: Secondary | ICD-10-CM

## 2023-08-14 DIAGNOSIS — G8929 Other chronic pain: Secondary | ICD-10-CM

## 2023-08-14 DIAGNOSIS — M899 Disorder of bone, unspecified: Secondary | ICD-10-CM | POA: Diagnosis not present

## 2023-08-14 MED ORDER — METHYLPREDNISOLONE ACETATE 40 MG/ML IJ SUSP
40.0000 mg | INTRAMUSCULAR | Status: AC | PRN
  Administered 2023-08-14: 40 mg via INTRA_ARTICULAR

## 2023-08-14 MED ORDER — BUPIVACAINE HCL 0.25 % IJ SOLN
2.0000 mL | INTRAMUSCULAR | Status: AC | PRN
  Administered 2023-08-14: 2 mL via INTRA_ARTICULAR

## 2023-08-14 MED ORDER — OXYCODONE-ACETAMINOPHEN 5-325 MG PO TABS: 0.5000 | ORAL_TABLET | Freq: Four times a day (QID) | ORAL | 0 refills | Status: DC | PRN

## 2023-08-14 MED ORDER — LIDOCAINE HCL 1 % IJ SOLN
2.0000 mL | INTRAMUSCULAR | Status: AC | PRN
  Administered 2023-08-14: 2 mL

## 2023-08-14 NOTE — Telephone Encounter (Signed)
 Patient returns call to nurse line regarding pain medication.  Advised that provider sent enough medication to last until appointment with PCP on 08/21/23. Patient asked why she was only receiving partial refill. Advised of message per Dr. Donah. Patient reports that she is taking Cymbalta  and Lyrica . Patient to follow up with PCP next week.   Debra JAYSON English, RN

## 2023-08-14 NOTE — Progress Notes (Signed)
 [name] - 83 y.o. female MRN [id number]  Date of birth: [birth year]  Office Visit Note: Visit Date: 08/14/2023 PCP: Madelon Donald HERO, DO Referred by: Madelon Donald HERO, DO  Subjective: Chief Complaint  Patient presents with   Right Shoulder - Pain   HPI: [name] is a pleasant 83 y.o. female who presents today for follow-up of chronic right shoulder pain with known advanced arthritis.  Also having some soreness in giveaway of the left knee after gel injection 1 week ago.  Right shoulder -she had a large effusion in the continue with subacromial GHJ which 2 weeks ago we pulled off about 35 cc of fluid.  She had more range of motion from this.  Following this aspiration and injection she did have 2 full days of complete pain relief.  She had no pain in the shoulder or about her posterior scapula.  Slowly following this her pain started to return.  She continues with more of her pain over the posterior area pointing near the scapula.  Left knee pain -this began after she underwent gel injection into the left knee on 08/10/2023 by Tory Gaskins.  She has been using Tylenol  as well as ice/heat.  He has been more helpful. Making walking difficult.  She is taking 0.5 tablets of Percocet 5-325 mg every 6 hours as needed for pain.  This is prescribed through her primary physician, but she states today she is not comfortable with maintaining this long-term.  Pertinent ROS were reviewed with the patient and found to be negative unless otherwise specified above in HPI.   Assessment & Plan: Visit Diagnoses:  1. Primary osteoarthritis, right shoulder   2. Chronic right shoulder pain   3. Acute pain of left knee   4. Trigger point of shoulder region, right   5. Scapular dysfunction    Plan: Impression is chronic right shoulder pain with advanced osteoarthritis, she had excellent relief temporarily of her pain after subacromial joint aspiration of 35 cc.  More of the pain is over the posterior  shoulder/scapular region, although based on prior evaluation by Tory Gaskins thought this was more so emanating from the shoulder joint.  After the injection into the shoulder joint, she had absolutely no pain here or over the scapula for at least 2 days, which does point to this emanating more so from the shoulder joint itself.  She does have a degree of scapular dysfunction with associated muscular trigger point however that I think is also contributing.  Given this, we did print out a customized scapular retraction handout and did review these in the room with her.  She will start these once daily starting Monday morning.  She is on 0.5 tablets of Percocet 5-325 mg from her PCP, she may take this for breakthrough pain.  Okay to continue her Lyrica  50 mg at bedtime and her Cymbalta  30 mg daily for pain.  We did proceed with ultrasound-guided glenohumeral joint injection, patient tolerated well and did states she felt this injection went right into the area of her pain.  Advised on 48 hours of modified rest and activity, ice/heat as needed. She will follow-up with Dr. Addie in about 2 weeks for reevaluation of the right shoulder to discuss next steps per Tory Dillon original plan. I am happy to see her back as needed.  Follow-up: Return in about 2 weeks (around 08/28/2023) for make appt to see Dr. Addie for R-shoulder (per Tory Gaskins) in 2-3 weeks.   Meds &  Orders: No orders of the defined types were placed in this encounter.   Orders Placed This Encounter  Procedures   Large Joint Inj   US  Guided Needle Placement - No Linked Charges     Procedures: Large Joint Inj: R glenohumeral on 08/14/2023 1:39 PM Indications: pain Details: 22 G 3.5 in needle, ultrasound-guided posterior approach Medications: 2 mL lidocaine  1 %; 2 mL bupivacaine  0.25 %; 40 mg methylPREDNISolone  acetate 40 MG/ML Outcome: tolerated well, no immediate complications  US -guided glenohumeral joint injection, right shoulder After  discussion on risks/benefits/indications, informed verbal consent was obtained. A timeout was then performed. The patient was positioned lying lateral recumbent on examination table. The patient's shoulder was prepped with betadine and multiple alcohol swabs and utilizing ultrasound guidance, the patient's glenohumeral joint was identified on ultrasound. Using ultrasound guidance a 22-gauge, 3.5 inch needle with a mixture of 2:2:1 cc's lidocaine :bupivicaine:depomedrol was directed from a lateral to medial direction via in-plane technique into the glenohumeral joint with visualization of appropriate spread of injectate into the joint. Patient tolerated the procedure well without immediate complications.      Procedure, treatment alternatives, risks and benefits explained, specific risks discussed. Consent was given by the patient. Immediately prior to procedure a time out was called to verify the correct patient, procedure, equipment, support staff and site/side marked as required. Patient was prepped and draped in the usual sterile fashion.          Clinical History: No specialty comments available.  She reports that she quit smoking about 32 years ago. Her smoking use included cigarettes. She started smoking about 66 years ago. She has a 67.3 pack-year smoking history. She has never used smokeless tobacco.  Recent Labs    09/24/22 1004 05/22/23 1000  HGBA1C 6.1 9.8*    Objective:    Physical Exam  Gen: Well-appearing, in no acute distress; non-toxic CV: Well-perfused. Warm.  Resp: Breathing unlabored on room air; no wheezing. Psych: Fluid speech in conversation; appropriate affect; normal thought process  Ortho Exam - Right shoulder: There is only a trace effusion of the subacromial and glenohumeral joint without redness or effusion, markedly improved from last visit.  There is no AC joint TTP.  There is pain and associated trigger point over the anterior aspect of the right scapula.   There is a degree of scapular dysfunction as well.  There is some mild grating through end range of motion about the shoulder in all directions.  - Left knee: There is mild ecchymosis from the prior aspiration and subsequent gel injection in the superior lateral aspect of the knee.  No redness or warmth of the knee.  Patient walks with an antalgic gait. Generalized TTP.  Imaging:  07/13/23: Right shoulder 2 views: No acute fractures.  Shoulder is located.   End-stage glenohumeral joint arthritis.  Mild AC joint changes   Past Medical/Family/Surgical/Social History: Medications & Allergies reviewed per EMR, new medications updated. Patient Active Problem List   Diagnosis Date Noted   Pulmonary nodule 06/11/2023   Pyelonephritis 06/11/2023   Acute right-sided thoracic back pain 05/25/2023   AAA (abdominal aortic aneurysm) (HCC) 05/23/2023   Head trauma 04/08/2023   Contusion of left knee 04/08/2023   Rhinorrhea 04/08/2023   Pain in left knee 04/03/2023   Right leg swelling 10/19/2022   Pruritus 08/18/2022   Other fatigue 12/06/2021   Chronic pain syndrome    Rotator cuff syndrome of right shoulder 01/21/2019   Bladder prolapse, female, acquired 08/16/2018   Urinary  incontinence 07/05/2018   Adjustment reaction with anxiety and depression 03/26/2017   Iron deficiency anemia due to chronic blood loss 10/31/2015   Chronic right-sided thoracic back pain 07/26/2015   Spinal stenosis of lumbar region 07/10/2015   Abdominal aortic atherosclerosis (HCC) 07/10/2015   Sleep apnea 05/22/2010   Hereditary and idiopathic peripheral neuropathy 02/06/2010   Irritable bowel syndrome 11/12/2007   Diabetes mellitus type 2, controlled, with complications (HCC) 10/08/2006   HYPERCHOLESTEROLEMIA 10/08/2006   HYPERTRIGLYCERIDEMIA 10/08/2006   Migraine headache 10/08/2006   Hypertension 10/08/2006   Asthma 10/08/2006   Reflux esophagitis 10/08/2006   Peptic ulcer 10/08/2006   Eczema 10/08/2006    Psoriasis 10/08/2006   Osteoarthritis, multiple sites 10/08/2006   VERTIGO NOS OR DIZZINESS 10/08/2006   Past Medical History:  Diagnosis Date   Asthma    Depression    Diabetes mellitus without complication (HCC)    History of surgery on arm    right arm ( plate and screws)   Hypertension    Macular degeneration    Migraine    without aura   Pneumonia    Urinary incontinence    Family History  Problem Relation Age of Onset   Hypertension Mother    Hypertension Father    Hearing loss Sister    Heart attack Sister 6       s/p 4 bypasses    Stroke Brother        17   Breast cancer Maternal Aunt    Ovarian cancer Maternal Aunt    Past Surgical History:  Procedure Laterality Date   ABDOMINAL HYSTERECTOMY     ?Lt. ovary remains   APPENDECTOMY     Arm surgery     BREAST BIOPSY Right 10/10/2009   BREAST BIOPSY Right 09/17/2022   MM RT BREAST BX W LOC DEV 1ST LESION IMAGE BX SPEC STEREO GUIDE 09/17/2022 [facility]   BREAST CYST ASPIRATION  12/20/2013   BREAST EXCISIONAL BIOPSY Left 1998   BREAST EXCISIONAL BIOPSY Right 1993   ESOPHAGOGASTRODUODENOSCOPY N/A 04/17/2021   Procedure: ESOPHAGOGASTRODUODENOSCOPY (EGD);  Surgeon: Kristie Lamprey, MD;  Location: [facility] ENDOSCOPY;  Service: Endoscopy;  Laterality: N/A;  Rectal bleeding and anemia.   ESOPHAGOGASTRODUODENOSCOPY N/A 04/18/2021   Procedure: ESOPHAGOGASTRODUODENOSCOPY (EGD);  Surgeon: Rollin Dover, MD;  Location: [facility] ENDOSCOPY;  Service: Endoscopy;  Laterality: N/A;   HOT HEMOSTASIS N/A 04/18/2021   Procedure: HOT HEMOSTASIS (ARGON PLASMA COAGULATION/BICAP);  Surgeon: Rollin Dover, MD;  Location: [facility] ENDOSCOPY;  Service: Endoscopy;  Laterality: N/A;   IR ANGIOGRAM SELECTIVE EACH ADDITIONAL VESSEL  04/17/2021   IR ANGIOGRAM VISCERAL SELECTIVE  04/17/2021   IR EMBO ART  VEN HEMORR LYMPH EXTRAV  INC GUIDE ROADMAPPING  04/17/2021   IR US  GUIDE VASC ACCESS RIGHT  04/17/2021   RADIOLOGY WITH ANESTHESIA N/A 04/17/2021   Procedure: RADIOLOGY  WITH ANESTHESIA;  Surgeon: Radiologist, Medication, MD;  Location: [facility] OR;  Service: Radiology;  Laterality: N/A;   SUBMUCOSAL INJECTION  04/18/2021   Procedure: SUBMUCOSAL INJECTION;  Surgeon: Rollin Dover, MD;  Location: [facility] ENDOSCOPY;  Service: Endoscopy;;   Social History   Occupational History   Occupation: Retired  Tobacco Use   Smoking status: Former    Current packs/day: 0.00    Average packs/day: 2.0 packs/day for 33.6 years (67.3 ttl pk-yrs)    Types: Cigarettes    Start date: 08/11/1957    Quit date: 04/04/1991    Years since quitting: 32.3   Smokeless tobacco: Never  Vaping Use   Vaping status:  Never Used  Substance and Sexual Activity   Alcohol use: No   Drug use: No   Sexual activity: Not Currently

## 2023-08-14 NOTE — Progress Notes (Signed)
 Patient says that she had about 2 days of relief in the shoulder after her last visit and then her pain went back to the way that it was. She says that it is very bothersome today.

## 2023-08-14 NOTE — Telephone Encounter (Signed)
 Covering for Dr. Rumball.  PDMP reviewed. Had 60 tabs filled 12/2 and 10 tabs filled 12/28. Has appointment on 1/10 with PCP. I will send in one week's worth for her to last until that appointment.   Please inform patient of message Dr. Madelon wrote on 12/27:  Will send short course of oxycodone  to last until next appt with me. Should aim to take 0.5 tablets and only take additional half tablets if pain uncontrolled after about an hour. Ultimately, will not pursue increased dosages long term given risk due to h/o multiple falls. She should make sure she is taking her cymbalta  and lyrica  as these will likely have better benefit and less risk than increasing her oxycodone . We will talk about potential options going forward at her f/u appt.     Laymon JINNY Legions, MD

## 2023-08-15 ENCOUNTER — Other Ambulatory Visit: Payer: Self-pay | Admitting: Family Medicine

## 2023-08-17 MED ORDER — METOPROLOL SUCCINATE ER 25 MG PO TB24: 25.0000 mg | ORAL_TABLET | Freq: Every day | ORAL | 3 refills | Status: DC

## 2023-08-19 ENCOUNTER — Telehealth: Payer: Self-pay

## 2023-08-19 NOTE — Telephone Encounter (Signed)
 Patient called stating that she is still having pain from her knees and that she has tried ice, heat, and elevation.  Stated that the ice made it worse.  Would like a CB to discuss what can be done.  CB# [phone].  Please advise.  Thank you.

## 2023-08-20 ENCOUNTER — Telehealth: Payer: Self-pay | Admitting: Radiology

## 2023-08-20 ENCOUNTER — Telehealth: Payer: Self-pay

## 2023-08-20 DIAGNOSIS — G8929 Other chronic pain: Secondary | ICD-10-CM

## 2023-08-20 DIAGNOSIS — M159 Polyosteoarthritis, unspecified: Secondary | ICD-10-CM

## 2023-08-20 MED ORDER — OXYCODONE-ACETAMINOPHEN 5-325 MG PO TABS: 0.5000 | ORAL_TABLET | Freq: Four times a day (QID) | ORAL | 0 refills | Status: DC | PRN

## 2023-08-20 NOTE — Telephone Encounter (Signed)
 Done

## 2023-08-20 NOTE — Telephone Encounter (Signed)
 Patient calls nurse line in regards to apt and pain medication.   She reports she has an apt tomorrow with PCP, however states [facility] cancelled all rides due to the impending weather.   She has been rescheduled for 01/17.  She is requesting the rest of her pain medication.   Donah only gave her #15 on 08/14/2023.  Will forward to PCP.

## 2023-08-20 NOTE — Telephone Encounter (Signed)
 Patient states she had shoulder injection by Dr. Shon Baton & wanted to know how long it would take affect she is still in pain.  Please return call. Thank you.

## 2023-08-20 NOTE — Telephone Encounter (Signed)
 Made appt for 08/31/23 @ 1:15

## 2023-08-20 NOTE — Telephone Encounter (Signed)
 Patient returns call to nurse line regarding the quantity of pain meds that were sent in. She reports that 15 pills is only a one week's supply and this will be cutting it close to get her until her next appointment.   She states that she is unsure why she only received 15 pills.   Will forward concern to PCP.   Chiquita JAYSON English, RN

## 2023-08-21 ENCOUNTER — Ambulatory Visit: Payer: PPO | Admitting: Family Medicine

## 2023-08-21 NOTE — Telephone Encounter (Signed)
 Called patient and advised that most injections start kicking in within 2-3 days although sometimes can take 1-2 weeks to fully kick in. Also reminded her that if she is not receiving great relief it could be due to her advanced shoulder arthritis. She will make note of how much relief she is getting to discuss with Dr. Addie at her follow up appointment at the end of the month.

## 2023-08-21 NOTE — Telephone Encounter (Signed)
 Should last her until next appointment. Should continue to take lyrica and cymbalta and do the stretches as instructed by Ortho and only take the percocet as needed for breakthrough pain.

## 2023-08-24 NOTE — Telephone Encounter (Signed)
 Called patient and advised of provider message. Patient reports that she sometimes has to take additional half tablet 1 hour after taking initial dose if pain is still significant. She states that she has discussed this with Dr. Rumball and was told that this was fine.   She states that she has had to do this twice since picking up prescription on 08/20/23.  She currently has six pills left.   Patient is also asking about referral to pain management. Advised that she could discuss this at upcoming office visit, as referrals needs office notes attached.   She reports that if she has to take additional pill after the 1 hour mark that she will be out prior to this week's appointment.   Please advise.   Debra JAYSON English, RN

## 2023-08-24 NOTE — Telephone Encounter (Signed)
 Patient returns call to nurse line. She wanted to make provider aware that she is also taking 2 extra strength tylenol  (500 mg per tablet) every time she takes the pain medication.   Advised patient that pain medication also contains tylenol  and that taking extra tylenol  could cause adverse effects. Patient reports that she is aware of this, however, this is the only way that she has any relief.   She asks that I forward this message to PCP.   Chiquita JAYSON English, RN

## 2023-08-25 ENCOUNTER — Other Ambulatory Visit: Payer: Self-pay

## 2023-08-25 MED ORDER — PREGABALIN 50 MG PO CAPS: 50.0000 mg | ORAL_CAPSULE | Freq: Every evening | ORAL | 1 refills | Status: DC | PRN

## 2023-08-28 ENCOUNTER — Ambulatory Visit (INDEPENDENT_AMBULATORY_CARE_PROVIDER_SITE_OTHER): Payer: PPO | Admitting: Family Medicine

## 2023-08-28 ENCOUNTER — Encounter: Payer: Self-pay | Admitting: Family Medicine

## 2023-08-28 VITALS — BP 156/99 | HR 104 | Ht 59.0 in | Wt 165.4 lb

## 2023-08-28 DIAGNOSIS — G9059 Complex regional pain syndrome I of other specified site: Secondary | ICD-10-CM

## 2023-08-28 DIAGNOSIS — Z7985 Long-term (current) use of injectable non-insulin antidiabetic drugs: Secondary | ICD-10-CM | POA: Diagnosis not present

## 2023-08-28 DIAGNOSIS — I10 Essential (primary) hypertension: Secondary | ICD-10-CM | POA: Diagnosis not present

## 2023-08-28 DIAGNOSIS — K279 Peptic ulcer, site unspecified, unspecified as acute or chronic, without hemorrhage or perforation: Secondary | ICD-10-CM | POA: Diagnosis not present

## 2023-08-28 DIAGNOSIS — G609 Hereditary and idiopathic neuropathy, unspecified: Secondary | ICD-10-CM | POA: Diagnosis not present

## 2023-08-28 DIAGNOSIS — E118 Type 2 diabetes mellitus with unspecified complications: Secondary | ICD-10-CM | POA: Diagnosis not present

## 2023-08-28 DIAGNOSIS — M546 Pain in thoracic spine: Secondary | ICD-10-CM | POA: Diagnosis not present

## 2023-08-28 DIAGNOSIS — M159 Polyosteoarthritis, unspecified: Secondary | ICD-10-CM | POA: Diagnosis not present

## 2023-08-28 DIAGNOSIS — G8929 Other chronic pain: Secondary | ICD-10-CM

## 2023-08-28 LAB — POCT GLYCOSYLATED HEMOGLOBIN (HGB A1C): HbA1c, POC (controlled diabetic range): 5.8 % (ref 0.0–7.0)

## 2023-08-28 MED ORDER — PANTOPRAZOLE SODIUM 40 MG PO TBEC
40.0000 mg | DELAYED_RELEASE_TABLET | Freq: Every day | ORAL | 3 refills | Status: AC
  Filled 2024-03-30 – 2024-05-18 (×2): qty 90, 90d supply, fill #0
  Filled 2024-08-22: qty 90, 90d supply, fill #1

## 2023-08-28 MED ORDER — BACLOFEN 20 MG PO TABS: 20.0000 mg | ORAL_TABLET | Freq: Two times a day (BID) | ORAL | 1 refills | Status: DC | PRN

## 2023-08-28 MED ORDER — ONDANSETRON 4 MG PO TBDP: 4.0000 mg | ORAL_TABLET | Freq: Three times a day (TID) | ORAL | 0 refills | Status: DC | PRN

## 2023-08-28 MED ORDER — OXYCODONE-ACETAMINOPHEN 5-325 MG PO TABS: 0.5000 | ORAL_TABLET | Freq: Four times a day (QID) | ORAL | 0 refills | Status: DC | PRN

## 2023-08-28 MED ORDER — DULOXETINE HCL 60 MG PO CPEP: 60.0000 mg | ORAL_CAPSULE | Freq: Every day | ORAL | 3 refills | Status: DC

## 2023-08-28 MED ORDER — LIDOCAINE 5 % EX OINT: TOPICAL_OINTMENT | CUTANEOUS | 2 refills | Status: DC

## 2023-08-28 NOTE — Assessment & Plan Note (Addendum)
Agree may be more CRPS, meeting 3 of the 4 clinical criteria (allodynia, temperature asymmetry, sweating asymmetry). Increase cymbalta to 60mg . Continue lyrica, percocet, baclofen prn. Cautious with increasing narcotics, muscle relaxant given age, h/o falls and confusion, lives alone. Referred to pain management. Continue to follow with Ortho, NSG. F/u 3 mths.

## 2023-08-28 NOTE — Progress Notes (Signed)
   SUBJECTIVE:   CHIEF COMPLAINT / HPI:   Diabetes, Type 2 - Last A1c 9.8 - Medications: ozempic 1mg . Gave lantus in case of hyperglycemia with CSI with Ortho. - Compliance: Never took lantus, sugars never high enough. Hasn't taken ozempic in about a month due to lower blood sugars. - Checking BG at home: yes, low 100s - Eye exam: UTD - Foot exam: due - Microalbumin: due - Statin: yes - PNA vaccine: due  R thoracic back pain - Chronic. Prior PCP thinking may be complex regional pain syndrome. Following with Ortho, also thinking shoulder arthritis may be contributing, but only got a few days relief with IA CSI. On chronic narcotics. States pain hasn't changed but is requiring more frequent doses of narcotic. Also taking additional extra strength tylenol with her percocet. Wanting to see pain management, previously saw before but not sure why she stopped seeing them. Shots help sometimes. The only thing that helps consistently is laying down on the floor. Feels like knife stabbing. Unsure if lyrica is helping, previously on BID dosing but had to cut back to daily due to nausea. No falls, dizziness since last appointment. BMs and urine unchanged.    OBJECTIVE:   BP (!) 156/99   Pulse (!) 104   Ht 4\' 11"  (1.499 m)   Wt 165 lb 6.4 oz (75 kg)   SpO2 90%   BMI 33.41 kg/m   Gen: elderly. Nontoxic appearing, intermittently writhing in pain. Mildly diaphoretic. MSK: no midline spinal tenderness. Tender with light palpation to skin overlying R sided thorax. No paravertebral hypertonicity or overlying skin changes though warm and mildly diaphoretic.   ASSESSMENT/PLAN:   Diabetes mellitus type 2, controlled, with complications (HCC) Obtain A1c and adjust regimen as indicated. Continue to hold ozempic for now. Offered foot exam, patient declines today, obtain on follow up. UACR obtained today.  Chronic right-sided thoracic back pain Agree may be more CRPS, meeting 3 of the 4 clinical criteria  (allodynia, temperature asymmetry, sweating asymmetry). Increase cymbalta to 60mg . Continue lyrica, percocet, baclofen prn. Cautious with increasing narcotics, muscle relaxant given age, h/o falls and confusion, lives alone. Referred to pain management. Continue to follow with Ortho, NSG. F/u 3 mths.     Caro Laroche, DO

## 2023-08-28 NOTE — Assessment & Plan Note (Signed)
Obtain A1c and adjust regimen as indicated. Continue to hold ozempic for now. Offered foot exam, patient declines today, obtain on follow up. UACR obtained today.

## 2023-08-28 NOTE — Patient Instructions (Signed)
It was great to see you!  Our plans for today:  - We are increasing your duloxetine (cymbalta).  - We sent refills of your baclofen and zofran to the pharmacy. - We are referring you to pain clinic. Let us know if you don't hear about an appointment in the next few weeks.   We are checking some labs today, we will release these results to your MyChart.  Take care and seek immediate care sooner if you develop any concerns.   Dr. Linwood Dibbles

## 2023-08-31 ENCOUNTER — Ambulatory Visit: Payer: PPO | Admitting: Physician Assistant

## 2023-08-31 ENCOUNTER — Other Ambulatory Visit (INDEPENDENT_AMBULATORY_CARE_PROVIDER_SITE_OTHER): Payer: PPO

## 2023-08-31 ENCOUNTER — Encounter: Payer: Self-pay | Admitting: Physician Assistant

## 2023-08-31 DIAGNOSIS — M5442 Lumbago with sciatica, left side: Secondary | ICD-10-CM | POA: Diagnosis not present

## 2023-08-31 DIAGNOSIS — G8929 Other chronic pain: Secondary | ICD-10-CM | POA: Diagnosis not present

## 2023-08-31 LAB — MICROALBUMIN / CREATININE URINE RATIO

## 2023-08-31 NOTE — Progress Notes (Signed)
HPI: [name] comes in today due to continued left lower leg pain.  She states she has pain in her lower leg from her groin down into her left foot.  She was seen last on 08/10/2023 and was given a Monovisc injection in her left knee.  She states the pain is gotten worse since the injection.  She is having significant back pain.  She has seen her primary care physician has referred her to pain management.  She is currently taking baclofen and Lyrica and oxycodone without any relief.  She states she has to lay on the floor to get any relief.  In regards to her back pain.  She walks around the room grabbing her low back and periodically having her daughter massage her back in the office today.  Review of systems: See HPI otherwise negative  Physical exam: General Well-developed well-nourished female no acute distress .  Obvious discomfort secondary to back pain. Psych: Alert and oriented x 3 Lower extremities: 5 out of 5 strength throughout the lower extremities except for dorsiflexion of the left foot 4 out of 5 in hip flexion on the left is 4 out of 5.  Straight leg raise causes pain of the leg more consistent with tight hamstrings.  Tenderness left calf.  Good range of motion left knee no abnormal warmth erythema of the knee.  Lumbar spine: 2 views compared to films on 11/17/2022 showed no significant change.  Grade 1 spondylolisthesis L3-L4 unchanged.  Facet changes of the lower lumbar spine.  Scoliosis.  Arthrosclerosis of the aorta.  Degenerative disc disease at L4-5.  Impression: Lumbar radiculopathy left lower extremity Left leg calf pain  Plan: Recommend she contact Dr. Verlee Rossetti office to be seen for her back as she has been seen by Dr. Danielle Dess in the past.  Will order a Doppler of her left lower leg due to complaint of left leg swelling and calf pain on exam today.  Questions were encouraged and answered at length today.

## 2023-09-01 ENCOUNTER — Other Ambulatory Visit: Payer: Self-pay | Admitting: Radiology

## 2023-09-01 ENCOUNTER — Other Ambulatory Visit: Payer: Self-pay | Admitting: Family Medicine

## 2023-09-01 DIAGNOSIS — M159 Polyosteoarthritis, unspecified: Secondary | ICD-10-CM

## 2023-09-01 DIAGNOSIS — G894 Chronic pain syndrome: Secondary | ICD-10-CM

## 2023-09-01 DIAGNOSIS — M48062 Spinal stenosis, lumbar region with neurogenic claudication: Secondary | ICD-10-CM

## 2023-09-01 DIAGNOSIS — M79662 Pain in left lower leg: Secondary | ICD-10-CM

## 2023-09-01 DIAGNOSIS — E118 Type 2 diabetes mellitus with unspecified complications: Secondary | ICD-10-CM

## 2023-09-01 DIAGNOSIS — G8929 Other chronic pain: Secondary | ICD-10-CM

## 2023-09-01 LAB — COMPREHENSIVE METABOLIC PANEL
ALT: 20 [IU]/L (ref 0–32)
AST: 41 [IU]/L — ABNORMAL HIGH (ref 0–40)
Albumin: 4.3 g/dL (ref 3.7–4.7)
Alkaline Phosphatase: 93 [IU]/L (ref 44–121)
BUN/Creatinine Ratio: 13 (ref 12–28)
BUN: 17 mg/dL (ref 8–27)
Bilirubin Total: 0.4 mg/dL (ref 0.0–1.2)
CO2: 23 mmol/L (ref 20–29)
Calcium: 9.6 mg/dL (ref 8.7–10.3)
Chloride: 99 mmol/L (ref 96–106)
Creatinine, Ser: 1.33 mg/dL — ABNORMAL HIGH (ref 0.57–1.00)
Globulin, Total: 2.3 g/dL (ref 1.5–4.5)
Glucose: 82 mg/dL (ref 70–99)
Potassium: 4.5 mmol/L (ref 3.5–5.2)
Sodium: 139 mmol/L (ref 134–144)
Total Protein: 6.6 g/dL (ref 6.0–8.5)
eGFR: 40 mL/min/{1.73_m2} — ABNORMAL LOW (ref >59)

## 2023-09-01 LAB — MICROALBUMIN / CREATININE URINE RATIO

## 2023-09-02 ENCOUNTER — Ambulatory Visit (HOSPITAL_COMMUNITY): Payer: PPO

## 2023-09-03 ENCOUNTER — Other Ambulatory Visit: Payer: Self-pay

## 2023-09-03 DIAGNOSIS — L309 Dermatitis, unspecified: Secondary | ICD-10-CM | POA: Diagnosis not present

## 2023-09-03 DIAGNOSIS — F4323 Adjustment disorder with mixed anxiety and depressed mood: Secondary | ICD-10-CM | POA: Diagnosis not present

## 2023-09-03 DIAGNOSIS — L409 Psoriasis, unspecified: Secondary | ICD-10-CM | POA: Diagnosis not present

## 2023-09-03 DIAGNOSIS — I714 Abdominal aortic aneurysm, without rupture, unspecified: Secondary | ICD-10-CM | POA: Diagnosis not present

## 2023-09-03 DIAGNOSIS — R32 Unspecified urinary incontinence: Secondary | ICD-10-CM | POA: Diagnosis not present

## 2023-09-03 DIAGNOSIS — D500 Iron deficiency anemia secondary to blood loss (chronic): Secondary | ICD-10-CM | POA: Diagnosis not present

## 2023-09-03 DIAGNOSIS — K2100 Gastro-esophageal reflux disease with esophagitis, without bleeding: Secondary | ICD-10-CM | POA: Diagnosis not present

## 2023-09-03 DIAGNOSIS — M19011 Primary osteoarthritis, right shoulder: Secondary | ICD-10-CM | POA: Diagnosis not present

## 2023-09-03 DIAGNOSIS — G609 Hereditary and idiopathic neuropathy, unspecified: Secondary | ICD-10-CM | POA: Diagnosis not present

## 2023-09-03 DIAGNOSIS — K589 Irritable bowel syndrome without diarrhea: Secondary | ICD-10-CM | POA: Diagnosis not present

## 2023-09-03 DIAGNOSIS — J45909 Unspecified asthma, uncomplicated: Secondary | ICD-10-CM | POA: Diagnosis not present

## 2023-09-03 DIAGNOSIS — M1712 Unilateral primary osteoarthritis, left knee: Secondary | ICD-10-CM

## 2023-09-03 DIAGNOSIS — E7800 Pure hypercholesterolemia, unspecified: Secondary | ICD-10-CM | POA: Diagnosis not present

## 2023-09-03 DIAGNOSIS — G9059 Complex regional pain syndrome I of other specified site: Secondary | ICD-10-CM | POA: Diagnosis not present

## 2023-09-03 DIAGNOSIS — M25562 Pain in left knee: Secondary | ICD-10-CM | POA: Diagnosis not present

## 2023-09-03 DIAGNOSIS — M48061 Spinal stenosis, lumbar region without neurogenic claudication: Secondary | ICD-10-CM | POA: Diagnosis not present

## 2023-09-03 DIAGNOSIS — Z9849 Cataract extraction status, unspecified eye: Secondary | ICD-10-CM | POA: Diagnosis not present

## 2023-09-03 DIAGNOSIS — I700 Atherosclerosis of aorta: Secondary | ICD-10-CM | POA: Diagnosis not present

## 2023-09-03 DIAGNOSIS — R911 Solitary pulmonary nodule: Secondary | ICD-10-CM | POA: Diagnosis not present

## 2023-09-03 DIAGNOSIS — E119 Type 2 diabetes mellitus without complications: Secondary | ICD-10-CM | POA: Diagnosis not present

## 2023-09-03 DIAGNOSIS — G43909 Migraine, unspecified, not intractable, without status migrainosus: Secondary | ICD-10-CM | POA: Diagnosis not present

## 2023-09-03 DIAGNOSIS — I10 Essential (primary) hypertension: Secondary | ICD-10-CM | POA: Diagnosis not present

## 2023-09-03 DIAGNOSIS — K279 Peptic ulcer, site unspecified, unspecified as acute or chronic, without hemorrhage or perforation: Secondary | ICD-10-CM | POA: Diagnosis not present

## 2023-09-03 DIAGNOSIS — M546 Pain in thoracic spine: Secondary | ICD-10-CM | POA: Diagnosis not present

## 2023-09-03 DIAGNOSIS — Z90710 Acquired absence of both cervix and uterus: Secondary | ICD-10-CM | POA: Diagnosis not present

## 2023-09-07 ENCOUNTER — Ambulatory Visit: Payer: PPO | Admitting: Orthopedic Surgery

## 2023-09-07 ENCOUNTER — Encounter: Payer: Self-pay | Admitting: Orthopedic Surgery

## 2023-09-07 DIAGNOSIS — M792 Neuralgia and neuritis, unspecified: Secondary | ICD-10-CM | POA: Diagnosis not present

## 2023-09-07 NOTE — Progress Notes (Unsigned)
Office Visit Note   Patient: [name]           Date of Birth: [birth year]           MRN: [id number] Visit Date: 09/07/2023 Requested by: Caro Laroche, DO [address] [place],  [place] [address] PCP: Caro Laroche, DO  Subjective: Chief Complaint  Patient presents with   Right Shoulder - Follow-up    HPI: [name] is a 83 y.o. female who presents to the office reporting right shoulder and scapular pain.  Had prior ultrasound-guided injection with Dr. Shon Baton on 08/14/2023.  That gave her marginal relief.  She ambulates with a walker.  She is a patient of Dr. Danielle Dess.  She has had epidural steroid injections in her lumbar spine.  No history of recent neck MRI scan.  She states that most if not all of her pain is in the scapula region.  She is in pain management.  Shoulder symptoms ongoing for a year.  She does have known history of arthritis in that glenohumeral joint..                ROS: All systems reviewed are negative as they relate to the chief complaint within the history of present illness.  Patient denies fevers or chills.  Assessment & Plan: Visit Diagnoses:  1. Radicular pain in right arm     Plan: Impression is primarily scapular pain in this patient which is very broad and not focal.  This looks like he could be referred pain from the neck.  She had a glenohumeral joint injection which gave her 1 to 2 days of mild relief.  I think she does have shoulder arthritis but the scapular pain warrants further workup with C-spine MRI before we consider shoulder replacement.  She really wants to get rid of the scapular pain.  Follow-Up Instructions: No follow-ups on file.   Orders:  Orders Placed This Encounter  Procedures   MR Cervical Spine w/o contrast   No orders of the defined types were placed in this encounter.     Procedures: No procedures performed   Clinical Data: No additional findings.  Objective: Vital Signs: There were no vitals taken  for this visit.  Physical Exam:  Constitutional: Patient appears well-developed HEENT:  Head: Normocephalic Eyes:EOM are normal Neck: Normal range of motion Cardiovascular: Normal rate Pulmonary/chest: Effort normal Neurologic: Patient is alert Skin: Skin is warm Psychiatric: Patient has normal mood and affect  Ortho Exam: Ortho exam demonstrates external rotation of the right of 60 on the left 75.  Rotator cuff strength is intact infraspinatus supraspinatus and subscap muscle testing.  Does have some coarseness with passive range of motion.  Has a lot of scapular pain in the region of the medial border of the scapula but is not really particularly tender to touch.  No rashes in this region.  Specialty Comments:  No specialty comments available.  Imaging: No results found.   PMFS History: Patient Active Problem List   Diagnosis Date Noted   Pulmonary nodule 06/11/2023   Pyelonephritis 06/11/2023   AAA (abdominal aortic aneurysm) (HCC) 05/23/2023   Head trauma 04/08/2023   Contusion of left knee 04/08/2023   Rhinorrhea 04/08/2023   Pain in left knee 04/03/2023   Right leg swelling 10/19/2022   Pruritus 08/18/2022   Other fatigue 12/06/2021   Chronic pain syndrome    Rotator cuff syndrome of right shoulder 01/21/2019   Bladder prolapse, female, acquired 08/16/2018  Urinary incontinence 07/05/2018   Adjustment reaction with anxiety and depression 03/26/2017   Iron deficiency anemia due to chronic blood loss 10/31/2015   Chronic right-sided thoracic back pain 07/26/2015   Spinal stenosis of lumbar region 07/10/2015   Abdominal aortic atherosclerosis (HCC) 07/10/2015   Sleep apnea 05/22/2010   Hereditary and idiopathic peripheral neuropathy 02/06/2010   Irritable bowel syndrome 11/12/2007   Diabetes mellitus type 2, controlled, with complications (HCC) 10/08/2006   HYPERCHOLESTEROLEMIA 10/08/2006   HYPERTRIGLYCERIDEMIA 10/08/2006   Migraine headache 10/08/2006    Hypertension 10/08/2006   Asthma 10/08/2006   Reflux esophagitis 10/08/2006   Peptic ulcer 10/08/2006   Eczema 10/08/2006   Psoriasis 10/08/2006   Osteoarthritis, multiple sites 10/08/2006   VERTIGO NOS OR DIZZINESS 10/08/2006   Past Medical History:  Diagnosis Date   Asthma    Depression    Diabetes mellitus without complication (HCC)    History of surgery on arm    right arm ( plate and screws)   Hypertension    Macular degeneration    Migraine    without aura   Pneumonia    Urinary incontinence     Family History  Problem Relation Age of Onset   Hypertension Mother    Hypertension Father    Hearing loss Sister    Heart attack Sister 2       s/p 4 bypasses    Stroke Brother        25   Breast cancer Maternal Aunt    Ovarian cancer Maternal Aunt     Past Surgical History:  Procedure Laterality Date   ABDOMINAL HYSTERECTOMY     ?Lt. ovary remains   APPENDECTOMY     Arm surgery     BREAST BIOPSY Right 10/10/2009   BREAST BIOPSY Right 09/17/2022   MM RT BREAST BX W LOC DEV 1ST LESION IMAGE BX SPEC STEREO GUIDE 09/17/2022 [facility]   BREAST CYST ASPIRATION  12/20/2013   BREAST EXCISIONAL BIOPSY Left 1998   BREAST EXCISIONAL BIOPSY Right 1993   ESOPHAGOGASTRODUODENOSCOPY N/A 04/17/2021   Procedure: ESOPHAGOGASTRODUODENOSCOPY (EGD);  Surgeon: Charna Elizabeth, MD;  Location: [facility] ENDOSCOPY;  Service: Endoscopy;  Laterality: N/A;  Rectal bleeding and anemia.   ESOPHAGOGASTRODUODENOSCOPY N/A 04/18/2021   Procedure: ESOPHAGOGASTRODUODENOSCOPY (EGD);  Surgeon: Jeani Hawking, MD;  Location: [facility] ENDOSCOPY;  Service: Endoscopy;  Laterality: N/A;   HOT HEMOSTASIS N/A 04/18/2021   Procedure: HOT HEMOSTASIS (ARGON PLASMA COAGULATION/BICAP);  Surgeon: Jeani Hawking, MD;  Location: [facility] ENDOSCOPY;  Service: Endoscopy;  Laterality: N/A;   IR ANGIOGRAM SELECTIVE EACH ADDITIONAL VESSEL  04/17/2021   IR ANGIOGRAM VISCERAL SELECTIVE  04/17/2021   IR EMBO ART  VEN HEMORR LYMPH EXTRAV  INC GUIDE  ROADMAPPING  04/17/2021   IR US GUIDE VASC ACCESS RIGHT  04/17/2021   RADIOLOGY WITH ANESTHESIA N/A 04/17/2021   Procedure: RADIOLOGY WITH ANESTHESIA;  Surgeon: Radiologist, Medication, MD;  Location: [facility] OR;  Service: Radiology;  Laterality: N/A;   SUBMUCOSAL INJECTION  04/18/2021   Procedure: SUBMUCOSAL INJECTION;  Surgeon: Jeani Hawking, MD;  Location: [facility] ENDOSCOPY;  Service: Endoscopy;;   Social History   Occupational History   Occupation: Retired  Tobacco Use   Smoking status: Former    Current packs/day: 0.00    Average packs/day: 2.0 packs/day for 33.6 years (67.3 ttl pk-yrs)    Types: Cigarettes    Start date: 08/11/1957    Quit date: 04/04/1991    Years since quitting: 32.4   Smokeless tobacco: Never  Vaping Use  Vaping status: Never Used  Substance and Sexual Activity   Alcohol use: No   Drug use: No   Sexual activity: Not Currently

## 2023-09-08 ENCOUNTER — Telehealth: Payer: Self-pay

## 2023-09-08 ENCOUNTER — Encounter (HOSPITAL_COMMUNITY): Payer: Self-pay

## 2023-09-08 ENCOUNTER — Ambulatory Visit (HOSPITAL_COMMUNITY): Payer: PPO

## 2023-09-08 ENCOUNTER — Telehealth: Payer: Self-pay | Admitting: Orthopedic Surgery

## 2023-09-08 NOTE — Telephone Encounter (Signed)
Patient has been scheduled for two different appointments for Doppler and cancelled, are we to continue to reschedule?

## 2023-09-08 NOTE — Telephone Encounter (Signed)
Pt called requesting valium for upcoming MRI 2/18 to keep her calm. Please send to [facility]. Pt phone number is [phone]

## 2023-09-08 NOTE — Telephone Encounter (Signed)
Leah from [facility] calling for PT verbal orders as follows:  2 time(s) weekly for 2 week(s), then 1 time(s) weekly for 6 week(s)  She also is requesting a verbal order to try Dry Needling on the patient for pain relief.   Will send to PCP for advisement before I give verbal ok.

## 2023-09-09 ENCOUNTER — Other Ambulatory Visit: Payer: Self-pay | Admitting: Surgical

## 2023-09-09 MED ORDER — DIAZEPAM 5 MG PO TABS
5.0000 mg | ORAL_TABLET | Freq: Once | ORAL | 0 refills | Status: AC
Start: 2023-09-09 — End: 2023-09-09

## 2023-09-09 NOTE — Telephone Encounter (Signed)
Attempted to call Leah from [facility] to inform her the verbal orders per Dr. Linwood Dibbles about patient [name] PT and Dry Needling for pain relief.  Message on Leah's VM box did state that can leave verbal orders from healthcare providers.  LVM with the instructions from Dr. Linwood Dibbles.   Drusilla Kanner, CMA

## 2023-09-09 NOTE — Telephone Encounter (Signed)
I called patient and advised.

## 2023-09-09 NOTE — Telephone Encounter (Signed)
Sent in

## 2023-09-10 ENCOUNTER — Ambulatory Visit: Payer: PPO | Admitting: Podiatry

## 2023-09-14 ENCOUNTER — Ambulatory Visit: Payer: PPO | Admitting: Podiatry

## 2023-09-14 ENCOUNTER — Encounter: Payer: Self-pay | Admitting: Podiatry

## 2023-09-14 DIAGNOSIS — M79675 Pain in left toe(s): Secondary | ICD-10-CM | POA: Diagnosis not present

## 2023-09-14 DIAGNOSIS — B351 Tinea unguium: Secondary | ICD-10-CM

## 2023-09-14 DIAGNOSIS — D2371 Other benign neoplasm of skin of right lower limb, including hip: Secondary | ICD-10-CM | POA: Diagnosis not present

## 2023-09-14 DIAGNOSIS — E118 Type 2 diabetes mellitus with unspecified complications: Secondary | ICD-10-CM | POA: Diagnosis not present

## 2023-09-14 DIAGNOSIS — M79674 Pain in right toe(s): Secondary | ICD-10-CM

## 2023-09-14 DIAGNOSIS — E119 Type 2 diabetes mellitus without complications: Secondary | ICD-10-CM

## 2023-09-14 NOTE — Progress Notes (Unsigned)
Subjective:   Patient ID: [name], female   DOB: 83 y.o.   MRN: [id number]   HPI Chief Complaint  Patient presents with   Plantar Warts    RM#11 Patient states thinks she has a wart on left heel.   83 year old female presents the office with above concerns.  She thinks that she has a wart on the bottom of her RIGHT heel, which has been on the last few months.  The area is tender with pressure.  She also states that she needs her nails trimmed as they are thick and elongated she cannot trim them herself and causing discomfort.  No open lesions that she reports.  No other treatment.  No other concerns.  Last blood sugar was 106 today  Review of Systems  All other systems reviewed and are negative.  Past Medical History:  Diagnosis Date   Asthma    Depression    Diabetes mellitus without complication (HCC)    History of surgery on arm    right arm ( plate and screws)   Hypertension    Macular degeneration    Migraine    without aura   Pneumonia    Urinary incontinence     Past Surgical History:  Procedure Laterality Date   ABDOMINAL HYSTERECTOMY     ?Lt. ovary remains   APPENDECTOMY     Arm surgery     BREAST BIOPSY Right 10/10/2009   BREAST BIOPSY Right 09/17/2022   MM RT BREAST BX W LOC DEV 1ST LESION IMAGE BX SPEC STEREO GUIDE 09/17/2022 [facility]   BREAST CYST ASPIRATION  12/20/2013   BREAST EXCISIONAL BIOPSY Left 1998   BREAST EXCISIONAL BIOPSY Right 1993   ESOPHAGOGASTRODUODENOSCOPY N/A 04/17/2021   Procedure: ESOPHAGOGASTRODUODENOSCOPY (EGD);  Surgeon: Charna Elizabeth, MD;  Location: [facility] ENDOSCOPY;  Service: Endoscopy;  Laterality: N/A;  Rectal bleeding and anemia.   ESOPHAGOGASTRODUODENOSCOPY N/A 04/18/2021   Procedure: ESOPHAGOGASTRODUODENOSCOPY (EGD);  Surgeon: Jeani Hawking, MD;  Location: [facility] ENDOSCOPY;  Service: Endoscopy;  Laterality: N/A;   HOT HEMOSTASIS N/A 04/18/2021   Procedure: HOT HEMOSTASIS (ARGON PLASMA COAGULATION/BICAP);  Surgeon: Jeani Hawking,  MD;  Location: [facility] ENDOSCOPY;  Service: Endoscopy;  Laterality: N/A;   IR ANGIOGRAM SELECTIVE EACH ADDITIONAL VESSEL  04/17/2021   IR ANGIOGRAM VISCERAL SELECTIVE  04/17/2021   IR EMBO ART  VEN HEMORR LYMPH EXTRAV  INC GUIDE ROADMAPPING  04/17/2021   IR US GUIDE VASC ACCESS RIGHT  04/17/2021   RADIOLOGY WITH ANESTHESIA N/A 04/17/2021   Procedure: RADIOLOGY WITH ANESTHESIA;  Surgeon: Radiologist, Medication, MD;  Location: [facility] OR;  Service: Radiology;  Laterality: N/A;   SUBMUCOSAL INJECTION  04/18/2021   Procedure: SUBMUCOSAL INJECTION;  Surgeon: Jeani Hawking, MD;  Location: [facility] ENDOSCOPY;  Service: Endoscopy;;     Current Outpatient Medications:    Artificial Tear Ointment (DRY EYES OP), Apply 1 drop to eye 2 (two) times daily as needed (dry eyes)., Disp: , Rfl:    baclofen (LIORESAL) 20 MG tablet, Take 1 tablet (20 mg total) by mouth 2 (two) times daily as needed for muscle spasms., Disp: 90 tablet, Rfl: 1   busPIRone (BUSPAR) 10 MG tablet, Take 1 tablet (10 mg total) by mouth 3 (three) times daily., Disp: 270 tablet, Rfl: 1   ciprofloxacin (CIPRO) 500 MG tablet, Take 1 tablet (500 mg total) by mouth 2 (two) times daily., Disp: 20 tablet, Rfl: 0   DULoxetine (CYMBALTA) 60 MG capsule, Take 1 capsule (60 mg total) by mouth daily., Disp: 90 capsule,  Rfl: 3   enalapril (VASOTEC) 10 MG tablet, Take 1 tablet (10 mg total) by mouth daily., Disp: 90 tablet, Rfl: 3   estradiol (ESTRACE) 0.1 MG/GM vaginal cream, Place 0.5g nightly for two weeks then twice a week after, Disp: 30 g, Rfl: 11   famotidine (PEPCID) 40 MG tablet, Take 1 tablet (40 mg total) by mouth daily., Disp: 90 tablet, Rfl: 3   fexofenadine (ALLEGRA) 180 MG tablet, Take 1 tablet (180 mg total) by mouth daily., Disp: 30 tablet, Rfl: 6   glucose blood (ONETOUCH VERIO) test strip, USE  STRIP TO CHECK GLUCOSE TWICE DAILY AS DIRECTED. E11.9, Disp: 100 each, Rfl: 3   Lancets (ONETOUCH DELICA PLUS LANCET33G) MISC, USE TO CHECK GLUCOSE IN THE MORNING AND AT  BEDTIME. E11.9, Disp: 100 each, Rfl: 3   lidocaine (XYLOCAINE) 5 % ointment, Apply to affected area 3 to 4 times daily, not to exceed 5 g of ointment in single application., Disp: 50 g, Rfl: 2   metoprolol succinate (TOPROL-XL) 25 MG 24 hr tablet, Take 1 tablet (25 mg total) by mouth daily., Disp: 90 tablet, Rfl: 3   mometasone (NASONEX) 50 MCG/ACT nasal spray, Place 2 sprays into the nose 2 (two) times daily. (Patient taking differently: Place 2 sprays into the nose 2 (two) times daily as needed.), Disp: 1 each, Rfl: 12   Multiple Vitamin (MULTIVITAMIN WITH MINERALS) TABS tablet, Take 1 tablet by mouth daily., Disp: , Rfl:    Multiple Vitamins-Minerals (ICAPS AREDS 2 PO), Take 1 tablet by mouth in the morning and at bedtime., Disp: , Rfl:    ondansetron (ZOFRAN-ODT) 4 MG disintegrating tablet, Take 1 tablet (4 mg total) by mouth every 8 (eight) hours as needed for nausea or vomiting., Disp: 12 tablet, Rfl: 0   oxyCODONE-acetaminophen (PERCOCET/ROXICET) 5-325 MG tablet, Take 0.5 tablets by mouth every 6 (six) hours as needed for severe pain (pain score 7-10)., Disp: 60 tablet, Rfl: 0   pantoprazole (PROTONIX) 40 MG tablet, Take 1 tablet (40 mg total) by mouth daily., Disp: 90 tablet, Rfl: 3   pregabalin (LYRICA) 50 MG capsule, Take 1 capsule (50 mg total) by mouth at bedtime as needed., Disp: 90 capsule, Rfl: 1   rosuvastatin (CRESTOR) 20 MG tablet, Take 1 tablet (20 mg total) by mouth daily., Disp: 90 tablet, Rfl: 3   Semaglutide, 1 MG/DOSE, (OZEMPIC, 1 MG/DOSE,) 2 MG/1.5ML SOPN, Inject 2 mg into the skin once a week. (Patient taking differently: Inject 1 mg into the skin every Sunday.), Disp: , Rfl:    triamcinolone ointment (KENALOG) 0.5 %, Apply 1 application topically 2 (two) times daily. For elbows (Patient taking differently: Apply 1 application  topically 2 (two) times daily as needed (elbow itching).), Disp: 30 g, Rfl: 2  Allergies  Allergen Reactions   Amoxicillin Rash   Azithromycin Rash     Itching/rash   Butalbital Other (See Comments)    Reaction: trouble breathing, but uncertain   Clindamycin Hcl Rash   Januvia [Sitagliptin] Other (See Comments)    Worsened migraines.   Levaquin [Levofloxacin In D5w] Diarrhea   Meperidine Hcl Other (See Comments)    REACTION: itching and hallucinations   Sulfamethoxazole Rash   Fexofenadine Hcl Other (See Comments)    Lips pealing    Ketorolac Swelling    Received ketorolac, methocarbamal, and lidocaine patch all at the same time.  Lip swelling 12 hours later   Macrodantin [Nitrofurantoin Macrocrystal] Itching   Methocarbamol Swelling    Received ketorolac, methocarbamal, and  lidocaine patch all at the same time.  Lip swelling 12 hours later   Nitrofurantoin Itching   Pioglitazone Other (See Comments)    Severe back pain and diffuse itching   Tramadol    Codeine Phosphate Other (See Comments)    REACTION: unknown   Etodolac Other (See Comments)    REACTION: stomach upset   Gabapentin Other (See Comments)    REACTION: mental clouding  Does not want to try again even in low dose.   Naproxen Other (See Comments)    REACTION: stomach upset   Pentazocine-Naloxone Hcl Other (See Comments)    REACTION: unspecified   Sumatriptan Other (See Comments)    REACTION: chest pain   Trimethoprim Palpitations    SOB, tongue swelling   Zonegran Other (See Comments)    Reaction: Unknown          Objective:  Physical Exam  General: AAO x3, NAD  Dermatological: Nails are hypertrophic, dystrophic, brittle, discolored, elongated 10. No surrounding redness or drainage. Tenderness nails 1-5 bilaterally.  Punctate annular hyperkeratotic lesion on the right heel.  Upon debridement there is no underlying ulceration, drainage or signs of infection.   Vascular: Dorsalis Pedis artery and Posterior Tibial artery pedal pulses are 2/4 bilateral with immedate capillary fill time. There is no pain with calf compression, swelling, warmth, erythema.    Neruologic: Grossly intact via light touch bilateral.  Musculoskeletal: Tenderness with skin lesion and nails no other areas of discomfort.     Assessment:   Skin lesion right foot, symptomatic onychomycosis     Plan:  Skin lesion right foot -Sharply debrided without any complications or bleeding.  Cleaned the skin with alcohol.  Cantharone was applied followed by an occlusive bandage.  Postprocedure instructions discussed.  Monitoring signs or symptoms of infection.  Symptomatic onychosis -Sharply debrided nails x 10 without any complications or bleeding.  Recommended routine debridement  Type 2 diabetes -Daily foot inspection -Glucose control

## 2023-09-14 NOTE — Patient Instructions (Signed)
 Take dressing off in 8 hours and wash the foot with soap and water. If it is hurting or becomes uncomfortable before the 8 hours, go ahead and remove the bandage and wash the area.  If it blisters, apply antibiotic ointment and a band-aid.  Monitor for any signs/symptoms of infection. Call the office immediately if any occur or go directly to the emergency room. Call with any questions/concerns.  --   Choose a moisturizer from  the list below:  For normal skin: Moisturize feet once daily; do not apply between toes A.  CeraVe Daily Moisturizing Lotion B.  Lubriderm Advanced Therapy Lotion or Lubriderm Intense Skin Repair Lotion C.  Aquaphor Intensive Repair Lotion D.  Gold Bond Ultimate Diabetic Foot Lotion E.  Eucerin Intensive Repair Moisturizing Lotion  For extremely dry, cracked feet: moisturize feet once daily; do not apply between toes A. CeraVe Healing Ointment B. Eucerin Aquaphor Repairing Ointment (may be labeled Aquaphor Healing Ointment) C. Vaseline Petroleum Healing Jelly   If you have problems reaching your feet: apply to feet once daily; do not apply between toes A.  Eucerin Aquaphor Ointment Body Spray  B.  Vaseline Intensive Care Spray Moisturizer (Unscented,  Cocoa Radiant Spray or Aloe Smooth Spray)

## 2023-09-23 ENCOUNTER — Ambulatory Visit (HOSPITAL_COMMUNITY)
Admission: RE | Admit: 2023-09-23 | Discharge: 2023-09-23 | Disposition: A | Discharge status: Home / Self Care | Payer: PPO | Source: Ambulatory Visit | Attending: Physician Assistant | Admitting: Physician Assistant

## 2023-09-23 ENCOUNTER — Telehealth: Payer: Self-pay | Admitting: Physician Assistant

## 2023-09-23 DIAGNOSIS — M79662 Pain in left lower leg: Secondary | ICD-10-CM

## 2023-09-23 DIAGNOSIS — M7989 Other specified soft tissue disorders: Secondary | ICD-10-CM | POA: Diagnosis not present

## 2023-09-23 NOTE — Telephone Encounter (Signed)
[name] w/ [facility] called with report U/S is negative for DVT. Completed today, they sent patient home.

## 2023-09-24 ENCOUNTER — Other Ambulatory Visit: Payer: Self-pay

## 2023-09-24 DIAGNOSIS — G8929 Other chronic pain: Secondary | ICD-10-CM

## 2023-09-24 DIAGNOSIS — M159 Polyosteoarthritis, unspecified: Secondary | ICD-10-CM

## 2023-09-24 NOTE — Telephone Encounter (Signed)
Patient returns call to nurse line. Advised of refill policy.   She just wants to make sure that she will be able to pick up medication prior to the weekend.   Veronda Prude, RN

## 2023-09-25 ENCOUNTER — Other Ambulatory Visit: Payer: Self-pay | Admitting: Family Medicine

## 2023-09-25 DIAGNOSIS — M159 Polyosteoarthritis, unspecified: Secondary | ICD-10-CM

## 2023-09-25 DIAGNOSIS — G8929 Other chronic pain: Secondary | ICD-10-CM

## 2023-09-25 MED ORDER — OXYCODONE-ACETAMINOPHEN 5-325 MG PO TABS: 0.5000 | ORAL_TABLET | Freq: Four times a day (QID) | ORAL | 0 refills | Status: DC | PRN

## 2023-09-25 NOTE — Telephone Encounter (Signed)
Patient has LVM x3 times regarding pain medication refill.   She is very worried as she is completely out of pain medication and "does not want to suffer through the weekend."   Patient has been advised multiple times that we have sent message to provider and that we will let her know once medication has been sent in.   Paged provider at 1640 regarding refill.   Received returned call from provider. Dr. Linwood Dibbles advised that she would send in medication this evening.   Called [facility] and advised of update.   Veronda Prude, RN

## 2023-09-25 NOTE — Telephone Encounter (Signed)
Patient has called multiple times in regards to medication refill.

## 2023-09-29 ENCOUNTER — Ambulatory Visit
Admission: RE | Admit: 2023-09-29 | Discharge: 2023-09-29 | Disposition: A | Discharge status: Home / Self Care | Payer: PPO | Source: Ambulatory Visit | Attending: Orthopedic Surgery | Admitting: Orthopedic Surgery

## 2023-09-29 DIAGNOSIS — M792 Neuralgia and neuritis, unspecified: Secondary | ICD-10-CM

## 2023-10-05 ENCOUNTER — Encounter: Payer: Self-pay | Admitting: Physician Assistant

## 2023-10-05 ENCOUNTER — Telehealth: Payer: Self-pay | Admitting: Radiology

## 2023-10-05 ENCOUNTER — Ambulatory Visit: Payer: PPO | Admitting: Physician Assistant

## 2023-10-05 DIAGNOSIS — M1712 Unilateral primary osteoarthritis, left knee: Secondary | ICD-10-CM | POA: Diagnosis not present

## 2023-10-05 NOTE — Telephone Encounter (Signed)
 Patient came in to see Bronson Curb today about left knee.   Patient was confused if needed to see Dr. August Saucer about neck MRI, which was ordered by him or follow up with Dr. Danielle Dess who is also seeing her for her low back.  Please contact patient to confirm and schedule appointment if needed. Thank you.

## 2023-10-05 NOTE — Progress Notes (Signed)
 HPI: Mrs. Maiden comes in today due to left knee pain and swelling.  She is using a rollator to ambulate.  She has tried cortisone and gel injections in the knee without significant relief.  Actual cortisone injections in the past during her glucose levels up to over 400.  She states she spoke with her primary care physician about this and is suggested that she have insulin available after injections so she can cover her glucose levels.  She has had no new knee injury.  Main pain at this point in time is radicular symptoms down the left leg but she is due to undergo epidural steroid injections by Dr. Verlee Rossetti office.Prior AP lateral views left knee: Knee is well located.  Bone-on-bone lateral compartment moderate narrowing medial compartment.  Severe patellofemoral changes with large osteophytes.  There is a large anvil spur off the tibia.   Physical exam: General Well-developed well-nourished pleasant female in no acute distress.  Psych: Alert and oriented x 3  Left knee full extension full flexion patellofemoral crepitus.  No instability valgus varus stressing.  Tenderness along lateral joint line.  No abnormal warmth erythema or effusion.   Impression: Left knee osteoarthritis tricompartmental  Plan: Recommend she work on strengthening.  She can use Voltaren gel on the knee out grams 4 times daily.  Shea Evans guards to her knee pain at this point in time this does not seem to be a primary pain.  Will see how she does after injections with Dr. Danielle Dess for the back and radicular symptoms.  She can follow-up with [place] as needed.  Questions encouraged and answered at length.

## 2023-10-05 NOTE — Telephone Encounter (Signed)
 For predominant scapular pain which is what she has I think she should see Dr. Danielle Dess.  She definitely has some changes at C5-6 which could be giving her her shoulder symptoms.  I do not think this is really coming from the shoulder itself in terms of needing a shoulder replacement.  She was most concerned about her scapular pain and I would want to have Dr. Danielle Dess weigh in on that before proceeding with replacement.  Please send this back to me and I will call her.  Thanks

## 2023-10-06 ENCOUNTER — Encounter: Payer: PPO | Attending: Physical Medicine and Rehabilitation | Admitting: Physical Medicine and Rehabilitation

## 2023-10-06 ENCOUNTER — Encounter: Payer: Self-pay | Admitting: Physical Medicine and Rehabilitation

## 2023-10-06 VITALS — BP 158/88 | HR 82 | Ht 59.0 in

## 2023-10-06 DIAGNOSIS — M159 Polyosteoarthritis, unspecified: Secondary | ICD-10-CM | POA: Insufficient documentation

## 2023-10-06 DIAGNOSIS — Z5181 Encounter for therapeutic drug level monitoring: Secondary | ICD-10-CM | POA: Diagnosis not present

## 2023-10-06 DIAGNOSIS — G894 Chronic pain syndrome: Secondary | ICD-10-CM | POA: Diagnosis not present

## 2023-10-06 DIAGNOSIS — M546 Pain in thoracic spine: Secondary | ICD-10-CM | POA: Insufficient documentation

## 2023-10-06 DIAGNOSIS — G8929 Other chronic pain: Secondary | ICD-10-CM | POA: Diagnosis not present

## 2023-10-06 DIAGNOSIS — Z79891 Long term (current) use of opiate analgesic: Secondary | ICD-10-CM | POA: Diagnosis not present

## 2023-10-06 MED ORDER — OXYCODONE-ACETAMINOPHEN 5-325 MG PO TABS: 1.0000 | ORAL_TABLET | ORAL | 0 refills | Status: DC | PRN

## 2023-10-06 MED ORDER — OXYCODONE HCL 5 MG PO TABS
5.0000 mg | ORAL_TABLET | ORAL | 0 refills | Status: DC | PRN
Start: 2023-10-06 — End: 2023-10-06

## 2023-10-06 NOTE — Addendum Note (Signed)
 Addended by: Horton Chin on: 10/06/2023 01:28 PM   Modules accepted: Orders

## 2023-10-06 NOTE — Addendum Note (Signed)
 Addended by: Horton Chin on: 10/06/2023 01:09 PM   Modules accepted: Orders

## 2023-10-06 NOTE — Progress Notes (Addendum)
 Subjective:    Patient ID: [name], female    DOB: 1940-12-25, 83 y.o.   MRN: [id number]  HPI   1) Thoracic myofascial pain [name] is an 83 year old woman who presents to establish care for right sided thoracic muscle pain and bilateral lumbar facet arthritis.  -heat does not help -she was having benefit from oxycodone but needs dose to be increased- the oxycodone provides several hours of relief -she is taking 1/2 a pill every 6 hours and she would like this changed every 4 hours -sometimes if will move higher or lower -she has been using three goats cream -she has swelling in response to methocarbamol, but chart indicates that this was due to receiving multiple new medications at the same time, did not find benefit from this or from baclofen or flexeril. Her son feels she took too many of these yesterday and was confused as a result.  -applied some lidocaine cream in office last visit and this helped.  -she says the pain comes and goes, last time I saw her was a good day.  -her last thoracic spine MRI was in 2018 and showed spondylosis -she did have a fall  -called today to discuss results of thoracic spine XR -pain has been pretty bad.  -she had an appointment with Dr. [name] today and some of her medications were changed. She is to f/u with him again in 2 weeks.  -she stayed by herself last night and thinks she is on the mend.   2) Bilateral lumbar facet arthrosis: -she has low back pain for a long time -is worse with standing and walking  -she is unable to do therapy as she is currently doing therapy for incontinence.  -was unable to get injection with Dr. Wynn Banker due to elevated temperature and has been rescheduled -she denies loss of bowel or bladder function -she denies radiation of pain into her legs   Pain Inventory Average Pain 10 Pain Right Now 8 My pain is constant, sharp, burning, dull, stabbing, tingling, and aching  In the last 24 hours, has pain  interfered with the following? General activity 5 Relation with others 5 Enjoyment of life 5 What TIME of day is your pain at its worst? varies Sleep (in general) Fair  Pain is worse with: unsure Pain improves with: heat/ice, medication, and lying on the floor Relief from Meds: 5     Family History  Problem Relation Age of Onset   Hypertension Mother    Hypertension Father    Hearing loss Sister    Heart attack Sister 79       s/p 4 bypasses    Stroke Brother        38   Breast cancer Maternal Aunt    Ovarian cancer Maternal Aunt    Social History   Socioeconomic History   Marital status: Widowed    Spouse name: Not on file   Number of children: 2   Years of education: Not on file   Highest education level: Not on file  Occupational History   Occupation: Retired  Tobacco Use   Smoking status: Former    Current packs/day: 0.00    Average packs/day: 2.0 packs/day for 33.6 years (67.3 ttl pk-yrs)    Types: Cigarettes    Start date: 08/11/1957    Quit date: 04/04/1991    Years since quitting: 32.5   Smokeless tobacco: Never  Vaping Use   Vaping status: Never Used  Substance and Sexual  Activity   Alcohol use: No   Drug use: No   Sexual activity: Not Currently  Other Topics Concern   Not on file  Social History Narrative   Not on file   Social Drivers of Health   Financial Resource Strain: Low Risk  (11/18/2022)   Overall Financial Resource Strain (CARDIA)    Difficulty of Paying Living Expenses: Not hard at all  Food Insecurity: No Food Insecurity (11/18/2022)   Hunger Vital Sign    Worried About Running Out of Food in the Last Year: Never true    Ran Out of Food in the Last Year: Never true  Transportation Needs: No Transportation Needs (07/13/2023)   PRAPARE - Administrator, Civil Service (Medical): No    Lack of Transportation (Non-Medical): No  Physical Activity: Inactive (11/18/2022)   Exercise Vital Sign    Days of Exercise per Week: 0 days     Minutes of Exercise per Session: 0 min  Stress: No Stress Concern Present (11/18/2022)   [employer] of Occupational Health - Occupational Stress Questionnaire    Feeling of Stress : Not at all  Social Connections: Socially Isolated (11/18/2022)   Social Connection and Isolation Panel [NHANES]    Frequency of Communication with Friends and Family: More than three times a week    Frequency of Social Gatherings with Friends and Family: Twice a week    Attends Religious Services: Never    Database administrator or Organizations: No    Attends Banker Meetings: Never    Marital Status: Widowed   Past Surgical History:  Procedure Laterality Date   ABDOMINAL HYSTERECTOMY     ?Lt. ovary remains   APPENDECTOMY     Arm surgery     BREAST BIOPSY Right 10/10/2009   BREAST BIOPSY Right 09/17/2022   MM RT BREAST BX W LOC DEV 1ST LESION IMAGE BX SPEC STEREO GUIDE 09/17/2022 [facility]   BREAST CYST ASPIRATION  12/20/2013   BREAST EXCISIONAL BIOPSY Left 1998   BREAST EXCISIONAL BIOPSY Right 1993   ESOPHAGOGASTRODUODENOSCOPY N/A 04/17/2021   Procedure: ESOPHAGOGASTRODUODENOSCOPY (EGD);  Surgeon: Charna Elizabeth, MD;  Location: [facility] ENDOSCOPY;  Service: Endoscopy;  Laterality: N/A;  Rectal bleeding and anemia.   ESOPHAGOGASTRODUODENOSCOPY N/A 04/18/2021   Procedure: ESOPHAGOGASTRODUODENOSCOPY (EGD);  Surgeon: Jeani Hawking, MD;  Location: [facility] ENDOSCOPY;  Service: Endoscopy;  Laterality: N/A;   HOT HEMOSTASIS N/A 04/18/2021   Procedure: HOT HEMOSTASIS (ARGON PLASMA COAGULATION/BICAP);  Surgeon: Jeani Hawking, MD;  Location: [facility] ENDOSCOPY;  Service: Endoscopy;  Laterality: N/A;   IR ANGIOGRAM SELECTIVE EACH ADDITIONAL VESSEL  04/17/2021   IR ANGIOGRAM VISCERAL SELECTIVE  04/17/2021   IR EMBO ART  VEN HEMORR LYMPH EXTRAV  INC GUIDE ROADMAPPING  04/17/2021   IR US GUIDE VASC ACCESS RIGHT  04/17/2021   RADIOLOGY WITH ANESTHESIA N/A 04/17/2021   Procedure: RADIOLOGY WITH ANESTHESIA;  Surgeon: Radiologist,  Medication, MD;  Location: [facility] OR;  Service: Radiology;  Laterality: N/A;   SUBMUCOSAL INJECTION  04/18/2021   Procedure: SUBMUCOSAL INJECTION;  Surgeon: Jeani Hawking, MD;  Location: [facility] ENDOSCOPY;  Service: Endoscopy;;   Past Medical History:  Diagnosis Date   Asthma    Depression    Diabetes mellitus without complication (HCC)    History of surgery on arm    right arm ( plate and screws)   Hypertension    Macular degeneration    Migraine    without aura   Pneumonia    Urinary incontinence  BP (!) 158/88   Pulse 82   Ht 4\' 11"  (1.499 m)   SpO2 96%   BMI 33.41 kg/m   Opioid Risk Score:   Fall Risk Score:  `1  Depression screen [facility] 2/9     10/06/2023   11:26 AM 08/28/2023   11:05 AM 06/11/2023   11:17 AM 05/22/2023    9:02 AM 05/22/2023    9:00 AM 04/08/2023   10:38 AM 12/22/2022   11:30 AM  Depression screen PHQ 2/9  Decreased Interest 0 0 1 0 0 0 0  Down, Depressed, Hopeless 0 0 1 0 0 1 0  PHQ - 2 Score 0 0 2 0 0 1 0  Altered sleeping  0 0 1 1 1  0  Tired, decreased energy  0 1 1 1 1  0  Change in appetite  0 1 0 0 0 0  Feeling bad or failure about yourself   0 0   0 0  Trouble concentrating  0 1   0 0  Moving slowly or fidgety/restless  0 1   0 0  Suicidal thoughts  0 0   0 0  PHQ-9 Score  0 6 2 2 3  0  Difficult doing work/chores   Very difficult        Review of Systems  Musculoskeletal:  Positive for back pain and gait problem.       Spasms  All other systems reviewed and are negative.      Objective:   Physical Exam Gen: no distress, normal appearing HEENT: oral mucosa pink and moist, NCAT Cardio: Reg rate Chest: normal effort, normal rate of breathing Abd: soft, non-distended Ext: no edema Psych: pleasant, normal affect Skin: intact Neuro: Alert and oriented x3 Musculoskeletal: Pain reproduced with lumbar extension and standing, tenderness to palpation over bilateral lower lumbar paraspinals as well as right sided thoracic paraspinal. Negative  slump test bilaterally. Antalgic gait, required wheelchair transport due to severity of thoracic paraspinal. Stable  Assessment & Plan:  [name] is a 83 year old woman who presents to establish care for the following conditions:  1) right sided thoracic muscle spasm: -d/c robaxin since not helping. -increase percocet to 1 tab q4 hours prn, 5mg  -recommend applying blue emu oil -thoracic spine XR ordered to assess for compression fracture.  -increase mobic to 7.5mg  BID, discussed side effects -prescribed lidocaine patch -MRI thoracic spine reviewed with patient and son (from 2021): shows spondylosis -discussed that it is always good to use the least amount of pain medication that she needs -discussed that she has  -continue balcofen 10mg  TID but take only at night if sedated -discussed results of thoracic spine XR -trigger point injection to right upper back next visit with me.  -discussed that Percocet has been helping -will plan for trigger point injections next visit  2) Lumbar spinal stenosis -discussed that she is getting steroid injections by Dr. Danielle Dess  3) constipation: -discussed that she gets this in response to oxycodone -discussed benefits of celery juice

## 2023-10-07 NOTE — Telephone Encounter (Signed)
 Ok thx.

## 2023-10-08 LAB — TOXASSURE SELECT,+ANTIDEPR,UR

## 2023-10-12 ENCOUNTER — Telehealth: Payer: Self-pay | Admitting: *Deleted

## 2023-10-12 NOTE — Telephone Encounter (Signed)
 [name] called to say that the oxycodone 5/325 q 4 hours is not helping. She would like a call back. She is still in pain.

## 2023-10-13 ENCOUNTER — Encounter: Attending: Physical Medicine and Rehabilitation | Admitting: Physical Medicine and Rehabilitation

## 2023-10-13 DIAGNOSIS — M546 Pain in thoracic spine: Secondary | ICD-10-CM

## 2023-10-13 DIAGNOSIS — G8929 Other chronic pain: Secondary | ICD-10-CM | POA: Diagnosis not present

## 2023-10-13 MED ORDER — OXYCODONE-ACETAMINOPHEN 10-325 MG PO TABS: 1.0000 | ORAL_TABLET | ORAL | 0 refills | Status: DC | PRN

## 2023-10-13 NOTE — Progress Notes (Signed)
 Subjective:    Patient ID: [name], female    DOB: Jan 07, 1941, 83 y.o.   MRN: [id number]  HPI  An audio/video tele-health visit is felt to be the most appropriate encounter for this patient at this time. This is a follow up tele-visit via phone. The patient is at home. MD is at office. Prior to scheduling this appointment, our staff discussed the limitations of evaluation and management by telemedicine and the availability of in-person appointments. The patient expressed understanding and agreed to proceed.   1) Thoracic myofascial pain [name] is a 83 year old woman who presents to establish care for right sided thoracic muscle pain and bilateral lumbar facet arthritis.  -heat does not help -pain is unbelievable -had an MRI in 2021 -she was having benefit from oxycodone but needs dose to be increased- the oxycodone provides several hours of relief -she is taking 1/2 a pill every 6 hours and she would like this changed every 4 hours -sometimes if will move higher or lower -she has been using three goats cream -she has swelling in response to methocarbamol, but chart indicates that this was due to receiving multiple new medications at the same time, did not find benefit from this or from baclofen or flexeril. Her son feels she took too many of these yesterday and was confused as a result.  -applied some lidocaine cream in office last visit and this helped.  -she says the pain comes and goes, last time I saw her was a good day.  -her last thoracic spine MRI was in 2018 and showed spondylosis -she did have a fall  -called today to discuss results of thoracic spine XR -pain has been pretty bad.  -she had an appointment with Dr. Leveda Grant today and some of her medications were changed. She is to f/u with him again in 2 weeks.  -she stayed by herself last night and thinks she is on the mend.   2) Bilateral lumbar facet arthrosis: -she has low back pain for a long time -is worse with  standing and walking  -she is unable to do therapy as she is currently doing therapy for incontinence.  -was unable to get injection with Dr. Wynn Grant due to elevated temperature and has been rescheduled -she denies loss of bowel or bladder function -she denies radiation of pain into her legs   Pain Inventory Average Pain 10 Pain Right Now 8 My pain is constant, sharp, burning, dull, stabbing, tingling, and aching  In the last 24 hours, has pain interfered with the following? General activity 5 Relation with others 5 Enjoyment of life 5 What TIME of day is your pain at its worst? varies Sleep (in general) Fair  Pain is worse with: unsure Pain improves with: heat/ice, medication, and lying on the floor Relief from Meds: 5     Family History  Problem Relation Age of Onset   Hypertension Mother    Hypertension Father    Hearing loss Sister    Heart attack Sister 81       s/p 4 bypasses    Stroke Brother        68   Breast cancer Maternal Aunt    Ovarian cancer Maternal Aunt    Social History   Socioeconomic History   Marital status: Widowed    Spouse name: Not on file   Number of children: 2   Years of education: Not on file   Highest education level: Not on file  Occupational  History   Occupation: Retired  Tobacco Use   Smoking status: Former    Current packs/day: 0.00    Average packs/day: 2.0 packs/day for 33.6 years (67.3 ttl pk-yrs)    Types: Cigarettes    Start date: 08/11/1957    Quit date: 04/04/1991    Years since quitting: 32.5   Smokeless tobacco: Never  Vaping Use   Vaping status: Never Used  Substance and Sexual Activity   Alcohol use: No   Drug use: No   Sexual activity: Not Currently  Other Topics Concern   Not on file  Social History Narrative   Not on file   Social Drivers of Health   Financial Resource Strain: Low Risk  (11/18/2022)   Overall Financial Resource Strain (CARDIA)    Difficulty of Paying Living Expenses: Not hard at all   Food Insecurity: No Food Insecurity (11/18/2022)   Hunger Vital Sign    Worried About Running Out of Food in the Last Year: Never true    Ran Out of Food in the Last Year: Never true  Transportation Needs: No Transportation Needs (07/13/2023)   PRAPARE - Administrator, Civil Service (Medical): No    Lack of Transportation (Non-Medical): No  Physical Activity: Inactive (11/18/2022)   Exercise Vital Sign    Days of Exercise per Week: 0 days    Minutes of Exercise per Session: 0 min  Stress: No Stress Concern Present (11/18/2022)   [employer] of Occupational Health - Occupational Stress Questionnaire    Feeling of Stress : Not at all  Social Connections: Socially Isolated (11/18/2022)   Social Connection and Isolation Panel [NHANES]    Frequency of Communication with Friends and Family: More than three times a week    Frequency of Social Gatherings with Friends and Family: Twice a week    Attends Religious Services: Never    Database administrator or Organizations: No    Attends Grant Meetings: Never    Marital Status: Widowed   Past Surgical History:  Procedure Laterality Date   ABDOMINAL HYSTERECTOMY     ?Lt. ovary remains   APPENDECTOMY     Arm surgery     BREAST BIOPSY Right 10/10/2009   BREAST BIOPSY Right 09/17/2022   MM RT BREAST BX W LOC DEV 1ST LESION IMAGE BX SPEC STEREO GUIDE 09/17/2022 [facility]MO[facility]   BREAST CYST ASPIRATION  12/20/2013   BREAST EXCISIONAL BIOPSY Left 1998   BREAST EXCISIONAL BIOPSY Right 1993   ESOPHAGOGASTRODUODENOSCOPY N/A 04/17/2021   Procedure: ESOPHAGOGASTRODUODENOSCOPY (EGD);  Surgeon: Debra Elizabeth, MD;  Location: [facility] ENDOSCOPY;  Service: Endoscopy;  Laterality: N/A;  Rectal bleeding and anemia.   ESOPHAGOGASTRODUODENOSCOPY N/A 04/18/2021   Procedure: ESOPHAGOGASTRODUODENOSCOPY (EGD);  Surgeon: Debra Hawking, MD;  Location: [facility] ENDOSCOPY;  Service: Endoscopy;  Laterality: N/A;   HOT HEMOSTASIS N/A 04/18/2021   Procedure: HOT  HEMOSTASIS (ARGON PLASMA COAGULATION/BICAP);  Surgeon: Debra Hawking, MD;  Location: [facility] ENDOSCOPY;  Service: Endoscopy;  Laterality: N/A;   IR ANGIOGRAM SELECTIVE EACH ADDITIONAL VESSEL  04/17/2021   IR ANGIOGRAM VISCERAL SELECTIVE  04/17/2021   IR EMBO ART  VEN HEMORR LYMPH EXTRAV  INC GUIDE ROADMAPPING  04/17/2021   IR US GUIDE VASC ACCESS RIGHT  04/17/2021   RADIOLOGY WITH ANESTHESIA N/A 04/17/2021   Procedure: RADIOLOGY WITH ANESTHESIA;  Surgeon: Radiologist, Medication, MD;  Location: [facility] OR;  Service: Radiology;  Laterality: N/A;   SUBMUCOSAL INJECTION  04/18/2021   Procedure: SUBMUCOSAL INJECTION;  Surgeon: Elnoria Howard,  Luisa Hart, MD;  Location: [facility] ENDOSCOPY;  Service: Endoscopy;;   Past Medical History:  Diagnosis Date   Asthma    Depression    Diabetes mellitus without complication (HCC)    History of surgery on arm    right arm ( plate and screws)   Hypertension    Macular degeneration    Migraine    without aura   Pneumonia    Urinary incontinence    There were no vitals taken for this visit.  Opioid Risk Score:   Fall Risk Score:  `1  Depression screen [facility] 2/9     10/06/2023   11:26 AM 08/28/2023   11:05 AM 06/11/2023   11:17 AM 05/22/2023    9:02 AM 05/22/2023    9:00 AM 04/08/2023   10:38 AM 12/22/2022   11:30 AM  Depression screen PHQ 2/9  Decreased Interest 0 0 1 0 0 0 0  Down, Depressed, Hopeless 0 0 1 0 0 1 0  PHQ - 2 Score 0 0 2 0 0 1 0  Altered sleeping  0 0 1 1 1  0  Tired, decreased energy  0 1 1 1 1  0  Change in appetite  0 1 0 0 0 0  Feeling bad or failure about yourself   0 0   0 0  Trouble concentrating  0 1   0 0  Moving slowly or fidgety/restless  0 1   0 0  Suicidal thoughts  0 0   0 0  PHQ-9 Score  0 6 2 2 3  0  Difficult doing work/chores   Very difficult        Review of Systems  Musculoskeletal:  Positive for back pain and gait problem.       Spasms  All other systems reviewed and are negative.      Objective:   Physical Exam PRIOR EXAM: Gen: no  distress, normal appearing HEENT: oral mucosa pink and moist, NCAT Cardio: Reg rate Chest: normal effort, normal rate of breathing Abd: soft, non-distended Ext: no edema Psych: pleasant, normal affect Skin: intact Neuro: Alert and oriented x3 Musculoskeletal: Pain reproduced with lumbar extension and standing, tenderness to palpation over bilateral lower lumbar paraspinals as well as right sided thoracic paraspinal. Negative slump test bilaterally. Antalgic gait, required wheelchair transport due to severity of thoracic paraspinal. Stable  Assessment & Plan:  Mrs. Vanwyk is a 83 year old woman who presents to establish care for the following conditions:  1) right sided thoracic muscle spasm: -d/c robaxin -MRI thoracic spine ordered -discussed that she can try taking a third baclofen if the pain is very severe -increase percocet to 10g q4H when refill is due, for now continue 5mg  q4H prn -recommend applying blue emu oil -thoracic spine XR ordered to assess for compression fracture.  -increase mobic to 7.5mg  BID, discussed side effects -prescribed lidocaine patch -MRI thoracic spine reviewed with patient and son (from 2021): shows spondylosis -discussed that it is always good to use the least amount of pain medication that she needs -discussed that she has  -continue balcofen 10mg  TID but take only at night if sedated -discussed results of thoracic spine XR -trigger point injection to right upper back next visit with me.  -discussed that Percocet has been helping -will plan for trigger point injections next visit -recommended taking Coenzyme Q10 while on statin  2) Lumbar spinal stenosis -discussed that she is getting steroid injections by Dr. Danielle Dess  3) constipation: -discussed that she gets  this in response to oxycodone -discussed benefits of celery juice  10 minutes spent in discussion of her pain, right sided thoracic muscle spasm, ordered MRI of thoracic spine, discussed  that prior MRI shows disc degeneration, discussed that we can increase percocet to 10mg  q4H prn when she is due for her next refill, recommended taking Coenzyme Q10 while taking statin as stating can deplete this in the body and cause myofascial pain

## 2023-10-14 ENCOUNTER — Telehealth: Payer: Self-pay | Admitting: Physical Medicine and Rehabilitation

## 2023-10-14 NOTE — Telephone Encounter (Signed)
 [facility] Lurena Joiner) needs a call back about pain medication.  Please call her at [phone].

## 2023-10-15 NOTE — Telephone Encounter (Signed)
 The Pharmacist will need to speak with Dr. Carlis Abbott about the pain medication scripts. There are few concerns including a need for Narcan. Dr. Carlis Abbott please call the [facility] back [phone].  Thank you

## 2023-10-15 NOTE — Telephone Encounter (Signed)
 Pharmacist will call you.

## 2023-10-16 ENCOUNTER — Telehealth: Payer: Self-pay | Admitting: Physical Medicine and Rehabilitation

## 2023-10-16 ENCOUNTER — Telehealth: Payer: Self-pay

## 2023-10-16 NOTE — Telephone Encounter (Signed)
 Patient calls nurse line requesting refill on insulin.   She is having a steroid shot on 10/19/23 and states that she has had to have insulin in the past due to her blood sugar levels.   Patient is also asking for directions on how she should be taking (in the AM or PM)  Will forward request to Dr. Linwood Dibbles.   Veronda Prude, RN

## 2023-10-16 NOTE — Telephone Encounter (Signed)
 Patient stated she was told to take Coenzyme Q10. She does not know what dosage you told her to take. She also wants to know the reason she is taking it.

## 2023-10-19 DIAGNOSIS — M5416 Radiculopathy, lumbar region: Secondary | ICD-10-CM | POA: Diagnosis not present

## 2023-10-19 DIAGNOSIS — M5116 Intervertebral disc disorders with radiculopathy, lumbar region: Secondary | ICD-10-CM | POA: Diagnosis not present

## 2023-10-19 NOTE — Telephone Encounter (Signed)
 Patient calls nurse line again in regards to insulin and directions.   She reports her steroid injection is today at 12:30pm.   She is unsure if she needs to inject prior to apt.   Will forward to PCP.

## 2023-10-19 NOTE — Telephone Encounter (Signed)
 Patient has been informed.

## 2023-10-19 NOTE — Telephone Encounter (Signed)
 Called patient and she states that the insulin that she was prescribed earlier today was too expensive for her to pick up. She says that the pharmacist told her that it was 100 dollars for one syringe.  Advised patient to call pharmacy as I did not see documentation of any medication having been prescribed  from earlier today by any of our doctors.  Will call patient in about and hour to see if she wants to make an appointment to discuss with PCP.  Of note patient did have her steroid injection today at 1330.  Last time she stated that her blood sugar went up to 400 and that she did not want that to happen again. This is why she is convinced that she needs insulin.  Glennie Hawk, CMA

## 2023-10-19 NOTE — Telephone Encounter (Signed)
 Called patient back to see if she wants to make an appointment and to see what med she is requesting.  Patient states that she needs the same insulin that she had been prescribed before.(Lantus solostar 100 MG)  She is convinced that her blood sugar levels are going to rise.  At the time of the call her reading was 162 and she had her injection was at 1330 today.    Her pain management doctor sent the wrong insulin to the pharmacy and she is requesting the right med from [facility].  Patient also wants to know how to take it as she originally wanted to take it prior to her steroid injection.  Glennie Hawk, CMA

## 2023-10-20 ENCOUNTER — Other Ambulatory Visit: Payer: Self-pay | Admitting: Family Medicine

## 2023-10-20 ENCOUNTER — Telehealth: Payer: Self-pay | Admitting: Physical Medicine and Rehabilitation

## 2023-10-20 DIAGNOSIS — E119 Type 2 diabetes mellitus without complications: Secondary | ICD-10-CM

## 2023-10-20 MED ORDER — INSULIN GLARGINE 100 UNIT/ML SOLOSTAR PEN: 12.0000 [IU] | PEN_INJECTOR | SUBCUTANEOUS | 0 refills | Status: DC

## 2023-10-20 NOTE — Telephone Encounter (Signed)
 Called patient and provided with directions.   Veronda Prude, RN

## 2023-10-20 NOTE — Telephone Encounter (Signed)
 Patient has been advised

## 2023-10-20 NOTE — Telephone Encounter (Signed)
 Patient LVM on nurse line multiple times over insulin.   Called and spoke with patient.   Advised she will need an office visit to discuss steroid and insulin.   Patient scheduled for 3/17.  Patient advised to write down various cbgs throughout the next week.   Discussed calling the clinic for high values and discussed precautions.

## 2023-10-20 NOTE — Telephone Encounter (Signed)
 Patient returns call to nurse line.   She reports checking blood sugar around 1240- 280, 1:22 pm- 311  She feels that she is sweating now and has a headache. She states that this is how she feels when her blood sugar is elevated.   No lightheadedness, dizziness , nausea or vomiting.   Spoke with Dr. Linwood Dibbles. She is going to send prescription for patient.   [facility].   ED precautions discussed.   Patient asks that I call her back with dosing directions once order is placed.   Veronda Prude, RN

## 2023-10-20 NOTE — Telephone Encounter (Signed)
 Patient wanting to know why was a medication for nose spray sent to her pharmacy.  She was told it would be $100 not covered by insurance.  Please call patient.

## 2023-10-20 NOTE — Telephone Encounter (Signed)
 Noted. Sent lantus 12u daily to preferred pharmacy. Dose 12 units daily until appointment on Monday. Hold lantus for morning blood sugars <100.

## 2023-10-21 ENCOUNTER — Telehealth: Payer: Self-pay

## 2023-10-21 NOTE — Telephone Encounter (Signed)
 Patient calls nurse line reporting a high glucose level.  She reports 190 fasting this morning when she woke up. She reports she immediately took her insulin (12 units) and ate some graham crackers.  She reports ~2 hours later around 9:30 her glucose was 293. She reports she is worried and doesn't know what to do. She is requesting to take more insulin.   She reports she has a headache, some nausea and fatigue.   She denies any dizziness, shortness of breath, chest pains, shakiness, sweating, vomiting, excessive thirst or urination.   Patient advised to stay well hydrated and to focus on lean proteins vs graham crackers in the morning. Patient advised to to not take any more insulin unless instructed by PCP.  ED precautions discussed.   Will forward to PCP.

## 2023-10-22 NOTE — Telephone Encounter (Signed)
 Called patient and informed of provider directions.   All questions answered.   Veronda Prude, RN

## 2023-10-22 NOTE — Telephone Encounter (Signed)
 Patient returns call to nurse line regarding blood sugar levels.   She reports that her blood sugar level this morning at 0615 was 415. She administered 12 units of Lantus and ate quiche and cottage cheese. Blood sugar at 0815 was 278, 1100- 178.  Patient is asking if she should increase her Lantus due to continued high morning readings.   Will forward to PCP for further advisement.   Veronda Prude, RN

## 2023-10-22 NOTE — Telephone Encounter (Signed)
 Patient calls nurse line again in regards to insulin.   She wanted clarification on the dose if fasting sugars are >250.  Discussed with patient.  All questions answered.

## 2023-10-23 ENCOUNTER — Telehealth: Payer: Self-pay

## 2023-10-23 NOTE — Telephone Encounter (Signed)
 Received call from patient and Cala Bradford, [facility] Health aide regarding patient.   She reports that patient has had elevated blood pressure and blood sugar levels during visit today.   BP: 183/118, 220/110.  Last CBG was 355.  Patient is also reporting dizziness.   Home health aide has called EMS due to symptoms.   Advised that I would recommend this as well and patient receiving evaluation in the ED.   Veronda Prude, RN

## 2023-10-23 NOTE — Progress Notes (Signed)
 Does she have fu with luke?

## 2023-10-23 NOTE — Telephone Encounter (Signed)
 Patient needs appointment to review scan

## 2023-10-23 NOTE — Telephone Encounter (Signed)
-----   Message from Burnard Bunting sent at 10/23/2023 11:51 AM EDT ----- Does she have f/u with luke?

## 2023-10-26 ENCOUNTER — Ambulatory Visit (INDEPENDENT_AMBULATORY_CARE_PROVIDER_SITE_OTHER): Admitting: Family Medicine

## 2023-10-26 ENCOUNTER — Encounter: Payer: Self-pay | Admitting: Family Medicine

## 2023-10-26 VITALS — BP 126/80 | HR 99 | Ht 59.0 in

## 2023-10-26 DIAGNOSIS — E118 Type 2 diabetes mellitus with unspecified complications: Secondary | ICD-10-CM | POA: Diagnosis not present

## 2023-10-26 NOTE — Patient Instructions (Addendum)
 It was great to see you!  Our plans for today:  - No changes to your medications. Continue to take your lantus as you have been. - You can get your pneumonia, shingles, and tetanus vaccines at your pharmacy.  - Come back in 1 month.  Take care and seek immediate care sooner if you develop any concerns.   Dr. Linwood Dibbles

## 2023-10-26 NOTE — Progress Notes (Signed)
   SUBJECTIVE:   CHIEF COMPLAINT / HPI:   Diabetes, Type 2 - 2 ESI last week. - Last A1c 5.8 08/2023 - Medications: ozempic, lantus 12u (started last week due to [facility] and higher CBGs) - Compliance: not taking ozempic 1mg  since December due to lows. Currently on 12u lantus - Checking BG at home: yes. Fasting 130-180s. After eating spikes to 250s. Highest 380.  - cottage cheese, walnuts, banana 345. Flavored greek yogurt with fruit.   - denies symptoms with higher CBGs - Diet: high protein, loves fruits and vegetables.  - Eye exam: UTD - Foot exam: UTD - Microalbumin: UTD - Statin: yes - PNA vaccine: due   OBJECTIVE:   BP 126/80   Pulse 99   Ht 4\' 11"  (1.499 m)   SpO2 98%   BMI 33.41 kg/m   Gen: well appearing, in NAD Card: Reg rate Lungs: Comfortable WOB on RA Ext: WWP   ASSESSMENT/PLAN:   Diabetes mellitus type 2, controlled, with complications (HCC) CBGs at goal with lantus, has not had to titrate up. Some higher CBGs with fruit intake but asymptomatic. No changes for now. F/u 1 month for recheck, obtain A1c at that time. Once sugars controlled after ESI effects wear off, can consider restarting ozempic at lower dose due to prior lows on previous dose.     Caro Laroche, DO

## 2023-10-26 NOTE — Assessment & Plan Note (Signed)
 CBGs at goal with lantus, has not had to titrate up. Some higher CBGs with fruit intake but asymptomatic. No changes for now. F/u 1 month for recheck, obtain A1c at that time. Once sugars controlled after ESI effects wear off, can consider restarting ozempic at lower dose due to prior lows on previous dose.

## 2023-10-29 ENCOUNTER — Ambulatory Visit: Payer: PPO

## 2023-10-29 VITALS — Ht 59.0 in | Wt 155.0 lb

## 2023-10-29 DIAGNOSIS — Z0000 Encounter for general adult medical examination without abnormal findings: Secondary | ICD-10-CM | POA: Diagnosis not present

## 2023-10-29 NOTE — Progress Notes (Signed)
 Subjective:   [name] is a 83 y.o. who presents for a Medicare Wellness preventive visit.  Visit Complete: Virtual I connected with  [name] on 10/29/23 by a audio enabled telemedicine application and verified that I am speaking with the correct person using two identifiers.  Patient Location: Home  Provider Location: Home Office  I discussed the limitations of evaluation and management by telemedicine. The patient expressed understanding and agreed to proceed.  Vital Signs: Because this visit was a virtual/telehealth visit, some criteria may be missing or patient reported. Any vitals not documented were not able to be obtained and vitals that have been documented are patient reported.  VideoDeclined- This patient declined Librarian, academic. Therefore the visit was completed with audio only.  Persons Participating in Visit: Patient.  AWV Questionnaire: No: Patient Medicare AWV questionnaire was not completed prior to this visit.  Cardiac Risk Factors include: advanced age (>27men, >65 women);diabetes mellitus;hypertension     Objective:    Today's Vitals   10/29/23 1436 10/29/23 1437  Weight: 155 lb (70.3 kg)   Height: 4\' 11"  (1.499 m)   PainSc:  0-No pain   Body mass index is 31.31 kg/m.     10/29/2023    2:48 PM 06/22/2023    5:01 PM 06/19/2023    7:09 PM 06/11/2023   11:15 AM 05/17/2023    7:32 PM 12/22/2022   11:30 AM 11/18/2022   11:09 AM  Advanced Directives  Does Patient Have a Medical Advance Directive? No No No No Yes No Yes  Type of Agricultural consultant;Living will  Healthcare Power of Attorney  Does patient want to make changes to medical advance directive?      No - Patient declined Yes (MAU/Ambulatory/Procedural Areas - Information given)  Would patient like information on creating a medical advance directive? No - Patient declined No - Patient declined No - Patient declined   No - Patient  declined     Current Medications (verified) Outpatient Encounter Medications as of 10/29/2023  Medication Sig   Artificial Tear Ointment (DRY EYES OP) Apply 1 drop to eye 2 (two) times daily as needed (dry eyes).   baclofen (LIORESAL) 20 MG tablet Take 1 tablet (20 mg total) by mouth 2 (two) times daily as needed for muscle spasms.   ciprofloxacin (CIPRO) 500 MG tablet Take 1 tablet (500 mg total) by mouth 2 (two) times daily.   DULoxetine (CYMBALTA) 60 MG capsule Take 1 capsule (60 mg total) by mouth daily.   enalapril (VASOTEC) 10 MG tablet Take 1 tablet (10 mg total) by mouth daily.   estradiol (ESTRACE) 0.1 MG/GM vaginal cream Place 0.5g nightly for two weeks then twice a week after   famotidine (PEPCID) 40 MG tablet Take 1 tablet (40 mg total) by mouth daily.   fexofenadine (ALLEGRA) 180 MG tablet Take 1 tablet (180 mg total) by mouth daily.   glucose blood (ONETOUCH VERIO) test strip USE 1 STRIP TO TEST 2 TIMES DAILY AS DIRECTED   insulin glargine (LANTUS) 100 UNIT/ML Solostar Pen Inject 12 Units into the skin every morning.   Lancets (ONETOUCH DELICA PLUS LANCET33G) MISC USE TO CHECK GLUCOSE IN THE MORNING AND AT BEDTIME. E11.9   lidocaine (XYLOCAINE) 5 % ointment Apply to affected area 3 to 4 times daily, not to exceed 5 g of ointment in single application.   metoprolol succinate (TOPROL-XL) 25 MG 24 hr tablet Take 1 tablet (25  mg total) by mouth daily.   mometasone (NASONEX) 50 MCG/ACT nasal spray Place 2 sprays into the nose 2 (two) times daily. (Patient taking differently: Place 2 sprays into the nose 2 (two) times daily as needed.)   Multiple Vitamin (MULTIVITAMIN WITH MINERALS) TABS tablet Take 1 tablet by mouth daily.   Multiple Vitamins-Minerals (ICAPS AREDS 2 PO) Take 1 tablet by mouth in the morning and at bedtime.   ondansetron (ZOFRAN-ODT) 4 MG disintegrating tablet Take 1 tablet (4 mg total) by mouth every 8 (eight) hours as needed for nausea or vomiting.    oxyCODONE-acetaminophen (PERCOCET) 10-325 MG tablet Take 1 tablet by mouth every 4 (four) hours as needed for pain.   pantoprazole (PROTONIX) 40 MG tablet Take 1 tablet (40 mg total) by mouth daily.   pregabalin (LYRICA) 50 MG capsule Take 1 capsule (50 mg total) by mouth at bedtime as needed.   rosuvastatin (CRESTOR) 20 MG tablet Take 1 tablet (20 mg total) by mouth daily.   triamcinolone ointment (KENALOG) 0.5 % Apply 1 application topically 2 (two) times daily. For elbows (Patient taking differently: Apply 1 application  topically 2 (two) times daily as needed (elbow itching).)   No facility-administered encounter medications on file as of 10/29/2023.    Allergies (verified) Amoxicillin, Azithromycin, Butalbital, Clindamycin hcl, Januvia [sitagliptin], Levaquin [levofloxacin in d5w], Meperidine hcl, Sulfamethoxazole, Fexofenadine hcl, Ketorolac, Macrodantin [nitrofurantoin macrocrystal], Methocarbamol, Nitrofurantoin, Pioglitazone, Tramadol, Codeine phosphate, Etodolac, Gabapentin, Naproxen, Pentazocine-naloxone hcl, Sumatriptan, Trimethoprim, and Zonegran   History: Past Medical History:  Diagnosis Date   Asthma    Depression    Diabetes mellitus without complication (HCC)    History of surgery on arm    right arm ( plate and screws)   Hypertension    Macular degeneration    Migraine    without aura   Pneumonia    Urinary incontinence    Past Surgical History:  Procedure Laterality Date   ABDOMINAL HYSTERECTOMY     ?Lt. ovary remains   APPENDECTOMY     Arm surgery     BREAST BIOPSY Right 10/10/2009   BREAST BIOPSY Right 09/17/2022   MM RT BREAST BX W LOC DEV 1ST LESION IMAGE BX SPEC STEREO GUIDE 09/17/2022 [facility]   BREAST CYST ASPIRATION  12/20/2013   BREAST EXCISIONAL BIOPSY Left 1998   BREAST EXCISIONAL BIOPSY Right 1993   ESOPHAGOGASTRODUODENOSCOPY N/A 04/17/2021   Procedure: ESOPHAGOGASTRODUODENOSCOPY (EGD);  Surgeon: Charna Elizabeth, MD;  Location: [facility] ENDOSCOPY;   Service: Endoscopy;  Laterality: N/A;  Rectal bleeding and anemia.   ESOPHAGOGASTRODUODENOSCOPY N/A 04/18/2021   Procedure: ESOPHAGOGASTRODUODENOSCOPY (EGD);  Surgeon: Jeani Hawking, MD;  Location: [facility] ENDOSCOPY;  Service: Endoscopy;  Laterality: N/A;   HOT HEMOSTASIS N/A 04/18/2021   Procedure: HOT HEMOSTASIS (ARGON PLASMA COAGULATION/BICAP);  Surgeon: Jeani Hawking, MD;  Location: [facility] ENDOSCOPY;  Service: Endoscopy;  Laterality: N/A;   IR ANGIOGRAM SELECTIVE EACH ADDITIONAL VESSEL  04/17/2021   IR ANGIOGRAM VISCERAL SELECTIVE  04/17/2021   IR EMBO ART  VEN HEMORR LYMPH EXTRAV  INC GUIDE ROADMAPPING  04/17/2021   IR US GUIDE VASC ACCESS RIGHT  04/17/2021   RADIOLOGY WITH ANESTHESIA N/A 04/17/2021   Procedure: RADIOLOGY WITH ANESTHESIA;  Surgeon: Radiologist, Medication, MD;  Location: [facility] OR;  Service: Radiology;  Laterality: N/A;   SUBMUCOSAL INJECTION  04/18/2021   Procedure: SUBMUCOSAL INJECTION;  Surgeon: Jeani Hawking, MD;  Location[facility] ENDOSCOPY;  Service: Endoscopy;;   Family History  Problem Relation Age of Onset   Hypertension Mother  Hypertension Father    Hearing loss Sister    Heart attack Sister 52       s/p 4 bypasses    Stroke Brother        64   Breast cancer Maternal Aunt    Ovarian cancer Maternal Aunt    Social History   Socioeconomic History   Marital status: Widowed    Spouse name: Not on file   Number of children: 2   Years of education: Not on file   Highest education level: Not on file  Occupational History   Occupation: Retired  Tobacco Use   Smoking status: Former    Current packs/day: 0.00    Average packs/day: 2.0 packs/day for 33.6 years (67.3 ttl pk-yrs)    Types: Cigarettes    Start date: 08/11/1957    Quit date: 04/04/1991    Years since quitting: 32.5   Smokeless tobacco: Never  Vaping Use   Vaping status: Never Used  Substance and Sexual Activity   Alcohol use: No   Drug use: No   Sexual activity: Not Currently  Other Topics Concern   Not on file   Social History Narrative   Not on file   Social Drivers of Health   Financial Resource Strain: Low Risk  (10/29/2023)   Overall Financial Resource Strain (CARDIA)    Difficulty of Paying Living Expenses: Not hard at all  Food Insecurity: No Food Insecurity (10/29/2023)   Hunger Vital Sign    Worried About Running Out of Food in the Last Year: Never true    Ran Out of Food in the Last Year: Never true  Transportation Needs: No Transportation Needs (10/29/2023)   PRAPARE - Administrator, Civil Service (Medical): No    Lack of Transportation (Non-Medical): No  Physical Activity: Inactive (10/29/2023)   Exercise Vital Sign    Days of Exercise per Week: 0 days    Minutes of Exercise per Session: 0 min  Stress: No Stress Concern Present (10/29/2023)   [employer] of Occupational Health - Occupational Stress Questionnaire    Feeling of Stress : Not at all  Social Connections: Moderately Integrated (10/29/2023)   Social Connection and Isolation Panel [NHANES]    Frequency of Communication with Friends and Family: More than three times a week    Frequency of Social Gatherings with Friends and Family: More than three times a week    Attends Religious Services: More than 4 times per year    Active Member of [employer] or Organizations: Yes    Attends Banker Meetings: More than 4 times per year    Marital Status: Widowed    Tobacco Counseling Counseling given: Not Answered    Clinical Intake:  Pre-visit preparation completed: Yes  Pain : No/denies pain Pain Score: 0-No pain     BMI - recorded: 31.31 Nutritional Status: BMI > 30  Obese Nutritional Risks: None Diabetes: Yes CBG done?: Yes (CBG 323 Per patient followed by medical attention) CBG resulted in Enter/ Edit results?: Yes Did pt. bring in CBG monitor from home?: No  Lab Results  Component Value Date   HGBA1C 5.8 08/28/2023   HGBA1C 9.8 (A) 05/22/2023   HGBA1C 6.1 09/24/2022     How often  do you need to have someone help you when you read instructions, pamphlets, or other written materials from your doctor or pharmacy?: 1 - Never  Interpreter Needed?: No  Information entered by :: Theresa Mulligan LPN   Activities  of Daily Living     10/29/2023    2:45 PM 11/18/2022   11:02 AM  In your present state of health, do you have any difficulty performing the following activities:  Hearing? 0 1  Vision? 0 1  Comment  ocular degenertion, Dr. Velna Ochs  Difficulty concentrating or making decisions? 0 0  Walking or climbing stairs? 1 1  Comment Uses a Walker   Dressing or bathing? 1 0  Comment Aide assist   Doing errands, shopping? 1 1  Comment Aide Advice worker and eating ? N   Using the Toilet? N   In the past six months, have you accidently leaked urine? Y   Comment Incontient wears Breifs and Pads   Do you have problems with loss of bowel control? N   Managing your Medications? N   Managing your Finances? N   Housekeeping or managing your Housekeeping? Y   Comment Aide assist     Patient Care Team: Caro Laroche, DO as PCP - General (Family Medicine)  Indicate any recent Medical Services you may have received from other than [facility] providers in the past year (date may be approximate).     Assessment:   This is a routine wellness examination for [name].  Hearing/Vision screen Hearing Screening - Comments:: Denies hearing difficulties   Vision Screening - Comments:: Wears rx glasses - up to date with routine eye exams with Dr Lucretia Roers    Goals Addressed               This Visit's Progress     Increase physical activity (pt-stated)        Be active.       Depression Screen      10/29/2023    2:45 PM 10/26/2023   10:44 AM 10/06/2023   11:26 AM 08/28/2023   11:05 AM 06/11/2023   11:17 AM 05/29/2023    9:57 AM 05/22/2023    9:02 AM  PHQ 2/9 Scores  PHQ - 2 Score 0 0 0 0 2  0  PHQ- 9 Score 0 0  0 6  2  Exception Documentation      Patient  refusal     Fall Risk     10/29/2023    2:48 PM 10/26/2023   10:47 AM 10/06/2023   11:26 AM 05/29/2023    9:57 AM 05/25/2023    4:04 PM  Fall Risk   Falls in the past year? 0 1 1 1 1   Number falls in past yr: 0 0 0  0  Comment   03/2023    Injury with Fall? 0 0 0  1  Risk for fall due to : No Fall Risks      Follow up Falls prevention discussed;Falls evaluation completed        MEDICARE RISK AT HOME:  Medicare Risk at Home Any stairs in or around the home?: No If so, are there any without handrails?: No Home free of loose throw rugs in walkways, pet beds, electrical cords, etc?: Yes Adequate lighting in your home to reduce risk of falls?: Yes Life alert?: No Use of a cane, walker or w/c?: Yes Grab bars in the bathroom?: No Shower chair or bench in shower?: Yes Elevated toilet seat or a handicapped toilet?: Yes  TIMED UP AND GO:  Was the test performed?  No  Cognitive Function: 6CIT completed        10/29/2023    2:49 PM 11/18/2022  11:08 AM  6CIT Screen  What Year? 0 points 0 points  What month? 0 points 0 points  What time? 0 points 0 points  Count back from 20 0 points 0 points  Months in reverse 0 points 0 points  Repeat phrase 0 points 2 points  Total Score 0 points 2 points    Immunizations Immunization History  Administered Date(s) Administered   Fluad Quad(high Dose 65+) 04/22/2019, 05/22/2021, 06/12/2022   Fluad Trivalent(High Dose 65+) 05/22/2023   Hepatitis A 12/15/2012   Influenza Split 06/11/2011, 04/26/2012   Influenza Whole 05/24/2007, 05/19/2008, 05/25/2009, 05/22/2010   Influenza,inj,Quad PF,6+ Mos 04/27/2013, 05/17/2014, 05/03/2015, 04/28/2018   Influenza-Unspecified 05/22/2017, 05/11/2020   PFIZER Comirnaty(Gray Top)Covid-19 Tri-Sucrose Vaccine 01/14/2021   PFIZER(Purple Top)SARS-COV-2 Vaccination 09/01/2019, 09/22/2019   Pfizer Covid-19 Vaccine Bivalent Booster 66yrs & up 07/11/2021   Pneumococcal Conjugate-13 05/17/2014   Pneumococcal  Polysaccharide-23 07/12/1999, 04/26/2007   Td 11/09/1997, 11/12/2007   Zoster, Live 06/07/2007    Screening Tests Health Maintenance  Topic Date Due   Zoster Vaccines- Shingrix (1 of 2) 05/05/1960   Pneumonia Vaccine 64+ Years old (3 of 3 - PPSV23 or PCV20) 05/18/2015   DTaP/Tdap/Td (3 - Tdap) 11/11/2017   COVID-19 Vaccine (5 - 2024-25 season) 04/12/2023   HEMOGLOBIN A1C  02/25/2024   OPHTHALMOLOGY EXAM  08/11/2024   Diabetic kidney evaluation - eGFR measurement  08/27/2024   Diabetic kidney evaluation - Urine ACR  08/27/2024   FOOT EXAM  09/13/2024   Medicare Annual Wellness (AWV)  10/28/2024   INFLUENZA VACCINE  Completed   DEXA SCAN  Completed   HPV VACCINES  Aged Out   Hepatitis C Screening  Discontinued    Health Maintenance  Health Maintenance Due  Topic Date Due   Zoster Vaccines- Shingrix (1 of 2) 05/05/1960   Pneumonia Vaccine 66+ Years old (3 of 3 - PPSV23 or PCV20) 05/18/2015   DTaP/Tdap/Td (3 - Tdap) 11/11/2017   COVID-19 Vaccine (5 - 2024-25 season) 04/12/2023   Health Maintenance Items Addressed:    Additional Screening:  Vision Screening: Recommended annual ophthalmology exams for early detection of glaucoma and other disorders of the eye.  Dental Screening: Recommended annual dental exams for proper oral hygiene  Community Resource Referral / Chronic Care Management: CRR required this visit?  No   CCM required this visit?  No     Plan:     I have personally reviewed and noted the following in the patient's chart:   Medical and social history Use of alcohol, tobacco or illicit drugs  Current medications and supplements including opioid prescriptions. Patient is currently taking opioid prescriptions. Information provided to patient regarding non-opioid alternatives. Patient advised to discuss non-opioid treatment plan with their provider. Functional ability and status Nutritional status Physical activity Advanced directives List of other  physicians Hospitalizations, surgeries, and ER visits in previous 12 months Vitals Screenings to include cognitive, depression, and falls Referrals and appointments  In addition, I have reviewed and discussed with patient certain preventive protocols, quality metrics, and best practice recommendations. A written personalized care plan for preventive services as well as general preventive health recommendations were provided to patient.     Tillie Rung, LPN   09/24/863   After Visit Summary: (MyChart) Due to this being a telephonic visit, the after visit summary with patients personalized plan was offered to patient via MyChart   Notes: Nothing significant to report at this time.

## 2023-10-29 NOTE — Patient Instructions (Addendum)
 [name] , Thank you for taking time to come for your Medicare Wellness Visit. I appreciate your ongoing commitment to your health goals. Please review the following plan we discussed and let me know if I can assist you in the future.   Referrals/Orders/Follow-Ups/Clinician Recommendations:   This is a list of the screening recommended for you and due dates:  Health Maintenance  Topic Date Due   Zoster (Shingles) Vaccine (1 of 2) 05/05/1960   Pneumonia Vaccine (3 of 3 - PPSV23 or PCV20) 05/18/2015   DTaP/Tdap/Td vaccine (3 - Tdap) 11/11/2017   COVID-19 Vaccine (5 - 2024-25 season) 04/12/2023   Hemoglobin A1C  02/25/2024   Eye exam for diabetics  08/11/2024   Yearly kidney function blood test for diabetes  08/27/2024   Yearly kidney health urinalysis for diabetes  08/27/2024   Complete foot exam   09/13/2024   Medicare Annual Wellness Visit  10/28/2024   Flu Shot  Completed   DEXA scan (bone density measurement)  Completed   HPV Vaccine  Aged Out   Hepatitis C Screening  Discontinued   Opioid Pain Medicine Management Opioids are powerful medicines that are used to treat moderate to severe pain. When used for short periods of time, they can help you to: Sleep better. Do better in physical or occupational therapy. Feel better in the first few days after an injury. Recover from surgery. Opioids should be taken with the supervision of a trained health care provider. They should be taken for the shortest period of time possible. This is because opioids can be addictive, and the longer you take opioids, the greater your risk of addiction. This addiction can also be called opioid use disorder. What are the risks? Using opioid pain medicines for longer than 3 days increases your risk of side effects. Side effects include: Constipation. Nausea and vomiting. Breathing difficulties (respiratory depression). Drowsiness. Confusion. Opioid use disorder. Itching. Taking opioid pain medicine  for a long period of time can affect your ability to do daily tasks. It also puts you at risk for: Motor vehicle crashes. Depression. Suicide. Heart attack. Overdose, which can be life-threatening. What is a pain treatment plan? A pain treatment plan is an agreement between you and your health care provider. Pain is unique to each person, and treatments vary depending on your condition. To manage your pain, you and your health care provider need to work together. To help you do this: Discuss the goals of your treatment, including how much pain you might expect to have and how you will manage the pain. Review the risks and benefits of taking opioid medicines. Remember that a good treatment plan uses more than one approach and minimizes the chance of side effects. Be honest about the amount of medicines you take and about any drug or alcohol use. Get pain medicine prescriptions from only one health care provider. Pain can be managed with many types of alternative treatments. Ask your health care provider to refer you to one or more specialists who can help you manage pain through: Physical or occupational therapy. Counseling (cognitive behavioral therapy). Good nutrition. Biofeedback. Massage. Meditation. Non-opioid medicine. Following a gentle exercise program. How to use opioid pain medicine Taking medicine Take your pain medicine exactly as told by your health care provider. Take it only when you need it. If your pain gets less severe, you may take less than your prescribed dose if your health care provider approves. If you are not having pain, do nottake pain medicine  unless your health care provider tells you to take it. If your pain is severe, do nottry to treat it yourself by taking more pills than instructed on your prescription. Contact your health care provider for help. Write down the times when you take your pain medicine. It is easy to become confused while on pain medicine.  Writing the time can help you avoid overdose. Take other over-the-counter or prescription medicines only as told by your health care provider. Keeping yourself and others safe  While you are taking opioid pain medicine: Do not drive, use machinery, or power tools. Do not sign legal documents. Do not drink alcohol. Do not take sleeping pills. Do not supervise children by yourself. Do not do activities that require climbing or being in high places. Do not go to a lake, river, ocean, spa, or swimming pool. Do not share your pain medicine with anyone. Keep pain medicine in a locked cabinet or in a secure area where pets and children cannot reach it. Stopping your use of opioids If you have been taking opioid medicine for more than a few weeks, you may need to slowly decrease (taper) how much you take until you stop completely. Tapering your use of opioids can decrease your risk of symptoms of withdrawal, such as: Pain and cramping in the abdomen. Nausea. Sweating. Sleepiness. Restlessness. Uncontrollable shaking (tremors). Cravings for the medicine. Do not attempt to taper your use of opioids on your own. Talk with your health care provider about how to do this. Your health care provider may prescribe a step-down schedule based on how much medicine you are taking and how long you have been taking it. Getting rid of leftover pills Do not save any leftover pills. Get rid of leftover pills safely by: Taking the medicine to a prescription take-back program. This is usually offered by the county or law enforcement. Bringing them to a pharmacy that has a drug disposal container. Flushing them down the toilet. Check the label or package insert of your medicine to see whether this is safe to do. Throwing them out in the trash. Check the label or package insert of your medicine to see whether this is safe to do. If it is safe to throw it out, remove the medicine from the original container, put it  into a sealable bag or container, and mix it with used coffee grounds, food scraps, dirt, or cat litter before putting it in the trash. Follow these instructions at home: Activity Do exercises as told by your health care provider. Avoid activities that make your pain worse. Return to your normal activities as told by your health care provider. Ask your health care provider what activities are safe for you. General instructions You may need to take these actions to prevent or treat constipation: Drink enough fluid to keep your urine pale yellow. Take over-the-counter or prescription medicines. Eat foods that are high in fiber, such as beans, whole grains, and fresh fruits and vegetables. Limit foods that are high in fat and processed sugars, such as fried or sweet foods. Keep all follow-up visits. This is important. Where to find support If you have been taking opioids for a long time, you may benefit from receiving support for quitting from a local support group or counselor. Ask your health care provider for a referral to these resources in your area. Where to find more information Centers for Disease Control and Prevention (CDC): FootballExhibition.com.br [place] Food and Drug Administration (FDA): PumpkinSearch.com.ee Get help right away  if: You may have taken too much of an opioid (overdosed). Common symptoms of an overdose: Your breathing is slower or more shallow than normal. You have a very slow heartbeat (pulse). You have slurred speech. You have nausea and vomiting. Your pupils become very small. You have other potential symptoms: You are very confused. You faint or feel like you will faint. You have cold, clammy skin. You have blue lips or fingernails. You have thoughts of harming yourself or harming others. These symptoms may represent a serious problem that is an emergency. Do not wait to see if the symptoms will go away. Get medical help right away. Call your local emergency services (911 in the  [place] Do not drive yourself to the hospital.  If you ever feel like you may hurt yourself or others, or have thoughts about taking your own life, get help right away. Go to your nearest emergency department or: Call your local emergency services (911 in the [place] Call the [facility] (225-094-4618 in the [place] Call a suicide crisis helpline, such as the National Suicide Prevention Lifeline at (804)540-6194 or 988 in the [place] This is open 24 hours a day in the [place] If you're a Veteran: Call 988 and press 1. This is open 24 hours a day. Text the [employer] at 219-822-9756. Summary Opioid medicines can help you manage moderate to severe pain for a short period of time. A pain treatment plan is an agreement between you and your health care provider. Discuss the goals of your treatment, including how much pain you might expect to have and how you will manage the pain. If you think that you or someone else may have taken too much of an opioid, get medical help right away. This information is not intended to replace advice given to you by your health care provider. Make sure you discuss any questions you have with your health care provider. Document Revised: 05/04/2023 Document Reviewed: 11/07/2020 Elsevier Patient Education  2024 [employer] Advanced directives: (Declined) Advance directive discussed with you today. Even though you declined this today, please call our office should you change your mind, and we can give you the proper paperwork for you to fill out.  Next Medicare Annual Wellness Visit scheduled for next year: Yes

## 2023-11-04 DIAGNOSIS — L503 Dermatographic urticaria: Secondary | ICD-10-CM | POA: Diagnosis not present

## 2023-11-04 DIAGNOSIS — L299 Pruritus, unspecified: Secondary | ICD-10-CM | POA: Diagnosis not present

## 2023-11-05 ENCOUNTER — Other Ambulatory Visit: Payer: Self-pay | Admitting: Family Medicine

## 2023-11-05 DIAGNOSIS — Z1231 Encounter for screening mammogram for malignant neoplasm of breast: Secondary | ICD-10-CM

## 2023-11-23 ENCOUNTER — Ambulatory Visit: Admitting: Family Medicine

## 2023-11-23 VITALS — BP 112/80 | HR 92 | Temp 98.1°F | Ht 59.0 in | Wt 170.2 lb

## 2023-11-23 DIAGNOSIS — G8929 Other chronic pain: Secondary | ICD-10-CM | POA: Diagnosis not present

## 2023-11-23 DIAGNOSIS — I10 Essential (primary) hypertension: Secondary | ICD-10-CM | POA: Diagnosis not present

## 2023-11-23 DIAGNOSIS — M546 Pain in thoracic spine: Secondary | ICD-10-CM | POA: Diagnosis not present

## 2023-11-23 DIAGNOSIS — Z794 Long term (current) use of insulin: Secondary | ICD-10-CM

## 2023-11-23 DIAGNOSIS — R42 Dizziness and giddiness: Secondary | ICD-10-CM | POA: Diagnosis not present

## 2023-11-23 DIAGNOSIS — E118 Type 2 diabetes mellitus with unspecified complications: Secondary | ICD-10-CM

## 2023-11-23 LAB — POCT GLYCOSYLATED HEMOGLOBIN (HGB A1C): HbA1c, POC (controlled diabetic range): 7.8 % — AB (ref 0.0–7.0)

## 2023-11-23 LAB — GLUCOSE, POCT (MANUAL RESULT ENTRY)
POC Glucose: 70 mg/dL (ref 70–99)
POC Glucose: 91 mg/dL (ref 70–99)

## 2023-11-23 MED ORDER — INSULIN GLARGINE 100 UNIT/ML SOLOSTAR PEN: 8.0000 [IU] | PEN_INJECTOR | SUBCUTANEOUS | 0 refills | Status: DC

## 2023-11-23 NOTE — Assessment & Plan Note (Signed)
 A1c at goal, 7.8 today. Decrease lantus with hopes to discontinue. If CBGs still below goal at followup, would consider d/c lantus. Could consider restarting ozempic at lower dose in the future.

## 2023-11-23 NOTE — Patient Instructions (Signed)
 It was great to see you!  Our plans for today:  - Decrease your lantus to 8 units instead of 12 units.  - Come back next week for follow up  Take care and seek immediate care sooner if you develop any concerns.   Dr. Genae Strine

## 2023-11-23 NOTE — Assessment & Plan Note (Signed)
 Mostly contributed to by lower blood sugar, 69 in clinic today and increased to 91 with chocolate and crackers. Some lower blood pressures may have contributed given low normal in clinic today though with negative orthostatics and higher BP on recheck. Also with some increased narcotic dosing over the weekend may have contributed. Will decrease lantus dosing with hopes to discontinue in the future. See above.

## 2023-11-23 NOTE — Assessment & Plan Note (Signed)
 Pain controlled today. Continue to follow with Pain management, ortho, NSG. Mindful of narcotic and muscle relaxer dose increases given age, h/o falls and confusion, lives alone.

## 2023-11-23 NOTE — Progress Notes (Signed)
   SUBJECTIVE:   CHIEF COMPLAINT / HPI:   Diabetes, Type 2 - Last A1c 5.8 08/2023 - Medications: lantus 12u (previously on ozempic, discontinued due to lows.) - Compliance: good, last took 10u around 9a this morning.  - Checking BG at home: yes, 110s. - Eye exam: UTD - Foot exam: UTD - Microalbumin: UTD - Statin: yes - PNA vaccine: due - some dizziness. No headaches, nausea.  - last steroid injection 3/10.  R thoracic back pain - now managed by pain management. Pain better controlled but did take a few more doses of pain medication over the weekend.  DIZZINESS Ongoing for about a month.  Exacerbated by standing from sitting.  Has had to use walker more recently due to dizziness and increases in pain medication over the weekend.  Denies nausea, vomiting.  Some headache occasionally.  Does endorse feeling some slight confusion and wondering if her blood sugars are contributing as this has happened previously.  Continuing on 12 units of Lantus though took 10u this morning as she is running out.  Last steroid injection last month.  CBGs 110-150s at home.   OBJECTIVE:   BP 112/80   Pulse 92   Temp 98.1 F (36.7 C)   Ht 4\' 11"  (1.499 m)   Wt 170 lb 3.2 oz (77.2 kg)   SpO2 97%   BMI 34.38 kg/m   Gen: well appearing, in NAD Card: Reg rate Lungs: Comfortable WOB on RA Ext: WWP, no edema Orthostatic VS for the past 24 hrs (Last 3 readings):  BP- Lying Pulse- Lying BP- Sitting Pulse- Sitting BP- Standing at 0 minutes Pulse- Standing at 0 minutes  11/23/23 1135 155/71 77 146/84 86 (!) 145/101 91     ASSESSMENT/PLAN:   Dizziness Mostly contributed to by lower blood sugar, 69 in clinic today and increased to 91 with chocolate and crackers. Some lower blood pressures may have contributed given low normal in clinic today though with negative orthostatics and higher BP on recheck. Also with some increased narcotic dosing over the weekend may have contributed. Will decrease lantus  dosing with hopes to discontinue in the future. See above.  Diabetes mellitus type 2, controlled, with complications (HCC) A1c at goal, 7.8 today. Decrease lantus with hopes to discontinue. If CBGs still below goal at followup, would consider d/c lantus. Could consider restarting ozempic at lower dose in the future.   Chronic right-sided thoracic back pain Pain controlled today. Continue to follow with Pain management, ortho, NSG. Mindful of narcotic and muscle relaxer dose increases given age, h/o falls and confusion, lives alone.      Kandis Ormond, DO

## 2023-11-24 ENCOUNTER — Encounter: Payer: Self-pay | Admitting: Physical Medicine and Rehabilitation

## 2023-11-24 ENCOUNTER — Encounter: Payer: PPO | Attending: Physical Medicine and Rehabilitation | Admitting: Physical Medicine and Rehabilitation

## 2023-11-24 VITALS — BP 152/87 | HR 100 | Ht 59.0 in | Wt 170.0 lb

## 2023-11-24 DIAGNOSIS — M546 Pain in thoracic spine: Secondary | ICD-10-CM | POA: Diagnosis not present

## 2023-11-24 DIAGNOSIS — G8929 Other chronic pain: Secondary | ICD-10-CM | POA: Diagnosis not present

## 2023-11-24 MED ORDER — LIDOCAINE HCL 1 % IJ SOLN: 5.0000 mL | Freq: Once | INTRAMUSCULAR | Status: DC

## 2023-11-24 NOTE — Progress Notes (Signed)
Trigger Point Injection  Indication: Thoracic myofascial pain not relieved by medication management and other conservative care.  Informed consent was obtained after describing risk and benefits of the procedure with the patient, this includes bleeding, bruising, infection and medication side effects.  The patient wishes to proceed and has given written consent.  The patient was placed in a seated position.  The area of pain was marked and prepped with Betadine.  It was entered with a 25-gauge 1/2 inch needle and a total of 5 mL of 1% lidocaine and normal saline was injected into a total of 4 trigger points, after negative draw back for blood.  The patient tolerated the procedure well.  Post procedure instructions were given.  

## 2023-11-30 DIAGNOSIS — E113391 Type 2 diabetes mellitus with moderate nonproliferative diabetic retinopathy without macular edema, right eye: Secondary | ICD-10-CM | POA: Diagnosis not present

## 2023-11-30 DIAGNOSIS — H04123 Dry eye syndrome of bilateral lacrimal glands: Secondary | ICD-10-CM | POA: Diagnosis not present

## 2023-11-30 DIAGNOSIS — H43813 Vitreous degeneration, bilateral: Secondary | ICD-10-CM | POA: Diagnosis not present

## 2023-11-30 DIAGNOSIS — H353132 Nonexudative age-related macular degeneration, bilateral, intermediate dry stage: Secondary | ICD-10-CM | POA: Diagnosis not present

## 2023-11-30 DIAGNOSIS — H35033 Hypertensive retinopathy, bilateral: Secondary | ICD-10-CM | POA: Diagnosis not present

## 2023-11-30 DIAGNOSIS — E113412 Type 2 diabetes mellitus with severe nonproliferative diabetic retinopathy with macular edema, left eye: Secondary | ICD-10-CM | POA: Diagnosis not present

## 2023-12-07 ENCOUNTER — Ambulatory Visit: Admitting: Family Medicine

## 2023-12-07 ENCOUNTER — Ambulatory Visit
Admission: RE | Admit: 2023-12-07 | Discharge: 2023-12-07 | Disposition: A | Discharge status: Home / Self Care | Source: Ambulatory Visit | Attending: Family Medicine

## 2023-12-07 DIAGNOSIS — Z1231 Encounter for screening mammogram for malignant neoplasm of breast: Secondary | ICD-10-CM | POA: Diagnosis not present

## 2023-12-09 ENCOUNTER — Telehealth: Payer: Self-pay

## 2023-12-09 NOTE — Telephone Encounter (Signed)
 Refill request, last filled on 11/04/23, next appt 02/23/24

## 2023-12-10 ENCOUNTER — Other Ambulatory Visit: Payer: Self-pay | Admitting: Physical Medicine and Rehabilitation

## 2023-12-10 DIAGNOSIS — G609 Hereditary and idiopathic neuropathy, unspecified: Secondary | ICD-10-CM

## 2023-12-10 DIAGNOSIS — G9059 Complex regional pain syndrome I of other specified site: Secondary | ICD-10-CM

## 2023-12-10 MED ORDER — OXYCODONE-ACETAMINOPHEN 10-325 MG PO TABS: 1.0000 | ORAL_TABLET | ORAL | 0 refills | Status: DC | PRN

## 2023-12-10 MED ORDER — BACLOFEN 20 MG PO TABS: 20.0000 mg | ORAL_TABLET | Freq: Two times a day (BID) | ORAL | 1 refills | Status: DC | PRN

## 2023-12-10 MED ORDER — PREGABALIN 50 MG PO CAPS
50.0000 mg | ORAL_CAPSULE | Freq: Every evening | ORAL | 1 refills | Status: DC | PRN
Start: 2023-12-10 — End: 2024-05-11

## 2023-12-10 MED ORDER — DULOXETINE HCL 60 MG PO CPEP
60.0000 mg | ORAL_CAPSULE | Freq: Every day | ORAL | 3 refills | Status: DC
  Filled 2024-03-30: qty 90, 90d supply, fill #0

## 2023-12-10 MED ORDER — LIDOCAINE 5 % EX OINT: TOPICAL_OINTMENT | CUTANEOUS | 1 refills | Status: DC

## 2023-12-11 ENCOUNTER — Encounter: Payer: Self-pay | Admitting: Family Medicine

## 2023-12-11 ENCOUNTER — Ambulatory Visit (INDEPENDENT_AMBULATORY_CARE_PROVIDER_SITE_OTHER): Admitting: Family Medicine

## 2023-12-11 VITALS — BP 120/72 | HR 89 | Ht 59.0 in | Wt 174.0 lb

## 2023-12-11 DIAGNOSIS — Z711 Person with feared health complaint in whom no diagnosis is made: Secondary | ICD-10-CM | POA: Insufficient documentation

## 2023-12-11 DIAGNOSIS — R413 Other amnesia: Secondary | ICD-10-CM

## 2023-12-11 DIAGNOSIS — R7989 Other specified abnormal findings of blood chemistry: Secondary | ICD-10-CM | POA: Insufficient documentation

## 2023-12-11 DIAGNOSIS — I10 Essential (primary) hypertension: Secondary | ICD-10-CM | POA: Diagnosis not present

## 2023-12-11 DIAGNOSIS — E118 Type 2 diabetes mellitus with unspecified complications: Secondary | ICD-10-CM | POA: Diagnosis not present

## 2023-12-11 MED ORDER — BENZONATATE 100 MG PO CAPS: 100.0000 mg | ORAL_CAPSULE | Freq: Two times a day (BID) | ORAL | 0 refills | Status: AC | PRN

## 2023-12-11 MED ORDER — ONDANSETRON 4 MG PO TBDP: 4.0000 mg | ORAL_TABLET | Freq: Three times a day (TID) | ORAL | 0 refills | Status: DC | PRN

## 2023-12-11 NOTE — Assessment & Plan Note (Signed)
 Obtain labs for reversible causes today. Plan for mini cog at follow up.

## 2023-12-11 NOTE — Patient Instructions (Signed)
 It was great to see you!  Our plans for today:  - Stop taking the lantus . We will keep it on your medication list in case we need it again when you get your next steroid shot.  - We updated your medication list today.  - Come back in 2 months.   We are checking some labs today, we will release these results to your MyChart.  Take care and seek immediate care sooner if you develop any concerns.   Dr. Felesia Stahlecker

## 2023-12-11 NOTE — Assessment & Plan Note (Signed)
 A1c at goal with some symptomatic lows, discontinue lantus . Will keep on med list for now given spikes around the time of her steroid injections, next in 04/2024. F/u 3 months for next A1c.

## 2023-12-11 NOTE — Progress Notes (Signed)
   SUBJECTIVE:   CHIEF COMPLAINT / HPI:   Diabetes, Type 2 - Last A1c 7.8 11/2023 - Medications: lantus  8u (previously on ozempic , discontinued due to lows.)  - decreased lantus  at last visit due to symptomatic lows. - Compliance: good - wanting to come off insulin , doesn't feel good when takes. - Checking BG at home: yes, 95-119 - Eye exam: UTD - Foot exam: UTD - Microalbumin: due, ordered previously but not collected - Statin: yes - last steroid injection 3/10. Next injection in September.   HTN - home BP 130-140s. States not taking metoprolol  as she feels it gives her a headache.  Elevated LFTs - noted on prior blood work. Previously was taking additional tylenol  along with percocet to help control pain, no longer taking excess tylenol . Denies alcohol use. No h/o hepatitis.   Memory concern - Feels she is having trouble with memory. Easily confused with medications or keeping lists, often will forget where she places things and easily forgets questions she wants to ask. Some family history of dementia. Wants to be evaluated.  OBJECTIVE:   BP 120/72   Pulse 89   Ht 4\' 11"  (1.499 m)   Wt 174 lb (78.9 kg)   SpO2 91%   BMI 35.14 kg/m   Gen: well appearing, in NAD Card: Reg rate Lungs: Comfortable WOB on RA Ext: WWP   ASSESSMENT/PLAN:   Hypertension Doing well on current regimen, no changes made today. Updated med list.  Diabetes mellitus type 2, controlled, with complications (HCC) A1c at goal with some symptomatic lows, discontinue lantus . Will keep on med list for now given spikes around the time of her steroid injections, next in 04/2024. F/u 3 months for next A1c.  Elevated LFTs Recheck today. If remains elevated despite limiting excess tylenol , plan to obtain RUQ US . Likely NASH contributing.  Concern about memory Obtain labs for reversible causes today. Plan for mini cog at follow up.      Kandis Ormond, DO

## 2023-12-11 NOTE — Assessment & Plan Note (Signed)
 Doing well on current regimen, no changes made today. Updated med list.

## 2023-12-11 NOTE — Assessment & Plan Note (Signed)
 Recheck today. If remains elevated despite limiting excess tylenol , plan to obtain RUQ US . Likely NASH contributing.

## 2023-12-12 LAB — COMPREHENSIVE METABOLIC PANEL WITH GFR
ALT: 20 IU/L (ref 0–32)
AST: 35 IU/L (ref 0–40)
Albumin: 4.4 g/dL (ref 3.7–4.7)
Alkaline Phosphatase: 96 IU/L (ref 44–121)
BUN/Creatinine Ratio: 27 (ref 12–28)
BUN: 23 mg/dL (ref 8–27)
Bilirubin Total: 0.6 mg/dL (ref 0.0–1.2)
CO2: 25 mmol/L (ref 20–29)
Calcium: 9.7 mg/dL (ref 8.7–10.3)
Chloride: 102 mmol/L (ref 96–106)
Creatinine, Ser: 0.86 mg/dL (ref 0.57–1.00)
Globulin, Total: 2.1 g/dL (ref 1.5–4.5)
Glucose: 73 mg/dL (ref 70–99)
Potassium: 4.6 mmol/L (ref 3.5–5.2)
Sodium: 142 mmol/L (ref 134–144)
Total Protein: 6.5 g/dL (ref 6.0–8.5)
eGFR: 68 mL/min/{1.73_m2} (ref >59)

## 2023-12-12 LAB — RPR: RPR Ser Ql: NONREACTIVE

## 2023-12-12 LAB — VITAMIN B12: Vitamin B-12: 831 pg/mL (ref 232–1245)

## 2023-12-12 LAB — TSH: TSH: 2.81 u[IU]/mL (ref 0.450–4.500)

## 2023-12-14 ENCOUNTER — Encounter: Payer: Self-pay | Admitting: Family Medicine

## 2023-12-14 ENCOUNTER — Ambulatory Visit (INDEPENDENT_AMBULATORY_CARE_PROVIDER_SITE_OTHER)

## 2023-12-14 ENCOUNTER — Ambulatory Visit (INDEPENDENT_AMBULATORY_CARE_PROVIDER_SITE_OTHER): Payer: PPO | Admitting: Podiatry

## 2023-12-14 DIAGNOSIS — S92515A Nondisplaced fracture of proximal phalanx of left lesser toe(s), initial encounter for closed fracture: Secondary | ICD-10-CM | POA: Diagnosis not present

## 2023-12-14 DIAGNOSIS — M79675 Pain in left toe(s): Secondary | ICD-10-CM

## 2023-12-14 DIAGNOSIS — B351 Tinea unguium: Secondary | ICD-10-CM

## 2023-12-14 DIAGNOSIS — M79674 Pain in right toe(s): Secondary | ICD-10-CM | POA: Diagnosis not present

## 2023-12-14 DIAGNOSIS — M7752 Other enthesopathy of left foot: Secondary | ICD-10-CM

## 2023-12-14 DIAGNOSIS — D2371 Other benign neoplasm of skin of right lower limb, including hip: Secondary | ICD-10-CM | POA: Diagnosis not present

## 2023-12-14 NOTE — Progress Notes (Unsigned)
 Subjective: 83 year old female presents the office today for concerns of thick, elongated nails that she is not able to trim herself.  She does get ingrown's mostly at the big toenails.  No drainage.  She states that the wart is still present the right foot.  Started become tender again.  She also thinks that she may have broken her left fifth toe.  She hit it at the end of March on her bed.  No treatment.  No other injuries.   Kandis Ormond, DO Last seen 11/24/2023  A1c 7.8 on 11/23/2023   Objective: AAO x3, NAD DP/PT pulses palpable bilaterally, CRT less than 3 seconds Nails are hypertrophic, dystrophic, brittle, discolored, elongated 10. No surrounding redness or drainage. Tenderness nails 1-5 bilaterally.  Punctate annular hyperkeratotic lesion noted to plantar aspect the right heel without any underlying ulceration, drainage or signs of infection.  No puncture wound or foreign body present. There is tenderness along the fifth toe, fifth metatarsal head.  No other areas of pinpoint tenderness. No pain with calf compression, swelling, warmth, erythema  Assessment: Symptomatic onychomycosis, skin lesion, toe fracture  Plan: -All treatment options discussed with the patient including all alternatives, risks, complications.  -X-rays obtained reviewed.  Fracture noted along the fifth proximal phalanx with minimal displacement.  No other evidence of acute fracture. -Sharply debrided nails x 10 without any complications or bleeding. -Sharply debrided the hyperkeratotic lesion on the heel without any complications or bleeding.  Cantharone applied followed by an occlusive bandage.  Postprocedure instructions discussed.  Monitor for any signs or symptoms of infection. -As far as the toe fracture we discussed placement of the toe as well as wearing shoes to avoid excess pressure.  Offered surgical shoe but she does not feel that she needs this. -Patient encouraged to call the office with  any questions, concerns, change in symptoms.   Charity Conch DPM

## 2023-12-14 NOTE — Patient Instructions (Addendum)
 Take dressing off in 8 hours and wash the foot with soap and water. If it is hurting or becomes uncomfortable before the 8 hours, go ahead and remove the bandage and wash the area.  If it blisters, apply antibiotic ointment and a band-aid.  Monitor for any signs/symptoms of infection. Call the office immediately if any occur or go directly to the emergency room. Call with any questions/concerns.   -  Toe Fracture  A toe fracture is a break in one of the toe bones (phalanges). What are the causes? A toe fracture may happen if you: Drop a heavy object on your toe. Stub your toe. Twist your toe. Exercise the same way too much. What increases the risk? Playing contact sports. Having weak bones (osteoporosis). Having a low calcium  level. What are the signs or symptoms? The main symptoms are swelling and pain in the toe. You may also have: Bruising. Stiffness. Loss of feeling (numbness). A change in the way the toe looks. Broken bones that poke through the skin. Blood under the toenail. How is this treated? Treatments may include: Taping the broken toe to a toe that is next to it (buddy taping). Wearing a shoe that has a wide, rigid sole to protect the toe and to limit its movement. Wearing a cast. A procedure to move the toe back into place. Surgery. This may be needed if: Pieces of broken bone are out of place. The bone pokes through the skin. Physical therapy exercises to help your toe move better and get stronger. Follow these instructions at home: If you have a shoe that can be taken off: Wear the shoe as told by your doctor. Take it off only as told by your doctor. Check the skin around the shoe every day. Tell your doctor if you see problems. Loosen the shoe if your toes: Tingle. Become numb. Turn cold and blue. Keep the shoe clean and dry. If you have a cast that cannot be taken off: Do not put pressure on any part of the cast until it is fully hardened. Do not  stick anything inside the cast to scratch your skin. Check the skin around the cast every day. Tell your doctor if you see problems. You may put lotion on dry skin around the cast. Do not put lotion on the skin under the cast. Keep the cast clean and dry. Bathing Do not take baths, swim, or use a hot tub. Ask your doctor about taking showers. If the shoe or cast is not waterproof: Do not let it get wet. Cover it with a watertight covering when you take a bath or shower. Activity Use crutches to support your body weight. Do not use your injured foot to support your body weight until your doctor says that you can. Ask your doctor what activities are safe for you during recovery. Avoid activities as told by your doctor. Do exercises as told by your doctor. Driving Ask your doctor if you should avoid driving or using machines while you are taking your medicine. Do not drive while wearing a cast on a foot that you use for driving. Managing pain, stiffness, and swelling  If told, put ice on the injured area. If you have a removable shoe, take it off as told by your doctor. Put ice in a plastic bag. Place a towel between your skin and the bag or between your cast and the bag. Leave the ice on for 20 minutes, 2-3 times a day. If your skin  turns bright red, take off the ice right away to prevent skin damage. The risk of damage is higher if you cannot feel pain, heat, or cold. Raise the injured area above the level of your heart while you are sitting or lying down. General instructions If your toe was taped to a toe that is next to it, follow your doctor's instructions for changing the gauze and tape. Change it more often if: The gauze and tape get wet. If this happens, dry the space between the toes. The gauze and tape are too tight and they cause your toe to become pale or to lose feeling (go numb). If your doctor did not give you a protective shoe, wear sturdy shoes that support your foot.  Your shoes should not: Pinch your toes. Fit tightly against your toes. Do not smoke or use any products that contain nicotine or tobacco. These can make it take longer for your bones to heal. If you need help quitting, ask your doctor. Take over-the-counter and prescription medicines only as told by your doctor. Keep all follow-up visits. Your doctor will check your foot to see how it is healing. Contact a doctor if: Your pain medicine is not helping. You have a fever. You notice a bad smell coming from your cast. Get help right away if: You have numbness in your toe or foot, and it is getting worse. Your toe or your foot tingles. Your toe or your foot gets cold or turns blue. You have redness or swelling in your toe or foot, and it is getting worse. You have very bad pain. This information is not intended to replace advice given to you by your health care provider. Make sure you discuss any questions you have with your health care provider. Document Revised: 08/12/2022 Document Reviewed: 08/12/2022 Elsevier Patient Education  2024 [employer].

## 2023-12-15 ENCOUNTER — Other Ambulatory Visit (HOSPITAL_COMMUNITY)
Admission: RE | Admit: 2023-12-15 | Discharge: 2023-12-15 | Disposition: A | Discharge status: Home / Self Care | Source: Ambulatory Visit | Attending: Obstetrics and Gynecology | Admitting: Obstetrics and Gynecology

## 2023-12-15 ENCOUNTER — Ambulatory Visit (INDEPENDENT_AMBULATORY_CARE_PROVIDER_SITE_OTHER)

## 2023-12-15 ENCOUNTER — Other Ambulatory Visit: Payer: Self-pay | Admitting: Obstetrics and Gynecology

## 2023-12-15 ENCOUNTER — Other Ambulatory Visit (HOSPITAL_COMMUNITY)
Admission: RE | Admit: 2023-12-15 | Discharge: 2023-12-15 | Disposition: A | Discharge status: Home / Self Care | Source: Other Acute Inpatient Hospital | Attending: Obstetrics and Gynecology | Admitting: Obstetrics and Gynecology

## 2023-12-15 DIAGNOSIS — N898 Other specified noninflammatory disorders of vagina: Secondary | ICD-10-CM | POA: Diagnosis not present

## 2023-12-15 DIAGNOSIS — R82998 Other abnormal findings in urine: Secondary | ICD-10-CM

## 2023-12-15 DIAGNOSIS — R350 Frequency of micturition: Secondary | ICD-10-CM

## 2023-12-15 DIAGNOSIS — R319 Hematuria, unspecified: Secondary | ICD-10-CM

## 2023-12-15 LAB — URINALYSIS, ROUTINE W REFLEX MICROSCOPIC
Bilirubin Urine: NEGATIVE
Glucose, UA: NEGATIVE mg/dL
Hgb urine dipstick: NEGATIVE
Ketones, ur: NEGATIVE mg/dL
Nitrite: NEGATIVE
Protein, ur: NEGATIVE mg/dL
Specific Gravity, Urine: 1.015 (ref 1.005–1.030)
pH: 6 (ref 5.0–8.0)

## 2023-12-15 LAB — POCT URINALYSIS DIPSTICK
Bilirubin, UA: NEGATIVE
Glucose, UA: NEGATIVE
Ketones, UA: NEGATIVE
Nitrite, UA: NEGATIVE
Protein, UA: POSITIVE — AB
Spec Grav, UA: 1.01 (ref 1.010–1.025)
Urobilinogen, UA: 0.2 U/dL
pH, UA: 5.5 (ref 5.0–8.0)

## 2023-12-15 LAB — URINALYSIS, MICROSCOPIC (REFLEX)

## 2023-12-15 MED ORDER — CIPROFLOXACIN HCL 250 MG PO TABS: 250.0000 mg | ORAL_TABLET | Freq: Two times a day (BID) | ORAL | 0 refills | Status: AC

## 2023-12-15 NOTE — Progress Notes (Signed)
 [name] is a 83 y.o. female arrived today with UTI sx.  Per Dr. Sunnie England protocol: A urine specimen was collected and POCT Urine was done and urine culture sent to the lab. POCT Urine was POSITIVE for Leukocytes Pt was notified and prescription sent to the preferred pharmacy.

## 2023-12-15 NOTE — Patient Instructions (Signed)
 Your Urine dip that was done in office was Positive. I am sending the urine off for culture and you can take AZO over the counter for your discomfort.  We have ordered Cipro  for you to take while we wait for your culture results, hopefully this gives you some relief. We will contact you when the results are back between 3-5 days.  If a different antibiotic is needed we will sent the order to the pharmacy and you will be notified. If you have any questions or concerns please feel free to call us  at [phone]

## 2023-12-16 ENCOUNTER — Encounter: Payer: Self-pay | Admitting: Obstetrics and Gynecology

## 2023-12-16 LAB — CERVICOVAGINAL ANCILLARY ONLY
Bacterial Vaginitis (gardnerella): NEGATIVE
Candida Glabrata: NEGATIVE
Candida Vaginitis: NEGATIVE
Comment: NEGATIVE
Comment: NEGATIVE
Comment: NEGATIVE

## 2023-12-17 ENCOUNTER — Encounter: Payer: Self-pay | Admitting: Obstetrics and Gynecology

## 2023-12-17 LAB — URINE CULTURE: Culture: 30000 — AB

## 2023-12-30 ENCOUNTER — Telehealth: Payer: Self-pay

## 2023-12-30 NOTE — Telephone Encounter (Signed)
 Patient calls nurse line requesting make an FU apt with PCP.   She reports she has been having intermittent ankle swelling. She reports this "happens from time to time."   She denies any shortness of breath or chest pains.   PCP next available is 6/6, patient scheduled.   Precautions dicussed with patient in the meantime for emergent care.   Discussed elevating her feet periodically throughout the day.

## 2023-12-31 ENCOUNTER — Other Ambulatory Visit: Payer: Self-pay | Admitting: Family Medicine

## 2024-01-12 ENCOUNTER — Telehealth: Payer: Self-pay | Admitting: Physical Medicine and Rehabilitation

## 2024-01-12 NOTE — Telephone Encounter (Signed)
 Patient needs a refill on oxycodone .  Please call patient when done.  [facility] [address] in [place].

## 2024-01-15 ENCOUNTER — Encounter: Payer: Self-pay | Admitting: Family Medicine

## 2024-01-15 ENCOUNTER — Ambulatory Visit: Admitting: Family Medicine

## 2024-01-15 VITALS — BP 129/65 | HR 85 | Ht 59.0 in | Wt 175.8 lb

## 2024-01-15 DIAGNOSIS — E118 Type 2 diabetes mellitus with unspecified complications: Secondary | ICD-10-CM | POA: Diagnosis not present

## 2024-01-15 DIAGNOSIS — M159 Polyosteoarthritis, unspecified: Secondary | ICD-10-CM | POA: Diagnosis not present

## 2024-01-15 DIAGNOSIS — M7989 Other specified soft tissue disorders: Secondary | ICD-10-CM | POA: Diagnosis not present

## 2024-01-15 NOTE — Assessment & Plan Note (Signed)
 Bilateral. Suspect dependent edema given only trace on exam, bilateral and improves with elevation. Low likelihood of DVT, cellulitis. Encouraged compression, elevation as tolerated.

## 2024-01-15 NOTE — Patient Instructions (Signed)
 It was great to see you!  Our plans for today:  - Make an appointment with our pharmacist, Dr. Koval, for CGM (continuous glucose monitor). - Check with your orthopedist about a knee injection.  - Use compression stockings when you will be standing for prolonged periods. Have your legs propped up when you will be sitting or laying for prolonged periods of time.  - Try not to take the baclofen .   Take care and seek immediate care sooner if you develop any concerns.   Dr. Chinedum Vanhouten

## 2024-01-15 NOTE — Progress Notes (Signed)
   SUBJECTIVE:   CHIEF COMPLAINT / HPI:   Diabetes - discontinued lantus  at last visit due to lows. - still with lows, no particular time of day, not usually fasting. - eating regular melas  - 110s usually fasting.  - lowest ~60s  Leg swelling - bilateral - a month duration - raising legs when sitting, helps - no SOB, CP  Knee pain - chronic - bilateral, L>R - known arthritis and previously received cortisone and synvisc injections with ortho - pain worse after fall last fall. XR at that time negative for fracture.  OBJECTIVE:   BP 129/65   Pulse 85   Ht 4\' 11"  (1.499 m)   Wt 175 lb 12.8 oz (79.7 kg)   SpO2 98%   BMI 35.51 kg/m   Gen: well appearing, in NAD Card: RRR Lungs: CTAB MSK: Knee: slight swelling noted anteriorly though no asymmetry. No erythema or overlying rashes. No joint laxity. Slightly TTP over medial and lateral joint lines.  Ext: WWP, trace b/l edema   ASSESSMENT/PLAN:   Diabetes mellitus type 2, controlled, with complications (HCC) Still with lows despite discontinuation of insulin . Will refer to pharmacy for CGM placement. Encouraged  continuing regular meal times and no skipping of meals.   Osteoarthritis, multiple sites Chronic, worsened. Discussed cortisone vs synvisc injections. Elects to return to Ortho for injection. No signs of infection on today's exam.    Leg swelling Bilateral. Suspect dependent edema given only trace on exam, bilateral and improves with elevation. Low likelihood of DVT, cellulitis. Encouraged compression, elevation as tolerated.     Kandis Ormond, DO

## 2024-01-15 NOTE — Assessment & Plan Note (Signed)
 Still with lows despite discontinuation of insulin . Will refer to pharmacy for CGM placement. Encouraged  continuing regular meal times and no skipping of meals.

## 2024-01-15 NOTE — Assessment & Plan Note (Addendum)
 Chronic, worsened. Discussed cortisone vs synvisc injections. Elects to return to Ortho for injection. No signs of infection on today's exam.

## 2024-01-18 ENCOUNTER — Encounter: Payer: Self-pay | Admitting: Registered Nurse

## 2024-01-18 ENCOUNTER — Encounter: Attending: Physical Medicine and Rehabilitation | Admitting: Registered Nurse

## 2024-01-18 VITALS — BP 147/83 | HR 94 | Ht 59.0 in | Wt 176.2 lb

## 2024-01-18 DIAGNOSIS — G894 Chronic pain syndrome: Secondary | ICD-10-CM | POA: Diagnosis not present

## 2024-01-18 DIAGNOSIS — G8929 Other chronic pain: Secondary | ICD-10-CM | POA: Diagnosis not present

## 2024-01-18 DIAGNOSIS — Z5181 Encounter for therapeutic drug level monitoring: Secondary | ICD-10-CM | POA: Diagnosis not present

## 2024-01-18 DIAGNOSIS — M546 Pain in thoracic spine: Secondary | ICD-10-CM | POA: Insufficient documentation

## 2024-01-18 DIAGNOSIS — M25562 Pain in left knee: Secondary | ICD-10-CM | POA: Diagnosis not present

## 2024-01-18 DIAGNOSIS — Z79891 Long term (current) use of opiate analgesic: Secondary | ICD-10-CM | POA: Insufficient documentation

## 2024-01-18 MED ORDER — OXYCODONE-ACETAMINOPHEN 10-325 MG PO TABS: 1.0000 | ORAL_TABLET | ORAL | 0 refills | Status: DC | PRN

## 2024-01-18 NOTE — Progress Notes (Signed)
 Subjective:    Patient ID: [name], female    DOB: 15-Nov-1940, 83 y.o.   MRN: [id number]  HPI: [name] is a 83 y.o. female who returns for follow up appointment for chronic pain and medication refill. She states her pain is located in her mid- back mainly right side and left knee pain. She rates her pain 8. Her current exercise regime is walking and performing stretching exercises.  [name] Morphine  equivalent is 90.00 MME.   UDS ordered today.     Pain Inventory Average Pain 8 Pain Right Now 8 My pain is sharp  In the last 24 hours, has pain interfered with the following? General activity 2 Relation with others 8 Enjoyment of life 9 What TIME of day is your pain at its worst? morning , daytime, evening, and night Sleep (in general) Good  Pain is worse with: walking, sitting, and inactivity Pain improves with: heat/ice, medication, and injections Relief from Meds: 8  Family History  Problem Relation Age of Onset   Hypertension Mother    Hypertension Father    Hearing loss Sister    Heart attack Sister 26       s/p 4 bypasses    Stroke Brother        49   Breast cancer Maternal Aunt    Ovarian cancer Maternal Aunt    Social History   Socioeconomic History   Marital status: Widowed    Spouse name: Not on file   Number of children: 2   Years of education: Not on file   Highest education level: Not on file  Occupational History   Occupation: Retired  Tobacco Use   Smoking status: Former    Current packs/day: 0.00    Average packs/day: 2.0 packs/day for 33.6 years (67.3 ttl pk-yrs)    Types: Cigarettes    Start date: 08/11/1957    Quit date: 04/04/1991    Years since quitting: 32.8   Smokeless tobacco: Never  Vaping Use   Vaping status: Never Used  Substance and Sexual Activity   Alcohol use: No   Drug use: No   Sexual activity: Not Currently  Other Topics Concern   Not on file  Social History Narrative   Not on file   Social Drivers of  Health   Financial Resource Strain: Low Risk  (10/29/2023)   Overall Financial Resource Strain (CARDIA)    Difficulty of Paying Living Expenses: Not hard at all  Food Insecurity: No Food Insecurity (10/29/2023)   Hunger Vital Sign    Worried About Running Out of Food in the Last Year: Never true    Ran Out of Food in the Last Year: Never true  Transportation Needs: No Transportation Needs (10/29/2023)   PRAPARE - Administrator, Civil Service (Medical): No    Lack of Transportation (Non-Medical): No  Physical Activity: Inactive (10/29/2023)   Exercise Vital Sign    Days of Exercise per Week: 0 days    Minutes of Exercise per Session: 0 min  Stress: No Stress Concern Present (10/29/2023)   [employer] of Occupational Health - Occupational Stress Questionnaire    Feeling of Stress : Not at all  Social Connections: Moderately Integrated (10/29/2023)   Social Connection and Isolation Panel [NHANES]    Frequency of Communication with Friends and Family: More than three times a week    Frequency of Social Gatherings with Friends and Family: More than three times a week    Attends  Religious Services: More than 4 times per year    Active Member of Clubs or Organizations: Yes    Attends Banker Meetings: More than 4 times per year    Marital Status: Widowed   Past Surgical History:  Procedure Laterality Date   ABDOMINAL HYSTERECTOMY     ?Lt. ovary remains   APPENDECTOMY     Arm surgery     BREAST BIOPSY Right 10/10/2009   BREAST BIOPSY Right 09/17/2022   MM RT BREAST BX W LOC DEV 1ST LESION IMAGE BX SPEC STEREO GUIDE 09/17/2022 [facility]   BREAST CYST ASPIRATION  12/20/2013   BREAST EXCISIONAL BIOPSY Left 1998   BREAST EXCISIONAL BIOPSY Right 1993   ESOPHAGOGASTRODUODENOSCOPY N/A 04/17/2021   Procedure: ESOPHAGOGASTRODUODENOSCOPY (EGD);  Surgeon: Tami Falcon, MD;  Location: [facility] ENDOSCOPY;  Service: Endoscopy;  Laterality: N/A;  Rectal bleeding and anemia.    ESOPHAGOGASTRODUODENOSCOPY N/A 04/18/2021   Procedure: ESOPHAGOGASTRODUODENOSCOPY (EGD);  Surgeon: Alvis Jourdain, MD;  Location: [facility] ENDOSCOPY;  Service: Endoscopy;  Laterality: N/A;   HOT HEMOSTASIS N/A 04/18/2021   Procedure: HOT HEMOSTASIS (ARGON PLASMA COAGULATION/BICAP);  Surgeon: Alvis Jourdain, MD;  Location: [facility] ENDOSCOPY;  Service: Endoscopy;  Laterality: N/A;   IR ANGIOGRAM SELECTIVE EACH ADDITIONAL VESSEL  04/17/2021   IR ANGIOGRAM VISCERAL SELECTIVE  04/17/2021   IR EMBO ART  VEN HEMORR LYMPH EXTRAV  INC GUIDE ROADMAPPING  04/17/2021   IR US  GUIDE VASC ACCESS RIGHT  04/17/2021   RADIOLOGY WITH ANESTHESIA N/A 04/17/2021   Procedure: RADIOLOGY WITH ANESTHESIA;  Surgeon: Radiologist, Medication, MD;  Location: [facility] OR;  Service: Radiology;  Laterality: N/A;   SUBMUCOSAL INJECTION  04/18/2021   Procedure: SUBMUCOSAL INJECTION;  Surgeon: Alvis Jourdain, MD;  Location: [facility] ENDOSCOPY;  Service: Endoscopy;;   Past Surgical History:  Procedure Laterality Date   ABDOMINAL HYSTERECTOMY     ?Lt. ovary remains   APPENDECTOMY     Arm surgery     BREAST BIOPSY Right 10/10/2009   BREAST BIOPSY Right 09/17/2022   MM RT BREAST BX W LOC DEV 1ST LESION IMAGE BX SPEC STEREO GUIDE 09/17/2022 [facility]   BREAST CYST ASPIRATION  12/20/2013   BREAST EXCISIONAL BIOPSY Left 1998   BREAST EXCISIONAL BIOPSY Right 1993   ESOPHAGOGASTRODUODENOSCOPY N/A 04/17/2021   Procedure: ESOPHAGOGASTRODUODENOSCOPY (EGD);  Surgeon: Tami Falcon, MD;  Location: [facility] ENDOSCOPY;  Service: Endoscopy;  Laterality: N/A;  Rectal bleeding and anemia.   ESOPHAGOGASTRODUODENOSCOPY N/A 04/18/2021   Procedure: ESOPHAGOGASTRODUODENOSCOPY (EGD);  Surgeon: Alvis Jourdain, MD;  Location: [facility] ENDOSCOPY;  Service: Endoscopy;  Laterality: N/A;   HOT HEMOSTASIS N/A 04/18/2021   Procedure: HOT HEMOSTASIS (ARGON PLASMA COAGULATION/BICAP);  Surgeon: Alvis Jourdain, MD;  Location: [facility] ENDOSCOPY;  Service: Endoscopy;  Laterality: N/A;   IR ANGIOGRAM SELECTIVE EACH  ADDITIONAL VESSEL  04/17/2021   IR ANGIOGRAM VISCERAL SELECTIVE  04/17/2021   IR EMBO ART  VEN HEMORR LYMPH EXTRAV  INC GUIDE ROADMAPPING  04/17/2021   IR US  GUIDE VASC ACCESS RIGHT  04/17/2021   RADIOLOGY WITH ANESTHESIA N/A 04/17/2021   Procedure: RADIOLOGY WITH ANESTHESIA;  Surgeon: Radiologist, Medication, MD;  Location: [facility] OR;  Service: Radiology;  Laterality: N/A;   SUBMUCOSAL INJECTION  04/18/2021   Procedure: SUBMUCOSAL INJECTION;  Surgeon: Alvis Jourdain, MD;  Location: [facility] ENDOSCOPY;  Service: Endoscopy;;   Past Medical History:  Diagnosis Date   Asthma    Depression    Diabetes mellitus without complication (HCC)    History of surgery on arm    right arm (  plate and screws)   Hypertension    Macular degeneration    Migraine    without aura   Pneumonia    Urinary incontinence    Ht 4' 11 (1.499 m)   BMI 35.51 kg/m   Opioid Risk Score:   Fall Risk Score:  `1  Depression screen [facility] 2/9     01/18/2024    9:07 AM 01/15/2024   10:47 AM 12/11/2023   11:22 AM 11/24/2023    2:10 PM 11/23/2023   10:50 AM 10/29/2023    2:45 PM 10/26/2023   10:44 AM  Depression screen PHQ 2/9  Decreased Interest 0 0 0 0 0 0 0  Down, Depressed, Hopeless 0 0 0 0 0 0 0  PHQ - 2 Score 0 0 0 0 0 0 0  Altered sleeping  0 0  0 0 0  Tired, decreased energy  0 0  0 0 0  Change in appetite  0 0  0 0 0  Feeling bad or failure about yourself   0 0  0 0 0  Trouble concentrating  0 0  0 0 0  Moving slowly or fidgety/restless  0 0  0 0 0  Suicidal thoughts  0 0  0 0 0  PHQ-9 Score  0 0  0 0 0  Difficult doing work/chores     Not difficult at all Not difficult at all Not difficult at all     Review of Systems  Musculoskeletal:  Positive for back pain and gait problem.       Left knee  All other systems reviewed and are negative.      Objective:   Physical Exam Vitals and nursing note reviewed.  Constitutional:      Appearance: Normal appearance.  Neck:     Comments: Cervical Paraspinal Tenderness:  C-5-C-6 Cardiovascular:     Rate and Rhythm: Normal rate and regular rhythm.     Pulses: Normal pulses.     Heart sounds: Normal heart sounds.  Pulmonary:     Effort: Pulmonary effort is normal.     Breath sounds: Normal breath sounds.   Musculoskeletal:     Comments: Normal Muscle Bulk and Muscle Testing Reveals:  Upper Extremities:Full  ROM and Muscle Strength 5/5 Right AC Joint Tenderness  Thoracic Paraspinal Tenderness: T-7-T-10 Mainly Right Side  Lower Extremities : Full ROM and Muscle Strength 5/5 Arises from Table  Slowly Narrow based Gait      Skin:    General: Skin is warm and dry.   Neurological:     Mental Status: She is alert and oriented to person, place, and time.   Psychiatric:        Mood and Affect: Mood normal.        Behavior: Behavior normal.         Assessment & Plan:  Chronic Right- Sided Thoracic Back Pain: Continue HEP as Tolerated. Continue to Monitor.  Chronic Left Knee Pain: Continue HEP as Tolerated. Continue to Monitor Chronic Pain Syndrome: Refilled: Oxycodone  10/325 mg one tablet every 4 hours as needed for pain #180. We will continue the opioid monitoring program, this consists of regular clinic visits, examinations, urine drug screen, pill counts as well as use of [place]  Controlled Substance Reporting system. A 12 month History has been reviewed on the [place]  Controlled Substance Reporting System on 01/18/2024  F/U in 1 month

## 2024-01-19 ENCOUNTER — Other Ambulatory Visit (HOSPITAL_COMMUNITY)
Admission: RE | Admit: 2024-01-19 | Discharge: 2024-01-19 | Disposition: A | Discharge status: Home / Self Care | Source: Ambulatory Visit | Attending: Obstetrics and Gynecology | Admitting: Obstetrics and Gynecology

## 2024-01-19 ENCOUNTER — Ambulatory Visit: Admitting: Obstetrics and Gynecology

## 2024-01-19 ENCOUNTER — Encounter: Payer: Self-pay | Admitting: Obstetrics and Gynecology

## 2024-01-19 VITALS — BP 129/78 | HR 92

## 2024-01-19 DIAGNOSIS — N393 Stress incontinence (female) (male): Secondary | ICD-10-CM

## 2024-01-19 DIAGNOSIS — L292 Pruritus vulvae: Secondary | ICD-10-CM | POA: Diagnosis not present

## 2024-01-19 DIAGNOSIS — N3281 Overactive bladder: Secondary | ICD-10-CM | POA: Diagnosis not present

## 2024-01-19 DIAGNOSIS — N952 Postmenopausal atrophic vaginitis: Secondary | ICD-10-CM

## 2024-01-19 DIAGNOSIS — L900 Lichen sclerosus et atrophicus: Secondary | ICD-10-CM

## 2024-01-19 MED ORDER — ESTRADIOL 0.1 MG/GM VA CREA
0.5000 g | TOPICAL_CREAM | VAGINAL | 11 refills | Status: AC
  Filled 2024-03-30: qty 42.5, 90d supply, fill #0

## 2024-01-19 MED ORDER — CIPROFLOXACIN HCL 500 MG PO TABS: 500.0000 mg | ORAL_TABLET | Freq: Once | ORAL | 0 refills | Status: AC

## 2024-01-19 MED ORDER — CLOBETASOL PROPIONATE 0.05 % EX OINT
TOPICAL_OINTMENT | CUTANEOUS | 11 refills | Status: AC
Start: 2024-01-19 — End: ?
  Filled 2024-03-30: qty 30, 30d supply, fill #0

## 2024-01-19 MED ORDER — CIPROFLOXACIN HCL 500 MG PO TABS: 500.0000 mg | ORAL_TABLET | Freq: Once | ORAL | Status: DC

## 2024-01-19 NOTE — Progress Notes (Signed)
 [facility]  Date of Visit: 01/19/2024  History of Present Illness: [name] is a 83 y.o. female with mixed incontinence, urge predominant.  s/p cystoscopy with intravesical botox  (100 units) on 03/12/23- in office  Has a vaginal itch deeper in the vagina in the last couple of days.Aaron Aas Has been using the clobetasol  and estrogen (needs refills).  No longer has any sense that she has to pee until her bladder is very full and then just leaks. Has not been able to hold her urine for a few months. Pads are saturated, going through a lot more.   Medications: She has a current medication list which includes the following prescription(s): ciprofloxacin , artificial tear ointment, baclofen , benzonatate , buspirone , duloxetine , enalapril , estradiol , famotidine , fexofenadine , onetouch verio, insulin  glargine, onetouch delica plus lancet33g, lidocaine , mometasone , multivitamin with minerals, multiple vitamins-minerals, ondansetron , oxycodone -acetaminophen , pantoprazole , pregabalin , rosuvastatin , and triamcinolone  ointment, and the following Facility-Administered Medications: lidocaine .   Allergies: Patient is allergic to amoxicillin, azithromycin , butalbital, clindamycin  hcl, januvia  [sitagliptin ], levaquin  [levofloxacin  in d5w], meperidine hcl, sulfamethoxazole, fexofenadine  hcl, ketorolac , macrodantin  [nitrofurantoin  macrocrystal], methocarbamol , nitrofurantoin , pioglitazone , tramadol , codeine phosphate, etodolac, gabapentin , naproxen, pentazocine-naloxone hcl, sumatriptan, trimethoprim , and zonegran.   Physical Exam: BP 129/78 (Cuff Size: Large)   Pulse 92   Gen: No distress  Vaginal: On speculum, thin white vaginal discharge present, aptima obtained  Assessment and Plan:  1. Overactive bladder   2. SUI (stress urinary incontinence, female)     - Had previously planned on urethral bulking for SUI and continuation of botox . She would like to resume this plan. Ciprofloxacin  ordered to take  the day of the procedure.  - Also reviewed options of sling or SNM but she would prefer office procedures first.  - Aptima swab obtained to r/o vaginal infection - refills provided for estrace  cream and clobetasol   Return for botox / urethral bulking  Arma Lamp, MD

## 2024-01-19 NOTE — Patient Instructions (Signed)
 Urinate every 2-3 hours even if you are not sure if you have to go.   Ciprofloxacin  was sent to the pharmacy. Take this prior to your procedure.

## 2024-01-20 ENCOUNTER — Ambulatory Visit: Payer: Self-pay | Admitting: Obstetrics and Gynecology

## 2024-01-20 LAB — CERVICOVAGINAL ANCILLARY ONLY
Bacterial Vaginitis (gardnerella): NEGATIVE
Candida Glabrata: NEGATIVE
Candida Vaginitis: NEGATIVE
Comment: NEGATIVE
Comment: NEGATIVE
Comment: NEGATIVE

## 2024-01-21 LAB — TOXASSURE SELECT,+ANTIDEPR,UR

## 2024-01-28 ENCOUNTER — Ambulatory Visit: Admitting: Pharmacist

## 2024-01-28 DIAGNOSIS — L2989 Other pruritus: Secondary | ICD-10-CM | POA: Diagnosis not present

## 2024-01-28 DIAGNOSIS — L820 Inflamed seborrheic keratosis: Secondary | ICD-10-CM | POA: Diagnosis not present

## 2024-01-28 DIAGNOSIS — L299 Pruritus, unspecified: Secondary | ICD-10-CM | POA: Diagnosis not present

## 2024-01-28 DIAGNOSIS — L538 Other specified erythematous conditions: Secondary | ICD-10-CM | POA: Diagnosis not present

## 2024-01-28 DIAGNOSIS — L503 Dermatographic urticaria: Secondary | ICD-10-CM | POA: Diagnosis not present

## 2024-02-02 ENCOUNTER — Ambulatory Visit: Admitting: Pharmacist

## 2024-02-03 ENCOUNTER — Ambulatory Visit: Admitting: Physician Assistant

## 2024-02-04 ENCOUNTER — Telehealth: Payer: Self-pay

## 2024-02-04 NOTE — Telephone Encounter (Signed)
 Patient calls nurse line regarding not being able to get famotidine  refilled. She states that someone cancelled this prescription. Advised patient that I was unable to see where our office had called and cancelled this.   Patient also wanted to ask provider about the dosage of medication. She states that Dermatologist advised increasing Famotidine  to two pills daily to help with her itching.   She wanted to ask Dr. Madelon prior to doing this.   Called pharmacy. They also verified that famotidine  prescription was cancelled.   If appropriate, please place new order for Famotidine .   Please include if patient should proceed with 2 tablets of stay with 1 tablet daily.   Chiquita JAYSON English, RN

## 2024-02-05 DIAGNOSIS — L503 Dermatographic urticaria: Secondary | ICD-10-CM | POA: Diagnosis not present

## 2024-02-05 DIAGNOSIS — L299 Pruritus, unspecified: Secondary | ICD-10-CM | POA: Diagnosis not present

## 2024-02-05 MED ORDER — FAMOTIDINE 40 MG PO TABS
40.0000 mg | ORAL_TABLET | Freq: Two times a day (BID) | ORAL | 1 refills | Status: DC
  Filled 2024-03-30: qty 60, 30d supply, fill #0

## 2024-02-05 NOTE — Addendum Note (Signed)
 Addended by: MADELON DONALD HERO on: 02/05/2024 09:57 AM   Modules accepted: Orders

## 2024-02-05 NOTE — Telephone Encounter (Signed)
 Rx sent for 40mg  pill.

## 2024-02-15 ENCOUNTER — Encounter: Payer: Self-pay | Admitting: Physician Assistant

## 2024-02-15 ENCOUNTER — Ambulatory Visit: Admitting: Physician Assistant

## 2024-02-15 ENCOUNTER — Other Ambulatory Visit (INDEPENDENT_AMBULATORY_CARE_PROVIDER_SITE_OTHER): Payer: Self-pay

## 2024-02-15 DIAGNOSIS — M1712 Unilateral primary osteoarthritis, left knee: Secondary | ICD-10-CM | POA: Diagnosis not present

## 2024-02-15 NOTE — Progress Notes (Signed)
 Office Visit Note   Patient: [name]           Date of Birth: 05/20/1941           MRN: [id number] Visit Date: 02/15/2024              Requested by: Debra Donald HERO, DO [address] [place],  [place] [address] PCP: Debra Donald HERO, DO   Assessment & Plan: Visit Diagnoses:  1. Primary osteoarthritis of left knee     Plan: Follow-up as needed understands to wait at least 3 months between cortisone injections.  Patient tolerated procedure well today.  Follow-Up Instructions: No follow-ups on file.   Orders:  Orders Placed This Encounter  Procedures   XR Knee 1-2 Views Left   No orders of the defined types were placed in this encounter.     Procedures: No procedures performed   Clinical Data: No additional findings.   Subjective: Chief Complaint  Patient presents with   Left Knee - Pain    HPI [name] comes in today with left knee pain.  She states has been ongoing for the past 2 to 3 months no injury.  Has giving way and locking in the knee notes swelling in the knee also.  Pain wakes her at night.  She is diabetic reports last hemoglobin A1c was 7.8.  Negative for fevers chills. Review of Systems  Constitutional:  Negative for chills and fever.     Objective: Vital Signs: There were no vitals taken for this visit.  Physical Exam Constitutional:      Appearance: She is not ill-appearing or diaphoretic.  Pulmonary:     Effort: Pulmonary effort is normal.  Neurological:     Mental Status: She is alert.     Ortho Exam Bilateral knees: Good range of motion both knees.  Left knee slight crepitus with passive range of motion.  Tenderness along medial lateral joint line left knee only.  Slight effusion left knee.  No abnormal warmth or erythema left knee.  Walks with an antalgic gait. Specialty Comments:  No specialty comments available.  Imaging: XR Knee 1-2 Views Left Result Date: 02/15/2024 Left knee 2 views: No acute fracture.   Tricompartmental arthritis with severe patellofemoral arthritic changes. Severe narrowing medial joint line.  Lateral joint line with mild articular spurring.  Loose body versus ample spur seen best on the lateral view.  Positive effusion.    PMFS History: Patient Active Problem List   Diagnosis Date Noted   Elevated LFTs 12/11/2023   Concern about memory 12/11/2023   Pulmonary nodule 06/11/2023   Pyelonephritis 06/11/2023   AAA (abdominal aortic aneurysm) (HCC) 05/23/2023   Head trauma 04/08/2023   Contusion of left knee 04/08/2023   Rhinorrhea 04/08/2023   Pain in left knee 04/03/2023   Leg swelling 10/19/2022   Pruritus 08/18/2022   Other fatigue 12/06/2021   Chronic pain syndrome    Rotator cuff syndrome of right shoulder 01/21/2019   Bladder prolapse, female, acquired 08/16/2018   Urinary incontinence 07/05/2018   Adjustment reaction with anxiety and depression 03/26/2017   Iron deficiency anemia due to chronic blood loss 10/31/2015   Chronic right-sided thoracic back pain 07/26/2015   Spinal stenosis of lumbar region 07/10/2015   Abdominal aortic atherosclerosis (HCC) 07/10/2015   Sleep apnea 05/22/2010   Hereditary and idiopathic peripheral neuropathy 02/06/2010   Irritable bowel syndrome 11/12/2007   Diabetes mellitus type 2, controlled, with complications (HCC) 10/08/2006   HYPERCHOLESTEROLEMIA 10/08/2006  HYPERTRIGLYCERIDEMIA 10/08/2006   Migraine headache 10/08/2006   Hypertension 10/08/2006   Asthma 10/08/2006   Reflux esophagitis 10/08/2006   Peptic ulcer 10/08/2006   Eczema 10/08/2006   Psoriasis 10/08/2006   Osteoarthritis, multiple sites 10/08/2006   Dizziness 10/08/2006   Past Medical History:  Diagnosis Date   Asthma    Depression    Diabetes mellitus without complication (HCC)    History of surgery on arm    right arm ( plate and screws)   Hypertension    Macular degeneration    Migraine    without aura   Pneumonia    Urinary incontinence      Family History  Problem Relation Age of Onset   Hypertension Mother    Hypertension Father    Hearing loss Sister    Heart attack Sister 76       s/p 4 bypasses    Stroke Brother        33   Breast cancer Maternal Aunt    Ovarian cancer Maternal Aunt     Past Surgical History:  Procedure Laterality Date   ABDOMINAL HYSTERECTOMY     ?Lt. ovary remains   APPENDECTOMY     Arm surgery     BREAST BIOPSY Right 10/10/2009   BREAST BIOPSY Right 09/17/2022   MM RT BREAST BX W LOC DEV 1ST LESION IMAGE BX SPEC STEREO GUIDE 09/17/2022 [facility]   BREAST CYST ASPIRATION  12/20/2013   BREAST EXCISIONAL BIOPSY Left 1998   BREAST EXCISIONAL BIOPSY Right 1993   ESOPHAGOGASTRODUODENOSCOPY N/A 04/17/2021   Procedure: ESOPHAGOGASTRODUODENOSCOPY (EGD);  Surgeon: Debra Lamprey, MD;  Location: [facility] ENDOSCOPY;  Service: Endoscopy;  Laterality: N/A;  Rectal bleeding and anemia.   ESOPHAGOGASTRODUODENOSCOPY N/A 04/18/2021   Procedure: ESOPHAGOGASTRODUODENOSCOPY (EGD);  Surgeon: Debra Dover, MD;  Location: [facility] ENDOSCOPY;  Service: Endoscopy;  Laterality: N/A;   HOT HEMOSTASIS N/A 04/18/2021   Procedure: HOT HEMOSTASIS (ARGON PLASMA COAGULATION/BICAP);  Surgeon: Debra Dover, MD;  Location: [facility] ENDOSCOPY;  Service: Endoscopy;  Laterality: N/A;   IR ANGIOGRAM SELECTIVE EACH ADDITIONAL VESSEL  04/17/2021   IR ANGIOGRAM VISCERAL SELECTIVE  04/17/2021   IR EMBO ART  VEN HEMORR LYMPH EXTRAV  INC GUIDE ROADMAPPING  04/17/2021   IR US  GUIDE VASC ACCESS RIGHT  04/17/2021   RADIOLOGY WITH ANESTHESIA N/A 04/17/2021   Procedure: RADIOLOGY WITH ANESTHESIA;  Surgeon: Radiologist, Medication, MD;  Location: [facility] OR;  Service: Radiology;  Laterality: N/A;   SUBMUCOSAL INJECTION  04/18/2021   Procedure: SUBMUCOSAL INJECTION;  Surgeon: Debra Dover, MD;  Location: [facility] ENDOSCOPY;  Service: Endoscopy;;   Social History   Occupational History   Occupation: Retired  Tobacco Use   Smoking status: Former    Current packs/day: 0.00     Average packs/day: 2.0 packs/day for 33.6 years (67.3 ttl pk-yrs)    Types: Cigarettes    Start date: 08/11/1957    Quit date: 04/04/1991    Years since quitting: 32.8   Smokeless tobacco: Never  Vaping Use   Vaping status: Never Used  Substance and Sexual Activity   Alcohol use: No   Drug use: No   Sexual activity: Not Currently

## 2024-02-16 ENCOUNTER — Telehealth: Payer: Self-pay

## 2024-02-16 NOTE — Telephone Encounter (Signed)
 Patient calls nurse line regarding questions with blood sugar management.   She reports that she was seen yesterday at Orthopedic office for severe knee pain. While she was there, she received steroid injection.   Patient is concerned about steroid injection raising her blood glucose levels.   Blood sugar yesterday AM was 139 and this AM was 197. Running int he 120's last week.   Patient denies symptoms of hyperglycemia.   Patient is asking if she should resume 8 units of daily lantus  due to recent steroid injection.   Will forward to PCP for further advisement.   Chiquita JAYSON English, RN

## 2024-02-18 NOTE — Telephone Encounter (Signed)
 Called patient back and provided with instructions per Dr. Rumball.   Patient's blood sugar yesterday morning was 101 and this AM was 130.  She will call back if she has any further concerns.   Chiquita JAYSON English, RN

## 2024-02-23 ENCOUNTER — Encounter: Payer: Self-pay | Admitting: Physical Medicine and Rehabilitation

## 2024-02-23 ENCOUNTER — Encounter: Attending: Physical Medicine and Rehabilitation | Admitting: Physical Medicine and Rehabilitation

## 2024-02-23 VITALS — BP 120/78 | HR 100 | Ht 59.0 in | Wt 168.0 lb

## 2024-02-23 DIAGNOSIS — K5900 Constipation, unspecified: Secondary | ICD-10-CM | POA: Insufficient documentation

## 2024-02-23 MED ORDER — LIDOCAINE-PRILOCAINE 2.5-2.5 % EX CREA
1.0000 | TOPICAL_CREAM | CUTANEOUS | 0 refills | Status: DC | PRN
  Filled 2024-03-30: qty 30, 30d supply, fill #0

## 2024-02-23 MED ORDER — JOURNAVX 50 MG PO TABS: 1.0000 | ORAL_TABLET | Freq: Two times a day (BID) | ORAL | 0 refills | Status: DC | PRN

## 2024-02-23 MED ORDER — OXYCODONE-ACETAMINOPHEN 10-325 MG PO TABS: 1.0000 | ORAL_TABLET | ORAL | 0 refills | Status: DC | PRN

## 2024-02-23 NOTE — Progress Notes (Signed)
 Subjective:    Patient ID: [name], female    DOB: 13-Oct-1940, 83 y.o.   MRN: [id number]  HPI   1) Thoracic myofascial pain [name] is a 83 year old woman who presents to establish care for right sided thoracic muscle pain and bilateral lumbar facet arthritis.  -heat does not help -pain is unbelievable -had an MRI in 2021 -she was having benefit from oxycodone  but needs dose to be increased- the oxycodone  provides several hours of relief -she is taking 1/2 a pill every 6 hours and she would like this changed every 4 hours -sometimes if will move higher or lower -she has been using three goats cream -she has swelling in response to methocarbamol , but chart indicates that this was due to receiving multiple new medications at the same time, did not find benefit from this or from baclofen  or flexeril . Her son feels she took too many of these yesterday and was confused as a result.  -applied some lidocaine  cream in office last visit and this helped.  -she says the pain comes and goes, last time I saw her was a good day.  -her last thoracic spine MRI was in 2018 and showed spondylosis -she did have a fall  -called today to discuss results of thoracic spine XR -pain has been pretty bad.  -she had an appointment with Dr. Scarlet today and some of her medications were changed. She is to f/u with him again in 2 weeks.  -she stayed by herself last night and thinks she is on the mend.   2) Bilateral lumbar facet arthrosis: -she has low back pain for a long time -is worse with standing and walking  -she is unable to do therapy as she is currently doing therapy for incontinence.  -was unable to get injection with Dr. Carilyn due to elevated temperature and has been rescheduled -she denies loss of bowel or bladder function -she denies radiation of pain into her legs  3) Pruritus: -she is having a severe diffuse itch, she saw the dermatologist and was started on a medication that  helps -she sometimes gets pruritus in response to medication and asks if the oxycodone  could be causing this  4) Insomnia: -she feels this is due to the pruritis   Pain Inventory Average Pain 6 Pain Right Now 6 My pain is burning, stabbing, intermittent In the last 24 hours, has pain interfered with the following? General activity 0 Relation with others 0 Enjoyment of life 0 What TIME of day is your pain at its worst? varies Sleep (in general) Fair  Pain is worse with: walking, bending, sitting, standing, some activities Pain improves with: Relief from Meds: 6     Family History  Problem Relation Age of Onset   Hypertension Mother    Hypertension Father    Hearing loss Sister    Heart attack Sister 86       s/p 4 bypasses    Stroke Brother        97   Breast cancer Maternal Aunt    Ovarian cancer Maternal Aunt    Social History   Socioeconomic History   Marital status: Widowed    Spouse name: Not on file   Number of children: 2   Years of education: Not on file   Highest education level: Not on file  Occupational History   Occupation: Retired  Tobacco Use   Smoking status: Former    Current packs/day: 0.00    Average packs/day:  2.0 packs/day for 33.6 years (67.3 ttl pk-yrs)    Types: Cigarettes    Start date: 08/11/1957    Quit date: 04/04/1991    Years since quitting: 32.9   Smokeless tobacco: Never  Vaping Use   Vaping status: Never Used  Substance and Sexual Activity   Alcohol use: No   Drug use: No   Sexual activity: Not Currently  Other Topics Concern   Not on file  Social History Narrative   Not on file   Social Drivers of Health   Financial Resource Strain: Low Risk  (10/29/2023)   Overall Financial Resource Strain (CARDIA)    Difficulty of Paying Living Expenses: Not hard at all  Food Insecurity: No Food Insecurity (10/29/2023)   Hunger Vital Sign    Worried About Running Out of Food in the Last Year: Never true    Ran Out of Food in the  Last Year: Never true  Transportation Needs: No Transportation Needs (10/29/2023)   PRAPARE - Administrator, Civil Service (Medical): No    Lack of Transportation (Non-Medical): No  Physical Activity: Inactive (10/29/2023)   Exercise Vital Sign    Days of Exercise per Week: 0 days    Minutes of Exercise per Session: 0 min  Stress: No Stress Concern Present (10/29/2023)   [employer] of Occupational Health - Occupational Stress Questionnaire    Feeling of Stress : Not at all  Social Connections: Moderately Integrated (10/29/2023)   Social Connection and Isolation Panel    Frequency of Communication with Friends and Family: More than three times a week    Frequency of Social Gatherings with Friends and Family: More than three times a week    Attends Religious Services: More than 4 times per year    Active Member of [employer] or Organizations: Yes    Attends Banker Meetings: More than 4 times per year    Marital Status: Widowed   Past Surgical History:  Procedure Laterality Date   ABDOMINAL HYSTERECTOMY     ?Lt. ovary remains   APPENDECTOMY     Arm surgery     BREAST BIOPSY Right 10/10/2009   BREAST BIOPSY Right 09/17/2022   MM RT BREAST BX W LOC DEV 1ST LESION IMAGE BX SPEC STEREO GUIDE 09/17/2022 [facility]   BREAST CYST ASPIRATION  12/20/2013   BREAST EXCISIONAL BIOPSY Left 1998   BREAST EXCISIONAL BIOPSY Right 1993   ESOPHAGOGASTRODUODENOSCOPY N/A 04/17/2021   Procedure: ESOPHAGOGASTRODUODENOSCOPY (EGD);  Surgeon: Kristie Lamprey, MD;  Location: [facility] ENDOSCOPY;  Service: Endoscopy;  Laterality: N/A;  Rectal bleeding and anemia.   ESOPHAGOGASTRODUODENOSCOPY N/A 04/18/2021   Procedure: ESOPHAGOGASTRODUODENOSCOPY (EGD);  Surgeon: Rollin Dover, MD;  Location: [facility] ENDOSCOPY;  Service: Endoscopy;  Laterality: N/A;   HOT HEMOSTASIS N/A 04/18/2021   Procedure: HOT HEMOSTASIS (ARGON PLASMA COAGULATION/BICAP);  Surgeon: Rollin Dover, MD;  Location: [facility] ENDOSCOPY;   Service: Endoscopy;  Laterality: N/A;   IR ANGIOGRAM SELECTIVE EACH ADDITIONAL VESSEL  04/17/2021   IR ANGIOGRAM VISCERAL SELECTIVE  04/17/2021   IR EMBO ART  VEN HEMORR LYMPH EXTRAV  INC GUIDE ROADMAPPING  04/17/2021   IR US  GUIDE VASC ACCESS RIGHT  04/17/2021   RADIOLOGY WITH ANESTHESIA N/A 04/17/2021   Procedure: RADIOLOGY WITH ANESTHESIA;  Surgeon: Radiologist, Medication, MD;  Location: [facility] OR;  Service: Radiology;  Laterality: N/A;   SUBMUCOSAL INJECTION  04/18/2021   Procedure: SUBMUCOSAL INJECTION;  Surgeon: Rollin Dover, MD;  Location: [facility] ENDOSCOPY;  Service: Endoscopy;;   Past  Medical History:  Diagnosis Date   Asthma    Depression    Diabetes mellitus without complication (HCC)    History of surgery on arm    right arm ( plate and screws)   Hypertension    Macular degeneration    Migraine    without aura   Pneumonia    Urinary incontinence    BP 120/78 (BP Location: Left Arm, Patient Position: Sitting, Cuff Size: Large)   Pulse 100   Ht 4' 11 (1.499 m)   Wt 168 lb (76.2 kg)   SpO2 95%   BMI 33.93 kg/m   Opioid Risk Score:   Fall Risk Score:  `1  Depression screen PHQ 2/9     02/23/2024    1:19 PM 01/18/2024    9:07 AM 01/15/2024   10:47 AM 12/11/2023   11:22 AM 11/24/2023    2:10 PM 11/23/2023   10:50 AM 10/29/2023    2:45 PM  Depression screen PHQ 2/9  Decreased Interest 0 0 0 0 0 0 0  Down, Depressed, Hopeless 0 0 0 0 0 0 0  PHQ - 2 Score 0 0 0 0 0 0 0  Altered sleeping   0 0  0 0  Tired, decreased energy   0 0  0 0  Change in appetite   0 0  0 0  Feeling bad or failure about yourself    0 0  0 0  Trouble concentrating   0 0  0 0  Moving slowly or fidgety/restless   0 0  0 0  Suicidal thoughts   0 0  0 0  PHQ-9 Score   0 0  0 0  Difficult doing work/chores      Not difficult at all Not difficult at all    Review of Systems  Musculoskeletal:  Positive for back pain and gait problem.       Spasms  All other systems reviewed and are negative.      Objective:    Physical Exam PRIOR EXAM: Gen: no distress, normal appearing HEENT: oral mucosa pink and moist, NCAT Cardio: Reg rate Chest: normal effort, normal rate of breathing Abd: soft, non-distended Ext: no edema Psych: pleasant, normal affect Skin: intact Neuro: Alert and oriented x3 Musculoskeletal: Pain reproduced with lumbar extension and standing, tenderness to palpation over bilateral lower lumbar paraspinals as well as right sided thoracic paraspinal. Negative slump test bilaterally. Antalgic gait, required wheelchair transport due to severity of thoracic paraspinal. Stable  Assessment & Plan:  Mrs. P[place]abene is a 83 year old woman who presents to establish care for the following conditions:  1) right sided thoracic muscle spasm: -d/c robaxin  -discussed that Journavx  is a highly selective inhibitor for Nav 1.8, which is specific for pain in the peripheral nervous system, discussed that lidocaine  in contrast affects all Nav receptors, discussed that patient can try using this medication as prn for pain, though its studies have focused on its use for acute pain. Discussed that it has been studied against opioids for acute pain with comparable efficacy. Discussed that I have not seen any patient side effects thus far. Discussed that we have samples available and we have copay cards available. Discussed that outpatient if the medication requires a prior auth the copay should be $30 for at least a 60 day supply. The medication may be more likely to be in stock in [facility] and [facility]. We do have samples available.   -MRI thoracic spine ordered -discussed  that she can try taking a third baclofen  if the pain is very severe -increase percocet to 10g q4H when refill is due, for now continue 5mg  q4H prn -recommend applying blue emu oil -thoracic spine XR ordered to assess for compression fracture.  -increase mobic  to 7.5mg  BID, discussed side effects -prescribed lidocaine  patch -MRI thoracic spine  reviewed with patient and son (from 2021): shows spondylosis -discussed that it is always good to use the least amount of pain medication that she needs -discussed that she has  -continue balcofen 10mg  TID but take only at night if sedated -discussed results of thoracic spine XR -trigger point injection to right upper back next visit with me.  -discussed that Percocet has been helping -will plan for trigger point injections next visit -recommended taking Coenzyme Q10 while on statin  2) Lumbar spinal stenosis -discussed that she is getting steroid injections by Dr. Colon  3) constipation: -discussed that she gets this in response to oxycodone  -discussed benefits of celery juice -discussed that this has improved -continue stool softener  4) Insomnia: -Try to go outside near sunrise -Get exercise during the day.  -Turn off all devices an hour before bedtime.  -Teas that can benefit: chamomile, valerian root, Brahmi (Bacopa) -Can consider over the counter melatonin, magnesium , and/or L-theanine. Melatonin is an anti-oxidant with multiple health benefits. Magnesium  is involved in greater than 300 enzymatic reactions in the body and most of us  are deficient as our soil is often depleted. There are 7 different types of magnesium - Bioptemizer's is a supplement with all 7 types, and each has unique benefits. Magnesium  can also help with constipation and anxiety.  -Pistachios naturally increase the production of melatonin -Cozy Earth bamboo bed sheets are free from toxic chemicals.  -Tart cherry juice or a tart cherry supplement can improve sleep and soreness post-workout   5) Pruritus: -continue medication prescribed by dermatologist -discussed that this could be a side effect of the oxycodone 

## 2024-02-23 NOTE — Patient Instructions (Addendum)
 Relaxium: magnesium , ashwaganda, melatonin  Foods that may reduce pain: 1) Ginger (especially studied for arthritis)- reduce leukotriene production to decrease inflammation 2) Blueberries- high in phytonutrients that decrease inflammation 3) Salmon- marine omega-3s reduce joint swelling and pain 4) Pumpkin seeds- reduce inflammation 5) dark chocolate- reduces inflammation 6) turmeric- reduces inflammation 7) tart cherries - reduce pain and stiffness 8) extra virgin olive oil - its compound olecanthal helps to block prostaglandins  9) chili peppers- can be eaten or applied topically via capsaicin 10) mint- helpful for headache, muscle aches, joint pain, and itching 11) garlic- reduces inflammation 12) Green tea- reduces inflammation and oxidative stress, helps with weight loss, may reduce the risk of cancer, recommend Double Green Matcha [place] of Tea daily  Link to further information on diet for chronic pain: http://www.bray.com/   Insomnia: -Try to go outside near sunrise -Get exercise during the day.  -Turn off all devices an hour before bedtime.  -Teas that can benefit: chamomile, valerian root, Brahmi (Bacopa) -Can consider over the counter melatonin, magnesium , and/or L-theanine. Melatonin is an anti-oxidant with multiple health benefits. Magnesium  is involved in greater than 300 enzymatic reactions in the body and most of us  are deficient as our soil is often depleted. There are 7 different types of magnesium - Bioptemizer's is a supplement with all 7 types, and each has unique benefits. Magnesium  can also help with constipation and anxiety.  -Pistachios naturally increase the production of melatonin -Cozy Earth bamboo bed sheets are free from toxic chemicals.  -Tart cherry juice or a tart cherry supplement can improve sleep and soreness post-workout

## 2024-02-26 ENCOUNTER — Telehealth: Payer: Self-pay

## 2024-02-26 NOTE — Telephone Encounter (Signed)
 Prior authorization submitted for Lidocaine  Prilocaine  on 02/25/2024.   Rx was denied by insurance. Fax placed in Dr. Lennette box if an appeal is needed.

## 2024-03-08 DIAGNOSIS — D225 Melanocytic nevi of trunk: Secondary | ICD-10-CM | POA: Diagnosis not present

## 2024-03-08 DIAGNOSIS — L814 Other melanin hyperpigmentation: Secondary | ICD-10-CM | POA: Diagnosis not present

## 2024-03-08 DIAGNOSIS — L821 Other seborrheic keratosis: Secondary | ICD-10-CM | POA: Diagnosis not present

## 2024-03-08 DIAGNOSIS — L299 Pruritus, unspecified: Secondary | ICD-10-CM | POA: Diagnosis not present

## 2024-03-08 DIAGNOSIS — L503 Dermatographic urticaria: Secondary | ICD-10-CM | POA: Diagnosis not present

## 2024-03-08 DIAGNOSIS — L820 Inflamed seborrheic keratosis: Secondary | ICD-10-CM | POA: Diagnosis not present

## 2024-03-08 DIAGNOSIS — L538 Other specified erythematous conditions: Secondary | ICD-10-CM | POA: Diagnosis not present

## 2024-03-08 DIAGNOSIS — L2989 Other pruritus: Secondary | ICD-10-CM | POA: Diagnosis not present

## 2024-03-18 ENCOUNTER — Ambulatory Visit (INDEPENDENT_AMBULATORY_CARE_PROVIDER_SITE_OTHER): Admitting: Podiatry

## 2024-03-18 ENCOUNTER — Ambulatory Visit

## 2024-03-18 VITALS — Ht 59.0 in | Wt 168.0 lb

## 2024-03-18 DIAGNOSIS — M79674 Pain in right toe(s): Secondary | ICD-10-CM | POA: Diagnosis not present

## 2024-03-18 DIAGNOSIS — M7741 Metatarsalgia, right foot: Secondary | ICD-10-CM

## 2024-03-18 DIAGNOSIS — B351 Tinea unguium: Secondary | ICD-10-CM

## 2024-03-18 DIAGNOSIS — E118 Type 2 diabetes mellitus with unspecified complications: Secondary | ICD-10-CM

## 2024-03-18 DIAGNOSIS — M79675 Pain in left toe(s): Secondary | ICD-10-CM

## 2024-03-24 NOTE — Progress Notes (Signed)
 Subjective: Chief Complaint  Patient presents with   Toe Injury    RM 12 Closed nondisplaced fracture of proximal phalanx of lesser toe of left foot, initial encounter. Patient state no pain or discomfort in the toe. Patient is concerned about pain in the right foot by the ball of the foot. Patient is requesting nail trimming today.    83 year old female presents the office today for concerns of thick, elongated nails that she is not able to trim herself.  She does get ingrown's mostly at the big toenails.  No drainage.  She states that the wart is still present the right foot.  Started become tender again.  Hurts mostly with pressure.  No open lesions or injuries.  Has been doing good but she is describing more discomfort to the ball of the right foot.  She does not recall any injuries to this area.  No recent treatment.  Madelon Donald HERO, DO Last seen 01/15/2024  A1c 7.8 on 11/23/2023   Objective: AAO x3, NAD DP/PT pulses palpable bilaterally, CRT less than 3 seconds Nails are hypertrophic, dystrophic, brittle, discolored, elongated 10. No surrounding redness or drainage. Tenderness nails 1-5 bilaterally.  Punctate annular hyperkeratotic lesion noted to plantar aspect the right heel without any underlying ulceration, drainage or signs of infection.  No puncture wound or foreign body present. There is no tenderness in the left fifth toe today.  There is tenderness palpation submetatarsal on the right foot.  Prominent metatarsal heads noted.  She also has some discomfort to the plantar of the heel there is no pain with lateral compression of calcaneus.  No pain Achilles tendon. No pain with calf compression, swelling, warmth, erythema  Assessment: Symptomatic onychomycosis, skin lesion, tarsalgia  Plan: Radiology -X-rays obtained reviewed of the right foot.  Multiple views obtained.  Joint space narrowing noted along the first MTPJ with medial deviation of the lesser digits.  Calcaneal  spurring present.  No evidence of acute fracture.  Onychomycosis/skin lesion -Sharply debrided nails x 10 without any complications or bleeding. -Sharply debrided the hyperkeratotic lesion on the heel without any complications or bleeding.  Offloading, moisturizer.  Foot pain - Likely result of metatarsalgia, prominent metatarsal heads pain.  Dispensed heel cups offloading pads to the submetatarsal area.  Discussed supportive shoe gear.  Return in about 9 weeks (around 05/20/2024) for nail trim/foot pain.  [name] Fees DPM

## 2024-03-29 ENCOUNTER — Other Ambulatory Visit (HOSPITAL_COMMUNITY): Payer: Self-pay

## 2024-03-30 ENCOUNTER — Encounter (HOSPITAL_COMMUNITY): Payer: Self-pay | Admitting: Pharmacist

## 2024-03-30 ENCOUNTER — Other Ambulatory Visit: Payer: Self-pay

## 2024-03-30 ENCOUNTER — Other Ambulatory Visit (HOSPITAL_COMMUNITY): Payer: Self-pay

## 2024-03-30 ENCOUNTER — Other Ambulatory Visit (HOSPITAL_BASED_OUTPATIENT_CLINIC_OR_DEPARTMENT_OTHER): Payer: Self-pay

## 2024-03-30 MED ORDER — BUSPIRONE HCL 5 MG PO TABS
5.0000 mg | ORAL_TABLET | Freq: Three times a day (TID) | ORAL | 3 refills | Status: DC
  Filled 2024-03-30: qty 270, 90d supply, fill #0

## 2024-03-30 MED ORDER — FAMOTIDINE 40 MG PO TABS
40.0000 mg | ORAL_TABLET | Freq: Every day | ORAL | 3 refills | Status: DC
  Filled 2024-03-30: qty 90, 90d supply, fill #0

## 2024-03-30 MED ORDER — LIDOCAINE 5 % EX PTCH
1.0000 | MEDICATED_PATCH | Freq: Two times a day (BID) | CUTANEOUS | 2 refills | Status: DC
  Filled 2024-03-30 – 2024-04-07 (×2): qty 30, 15d supply, fill #0

## 2024-03-30 MED ORDER — NALOXONE HCL 4 MG/0.1ML NA LIQD
NASAL | 4 refills | Status: DC
  Filled 2024-03-30: qty 2, 1d supply, fill #0

## 2024-03-30 MED ORDER — FAMOTIDINE 20 MG PO TABS
20.0000 mg | ORAL_TABLET | Freq: Two times a day (BID) | ORAL | 1 refills | Status: DC
  Filled 2024-03-30 – 2024-04-28 (×2): qty 60, 30d supply, fill #0

## 2024-03-30 MED ORDER — BUSPIRONE HCL 10 MG PO TABS
10.0000 mg | ORAL_TABLET | Freq: Three times a day (TID) | ORAL | 2 refills | Status: AC
  Filled 2024-03-30: qty 270, 90d supply, fill #0

## 2024-03-30 MED ORDER — LEVOCETIRIZINE DIHYDROCHLORIDE 5 MG PO TABS
5.0000 mg | ORAL_TABLET | Freq: Every day | ORAL | 0 refills | Status: DC
  Filled 2024-03-30 – 2024-06-22 (×2): qty 30, 30d supply, fill #0

## 2024-03-30 MED ORDER — METOPROLOL SUCCINATE ER 25 MG PO TB24
25.0000 mg | ORAL_TABLET | Freq: Every day | ORAL | 3 refills | Status: DC
  Filled 2024-03-30: qty 90, 90d supply, fill #0

## 2024-03-30 MED ORDER — FLUTICASONE PROPIONATE 50 MCG/ACT NA SUSP
2.0000 | Freq: Every day | NASAL | 6 refills | Status: DC
  Filled 2024-03-30: qty 16, 30d supply, fill #0

## 2024-03-30 MED ORDER — LIDOCAINE 5 % EX OINT
TOPICAL_OINTMENT | CUTANEOUS | 3 refills | Status: DC
  Filled 2024-03-30: qty 35.44, 15d supply, fill #0

## 2024-03-30 MED ORDER — DULOXETINE HCL 60 MG PO CPEP
60.0000 mg | ORAL_CAPSULE | Freq: Every day | ORAL | 3 refills | Status: DC
  Filled 2024-03-30: qty 90, 90d supply, fill #0

## 2024-03-30 MED ORDER — BACLOFEN 20 MG PO TABS
20.0000 mg | ORAL_TABLET | Freq: Two times a day (BID) | ORAL | 1 refills | Status: DC | PRN
  Filled 2024-03-30: qty 90, 45d supply, fill #0

## 2024-03-30 MED ORDER — LEVOCETIRIZINE DIHYDROCHLORIDE 5 MG PO TABS
5.0000 mg | ORAL_TABLET | Freq: Two times a day (BID) | ORAL | 2 refills | Status: DC
  Filled 2024-03-30 – 2024-05-17 (×2): qty 60, 30d supply, fill #0

## 2024-03-30 MED FILL — Buspirone HCl Tab 10 MG: ORAL | 90 days supply | Qty: 270 | Fill #0 | Status: CN

## 2024-03-31 ENCOUNTER — Other Ambulatory Visit (HOSPITAL_COMMUNITY): Payer: Self-pay

## 2024-03-31 ENCOUNTER — Other Ambulatory Visit: Payer: Self-pay

## 2024-04-01 ENCOUNTER — Other Ambulatory Visit: Payer: Self-pay

## 2024-04-01 ENCOUNTER — Other Ambulatory Visit (HOSPITAL_BASED_OUTPATIENT_CLINIC_OR_DEPARTMENT_OTHER): Payer: Self-pay

## 2024-04-01 ENCOUNTER — Other Ambulatory Visit (HOSPITAL_COMMUNITY): Payer: Self-pay

## 2024-04-01 NOTE — Progress Notes (Signed)
 Subjective:    Patient ID: [name], female    DOB: 1941/02/18, 83 y.o.   MRN: [id number]  HPI: Kebrina Grant is a 83 y.o. female who returns for follow up appointment for chronic pain and medication refill. She states her pain is located in her right shoulder, lower back and left knee pain. She rates her pain 10. Her current exercise regime is walking with walker and performing stretching exercises.  [name] Morphine  equivalent is 90.00 MME.   Last UDS was Performed on 01/18/2024, it was consistent.     Pain Inventory Average Pain 6 Pain Right Now 10 My pain is intermittent and sharp  In the last 24 hours, has pain interfered with the following? General activity 10 Relation with others 3 Enjoyment of life 10 What TIME of day is your pain at its worst? morning , daytime, evening, and night Sleep (in general) Fair  Pain is worse with: walking, bending, sitting, inactivity, standing, and some activites Pain improves with: rest, medication, and injections Relief from Meds: 7  Family History  Problem Relation Age of Onset   Hypertension Mother    Hypertension Father    Hearing loss Sister    Heart attack Sister 93       s/p 4 bypasses    Stroke Brother        73   Breast cancer Maternal Aunt    Ovarian cancer Maternal Aunt    Social History   Socioeconomic History   Marital status: Widowed    Spouse name: Not on file   Number of children: 2   Years of education: Not on file   Highest education level: Not on file  Occupational History   Occupation: Retired  Tobacco Use   Smoking status: Former    Current packs/day: 0.00    Average packs/day: 2.0 packs/day for 33.6 years (67.3 ttl pk-yrs)    Types: Cigarettes    Start date: 08/11/1957    Quit date: 04/04/1991    Years since quitting: 33.0   Smokeless tobacco: Never  Vaping Use   Vaping status: Never Used  Substance and Sexual Activity   Alcohol use: No   Drug use: No   Sexual activity: Not Currently   Other Topics Concern   Not on file  Social History Narrative   Not on file   Social Drivers of Health   Financial Resource Strain: Low Risk  (10/29/2023)   Overall Financial Resource Strain (CARDIA)    Difficulty of Paying Living Expenses: Not hard at all  Food Insecurity: No Food Insecurity (10/29/2023)   Hunger Vital Sign    Worried About Running Out of Food in the Last Year: Never true    Ran Out of Food in the Last Year: Never true  Transportation Needs: No Transportation Needs (10/29/2023)   PRAPARE - Administrator, Civil Service (Medical): No    Lack of Transportation (Non-Medical): No  Physical Activity: Inactive (10/29/2023)   Exercise Vital Sign    Days of Exercise per Week: 0 days    Minutes of Exercise per Session: 0 min  Stress: No Stress Concern Present (10/29/2023)   [employer] of Occupational Health - Occupational Stress Questionnaire    Feeling of Stress : Not at all  Social Connections: Moderately Integrated (10/29/2023)   Social Connection and Isolation Panel    Frequency of Communication with Friends and Family: More than three times a week    Frequency of Social Gatherings with Friends  and Family: More than three times a week    Attends Religious Services: More than 4 times per year    Active Member of Clubs or Organizations: Yes    Attends Banker Meetings: More than 4 times per year    Marital Status: Widowed   Past Surgical History:  Procedure Laterality Date   ABDOMINAL HYSTERECTOMY     ?Lt. ovary remains   APPENDECTOMY     Arm surgery     BREAST BIOPSY Right 10/10/2009   BREAST BIOPSY Right 09/17/2022   MM RT BREAST BX W LOC DEV 1ST LESION IMAGE BX SPEC STEREO GUIDE 09/17/2022 [facility]   BREAST CYST ASPIRATION  12/20/2013   BREAST EXCISIONAL BIOPSY Left 1998   BREAST EXCISIONAL BIOPSY Right 1993   ESOPHAGOGASTRODUODENOSCOPY N/A 04/17/2021   Procedure: ESOPHAGOGASTRODUODENOSCOPY (EGD);  Surgeon: Kristie Lamprey, MD;   Location: [facility] ENDOSCOPY;  Service: Endoscopy;  Laterality: N/A;  Rectal bleeding and anemia.   ESOPHAGOGASTRODUODENOSCOPY N/A 04/18/2021   Procedure: ESOPHAGOGASTRODUODENOSCOPY (EGD);  Surgeon: Rollin Dover, MD;  Location: [facility] ENDOSCOPY;  Service: Endoscopy;  Laterality: N/A;   HOT HEMOSTASIS N/A 04/18/2021   Procedure: HOT HEMOSTASIS (ARGON PLASMA COAGULATION/BICAP);  Surgeon: Rollin Dover, MD;  Location: [facility] ENDOSCOPY;  Service: Endoscopy;  Laterality: N/A;   IR ANGIOGRAM SELECTIVE EACH ADDITIONAL VESSEL  04/17/2021   IR ANGIOGRAM VISCERAL SELECTIVE  04/17/2021   IR EMBO ART  VEN HEMORR LYMPH EXTRAV  INC GUIDE ROADMAPPING  04/17/2021   IR US  GUIDE VASC ACCESS RIGHT  04/17/2021   RADIOLOGY WITH ANESTHESIA N/A 04/17/2021   Procedure: RADIOLOGY WITH ANESTHESIA;  Surgeon: Radiologist, Medication, MD;  Location: [facility] OR;  Service: Radiology;  Laterality: N/A;   SUBMUCOSAL INJECTION  04/18/2021   Procedure: SUBMUCOSAL INJECTION;  Surgeon: Rollin Dover, MD;  Location: [facility] ENDOSCOPY;  Service: Endoscopy;;   Past Surgical History:  Procedure Laterality Date   ABDOMINAL HYSTERECTOMY     ?Lt. ovary remains   APPENDECTOMY     Arm surgery     BREAST BIOPSY Right 10/10/2009   BREAST BIOPSY Right 09/17/2022   MM RT BREAST BX W LOC DEV 1ST LESION IMAGE BX SPEC STEREO GUIDE 09/17/2022 [facility]   BREAST CYST ASPIRATION  12/20/2013   BREAST EXCISIONAL BIOPSY Left 1998   BREAST EXCISIONAL BIOPSY Right 1993   ESOPHAGOGASTRODUODENOSCOPY N/A 04/17/2021   Procedure: ESOPHAGOGASTRODUODENOSCOPY (EGD);  Surgeon: Kristie Lamprey, MD;  Location: [facility] ENDOSCOPY;  Service: Endoscopy;  Laterality: N/A;  Rectal bleeding and anemia.   ESOPHAGOGASTRODUODENOSCOPY N/A 04/18/2021   Procedure: ESOPHAGOGASTRODUODENOSCOPY (EGD);  Surgeon: Rollin Dover, MD;  Location: [facility] ENDOSCOPY;  Service: Endoscopy;  Laterality: N/A;   HOT HEMOSTASIS N/A 04/18/2021   Procedure: HOT HEMOSTASIS (ARGON PLASMA COAGULATION/BICAP);  Surgeon: Rollin Dover, MD;   Location: [facility] ENDOSCOPY;  Service: Endoscopy;  Laterality: N/A;   IR ANGIOGRAM SELECTIVE EACH ADDITIONAL VESSEL  04/17/2021   IR ANGIOGRAM VISCERAL SELECTIVE  04/17/2021   IR EMBO ART  VEN HEMORR LYMPH EXTRAV  INC GUIDE ROADMAPPING  04/17/2021   IR US  GUIDE VASC ACCESS RIGHT  04/17/2021   RADIOLOGY WITH ANESTHESIA N/A 04/17/2021   Procedure: RADIOLOGY WITH ANESTHESIA;  Surgeon: Radiologist, Medication, MD;  Location: [facility] OR;  Service: Radiology;  Laterality: N/A;   SUBMUCOSAL INJECTION  04/18/2021   Procedure: SUBMUCOSAL INJECTION;  Surgeon: Rollin Dover, MD;  Location: [facility] ENDOSCOPY;  Service: Endoscopy;;   Past Medical History:  Diagnosis Date   Asthma    Depression    Diabetes mellitus without complication (HCC)  History of surgery on arm    right arm ( plate and screws)   Hypertension    Macular degeneration    Migraine    without aura   Pneumonia    Urinary incontinence    There were no vitals taken for this visit.  Opioid Risk Score:   Fall Risk Score:  `1  Depression screen PHQ 2/9     02/23/2024    1:19 PM 01/18/2024    9:07 AM 01/15/2024   10:47 AM 12/11/2023   11:22 AM 11/24/2023    2:10 PM 11/23/2023   10:50 AM 10/29/2023    2:45 PM  Depression screen PHQ 2/9  Decreased Interest 0 0 0 0 0 0 0  Down, Depressed, Hopeless 0 0 0 0 0 0 0  PHQ - 2 Score 0 0 0 0 0 0 0  Altered sleeping   0 0  0 0  Tired, decreased energy   0 0  0 0  Change in appetite   0 0  0 0  Feeling bad or failure about yourself    0 0  0 0  Trouble concentrating   0 0  0 0  Moving slowly or fidgety/restless   0 0  0 0  Suicidal thoughts   0 0  0 0  PHQ-9 Score   0 0  0 0  Difficult doing work/chores      Not difficult at all Not difficult at all    Review of Systems  Musculoskeletal:  Positive for back pain and gait problem.       Pain in left knee, right shoulder pain  All other systems reviewed and are negative.      Objective:   Physical Exam Vitals and nursing note reviewed.  Constitutional:       Appearance: Normal appearance.  Cardiovascular:     Rate and Rhythm: Normal rate and regular rhythm.  Musculoskeletal:     Comments: Normal Muscle Bulk and Muscle Testing Reveals:  Upper Extremities: Right: Decreased ROM 90 Degrees  and Muscle Strength 5/5 Right AC Joint Tenderness Left Full ROM and Muscle Strength 5/5 Thoracic Paraspinal Tenderness: T-1-T-2 Lumbar Paraspinal Tenderness: L-3-L-5 Lower Extremities : Full ROM and Muscle Strength 5/5 Arises from Table slowly using walker for support Narrow Based Gait     Neurological:     Mental Status: She is alert.           Assessment & Plan:  Chronic Right- Sided Thoracic Back Pain: Continue HEP as Tolerated. Continue to Monitor. 04/04/2024 Chronic Left Knee Pain: Continue HEP as Tolerated. Continue to Monitor. 04/04/2024 Chronic Pain Syndrome: Refilled: Oxycodone  10/325 mg one tablet every 4 hours as needed for pain #180. We will continue the opioid monitoring program, this consists of regular clinic visits, examinations, urine drug screen, pill counts as well as use of [place]  Controlled Substance Reporting system. A 12 month History has been reviewed on the [place]  Controlled Substance Reporting System on 04/04/2024   F/U in 1 month

## 2024-04-04 ENCOUNTER — Other Ambulatory Visit (HOSPITAL_COMMUNITY): Payer: Self-pay

## 2024-04-04 ENCOUNTER — Encounter: Attending: Physical Medicine and Rehabilitation | Admitting: Registered Nurse

## 2024-04-04 ENCOUNTER — Telehealth: Payer: Self-pay

## 2024-04-04 ENCOUNTER — Encounter: Payer: Self-pay | Admitting: Registered Nurse

## 2024-04-04 VITALS — BP 126/75 | HR 93 | Ht 59.0 in | Wt 182.0 lb

## 2024-04-04 DIAGNOSIS — Z5181 Encounter for therapeutic drug level monitoring: Secondary | ICD-10-CM

## 2024-04-04 DIAGNOSIS — Z79891 Long term (current) use of opiate analgesic: Secondary | ICD-10-CM

## 2024-04-04 DIAGNOSIS — M25511 Pain in right shoulder: Secondary | ICD-10-CM

## 2024-04-04 DIAGNOSIS — G8929 Other chronic pain: Secondary | ICD-10-CM | POA: Diagnosis not present

## 2024-04-04 DIAGNOSIS — G894 Chronic pain syndrome: Secondary | ICD-10-CM

## 2024-04-04 DIAGNOSIS — M25562 Pain in left knee: Secondary | ICD-10-CM

## 2024-04-04 DIAGNOSIS — M545 Low back pain, unspecified: Secondary | ICD-10-CM | POA: Diagnosis not present

## 2024-04-04 MED ORDER — OXYCODONE-ACETAMINOPHEN 10-325 MG PO TABS
1.0000 | ORAL_TABLET | ORAL | 0 refills | Status: DC | PRN
  Filled 2024-04-04 – 2024-04-21 (×2): qty 180, 30d supply, fill #0

## 2024-04-04 NOTE — Telephone Encounter (Signed)
 Prior authorization submitted for LIDOCAINE  PATCHES to [facility] ADVANTAGE/RX ADVANCE via Latent.   Key: [id number]

## 2024-04-05 NOTE — Telephone Encounter (Signed)
 Pharmacy Patient Advocate Encounter  Received notification from [employer] that Prior Authorization for LIDOCAINE  5% PATCHES has been DENIED.        LIDOCAINE  5% PATCHES AVAILABLE OTC   PA #/Case ID/Reference #: [id number]

## 2024-04-06 ENCOUNTER — Telehealth: Payer: Self-pay

## 2024-04-06 NOTE — Telephone Encounter (Signed)
 Lidocaine  ointment 5% denied by insurance.

## 2024-04-07 ENCOUNTER — Other Ambulatory Visit (HOSPITAL_COMMUNITY): Payer: Self-pay

## 2024-04-07 ENCOUNTER — Other Ambulatory Visit: Payer: Self-pay

## 2024-04-07 DIAGNOSIS — E119 Type 2 diabetes mellitus without complications: Secondary | ICD-10-CM

## 2024-04-07 DIAGNOSIS — E7800 Pure hypercholesterolemia, unspecified: Secondary | ICD-10-CM

## 2024-04-07 MED ORDER — ONETOUCH VERIO VI STRP
ORAL_STRIP | 1 refills | Status: DC
  Filled 2024-04-07: qty 100, 50d supply, fill #0
  Filled 2024-06-07: qty 100, 50d supply, fill #1

## 2024-04-07 MED ORDER — ROSUVASTATIN CALCIUM 20 MG PO TABS
20.0000 mg | ORAL_TABLET | Freq: Every day | ORAL | 3 refills | Status: AC
  Filled 2024-04-07: qty 90, 90d supply, fill #0
  Filled 2024-07-11: qty 90, 90d supply, fill #1

## 2024-04-12 DIAGNOSIS — M5416 Radiculopathy, lumbar region: Secondary | ICD-10-CM | POA: Diagnosis not present

## 2024-04-12 DIAGNOSIS — M5116 Intervertebral disc disorders with radiculopathy, lumbar region: Secondary | ICD-10-CM | POA: Diagnosis not present

## 2024-04-13 ENCOUNTER — Telehealth: Payer: Self-pay

## 2024-04-13 DIAGNOSIS — E118 Type 2 diabetes mellitus with unspecified complications: Secondary | ICD-10-CM

## 2024-04-13 NOTE — Telephone Encounter (Signed)
 Patient calls nurse line regarding elevated blood sugar reading this morning.   Reports reading of 313. States that this is fasting level. She is asymptomatic.   She states that she had steroid injection yesterday for her back with Dr. Colon.   Feels that she may need higher dosage than 8 units due to blood sugar level.   Advised that I would send message to PCP and will reach out to patient with update.   ED precautions discussed.   Chiquita JAYSON English, RN

## 2024-04-13 NOTE — Telephone Encounter (Addendum)
 Appeal in progress and faxed on 04/13/2024.

## 2024-04-13 NOTE — Telephone Encounter (Signed)
 Spoke with Dr. Koval prior to clinic PM closure.   He advised that patient could proceed with 8 units of Lantus  today. If blood sugar is greater than 200 tomorrow, patient can increase to 10 units.   While on the phone with patient she reports that she was feeling sweaty and shaky. Current CBG on 430.  Went to speak with Dr. McDiarmid who advised that she could take Lantus  at this time. If symptoms were to worsen, patient should be seen in ED.   When I reconnected with patient, she reports that shakiness has resolved.   Patient reports that she will take Lantus  now.   ED precautions discussed.   Scheduled for virtual visit with Dr. Koval on 04/15/24 for follow up.   Chiquita JAYSON English, RN

## 2024-04-14 ENCOUNTER — Encounter: Payer: Self-pay | Admitting: Obstetrics and Gynecology

## 2024-04-14 ENCOUNTER — Ambulatory Visit: Admitting: Obstetrics and Gynecology

## 2024-04-14 VITALS — BP 145/80 | HR 86

## 2024-04-14 DIAGNOSIS — N393 Stress incontinence (female) (male): Secondary | ICD-10-CM

## 2024-04-14 DIAGNOSIS — N3281 Overactive bladder: Secondary | ICD-10-CM

## 2024-04-14 DIAGNOSIS — R350 Frequency of micturition: Secondary | ICD-10-CM

## 2024-04-14 LAB — POCT URINALYSIS DIP (CLINITEK)
Bilirubin, UA: NEGATIVE
Blood, UA: NEGATIVE
Glucose, UA: 500 mg/dL — AB
Ketones, POC UA: NEGATIVE mg/dL
Leukocytes, UA: NEGATIVE
Nitrite, UA: NEGATIVE
POC PROTEIN,UA: NEGATIVE
Spec Grav, UA: 1.015 (ref 1.010–1.025)
Urobilinogen, UA: 0.2 U/dL
pH, UA: 7 (ref 5.0–8.0)

## 2024-04-14 MED ORDER — INSULIN GLARGINE 100 UNIT/ML SOLOSTAR PEN: 10.0000 [IU] | PEN_INJECTOR | SUBCUTANEOUS | 1 refills | Status: DC

## 2024-04-14 MED ORDER — LIDOCAINE HCL 2 % IJ SOLN
50.0000 mL | Freq: Once | INTRAMUSCULAR | Status: AC
  Administered 2024-04-14: 1000 mg

## 2024-04-14 MED ORDER — LIDOCAINE HCL URETHRAL/MUCOSAL 2 % EX GEL
1.0000 | Freq: Once | CUTANEOUS | Status: AC
Start: 2024-04-14 — End: 2024-04-14
  Administered 2024-04-14: 1 via URETHRAL

## 2024-04-14 MED ORDER — LIDOCAINE-EPINEPHRINE 1 %-1:100000 IJ SOLN
6.0000 mL | Freq: Once | INTRAMUSCULAR | Status: AC
  Administered 2024-04-14: 6 mL

## 2024-04-14 MED ORDER — ONABOTULINUMTOXINA 100 UNITS IJ SOLR
100.0000 [IU] | Freq: Once | INTRAMUSCULAR | Status: AC
  Administered 2024-04-14: 100 [IU] via INTRAMUSCULAR

## 2024-04-14 MED ORDER — LIDOCAINE HCL URETHRAL/MUCOSAL 2 % EX GEL: 1.0000 | Freq: Once | CUTANEOUS | Status: DC

## 2024-04-14 NOTE — Addendum Note (Signed)
 Addended by: Bilan Tedesco G on: 04/14/2024 05:13 PM   Modules accepted: Orders

## 2024-04-14 NOTE — Progress Notes (Signed)
   83 y.o. yo F with OAB and SUI presenting today for intravesical botox  and urethral bulking.   Vitals:   04/14/24 1004  BP: (!) 145/80  Pulse: 86   POC urine: negative  Intravesical Botox  Procedure: Prior to the procedure, the patient took ciprofloxacin  for antibiotic prophylaxis. Time out was performed. The bladder was catherized and 50 ml of 2% lidocaine  was placed in the bladder and 10 ml of 2% lidocaine  jelly placed in the urethra. A urethral block was performed by injecting 3ml of 1% lidocaine  with epinephrine  at 3 and 9 o'clock adjacent to the urethra. Cystoscopy was performed with sterile H2O and a 0 degree flexible scope. Bladder mucosa was noted to be within normal limits. A total of 10 ml / 100 units of Botox  A,  Lot # [id number] Exp 04/2026  was injected in the detrusor muscle via 5 injections of 2ml each. These were spaced about 1 cm apart. Care was taken to avoid the ureteral orifices and the trigone.    Bulkamid Injection The Bulkamid cystoscope was inserted to the level of the bladder neck.  The needle was inserted 2 cm and the scope was pulled back into the urethra 2 cm.  The needle was inserted bevel up at the 5 o'clock position and the Bulkamid was injected to obtain coaptation.  This was repeated at the 2 o'clock,  10 o'clock and 7 o'clock positions.   A total of 2- 1ml syringes were used and good circumferential coaptation was noted.  The patient tolerated the procedure well. She was asked to void after the procedure.  Post-Void Residual (PVR) by Bladder Scan: In order to evaluate bladder emptying, we discussed obtaining a postvoid residual and she agreed to this procedure.  Procedure: The ultrasound unit was placed on the patient's abdomen in the suprapubic region after the patient had voided. A PVR of 32 ml was obtained by bladder scan.   ASSESSMENT: 83 y.o. y.o. s/p intravesical botox  for OAB and transurethral Bulkamid injection for stress incontinence.    PLAN: Patient will follow up in 4 weeks to reassess. Voiding and post-procedure precautions were given. She will return for heavy bleeding, fevers, dysuria lasting beyond today and incomplete emptying.  All questions were answered.  Rosaline LOISE Caper, MD

## 2024-04-14 NOTE — Telephone Encounter (Signed)
 Patient with inadequate supply of insulin  to allow dose increase.   New prescription provided.   Lantus  (insulin  glargine) 10-20 units daily.  Plan to increase based on readings over the next several weeks.   Follow-up plan in the AM to discuss glucose control AND potential adjustment.

## 2024-04-14 NOTE — Addendum Note (Signed)
 Addended by: Abbigael Detlefsen N on: 04/14/2024 02:34 PM   Modules accepted: Orders

## 2024-04-14 NOTE — Telephone Encounter (Signed)
 Patient returns call to nurse line regarding issues with Lantus  pen.   She states that pen is only dialing up to 8 and that she was unable to administer increased dosage today.   She reports that last night CBG was 498. Fasting blood sugar this morning was 280, repeat at noon was 361. At this time, patient administered 8 units of Lantus .   Current CBG is 361. Patient is asymptomatic.   Patient is requesting that new insulin  pen be sent to [facility] pharmacy.   She is also asking if she should take another dose tonight due to continued elevated readings.   Will forward to PCP.   Chiquita JAYSON English, RN

## 2024-04-14 NOTE — Patient Instructions (Signed)
Taking Care of Yourself after Urodynamics, Cystoscopy, Bulkamid Injection, or Botox Injection   Drink plenty of water for a day or two following your procedure. Try to have about 8 ounces (one cup) at a time, and do this 6 times or more per day unless you have fluid restrictitons AVOID irritative beverages such as coffee, tea, soda, alcoholic or citrus drinks for a day or two, as this may cause burning with urination.  For the first 1-2 days after the procedure, your urine may be pink or red in color. You may have some blood in your urine as a normal side effect of the procedure. Large amounts of bleeding or difficulty urinating are NOT normal. Call the nurse line if this happens or go to the nearest Emergency Room if the bleeding is heavy or you cannot urinate at all and it is after hours. If you had a Bulkamid injection in the urethra and need to be catheterized, ask for a pediatric catheter to be used (size 10 or 12-French) so the material is not pushed out of place.   You may experience some discomfort or a burning sensation with urination after having this procedure. You can use over the counter Azo or pyridium to help with burning and follow the instructions on the packaging. If it does not improve within 1-2 days, or other symptoms appear (fever, chills, or difficulty urinating) call the office to speak to a nurse.  You may return to normal daily activities such as work, school, driving, exercising and housework on the day of the procedure.     

## 2024-04-14 NOTE — Addendum Note (Signed)
 Addended by: Karyn Brull C on: 04/14/2024 04:23 PM   Modules accepted: Orders

## 2024-04-15 ENCOUNTER — Encounter (HOSPITAL_BASED_OUTPATIENT_CLINIC_OR_DEPARTMENT_OTHER): Payer: Self-pay

## 2024-04-15 ENCOUNTER — Encounter: Payer: Self-pay | Admitting: Pharmacist

## 2024-04-15 ENCOUNTER — Other Ambulatory Visit: Payer: Self-pay

## 2024-04-15 ENCOUNTER — Ambulatory Visit: Admitting: Pharmacist

## 2024-04-15 DIAGNOSIS — R0789 Other chest pain: Secondary | ICD-10-CM | POA: Diagnosis not present

## 2024-04-15 DIAGNOSIS — R0602 Shortness of breath: Secondary | ICD-10-CM | POA: Diagnosis not present

## 2024-04-15 DIAGNOSIS — E118 Type 2 diabetes mellitus with unspecified complications: Secondary | ICD-10-CM

## 2024-04-15 DIAGNOSIS — I10 Essential (primary) hypertension: Secondary | ICD-10-CM | POA: Diagnosis not present

## 2024-04-15 DIAGNOSIS — Z79899 Other long term (current) drug therapy: Secondary | ICD-10-CM | POA: Diagnosis not present

## 2024-04-15 DIAGNOSIS — E1165 Type 2 diabetes mellitus with hyperglycemia: Secondary | ICD-10-CM | POA: Insufficient documentation

## 2024-04-15 DIAGNOSIS — Z794 Long term (current) use of insulin: Secondary | ICD-10-CM | POA: Diagnosis not present

## 2024-04-15 LAB — CBG MONITORING, ED: Glucose-Capillary: 358 mg/dL — ABNORMAL HIGH (ref 70–99)

## 2024-04-15 LAB — CBC
HCT: 40.1 % (ref 36.0–46.0)
Hemoglobin: 13.5 g/dL (ref 12.0–15.0)
MCH: 29.2 pg (ref 26.0–34.0)
MCHC: 33.7 g/dL (ref 30.0–36.0)
MCV: 86.8 fL (ref 80.0–100.0)
Platelets: 161 K/uL (ref 150–400)
RBC: 4.62 MIL/uL (ref 3.87–5.11)
RDW: 14.2 % (ref 11.5–15.5)
WBC: 7.2 K/uL (ref 4.0–10.5)
nRBC: 0 % (ref 0.0–0.2)

## 2024-04-15 LAB — BASIC METABOLIC PANEL WITH GFR
Anion gap: 14 (ref 5–15)
BUN: 38 mg/dL — ABNORMAL HIGH (ref 8–23)
CO2: 18 mmol/L — ABNORMAL LOW (ref 22–32)
Calcium: 10.1 mg/dL (ref 8.9–10.3)
Chloride: 100 mmol/L (ref 98–111)
Creatinine, Ser: 0.97 mg/dL (ref 0.44–1.00)
GFR, Estimated: 59 mL/min — ABNORMAL LOW (ref >60)
Glucose, Bld: 402 mg/dL — ABNORMAL HIGH (ref 70–99)
Potassium: 4.7 mmol/L (ref 3.5–5.1)
Sodium: 132 mmol/L — ABNORMAL LOW (ref 135–145)

## 2024-04-15 LAB — TROPONIN T, HIGH SENSITIVITY: Troponin T High Sensitivity: <15 ng/L (ref 0–19)

## 2024-04-15 NOTE — Progress Notes (Signed)
 Reviewed and agree with Dr Rennis plan.

## 2024-04-15 NOTE — Telephone Encounter (Signed)
 Reviewed and agree with Dr Rennis plan.

## 2024-04-15 NOTE — Assessment & Plan Note (Signed)
 Diabetes longstanding currently uncontrolled. Patient is able to verbalize appropriate hypoglycemia management plan. Medication adherence appears good. Control is suboptimal due to recent steroid injection. -Continued basal insulin  Lantus  (insulin  glargine) 10 units daily in the morning. Patient will titrate to 12 units every day if fasting blood sugar > 200mg /dl and to 8 units <849 mg/dL.  -plan to follow-up on sugars on monday -Patient educated on purpose, proper use, and potential adverse effects.  -Extensively discussed pathophysiology of diabetes, recommended lifestyle interventions, dietary effects on blood sugar control.  -Counseled on s/sx of and management of hypoglycemia.

## 2024-04-15 NOTE — Patient Instructions (Signed)
 It was nice to see you today!  Your goal blood sugar is 80-130 before eating and less than 180 after eating.  Medication Changes:  -Continued basal insulin  Lantus  (insulin  glargine) 10 units daily in the morning. Patient will titrate to 12 units every day if fasting blood sugar > 200mg /dl and to 8 units <849 mg/dL.  -Plan to follow-up on sugars on monday   Continue all other medication the same.

## 2024-04-15 NOTE — ED Triage Notes (Signed)
 Pt states that she had a steroid injection the other day for her back and now her CBG is in the 400's pt states that she is also having some chest heaviness and tightness.

## 2024-04-15 NOTE — Progress Notes (Signed)
    S:     Chief Complaint  Patient presents with   Medication Management    DM follow-up   83 y.o. female who presents for diabetes evaluation, education, and management. Patient sounds in good spirits and visit was conducted over the phone  Patient was referred and last seen by Primary Care Provider, Dr. Madelon, on 01/15/24.  Since last visit, recent steroid injection in back on Tuesday and has recent hyperglycemia. Likely steroid-induced hyperglycemia.  Hasn't taken insulin  for the day as yet. Knows to use 10 units today PMH is significant for diabetes.  Patient reports Diabetes was diagnosed in .   Current diabetes medications include: Lantus  (insulin  glargine) 10 units daily,   Current hyperlipidemia medications include: Rosuvastatin  20 mg daily  Patient reports adherence to taking all medications as prescribed.   Patient denies hypoglycemic events.  Reported home fasting blood sugars: 249 this morning  Reported 2 hour post-meal/random blood sugars: 498 was highest seen Eats breakfast at ~9 am in the morning  O:  Lab Results  Component Value Date   HGBA1C 7.8 (A) 11/23/2023   Lipid Panel     Component Value Date/Time   CHOL 158 02/28/2021 1402   TRIG 108 02/28/2021 1402   HDL 72 02/28/2021 1402   CHOLHDL 2.2 02/28/2021 1402   CHOLHDL 3.3 10/06/2014 1150   VLDL 43 (H) 10/06/2014 1150   LDLCALC 67 02/28/2021 1402   A/P: Diabetes longstanding currently uncontrolled. Patient is able to verbalize appropriate hypoglycemia management plan. Medication adherence appears good. Control is suboptimal due to recent steroid injection. -Continued basal insulin  Lantus  (insulin  glargine) 10 units daily in the morning. Patient will titrate to 12 units every day if fasting blood sugar > 200mg /dl and to 8 units <849 mg/dL.  -plan to follow-up on sugars on monday -Patient educated on purpose, proper use, and potential adverse effects.  -Extensively discussed pathophysiology of  diabetes, recommended lifestyle interventions, dietary effects on blood sugar control.  -Counseled on s/sx of and management of hypoglycemia.   Written patient instructions provided. Patient verbalized understanding of treatment plan.  Total time counseling 15 minutes.    Follow-up:  Pharmacist phone call on Monday @ 9 am Patient seen with Fonda Blase, PharmD Candidate - PY3 student and Estefana Blase, PharmD Candidate - PY4 student.

## 2024-04-16 ENCOUNTER — Emergency Department (HOSPITAL_BASED_OUTPATIENT_CLINIC_OR_DEPARTMENT_OTHER)

## 2024-04-16 ENCOUNTER — Emergency Department (HOSPITAL_BASED_OUTPATIENT_CLINIC_OR_DEPARTMENT_OTHER)
Admission: EM | Admit: 2024-04-16 | Discharge: 2024-04-16 | Disposition: A | Discharge status: Home / Self Care | Attending: Emergency Medicine | Admitting: Emergency Medicine

## 2024-04-16 DIAGNOSIS — R0789 Other chest pain: Secondary | ICD-10-CM

## 2024-04-16 DIAGNOSIS — K7689 Other specified diseases of liver: Secondary | ICD-10-CM | POA: Diagnosis not present

## 2024-04-16 DIAGNOSIS — J439 Emphysema, unspecified: Secondary | ICD-10-CM | POA: Diagnosis not present

## 2024-04-16 DIAGNOSIS — R739 Hyperglycemia, unspecified: Secondary | ICD-10-CM

## 2024-04-16 DIAGNOSIS — R7989 Other specified abnormal findings of blood chemistry: Secondary | ICD-10-CM | POA: Diagnosis not present

## 2024-04-16 LAB — I-STAT VENOUS BLOOD GAS, ED
Acid-base deficit: 1 mmol/L (ref 0.0–2.0)
Bicarbonate: 25.2 mmol/L (ref 20.0–28.0)
Calcium, Ion: 1.35 mmol/L (ref 1.15–1.40)
HCT: 42 % (ref 36.0–46.0)
Hemoglobin: 14.3 g/dL (ref 12.0–15.0)
O2 Saturation: 47 %
Patient temperature: 98.6
Potassium: 4.3 mmol/L (ref 3.5–5.1)
Sodium: 132 mmol/L — ABNORMAL LOW (ref 135–145)
TCO2: 27 mmol/L (ref 22–32)
pCO2, Ven: 48.9 mmHg (ref 44–60)
pH, Ven: 7.321 (ref 7.25–7.43)
pO2, Ven: 28 mmHg — CL (ref 32–45)

## 2024-04-16 LAB — TROPONIN T, HIGH SENSITIVITY
Troponin T High Sensitivity: <15 ng/L (ref 0–19)
Troponin T High Sensitivity: <15 ng/L (ref 0–19)

## 2024-04-16 LAB — BASIC METABOLIC PANEL WITH GFR
Anion gap: 14 (ref 5–15)
Anion gap: 13 (ref 5–15)
BUN: 35 mg/dL — ABNORMAL HIGH (ref 8–23)
BUN: 32 mg/dL — ABNORMAL HIGH (ref 8–23)
CO2: 22 mmol/L (ref 22–32)
CO2: 23 mmol/L (ref 22–32)
Calcium: 10.5 mg/dL — ABNORMAL HIGH (ref 8.9–10.3)
Calcium: 10 mg/dL (ref 8.9–10.3)
Chloride: 99 mmol/L (ref 98–111)
Chloride: 101 mmol/L (ref 98–111)
Creatinine, Ser: 0.92 mg/dL (ref 0.44–1.00)
Creatinine, Ser: 0.88 mg/dL (ref 0.44–1.00)
GFR, Estimated: >60 mL/min (ref >60)
GFR, Estimated: >60 mL/min (ref >60)
Glucose, Bld: 288 mg/dL — ABNORMAL HIGH (ref 70–99)
Glucose, Bld: 248 mg/dL — ABNORMAL HIGH (ref 70–99)
Potassium: 4.6 mmol/L (ref 3.5–5.1)
Potassium: 4.4 mmol/L (ref 3.5–5.1)
Sodium: 134 mmol/L — ABNORMAL LOW (ref 135–145)
Sodium: 137 mmol/L (ref 135–145)

## 2024-04-16 LAB — D-DIMER, QUANTITATIVE: D-Dimer, Quant: 0.81 ug{FEU}/mL — ABNORMAL HIGH (ref 0.00–0.50)

## 2024-04-16 LAB — LACTIC ACID, PLASMA: Lactic Acid, Venous: 1.1 mmol/L (ref 0.5–1.9)

## 2024-04-16 LAB — CBG MONITORING, ED
Glucose-Capillary: 260 mg/dL — ABNORMAL HIGH (ref 70–99)
Glucose-Capillary: 267 mg/dL — ABNORMAL HIGH (ref 70–99)

## 2024-04-16 LAB — BETA-HYDROXYBUTYRIC ACID: Beta-Hydroxybutyric Acid: 0.13 mmol/L (ref 0.05–0.27)

## 2024-04-16 MED ORDER — ALUM & MAG HYDROXIDE-SIMETH 200-200-20 MG/5ML PO SUSP: 30.0000 mL | Freq: Once | ORAL | Status: DC

## 2024-04-16 MED ORDER — LACTATED RINGERS IV BOLUS
1000.0000 mL | Freq: Once | INTRAVENOUS | Status: AC
  Administered 2024-04-16: 1000 mL via INTRAVENOUS

## 2024-04-16 MED ORDER — LIDOCAINE VISCOUS HCL 2 % MT SOLN: 15.0000 mL | Freq: Once | OROMUCOSAL | Status: DC

## 2024-04-16 MED ORDER — IOHEXOL 350 MG/ML SOLN
75.0000 mL | Freq: Once | INTRAVENOUS | Status: AC | PRN
  Administered 2024-04-16: 75 mL via INTRAVENOUS

## 2024-04-16 NOTE — ED Provider Notes (Signed)
 [facility] DEPARTMENT AT [facility] Provider Note   CSN: [id number] Arrival date & time: 04/15/24  2224     Patient presents with: Hyperglycemia and Chest Pain   [name] is a 83 y.o. female.   Patient with a history of hypertension, diabetes, migraine, chronic back pain here with hyperglycemia.  States she received an epidural injection and steroid shot by her PCP 3 days ago for back pain.  She was given prescription for Lantus  for steroid-induced hyperglycemia since then. She does not normally use insulin  or any pills for her diabetes. She took 8 units of Lantus  yesterday and then 10 units of Lantus  the morning of September 5.  She comes in tonight because her blood sugar was 456.  She denies any fever, chills, nausea, vomiting, abdominal pain.  She does endorse chest pressure that has been constant since 9 AM in the center of her chest without radiation.  No radiation of the pain to her arms, neck or back.  Denies any cardiac history.  Denies any history of blood clots.  Denies any history of CHF.  She has never had this chest pressure before but reports has been constant since 9 AM.  Also with shortness of breath and she has been waiting.  Denies cough or fever.  She been waiting for about 5 hours and has gotten progressively more short of breath since she was in waiting.  No cough or fever.  No history of asthma or CHF.  The history is provided by the patient.  Hyperglycemia Associated symptoms: chest pain and shortness of breath   Associated symptoms: no abdominal pain, no dizziness, no dysuria, no fever, no nausea, no vomiting and no weakness   Chest Pain Associated symptoms: shortness of breath   Associated symptoms: no abdominal pain, no cough, no dizziness, no fever, no headache, no nausea, no vomiting and no weakness        Prior to Admission medications   Medication Sig Start Date End Date Taking? Authorizing Provider  Artificial Tear Ointment (DRY  EYES OP) Apply 1 drop to eye 2 (two) times daily as needed (dry eyes).    [provider]  benzonatate  (TESSALON ) 100 MG capsule Take 1 capsule (100 mg total) by mouth 2 (two) times daily as needed for cough. 12/11/23   Rumball, Alison M, DO  busPIRone  (BUSPAR ) 10 MG tablet Take 1 tablet (10 mg total) by mouth 3 (three) times daily. 12/31/23   Rumball, Alison M, DO  clobetasol  ointment (TEMOVATE ) 0.05 % Apply topically to vulva twice a week. 01/19/24   Marilynne Rosaline SAILOR, MD  DULoxetine  (CYMBALTA ) 60 MG capsule Take 1 capsule (60 mg total) by mouth daily. 08/28/23   Raulkar, Sven SQUIBB, MD  DULoxetine  (CYMBALTA ) 60 MG capsule Take 1 capsule (60 mg total) by mouth daily. 12/10/23   Raulkar, Sven SQUIBB, MD  enalapril  (VASOTEC ) 10 MG tablet Take 1 tablet (10 mg total) by mouth daily. 07/01/23   Rumball, Alison M, DO  estradiol  (ESTRACE ) 0.1 MG/GM vaginal cream Place 0.5g nightly for two weeks then twice a week after 03/13/22   Marilynne Rosaline SAILOR, MD  estradiol  (ESTRACE ) 0.1 MG/GM vaginal cream Place 0.5 g vaginally 2 (two) times a week. 01/21/24   Marilynne Rosaline SAILOR, MD  famotidine  (PEPCID ) 20 MG tablet Take 1 tablet (20 mg total) by mouth 2 (two) times daily. Patient not taking: Reported on 04/04/2024 03/08/24     famotidine  (PEPCID ) 40 MG tablet Take 1 tablet (40 mg  total) by mouth daily. 02/05/24   Rumball, Alison M, DO  fexofenadine  (ALLEGRA ) 180 MG tablet Take 1 tablet (180 mg total) by mouth daily. 08/18/22   Scarlet Elsie LABOR, MD  glucose blood (ONETOUCH VERIO) test strip USE 1 STRIP TO TEST 2 TIMES DAILY AS DIRECTED 04/07/24   Rumball, Alison M, DO  insulin  glargine (LANTUS ) 100 UNIT/ML Solostar Pen Inject 10-20 Units into the skin every morning. As instructed by provider 04/14/24   McDiarmid, Krystal BIRCH, MD  Lancets [facility] DELICA PLUS LANCET33G) MISC USE TO CHECK GLUCOSE IN THE MORNING AND AT BEDTIME. E11.9 02/06/23   Donah Laymon PARAS, MD  levocetirizine (XYZAL ) 5 MG tablet Take 1 tablet (5 mg  total) by mouth at bedtime. 01/26/24     levocetirizine (XYZAL ) 5 MG tablet Take 1 tablet (5 mg total) by mouth 2 (two) times daily. 11/04/23     lidocaine  (LIDODERM ) 5 % Apply 1 patch onto the skin every 12 (twelve) hours. 07/09/23   Madelon Donald HERO, DO  lidocaine  (XYLOCAINE ) 5 % ointment Apply to affected area 3 to 4 times daily, not to exceed 5 g of ointment in single application. 08/28/23   Raulkar, Sven SQUIBB, MD  lidocaine  (XYLOCAINE ) 5 % ointment Apply to affected area(s) 3 to 4 times daily-Do not exceed 5 grams of ointment in a single application. 12/10/23   Raulkar, Sven SQUIBB, MD  Multiple Vitamin (MULTIVITAMIN WITH MINERALS) TABS tablet Take 1 tablet by mouth daily.    [provider]  Multiple Vitamins-Minerals (ICAPS AREDS 2 PO) Take 1 tablet by mouth in the morning and at bedtime.    [provider]  ondansetron  (ZOFRAN -ODT) 4 MG disintegrating tablet Take 1 tablet (4 mg total) by mouth every 8 (eight) hours as needed for nausea or vomiting. 12/11/23   Rumball, Alison M, DO  oxyCODONE -acetaminophen  (PERCOCET) 10-325 MG tablet Take 1 tablet by mouth every 4 (four) hours as needed for pain. 04/04/24   Debby Fidela CROME, NP  pantoprazole  (PROTONIX ) 40 MG tablet Take 1 tablet (40 mg total) by mouth daily. 08/28/23   Rumball, Alison M, DO  pregabalin  (LYRICA ) 50 MG capsule Take 1 capsule (50 mg total) by mouth at bedtime as needed. 12/10/23   Raulkar, Sven SQUIBB, MD  rosuvastatin  (CRESTOR ) 20 MG tablet Take 1 tablet (20 mg total) by mouth daily. 04/07/24   Madelon Donald HERO, DO  Suzetrigine  (JOURNAVX ) 50 MG TABS Take 1 tablet by mouth 2 (two) times daily as needed. 02/23/24   Raulkar, Sven SQUIBB, MD  triamcinolone  ointment (KENALOG ) 0.5 % Apply 1 application topically 2 (two) times daily. For elbows Patient taking differently: Apply 1 application  topically 2 (two) times daily as needed (elbow itching). 10/10/19   Scarlet Elsie LABOR, MD    Allergies: Amoxicillin, Azithromycin , Butalbital,  Clindamycin  hcl, Januvia  [sitagliptin ], Levaquin  [levofloxacin  in d5w], Meperidine hcl, Sulfamethoxazole, Fexofenadine  hcl, Ketorolac , Macrodantin  [nitrofurantoin  macrocrystal], Methocarbamol , Nitrofurantoin , Pioglitazone , Tramadol , Codeine phosphate, Etodolac, Gabapentin , Naproxen, Pentazocine-naloxone  hcl, Sumatriptan, Trimethoprim , and Zonegran    Review of Systems  Constitutional:  Negative for activity change, appetite change and fever.  HENT:  Negative for congestion and rhinorrhea.   Respiratory:  Positive for chest tightness and shortness of breath. Negative for cough.   Cardiovascular:  Positive for chest pain.  Gastrointestinal:  Negative for abdominal pain, nausea and vomiting.  Genitourinary:  Negative for dysuria and hematuria.  Musculoskeletal:  Negative for arthralgias and myalgias.  Skin:  Negative for rash.  Neurological:  Negative for dizziness, weakness and  headaches.   all other systems are negative except as noted in the HPI and PMH.    Updated Vital Signs BP (!) 172/95 (BP Location: Right Arm)   Pulse 95   Temp 98.2 F (36.8 C) (Oral)   Resp 20   SpO2 94%   Physical Exam Vitals and nursing note reviewed.  Constitutional:      General: She is not in acute distress.    Appearance: She is well-developed.  HENT:     Head: Normocephalic and atraumatic.     Mouth/Throat:     Pharynx: No oropharyngeal exudate.  Eyes:     Conjunctiva/sclera: Conjunctivae normal.     Pupils: Pupils are equal, round, and reactive to light.  Neck:     Comments: No meningismus. Cardiovascular:     Rate and Rhythm: Normal rate and regular rhythm.     Heart sounds: Normal heart sounds. No murmur heard. Pulmonary:     Effort: Pulmonary effort is normal. No respiratory distress.     Breath sounds: Normal breath sounds.  Abdominal:     Palpations: Abdomen is soft.     Tenderness: There is no abdominal tenderness. There is no guarding or rebound.  Musculoskeletal:        General: No  tenderness. Normal range of motion.     Cervical back: Normal range of motion and neck supple.  Skin:    General: Skin is warm.  Neurological:     Mental Status: She is alert and oriented to person, place, and time.     Cranial Nerves: No cranial nerve deficit.     Motor: No abnormal muscle tone.     Coordination: Coordination normal.     Comments:  5/5 strength throughout. CN 2-12 intact.Equal grip strength.   Psychiatric:        Behavior: Behavior normal.     (all labs ordered are listed, but only abnormal results are displayed) Labs Reviewed  BASIC METABOLIC PANEL WITH GFR - Abnormal; Notable for the following components:      Result Value   Sodium 132 (*)    CO2 18 (*)    Glucose, Bld 402 (*)    BUN 38 (*)    GFR, Estimated 59 (*)    All other components within normal limits  D-DIMER, QUANTITATIVE - Abnormal; Notable for the following components:   D-Dimer, Quant 0.81 (*)    All other components within normal limits  BASIC METABOLIC PANEL WITH GFR - Abnormal; Notable for the following components:   Sodium 134 (*)    Glucose, Bld 288 (*)    BUN 35 (*)    Calcium  10.5 (*)    All other components within normal limits  BASIC METABOLIC PANEL WITH GFR - Abnormal; Notable for the following components:   Glucose, Bld 248 (*)    BUN 32 (*)    All other components within normal limits  CBG MONITORING, ED - Abnormal; Notable for the following components:   Glucose-Capillary 358 (*)    All other components within normal limits  I-STAT VENOUS BLOOD GAS, ED - Abnormal; Notable for the following components:   pO2, Ven 28 (*)    Sodium 132 (*)    All other components within normal limits  CBG MONITORING, ED - Abnormal; Notable for the following components:   Glucose-Capillary 260 (*)    All other components within normal limits  CBG MONITORING, ED - Abnormal; Notable for the following components:   Glucose-Capillary 267 (*)  All other components within normal limits  CBC   LACTIC ACID, PLASMA  BETA-HYDROXYBUTYRIC ACID  TROPONIN T, HIGH SENSITIVITY  TROPONIN T, HIGH SENSITIVITY  TROPONIN T, HIGH SENSITIVITY    EKG: None  Radiology: DG Chest Port 1 View Result Date: 04/16/2024 EXAM: 1 VIEW XRAY OF THE CHEST 04/16/2024 03:25:00 AM COMPARISON: 06/19/2023 CLINICAL HISTORY: Pt states that she had a steroid injection the other day for her back and now her CBG is in the 400's pt states that she is also having some chest heaviness and tightness. FINDINGS: LUNGS AND PLEURA: No focal pulmonary opacity. No pulmonary edema. No pleural effusion. No pneumothorax. HEART AND MEDIASTINUM: No acute abnormality of the cardiac and mediastinal silhouettes. Atherosclerotic plaque noted. BONES AND SOFT TISSUES: No acute osseous abnormality. IMPRESSION: 1. No acute process. Electronically signed by: Norman Gatlin MD 04/16/2024 03:39 AM EDT RP Workstation: [id number]     Procedures   Medications Ordered in the ED  lactated ringers  bolus 1,000 mL (1,000 mLs Intravenous New Bag/Given 04/16/24 0403)                                    Medical Decision Making Amount and/or Complexity of Data Reviewed Labs: ordered. Decision-making details documented in ED Course. Radiology: ordered and independent interpretation performed. Decision-making details documented in ED Course. ECG/medicine tests: ordered and independent interpretation performed. Decision-making details documented in ED Course.  Risk Prescription drug management.   Hyperglycemia with recent steroid shot.  Vitals are stable.  No distress.  Abdomen soft without peritoneal signs.  EKG is sinus rhythm without acute ST changes. Stable inferior and anterior Q waves.  Chest pressure 9am >12 hours since arrival to the ED.  Patient given IV fluids.  Labs show hyperglycemia with normal anion gap. CXR negative for edema or infiltrate. Results reviewed and interpreted by me.   Blood sugar improving to 200s with IVF. Not in  DKA. Troponin is negative x2 in setting of constant chest pressure since 9am >12 hours since arrival to the ED. Does not appear consistent with ACS. GI cocktail given.  D-dimer elevated in setting of chest pressure and SOB.   CTPE pending at shift change.  Anticipate discharge home if reassuring with PCP and cardiology followup. Dr. Jerrol to assume care.      Final diagnoses:  Hyperglycemia  Atypical chest pain    ED Discharge Orders     None          Amzie Sillas, Garnette, MD 04/16/24 1605

## 2024-04-16 NOTE — Discharge Instructions (Addendum)
 Take your insulin  as prescribed and follow-up with your doctor.  As we discussed you are low risk for heart disease but nonzero risk.  Follow-up with your doctor for a stress test.  Return to the ED if you develop exertional chest pain, pressure, shortness of breath, nausea, vomiting, diaphoresis or other concerns.

## 2024-04-16 NOTE — ED Notes (Signed)
 ED Provider at bedside.

## 2024-04-16 NOTE — ED Notes (Signed)

## 2024-04-16 NOTE — ED Provider Notes (Signed)
  Physical Exam  BP (!) 170/78   Pulse 67   Temp 98 F (36.7 C)   Resp 18   SpO2 94%   Physical Exam  Procedures  Procedures  ED Course / MDM    Medical Decision Making Amount and/or Complexity of Data Reviewed Labs: ordered. Radiology: ordered.  Risk OTC drugs. Prescription drug management.   34F presenting with hyperglycemia, better after fluids. Endorsed CP and pressure since 9am yesterday. Trops x2 normal, EKG looks OK, waiting on CTA PE study. No known cardiac hx. Currently CP free.   CTA PE: IMPRESSION:  1. No CT evidence for acute pulmonary embolus.  2. No other acute findings to explain the patient's history of chest  heaviness and tightness.  3. Subtle nodularity of liver contour raises the question of  cirrhosis.  4. Aortic Atherosclerosis (ICD10-I70.0) and Emphysema (ICD10-J43.9).   Patient reassessed, feeling symptomatically improved, blood glucose has been downtrending to 248 and the patient is not in DKA.  Cardiac troponins were normal, beta hydroxybutyrate was also normal.  CTA PE study was overall unremarkable.  Patient on reassessment feels symptomatically improved after IV fluids.  Advised that she follow-up closely with her primary care provider, low concern for ACS, other intrathoracic emergency at that time.  Patient overall stable for discharge and outpatient follow-up.    Jerrol Agent, MD 04/16/24 [phone]

## 2024-04-16 NOTE — ED Notes (Signed)
 Called pts daughter to pick up per pt request.

## 2024-04-18 ENCOUNTER — Encounter: Payer: Self-pay | Admitting: Pharmacist

## 2024-04-18 ENCOUNTER — Telehealth: Payer: Self-pay | Admitting: Pharmacist

## 2024-04-18 NOTE — Progress Notes (Signed)
 Chart accessed for medication adherence.   Patient picked up rosuvastatin  20 mg #90 on 08/28

## 2024-04-18 NOTE — Telephone Encounter (Signed)
 Patient contacted for follow-up of glucose control post-steroid injection.   Since last contact patient reports she went to the ER on Friday and stayed overnight for work-up.  She shared a list of multiple interventions including CT scan.  She was administered IV fluids which helped decrease her glucose values AND was sent home Saturday AM from the ER.   She reports taking Lantus  (insulin  glargine) 12 units Saturday AM , subsequently had a fasting glucose of 227 on Sunday AM, then took 12 units - had a later day glucose value of 327.   During call this AM she measured her glucose values and it was 221.   After an extended discussion of dose adjustments and causes for glucose elevations, we agreed to continue 12 units each morning until her glucose value decreased to < 150   Plan to decrease her Lantus  (insulin  glargine) back to her previous 6 units in the AM over the next few days/week   Total time with patient call and documentation of interaction: 17 minutes.  F/U Phone call planned: WED AM prior to 9:00 AM

## 2024-04-18 NOTE — Telephone Encounter (Signed)
 Reviewed and agree with Dr Rennis plan.

## 2024-04-20 ENCOUNTER — Telehealth: Payer: Self-pay | Admitting: Pharmacist

## 2024-04-20 NOTE — Telephone Encounter (Signed)
 Reviewed and agree with Dr Rennis plan.

## 2024-04-20 NOTE — Telephone Encounter (Signed)
 Patient contacted for follow-up of blood sugars following steroid shot  Since last contact patient reports FBG of 198 today, 220-224 yesterday   Medication Plan: -If FBG are <200 mg for another day, adjust Lantus  (insulin  glargine) to 10 units daily -If FBG <150-159 adjust Lantus  (insulin  glargine) to 8 units daily -Plan to adjust to 6 units in the future  Total time with patient call and documentation of interaction: 5 minutes.  F/U Phone call planned: Monday 04/25/24

## 2024-04-21 ENCOUNTER — Other Ambulatory Visit: Payer: Self-pay

## 2024-04-21 ENCOUNTER — Other Ambulatory Visit (HOSPITAL_COMMUNITY): Payer: Self-pay

## 2024-04-22 ENCOUNTER — Telehealth: Payer: Self-pay

## 2024-04-22 ENCOUNTER — Other Ambulatory Visit (HOSPITAL_COMMUNITY): Payer: Self-pay

## 2024-04-22 MED ORDER — ONDANSETRON 4 MG PO TBDP
4.0000 mg | ORAL_TABLET | Freq: Three times a day (TID) | ORAL | 0 refills | Status: DC | PRN
  Filled 2024-04-22: qty 12, 4d supply, fill #0

## 2024-04-22 NOTE — Telephone Encounter (Signed)
 Reviewed and agree with Dr Rennis plan.

## 2024-04-22 NOTE — Telephone Encounter (Signed)
 Patient returns call to nurse line regarding next steps.   Patient reports that she is waiting to administer Lantus  until hearing from Dr. Koval, as she wants to ensure she is taking correct dosage.   She is still asymptomatic.   Debra JAYSON English, RN

## 2024-04-22 NOTE — Telephone Encounter (Signed)
 Patient calls nurse line requesting to speak with Koval.  She reports she was told his next phone call to her would be on Monday to check in.   She reports last night her home cbg was 515. She reports taking 10 units of Lantus  as instructed yesterday morning.   She reports fasting cbg this morning was 205.  She reports no symptoms this morning and she feels fine.   She reports she would like a phone call from Koval again today.   She reports she would prefer to wait to take morning insulin  until she speaks with Koval.   Will forward to Dr. Koval.

## 2024-04-22 NOTE — Telephone Encounter (Signed)
 Patient contacted for follow-up of glucose control post steroid injection.    Since last contact patient reports she took 10 units of insulin  yesterday after she had an AM fasting < 200 as we discussed.  She also reported having an evening glucose value of 515.   She denies taking any insulin  yet today. Her morning fasting today was in the low 200s.   I advised her to increase back to 12 units daily today, Saturday and Sunday.  I asked her to lower the dose if her glucose was < 150 fasting.  Suggested dose 10 units.   Also requested refill of ondansetron  - provided.   I will plan to call her again on Monday AM  Total time with patient call and documentation of interaction: 13 minutes.  F/U Phone call planned: 9/15 at 9:00 AM

## 2024-04-25 ENCOUNTER — Telehealth: Payer: Self-pay | Admitting: Pharmacist

## 2024-04-25 NOTE — Telephone Encounter (Signed)
 Patient contacts office to report glucose readings.  Patient reports fasting glucose  9/12 = 205 9/13 = 210 9/14 = 194 with later day glucose 282 9/15 = 207 (today)  Patient advised to continue 12 units this AM and tomorrow.  We plan to talk Wed AM prior to injection.  She was encouraged to call if glucose readings were concerning (low < 125) prior to injection tomorrow.  We discussed anticipation of steroid effect wearing off after two weeks (during the next 7 days).   Total time with patient call and documentation of interaction: 11 minutes.

## 2024-04-25 NOTE — Telephone Encounter (Signed)
 Reviewed and agree with Dr Rennis plan.

## 2024-04-25 NOTE — Telephone Encounter (Signed)
 Attempted to contact patient for follow-up of glucose control.    Left HIPAA compliant voice mail requesting call back to direct phone: [phone] Total time with patient call and documentation of interaction: 3 minutes.  Follow-up phone call planned: later in the AM.

## 2024-04-27 ENCOUNTER — Telehealth: Payer: Self-pay | Admitting: Pharmacist

## 2024-04-27 NOTE — Telephone Encounter (Signed)
 Patient contacted for follow-up of insulin  regimen. Since last contact patient reports BG on 09/16 AM of 200 mg/dL, BG on 90/82 AM of 784 mg/dL.   Plan to continue on insulin  regimen of Lantus  (insulin  glargine) 12 units daily.   Total time with patient call and documentation of interaction: 4 minutes.  F/U Phone call planned: Friday 09/19 to address insulin  regimen over weekend

## 2024-04-27 NOTE — Telephone Encounter (Signed)
 Reviewed and agree with Dr Rennis plan.

## 2024-04-28 ENCOUNTER — Other Ambulatory Visit: Payer: Self-pay

## 2024-04-28 ENCOUNTER — Other Ambulatory Visit (HOSPITAL_COMMUNITY): Payer: Self-pay

## 2024-04-29 ENCOUNTER — Telehealth: Payer: Self-pay | Admitting: Pharmacist

## 2024-04-29 NOTE — Telephone Encounter (Signed)
 Patient contacted for follow-up of Diabetes control  Since last contact patient reports Fasting glucose of 173 yesterday followed by a late day reading of 343.  She had a glucose reading of 203 this AM.   Following some discussion, we agreed to maintain 12 units QAM over the next three days.     Total time with patient call and documentation of interaction: 11 minutes.  F/U Phone call planned: TUESDAY  9/23

## 2024-04-29 NOTE — Telephone Encounter (Signed)
 Reviewed and agree with Dr Rennis plan.

## 2024-05-03 NOTE — Telephone Encounter (Signed)
 Patient contacted for follow-up of Diabetes - post steroid injection  Since last contact patient reports glucose values continue to decrease.  Reports AM fasting of 170, 138 and 171.  She reports taking 12 units of Lantus  (insulin  glargine) in the AM however she did reduce to 10 units on the AM of the 138.  She reports taking 8 units of Lantus  (insulin  glargine) prior to the steroid injection.  Patient denies any significant medication related side effects.  Medication Plan: - Reduce Lantus  (insulin  glargine) from 12 to 10 units once daily.  Instructed to call for low or concerning readings.  I will plan to call her on 9/26 to assess control.   Total time with patient call and documentation of interaction: 8 minutes.

## 2024-05-03 NOTE — Telephone Encounter (Signed)
 Patient [name] on nurse line regarding follow up with Dr. Koval.   Patient states that she was supposed to speak with Dr. Koval around 9 am this morning.   She is asking if Dr. Koval can return call to her for follow up on continuing insulin  and dosage.   Please return call at [phone].  Chiquita JAYSON English, RN

## 2024-05-05 NOTE — Telephone Encounter (Signed)
 Reviewed and agree with Dr Rennis plan.

## 2024-05-06 ENCOUNTER — Other Ambulatory Visit: Payer: Self-pay

## 2024-05-06 ENCOUNTER — Telehealth: Payer: Self-pay | Admitting: Pharmacist

## 2024-05-06 ENCOUNTER — Other Ambulatory Visit (HOSPITAL_COMMUNITY): Payer: Self-pay

## 2024-05-06 DIAGNOSIS — E118 Type 2 diabetes mellitus with unspecified complications: Secondary | ICD-10-CM

## 2024-05-06 MED ORDER — INSULIN GLARGINE 100 UNIT/ML SOLOSTAR PEN
8.0000 [IU] | PEN_INJECTOR | SUBCUTANEOUS | 1 refills | Status: DC
  Filled 2024-05-06: qty 15, 187d supply, fill #0

## 2024-05-06 NOTE — Telephone Encounter (Signed)
-----   Message from Maude Lagos sent at 05/03/2024  1:47 PM EDT ----- Regarding: FW: Glucose - insulin  adjust ment  ----- Message ----- From: [place] Maude MATSU, RPH-CPP Sent: 05/03/2024   1:47 PM EDT To: Maude MATSU Lagos, RPH-CPP Subject: FW: Glucose - insulin  adjust ment               ----- Message ----- From: [place] Maude MATSU, RPH-CPP Sent: 05/03/2024  12:00 AM EDT To: Maude MATSU Lagos, RPH-CPP Subject: FW: Glucose - insulin  adjust ment               ----- Message ----- From: [place] Maude MATSU, RPH-CPP Sent: 04/29/2024  12:00 AM EDT To: Maude MATSU Lagos, RPH-CPP Subject: FW: Glucose - insulin  adjust ment               ----- Message ----- From: Jaymes Revels G, RPH-CPP Sent: 04/27/2024  12:00 AM EDT To: Maude MATSU Lagos, RPH-CPP Subject: Glucose - insulin  adjust ment

## 2024-05-06 NOTE — Telephone Encounter (Signed)
 Patient contacted for follow-up of glucose control.   Since last contact patient reports fasting AM glucose readings of 171, 113, 140, 205 and has self reduced back to PRE- STEROID injection dose of 8 units each AM.   Current Medications include: Lantus  (insulin  glargine) at 8 units daily in AM Patient denies any significant medication related side effects.  Medication Plan: - Continue Lantus  (insulin  glargine) 8 units daily in the AM  - Asked to follow-up next with Dr. Madelon  Total time with patient call and documentation of interaction: 9 minutes.  F/U Phone call planned: No additional follow-up planned for glucose/insulin  management as steroid effect is now resolved.

## 2024-05-06 NOTE — Telephone Encounter (Signed)
 Reviewed and agree with Dr Rennis plan.

## 2024-05-11 ENCOUNTER — Other Ambulatory Visit (HOSPITAL_COMMUNITY)
Admission: RE | Admit: 2024-05-11 | Discharge: 2024-05-11 | Disposition: A | Discharge status: Home / Self Care | Source: Other Acute Inpatient Hospital | Attending: Obstetrics and Gynecology | Admitting: Obstetrics and Gynecology

## 2024-05-11 ENCOUNTER — Ambulatory Visit: Admitting: Obstetrics and Gynecology

## 2024-05-11 ENCOUNTER — Other Ambulatory Visit (HOSPITAL_COMMUNITY)
Admission: RE | Admit: 2024-05-11 | Discharge: 2024-05-11 | Disposition: A | Discharge status: Home / Self Care | Source: Ambulatory Visit | Attending: Obstetrics and Gynecology | Admitting: Obstetrics and Gynecology

## 2024-05-11 ENCOUNTER — Encounter: Payer: Self-pay | Admitting: Obstetrics and Gynecology

## 2024-05-11 DIAGNOSIS — R319 Hematuria, unspecified: Secondary | ICD-10-CM

## 2024-05-11 DIAGNOSIS — R82998 Other abnormal findings in urine: Secondary | ICD-10-CM | POA: Diagnosis not present

## 2024-05-11 DIAGNOSIS — N898 Other specified noninflammatory disorders of vagina: Secondary | ICD-10-CM | POA: Diagnosis not present

## 2024-05-11 DIAGNOSIS — R350 Frequency of micturition: Secondary | ICD-10-CM

## 2024-05-11 DIAGNOSIS — N3281 Overactive bladder: Secondary | ICD-10-CM

## 2024-05-11 LAB — URINALYSIS, ROUTINE W REFLEX MICROSCOPIC
Bilirubin Urine: NEGATIVE
Glucose, UA: >=500 mg/dL — AB
Hgb urine dipstick: NEGATIVE
Ketones, ur: NEGATIVE mg/dL
Nitrite: NEGATIVE
Protein, ur: NEGATIVE mg/dL
Specific Gravity, Urine: 1.012 (ref 1.005–1.030)
WBC, UA: >50 WBC/hpf (ref 0–5)
pH: 6 (ref 5.0–8.0)

## 2024-05-11 LAB — POCT URINALYSIS DIP (CLINITEK)
Bilirubin, UA: NEGATIVE
Glucose, UA: NEGATIVE mg/dL
Ketones, POC UA: NEGATIVE mg/dL
Nitrite, UA: NEGATIVE
POC PROTEIN,UA: NEGATIVE
Spec Grav, UA: 1.015 (ref 1.010–1.025)
Urobilinogen, UA: 0.2 U/dL
pH, UA: 6.5 (ref 5.0–8.0)

## 2024-05-11 NOTE — Progress Notes (Signed)
 [facility] Return Visit  SUBJECTIVE  History of Present Illness: [name] is a 83 y.o. female seen in follow-up after botox  and urethral bulking on 04/14/24.   At night, she feels a pressure and has to get up to urinate 1-2 times per night. She does leak a lot when she does leak a night, but does not happen every time. No leakage during the day. Denies leaking with cough and sneeze.   Has some irritation at the vaginal opening with urination. She is not using the estradiol  regularly.    Past Medical History: Patient  has a past medical history of Asthma, Depression, Diabetes mellitus without complication (HCC), History of surgery on arm, Hypertension, Macular degeneration, Migraine, Pneumonia, and Urinary incontinence.   Past Surgical History: She  has a past surgical history that includes Appendectomy; Arm surgery; Breast cyst aspiration (12/20/2013); Breast biopsy (Right, 10/10/2009); Breast excisional biopsy (Left, 1998); Breast excisional biopsy (Right, 1993); Abdominal hysterectomy; IR US  Guide Vasc Access Right (04/17/2021); IR Angiogram Visceral Selective (04/17/2021); IR EMBO ART  VEN HEMORR LYMPH EXTRAV  INC GUIDE ROADMAPPING (04/17/2021); IR Angiogram Selective Each Additional Vessel (04/17/2021); Radiology with anesthesia (N/A, 04/17/2021); Esophagogastroduodenoscopy (N/A, 04/17/2021); Esophagogastroduodenoscopy (N/A, 04/18/2021); submucosal injection (04/18/2021); Hot hemostasis (N/A, 04/18/2021); and Breast biopsy (Right, 09/17/2022).   Medications: She has a current medication list which includes the following prescription(s): artificial tear ointment, benzonatate , buspirone , clobetasol  ointment, duloxetine , duloxetine , enalapril , estradiol , estradiol , famotidine , famotidine , onetouch verio, insulin  glargine, onetouch delica plus lancet33g, levocetirizine, levocetirizine, lidocaine , lidocaine , lidocaine , multivitamin with minerals, multiple vitamins-minerals, ondansetron ,  oxycodone -acetaminophen , pantoprazole , rosuvastatin , journavx , and triamcinolone  ointment, and the following Facility-Administered Medications: lidocaine .   Allergies: Patient is allergic to amoxicillin, azithromycin , butalbital, clindamycin  hcl, januvia  [sitagliptin ], levaquin  [levofloxacin  in d5w], meperidine hcl, sulfamethoxazole, fexofenadine  hcl, ketorolac , macrodantin  [nitrofurantoin  macrocrystal], methocarbamol , nitrofurantoin , pioglitazone , tramadol , codeine phosphate, etodolac, gabapentin , naproxen, pentazocine-naloxone  hcl, sumatriptan, trimethoprim , and zonegran.   Social History: Patient  reports that she quit smoking about 33 years ago. Her smoking use included cigarettes. She started smoking about 66 years ago. She has a 67.3 pack-year smoking history. She has never used smokeless tobacco. She reports that she does not drink alcohol and does not use drugs.     OBJECTIVE     Physical Exam: There were no vitals filed for this visit. Gen: No apparent distress, A&O x 3.  Detailed Urogynecologic Evaluation:  On speculum, minimal vaginal discharge present, aptima obtained. Excoriations on external vulva.    Results for orders placed or performed in visit on 05/11/24  POCT URINALYSIS DIP (CLINITEK)   Collection Time: 05/11/24  3:15 PM  Result Value Ref Range   Color, UA yellow yellow   Clarity, UA cloudy (A) clear   Glucose, UA negative negative mg/dL   Bilirubin, UA negative negative   Ketones, POC UA negative negative mg/dL   Spec Grav, UA 8.984 8.989 - 1.025   Blood, UA trace-intact (A) negative   pH, UA 6.5 5.0 - 8.0   POC PROTEIN,UA negative negative, trace   Urobilinogen, UA 0.2 0.2 or 1.0 E.U./dL   Nitrite, UA Negative Negative   Leukocytes, UA Small (1+) (A) Negative   *Note: Due to a large number of results and/or encounters for the requested time period, some results have not been displayed. A complete set of results can be found in Results Review.    Post Void  Residual - 05/11/24 1439       Post Void Residual   Post Void Residual 33 mL  ASSESSMENT AND PLAN    [name] is a 83 y.o. with:  1. Vaginal itching   2. Urinary frequency   3. Leukocytes in urine   4. Hematuria, unspecified type     - Good results with urethral bulking and botox . Will plan to continue botox  injections q 6 months. We discussed the urethral bulking typically lasts several years.  - Aptima obtained for itching. We discussed use of vaginal estrogen twice a week and coconut oil on the vulva for moisture.  - Urine sent for culture.   Return early march for botox   Debra LOISE Caper, MD

## 2024-05-11 NOTE — Patient Instructions (Addendum)
 Restart estradiol  cream two times per week to help prevent urinary tract infections.   Can also use coconut oil on the vulva as needed to help with dryness.

## 2024-05-12 ENCOUNTER — Ambulatory Visit: Payer: Self-pay | Admitting: Obstetrics and Gynecology

## 2024-05-12 LAB — URINE CULTURE: Culture: NO GROWTH

## 2024-05-12 LAB — CERVICOVAGINAL ANCILLARY ONLY
Bacterial Vaginitis (gardnerella): NEGATIVE
Candida Glabrata: NEGATIVE
Candida Vaginitis: NEGATIVE
Comment: NEGATIVE
Comment: NEGATIVE
Comment: NEGATIVE

## 2024-05-13 ENCOUNTER — Telehealth: Payer: Self-pay

## 2024-05-13 NOTE — Telephone Encounter (Signed)
 Patient LVM on nurse line requesting to speak with Dr. Koval.   Returned call to patient. She reports concerns with her blood sugar and insulin  regimen.   She states that she cannot remember taking her insulin  yesterday. Felt that she was very tired all day yesterday. Reports taking insulin  today and feeling more like herself. Denies any other symptoms.  Recent CBGs below: Tues: 150 Wed: 268 Thursday: 190 Friday: 175  *All measurements were fasting.   Requesting returned call from Dr. Koval to discuss further.   Debra JAYSON English, RN

## 2024-05-16 ENCOUNTER — Encounter: Admitting: Registered Nurse

## 2024-05-16 NOTE — Telephone Encounter (Signed)
 Patient contacted for follow-up of reports of exhaustion and concern for insulin  dosing.   Since last contact patient reports glucose reading have returned to near normal for this patient with most fasting readings in the 100-199 range.  Reports fasting readings for yesterday and today of 156 and 147.    Current Medications include: Lantus  (insulin  glargine) 8 units each morning.  Has NOT yet taken dose today. Denies ANY readings< 120mg /dl Patient denies any significant medication related side effects BUT reports exhaustion. .  Medication Plan: - Continue Lantus  (insulin  glargine) at same dose of 8 units each AM.  Advised to take insulin  as long as she had glucose values > 90 and was eating normal frequency and quantity.   Total time with patient call and documentation of interaction: 11 minutes. Patient was appreciative of the call back and guidance.   F/U Phone call planned: None Patient has appointment 10/9 with Dr. Suzen.

## 2024-05-16 NOTE — Telephone Encounter (Signed)
 Reviewed and agree with Dr Rennis plan.

## 2024-05-17 ENCOUNTER — Other Ambulatory Visit (HOSPITAL_COMMUNITY): Payer: Self-pay

## 2024-05-18 ENCOUNTER — Other Ambulatory Visit: Payer: Self-pay

## 2024-05-18 ENCOUNTER — Other Ambulatory Visit (HOSPITAL_COMMUNITY): Payer: Self-pay

## 2024-05-19 ENCOUNTER — Ambulatory Visit

## 2024-05-19 ENCOUNTER — Other Ambulatory Visit (HOSPITAL_COMMUNITY): Payer: Self-pay

## 2024-05-20 ENCOUNTER — Ambulatory Visit: Admitting: Podiatry

## 2024-05-20 ENCOUNTER — Ambulatory Visit: Admitting: Family Medicine

## 2024-05-20 ENCOUNTER — Telehealth: Payer: Self-pay

## 2024-05-20 NOTE — Telephone Encounter (Signed)
 Patient calls nurse line reporting diarrhea.   She reports she has had liquid diarrhea since yesterday. She reports various times throughout the day especially after she has eaten.   She denies any blood in stool. No fevers, chills or body aches. She denies any nausea or vomiting.   She asks I cancel her apt for this afternoon. She reports she will not be able to leave the house today with symptoms.   Advised will forward to PCP for any recommendations. She reports she is unsure if she is a candidate for imodium.   Advised to stay well hydrated.   ED precautions discussed.

## 2024-05-23 ENCOUNTER — Encounter: Payer: Self-pay | Admitting: Registered Nurse

## 2024-05-23 ENCOUNTER — Encounter: Attending: Physical Medicine and Rehabilitation | Admitting: Registered Nurse

## 2024-05-23 VITALS — BP 114/76 | HR 105 | Ht 59.0 in | Wt 175.0 lb

## 2024-05-23 DIAGNOSIS — Z5181 Encounter for therapeutic drug level monitoring: Secondary | ICD-10-CM | POA: Diagnosis not present

## 2024-05-23 DIAGNOSIS — R0902 Hypoxemia: Secondary | ICD-10-CM | POA: Insufficient documentation

## 2024-05-23 DIAGNOSIS — R000 Tachycardia, unspecified: Secondary | ICD-10-CM | POA: Insufficient documentation

## 2024-05-23 DIAGNOSIS — M25562 Pain in left knee: Secondary | ICD-10-CM | POA: Insufficient documentation

## 2024-05-23 DIAGNOSIS — M546 Pain in thoracic spine: Secondary | ICD-10-CM | POA: Diagnosis not present

## 2024-05-23 DIAGNOSIS — M25511 Pain in right shoulder: Secondary | ICD-10-CM | POA: Insufficient documentation

## 2024-05-23 DIAGNOSIS — G894 Chronic pain syndrome: Secondary | ICD-10-CM | POA: Diagnosis not present

## 2024-05-23 DIAGNOSIS — Z79891 Long term (current) use of opiate analgesic: Secondary | ICD-10-CM | POA: Insufficient documentation

## 2024-05-23 DIAGNOSIS — M545 Low back pain, unspecified: Secondary | ICD-10-CM | POA: Diagnosis not present

## 2024-05-23 DIAGNOSIS — G8929 Other chronic pain: Secondary | ICD-10-CM | POA: Diagnosis not present

## 2024-05-23 NOTE — Patient Instructions (Addendum)
 Call your Primary Care Physician Today.   You arrived to office with elevated heart rate and your oxygen level is low You have refused Emergency Room  or Urgent Care evaluation. If you develop any SOB or Chest pain call 911 immediately.    I want you to call office on Thursday with update on your medication: The goal will be to decrease your oxycodone  to 4 times a day as needed for pain.   Keep a Pain journal for the next 4 days.  [phone]

## 2024-05-23 NOTE — Progress Notes (Signed)
 Subjective:    Patient ID: [name], female    DOB: Jan 07, 1941, 83 y.o.   MRN: [id number]  HPI: [name] is a 83 y.o. female who returns for follow up appointment for chronic pain and medication refill. She states her  pain is located in her right shoulder, .upper- lower back and left knee pain. She arrived to office with oxygen desaturation and tachycardia, she denies SOB or chest pain. Reports earlier today she had a cough and took a tessalon  perles . Apical pulse was checked and Oxygen saturation, she refuses ED or Urgent Care evaluation. She was instructed to call 911, if she develops any distress, she verbalizes understanding. [name] reports she will call her PCP for an appointment, she was instructed to call this provider this week with update, she verbalizes understanding.    She rates her pain 10.Her  current exercise regime is walking with her walker short distances.   [name] Morphine  equivalent is 90.00 MME.   Oral Swab was Performed today. No prescription was given today, she was instructed to decrease her Oxycodone  dose to 5 times a day as needed for pain, with a goal to decrease to 4 times a day as needed for pain. She verbalizes understanding.    Pain Inventory Average Pain 10 Pain Right Now 10 My pain is sharp and burning  In the last 24 hours, has pain interfered with the following? General activity 10 Relation with others 10 Enjoyment of life 9 What TIME of day is your pain at its worst? morning , daytime, evening, and night Sleep (in general) Poor  Pain is worse with: walking, bending, sitting, inactivity, standing, unsure, and some activites Pain improves with: medication Relief from Meds: 7  Family History  Problem Relation Age of Onset   Hypertension Mother    Hypertension Father    Hearing loss Sister    Heart attack Sister 62       s/p 4 bypasses    Stroke Brother        86   Breast cancer Maternal Aunt    Ovarian cancer Maternal Aunt     Social History   Socioeconomic History   Marital status: Widowed    Spouse name: Not on file   Number of children: 2   Years of education: Not on file   Highest education level: Not on file  Occupational History   Occupation: Retired  Tobacco Use   Smoking status: Former    Current packs/day: 0.00    Average packs/day: 2.0 packs/day for 33.6 years (67.3 ttl pk-yrs)    Types: Cigarettes    Start date: 08/11/1957    Quit date: 04/04/1991    Years since quitting: 33.1   Smokeless tobacco: Never  Vaping Use   Vaping status: Never Used  Substance and Sexual Activity   Alcohol use: No   Drug use: No   Sexual activity: Not Currently  Other Topics Concern   Not on file  Social History Narrative   Not on file   Social Drivers of Health   Financial Resource Strain: Low Risk  (10/29/2023)   Overall Financial Resource Strain (CARDIA)    Difficulty of Paying Living Expenses: Not hard at all  Food Insecurity: No Food Insecurity (10/29/2023)   Hunger Vital Sign    Worried About Running Out of Food in the Last Year: Never true    Ran Out of Food in the Last Year: Never true  Transportation Needs: No Transportation Needs (  10/29/2023)   PRAPARE - Administrator, Civil Service (Medical): No    Lack of Transportation (Non-Medical): No  Physical Activity: Inactive (10/29/2023)   Exercise Vital Sign    Days of Exercise per Week: 0 days    Minutes of Exercise per Session: 0 min  Stress: No Stress Concern Present (10/29/2023)   [employer] of Occupational Health - Occupational Stress Questionnaire    Feeling of Stress : Not at all  Social Connections: Moderately Integrated (10/29/2023)   Social Connection and Isolation Panel    Frequency of Communication with Friends and Family: More than three times a week    Frequency of Social Gatherings with Friends and Family: More than three times a week    Attends Religious Services: More than 4 times per year    Active Member of  [employer] or Organizations: Yes    Attends Banker Meetings: More than 4 times per year    Marital Status: Widowed   Past Surgical History:  Procedure Laterality Date   ABDOMINAL HYSTERECTOMY     ?Lt. ovary remains   APPENDECTOMY     Arm surgery     BREAST BIOPSY Right 10/10/2009   BREAST BIOPSY Right 09/17/2022   MM RT BREAST BX W LOC DEV 1ST LESION IMAGE BX SPEC STEREO GUIDE 09/17/2022 [facility]   BREAST CYST ASPIRATION  12/20/2013   BREAST EXCISIONAL BIOPSY Left 1998   BREAST EXCISIONAL BIOPSY Right 1993   ESOPHAGOGASTRODUODENOSCOPY N/A 04/17/2021   Procedure: ESOPHAGOGASTRODUODENOSCOPY (EGD);  Surgeon: Kristie Lamprey, MD;  Location: [facility] ENDOSCOPY;  Service: Endoscopy;  Laterality: N/A;  Rectal bleeding and anemia.   ESOPHAGOGASTRODUODENOSCOPY N/A 04/18/2021   Procedure: ESOPHAGOGASTRODUODENOSCOPY (EGD);  Surgeon: Rollin Dover, MD;  Location: [facility] ENDOSCOPY;  Service: Endoscopy;  Laterality: N/A;   HOT HEMOSTASIS N/A 04/18/2021   Procedure: HOT HEMOSTASIS (ARGON PLASMA COAGULATION/BICAP);  Surgeon: Rollin Dover, MD;  Location: [facility] ENDOSCOPY;  Service: Endoscopy;  Laterality: N/A;   IR ANGIOGRAM SELECTIVE EACH ADDITIONAL VESSEL  04/17/2021   IR ANGIOGRAM VISCERAL SELECTIVE  04/17/2021   IR EMBO ART  VEN HEMORR LYMPH EXTRAV  INC GUIDE ROADMAPPING  04/17/2021   IR US  GUIDE VASC ACCESS RIGHT  04/17/2021   RADIOLOGY WITH ANESTHESIA N/A 04/17/2021   Procedure: RADIOLOGY WITH ANESTHESIA;  Surgeon: Radiologist, Medication, MD;  Location: [facility] OR;  Service: Radiology;  Laterality: N/A;   SUBMUCOSAL INJECTION  04/18/2021   Procedure: SUBMUCOSAL INJECTION;  Surgeon: Rollin Dover, MD;  Location: [facility] ENDOSCOPY;  Service: Endoscopy;;   Past Surgical History:  Procedure Laterality Date   ABDOMINAL HYSTERECTOMY     ?Lt. ovary remains   APPENDECTOMY     Arm surgery     BREAST BIOPSY Right 10/10/2009   BREAST BIOPSY Right 09/17/2022   MM RT BREAST BX W LOC DEV 1ST LESION IMAGE BX SPEC STEREO GUIDE  09/17/2022 [facility]   BREAST CYST ASPIRATION  12/20/2013   BREAST EXCISIONAL BIOPSY Left 1998   BREAST EXCISIONAL BIOPSY Right 1993   ESOPHAGOGASTRODUODENOSCOPY N/A 04/17/2021   Procedure: ESOPHAGOGASTRODUODENOSCOPY (EGD);  Surgeon: Kristie Lamprey, MD;  Location: [facility] ENDOSCOPY;  Service: Endoscopy;  Laterality: N/A;  Rectal bleeding and anemia.   ESOPHAGOGASTRODUODENOSCOPY N/A 04/18/2021   Procedure: ESOPHAGOGASTRODUODENOSCOPY (EGD);  Surgeon: Rollin Dover, MD;  Location: [facility] ENDOSCOPY;  Service: Endoscopy;  Laterality: N/A;   HOT HEMOSTASIS N/A 04/18/2021   Procedure: HOT HEMOSTASIS (ARGON PLASMA COAGULATION/BICAP);  Surgeon: Rollin Dover, MD;  Location: [facility] ENDOSCOPY;  Service: Endoscopy;  Laterality: N/A;  IR ANGIOGRAM SELECTIVE EACH ADDITIONAL VESSEL  04/17/2021   IR ANGIOGRAM VISCERAL SELECTIVE  04/17/2021   IR EMBO ART  VEN HEMORR LYMPH EXTRAV  INC GUIDE ROADMAPPING  04/17/2021   IR US  GUIDE VASC ACCESS RIGHT  04/17/2021   RADIOLOGY WITH ANESTHESIA N/A 04/17/2021   Procedure: RADIOLOGY WITH ANESTHESIA;  Surgeon: Radiologist, Medication, MD;  Location: [facility] OR;  Service: Radiology;  Laterality: N/A;   SUBMUCOSAL INJECTION  04/18/2021   Procedure: SUBMUCOSAL INJECTION;  Surgeon: Rollin Dover, MD;  Location: [facility] ENDOSCOPY;  Service: Endoscopy;;   Past Medical History:  Diagnosis Date   Asthma    Depression    Diabetes mellitus without complication (HCC)    History of surgery on arm    right arm ( plate and screws)   Hypertension    Macular degeneration    Migraine    without aura   Pneumonia    Urinary incontinence    BP 114/76   Pulse (!) 104   Ht 4' 11 (1.499 m)   Wt 175 lb (79.4 kg)   SpO2 (!) 88%   BMI 35.35 kg/m   Opioid Risk Score:   Fall Risk Score:  `1  Depression screen PHQ 2/9     04/04/2024    1:43 PM 02/23/2024    1:19 PM 01/18/2024    9:07 AM 01/15/2024   10:47 AM 12/11/2023   11:22 AM 11/24/2023    2:10 PM 11/23/2023   10:50 AM  Depression screen PHQ 2/9  Decreased  Interest 0 0 0 0 0 0 0  Down, Depressed, Hopeless 0 0 0 0 0 0 0  PHQ - 2 Score 0 0 0 0 0 0 0  Altered sleeping    0 0  0  Tired, decreased energy    0 0  0  Change in appetite    0 0  0  Feeling bad or failure about yourself     0 0  0  Trouble concentrating    0 0  0  Moving slowly or fidgety/restless    0 0  0  Suicidal thoughts    0 0  0  PHQ-9 Score    0 0  0  Difficult doing work/chores       Not difficult at all      Review of Systems  Musculoskeletal:  Positive for back pain.       Left hip and knee pain  All other systems reviewed and are negative.      Objective:   Physical Exam Vitals and nursing note reviewed.  Constitutional:      Appearance: Normal appearance.  Cardiovascular:     Rate and Rhythm: Tachycardia present.     Pulses: Normal pulses.     Heart sounds: Normal heart sounds.  Pulmonary:     Effort: Pulmonary effort is normal.     Breath sounds: Normal breath sounds.  Musculoskeletal:     Comments: Normal Muscle Bulk and Muscle Testing Reveals:  Upper Extremities: Right: Decreased ROM 90 Degrees and Muscle Strength 5/5 Left Upper Extremity: Full ROM and Muscle Strength 5/5 Lumbar Paraspinal Tenderness:L-1-L-3  Lower Extremities: Full ROM and Muscle Strength 5/5 Arises from Table slowly using walker for support Narrow Based  Gait     Skin:    General: Skin is warm and dry.  Neurological:     Mental Status: She is alert and oriented to person, place, and time.  Psychiatric:  Mood and Affect: Mood normal.        Behavior: Behavior normal.          Assessment & Plan:  Chronic Bilateral Thoracic Back Pain: Continue HEP as Tolerated. Continue to Monitor. 05/23/2024 Chronic Bilateral Thoracic Back pain without Sciatica: Continue HEP as Tolerated. Continue to Monitor.  Chronic Left Knee Pain: Continue HEP as Tolerated. Continue to Monitor. 05/23/2024 Tachycardia: Apical pulse checked. Ms. Mingle refuses ED or Urgent Care evaluation   Oxygen Desaturation: O2 Saturation checked, she refuses ED or Urgent Care evaluation. She was instructed to call 911, if she develops any distress, she verbalizes understanding.  Chronic Pain Syndrome: Continue : Oxycodone  10/325 mg , she was instructed to decrease to 5 times a day as needed for pain with a goal to decrease to 4 times a day as needed for pain, she verbalizes understanding. . We will continue the opioid monitoring program, this consists of regular clinic visits, examinations, urine drug screen, pill counts as well as use of [place]  Controlled Substance Reporting system. A 12 month History has been reviewed on the [place]  Controlled Substance Reporting System on 05/23/2024   F/U in 1 month

## 2024-05-24 NOTE — Telephone Encounter (Signed)
 Called patient.   She reports she is feeling much better. However, she reports she was at her pain management doctor and was told she had a high HR and low O2 sats. (See their note.)  Patient reports fatigue but overall feels fine. No shortness of breath, chest pains, dizziness or blurred vision.   She was speaking in full sentences.  Patient advised she should be evaluated for concerning symptoms.   She requests to see PCP on 10/20.  ED precautions discussed with patient in the meantime.

## 2024-05-27 LAB — DRUG TOX MONITOR 1 W/CONF, ORAL FLD
Amphetamines: NEGATIVE ng/mL (ref <10)
Barbiturates: NEGATIVE ng/mL (ref <10)
Benzodiazepines: NEGATIVE ng/mL (ref <0.50)
Buprenorphine: NEGATIVE ng/mL (ref <0.10)
Cocaine: NEGATIVE ng/mL (ref <5.0)
Codeine: NEGATIVE ng/mL (ref <2.5)
Dihydrocodeine: NEGATIVE ng/mL (ref <2.5)
Fentanyl: NEGATIVE ng/mL (ref <0.10)
Heroin Metabolite: NEGATIVE ng/mL (ref <1.0)
Hydrocodone: NEGATIVE ng/mL (ref <2.5)
Hydromorphone: NEGATIVE ng/mL (ref <2.5)
MARIJUANA: NEGATIVE ng/mL (ref <2.5)
MDMA: NEGATIVE ng/mL (ref <10)
Meprobamate: NEGATIVE ng/mL (ref <2.5)
Methadone: NEGATIVE ng/mL (ref <5.0)
Morphine: NEGATIVE ng/mL (ref <2.5)
Nicotine Metabolite: NEGATIVE ng/mL (ref <5.0)
Norhydrocodone: NEGATIVE ng/mL (ref <2.5)
Noroxycodone: 16.4 ng/mL — ABNORMAL HIGH (ref <2.5)
Opiates: POSITIVE ng/mL — AB (ref <2.5)
Oxycodone: 74.2 ng/mL — ABNORMAL HIGH (ref <2.5)
Oxymorphone: NEGATIVE ng/mL (ref <2.5)
Phencyclidine: NEGATIVE ng/mL (ref <10)
Tapentadol: NEGATIVE ng/mL (ref <5.0)
Tramadol: NEGATIVE ng/mL (ref <5.0)
Zolpidem: NEGATIVE ng/mL (ref <5.0)

## 2024-05-27 LAB — DRUG TOX ALC METAB W/CON, ORAL FLD: Alcohol Metabolite: NEGATIVE ng/mL (ref <25)

## 2024-05-30 ENCOUNTER — Emergency Department (HOSPITAL_COMMUNITY)

## 2024-05-30 ENCOUNTER — Ambulatory Visit: Admitting: Family Medicine

## 2024-05-30 ENCOUNTER — Inpatient Hospital Stay (HOSPITAL_COMMUNITY)
Admission: EM | Admit: 2024-05-30 | Discharge: 2024-06-02 | DRG: 193 | Disposition: A | Discharge status: Home / Self Care | Source: Ambulatory Visit

## 2024-05-30 ENCOUNTER — Encounter: Payer: Self-pay | Admitting: Family Medicine

## 2024-05-30 ENCOUNTER — Other Ambulatory Visit: Payer: Self-pay

## 2024-05-30 VITALS — BP 121/77 | HR 110 | Wt 177.6 lb

## 2024-05-30 DIAGNOSIS — J45901 Unspecified asthma with (acute) exacerbation: Secondary | ICD-10-CM | POA: Diagnosis not present

## 2024-05-30 DIAGNOSIS — G894 Chronic pain syndrome: Secondary | ICD-10-CM | POA: Diagnosis present

## 2024-05-30 DIAGNOSIS — E118 Type 2 diabetes mellitus with unspecified complications: Secondary | ICD-10-CM

## 2024-05-30 DIAGNOSIS — Z888 Allergy status to other drugs, medicaments and biological substances status: Secondary | ICD-10-CM

## 2024-05-30 DIAGNOSIS — R0602 Shortness of breath: Secondary | ICD-10-CM | POA: Diagnosis not present

## 2024-05-30 DIAGNOSIS — Z789 Other specified health status: Secondary | ICD-10-CM

## 2024-05-30 DIAGNOSIS — J439 Emphysema, unspecified: Secondary | ICD-10-CM | POA: Diagnosis present

## 2024-05-30 DIAGNOSIS — Z881 Allergy status to other antibiotic agents status: Secondary | ICD-10-CM | POA: Diagnosis not present

## 2024-05-30 DIAGNOSIS — R002 Palpitations: Secondary | ICD-10-CM | POA: Diagnosis not present

## 2024-05-30 DIAGNOSIS — Z8249 Family history of ischemic heart disease and other diseases of the circulatory system: Secondary | ICD-10-CM | POA: Diagnosis not present

## 2024-05-30 DIAGNOSIS — R000 Tachycardia, unspecified: Secondary | ICD-10-CM | POA: Diagnosis not present

## 2024-05-30 DIAGNOSIS — J189 Pneumonia, unspecified organism: Secondary | ICD-10-CM | POA: Diagnosis not present

## 2024-05-30 DIAGNOSIS — H3530 Unspecified macular degeneration: Secondary | ICD-10-CM | POA: Diagnosis not present

## 2024-05-30 DIAGNOSIS — Z882 Allergy status to sulfonamides status: Secondary | ICD-10-CM | POA: Diagnosis not present

## 2024-05-30 DIAGNOSIS — R918 Other nonspecific abnormal finding of lung field: Secondary | ICD-10-CM | POA: Diagnosis not present

## 2024-05-30 DIAGNOSIS — Z87891 Personal history of nicotine dependence: Secondary | ICD-10-CM | POA: Diagnosis not present

## 2024-05-30 DIAGNOSIS — Z794 Long term (current) use of insulin: Secondary | ICD-10-CM

## 2024-05-30 DIAGNOSIS — Z79818 Long term (current) use of other agents affecting estrogen receptors and estrogen levels: Secondary | ICD-10-CM | POA: Diagnosis not present

## 2024-05-30 DIAGNOSIS — I358 Other nonrheumatic aortic valve disorders: Secondary | ICD-10-CM | POA: Diagnosis not present

## 2024-05-30 DIAGNOSIS — M940 Chondrocostal junction syndrome [Tietze]: Secondary | ICD-10-CM | POA: Diagnosis present

## 2024-05-30 DIAGNOSIS — J9601 Acute respiratory failure with hypoxia: Principal | ICD-10-CM | POA: Diagnosis present

## 2024-05-30 DIAGNOSIS — J984 Other disorders of lung: Secondary | ICD-10-CM | POA: Diagnosis not present

## 2024-05-30 DIAGNOSIS — R079 Chest pain, unspecified: Secondary | ICD-10-CM | POA: Diagnosis present

## 2024-05-30 DIAGNOSIS — K219 Gastro-esophageal reflux disease without esophagitis: Secondary | ICD-10-CM | POA: Diagnosis present

## 2024-05-30 DIAGNOSIS — J168 Pneumonia due to other specified infectious organisms: Secondary | ICD-10-CM | POA: Diagnosis not present

## 2024-05-30 DIAGNOSIS — Z79899 Other long term (current) drug therapy: Secondary | ICD-10-CM

## 2024-05-30 DIAGNOSIS — Z1152 Encounter for screening for COVID-19: Secondary | ICD-10-CM

## 2024-05-30 DIAGNOSIS — E119 Type 2 diabetes mellitus without complications: Secondary | ICD-10-CM | POA: Diagnosis not present

## 2024-05-30 DIAGNOSIS — R011 Cardiac murmur, unspecified: Secondary | ICD-10-CM | POA: Diagnosis not present

## 2024-05-30 DIAGNOSIS — Z803 Family history of malignant neoplasm of breast: Secondary | ICD-10-CM

## 2024-05-30 DIAGNOSIS — F419 Anxiety disorder, unspecified: Secondary | ICD-10-CM | POA: Diagnosis not present

## 2024-05-30 DIAGNOSIS — Z66 Do not resuscitate: Secondary | ICD-10-CM | POA: Diagnosis present

## 2024-05-30 DIAGNOSIS — Z885 Allergy status to narcotic agent status: Secondary | ICD-10-CM | POA: Diagnosis not present

## 2024-05-30 DIAGNOSIS — F32A Depression, unspecified: Secondary | ICD-10-CM | POA: Diagnosis present

## 2024-05-30 DIAGNOSIS — Z8041 Family history of malignant neoplasm of ovary: Secondary | ICD-10-CM

## 2024-05-30 DIAGNOSIS — E785 Hyperlipidemia, unspecified: Secondary | ICD-10-CM | POA: Diagnosis not present

## 2024-05-30 DIAGNOSIS — J302 Other seasonal allergic rhinitis: Secondary | ICD-10-CM | POA: Diagnosis not present

## 2024-05-30 DIAGNOSIS — I10 Essential (primary) hypertension: Secondary | ICD-10-CM | POA: Diagnosis not present

## 2024-05-30 DIAGNOSIS — Z823 Family history of stroke: Secondary | ICD-10-CM

## 2024-05-30 LAB — BRAIN NATRIURETIC PEPTIDE: B Natriuretic Peptide: 26.8 pg/mL (ref 0.0–100.0)

## 2024-05-30 LAB — CBC WITH DIFFERENTIAL/PLATELET
Abs Immature Granulocytes: 0.03 K/uL (ref 0.00–0.07)
Basophils Absolute: 0 K/uL (ref 0.0–0.1)
Basophils Relative: 1 %
Eosinophils Absolute: 0.3 K/uL (ref 0.0–0.5)
Eosinophils Relative: 5 %
HCT: 39 % (ref 36.0–46.0)
Hemoglobin: 12.7 g/dL (ref 12.0–15.0)
Immature Granulocytes: 1 %
Lymphocytes Relative: 21 %
Lymphs Abs: 1.3 K/uL (ref 0.7–4.0)
MCH: 29.5 pg (ref 26.0–34.0)
MCHC: 32.6 g/dL (ref 30.0–36.0)
MCV: 90.7 fL (ref 80.0–100.0)
Monocytes Absolute: 0.7 K/uL (ref 0.1–1.0)
Monocytes Relative: 12 %
Neutro Abs: 4 K/uL (ref 1.7–7.7)
Neutrophils Relative %: 60 %
Platelets: 191 K/uL (ref 150–400)
RBC: 4.3 MIL/uL (ref 3.87–5.11)
RDW: 14.9 % (ref 11.5–15.5)
WBC: 6.4 K/uL (ref 4.0–10.5)
nRBC: 0 % (ref 0.0–0.2)

## 2024-05-30 LAB — RESP PANEL BY RT-PCR (RSV, FLU A&B, COVID)  RVPGX2
Influenza A by PCR: NEGATIVE
Influenza B by PCR: NEGATIVE
Resp Syncytial Virus by PCR: NEGATIVE
SARS Coronavirus 2 by RT PCR: NEGATIVE

## 2024-05-30 LAB — BASIC METABOLIC PANEL WITH GFR
Anion gap: 11 (ref 5–15)
BUN: 14 mg/dL (ref 8–23)
CO2: 22 mmol/L (ref 22–32)
Calcium: 9.2 mg/dL (ref 8.9–10.3)
Chloride: 104 mmol/L (ref 98–111)
Creatinine, Ser: 0.81 mg/dL (ref 0.44–1.00)
GFR, Estimated: >60 mL/min (ref >60)
Glucose, Bld: 110 mg/dL — ABNORMAL HIGH (ref 70–99)
Potassium: 4.8 mmol/L (ref 3.5–5.1)
Sodium: 137 mmol/L (ref 135–145)

## 2024-05-30 LAB — TROPONIN I (HIGH SENSITIVITY)
Troponin I (High Sensitivity): 18 ng/L — ABNORMAL HIGH (ref <18)
Troponin I (High Sensitivity): 19 ng/L — ABNORMAL HIGH (ref <18)

## 2024-05-30 LAB — CBG MONITORING, ED: Glucose-Capillary: 88 mg/dL (ref 70–99)

## 2024-05-30 MED ORDER — BISACODYL 5 MG PO TBEC: 5.0000 mg | DELAYED_RELEASE_TABLET | Freq: Every day | ORAL | Status: DC | PRN

## 2024-05-30 MED ORDER — IOHEXOL 350 MG/ML SOLN
75.0000 mL | Freq: Once | INTRAVENOUS | Status: AC | PRN
  Administered 2024-05-30: 70 mL via INTRAVENOUS

## 2024-05-30 MED ORDER — DOXYCYCLINE HYCLATE 100 MG PO TABS
100.0000 mg | ORAL_TABLET | Freq: Once | ORAL | Status: AC
  Administered 2024-05-30: 100 mg via ORAL
  Filled 2024-05-30: qty 1

## 2024-05-30 MED ORDER — ONDANSETRON HCL 4 MG PO TABS: 4.0000 mg | ORAL_TABLET | Freq: Four times a day (QID) | ORAL | Status: DC | PRN

## 2024-05-30 MED ORDER — SODIUM CHLORIDE 0.9 % IV SOLN
1.0000 g | Freq: Once | INTRAVENOUS | Status: AC
  Administered 2024-05-30: 1 g via INTRAVENOUS
  Filled 2024-05-30: qty 10

## 2024-05-30 MED ORDER — ENOXAPARIN SODIUM 40 MG/0.4ML IJ SOSY
40.0000 mg | PREFILLED_SYRINGE | INTRAMUSCULAR | Status: DC
  Administered 2024-05-30 – 2024-06-01 (×3): 40 mg via SUBCUTANEOUS
  Filled 2024-05-30 (×3): qty 0.4

## 2024-05-30 MED ORDER — OXYCODONE-ACETAMINOPHEN 10-325 MG PO TABS: 1.0000 | ORAL_TABLET | ORAL | Status: DC | PRN

## 2024-05-30 MED ORDER — IPRATROPIUM-ALBUTEROL 0.5-2.5 (3) MG/3ML IN SOLN: 3.0000 mL | RESPIRATORY_TRACT | Status: DC | PRN

## 2024-05-30 MED ORDER — MORPHINE SULFATE (PF) 2 MG/ML IV SOLN
2.0000 mg | Freq: Once | INTRAVENOUS | Status: DC
  Filled 2024-05-30: qty 1

## 2024-05-30 MED ORDER — ACETAMINOPHEN 650 MG RE SUPP: 650.0000 mg | Freq: Four times a day (QID) | RECTAL | Status: DC | PRN

## 2024-05-30 MED ORDER — SENNOSIDES-DOCUSATE SODIUM 8.6-50 MG PO TABS: 1.0000 | ORAL_TABLET | Freq: Every evening | ORAL | Status: DC | PRN

## 2024-05-30 MED ORDER — ONDANSETRON HCL 4 MG/2ML IJ SOLN: 4.0000 mg | Freq: Four times a day (QID) | INTRAMUSCULAR | Status: DC | PRN

## 2024-05-30 MED ORDER — OXYCODONE HCL 5 MG PO TABS
10.0000 mg | ORAL_TABLET | ORAL | Status: DC | PRN
  Administered 2024-05-31 – 2024-06-01 (×5): 10 mg via ORAL
  Filled 2024-05-30 (×5): qty 2

## 2024-05-30 MED ORDER — ACETAMINOPHEN 325 MG PO TABS
650.0000 mg | ORAL_TABLET | Freq: Four times a day (QID) | ORAL | Status: DC | PRN
  Administered 2024-05-31 – 2024-06-02 (×3): 650 mg via ORAL
  Filled 2024-05-30 (×3): qty 2

## 2024-05-30 NOTE — ED Triage Notes (Signed)
 Patient arrives via [facility] for chest pain and shortness of breath from family care office. PCP called for HR 140-150 and shob x 1 week, 94 on room air. CP consistent over a week without radiation. No ASA. Alert oriented x4  BP 140/92 HR 140-150 ST on monitor 94 on room air

## 2024-05-30 NOTE — ED Provider Triage Note (Signed)
 Emergency Medicine Provider Triage Evaluation Note  [name] , a 83 y.o. female  was evaluated in triage.  Pt complains of right-sided chest pain that has been ongoing for the past week.  Denies any pain with exertion, no radiation of pain to the back.  Denies any pleuritic chest pain.  She reports an ongoing cough for the past week as well.  This seems to be getting better over the past week.  Denies any fever or chills.  Denies any lower extremity pain or swelling.  No recent long plane or car rides, recent hospitalizations or surgeries, or any history of blood clot.  Review of Systems  Positive: As above Negative: As above  Physical Exam  BP (!) 122/108 (BP Location: Left Arm)   Pulse (!) 116   Temp 98.9 F (37.2 C) (Oral)   Resp 20   Ht 4' 11 (1.499 m)   Wt 80.6 kg   SpO2 92%   BMI 35.87 kg/m  Gen:   Awake, no distress   Resp:  Normal effort  MSK:   Moves extremities without difficulty    Medical Decision Making  Medically screening exam initiated at 1:30 PM.  Appropriate orders placed.  [name] was informed that the remainder of the evaluation will be completed by another provider, this initial triage assessment does not replace that evaluation, and the importance of remaining in the ED until their evaluation is complete.     Veta Palma, PA-C 05/30/24 1330

## 2024-05-30 NOTE — ED Provider Notes (Signed)
 [place] EMERGENCY DEPARTMENT AT [facility] Provider Note   CSN: [id number] Arrival date & time: 05/30/24  1316     Patient presents with: Chest Pain and Shortness of Breath   [name] is a 83 y.o. female with past ministry of HLD, insulin -dependent T2DM, HTN, asthma, IBS, sleep apnea, IDA, AAA, pulmonary nodule, urinary incontinence presents to emergency department for evaluation of chest pain, shortness of breath, nonproductive cough, nasal congestion over the past week.  Reports pain is primarily located on right side of chest that has been constant over the past week. Worsens with exertion and palpation.  Pain is described as a squeezing and located on right anterior chest and under her right breast.  She also feels shortness of breath at rest that also worsens with exertion, excessive talking.  Has been using Tessalon  Perles for coughing which helps.  No O2 at home. No current tobacco abuse.. Denies thinners, recent travel, recent surgery, exogenous hormones, unilateral pitting edema, hemoptysis, history of PE/DVT  Was evaluated by PCP today who recommended ED evaluation for tachycardia (110bpm), hypoxia (85% RA), chest pain, shortness of breath.  Note from PT on 05/23/2024 also noted oxygen desaturation, tachycardia with no chest pain or shortness of breath.  They also recommended ED evaluation however she refused at that time     Chest Pain Associated symptoms: shortness of breath   Shortness of Breath Associated symptoms: chest pain        Prior to Admission medications   Medication Sig Start Date End Date Taking? Authorizing Provider  Artificial Tear Ointment (DRY EYES OP) Apply 1 drop to eye 2 (two) times daily as needed (dry eyes).    [provider]  benzonatate  (TESSALON ) 100 MG capsule Take 1 capsule (100 mg total) by mouth 2 (two) times daily as needed for cough. 12/11/23   Rumball, Alison M, DO  busPIRone  (BUSPAR ) 10 MG tablet Take 1 tablet  (10 mg total) by mouth 3 (three) times daily. 12/31/23   Rumball, Alison M, DO  clobetasol  ointment (TEMOVATE ) 0.05 % Apply topically to vulva twice a week. 01/19/24   Marilynne Rosaline SAILOR, MD  DULoxetine  (CYMBALTA ) 60 MG capsule Take 1 capsule (60 mg total) by mouth daily. 08/28/23   Raulkar, Sven SQUIBB, MD  DULoxetine  (CYMBALTA ) 60 MG capsule Take 1 capsule (60 mg total) by mouth daily. 12/10/23   Raulkar, Sven SQUIBB, MD  enalapril  (VASOTEC ) 10 MG tablet Take 1 tablet (10 mg total) by mouth daily. 07/01/23   Rumball, Alison M, DO  estradiol  (ESTRACE ) 0.1 MG/GM vaginal cream Place 0.5g nightly for two weeks then twice a week after 03/13/22   Marilynne Rosaline SAILOR, MD  estradiol  (ESTRACE ) 0.1 MG/GM vaginal cream Place 0.5 g vaginally 2 (two) times a week. 01/21/24   Marilynne Rosaline SAILOR, MD  famotidine  (PEPCID ) 20 MG tablet Take 1 tablet (20 mg total) by mouth 2 (two) times daily. 03/08/24     famotidine  (PEPCID ) 40 MG tablet Take 1 tablet (40 mg total) by mouth daily. 02/05/24   Rumball, Alison M, DO  glucose blood (ONETOUCH VERIO) test strip USE 1 STRIP TO TEST 2 TIMES DAILY AS DIRECTED 04/07/24   Rumball, Alison M, DO  insulin  glargine (LANTUS ) 100 UNIT/ML Solostar Pen Inject 8 Units into the skin every morning. 05/06/24   McDiarmid, Krystal BIRCH, MD  Lancets (ONETOUCH DELICA PLUS LANCET33G) MISC USE TO CHECK GLUCOSE IN THE MORNING AND AT BEDTIME. E11.9 02/06/23   Donah Laymon PARAS, MD  levocetirizine (  XYZAL ) 5 MG tablet Take 1 tablet (5 mg total) by mouth at bedtime. 01/26/24     levocetirizine (XYZAL ) 5 MG tablet Take 1 tablet (5 mg total) by mouth daily to 2 (two) times daily. 11/04/23     lidocaine  (LIDODERM ) 5 % Apply 1 patch onto the skin every 12 (twelve) hours. 07/09/23   Madelon Donald HERO, DO  lidocaine  (XYLOCAINE ) 5 % ointment Apply to affected area 3 to 4 times daily, not to exceed 5 g of ointment in single application. 08/28/23   Raulkar, Sven SQUIBB, MD  lidocaine  (XYLOCAINE ) 5 % ointment Apply to  affected area(s) 3 to 4 times daily-Do not exceed 5 grams of ointment in a single application. 12/10/23   Raulkar, Sven SQUIBB, MD  Multiple Vitamin (MULTIVITAMIN WITH MINERALS) TABS tablet Take 1 tablet by mouth daily.    [provider]  Multiple Vitamins-Minerals (ICAPS AREDS 2 PO) Take 1 tablet by mouth in the morning and at bedtime.    [provider]  ondansetron  (ZOFRAN -ODT) 4 MG disintegrating tablet Take 1 tablet (4 mg total) by mouth every 8 (eight) hours as needed for nausea or vomiting. 04/22/24   McDiarmid, Krystal BIRCH, MD  oxyCODONE -acetaminophen  (PERCOCET) 10-325 MG tablet Take 1 tablet by mouth every 4 (four) hours as needed for pain. 04/04/24   Debby Fidela CROME, NP  pantoprazole  (PROTONIX ) 40 MG tablet Take 1 tablet (40 mg total) by mouth daily. 08/28/23   Rumball, Alison M, DO  rosuvastatin  (CRESTOR ) 20 MG tablet Take 1 tablet (20 mg total) by mouth daily. 04/07/24   Madelon Donald HERO, DO  Suzetrigine  (JOURNAVX ) 50 MG TABS Take 1 tablet by mouth 2 (two) times daily as needed. 02/23/24   Raulkar, Sven SQUIBB, MD  triamcinolone  ointment (KENALOG ) 0.5 % Apply 1 application topically 2 (two) times daily. For elbows Patient taking differently: Apply 1 application  topically 2 (two) times daily as needed (elbow itching). 10/10/19   Scarlet Elsie LABOR, MD    Allergies: Amoxicillin, Azithromycin , Butalbital, Clindamycin  hcl, Januvia  [sitagliptin ], Levaquin  [levofloxacin  in d5w], Meperidine hcl, Sulfamethoxazole, Fexofenadine  hcl, Ketorolac , Macrodantin  [nitrofurantoin  macrocrystal], Methocarbamol , Nitrofurantoin , Pioglitazone , Tramadol , Codeine phosphate, Etodolac, Gabapentin , Naproxen, Pentazocine-naloxone  hcl, Sumatriptan, Trimethoprim , and Zonegran    Review of Systems  Respiratory:  Positive for shortness of breath.   Cardiovascular:  Positive for chest pain.    Updated Vital Signs BP 128/71   Pulse (!) 102   Temp 98.8 F (37.1 C) (Oral)   Resp 20   Ht 4' 11 (1.499 m)   Wt  80.6 kg   SpO2 97%   BMI 35.87 kg/m   Physical Exam Vitals and nursing note reviewed.  Constitutional:      General: She is not in acute distress.    Appearance: Normal appearance. She is not ill-appearing.  HENT:     Head: Normocephalic and atraumatic.  Eyes:     Conjunctiva/sclera: Conjunctivae normal.  Cardiovascular:     Rate and Rhythm: Tachycardia present.     Pulses: Normal pulses.          Radial pulses are 2+ on the right side and 2+ on the left side.       Dorsalis pedis pulses are 2+ on the right side and 2+ on the left side.     Heart sounds: Normal heart sounds.  Pulmonary:     Effort: Pulmonary effort is normal. No respiratory distress.     Breath sounds: Normal breath sounds.  Chest:     Chest wall:  Tenderness present.  Musculoskeletal:     Right lower leg: 1+ Pitting Edema present.     Left lower leg: 1+ Pitting Edema present.  Skin:    Coloration: Skin is not jaundiced or pale.  Neurological:     Mental Status: She is alert. Mental status is at baseline.     (all labs ordered are listed, but only abnormal results are displayed) Labs Reviewed  BASIC METABOLIC PANEL WITH GFR - Abnormal; Notable for the following components:      Result Value   Glucose, Bld 110 (*)    All other components within normal limits  TROPONIN I (HIGH SENSITIVITY) - Abnormal; Notable for the following components:   Troponin I (High Sensitivity) 18 (*)    All other components within normal limits  TROPONIN I (HIGH SENSITIVITY) - Abnormal; Notable for the following components:   Troponin I (High Sensitivity) 19 (*)    All other components within normal limits  RESP PANEL BY RT-PCR (RSV, FLU A&B, COVID)  RVPGX2  CBC WITH DIFFERENTIAL/PLATELET  BRAIN NATRIURETIC PEPTIDE  CBG MONITORING, ED    EKG: None  Radiology: CT Angio Chest PE W and/or Wo Contrast Result Date: 05/30/2024 CLINICAL DATA:  Chest pain, tachycardia and shortness of breath. EXAM: CT ANGIOGRAPHY CHEST WITH  CONTRAST TECHNIQUE: Multidetector CT imaging of the chest was performed using the standard protocol during bolus administration of intravenous contrast. Multiplanar CT image reconstructions and MIPs were obtained to evaluate the vascular anatomy. RADIATION DOSE REDUCTION: This exam was performed according to the departmental dose-optimization program which includes automated exposure control, adjustment of the mA and/or kV according to patient size and/or use of iterative reconstruction technique. CONTRAST:  70mL OMNIPAQUE  IOHEXOL  350 MG/ML SOLN COMPARISON:  Chest CT 04/16/2024 FINDINGS: Cardiovascular: The heart is borderline enlarged but stable. No pericardial effusion. Stable tortuosity, ectasia and calcification of the thoracic aorta but no dissection or focal aneurysm. Stable branch vessel ostial calcifications and scattered coronary artery calcifications. The pulmonary arterial trunk is well opacified. No filling defects to suggest pulmonary embolism. Mediastinum/Nodes: Mediastinal and hilar lymph nodes is have enlarged since the prior CT scan and are likely reactive/inflammatory given the lung findings. The esophagus is unremarkable. Lungs/Pleura: Interstitial thickening and diffuse but asymmetric areas of ground-glass opacity most likely reflecting pulmonary edema. Other possibilities would include atypical/viral pneumonia, reactive airways disease or hypersensitivity pneumonitis. No pleural effusion. No focal airspace consolidation. Stable 6 mm right middle lobe pulmonary nodule. Stable 3 mm left upper lobe pulmonary nodule. Upper Abdomen: No significant upper abdominal findings. Stable vascular calcifications. Stable left hepatic lobe cyst. Musculoskeletal: No significant bony findings. Review of the MIP images confirms the above findings. IMPRESSION: 1. No CT findings for pulmonary embolism. 2. Stable tortuosity, ectasia and calcification of the thoracic aorta but no dissection or focal aneurysm. 3.  Interstitial thickening and diffuse but asymmetric areas of ground-glass opacity most likely reflecting pulmonary edema. Other possibilities would include atypical/viral pneumonia, reactive airways disease or hypersensitivity pneumonitis. 4. Stable 6 mm right middle lobe pulmonary nodule and 3 mm left upper lobe pulmonary nodule. 5. Aortic atherosclerosis. Aortic Atherosclerosis (ICD10-I70.0). Electronically Signed   By: MYRTIS Stammer M.D.   On: 05/30/2024 17:36   DG Chest 2 View Result Date: 05/30/2024 CLINICAL DATA:  Chest pain. EXAM: CHEST - 2 VIEW COMPARISON:  04/16/2024 and CT chest 04/16/2024. FINDINGS: Trachea is midline. Heart is at the upper limits of normal in size. Lungs are clear. 6 mm right middle lobe nodule seen on CT  chest 04/16/2024 is poorly visualized. No pleural fluid. IMPRESSION: No acute findings. Electronically Signed   By: Newell Eke M.D.   On: 05/30/2024 14:56     .Critical Care  Performed by: Minnie Tinnie BRAVO, PA Authorized by: Minnie Tinnie BRAVO, PA   Critical care provider statement:    Critical care time (minutes):  31   Critical care was necessary to treat or prevent imminent or life-threatening deterioration of the following conditions:  Respiratory failure   Critical care was time spent personally by me on the following activities:  Development of treatment plan with patient or surrogate, interpretation of cardiac output measurements, obtaining history from patient or surrogate, ordering and performing treatments and interventions, ordering and review of laboratory studies, ordering and review of radiographic studies, pulse oximetry and re-evaluation of patient's condition   Care discussed with: admitting provider      Medications Ordered in the ED  morphine  (PF) 2 MG/ML injection 2 mg (has no administration in time range)  cefTRIAXone  (ROCEPHIN ) 1 g in sodium chloride  0.9 % 100 mL IVPB (has no administration in time range)  doxycycline  (VIBRA -TABS) tablet 100  mg (has no administration in time range)  iohexol  (OMNIPAQUE ) 350 MG/ML injection 75 mL (70 mLs Intravenous Contrast Given 05/30/24 1653)                                    Medical Decision Making Amount and/or Complexity of Data Reviewed Radiology: ordered.  Risk Prescription drug management. Decision regarding hospitalization.   Patient presents to the ED for concern of chest pain, shortness of breath, tachycardia, hypoxia, this involves an extensive number of treatment options, and is a complaint that carries with it a high risk of complications and morbidity.  The differential diagnosis includes COVID, flu, RSV, pneumonia, ACS, dissection, PE, COPD exacerbation, bronchitis, fluid overload   Co morbidities that complicate the patient evaluation  See HPI   Additional history obtained:  Additional history obtained from Nursing and Outside Medical Records   External records from outside source obtained and reviewed including triage note, PCP note from today, PT note from 05/23/2024   Lab Tests:  I Ordered, and personally interpreted labs.  The pertinent results include:   BMP WNL Initial troponin 18   Imaging Studies ordered:  I ordered imaging studies including CXR, CTPE  I independently visualized and interpreted imaging which showed  CXR: No acute cardiopulmonary pathology I agree with the radiologist interpretation   Cardiac Monitoring:  The patient was maintained on a cardiac monitor.  I personally viewed and interpreted the cardiac monitored which showed an underlying rhythm of: Sinus tachycardia   Medicines ordered and prescription drug management:  I ordered medication including morphine  for pain Reevaluation of the patient after these medicines showed that the patient improved I have reviewed the patients home medicines and have made adjustments as needed    Critical Interventions:  Resp failure requiring new o2 demand    Consultations  Obtained:  I requested consultation with hospitalist,  and discussed lab and imaging findings as well as pertinent plan - Dr. Lou accepts patient for admission   Problem List / ED Course:  Chest pain Shortness of breath Tachycardia No physical signs of severe respiratory distress.  Does look mildly winded with exertion, excessive talking.  O2 saturation ranges from 90-96% with good pleth at rest in ED Does not wear o2 at home. No nebulizers nor  inhalers at home EKG notable for sinus tachycardia.  No ST nor T wave abnormalities Initial troponin is mildly elevated at 18 which is lowest that she has had over the past 3 years.  Baseline 23-59 BNP WNL.  Mild 1+ pitting pedal edema bilaterally. Chest x-ray without fluid nor pneumonia.  Patient does not appear to be fluid overloaded. CTPE negative for PE but does show ground glass opacities which may reflect PNA vs effusion. Does not appear fluid overloaded. No elevated BNP. Very mild pedal edema Was maintaining oxygen saturation from 90-96 with good pleth.  Decreasing with prolonged periods of talking but quickly resolving.  However, when she walked to bathroom, desatted to sats in the 70s.  Was placed on 3 L O2 [place] which she is currently on Provided rocephin  and doxy for PNA. Has rash, pruitis allergy to azithromycin  Will admit for new oxygen requirement, hypoxia, pneumonia   Reevaluation:  After the interventions noted above, I reevaluated the patient and found that they have :stayed the same    Dispostion:  After consideration of the diagnostic results and the patients response to treatment, I feel that the patent would benefit from admission for IV abx for PNA and o2 supplementation.   Discussed ED workup, disposition with patient expressed understand agrees with plan.  All questions answered to her satisfaction.  She is agreeable to plan.  Final diagnoses:  Acute respiratory failure with hypoxia (HCC)  Pneumonia due to  infectious organism, unspecified laterality, unspecified part of lung    ED Discharge Orders     None        Minnie Tinnie BRAVO, PA 05/30/24 1954    Emil Share, DO 05/30/24 8040

## 2024-05-30 NOTE — Assessment & Plan Note (Signed)
 Monitored throughout appt with persistent hypoxia and tachycardia, concern for possible PE. Transporting to ED via EMS for evaluation. Report given to ED charge nurse.

## 2024-05-30 NOTE — Patient Instructions (Addendum)
 It was great to see you!  Our plans for today:  - I recommend going to the hospital TODAY to evaluate your chest pain.   - I will see you after your hospital visit  Take care and seek immediate care sooner if you develop any concerns.   Dr. Francelia Mclaren

## 2024-05-30 NOTE — H&P (Signed)
 History and Physical  [name] FMW:[id number] DOB: June 14, 1941 DOA: 05/30/2024  PCP: Madelon Donald HERO, DO   Chief Complaint: Shortness of breath, chest pain  HPI: [name] is a 83 y.o. female with medical history significant for T2DM, HTN, HLD, asthma, allergies, IBS, OSA, urinary incontinence, anxiety and depression, chronic back pain, chronic pain syndrome, and GERD who presented to the ED for evaluation of chest pain and shortness of breath.  ED Course: Initial vitals show temp 98.9, RR 16-22, HR 98-100s, SBP 100-1 30s, SpO2 97% on 2 L. Initial labs significant for normal CBC and BMP, troponin 18-19, BNP 26.8, negative flu, RSV and COVID test. EKG shows sinus rhythm with LVH. CXR shows no active disease.  CTA chest PE study negative for PE but shows interstitial thickening and diffuse but asymmetric areas of ground glass opacity.  Pt received IV Rocephin  and oral doxycycline . TRH was consulted for admission.   Review of Systems: Please see HPI for pertinent positives and negatives. A complete 10 system review of systems are otherwise negative.  Past Medical History:  Diagnosis Date   Asthma    Depression    Diabetes mellitus without complication (HCC)    History of surgery on arm    right arm ( plate and screws)   Hypertension    Macular degeneration    Migraine    without aura   Pneumonia    Urinary incontinence    Past Surgical History:  Procedure Laterality Date   ABDOMINAL HYSTERECTOMY     ?Lt. ovary remains   APPENDECTOMY     Arm surgery     BREAST BIOPSY Right 10/10/2009   BREAST BIOPSY Right 09/17/2022   MM RT BREAST BX W LOC DEV 1ST LESION IMAGE BX SPEC STEREO GUIDE 09/17/2022 [facility]   BREAST CYST ASPIRATION  12/20/2013   BREAST EXCISIONAL BIOPSY Left 1998   BREAST EXCISIONAL BIOPSY Right 1993   ESOPHAGOGASTRODUODENOSCOPY N/A 04/17/2021   Procedure: ESOPHAGOGASTRODUODENOSCOPY (EGD);  Surgeon: Kristie Lamprey, MD;  Location: [facility] ENDOSCOPY;  Service:  Endoscopy;  Laterality: N/A;  Rectal bleeding and anemia.   ESOPHAGOGASTRODUODENOSCOPY N/A 04/18/2021   Procedure: ESOPHAGOGASTRODUODENOSCOPY (EGD);  Surgeon: Rollin Dover, MD;  Location: [facility] ENDOSCOPY;  Service: Endoscopy;  Laterality: N/A;   HOT HEMOSTASIS N/A 04/18/2021   Procedure: HOT HEMOSTASIS (ARGON PLASMA COAGULATION/BICAP);  Surgeon: Rollin Dover, MD;  Location: [facility] ENDOSCOPY;  Service: Endoscopy;  Laterality: N/A;   IR ANGIOGRAM SELECTIVE EACH ADDITIONAL VESSEL  04/17/2021   IR ANGIOGRAM VISCERAL SELECTIVE  04/17/2021   IR EMBO ART  VEN HEMORR LYMPH EXTRAV  INC GUIDE ROADMAPPING  04/17/2021   IR US  GUIDE VASC ACCESS RIGHT  04/17/2021   RADIOLOGY WITH ANESTHESIA N/A 04/17/2021   Procedure: RADIOLOGY WITH ANESTHESIA;  Surgeon: Radiologist, Medication, MD;  Location: [facility] OR;  Service: Radiology;  Laterality: N/A;   SUBMUCOSAL INJECTION  04/18/2021   Procedure: SUBMUCOSAL INJECTION;  Surgeon: Rollin Dover, MD;  Location: [facility] ENDOSCOPY;  Service: Endoscopy;;   Social History:  reports that she quit smoking about 33 years ago. Her smoking use included cigarettes. She started smoking about 66 years ago. She has a 67.3 pack-year smoking history. She has never used smokeless tobacco. She reports that she does not drink alcohol and does not use drugs.  Allergies  Allergen Reactions   Amoxicillin Rash   Azithromycin  Rash    Itching/rash   Butalbital Other (See Comments)    Reaction: trouble breathing, but uncertain   Clindamycin  Hcl Rash   Januvia  [Sitagliptin ]  Other (See Comments)    Worsened migraines.   Levaquin  [Levofloxacin  In D5w] Diarrhea   Meperidine Hcl Other (See Comments)    REACTION: itching and hallucinations   Sulfamethoxazole Rash   Fexofenadine  Hcl Other (See Comments)    Lips pealing    Ketorolac  Swelling    Received ketorolac , methocarbamal, and lidocaine  patch all at the same time.  Lip swelling 12 hours later   Macrodantin  [Nitrofurantoin  Macrocrystal] Itching   Methocarbamol   Swelling    Received ketorolac , methocarbamal, and lidocaine  patch all at the same time.  Lip swelling 12 hours later   Nitrofurantoin  Itching   Pioglitazone  Other (See Comments)    Severe back pain and diffuse itching   Tramadol     Codeine Phosphate Other (See Comments)    REACTION: unknown   Etodolac Other (See Comments)    REACTION: stomach upset   Gabapentin  Other (See Comments)    REACTION: mental clouding  Does not want to try again even in low dose.   Naproxen Other (See Comments)    REACTION: stomach upset   Pentazocine-Naloxone  Hcl Other (See Comments)    REACTION: unspecified   Sumatriptan Other (See Comments)    REACTION: chest pain   Trimethoprim  Palpitations    SOB, tongue swelling   Zonegran Other (See Comments)    Reaction: Unknown    Family History  Problem Relation Age of Onset   Hypertension Mother    Hypertension Father    Hearing loss Sister    Heart attack Sister 19       s/p 4 bypasses    Stroke Brother        63   Breast cancer Maternal Aunt    Ovarian cancer Maternal Aunt      Prior to Admission medications   Medication Sig Start Date End Date Taking? Authorizing Provider  Artificial Tear Ointment (DRY EYES OP) Apply 1 drop to eye 2 (two) times daily as needed (dry eyes).    [provider]  benzonatate  (TESSALON ) 100 MG capsule Take 1 capsule (100 mg total) by mouth 2 (two) times daily as needed for cough. 12/11/23   Rumball, Alison M, DO  busPIRone  (BUSPAR ) 10 MG tablet Take 1 tablet (10 mg total) by mouth 3 (three) times daily. 12/31/23   Rumball, Alison M, DO  clobetasol  ointment (TEMOVATE ) 0.05 % Apply topically to vulva twice a week. 01/19/24   Marilynne Rosaline SAILOR, MD  DULoxetine  (CYMBALTA ) 60 MG capsule Take 1 capsule (60 mg total) by mouth daily. 08/28/23   Raulkar, Sven SQUIBB, MD  DULoxetine  (CYMBALTA ) 60 MG capsule Take 1 capsule (60 mg total) by mouth daily. 12/10/23   Raulkar, Sven SQUIBB, MD  enalapril  (VASOTEC ) 10 MG tablet Take 1  tablet (10 mg total) by mouth daily. 07/01/23   Rumball, Alison M, DO  estradiol  (ESTRACE ) 0.1 MG/GM vaginal cream Place 0.5g nightly for two weeks then twice a week after 03/13/22   Marilynne Rosaline SAILOR, MD  estradiol  (ESTRACE ) 0.1 MG/GM vaginal cream Place 0.5 g vaginally 2 (two) times a week. 01/21/24   Marilynne Rosaline SAILOR, MD  famotidine  (PEPCID ) 20 MG tablet Take 1 tablet (20 mg total) by mouth 2 (two) times daily. 03/08/24     famotidine  (PEPCID ) 40 MG tablet Take 1 tablet (40 mg total) by mouth daily. 02/05/24   Rumball, Alison M, DO  glucose blood (ONETOUCH VERIO) test strip USE 1 STRIP TO TEST 2 TIMES DAILY AS DIRECTED 04/07/24   Rumball, Alison M, DO  insulin  glargine (LANTUS ) 100 UNIT/ML Solostar Pen Inject 8 Units into the skin every morning. 05/06/24   McDiarmid, Krystal BIRCH, MD  Lancets [facility] DELICA PLUS LANCET33G) MISC USE TO CHECK GLUCOSE IN THE MORNING AND AT BEDTIME. E11.9 02/06/23   Donah Laymon PARAS, MD  levocetirizine (XYZAL ) 5 MG tablet Take 1 tablet (5 mg total) by mouth at bedtime. 01/26/24     levocetirizine (XYZAL ) 5 MG tablet Take 1 tablet (5 mg total) by mouth daily to 2 (two) times daily. 11/04/23     lidocaine  (LIDODERM ) 5 % Apply 1 patch onto the skin every 12 (twelve) hours. 07/09/23   Madelon Donald HERO, DO  lidocaine  (XYLOCAINE ) 5 % ointment Apply to affected area 3 to 4 times daily, not to exceed 5 g of ointment in single application. 08/28/23   Raulkar, Sven SQUIBB, MD  lidocaine  (XYLOCAINE ) 5 % ointment Apply to affected area(s) 3 to 4 times daily-Do not exceed 5 grams of ointment in a single application. 12/10/23   Raulkar, Sven SQUIBB, MD  Multiple Vitamin (MULTIVITAMIN WITH MINERALS) TABS tablet Take 1 tablet by mouth daily.    [provider]  Multiple Vitamins-Minerals (ICAPS AREDS 2 PO) Take 1 tablet by mouth in the morning and at bedtime.    [provider]  ondansetron  (ZOFRAN -ODT) 4 MG disintegrating tablet Take 1 tablet (4 mg total) by mouth every 8  (eight) hours as needed for nausea or vomiting. 04/22/24   McDiarmid, Krystal BIRCH, MD  oxyCODONE -acetaminophen  (PERCOCET) 10-325 MG tablet Take 1 tablet by mouth every 4 (four) hours as needed for pain. 04/04/24   Debby Fidela CROME, NP  pantoprazole  (PROTONIX ) 40 MG tablet Take 1 tablet (40 mg total) by mouth daily. 08/28/23   Rumball, Alison M, DO  rosuvastatin  (CRESTOR ) 20 MG tablet Take 1 tablet (20 mg total) by mouth daily. 04/07/24   Madelon Donald HERO, DO  Suzetrigine  (JOURNAVX ) 50 MG TABS Take 1 tablet by mouth 2 (two) times daily as needed. 02/23/24   Raulkar, Sven SQUIBB, MD  triamcinolone  ointment (KENALOG ) 0.5 % Apply 1 application topically 2 (two) times daily. For elbows Patient taking differently: Apply 1 application  topically 2 (two) times daily as needed (elbow itching). 10/10/19   Scarlet Elsie LABOR, MD    Physical Exam: BP 108/64   Pulse 99   Temp 98.8 F (37.1 C) (Oral)   Resp (!) 21   Ht 4' 11 (1.499 m)   Wt 80.6 kg   SpO2 94%   BMI 35.87 kg/m  General: Pleasant, well-appearing *** laying in bed. No acute distress. HEENT: [place]/AT. Anicteric sclera CV: RRR. No murmurs, rubs, or gallops. No LE edema Pulmonary: Lungs CTAB. Normal effort. No wheezing or rales. Abdominal: Soft, nontender, nondistended. Normal bowel sounds. Extremities: Palpable radial and DP pulses. Normal ROM. Skin: Warm and dry. No obvious rash or lesions. Neuro: A&Ox3. Moves all extremities. Normal sensation to light touch. No focal deficit. Psych: Normal mood and affect          Labs on Admission:  Basic Metabolic Panel: Recent Labs  Lab 05/30/24 1411  NA 137  K 4.8  CL 104  CO2 22  GLUCOSE 110*  BUN 14  CREATININE 0.81  CALCIUM  9.2   Liver Function Tests: No results for input(s): AST, ALT, ALKPHOS, BILITOT, PROT, ALBUMIN  in the last 168 hours. No results for input(s): LIPASE, AMYLASE in the last 168 hours. No results for input(s): AMMONIA in the last 168 hours. CBC: Recent Labs  Lab 05/30/24 1411  WBC 6.4  NEUTROABS 4.0  HGB 12.7  HCT 39.0  MCV 90.7  PLT 191   Cardiac Enzymes: No results for input(s): CKTOTAL, CKMB, CKMBINDEX, TROPONINI in the last 168 hours. BNP (last 3 results) Recent Labs    05/30/24 1411  BNP 26.8    ProBNP (last 3 results) No results for input(s): PROBNP in the last 8760 hours.  CBG: Recent Labs  Lab 05/30/24 1731  GLUCAP 88    Radiological Exams on Admission: CT Angio Chest PE W and/or Wo Contrast Result Date: 05/30/2024 CLINICAL DATA:  Chest pain, tachycardia and shortness of breath. EXAM: CT ANGIOGRAPHY CHEST WITH CONTRAST TECHNIQUE: Multidetector CT imaging of the chest was performed using the standard protocol during bolus administration of intravenous contrast. Multiplanar CT image reconstructions and MIPs were obtained to evaluate the vascular anatomy. RADIATION DOSE REDUCTION: This exam was performed according to the departmental dose-optimization program which includes automated exposure control, adjustment of the mA and/or kV according to patient size and/or use of iterative reconstruction technique. CONTRAST:  70mL OMNIPAQUE  IOHEXOL  350 MG/ML SOLN COMPARISON:  Chest CT 04/16/2024 FINDINGS: Cardiovascular: The heart is borderline enlarged but stable. No pericardial effusion. Stable tortuosity, ectasia and calcification of the thoracic aorta but no dissection or focal aneurysm. Stable branch vessel ostial calcifications and scattered coronary artery calcifications. The pulmonary arterial trunk is well opacified. No filling defects to suggest pulmonary embolism. Mediastinum/Nodes: Mediastinal and hilar lymph nodes is have enlarged since the prior CT scan and are likely reactive/inflammatory given the lung findings. The esophagus is unremarkable. Lungs/Pleura: Interstitial thickening and diffuse but asymmetric areas of ground-glass opacity most likely reflecting pulmonary edema. Other possibilities would include  atypical/viral pneumonia, reactive airways disease or hypersensitivity pneumonitis. No pleural effusion. No focal airspace consolidation. Stable 6 mm right middle lobe pulmonary nodule. Stable 3 mm left upper lobe pulmonary nodule. Upper Abdomen: No significant upper abdominal findings. Stable vascular calcifications. Stable left hepatic lobe cyst. Musculoskeletal: No significant bony findings. Review of the MIP images confirms the above findings. IMPRESSION: 1. No CT findings for pulmonary embolism. 2. Stable tortuosity, ectasia and calcification of the thoracic aorta but no dissection or focal aneurysm. 3. Interstitial thickening and diffuse but asymmetric areas of ground-glass opacity most likely reflecting pulmonary edema. Other possibilities would include atypical/viral pneumonia, reactive airways disease or hypersensitivity pneumonitis. 4. Stable 6 mm right middle lobe pulmonary nodule and 3 mm left upper lobe pulmonary nodule. 5. Aortic atherosclerosis. Aortic Atherosclerosis (ICD10-I70.0). Electronically Signed   By: MYRTIS Stammer M.D.   On: 05/30/2024 17:36   DG Chest 2 View Result Date: 05/30/2024 CLINICAL DATA:  Chest pain. EXAM: CHEST - 2 VIEW COMPARISON:  04/16/2024 and CT chest 04/16/2024. FINDINGS: Trachea is midline. Heart is at the upper limits of normal in size. Lungs are clear. 6 mm right middle lobe nodule seen on CT chest 04/16/2024 is poorly visualized. No pleural fluid. IMPRESSION: No acute findings. Electronically Signed   By: Newell Eke M.D.   On: 05/30/2024 14:56   Assessment/Plan Molli Gethers is a 83 y.o. female with medical history significant for  T2DM, HTN, HLD, asthma, allergies, IBS, OSA, urinary incontinence, anxiety and depression, chronic back pain, chronic pain syndrome, and GERD who presented to the ED for evaluation of chest pain and shortness of breath and admitted for acute hypoxic respiratory failure.  # Acute hypoxic respiratory failure - Presented with  *** - Found to have new O2 requirement of *** - This is secondary to *** -  Continue supplemental O2, wean as able  # Community-acquired pneumonia - Pt presented with *** - Chest imaging shows *** - Continue IV Rocephin  and azithromycin  - Mucinex  DM twice daily - Follow-up blood culture and sputum culture - Check MRSA screen, full RVP, procalcitonin, urinary Legionella and strep pneumo - Trend CBC, fever curve - Incentive spirometer, flutter valve - Supplemental O2 as needed  # HTN - BP stable with SBP in the 110-130s  #***  #***  # Chronic pain syndrome # Chronic back pain - Continue as needed Percocet  # Anxiety and depression -  DVT prophylaxis: Lovenox     Code Status: Limited: Do not attempt resuscitation (DNR) -DNR-LIMITED -Do Not Intubate/DNI   Consults called: None  Family Communication: ***  Severity of Illness: The appropriate patient status for this patient is INPATIENT. Inpatient status is judged to be reasonable and necessary in order to provide the required intensity of service to ensure the patient's safety. The patient's presenting symptoms, physical exam findings, and initial radiographic and laboratory data in the context of their chronic comorbidities is felt to place them at high risk for further clinical deterioration. Furthermore, it is not anticipated that the patient will be medically stable for discharge from the hospital within 2 midnights of admission.   * I certify that at the point of admission it is my clinical judgment that the patient will require inpatient hospital care spanning beyond 2 midnights from the point of admission due to high intensity of service, high risk for further deterioration and high frequency of surveillance required.*  Level of care: Telemetry Medical    Lou Claretta HERO, MD 05/30/2024, 10:18 PM [facility] Pager: [phone] Isaiah 41:10   If 7PM-7AM, please contact  night-coverage [email or web address] Password [email or web address]

## 2024-05-30 NOTE — ED Notes (Signed)
 Nt called CCMD@11 :38pm.

## 2024-05-30 NOTE — Progress Notes (Signed)
   SUBJECTIVE:   CHIEF COMPLAINT / HPI:   Discussed the use of AI scribe software for clinical note transcription with the patient, who gave verbal consent to proceed.  History of Present Illness [name] is an 83 year old female who presents with chest pain and low oxygen levels.  Chest pain and palpitations - Chest pain present for several days - Sensation of 'quivering' in the chest - Pain is localized to R sternal border - some difficulties breathing, nonproductive cough. Taking tessalon  perles. - seen at pain management doctor last week with tachycardia and low O2 saturation - some bilateral lower leg swelling. No difficulties walking more than usual.    OBJECTIVE:   BP 121/77   Pulse (!) 110   Wt 177 lb 9.6 oz (80.6 kg)   SpO2 (!) 85%   BMI 35.87 kg/m   Gen: well appearing, in NAD Card: reg rhythm, tachycardia Lungs: CTAB, no wheezing, rales. Ext: WWP, no edema  ASSESSMENT/PLAN:   Chest pain Monitored throughout appt with persistent hypoxia and tachycardia, concern for possible PE. Transporting to ED via EMS for evaluation. Report given to ED charge nurse.      Donald CHRISTELLA Lai, DO

## 2024-05-31 ENCOUNTER — Encounter (HOSPITAL_COMMUNITY): Payer: Self-pay | Admitting: Student

## 2024-05-31 DIAGNOSIS — J302 Other seasonal allergic rhinitis: Secondary | ICD-10-CM

## 2024-05-31 DIAGNOSIS — E119 Type 2 diabetes mellitus without complications: Secondary | ICD-10-CM

## 2024-05-31 DIAGNOSIS — J45901 Unspecified asthma with (acute) exacerbation: Secondary | ICD-10-CM

## 2024-05-31 DIAGNOSIS — J9601 Acute respiratory failure with hypoxia: Secondary | ICD-10-CM | POA: Diagnosis not present

## 2024-05-31 LAB — RESPIRATORY PANEL BY PCR

## 2024-05-31 LAB — CBC
HCT: 39.9 % (ref 36.0–46.0)
Hemoglobin: 12.1 g/dL (ref 12.0–15.0)
MCH: 29.7 pg (ref 26.0–34.0)
MCHC: 30.3 g/dL (ref 30.0–36.0)
MCV: 97.8 fL (ref 80.0–100.0)
Platelets: 138 K/uL — ABNORMAL LOW (ref 150–400)
RBC: 4.08 MIL/uL (ref 3.87–5.11)
RDW: 15 % (ref 11.5–15.5)
WBC: 5.3 K/uL (ref 4.0–10.5)
nRBC: 0 % (ref 0.0–0.2)

## 2024-05-31 LAB — COMPREHENSIVE METABOLIC PANEL WITH GFR
ALT: 20 U/L (ref 0–44)
AST: 34 U/L (ref 15–41)
Albumin: 3.2 g/dL — ABNORMAL LOW (ref 3.5–5.0)
Alkaline Phosphatase: 85 U/L (ref 38–126)
Anion gap: 9 (ref 5–15)
BUN: 11 mg/dL (ref 8–23)
CO2: 25 mmol/L (ref 22–32)
Calcium: 9.3 mg/dL (ref 8.9–10.3)
Chloride: 103 mmol/L (ref 98–111)
Creatinine, Ser: 0.87 mg/dL (ref 0.44–1.00)
GFR, Estimated: >60 mL/min (ref >60)
Glucose, Bld: 141 mg/dL — ABNORMAL HIGH (ref 70–99)
Potassium: 4.5 mmol/L (ref 3.5–5.1)
Sodium: 137 mmol/L (ref 135–145)
Total Bilirubin: 0.8 mg/dL (ref 0.0–1.2)
Total Protein: 6.5 g/dL (ref 6.5–8.1)

## 2024-05-31 LAB — GLUCOSE, CAPILLARY
Glucose-Capillary: 292 mg/dL — ABNORMAL HIGH (ref 70–99)
Glucose-Capillary: 272 mg/dL — ABNORMAL HIGH (ref 70–99)
Glucose-Capillary: 350 mg/dL — ABNORMAL HIGH (ref 70–99)
Glucose-Capillary: 357 mg/dL — ABNORMAL HIGH (ref 70–99)

## 2024-05-31 LAB — PROCALCITONIN: Procalcitonin: <0.1 ng/mL

## 2024-05-31 LAB — HEMOGLOBIN A1C
Hgb A1c MFr Bld: 10 % — ABNORMAL HIGH (ref 4.8–5.6)
Mean Plasma Glucose: 240.3 mg/dL

## 2024-05-31 LAB — MRSA NEXT GEN BY PCR, NASAL: MRSA by PCR Next Gen: NOT DETECTED

## 2024-05-31 MED ORDER — PANTOPRAZOLE SODIUM 40 MG PO TBEC
40.0000 mg | DELAYED_RELEASE_TABLET | Freq: Every day | ORAL | Status: DC
  Administered 2024-05-31 – 2024-06-02 (×3): 40 mg via ORAL
  Filled 2024-05-31 (×3): qty 1

## 2024-05-31 MED ORDER — INSULIN GLARGINE-YFGN 100 UNIT/ML ~~LOC~~ SOLN
8.0000 [IU] | Freq: Every day | SUBCUTANEOUS | Status: DC
  Administered 2024-05-31 – 2024-06-02 (×3): 8 [IU] via SUBCUTANEOUS
  Filled 2024-05-31 (×3): qty 0.08

## 2024-05-31 MED ORDER — ESTRADIOL 0.01 % VA CREA
0.5000 g | TOPICAL_CREAM | VAGINAL | Status: DC
  Administered 2024-06-02: 0.5 g via VAGINAL
  Filled 2024-05-31: qty 42.5

## 2024-05-31 MED ORDER — SODIUM CHLORIDE 0.9 % IV SOLN
1.0000 g | INTRAVENOUS | Status: DC
  Administered 2024-05-31: 1 g via INTRAVENOUS
  Filled 2024-05-31: qty 10

## 2024-05-31 MED ORDER — BUSPIRONE HCL 5 MG PO TABS
10.0000 mg | ORAL_TABLET | Freq: Three times a day (TID) | ORAL | Status: DC
  Administered 2024-05-31 – 2024-06-02 (×8): 10 mg via ORAL
  Filled 2024-05-31 (×4): qty 2
  Filled 2024-05-31: qty 1
  Filled 2024-05-31 (×3): qty 2

## 2024-05-31 MED ORDER — ENALAPRIL MALEATE 10 MG PO TABS
10.0000 mg | ORAL_TABLET | Freq: Every day | ORAL | Status: DC
  Administered 2024-05-31 – 2024-06-02 (×3): 10 mg via ORAL
  Filled 2024-05-31 (×3): qty 1

## 2024-05-31 MED ORDER — CETIRIZINE HCL 10 MG PO TABS
10.0000 mg | ORAL_TABLET | Freq: Every day | ORAL | Status: DC
  Administered 2024-05-31 – 2024-06-02 (×3): 10 mg via ORAL
  Filled 2024-05-31 (×3): qty 1

## 2024-05-31 MED ORDER — GUAIFENESIN 100 MG/5ML PO LIQD: 5.0000 mL | ORAL | Status: DC | PRN

## 2024-05-31 MED ORDER — FLUTICASONE FUROATE-VILANTEROL 100-25 MCG/ACT IN AEPB
1.0000 | INHALATION_SPRAY | Freq: Every day | RESPIRATORY_TRACT | Status: DC
Start: 2024-05-31 — End: 2024-06-02
  Administered 2024-05-31 – 2024-06-02 (×3): 1 via RESPIRATORY_TRACT
  Filled 2024-05-31: qty 28

## 2024-05-31 MED ORDER — PREDNISONE 20 MG PO TABS
40.0000 mg | ORAL_TABLET | Freq: Every day | ORAL | Status: DC
  Administered 2024-05-31 – 2024-06-02 (×3): 40 mg via ORAL
  Filled 2024-05-31 (×4): qty 2

## 2024-05-31 MED ORDER — INSULIN ASPART 100 UNIT/ML IJ SOLN
0.0000 [IU] | Freq: Three times a day (TID) | INTRAMUSCULAR | Status: DC
  Administered 2024-05-31 (×2): 8 [IU] via SUBCUTANEOUS
  Administered 2024-05-31: 11 [IU] via SUBCUTANEOUS
  Administered 2024-06-01: 2 [IU] via SUBCUTANEOUS
  Administered 2024-06-01: 3 [IU] via SUBCUTANEOUS
  Administered 2024-06-01: 11 [IU] via SUBCUTANEOUS

## 2024-05-31 MED ORDER — LORATADINE 10 MG PO TABS
10.0000 mg | ORAL_TABLET | Freq: Two times a day (BID) | ORAL | Status: DC
  Filled 2024-05-31: qty 1

## 2024-05-31 MED ORDER — DOXYCYCLINE HYCLATE 100 MG PO TABS
100.0000 mg | ORAL_TABLET | Freq: Two times a day (BID) | ORAL | Status: DC
  Administered 2024-05-31 – 2024-06-02 (×5): 100 mg via ORAL
  Filled 2024-05-31 (×5): qty 1

## 2024-05-31 MED ORDER — CETIRIZINE HCL 10 MG PO TABS
10.0000 mg | ORAL_TABLET | Freq: Every day | ORAL | Status: DC
Start: 2024-05-31 — End: 2024-05-31
  Administered 2024-05-31: 10 mg via ORAL
  Filled 2024-05-31 (×2): qty 1

## 2024-05-31 NOTE — Progress Notes (Signed)
 Progress Note   Patient: [name] FMW:[id number] DOB: 07-Aug-1941 DOA: 05/30/2024     1 DOS: the patient was seen and examined on 05/31/2024   Brief hospital course:  [name] is a 83 y.o. female with medical history significant for T2DM, HTN, HLD, asthma, allergies, IBS, OSA, urinary incontinence, anxiety and depression, chronic back pain, chronic pain syndrome, and GERD who presented to the ED for evaluation of chest pain and shortness of breath. Patient reports she started having pain in her right chest and right rib cage a week ago. She also reports progressive shortness of breath, dry cough and fatigue but no fevers, chills, abdominal pain, nausea, vomiting, orthopnea, dizziness or headache.  She was evaluated at her PCP office today, found to be tachycardic and hypoxic so she was sent to the ED via EMS. Patient reports chronic back pain which is currently at her baseline.   ED Course: Initial vitals show temp 98.9, RR 16-22, HR 98-100s, SBP 100-1 30s, SpO2 97% on 2 L. Initial labs significant for normal CBC and BMP, troponin 18-19, BNP 26.8, negative flu, RSV and COVID test. EKG shows sinus rhythm with LVH. CXR shows no active disease. CTA chest PE study negative for PE but shows interstitial thickening and diffuse but asymmetric areas of ground glass opacity. Pt received IV Rocephin  and oral doxycycline . TRH was consulted for admission.    10/21 -- pt remains on 2 L/min [place] O2.   Assessment and Plan:   # Acute hypoxic respiratory failure due to pneumonia and asthma exacerbation. --Remains on 2 L oxygen --Wean O2 as tolerated --Mgmt of PNA, asthma as below --Incentive spirometer & flutter valve   # Community-acquired pneumonia Presented with 1 week of cough and progressive shortness of breath.  CT chest with interstitial thickening and diffuse but asymmetric areas of ground glass opacity MRSA screen negative Negative Covid/flu/RSV and full RVP --Continue IV Rocephin    --Continue PO doxycycline  --Mucolytics --Follow sputum cx --Urine Legionella and strep pneumo pending collection --Trend CBC, fever curve   # Asthma exacerbation --Continue prednisone  40 mg daily --Started on daily ICS/LABA therapy --PRN DuoNebs  # Chest pain due costochondritis  due to coughing. Pt is musculoskeletal, reproducible on palpation.  No EKG changes, normal troponin. --Supportive care   # T2DM - uncontrolled Hbg A1c 10.0% - Last A1c 7.8% 6 months ago - Blood sugar 110 on admission - Semglee  8 units daily - SSI with meals - CBG monitoring   # HTN - BP stable with SBP in the 110-130s - Continue enalapril    # Allergies - Continue Xyzal    # HLD - Continue rosuvastatin    # Chronic pain syndrome # Chronic back pain - Continue as needed Percocet   # Anxiety and depression - Continue BuSpar    # GERD - Continue Protonix       Subjective: Pt seen awake sitting up in bed today. Reports reports feeling slightly better today. Still having chest pain, it worsens when she presses on it.  Continues to have a cough. No fever or chills.  Physical Exam: Vitals:   05/31/24 0530 05/31/24 0752 05/31/24 0839 05/31/24 0855  BP: (!) 152/61 120/66 (!) 144/74   Pulse: 95 95 92 65  Resp: 17 18 18 18   Temp:  98.3 F (36.8 C) 98.1 F (36.7 C)   TempSrc:  Oral Oral   SpO2: 100% 98% 96% 94%  Weight:      Height:       General exam: awake, alert, no  acute distress HEENT: atraumatic, clear conjunctiva, anicteric sclera, moist mucus membranes, hearing grossly normal  Respiratory system: CTAB with mild expiratory  wheezes, diminished, normal respiratory effort at rest on 2 L/min [place] o2. Cardiovascular system: normal S1/S2, RRR, no JVD, murmurs, rubs, gallops, no pedal edema.   Gastrointestinal system: soft, NT, ND, no HSM felt, +bowel sounds. Central nervous system: A&O x2+. no gross focal neurologic deficits, normal speech Extremities: moves all , no edema, normal  tone Skin: dry, intact, normal temperature Psychiatry: normal mood, congruent affect  Data Reviewed:  Notable labs -- glucose 141, albumin  3.2, otherwise normal CMP Platelets 138  Family Communication: none present, will attempt to call  Disposition: Status is: Inpatient Remains inpatient appropriate because: remains on oxygen, continuing IV antibiotics pending further improvement   Planned Discharge Destination: Home    Time spent: 42 minutes  Author: Burnard DELENA Cunning, DO 05/31/2024 1:58 PM  For on call review [email or web address].

## 2024-05-31 NOTE — TOC Initial Note (Signed)
 Transition of Care (TOC) - Initial/Assessment Note   Spoke to patient at bedside. From home alone, has son close by if she needs anything.   Patient has Roll[employer] no other DME.   Patient currently on room air, does not have home oxygen.   PCP Donald Lai.   No Inpatient Care Management needs identified at this time. Inpatient Care Management Team will continue to follow in progression rounds . If needs arise please enter consult   Patient Details  Name: [name] MRN: [id number] Date of Birth: [birth year]  Transition of Care [facility]) CM/SW Contact:    Stephane Powell Jansky, RN Phone Number: 05/31/2024, 2:41 PM  Clinical Narrative:                   Expected Discharge Plan: Home/Self Care Barriers to Discharge: Continued Medical Work up   Patient Goals and CMS Choice Patient states their goals for this hospitalization and ongoing recovery are:: to return to home   Choice offered to / list presented to : NA      Expected Discharge Plan and Services   Discharge Planning Services: CM Consult Post Acute Care Choice: NA Living arrangements for the past 2 months: Single Family Home                 DME Arranged: N/A DME Agency: NA       HH Arranged: NA HH Agency: NA        Prior Living Arrangements/Services Living arrangements for the past 2 months: Single Family Home Lives with:: Spouse Patient language and need for interpreter reviewed:: Yes Do you feel safe going back to the place where you live?: Yes      Need for Family Participation in Patient Care: Yes (Comment) Care giver support system in place?: Yes (comment) Current home services: DME Criminal Activity/Legal Involvement Pertinent to Current Situation/Hospitalization: No - Comment as needed  Activities of Daily Living      Permission Sought/Granted   Permission granted to share information with : No              Emotional Assessment Appearance:: Appears stated age Attitude/Demeanor/Rapport:  Engaged Affect (typically observed): Appropriate Orientation: : Oriented to Self, Oriented to Place, Oriented to  Time, Oriented to Situation Alcohol / Substance Use: Not Applicable Psych Involvement: No (comment)  Admission diagnosis:  Acute respiratory failure with hypoxia (HCC) [J96.01] Pneumonia due to infectious organism, unspecified laterality, unspecified part of lung [J18.9] Patient Active Problem List   Diagnosis Date Noted   Type 2 diabetes mellitus without complication, with long-term current use of insulin  (HCC) 05/31/2024   Seasonal allergies 05/31/2024   Exacerbation of asthma 05/31/2024   Acute respiratory failure with hypoxia (HCC) 05/30/2024   Elevated LFTs 12/11/2023   Concern about memory 12/11/2023   Pulmonary nodule 06/11/2023   Pyelonephritis 06/11/2023   AAA (abdominal aortic aneurysm) 05/23/2023   Head trauma 04/08/2023   Contusion of left knee 04/08/2023   Rhinorrhea 04/08/2023   Pain in left knee 04/03/2023   Leg swelling 10/19/2022   Pruritus 08/18/2022   Other fatigue 12/06/2021   Anxiety and depression 09/11/2021   Chronic pain syndrome    Rotator cuff syndrome of right shoulder 01/21/2019   Bladder prolapse, female, acquired 08/16/2018   Urinary incontinence 07/05/2018   Adjustment reaction with anxiety and depression 03/26/2017   Iron deficiency anemia due to chronic blood loss 10/31/2015   Pneumonia due to infectious organism 09/19/2015   Chronic right-sided thoracic back pain 07/26/2015  Spinal stenosis of lumbar region 07/10/2015   Abdominal aortic atherosclerosis 07/10/2015   Chest pain 09/12/2013   Sleep apnea 05/22/2010   Hereditary and idiopathic peripheral neuropathy 02/06/2010   Irritable bowel syndrome 11/12/2007   Diabetes mellitus type 2, controlled, with complications (HCC) 10/08/2006   HYPERCHOLESTEROLEMIA 10/08/2006   HYPERTRIGLYCERIDEMIA 10/08/2006   Migraine headache 10/08/2006   Essential hypertension 10/08/2006   Asthma  10/08/2006   Reflux esophagitis 10/08/2006   Peptic ulcer 10/08/2006   Eczema 10/08/2006   Psoriasis 10/08/2006   Osteoarthritis, multiple sites 10/08/2006   Dizziness 10/08/2006   PCP:  Madelon Donald HERO, DO Pharmacy:   [facility] [address] [place] [address] Phone: [phone] Fax: [phone]  [id number] - [place], [place] - [address] [place] [address] Phone: [phone] Fax: [phone]     Social Drivers of Health (SDOH) Social History: SDOH Screenings   Food Insecurity: No Food Insecurity (10/29/2023)  Housing: Unknown (10/29/2023)  Transportation Needs: No Transportation Needs (10/29/2023)  Utilities: Not At Risk (10/29/2023)  Alcohol Screen: Low Risk  (10/29/2023)  Depression (PHQ2-9): Low Risk  (04/04/2024)  Financial Resource Strain: Low Risk  (10/29/2023)  Physical Activity: Inactive (10/29/2023)  Social Connections: Moderately Integrated (10/29/2023)  Stress: No Stress Concern Present (10/29/2023)  Tobacco Use: Medium Risk (05/31/2024)  Health Literacy: Adequate Health Literacy (10/29/2023)   SDOH Interventions:     Readmission Risk Interventions     No data to display

## 2024-06-01 ENCOUNTER — Other Ambulatory Visit (HOSPITAL_COMMUNITY): Payer: Self-pay

## 2024-06-01 ENCOUNTER — Inpatient Hospital Stay (HOSPITAL_COMMUNITY)

## 2024-06-01 ENCOUNTER — Telehealth (HOSPITAL_COMMUNITY): Payer: Self-pay

## 2024-06-01 DIAGNOSIS — R011 Cardiac murmur, unspecified: Secondary | ICD-10-CM | POA: Diagnosis not present

## 2024-06-01 DIAGNOSIS — J9601 Acute respiratory failure with hypoxia: Secondary | ICD-10-CM | POA: Diagnosis not present

## 2024-06-01 DIAGNOSIS — Z789 Other specified health status: Secondary | ICD-10-CM

## 2024-06-01 LAB — ECHOCARDIOGRAM COMPLETE
Calc EF: 52.1 %
Height: 59 in
S' Lateral: 2.5 cm
Single Plane A2C EF: 53.7 %
Single Plane A4C EF: 51 %
Weight: 2841.61 [oz_av]

## 2024-06-01 LAB — GLUCOSE, CAPILLARY
Glucose-Capillary: 281 mg/dL — ABNORMAL HIGH (ref 70–99)
Glucose-Capillary: 123 mg/dL — ABNORMAL HIGH (ref 70–99)
Glucose-Capillary: 191 mg/dL — ABNORMAL HIGH (ref 70–99)
Glucose-Capillary: 326 mg/dL — ABNORMAL HIGH (ref 70–99)
Glucose-Capillary: 289 mg/dL — ABNORMAL HIGH (ref 70–99)

## 2024-06-01 LAB — TROPONIN I (HIGH SENSITIVITY)
Troponin I (High Sensitivity): 13 ng/L (ref <18)
Troponin I (High Sensitivity): 12 ng/L (ref <18)

## 2024-06-01 MED ORDER — OXYCODONE HCL 5 MG PO TABS
10.0000 mg | ORAL_TABLET | Freq: Four times a day (QID) | ORAL | Status: DC | PRN
Start: 2024-06-01 — End: 2024-06-02
  Administered 2024-06-01 (×2): 10 mg via ORAL
  Filled 2024-06-01 (×2): qty 2

## 2024-06-01 MED ORDER — INSULIN ASPART 100 UNIT/ML IJ SOLN
7.0000 [IU] | Freq: Once | INTRAMUSCULAR | Status: AC
  Administered 2024-06-01: 7 [IU] via SUBCUTANEOUS

## 2024-06-01 MED ORDER — CEFUROXIME AXETIL 250 MG PO TABS
500.0000 mg | ORAL_TABLET | Freq: Two times a day (BID) | ORAL | Status: DC
  Administered 2024-06-01 – 2024-06-02 (×2): 500 mg via ORAL
  Filled 2024-06-01 (×3): qty 2

## 2024-06-01 MED ORDER — LIDOCAINE 5 % EX PTCH
2.0000 | MEDICATED_PATCH | CUTANEOUS | Status: DC
  Administered 2024-06-01: 2 via TRANSDERMAL
  Filled 2024-06-01: qty 2

## 2024-06-01 MED ORDER — ROSUVASTATIN CALCIUM 20 MG PO TABS
20.0000 mg | ORAL_TABLET | Freq: Every day | ORAL | Status: DC
  Administered 2024-06-01 – 2024-06-02 (×2): 20 mg via ORAL
  Filled 2024-06-01 (×2): qty 1

## 2024-06-01 NOTE — Inpatient Diabetes Management (Addendum)
 Inpatient Diabetes Program Recommendations  AACE/ADA: New Consensus Statement on Inpatient Glycemic Control   Target Ranges:  Prepandial:   less than 140 mg/dL      Peak postprandial:   less than 180 mg/dL (1-2 hours)      Critically ill patients:  140 - 180 mg/dL   Lab Results  Component Value Date   GLUCAP 191 (H) 06/01/2024   HGBA1C 10.0 (H) 05/31/2024    Latest Reference Range & Units 05/31/24 10:36 05/31/24 13:07 05/31/24 18:32 05/31/24 20:44 06/01/24 04:04 06/01/24 07:53 06/01/24 11:49  Glucose-Capillary 70 - 99 mg/dL 707 (H) 727 (H) 649 (H) 357 (H) 281 (H) 123 (H) 191 (H)   Review of Glycemic Control  Diabetes history: DM2 Outpatient Diabetes medications:  Lantus  4 units daily   Current orders for Inpatient glycemic control:  Semglee  8 units daily  Novolog  0-15 units TID   Inpatient Diabetes Program Recommendations:   While patient is receiving steroids, please consider adding Novolog  3 units TID with meals (if patient consumes atleast 50% of meal)   Addendum: Spoke with patient at bedside about diabetes and home regimen for diabetes control. Patient reports being followed by Dr Madelon for diabetes management and taking Lantus  4 units daily prior to admission. Patient reports taking DM medications as prescribed. Patient reports checking glucose 2 times per day and that it is usually in the 130's mg/dl. Inquired about prior A1C and patient reports last A1C value was 7.8%. Discussed current A1C results 10% and explained that current A1C indicates an average glucose of 240 mg/dl over the past 2-3 months. Discussed glucose and A1C goals. Discussed importance of checking CBGs and maintaining good CBG control to prevent long-term and short-term complications. Explained how hyperglycemia leads to damage within blood vessels which lead to the common complications seen with uncontrolled diabetes. Discussed impact of nutrition, exercise, stress, sickness, and medications on diabetes  control.  Encouraged patient to check glucose 4 times per day (before meals and at bedtime.  I explained the option for CGM and offered to help patient set this up for her while she is inpatient (per Dr. Theophilus, okay to provide pt with samples). After education, patient does not wish to use CGM at this time but is aware how this could assist in better control of her diabetes. I made her aware she can start this at anytime outpatient - just needs to let Dr Madelon know.  Patient verbalized understanding of information discussed and reports no further questions at this time related to diabetes.   FreeStyle Libre 3 plus and the current 30 day co-pay is $0    Thanks,  Lavanda Search, RN, MSN, [facility]  Inpatient Diabetes Coordinator  Pager [phone] (8a-5p)

## 2024-06-01 NOTE — Progress Notes (Addendum)
 SATURATION QUALIFICATIONS: (This note is used to comply with regulatory documentation for home oxygen)  Patient Saturations on Room Air at Rest = 91%  Patient Saturations on Room Air while Ambulating = 86%  Patient Saturations on 1 Liters of oxygen while Ambulating = 93%  Please briefly explain why patient needs home oxygen:  Patient is unable to maintain an adequate oxygen saturation level while ambulating on room air.

## 2024-06-01 NOTE — Plan of Care (Signed)
 Problem: Education: Goal: Ability to describe self-care measures that may prevent or decrease complications (Diabetes Survival Skills Education) will improve 06/01/2024 2008 by Arius Harnois K, RN Outcome: Progressing 06/01/2024 1723 by Florian Dellis POUR, RN Outcome: Progressing Goal: Individualized Educational Video(s) 06/01/2024 2008 by Hadessah Grennan K, RN Outcome: Progressing 06/01/2024 1723 by Florian Dellis POUR, RN Outcome: Progressing   Problem: Coping: Goal: Ability to adjust to condition or change in health will improve 06/01/2024 2008 by Javanni Maring K, RN Outcome: Progressing 06/01/2024 1723 by Florian Dellis POUR, RN Outcome: Progressing   Problem: Fluid Volume: Goal: Ability to maintain a balanced intake and output will improve 06/01/2024 2008 by Florian Dellis POUR, RN Outcome: Progressing 06/01/2024 1723 by Florian Dellis POUR, RN Outcome: Progressing   Problem: Health Behavior/Discharge Planning: Goal: Ability to identify and utilize available resources and services will improve 06/01/2024 2008 by Edoardo Laforte K, RN Outcome: Progressing 06/01/2024 1723 by Florian Dellis POUR, RN Outcome: Progressing Goal: Ability to manage health-related needs will improve 06/01/2024 2008 by Florian Dellis POUR, RN Outcome: Progressing 06/01/2024 1723 by Florian Dellis POUR, RN Outcome: Progressing   Problem: Metabolic: Goal: Ability to maintain appropriate glucose levels will improve 06/01/2024 2008 by Florian Dellis POUR, RN Outcome: Progressing 06/01/2024 1723 by Florian Dellis POUR, RN Outcome: Progressing   Problem: Nutritional: Goal: Maintenance of adequate nutrition will improve 06/01/2024 2008 by Florian Dellis POUR, RN Outcome: Progressing 06/01/2024 1723 by Florian Dellis POUR, RN Outcome: Progressing Goal: Progress toward achieving an optimal weight will improve 06/01/2024 2008 by Florian Dellis POUR, RN Outcome: Progressing 06/01/2024 1723 by Florian Dellis POUR, RN Outcome:  Progressing   Problem: Skin Integrity: Goal: Risk for impaired skin integrity will decrease 06/01/2024 2008 by Florian Dellis POUR, RN Outcome: Progressing 06/01/2024 1723 by Florian Dellis POUR, RN Outcome: Progressing   Problem: Tissue Perfusion: Goal: Adequacy of tissue perfusion will improve 06/01/2024 2008 by Florian Dellis POUR, RN Outcome: Progressing 06/01/2024 1723 by Florian Dellis POUR, RN Outcome: Progressing   Problem: Education: Goal: Knowledge of General Education information will improve Description: Including pain rating scale, medication(s)/side effects and non-pharmacologic comfort measures 06/01/2024 2008 by Deserai Cansler K, RN Outcome: Progressing 06/01/2024 1723 by Florian Dellis POUR, RN Outcome: Progressing   Problem: Health Behavior/Discharge Planning: Goal: Ability to manage health-related needs will improve 06/01/2024 2008 by Derrick Orris K, RN Outcome: Progressing 06/01/2024 1723 by Florian Dellis POUR, RN Outcome: Progressing   Problem: Clinical Measurements: Goal: Ability to maintain clinical measurements within normal limits will improve 06/01/2024 2008 by Asiah Browder K, RN Outcome: Progressing 06/01/2024 1723 by Florian Dellis POUR, RN Outcome: Progressing Goal: Will remain free from infection 06/01/2024 2008 by Florian Dellis POUR, RN Outcome: Progressing 06/01/2024 1723 by Zorianna Taliaferro K, RN Outcome: Progressing Goal: Diagnostic test results will improve 06/01/2024 2008 by Zakiyah Diop K, RN Outcome: Progressing 06/01/2024 1723 by Atianna Haidar K, RN Outcome: Progressing Goal: Respiratory complications will improve 06/01/2024 2008 by Raiya Stainback K, RN Outcome: Progressing 06/01/2024 1723 by Aqsa Sensabaugh K, RN Outcome: Progressing Goal: Cardiovascular complication will be avoided 06/01/2024 2008 by Dawana Asper K, RN Outcome: Progressing 06/01/2024 1723 by Quanell Loughney K, RN Outcome: Progressing   Problem: Activity: Goal: Risk  for activity intolerance will decrease 06/01/2024 2008 by Jiah Bari K, RN Outcome: Progressing 06/01/2024 1723 by Ione Sandusky K, RN Outcome: Progressing   Problem: Nutrition: Goal: Adequate nutrition will be maintained 06/01/2024 2008 by Osmond Steckman K, RN Outcome: Progressing 06/01/2024 1723 by Daralyn Bert K, RN Outcome: Progressing  Problem: Coping: Goal: Level of anxiety will decrease 06/01/2024 2008 by Florian Dellis POUR, RN Outcome: Progressing 06/01/2024 1723 by Florian Dellis POUR, RN Outcome: Progressing   Problem: Elimination: Goal: Will not experience complications related to bowel motility 06/01/2024 2008 by Florian Dellis POUR, RN Outcome: Progressing 06/01/2024 1723 by Florian Dellis POUR, RN Outcome: Progressing Goal: Will not experience complications related to urinary retention 06/01/2024 2008 by Florian Dellis POUR, RN Outcome: Progressing 06/01/2024 1723 by Florian Dellis POUR, RN Outcome: Progressing   Problem: Pain Managment: Goal: General experience of comfort will improve and/or be controlled 06/01/2024 2008 by Zali Kamaka K, RN Outcome: Progressing 06/01/2024 1723 by Florian Dellis POUR, RN Outcome: Progressing   Problem: Safety: Goal: Ability to remain free from injury will improve 06/01/2024 2008 by Jadwiga Faidley K, RN Outcome: Progressing 06/01/2024 1723 by Florian Dellis POUR, RN Outcome: Progressing   Problem: Skin Integrity: Goal: Risk for impaired skin integrity will decrease 06/01/2024 2008 by Remy Dia K, RN Outcome: Progressing 06/01/2024 1723 by Joshva Labreck K, RN Outcome: Progressing   Problem: Activity: Goal: Ability to tolerate increased activity will improve 06/01/2024 2008 by Edwin Cherian K, RN Outcome: Progressing 06/01/2024 1723 by Raykwon Hobbs K, RN Outcome: Progressing   Problem: Clinical Measurements: Goal: Ability to maintain a body temperature in the normal range will improve 06/01/2024 2008 by Emillie Chasen,  Isadore Palecek K, RN Outcome: Progressing 06/01/2024 1723 by Adanya Sosinski K, RN Outcome: Progressing   Problem: Respiratory: Goal: Ability to maintain adequate ventilation will improve 06/01/2024 2008 by Keyerra Lamere K, RN Outcome: Progressing 06/01/2024 1723 by Corena Tilson K, RN Outcome: Progressing Goal: Ability to maintain a clear airway will improve 06/01/2024 2008 by Sally Menard K, RN Outcome: Progressing 06/01/2024 1723 by Cherylyn Sundby K, RN Outcome: Progressing

## 2024-06-01 NOTE — Assessment & Plan Note (Signed)
 Patient breathing comfortably on room air today.  Denies shortness of breath.  No wheezing on exam. - Prednisone  40 mg daily - Breo Ellipta 1 puff daily - DuoNebs every 4 hours as needed

## 2024-06-01 NOTE — Telephone Encounter (Signed)
 Pharmacy Patient Advocate Encounter  Insurance verification completed.    The patient is insured through [employer]/ [employer]. Patient has Medicare and is not eligible for a copay card, but may be able to apply for patient assistance or Medicare RX Payment Plan (Patient Must reach out to their plan, if eligible for payment plan), if available.    Ran test claim for [employer] 3 plus and the current 30 day co-pay is $0  Ran test claim for Breo Ellipta 100-25mcg and the current 30 day co-pay is $47.00  Ran test claim for Advair 250-50mcg and the current 30 day co-pay is $0   This test claim was processed through [employer]- copay amounts may vary at other pharmacies due to [employer], or as the patient moves through the different stages of their insurance plan.

## 2024-06-01 NOTE — Plan of Care (Signed)

## 2024-06-01 NOTE — Assessment & Plan Note (Signed)
 Reproducible on palpation.  Seems to be musculoskeletal. - EKG today looks similar to previous - Troponin 13, pending second troponin - Lidocaine  patches ordered for chest pain

## 2024-06-01 NOTE — Assessment & Plan Note (Signed)
>>  ASSESSMENT AND PLAN FOR TYPE 2 DIABETES MELLITUS WITHOUT COMPLICATION, WITH LONG-TERM CURRENT USE OF INSULIN  (HCC) WRITTEN ON 06/01/2024  3:22 PM BY BAKER, AMELIA G, DO  Hemoglobin A1c 10 yesterday.  Patient endorses not taking her home Lantus  to pharmacy team. - Insulin  glargine 8 units daily - Moderate SSI - Expect sugars to be a little higher while patient is on prednisone , will monitor while she is here

## 2024-06-01 NOTE — Progress Notes (Signed)
 Echocardiogram 2D Echocardiogram has been performed.  [name] 06/01/2024, 5:10 PM

## 2024-06-01 NOTE — Assessment & Plan Note (Signed)
 Essential hypertension - continue home enalapril  Anxiety/depression - continue home BuSpar  Seasonal allergies - continue home Xyzal  GERD - continue home Protonix 

## 2024-06-01 NOTE — Assessment & Plan Note (Signed)
 Hemoglobin A1c 10 yesterday.  Patient endorses not taking her home Lantus  to pharmacy team. - Insulin  glargine 8 units daily - Moderate SSI - Expect sugars to be a little higher while patient is on prednisone , will monitor while she is here

## 2024-06-01 NOTE — Progress Notes (Signed)
 Daily Progress Note Intern Pager: [phone]  Patient name: Kariana Wiles Medical record number: [id number] Date of birth: 08-05-1941 Age: 83 y.o. Gender: female  Primary Care Provider: Madelon Donald HERO, DO Consultants: None  Code Status: DNR-Limited   Pt Overview and Major Events to Date:  10/20: Admitted   Medical Decision Making:  Ardyth Kelso is an 83 y.o. female with a PMH of T2DM, HTN, HLD, asthma, allergies, IBS, OSA, urinary incontinence, anxiety and depression, chronic back pain, chronic pain syndrome, and GERD who presented for chest pain and shortness of breath.  He was initially admitted by [facility] hospitalist team and came to the family medicine teaching service today.  On prior imaging in September of this year patient was noted to have emphysema.  Her new oxygen goal is 88 to 92%.  She complained of chest pain today so we got an EKG and troponin.  Will continue treating her for pneumonia.  Assessment & Plan Acute respiratory failure with hypoxia (HCC) Emphysema seen on CTA PE 04/16/2024.  New oxygen goal 88 to 92%.  Patient satting well within this range on room air.   - Continue monitoring oxygen saturation - O2 goal 88 to 92% Pneumonia due to infectious organism Asymmetric areas of ground glass opacities and interstitial thickening seen on CTA PE. - IV Rocephin  and oral doxycycline  - Incision IV Rocephin  to oral cefuroxime 500 mg BID today Exacerbation of asthma Patient breathing comfortably on room air today.  Denies shortness of breath.  No wheezing on exam. - Prednisone  40 mg daily - Breo Ellipta 1 puff daily - DuoNebs every 4 hours as needed Type 2 diabetes mellitus without complication, with long-term current use of insulin  (HCC) Hemoglobin A1c 10 yesterday.  Patient endorses not taking her home Lantus  to pharmacy team. - Insulin  glargine 8 units daily - Moderate SSI - Expect sugars to be a little higher while patient is on prednisone , will monitor while she  is here Chest pain Reproducible on palpation.  Seems to be musculoskeletal. - EKG today looks similar to previous - Troponin 13, pending second troponin - Lidocaine  patches ordered for chest pain Chronic health problem Essential hypertension - continue home enalapril  Anxiety/depression - continue home BuSpar  Seasonal allergies - continue home Xyzal  GERD - continue home Protonix      FEN/GI: Carb modified PPx: Lovenox Dispo: Home pending clinical improvement.   Subjective:  Patient was seen lying in bed this morning.  She states she is breathing comfortably this morning.  Denies any shortness of breath.  She continues to have right sided chest pain.  She has no other complaints at this time.  Objective: Temp:  [98 F (36.7 C)-98.5 F (36.9 C)] 98 F (36.7 C) (10/22 0513) Pulse Rate:  [65-92] 82 (10/22 0513) Resp:  [17-18] 17 (10/22 0513) BP: (141-151)/(63-66) 151/63 (10/22 0513) SpO2:  [90 %-97 %] 90 % (10/22 0831) Physical Exam: General: NAD Cardiovascular: RRR, no M/R/G Respiratory: CTAB, normal work of breathing on room air Abdomen: Soft, nontender to palpation, nondistended Extremities: No edema bilaterally  Laboratory: Most recent CBC Lab Results  Component Value Date   WBC 5.3 05/31/2024   HGB 12.1 05/31/2024   HCT 39.9 05/31/2024   MCV 97.8 05/31/2024   PLT 138 (L) 05/31/2024   Most recent BMP    Latest Ref Rng & Units 05/31/2024    4:58 AM  BMP  Glucose 70 - 99 mg/dL 858   BUN 8 - 23 mg/dL 11   Creatinine 9.55 -  1.00 mg/dL 9.12   Sodium 864 - 854 mmol/L 137   Potassium 3.5 - 5.1 mmol/L 4.5   Chloride 98 - 111 mmol/L 103   CO2 22 - 32 mmol/L 25   Calcium  8.9 - 10.3 mg/dL 9.3    Imaging/Diagnostic Tests: No new imaging.  Lennie Raguel MATSU, DO 06/01/2024, 8:46 AM  PGY-1, [facility] Family Medicine FPTS Intern pager: [phone], text pages welcome Secure chat group [facility] Teaching Service

## 2024-06-01 NOTE — Assessment & Plan Note (Signed)
 Emphysema seen on CTA PE 04/16/2024.  New oxygen goal 88 to 92%.  Patient satting well within this range on room air.   - Continue monitoring oxygen saturation - O2 goal 88 to 92%

## 2024-06-01 NOTE — Assessment & Plan Note (Signed)
 Asymmetric areas of ground glass opacities and interstitial thickening seen on CTA PE. - IV Rocephin  and oral doxycycline  - Incision IV Rocephin  to oral cefuroxime 500 mg BID today

## 2024-06-01 NOTE — Plan of Care (Signed)

## 2024-06-02 ENCOUNTER — Other Ambulatory Visit (HOSPITAL_COMMUNITY): Payer: Self-pay

## 2024-06-02 DIAGNOSIS — J9601 Acute respiratory failure with hypoxia: Secondary | ICD-10-CM | POA: Diagnosis not present

## 2024-06-02 LAB — BASIC METABOLIC PANEL WITH GFR
Anion gap: 10 (ref 5–15)
BUN: 17 mg/dL (ref 8–23)
CO2: 23 mmol/L (ref 22–32)
Calcium: 9.4 mg/dL (ref 8.9–10.3)
Chloride: 105 mmol/L (ref 98–111)
Creatinine, Ser: 0.7 mg/dL (ref 0.44–1.00)
GFR, Estimated: >60 mL/min (ref >60)
Glucose, Bld: 166 mg/dL — ABNORMAL HIGH (ref 70–99)
Potassium: 3.9 mmol/L (ref 3.5–5.1)
Sodium: 138 mmol/L (ref 135–145)

## 2024-06-02 LAB — CBC
HCT: 35.9 % — ABNORMAL LOW (ref 36.0–46.0)
Hemoglobin: 11.4 g/dL — ABNORMAL LOW (ref 12.0–15.0)
MCH: 29.4 pg (ref 26.0–34.0)
MCHC: 31.8 g/dL (ref 30.0–36.0)
MCV: 92.5 fL (ref 80.0–100.0)
Platelets: 185 K/uL (ref 150–400)
RBC: 3.88 MIL/uL (ref 3.87–5.11)
RDW: 14.8 % (ref 11.5–15.5)
WBC: 7.3 K/uL (ref 4.0–10.5)
nRBC: 0 % (ref 0.0–0.2)

## 2024-06-02 LAB — GLUCOSE, CAPILLARY
Glucose-Capillary: 79 mg/dL (ref 70–99)
Glucose-Capillary: 156 mg/dL — ABNORMAL HIGH (ref 70–99)

## 2024-06-02 MED ORDER — LIDOCAINE 4 % EX PTCH
2.0000 | MEDICATED_PATCH | CUTANEOUS | 0 refills | Status: AC
  Filled 2024-06-02: qty 12, 6d supply, fill #0

## 2024-06-02 MED ORDER — INSULIN GLARGINE 100 UNIT/ML SOLOSTAR PEN
6.0000 [IU] | PEN_INJECTOR | SUBCUTANEOUS | 1 refills | Status: DC
  Filled 2024-06-02: qty 9, 84d supply, fill #0

## 2024-06-02 MED ORDER — CEFUROXIME AXETIL 500 MG PO TABS
500.0000 mg | ORAL_TABLET | Freq: Two times a day (BID) | ORAL | 0 refills | Status: AC
  Filled 2024-06-02: qty 3, 2d supply, fill #0

## 2024-06-02 MED ORDER — FLUTICASONE-SALMETEROL 115-21 MCG/ACT IN AERO
2.0000 | INHALATION_SPRAY | Freq: Two times a day (BID) | RESPIRATORY_TRACT | 12 refills | Status: AC
  Filled 2024-06-02: qty 12, 30d supply, fill #0

## 2024-06-02 MED ORDER — INSULIN PEN NEEDLE 32G X 4 MM MISC
0 refills | Status: AC
  Filled 2024-06-02: qty 100, 100d supply, fill #0

## 2024-06-02 MED ORDER — CLOBETASOL PROPIONATE 0.05 % EX CREA
TOPICAL_CREAM | Freq: Two times a day (BID) | CUTANEOUS | Status: DC | PRN
  Filled 2024-06-02: qty 15

## 2024-06-02 MED ORDER — DOXYCYCLINE HYCLATE 100 MG PO TABS
100.0000 mg | ORAL_TABLET | Freq: Two times a day (BID) | ORAL | 0 refills | Status: AC
  Filled 2024-06-02: qty 3, 2d supply, fill #0

## 2024-06-02 NOTE — Evaluation (Signed)
 Occupational Therapy Evaluation Patient Details Name: [name] MRN: [id number] DOB: 1941/04/17 Today's Date: 06/02/2024   History of Present Illness   [name] is an 83 y.o. female who presented with chest pain and shortness of breath. PMH - DM, HTN, asthma, depression, peripheral neuropathy, macular degeneration, chronic back pain     Clinical Impressions [name] evaluated s/p the above admission list. She lives alone and is mod I with rollator at baseline. Upon evaluation, pt demonstrated mod I ability to complete mobility and ADLs Pt Grant limited by SOB, activity tolerance and drop in O2. Pt has a pulse ox at home and knows to PLB when it is below 88%. Education provided on energy conservation, pt with great verbal recall. Pt does not require further acute, or follow up OT services. Recommend discharge back to pt's environment with assist as needed. OT to sign off with appreciation of order, please re-consult if needed.        If plan is discharge home, recommend the following:   Assist for transportation;Assistance with cooking/housework     Functional Status Assessment   Patient has had a recent decline in their functional status and demonstrates the ability to make significant improvements in function in a reasonable and predictable amount of time.       Precautions/Restrictions   Precautions Precautions: Fall Restrictions Weight Bearing Restrictions Per Provider Order: No     Mobility Bed Mobility Overal bed mobility: Needs Assistance Bed Mobility: Sit to Supine       Sit to supine: Independent        Transfers Overall transfer level: Modified independent                        Balance Overall balance assessment: No apparent balance deficits (not formally assessed)           ADL either performed or assessed with clinical judgement   ADL Overall ADL's : Modified independent;At baseline           General ADL Comments:  education provided on 5 Ps of energy conservation, pt mod I for ADLs with use of rollator     Vision Baseline Vision/History: 1 Wears glasses Vision Assessment?: No apparent visual deficits     Perception Perception: Within Functional Limits       Praxis Praxis: WFL       Pertinent Vitals/Pain Pain Assessment Pain Assessment: No/denies pain     Extremity/Trunk Assessment Upper Extremity Assessment Upper Extremity Assessment: Overall WFL for tasks assessed   Lower Extremity Assessment Lower Extremity Assessment: Overall WFL for tasks assessed   Cervical / Trunk Assessment Cervical / Trunk Assessment: Normal   Communication     Cognition Arousal: Alert Behavior During Therapy: WFL for tasks assessed/performed Cognition: No apparent impairments           Following commands: Intact       Cueing  General Comments   Cueing Techniques: Verbal cues  VSS on RA, SpO2 does drop into teh 80s with activity. Pt has a O2 monitor and recovers well with PLB           Home Living Family/patient expects to be discharged to:: Private residence Living Arrangements: Alone Available Help at Discharge: Family;Available PRN/intermittently Type of Home: House Home Access: Stairs to enter [employer] of Steps: 2 Entrance Stairs-Rails: None Home Layout: One level     Bathroom Shower/Tub: Chief Strategy Officer: Standard     Home Equipment:  Rollator (4 wheels);Shower seat;Hand held shower head   Additional Comments: son lives next door      Prior Functioning/Environment Prior Level of Function : Independent/Modified Independent             Mobility Comments: mod I wtih rollator ADLs Comments: mod I, does not drive    OT Problem List: Decreased activity tolerance;Decreased knowledge of precautions        OT Goals(Current goals can be found in the care plan section)   Acute Rehab OT Goals Patient Stated Goal: home OT Goal  Formulation: With patient Time For Goal Achievement: 06/02/24 Potential to Achieve Goals: Good      AM-PAC OT 6 Clicks Daily Activity     Outcome Measure Help from another person eating meals?: None Help from another person taking care of personal grooming?: None Help from another person toileting, which includes using toliet, bedpan, or urinal?: None Help from another person bathing (including washing, rinsing, drying)?: None Help from another person to put on and taking off regular upper body clothing?: None Help from another person to put on and taking off regular lower body clothing?: None 6 Click Score: 24   End of Session Equipment Utilized During Treatment: Rollator (4 wheels) Nurse Communication: Mobility status  Activity Tolerance: Patient tolerated treatment well Patient left: in bed;with call bell/phone within reach  OT Visit Diagnosis: Unsteadiness on feet (R26.81);Other abnormalities of gait and mobility (R26.89);Muscle weakness (generalized) (M62.81)                Time: 8948-8891 OT Time Calculation (min): 17 min Charges:  OT General Charges $OT Visit: 1 Visit OT Evaluation $OT Eval Low Complexity: 1 Low  [name], OTR/L Acute Rehabilitation Services Office [phone] Secure Chat Communication Preferred   [name] 06/02/2024, 12:15 PM

## 2024-06-02 NOTE — Progress Notes (Signed)
 Daily Progress Note Intern Pager: [phone]  Patient name: [name] Medical record number: [id number] Date of birth: 1940/09/06 Age: 83 y.o. Gender: female  Primary Care Provider: Madelon Donald HERO, DO Consultants: None  Code Status: DNR-Limited   Pt Overview and Major Events to Date:  10/20: Admitted   Medical Decision Making:  [name] is an 83 y.o. female who presented with chest pain and shortness of breath. She was initially admitted by [facility] hospitalist team, but 10/22 the family medicine teaching service assumed care as she follows with our clinic. She is being treated for pneumonia. Work up for chest pain yesterday was unremarkable for cardiac etiology. Pertinent PMH/PSH includes T2DM, HTN, HLD, asthma, allergies, IBS, OSA, urinary incontinence, anxiety and depression, chronic back pain, chronic pain syndrome, and GERD.  Assessment & Plan Acute respiratory failure with hypoxia (HCC) Emphysema seen on CTA PE 04/16/2024.  New oxygen goal 88 to 92%.  Patient satting well within this range on room air.   - Continue monitoring oxygen saturation - O2 goal 88 to 92% Pneumonia due to infectious organism Asymmetric areas of ground glass opacities and interstitial thickening seen on CTA PE. - IV Rocephin  and oral doxycycline  - Incision IV Rocephin  to oral cefuroxime 500 mg BID today Exacerbation of asthma Patient breathing comfortably on room air today.  Denies shortness of breath.  No wheezing on exam. - Prednisone  40 mg daily - Breo Ellipta 1 puff daily - DuoNebs every 4 hours as needed Type 2 diabetes mellitus without complication, with long-term current use of insulin  (HCC) Hemoglobin A1c 10 yesterday.  Patient endorses not taking her home Lantus  to pharmacy team. - Insulin  glargine 8 units daily - Moderate SSI - Expect sugars to be a little higher while patient is on prednisone , will monitor while she is here Chest pain Reproducible on palpation.  Seems to be  musculoskeletal. Echo showed LVEF of 60-65%, moderate asymmetric LV hypertrophy, calcified aortic valve with mildly restricted leaflets.  - EKG today looks similar to previous - Troponin 13, 12 - Lidocaine  patches ordered for chest pain Chronic health problem Essential hypertension - continue home enalapril  10 mg  Anxiety/depression - continue home BuSpar  10 mg  Seasonal allergies - continue cetrizine 10 mg  GERD - continue home Protonix  40 mg HLD - continue home rosuvastatin  20 mg      FEN/GI: Carb modified  PPx: Lovenox  Dispo: Home pending clinical improvement.   Subjective:  Patient seen lying in bed this morning.  She states she is continuing to breathe well this morning.  Her chest pain is completely gone this morning.  She states the lidocaine  patches helped her chest pain.  She continues to cough, feels like it may be breaking up in her chest some but is still not coughing up any sputum.  She is ready to go home.  Objective: Temp:  [98 F (36.7 C)-98.3 F (36.8 C)] 98.3 F (36.8 C) (10/23 0447) Pulse Rate:  [88-101] 89 (10/23 0451) Resp:  [16-19] 17 (10/23 0451) BP: (128-173)/(56-82) 156/70 (10/23 0451) SpO2:  [90 %-100 %] 93 % (10/23 0451) Physical Exam: General: NAD Cardiovascular: RRR, no M/R/G Respiratory: CTAB, normal work of breathing on room air, coughing with deep breathing Abdomen: Soft, nondistended, nontender to palpation Extremities: No edema  Laboratory: Most recent CBC Lab Results  Component Value Date   WBC 7.3 06/02/2024   HGB 11.4 (L) 06/02/2024   HCT 35.9 (L) 06/02/2024   MCV 92.5 06/02/2024   PLT  185 06/02/2024   Most recent BMP    Latest Ref Rng & Units 06/02/2024    2:30 AM  BMP  Glucose 70 - 99 mg/dL 833   BUN 8 - 23 mg/dL 17   Creatinine 9.55 - 1.00 mg/dL 9.29   Sodium 864 - 854 mmol/L 138   Potassium 3.5 - 5.1 mmol/L 3.9   Chloride 98 - 111 mmol/L 105   CO2 22 - 32 mmol/L 23   Calcium  8.9 - 10.3 mg/dL 9.4     Other pertinent  labs: Troponin I 13 -->12   Imaging/Diagnostic Tests: ECHO 1. Left ventricular ejection fraction, by estimation, is 60 to 65%. The  left ventricle has normal function. The left ventricle has no regional  wall motion abnormalities. There is moderate asymmetric left ventricular  hypertrophy of the basal-septal  segment. Indeterminate diastolic filling due to E-A fusion.   2. Right ventricular systolic function is normal. The right ventricular  size is normal.   3. The mitral valve is grossly normal. No evidence of mitral valve  regurgitation. No evidence of mitral stenosis.   4. The aortic valve is calcified. There is moderate calcification of the  aortic valve. Aortic valve regurgitation is not visualized. Aortic valve  leaflets appear to have mildly restricted motion, but aortic valve  gradient not well captured.   5. The inferior vena cava is normal in size with greater than 50%  respiratory variability, suggesting right atrial pressure of 3 mmHg.   Lennie Raguel MATSU, DO 06/02/2024, 7:57 AM  PGY-1, [employer] [facility] Family Medicine FPTS Intern pager: [phone], text pages welcome Secure chat group [facility] Teaching Service

## 2024-06-02 NOTE — Progress Notes (Signed)
 PT Cancellation Note  Patient Details Name: Ineta Sinning MRN: [id number] DOB: [birth year]   Cancelled Treatment:    Reason Eval/Treat Not Completed: PT screened, no needs identified, will sign off (Per OT, pt at her baseline - modI with a rollator.)  Randall SAUNDERS, PT, DPT Acute Rehabilitation Services Office: [phone] Secure Chat Preferred  Delon CHRISTELLA Callander 06/02/2024, 11:01 AM

## 2024-06-02 NOTE — Assessment & Plan Note (Signed)
>>  ASSESSMENT AND PLAN FOR TYPE 2 DIABETES MELLITUS WITHOUT COMPLICATION, WITH LONG-TERM CURRENT USE OF INSULIN  (HCC) WRITTEN ON 06/02/2024  8:09 AM BY BAKER, AMELIA G, DO  Hemoglobin A1c 10 yesterday.  Patient endorses not taking her home Lantus  to pharmacy team. - Insulin  glargine 8 units daily - Moderate SSI - Expect sugars to be a little higher while patient is on prednisone , will monitor while she is here

## 2024-06-02 NOTE — Assessment & Plan Note (Signed)
 Patient breathing comfortably on room air today.  Denies shortness of breath.  No wheezing on exam. - Prednisone  40 mg daily - Breo Ellipta 1 puff daily - DuoNebs every 4 hours as needed

## 2024-06-02 NOTE — Plan of Care (Signed)
   Problem: Coping: Goal: Ability to adjust to condition or change in health will improve Outcome: Progressing

## 2024-06-02 NOTE — Assessment & Plan Note (Signed)
 Asymmetric areas of ground glass opacities and interstitial thickening seen on CTA PE. - IV Rocephin  and oral doxycycline  - Incision IV Rocephin  to oral cefuroxime 500 mg BID today

## 2024-06-02 NOTE — Progress Notes (Signed)
 Nurse requested Mobility Specialist to perform oxygen saturation test with pt which includes removing pt from oxygen both at rest and while ambulating.  Below are the results from that testing.     Patient Saturations on Room Air at Rest = spO2 98%  Patient Saturations on Room Air while Ambulating = sp02 79% .  Rested and performed pursed lip breathing for 1 minute with sp02 at 82%.  Patient Saturations on 3 Liters of oxygen while Ambulating = sp02 95%  At end of testing pt left in room on 0 Liters of oxygen.  Reported results to nurse.   Lavanda Pollack Mobility Specialist  Please contact via [employer] or  Rehab Office [phone]

## 2024-06-02 NOTE — TOC Progression Note (Signed)
 Transition of Care (TOC) - Progression Note   Ordered oxygen with [employer] . Secure chatted team regarding note and order , once updated [employer] will submit to insurance .   Patient Details  Name: [name] MRN: [id number] Date of Birth: 09/04/1940  Transition of Care [facility]) CM/SW Contact  Hanya Guerin, Powell Jansky, RN Phone Number: 06/02/2024, 12:46 PM  Clinical Narrative:       Expected Discharge Plan: Home/Self Care Barriers to Discharge: Continued Medical Work up               Expected Discharge Plan and Services   Discharge Planning Services: CM Consult Post Acute Care Choice: NA Living arrangements for the past 2 months: Single Family Home                 DME Arranged: N/A DME Agency: NA       HH Arranged: NA HH Agency: NA         Social Drivers of Health (SDOH) Interventions SDOH Screenings   Food Insecurity: No Food Insecurity (05/31/2024)  Housing: Low Risk  (05/31/2024)  Transportation Needs: No Transportation Needs (05/31/2024)  Utilities: Not At Risk (05/31/2024)  Alcohol Screen: Low Risk  (10/29/2023)  Depression (PHQ2-9): Low Risk  (04/04/2024)  Financial Resource Strain: Low Risk  (10/29/2023)  Physical Activity: Inactive (10/29/2023)  Social Connections: Moderately Integrated (05/31/2024)  Stress: No Stress Concern Present (10/29/2023)  Tobacco Use: Medium Risk (05/31/2024)  Health Literacy: Adequate Health Literacy (10/29/2023)    Readmission Risk Interventions     No data to display

## 2024-06-02 NOTE — Assessment & Plan Note (Addendum)
 Essential hypertension - continue home enalapril  10 mg  Anxiety/depression - continue home BuSpar  10 mg  Seasonal allergies - continue cetrizine 10 mg  GERD - continue home Protonix  40 mg HLD - continue home rosuvastatin  20 mg

## 2024-06-02 NOTE — Progress Notes (Deleted)
 Nurse requested Mobility Specialist to perform oxygen saturation test with pt which includes removing pt from oxygen both at rest and while ambulating.  Below are the results from that testing.     Patient Saturations on Room Air at Rest = spO2 95%  Patient Saturations on Room Air while Ambulating = sp02 82% .  Rested and performed pursed lip breathing for 1 minute with sp02 at 95%.  At end of testing pt left in room on 0 Liters of oxygen.  Reported results to nurse.   Lavanda Pollack Mobility Specialist  Please contact via [employer] or  Rehab Office [phone]

## 2024-06-02 NOTE — Assessment & Plan Note (Signed)
 Hemoglobin A1c 10 yesterday.  Patient endorses not taking her home Lantus  to pharmacy team. - Insulin  glargine 8 units daily - Moderate SSI - Expect sugars to be a little higher while patient is on prednisone , will monitor while she is here

## 2024-06-02 NOTE — Assessment & Plan Note (Signed)
 Emphysema seen on CTA PE 04/16/2024.  New oxygen goal 88 to 92%.  Patient satting well within this range on room air.   - Continue monitoring oxygen saturation - O2 goal 88 to 92%

## 2024-06-02 NOTE — Hospital Course (Addendum)
 [name] is a 83 y.o. female who was admitted to the [facility] Medicine Teaching Service at [facility] for acute respiratory failure with hypoxia. Hospital course is outlined below by problem.   Acute respiratory failure with hypoxia Community-acquired pneumonia This patient was experiencing SOB, dry cough, and fatigue.  She presented to her PCP office and was found to be tachycardic and hypoxic and was sent to the ED.  Presented to the ED with chest pain and shortness of breath.  Patient was afebrile, but requiring 10 mL of oxygen on admission.  Chest x-ray showed no active disease.  CTA PE was negative for PE but showed interstitial thickening and diffuse but asymmetric areas of ground glass opacity.  Treatment was started for pneumonia with IV Rocephin  and oral doxycycline .  On 10/22 IV Rocephin  was de-escalated to oral cefuroxime.  It was found that on the CT a PE on 04/16/2024 patient was noted to have emphysema.  Therefore patient has oxygen goal is now 88-92%.  With patient's new oxygen saturation goal, she continued to saturate well on room air.  Asthma exacerbation Patient with SOB, cough, and mild wheezing on admission.  Daily prednisone  was started along with Breo Ellipta and DuoNebs as needed.  Patient had no wheezing on day of discharge.  T2DM A1c this admission of 10.  Patient was on 8 units daily of Lantus .  However, she admitted that she had stopped taking it as her sugars have been well-controlled.  Restarted patient on long-acting 8 units daily while here and patient and moderate SSI.  Encouraged patient to continue her home insulin  and follow PCP recommendations.  Chest pain Patient complained of right sided chest pain this admission.  Reproducible to palpation.  To rule out cardiac etiology and EKG, troponin, and echo were obtained.  EKG was negative for ST elevation.  Troponins were flat at 13 and then 12.  Echo showed LVEF of 60 to 65% with normal LV function, moderate asymmetric LV  hypertrophy and basal septal segment.  Aortic valve calcification with mildly restricted leaflet motion.  Lidocaine  patches were given for patient's chest pain and the pain completely resolved.   Other conditions that were chronic and stable: HTN, allergies, anxiety/depression, chronic pain/chronic back pain, GERD.  Issues for follow up: Ensure patient completes antibiotic course. Reassess respiratory status Consider PFT for formal COPD diagnosis Follow up on A1c and adjust diabetes regimen as indicated Patient was interested in some home PT, none recommended this admission. Check in with patient on balance.

## 2024-06-02 NOTE — Inpatient Diabetes Management (Signed)
 Inpatient Diabetes Program Recommendations  AACE/ADA: New Consensus Statement on Inpatient Glycemic Control (2015)  Target Ranges:  Prepandial:   less than 140 mg/dL      Peak postprandial:   less than 180 mg/dL (1-2 hours)      Critically ill patients:  140 - 180 mg/dL   Lab Results  Component Value Date   GLUCAP 79 06/02/2024   HGBA1C 10.0 (H) 05/31/2024    Diabetes history: DM2 Outpatient Diabetes medications:  Lantus  4 units daily    Current orders for Inpatient glycemic control:  Semglee  8 units daily  Novolog  0-15 units TID   Inpatient Diabetes Program Recommendations:   Please consider while on steroids: -Add Novolog  3 units tid meal coverage if eats 50% -Decrease Novolog  correction to 0-9 units tid  Thank you, Autie Vasudevan E. Sheridyn Canino, RN, MSN, CNS, CDCES  Diabetes Coordinator Inpatient Glycemic Control Team Team Pager [phone] (8am-5pm) 06/02/2024 11:42 AM

## 2024-06-02 NOTE — Progress Notes (Addendum)
 Mobility Specialist Progress Note:    06/02/24 1052  Mobility  Activity Ambulated with assistance (In hallway)  Level of Assistance Standby assist, set-up cues, supervision of patient - no hands on  Assistive Device Four wheel walker  Distance Ambulated (ft) 140 ft  Activity Response Tolerated well  Mobility Referral Yes  Mobility visit 1 Mobility  Mobility Specialist Start Time (ACUTE ONLY) 1043  Mobility Specialist Stop Time (ACUTE ONLY) 1051  Mobility Specialist Time Calculation (min) (ACUTE ONLY) 8 min   Received pt in bed and eager for mobility. No physical assistance required. Pt's SPO2 was 82%-95% during ambulation. Pt took x2 standing rest breaks to increase O2 w/ PLB. No c/o. Returned to room without fault. Left pt EOB w/ OT present. All needs met.  Lavanda Pollack Mobility Specialist  Please contact via [employer] or  Rehab Office [phone]

## 2024-06-02 NOTE — Assessment & Plan Note (Signed)
 Reproducible on palpation.  Seems to be musculoskeletal. Echo showed LVEF of 60-65%, moderate asymmetric LV hypertrophy, calcified aortic valve with mildly restricted leaflets.  - EKG today looks similar to previous - Troponin 13, 12 - Lidocaine  patches ordered for chest pain

## 2024-06-02 NOTE — Discharge Summary (Cosign Needed Addendum)
 Family Medicine Teaching [facility] Discharge Summary  Patient name: [name] Medical record number: [id number] Date of birth: [birth year] Age: 83 y.o. Gender: female Date of Admission: 05/30/2024  Date of Discharge: 06/02/2024 Admitting Physician: Claretta CHRISTELLA Alderman, MD  Primary Care Provider: Madelon Donald CHRISTELLA, DO Consultants: None  Indication for Hospitalization: Acute respiratory failure with hypoxia  Discharge Diagnoses/Problem List:  Principal Problem for Admission: Pneumonia Other Problems addressed during stay:  Principal Problem:   Acute respiratory failure with hypoxia (HCC) Active Problems:   Essential hypertension   Chest pain   Pneumonia due to infectious organism   Anxiety and depression   Type 2 diabetes mellitus without complication, with long-term current use of insulin  (HCC)   Seasonal allergies   Exacerbation of asthma   Brief Hospital Course:  [name] is a 83 y.o. female who was admitted to the [facility] Medicine Teaching Service at [facility] for acute respiratory failure with hypoxia. Hospital course is outlined below by problem.   Acute respiratory failure with hypoxia Community-acquired pneumonia This patient was experiencing SOB, dry cough, and fatigue.  She presented to her PCP office and was found to be tachycardic and hypoxic and was sent to the ED.  Presented to the ED with chest pain and shortness of breath.  Patient was afebrile, but requiring 10 mL of oxygen on admission.  Chest x-ray showed no active disease.  CTA PE was negative for PE but showed interstitial thickening and diffuse but asymmetric areas of ground glass opacity.  Treatment was started for pneumonia with IV Rocephin  and oral doxycycline .  On 10/22 IV Rocephin  was de-escalated to oral cefuroxime.  It was found that on the CT a PE on 04/16/2024 patient was noted to have emphysema.  Therefore patient has oxygen goal is now 88-92%.  With patient's new oxygen saturation goal, she  continued to saturate well on room air.  Asthma exacerbation Patient with SOB, cough, and mild wheezing on admission.  Daily prednisone  was started along with Breo Ellipta and DuoNebs as needed.  Patient had no wheezing on day of discharge.  T2DM A1c this admission of 10.  Patient was on 8 units daily of Lantus .  However, she admitted that she had stopped taking it as her sugars have been well-controlled.  Restarted patient on long-acting 8 units daily while here and patient and moderate SSI.  Encouraged patient to continue her home insulin  and follow PCP recommendations.  Chest pain Patient complained of right sided chest pain this admission.  Reproducible to palpation.  To rule out cardiac etiology and EKG, troponin, and echo were obtained.  EKG was negative for ST elevation.  Troponins were flat at 13 and then 12.  Echo showed LVEF of 60 to 65% with normal LV function, moderate asymmetric LV hypertrophy and basal septal segment.  Aortic valve calcification with mildly restricted leaflet motion.  Lidocaine  patches were given for patient's chest pain and the pain completely resolved.   Other conditions that were chronic and stable: HTN, allergies, anxiety/depression, chronic pain/chronic back pain, GERD.  Issues for follow up: Ensure patient completes antibiotic course. Reassess respiratory status Consider PFT for formal COPD diagnosis Follow up on A1c and adjust diabetes regimen as indicated Patient was interested in some home PT, none recommended this admission. Check in with patient on balance.       Results/Tests Pending at Time of Discharge:  Unresulted Labs (From admission, onward)     Start     Ordered   05/30/24 2010  Expectorated Sputum Assessment w Gram Stain, Rflx to Resp Cult  (COPD / Pneumonia / Cellulitis / Lower Extremity Wound (Diabetic Foot Infection))  Once,   R        05/30/24 2010             Disposition: Home  Discharge Condition: Medically stable for  discharge  Discharge Exam:  Vitals:   06/02/24 0451 06/02/24 1108  BP: (!) 156/70 (!) 151/75  Pulse: 89 85  Resp: 17 18  Temp:  98.1 F (36.7 C)  SpO2: 93% 93%   General: NAD Cardiovascular: RRR, no M/R/G Respiratory: CTAB, normal work of breathing on room air, coughing with deep breathing Abdomen: Soft, nondistended, nontender to palpation Extremities: No pitting edema    Significant Procedures: Echo 1. Left ventricular ejection fraction, by estimation, is 60 to 65%. The  left ventricle has normal function. The left ventricle has no regional  wall motion abnormalities. There is moderate asymmetric left ventricular  hypertrophy of the basal-septal  segment. Indeterminate diastolic filling due to E-A fusion.   2. Right ventricular systolic function is normal. The right ventricular  size is normal.   3. The mitral valve is grossly normal. No evidence of mitral valve  regurgitation. No evidence of mitral stenosis.   4. The aortic valve is calcified. There is moderate calcification of the  aortic valve. Aortic valve regurgitation is not visualized. Aortic valve  leaflets appear to have mildly restricted motion, but aortic valve  gradient not well captured.   5. The inferior vena cava is normal in size with greater than 50%  respiratory variability, suggesting right atrial pressure of 3 mmHg.   Significant Labs and Imaging:  Recent Labs  Lab 06/02/24 0230  WBC 7.3  HGB 11.4*  HCT 35.9*  PLT 185   Recent Labs  Lab 06/02/24 0230  NA 138  K 3.9  CL 105  CO2 23  GLUCOSE 166*  BUN 17  CREATININE 0.70  CALCIUM  9.4    Pertinent Imaging    Chest x-ray Trachea is midline. Heart is at the upper limits of normal in size. Lungs are clear. 6 mm right middle lobe nodule seen on CT chest 04/16/2024 is poorly visualized. No pleural fluid.  CTA PE 1. No CT findings for pulmonary embolism. 2. Stable tortuosity, ectasia and calcification of the thoracic aorta but no  dissection or focal aneurysm. 3. Interstitial thickening and diffuse but asymmetric areas of ground-glass opacity most likely reflecting pulmonary edema. Other possibilities would include atypical/viral pneumonia, reactive airways disease or hypersensitivity pneumonitis. 4. Stable 6 mm right middle lobe pulmonary nodule and 3 mm left upper lobe pulmonary nodule. 5. Aortic atherosclerosis.  Discharge Medications:  Allergies as of 06/02/2024       Reactions   Amoxicillin Rash   Azithromycin  Rash   Itching/rash   Butalbital Other (See Comments)   Reaction: trouble breathing, but uncertain   Clindamycin  Hcl Rash   Januvia  [sitagliptin ] Other (See Comments)   Worsened migraines.   Levaquin  [levofloxacin  In D5w] Diarrhea   Meperidine Hcl Other (See Comments)   REACTION: itching and hallucinations   Sulfamethoxazole Rash   Fexofenadine  Hcl Other (See Comments)   Lips pealing    Ketorolac  Swelling   Received ketorolac , methocarbamal, and lidocaine  patch all at the same time.  Lip swelling 12 hours later   Macrodantin  [nitrofurantoin  Macrocrystal] Itching   Methocarbamol  Swelling   Received ketorolac , methocarbamal, and lidocaine  patch all at the same time.  Lip swelling 12 hours  later   Pioglitazone  Other (See Comments)   Severe back pain and diffuse itching   Tramadol     Unknown reaction   Codeine Phosphate Other (See Comments)   REACTION: unknown   Etodolac Other (See Comments)   REACTION: stomach upset   Gabapentin  Other (See Comments)   REACTION: mental clouding  Does not want to try again even in low dose.   Naproxen Other (See Comments)   REACTION: stomach upset   Pentazocine-naloxone  Hcl Other (See Comments)   REACTION: unspecified   Sumatriptan Other (See Comments)   REACTION: chest pain   Trimethoprim  Palpitations   SOB, tongue swelling   Zonegran Other (See Comments)   Reaction: Unknown        Medication List     STOP taking these medications     lidocaine  5 % ointment Commonly known as: XYLOCAINE  Replaced by: lidocaine  4 % You also have another medication with the same name that you need to continue taking as instructed.       TAKE these medications    benzonatate  100 MG capsule Commonly known as: TESSALON  Take 1 capsule (100 mg total) by mouth 2 (two) times daily as needed for cough.   busPIRone  10 MG tablet Commonly known as: BUSPAR  Take 1 tablet (10 mg total) by mouth 3 (three) times daily.   cefUROXime 500 MG tablet Commonly known as: CEFTIN Take 1 tablet (500 mg total) by mouth 2 (two) times daily with a meal for 3 doses.   clobetasol  ointment 0.05 % Commonly known as: TEMOVATE  Apply topically to vulva twice a week.   doxycycline  100 MG tablet Commonly known as: VIBRA -TABS Take 1 tablet (100 mg total) by mouth every 12 (twelve) hours for 3 doses.   DULoxetine  60 MG capsule Commonly known as: CYMBALTA  Take 1 capsule (60 mg total) by mouth daily.   enalapril  10 MG tablet Commonly known as: VASOTEC  Take 1 tablet (10 mg total) by mouth daily.   estradiol  0.1 MG/GM vaginal cream Commonly known as: ESTRACE  Place 0.5 g vaginally 2 (two) times a week.   famotidine  20 MG tablet Commonly known as: PEPCID  Take 1 tablet (20 mg total) by mouth 2 (two) times daily. What changed: when to take this   fluticasone -salmeterol 115-21 MCG/ACT inhaler Commonly known as: Advair HFA Inhale 2 puffs into the lungs 2 (two) times daily.   ICAPS AREDS 2 PO Take 1 tablet by mouth in the morning and at bedtime.   insulin  glargine 100 UNIT/ML Solostar Pen Commonly known as: LANTUS  Inject 6 Units into the skin every morning. Pen expires 28 days after first use What changed:  how much to take additional instructions   Insulin  Pen Needle 32G X 4 MM Misc Use with Lantus  pen   levocetirizine 5 MG tablet Commonly known as: XYZAL  Take 1 tablet (5 mg total) by mouth at bedtime. What changed: Another medication with the  same name was removed. Continue taking this medication, and follow the directions you see here.   lidocaine  5 % Commonly known as: LIDODERM  Apply 1 patch onto the skin every 12 (twelve) hours. What changed:  Another medication with the same name was added. Make sure you understand how and when to take each. Another medication with the same name was removed. Continue taking this medication, and follow the directions you see here.   lidocaine  4 % Place 2 patches onto the skin daily. Remove & Discard patch within 12 hours or as directed by MD What changed: You  were already taking a medication with the same name, and this prescription was added. Make sure you understand how and when to take each. Replaces: lidocaine  5 % ointment   multivitamin with minerals Tabs tablet Take 1 tablet by mouth daily.   ondansetron  4 MG disintegrating tablet Commonly known as: ZOFRAN -ODT Take 1 tablet (4 mg total) by mouth every 8 (eight) hours as needed for nausea or vomiting.   OneTouch Delica Plus Lancet33G Misc USE TO CHECK GLUCOSE IN THE MORNING AND AT BEDTIME. E11.9   OneTouch Verio test strip Generic drug: glucose blood USE 1 STRIP TO TEST 2 TIMES DAILY AS DIRECTED   oxyCODONE -acetaminophen  10-325 MG tablet Commonly known as: Percocet Take 1 tablet by mouth every 4 (four) hours as needed for pain.   pantoprazole  40 MG tablet Commonly known as: PROTONIX  Take 1 tablet (40 mg total) by mouth daily. What changed: when to take this   rosuvastatin  20 MG tablet Commonly known as: CRESTOR  Take 1 tablet (20 mg total) by mouth daily.   triamcinolone  ointment 0.5 % Commonly known as: KENALOG  Apply 1 application topically 2 (two) times daily. For elbows What changed:  when to take this reasons to take this additional instructions               Durable Medical Equipment  (From admission, onward)           Start     Ordered   06/02/24 1256  For home use only DME oxygen  Once        Question Answer Comment  Length of Need Lifetime   Mode or (Route) Nasal cannula   Liters per Minute 2   Frequency Continuous (stationary and portable oxygen unit needed)   Oxygen conserving device Yes   Oxygen delivery system: Gas      06/02/24 1255            Discharge Instructions: Please refer to Patient Instructions section of EMR for full details.  Patient was counseled important signs and symptoms that should prompt return to medical care, changes in medications, dietary instructions, activity restrictions, and follow up appointments.   Follow-Up Appointments:  Future Appointments  Date Time Provider Department Center  06/03/2024  9:10 AM Madelon Donald HERO, DO [facility]  06/23/2024  1:15 PM Gershon Donnice SAUNDERS, DPM [facility]  06/27/2024  2:20 PM Debby Fidela CROME, NP [facility]  07/11/2024  1:00 PM Raulkar, Sven SQUIBB, MD [facility]    Lennie Raguel MATSU, DO 06/02/2024, 1:16 PM PGY-1, [facility] Family Medicine  I have reviewed the discharge summary and recommendations, and agree with the resident's assessment and plan. I attest that the information is accurate and aligns with my independent assessment of this patient.   Lucie Pinal, DO PGY-2, Family Medicine

## 2024-06-02 NOTE — Discharge Instructions (Signed)
 Dear [name],   Thank you for letting us  participate in your care! In this section, you will find a brief hospital admission summary of why you were admitted to the hospital, what happened during your admission, your diagnosis/diagnoses, and recommended follow up. You were diagnosed with pneumonia which was treated with antibiotics. Please finish taking them. Also, based on a CT scan you had in September, you have emphysema or COPD. You are on an inhaler that will help with this. You can discuss this more with Dr. Rumball. Your oxygen level also drops a bit too low when you are walking, so we are sending you home with oxygen to use when you need it. Please keep taking your insulin .    POST-HOSPITAL & CARE INSTRUCTIONS Please follow-up with your PCP as seen below.  Please let PCP/Specialists know of any changes in medications that were made which you will be able to see in the medications section of this packet. If you begin feeling more short of breath again, start having fevers/chills, coughing up mucus, or having more chest pain, please seek medical care.   DOCTOR'S APPOINTMENTS & FOLLOW UP Future Appointments  Date Time Provider Department Center  06/03/2024  9:10 AM Madelon Donald HERO, DO [facility]  06/23/2024  1:15 PM Gershon Donnice SAUNDERS, DPM [facility]  06/27/2024  2:20 PM Debby Fidela CROME, NP [facility]  07/11/2024  1:00 PM Raulkar, Sven SQUIBB, MD [facility]     Thank you for choosing [facility]! Take care and be well!  Family Medicine Teaching Service Inpatient Team [place]  [facility]  [address] [place], [place] [address] [phone]

## 2024-06-02 NOTE — Progress Notes (Unsigned)
   SUBJECTIVE:   CHIEF COMPLAINT / HPI:   Discussed the use of AI scribe software for clinical note transcription with the patient, who gave verbal consent to proceed.  History of Present Illness   HOSPITAL FOLLOW UP Hospital/facility: [facility] Diagnosis: AHRF ?secondary to emphysema vs CAP Procedures/tests:  - CTA negative for PE - ECHO LVEF 60-65%, moderate asymmetric LVH Consultants:  New medications:  - prednisone  - breo - duonebs prn - restarted lantus  at 8u Discharge instructions:   Status: {Blank multiple:19196::better,worse,stable,fluctuating}  Sugars? Needs PFT  OBJECTIVE:   There were no vitals taken for this visit.  Gen: well appearing, in NAD Card: RRR Lungs: CTAB Ext: WWP, no edema ***  ASSESSMENT/PLAN:   No problem-specific Assessment & Plan notes found for this encounter.     Assessment and Plan Assessment & Plan         Donald CHRISTELLA Lai, DO

## 2024-06-02 NOTE — Care Management Important Message (Signed)
 Important Message  Patient Details  Name: [name] MRN: [id number] Date of Birth: March 12, 1941   Important Message Given:  Yes - Medicare IM     Jon Cruel 06/02/2024, 3:21 PM

## 2024-06-03 ENCOUNTER — Ambulatory Visit (INDEPENDENT_AMBULATORY_CARE_PROVIDER_SITE_OTHER): Admitting: Family Medicine

## 2024-06-03 ENCOUNTER — Telehealth: Payer: Self-pay

## 2024-06-03 ENCOUNTER — Encounter: Payer: Self-pay | Admitting: Family Medicine

## 2024-06-03 VITALS — BP 164/82 | HR 100 | Wt 175.4 lb

## 2024-06-03 DIAGNOSIS — J45901 Unspecified asthma with (acute) exacerbation: Secondary | ICD-10-CM | POA: Diagnosis not present

## 2024-06-03 DIAGNOSIS — E118 Type 2 diabetes mellitus with unspecified complications: Secondary | ICD-10-CM | POA: Diagnosis not present

## 2024-06-03 DIAGNOSIS — J189 Pneumonia, unspecified organism: Secondary | ICD-10-CM | POA: Diagnosis not present

## 2024-06-03 DIAGNOSIS — I10 Essential (primary) hypertension: Secondary | ICD-10-CM

## 2024-06-03 DIAGNOSIS — J9601 Acute respiratory failure with hypoxia: Secondary | ICD-10-CM | POA: Diagnosis not present

## 2024-06-03 DIAGNOSIS — G894 Chronic pain syndrome: Secondary | ICD-10-CM | POA: Diagnosis not present

## 2024-06-03 DIAGNOSIS — R079 Chest pain, unspecified: Secondary | ICD-10-CM

## 2024-06-03 DIAGNOSIS — M159 Polyosteoarthritis, unspecified: Secondary | ICD-10-CM

## 2024-06-03 DIAGNOSIS — R110 Nausea: Secondary | ICD-10-CM | POA: Diagnosis not present

## 2024-06-03 LAB — GLUCOSE, POCT (MANUAL RESULT ENTRY): POC Glucose: 242 mg/dL — AB (ref 70–99)

## 2024-06-03 MED ORDER — ONDANSETRON 4 MG PO TBDP
4.0000 mg | ORAL_TABLET | Freq: Once | ORAL | Status: AC
  Administered 2024-06-03: 4 mg via ORAL

## 2024-06-03 NOTE — Assessment & Plan Note (Addendum)
 Resolved

## 2024-06-03 NOTE — Assessment & Plan Note (Signed)
 No focal findings on exam, finishes antiobitics today.

## 2024-06-03 NOTE — Assessment & Plan Note (Signed)
 HHPT due to difficulties ambulating and trouble with transportation

## 2024-06-03 NOTE — Assessment & Plan Note (Signed)
HHPT °

## 2024-06-03 NOTE — Assessment & Plan Note (Addendum)
 Elevated this morning but didn't take meds, historically well controlled. No changes today. F/u 1 month.

## 2024-06-03 NOTE — Patient Instructions (Addendum)
 It was great to see you!  Our plans for today:  - Continue to take your lantus  6u every day. We'll check in with you early next week to follow up on your sugars. Continue to keep a log of these.  - Finish your antibiotics. - Come back in 1 month.   Take care and seek immediate care sooner if you develop any concerns.   Dr. Nneoma Harral

## 2024-06-03 NOTE — Assessment & Plan Note (Addendum)
 Resolved. Maintained O2 sats, lowest SpO2 94% with ambulation. Discontinue supplemental O2.

## 2024-06-03 NOTE — Assessment & Plan Note (Addendum)
 Uncontrolled today, CBG 242 in office though didn't take lantus  and with recent steroid use. Continue lantus  through the weekend, will have pharmacy reach out early next week to check in. F/u 1 month with PCP

## 2024-06-03 NOTE — Assessment & Plan Note (Addendum)
 Unclear true history. Emphysema seen on imaging. Plan for PFTs on follow up once ?PNA cleared.

## 2024-06-03 NOTE — Telephone Encounter (Signed)
 Patient calls nurse line regarding continued nausea and vomiting.   She received Zofran  by mouth in clinic today, once she returned home, vomiting started again. She took Zofran  around 1 pm and had episode of emesis approx 1 hour after.   She also reports sharp generalized abdominal pain.This pain started today. She feels that this is related to pulling on her stomach with vomiting.   Denies fever or chills. Denies chest pain.   Advised that with current symptoms, I would recommend her receiving evaluation today. She reports that she does not have transportation and is asking if there is anything else that we could prescribe for her.   Spoke with Dr. Rosalynn and Dr. Donah regarding patient. They agreed with ED eval.   Called patient back. Patient reports that she has drank several sips of ginger ale and abdominal pain is improving and is calming her stomach. She states that she does not want to go to ED at this time and will monitor her symptoms from home. If symptoms worsen, she will then go to ED for evaluation.   Reiterated ED precautions.   Patient voices understanding.   Chiquita JAYSON English, RN

## 2024-06-06 ENCOUNTER — Telehealth: Payer: Self-pay

## 2024-06-06 ENCOUNTER — Telehealth: Payer: Self-pay | Admitting: Pharmacist

## 2024-06-06 NOTE — Telephone Encounter (Signed)
-----   Message from Donald CHRISTELLA Lai sent at 06/03/2024 10:11 AM EDT ----- Walterine!  Would you mind checking in with Ms. Taquita sometime early next week to see how her sugars are?  She was recently hospitalized for ?COPD exacerbation vs PNA and put on steroids. She finished her course in the hospital but her POC CBG at our visit was 242 so I had her continue lantus  at 6u through the weekend and we'll see how her sugars are next week.   Let me know if you have questions or if you are slammed next week and can't reach out also totally ok!  :) Donald

## 2024-06-06 NOTE — Telephone Encounter (Signed)
 Patient calls nurse line requesting a medication for a yeast infection.   She reports she has been on antibiotics for ~ 7 days.   She reports she started having discharge and vaginal burning on Thursday evening.   She denies any odors or blood. She reports she just feels very very raw' and itchy.   She is requesting the insert vaginal medication and a cream for the outside.   She is requesting these medications go to [facility] outpatient, as they deliver.   Advised will forward to PCP.

## 2024-06-06 NOTE — Telephone Encounter (Signed)
 Reviewed and agree with Dr Rennis plan.

## 2024-06-06 NOTE — Telephone Encounter (Signed)
 Patient contacted for follow-up of glucose readings during steroid use.   Since last contact patient reports Fast Glucose values of 152, 156 and 159  Current Medications include: Lantus  (insulin  glargine) 7 units once daily Patient denies any significant medication related side effects.  Medication Plan: - Continue Lantus  (insulin  glargine) at 7 units daily while using steroid medication.  Instructed to contact our office if glucose readings were < 100 or if they were concerning HIGH to her.   Total time with patient call and documentation of interaction: 11 minutes.  F/U Phone call planned: None at this time.

## 2024-06-07 ENCOUNTER — Other Ambulatory Visit (HOSPITAL_COMMUNITY): Payer: Self-pay

## 2024-06-07 ENCOUNTER — Other Ambulatory Visit: Payer: Self-pay

## 2024-06-07 MED ORDER — FLUCONAZOLE 150 MG PO TABS
150.0000 mg | ORAL_TABLET | Freq: Once | ORAL | 0 refills | Status: AC
  Filled 2024-06-07: qty 1, 1d supply, fill #0

## 2024-06-07 MED ORDER — CLOTRIMAZOLE 1 % EX CREA
1.0000 | TOPICAL_CREAM | Freq: Two times a day (BID) | CUTANEOUS | 0 refills | Status: AC
  Filled 2024-06-07: qty 30, 15d supply, fill #0

## 2024-06-07 NOTE — Telephone Encounter (Signed)
 Orders sent. If she continues to have trouble after treatment, she should come for in person evaluation.

## 2024-06-08 ENCOUNTER — Other Ambulatory Visit (HOSPITAL_COMMUNITY): Payer: Self-pay

## 2024-06-08 NOTE — Telephone Encounter (Signed)
 Patient has been updated.   She reports they are mailing her prescriptions today.

## 2024-06-09 NOTE — Telephone Encounter (Signed)
 Patient calls nurse line regarding treatment. She took diflucan  tablet yesterday, however, she is not feeling any better today.   She has not yet started taking the cream as she wanted to verify that this is the correct medication. Advised patient that she could apply the cream to outer areas that were irritated and oral pill should help with discharge and internal irritation.   Patient is concerned that 1 diflucan  is not going to be enough to treat this infection.   She is asking if she can have second tablet to take in 2 days. She is asking for medication to go ahead and be sent to the pharmacy as she gets prescriptions shipped to her.  Please advise.   Debra JAYSON English, RN

## 2024-06-10 NOTE — Telephone Encounter (Signed)
 Called patient and advised of update per PCP.   Patient cannot come in today and is requesting appt on Monday with female provider. Scheduled with Dr. Donah on Monday morning.   She will call back to office if she is unable to obtain transportation.   Chiquita JAYSON English, RN

## 2024-06-13 ENCOUNTER — Other Ambulatory Visit: Payer: Self-pay

## 2024-06-13 ENCOUNTER — Ambulatory Visit (INDEPENDENT_AMBULATORY_CARE_PROVIDER_SITE_OTHER): Admitting: Family Medicine

## 2024-06-13 ENCOUNTER — Encounter: Payer: Self-pay | Admitting: Family Medicine

## 2024-06-13 ENCOUNTER — Encounter: Payer: Self-pay | Admitting: Radiology

## 2024-06-13 ENCOUNTER — Other Ambulatory Visit (HOSPITAL_COMMUNITY): Payer: Self-pay

## 2024-06-13 VITALS — BP 127/69 | HR 92 | Ht 60.0 in | Wt 181.4 lb

## 2024-06-13 DIAGNOSIS — K219 Gastro-esophageal reflux disease without esophagitis: Secondary | ICD-10-CM | POA: Diagnosis not present

## 2024-06-13 DIAGNOSIS — N9089 Other specified noninflammatory disorders of vulva and perineum: Secondary | ICD-10-CM

## 2024-06-13 DIAGNOSIS — F419 Anxiety disorder, unspecified: Secondary | ICD-10-CM | POA: Diagnosis not present

## 2024-06-13 DIAGNOSIS — I10 Essential (primary) hypertension: Secondary | ICD-10-CM | POA: Diagnosis not present

## 2024-06-13 DIAGNOSIS — R059 Cough, unspecified: Secondary | ICD-10-CM

## 2024-06-13 DIAGNOSIS — Z23 Encounter for immunization: Secondary | ICD-10-CM

## 2024-06-13 DIAGNOSIS — Z794 Long term (current) use of insulin: Secondary | ICD-10-CM | POA: Diagnosis not present

## 2024-06-13 DIAGNOSIS — M159 Polyosteoarthritis, unspecified: Secondary | ICD-10-CM | POA: Diagnosis not present

## 2024-06-13 DIAGNOSIS — Z604 Social exclusion and rejection: Secondary | ICD-10-CM | POA: Diagnosis not present

## 2024-06-13 DIAGNOSIS — E785 Hyperlipidemia, unspecified: Secondary | ICD-10-CM | POA: Diagnosis not present

## 2024-06-13 DIAGNOSIS — G894 Chronic pain syndrome: Secondary | ICD-10-CM | POA: Diagnosis not present

## 2024-06-13 DIAGNOSIS — J439 Emphysema, unspecified: Secondary | ICD-10-CM | POA: Diagnosis not present

## 2024-06-13 DIAGNOSIS — Z602 Problems related to living alone: Secondary | ICD-10-CM | POA: Diagnosis not present

## 2024-06-13 DIAGNOSIS — E119 Type 2 diabetes mellitus without complications: Secondary | ICD-10-CM | POA: Diagnosis not present

## 2024-06-13 DIAGNOSIS — I700 Atherosclerosis of aorta: Secondary | ICD-10-CM | POA: Diagnosis not present

## 2024-06-13 DIAGNOSIS — J189 Pneumonia, unspecified organism: Secondary | ICD-10-CM | POA: Diagnosis not present

## 2024-06-13 DIAGNOSIS — J45901 Unspecified asthma with (acute) exacerbation: Secondary | ICD-10-CM | POA: Diagnosis not present

## 2024-06-13 DIAGNOSIS — F32A Depression, unspecified: Secondary | ICD-10-CM | POA: Diagnosis not present

## 2024-06-13 MED ORDER — TERCONAZOLE 0.4 % VA CREA
1.0000 | TOPICAL_CREAM | Freq: Every day | VAGINAL | 0 refills | Status: AC
  Filled 2024-06-13: qty 45, 7d supply, fill #0

## 2024-06-13 MED ORDER — DESITIN MAXIMUM STRENGTH 40 % EX PSTE
PASTE | CUTANEOUS | 1 refills | Status: AC
  Filled 2024-06-13: qty 113, fill #0

## 2024-06-13 MED ORDER — FLUCONAZOLE 150 MG PO TABS
150.0000 mg | ORAL_TABLET | Freq: Once | ORAL | 0 refills | Status: AC
  Filled 2024-06-13: qty 2, 2d supply, fill #0

## 2024-06-13 NOTE — Patient Instructions (Signed)
 It was great to see you again today.  Sent in diflucan , and terconazole  vaginal cream you can insert vaginally and apply externally before bed  Also recommend you get zinc oxide based diaper cream for use on the outside. I like Boudreaux's butt paste but I am checking with the [facility] to see if they have some they can send with you  If not improving please contact us  again  Be well, Dr. Donah

## 2024-06-13 NOTE — Progress Notes (Unsigned)
  Date of Visit: 06/13/2024   SUBJECTIVE:   HPI:  Discussed the use of AI scribe software for clinical note transcription with the patient, who gave verbal consent to proceed.  History of Present Illness [name] is an 83 year old female who presents with vaginal irritation and a persistent cough.  Vaginal and perineal irritation - Vaginal irritation characterized by itching and burning, described as 'almost splitting' and very irritated - Irritation extends to the perineal area near the rectum - No vaginal discharge - No history of sexually transmitted infections, herpes, or similar conditions - No new sexual partners - History of frequent yeast infections - Recent course of antibiotics during hospitalization - Partial relief with prior Diflucan  treatment, but symptoms persist - Uses clobetasol  ointment, Lotrimin  cream, and estradiol  vaginal cream twice a week  Urinary incontinence - Incontinent of urine - Uses a pad regularly, which is often wet  Persistent cough - Persistent 'hacking' cough - Cough returned after mild initial presentation - Cough has persisted since recent hospitalization for pneumonia - Uses Tessalon  as needed and Advair two puffs twice a day      OBJECTIVE:   BP 127/69   Pulse 92   Ht 5' (1.524 m)   Wt 181 lb 6.4 oz (82.3 kg)   SpO2 96%   BMI 35.43 kg/m  Gen: *** HEENT: *** Heart: *** Lungs: *** Neuro: *** Ext: ***  ASSESSMENT/PLAN:   Assessment & Plan      Assessment and Plan Assessment & Plan Vulvovaginal candidiasis with perineal irritation Persistent candidiasis with significant itching and burning, likely exacerbated by recent antibiotic use. No history of STIs or herpes. - Prescribe two additional doses of Diflucan , one today and another in three days. - Prescribe terconazole  cream with a vaginal applicator for internal use. - Advise use of clotrimazole  cream externally twice a day.  Diaper dermatitis due to  incontinence Severe irritation around vulva and perineal area due to moisture from urinary incontinence, consistent with diaper dermatitis. - Recommend barrier cream with zinc oxide, such as Boudreaux's Butt Paste, to protect skin from moisture. - Instruct to contact pharmacy to include barrier cream in delivery.  Urinary incontinence Chronic urinary incontinence contributing to skin irritation and diaper dermatitis.  Cough following pneumonia Persistent cough post-pneumonia, lungs clear, likely part of recovery process.  General Health Maintenance She requested flu vaccination. - Administer flu shot.      FOLLOW UP: Follow up in *** for ***  Debra Kamau J. Donah, MD [facility]

## 2024-06-14 ENCOUNTER — Other Ambulatory Visit: Payer: Self-pay

## 2024-06-15 ENCOUNTER — Telehealth: Payer: Self-pay

## 2024-06-15 ENCOUNTER — Other Ambulatory Visit (HOSPITAL_COMMUNITY): Payer: Self-pay

## 2024-06-15 NOTE — Telephone Encounter (Signed)
 Patient calls nurse line reporting all over itching.  She reports she was here on Monday and reports getting her flu vaccine.   She reports the only thing she can contribute to the itchiness is receiving the flu vaccine.   She denies any new medications, new foods, lotions or detergents.  She reports she itches all over and reports poor sleep. She denies any rashes, fevers or chills. No shortness of breath or difficulty swallowing.   She reports she takes Xyzal  daily.   She is requesting PCP opinion on possible allergic reaction to flu vaccine.   Will forward to PCP.

## 2024-06-15 NOTE — Telephone Encounter (Signed)
 This is unlikely related to flu vaccine. Please schedule for a visit to be seen.  Suzann Daring, MD  Family Medicine Teaching Service

## 2024-06-16 NOTE — Telephone Encounter (Signed)
 Contacted patient.  She reports her symptoms have improved and she was able to sleep last night.   She declines to make an apt at this time.   Patient advised to call the office for an apt if symptoms worsen or fail to improve.

## 2024-06-22 ENCOUNTER — Other Ambulatory Visit: Payer: Self-pay

## 2024-06-23 ENCOUNTER — Ambulatory Visit: Admitting: Podiatry

## 2024-06-24 ENCOUNTER — Telehealth: Payer: Self-pay

## 2024-06-24 ENCOUNTER — Other Ambulatory Visit (HOSPITAL_COMMUNITY): Payer: Self-pay

## 2024-06-24 MED ORDER — LEVOCETIRIZINE DIHYDROCHLORIDE 5 MG PO TABS
5.0000 mg | ORAL_TABLET | Freq: Two times a day (BID) | ORAL | 2 refills | Status: AC
  Filled 2024-07-11: qty 60, 30d supply, fill #0
  Filled 2024-08-06: qty 60, 30d supply, fill #1

## 2024-06-24 NOTE — Telephone Encounter (Signed)
 [place]an OT with [facility] calls nurse line requesting VO orders for [facility] OT as follows.   1x a week for 2 weeks  Skip week 1x a week for 4 weeks  Verbal orders given per [facility] protocol.

## 2024-06-27 ENCOUNTER — Encounter: Attending: Physical Medicine and Rehabilitation | Admitting: Registered Nurse

## 2024-06-27 DIAGNOSIS — R0902 Hypoxemia: Secondary | ICD-10-CM | POA: Insufficient documentation

## 2024-06-27 DIAGNOSIS — M25562 Pain in left knee: Secondary | ICD-10-CM | POA: Insufficient documentation

## 2024-06-27 DIAGNOSIS — Z79891 Long term (current) use of opiate analgesic: Secondary | ICD-10-CM | POA: Insufficient documentation

## 2024-06-27 DIAGNOSIS — M545 Low back pain, unspecified: Secondary | ICD-10-CM | POA: Insufficient documentation

## 2024-06-27 DIAGNOSIS — G8929 Other chronic pain: Secondary | ICD-10-CM | POA: Insufficient documentation

## 2024-06-27 DIAGNOSIS — R000 Tachycardia, unspecified: Secondary | ICD-10-CM | POA: Insufficient documentation

## 2024-06-27 DIAGNOSIS — M25511 Pain in right shoulder: Secondary | ICD-10-CM | POA: Insufficient documentation

## 2024-06-27 DIAGNOSIS — Z5181 Encounter for therapeutic drug level monitoring: Secondary | ICD-10-CM | POA: Insufficient documentation

## 2024-06-27 DIAGNOSIS — G894 Chronic pain syndrome: Secondary | ICD-10-CM | POA: Insufficient documentation

## 2024-06-27 DIAGNOSIS — M546 Pain in thoracic spine: Secondary | ICD-10-CM | POA: Insufficient documentation

## 2024-07-01 ENCOUNTER — Telehealth: Payer: Self-pay

## 2024-07-01 NOTE — Telephone Encounter (Signed)
 Received call from [place] [place] Grant with [facility] regarding patient. She reports that patient will have a missed PT visit next week, per patient request due to holiday.   FYI.   Chiquita JAYSON English, RN

## 2024-07-11 ENCOUNTER — Encounter: Attending: Physical Medicine and Rehabilitation | Admitting: Physical Medicine and Rehabilitation

## 2024-07-11 ENCOUNTER — Other Ambulatory Visit: Payer: Self-pay | Admitting: Family Medicine

## 2024-07-11 ENCOUNTER — Other Ambulatory Visit: Payer: Self-pay

## 2024-07-11 ENCOUNTER — Other Ambulatory Visit (HOSPITAL_COMMUNITY): Payer: Self-pay

## 2024-07-11 DIAGNOSIS — M25511 Pain in right shoulder: Secondary | ICD-10-CM | POA: Insufficient documentation

## 2024-07-11 DIAGNOSIS — I10 Essential (primary) hypertension: Secondary | ICD-10-CM

## 2024-07-11 DIAGNOSIS — Z79891 Long term (current) use of opiate analgesic: Secondary | ICD-10-CM | POA: Insufficient documentation

## 2024-07-11 DIAGNOSIS — M25562 Pain in left knee: Secondary | ICD-10-CM | POA: Insufficient documentation

## 2024-07-11 DIAGNOSIS — G609 Hereditary and idiopathic neuropathy, unspecified: Secondary | ICD-10-CM

## 2024-07-11 DIAGNOSIS — M545 Low back pain, unspecified: Secondary | ICD-10-CM | POA: Insufficient documentation

## 2024-07-11 DIAGNOSIS — Z5181 Encounter for therapeutic drug level monitoring: Secondary | ICD-10-CM | POA: Insufficient documentation

## 2024-07-11 DIAGNOSIS — G8929 Other chronic pain: Secondary | ICD-10-CM | POA: Insufficient documentation

## 2024-07-11 DIAGNOSIS — G894 Chronic pain syndrome: Secondary | ICD-10-CM | POA: Insufficient documentation

## 2024-07-11 MED ORDER — FAMOTIDINE 20 MG PO TABS
20.0000 mg | ORAL_TABLET | Freq: Two times a day (BID) | ORAL | 1 refills | Status: AC
  Filled 2024-07-11: qty 60, 30d supply, fill #0
  Filled 2024-08-06: qty 60, 30d supply, fill #1

## 2024-07-11 MED ORDER — ONDANSETRON 4 MG PO TBDP
4.0000 mg | ORAL_TABLET | Freq: Three times a day (TID) | ORAL | 0 refills | Status: AC | PRN
  Filled 2024-07-11: qty 12, 4d supply, fill #0

## 2024-07-11 MED ORDER — LEVOCETIRIZINE DIHYDROCHLORIDE 5 MG PO TABS
5.0000 mg | ORAL_TABLET | Freq: Two times a day (BID) | ORAL | 2 refills | Status: AC
  Filled 2024-09-15: qty 60, 30d supply, fill #0

## 2024-07-11 MED ORDER — ENALAPRIL MALEATE 10 MG PO TABS
10.0000 mg | ORAL_TABLET | Freq: Every day | ORAL | 3 refills | Status: AC
  Filled 2024-07-11: qty 90, 90d supply, fill #0

## 2024-07-12 ENCOUNTER — Other Ambulatory Visit (HOSPITAL_COMMUNITY): Payer: Self-pay

## 2024-07-12 ENCOUNTER — Other Ambulatory Visit: Payer: Self-pay

## 2024-07-12 MED ORDER — DULOXETINE HCL 60 MG PO CPEP
60.0000 mg | ORAL_CAPSULE | Freq: Every day | ORAL | 3 refills | Status: AC
  Filled 2024-07-12: qty 90, 90d supply, fill #0

## 2024-07-18 ENCOUNTER — Ambulatory Visit: Admitting: Physician Assistant

## 2024-07-18 ENCOUNTER — Encounter: Payer: Self-pay | Admitting: Physician Assistant

## 2024-07-18 DIAGNOSIS — M1712 Unilateral primary osteoarthritis, left knee: Secondary | ICD-10-CM | POA: Diagnosis not present

## 2024-07-18 NOTE — Progress Notes (Signed)
 HPI: [name] comes in today due to left knee pain.  States the knee gives way on her she ambulates with a walker when inside the home and outside home.  She was last seen 02/15/2024 and at that time was given a cortisone injection in her knee and knee was aspirated.  She reports that with cortisone injections her sugars are quite elevated into the 400s and she does not want cortisone but would like an aspiration of the knee today.  She also was asking about viscosupplementation injections.  Left knee known tricompartmental arthritis with severe patellofemoral arthritic changes and narrowing medial joint line which is severe also.  She does not wish to have any surgery on the knee at this point in time.  She continues to deal with her back pain and sees Dr. Colon for this.  Review of systems see HPI otherwise negative  Left knee: No abnormal warmth erythema.  Positive effusion.  Global tenderness.  She has full extension and flexion well beyond 90 degrees.    Impression: Left knee tricompartmental arthritis  Plan: Left knee is aspirated today total of 25 cc of serosanguineous fluid obtained.  Knee is wrapped with an Ace bandage which she will remove this evening before retiring to bed.  She will follow-up with us  in the near future pending approval for viscosupplementation injection left knee.

## 2024-07-19 ENCOUNTER — Other Ambulatory Visit: Payer: Self-pay | Admitting: Radiology

## 2024-07-19 DIAGNOSIS — M1712 Unilateral primary osteoarthritis, left knee: Secondary | ICD-10-CM

## 2024-08-02 ENCOUNTER — Encounter: Payer: Self-pay | Admitting: Registered Nurse

## 2024-08-02 ENCOUNTER — Other Ambulatory Visit (HOSPITAL_COMMUNITY): Payer: Self-pay

## 2024-08-02 ENCOUNTER — Other Ambulatory Visit: Payer: Self-pay

## 2024-08-02 ENCOUNTER — Encounter: Admitting: Registered Nurse

## 2024-08-02 VITALS — BP 128/74 | HR 85 | Ht 60.0 in | Wt 181.0 lb

## 2024-08-02 DIAGNOSIS — Z79891 Long term (current) use of opiate analgesic: Secondary | ICD-10-CM | POA: Diagnosis present

## 2024-08-02 DIAGNOSIS — M25511 Pain in right shoulder: Secondary | ICD-10-CM | POA: Diagnosis present

## 2024-08-02 DIAGNOSIS — G8929 Other chronic pain: Secondary | ICD-10-CM

## 2024-08-02 DIAGNOSIS — M25562 Pain in left knee: Secondary | ICD-10-CM

## 2024-08-02 DIAGNOSIS — M545 Low back pain, unspecified: Secondary | ICD-10-CM | POA: Diagnosis present

## 2024-08-02 DIAGNOSIS — G894 Chronic pain syndrome: Secondary | ICD-10-CM | POA: Diagnosis present

## 2024-08-02 DIAGNOSIS — Z5181 Encounter for therapeutic drug level monitoring: Secondary | ICD-10-CM

## 2024-08-02 MED ORDER — OXYCODONE-ACETAMINOPHEN 10-325 MG PO TABS
1.0000 | ORAL_TABLET | Freq: Four times a day (QID) | ORAL | 0 refills | Status: AC | PRN
  Filled 2024-08-02: qty 120, 30d supply, fill #0

## 2024-08-02 MED ORDER — LIDOCAINE 5 % EX PTCH
1.0000 | MEDICATED_PATCH | Freq: Two times a day (BID) | CUTANEOUS | 2 refills | Status: AC
  Filled 2024-08-02: qty 30, 15d supply, fill #0

## 2024-08-02 NOTE — Progress Notes (Signed)
 "  Subjective:    Patient ID: [name], female    DOB: [birth year], 83 y.o.   MRN: [id number]  HPI: [name] is a 83 y.o. female who returns for follow up appointment for chronic pain and medication refill. She states her pain is located in her right shoulder, lower back and left knee pain. She rates her pain 10.Her current exercise regime is receiving physical therapy and occupational therapy walking and performing stretching exercises.  Ms. Cuoco Morphine  equivalent is 90.00 MME.  Last Oral Swab was Performed on 05/23/2024, it was consistent.     Pain Inventory Average Pain 10 Pain Right Now 10 My pain is sharp, stabbing, and tingling  In the last 24 hours, has pain interfered with the following? General activity 2 Relation with others 2 Enjoyment of life 8 What TIME of day is your pain at its worst? varies Sleep (in general) Fair  Pain is worse with: walking, bending, sitting, and standing Pain improves with: medication Relief from Meds: 3  Family History  Problem Relation Age of Onset   Hypertension Mother    Hypertension Father    Hearing loss Sister    Heart attack Sister 67       s/p 4 bypasses    Stroke Brother        35   Breast cancer Maternal Aunt    Ovarian cancer Maternal Aunt    Social History   Socioeconomic History   Marital status: Widowed    Spouse name: Not on file   Number of children: 2   Years of education: Not on file   Highest education level: Not on file  Occupational History   Occupation: Retired  Tobacco Use   Smoking status: Former    Current packs/day: 0.00    Average packs/day: 2.0 packs/day for 33.6 years (67.3 ttl pk-yrs)    Types: Cigarettes    Start date: 08/11/1957    Quit date: 04/04/1991    Years since quitting: 33.3   Smokeless tobacco: Never  Vaping Use   Vaping status: Never Used  Substance and Sexual Activity   Alcohol use: No   Drug use: No   Sexual activity: Not Currently  Other Topics Concern   Not on  file  Social History Narrative   Not on file   Social Drivers of Health   Tobacco Use: Medium Risk (07/18/2024)   Patient History    Smoking Tobacco Use: Former    Smokeless Tobacco Use: Never    Passive Exposure: Not on Actuary Strain: Low Risk (10/29/2023)   Overall Financial Resource Strain (CARDIA)    Difficulty of Paying Living Expenses: Not hard at all  Food Insecurity: No Food Insecurity (05/31/2024)   Epic    Worried About Radiation Protection Practitioner of Food in the Last Year: Never true    Ran Out of Food in the Last Year: Never true  Transportation Needs: No Transportation Needs (05/31/2024)   Epic    Lack of Transportation (Medical): No    Lack of Transportation (Non-Medical): No  Physical Activity: Inactive (10/29/2023)   Exercise Vital Sign    Days of Exercise per Week: 0 days    Minutes of Exercise per Session: 0 min  Stress: No Stress Concern Present (10/29/2023)   [employer] of Occupational Health - Occupational Stress Questionnaire    Feeling of Stress : Not at all  Social Connections: Moderately Integrated (05/31/2024)   Social Connection and Isolation Panel    Frequency  of Communication with Friends and Family: More than three times a week    Frequency of Social Gatherings with Friends and Family: More than three times a week    Attends Religious Services: More than 4 times per year    Active Member of [employer] or Organizations: Yes    Attends Banker Meetings: More than 4 times per year    Marital Status: Widowed  Depression (PHQ2-9): Low Risk (06/13/2024)   Depression (PHQ2-9)    PHQ-2 Score: 0  Alcohol Screen: Low Risk (10/29/2023)   Alcohol Screen    Last Alcohol Screening Score (AUDIT): 0  Housing: Low Risk (05/31/2024)   Epic    Unable to Pay for Housing in the Last Year: No    Number of Times Moved in the Last Year: 0    Homeless in the Last Year: No  Utilities: Not At Risk (05/31/2024)   Epic    Threatened with loss of utilities:  No  Health Literacy: Adequate Health Literacy (10/29/2023)   B1300 Health Literacy    Frequency of need for help with medical instructions: Never   Past Surgical History:  Procedure Laterality Date   ABDOMINAL HYSTERECTOMY     ?Lt. ovary remains   APPENDECTOMY     Arm surgery     BREAST BIOPSY Right 10/10/2009   BREAST BIOPSY Right 09/17/2022   MM RT BREAST BX W LOC DEV 1ST LESION IMAGE BX SPEC STEREO GUIDE 09/17/2022 [facility]   BREAST CYST ASPIRATION  12/20/2013   BREAST EXCISIONAL BIOPSY Left 1998   BREAST EXCISIONAL BIOPSY Right 1993   ESOPHAGOGASTRODUODENOSCOPY N/A 04/17/2021   Procedure: ESOPHAGOGASTRODUODENOSCOPY (EGD);  Surgeon: Kristie Lamprey, MD;  Location: [facility] ENDOSCOPY;  Service: Endoscopy;  Laterality: N/A;  Rectal bleeding and anemia.   ESOPHAGOGASTRODUODENOSCOPY N/A 04/18/2021   Procedure: ESOPHAGOGASTRODUODENOSCOPY (EGD);  Surgeon: Rollin Dover, MD;  Location: [facility] ENDOSCOPY;  Service: Endoscopy;  Laterality: N/A;   HOT HEMOSTASIS N/A 04/18/2021   Procedure: HOT HEMOSTASIS (ARGON PLASMA COAGULATION/BICAP);  Surgeon: Rollin Dover, MD;  Location: [facility] ENDOSCOPY;  Service: Endoscopy;  Laterality: N/A;   IR ANGIOGRAM SELECTIVE EACH ADDITIONAL VESSEL  04/17/2021   IR ANGIOGRAM VISCERAL SELECTIVE  04/17/2021   IR EMBO ART  VEN HEMORR LYMPH EXTRAV  INC GUIDE ROADMAPPING  04/17/2021   IR US  GUIDE VASC ACCESS RIGHT  04/17/2021   RADIOLOGY WITH ANESTHESIA N/A 04/17/2021   Procedure: RADIOLOGY WITH ANESTHESIA;  Surgeon: Radiologist, Medication, MD;  Location: [facility] OR;  Service: Radiology;  Laterality: N/A;   SUBMUCOSAL INJECTION  04/18/2021   Procedure: SUBMUCOSAL INJECTION;  Surgeon: Rollin Dover, MD;  Location: [facility] ENDOSCOPY;  Service: Endoscopy;;   Past Surgical History:  Procedure Laterality Date   ABDOMINAL HYSTERECTOMY     ?Lt. ovary remains   APPENDECTOMY     Arm surgery     BREAST BIOPSY Right 10/10/2009   BREAST BIOPSY Right 09/17/2022   MM RT BREAST BX W LOC DEV 1ST LESION IMAGE BX  SPEC STEREO GUIDE 09/17/2022 [facility]   BREAST CYST ASPIRATION  12/20/2013   BREAST EXCISIONAL BIOPSY Left 1998   BREAST EXCISIONAL BIOPSY Right 1993   ESOPHAGOGASTRODUODENOSCOPY N/A 04/17/2021   Procedure: ESOPHAGOGASTRODUODENOSCOPY (EGD);  Surgeon: Kristie Lamprey, MD;  Location: [facility] ENDOSCOPY;  Service: Endoscopy;  Laterality: N/A;  Rectal bleeding and anemia.   ESOPHAGOGASTRODUODENOSCOPY N/A 04/18/2021   Procedure: ESOPHAGOGASTRODUODENOSCOPY (EGD);  Surgeon: Rollin Dover, MD;  Location: [facility] ENDOSCOPY;  Service: Endoscopy;  Laterality: N/A;   HOT HEMOSTASIS N/A 04/18/2021  Procedure: HOT HEMOSTASIS (ARGON PLASMA COAGULATION/BICAP);  Surgeon: Rollin Dover, MD;  Location: [facility] ENDOSCOPY;  Service: Endoscopy;  Laterality: N/A;   IR ANGIOGRAM SELECTIVE EACH ADDITIONAL VESSEL  04/17/2021   IR ANGIOGRAM VISCERAL SELECTIVE  04/17/2021   IR EMBO ART  VEN HEMORR LYMPH EXTRAV  INC GUIDE ROADMAPPING  04/17/2021   IR US  GUIDE VASC ACCESS RIGHT  04/17/2021   RADIOLOGY WITH ANESTHESIA N/A 04/17/2021   Procedure: RADIOLOGY WITH ANESTHESIA;  Surgeon: Radiologist, Medication, MD;  Location: [facility] OR;  Service: Radiology;  Laterality: N/A;   SUBMUCOSAL INJECTION  04/18/2021   Procedure: SUBMUCOSAL INJECTION;  Surgeon: Rollin Dover, MD;  Location: [facility] ENDOSCOPY;  Service: Endoscopy;;   Past Medical History:  Diagnosis Date   Asthma    Depression    Diabetes mellitus without complication (HCC)    History of surgery on arm    right arm ( plate and screws)   Hypertension    Macular degeneration    Migraine    without aura   Pneumonia    Urinary incontinence    BP 128/74   Pulse 85   Ht 5' (1.524 m)   Wt 181 lb (82.1 kg)   SpO2 93%   BMI 35.35 kg/m   Opioid Risk Score:   Fall Risk Score:  `1  Depression screen [facility] 2/9     06/13/2024   10:53 AM 04/04/2024    1:43 PM 02/23/2024    1:19 PM 01/18/2024    9:07 AM 01/15/2024   10:47 AM 12/11/2023   11:22 AM 11/24/2023    2:10 PM  Depression screen PHQ 2/9   Decreased Interest 0 0 0 0 0 0 0  Down, Depressed, Hopeless 0 0 0 0 0 0 0  PHQ - 2 Score 0 0 0 0 0 0 0  Altered sleeping 0    0 0   Tired, decreased energy 0    0 0   Change in appetite 0    0 0   Feeling bad or failure about yourself  0    0 0   Trouble concentrating 0    0 0   Moving slowly or fidgety/restless 0    0 0   Suicidal thoughts     0 0   PHQ-9 Score 0     0  0    Difficult doing work/chores Not difficult at all           Data saved with a previous flowsheet row definition     Review of Systems     Objective:   Physical Exam Vitals and nursing note reviewed.  Constitutional:      Appearance: Normal appearance.  Cardiovascular:     Rate and Rhythm: Normal rate and regular rhythm.     Pulses: Normal pulses.     Heart sounds: Normal heart sounds.  Pulmonary:     Effort: Pulmonary effort is normal.     Breath sounds: Normal breath sounds.  Musculoskeletal:     Comments: Normal Muscle Bulk and Muscle Testing Reveals:  Upper Extremities: Right: Decreased ROM 30 Degrees and Muscle Strength  5/5 Right: AC Joint Tenderness Left Upper Extremity: Full ROM and Muscle Strength 5/5 Lumbar Paraspinal Tenderness: L-4-L-5 Lower Extremities:  Right: Decreased ROM and Muscle Strength 5/5 Right: Lower Extremity Flexion Produces Pain into her Lower Back Left Lower Extremity: Decreased ROM and Muscle Strength 5/5 Left Lower Extremity Flexion Produces Pain into her Left Patella Arises from Table slowly using walker for  support Antalgic Gait   Skin:    General: Skin is warm and dry.  Neurological:     Mental Status: She is alert and oriented to person, place, and time.  Psychiatric:        Mood and Affect: Mood normal.        Behavior: Behavior normal.          Assessment & Plan:  Chronic Bilateral Low Back Pain without sciatica: Continue  PT/PT weekly. Continue HEP as Tolerated. Continue to monitor. 08/02/2024 Chronic Bilateral Thoracic Back Pain: No complaints today.  Continue HEP as Tolerated. Continue to Monitor. 08/02/2024 Chronic Bilateral Thoracic Back pain without Sciatica:No complaints today.  Continue HEP as Tolerated. Continue to Monitor. 08/02/2024 Chronic Right Shoulder Pain: Receiving PT/OT Therapy she reports. Ortho Following. Continue to monitor. 08/02/2024 Chronic Left Knee Pain: Ortho following. Continue HEP as Tolerated. Continue to Monitor. 08/02/2024 6. Chronic Pain Syndrome: Refilled: Oxycodone  10/325 mg  one tablet 4 times a day as needed for pain,#120. We will continue the opioid monitoring program, this consists of regular clinic visits, examinations, urine drug screen, pill counts as well as use of [place]  Controlled Substance Reporting system. A 12 month History has been reviewed on the [place]  Controlled Substance Reporting System on 08/02/2024   F/U in 2 months     "

## 2024-08-03 ENCOUNTER — Other Ambulatory Visit (HOSPITAL_BASED_OUTPATIENT_CLINIC_OR_DEPARTMENT_OTHER): Payer: Self-pay

## 2024-08-03 ENCOUNTER — Other Ambulatory Visit (HOSPITAL_COMMUNITY): Payer: Self-pay

## 2024-08-06 ENCOUNTER — Other Ambulatory Visit (HOSPITAL_COMMUNITY): Payer: Self-pay

## 2024-08-08 ENCOUNTER — Telehealth: Payer: Self-pay | Admitting: Physician Assistant

## 2024-08-08 ENCOUNTER — Telehealth: Payer: Self-pay | Admitting: Orthopedic Surgery

## 2024-08-08 NOTE — Telephone Encounter (Signed)
Patient called. Would like the status of her gel injection.  

## 2024-08-09 ENCOUNTER — Other Ambulatory Visit (HOSPITAL_COMMUNITY): Payer: Self-pay

## 2024-08-10 ENCOUNTER — Other Ambulatory Visit: Payer: Self-pay

## 2024-08-10 ENCOUNTER — Other Ambulatory Visit (HOSPITAL_COMMUNITY): Payer: Self-pay

## 2024-08-15 ENCOUNTER — Telehealth: Payer: Self-pay

## 2024-08-15 NOTE — Telephone Encounter (Signed)
 VOB submitted for Monovisc

## 2024-08-15 NOTE — Telephone Encounter (Signed)
 Reviewed and agree with Dr Rennis plan.

## 2024-08-15 NOTE — Telephone Encounter (Signed)
 Patient contacts office with concern for glucose elevation with planned steroid injection tomorrow.   She reports taking 7 units of Lantus  (insulin  glargine) daily, currently, prior to the steroid injection.  Patient denies any significant medication related side effects.   Medication Plan: - Following steroid injection tomorrow AM - check glucose mid-day and if glucose < 200 take Lantus  (insulin  glargine) at same dose 7 units.  Then each day consider increasing dose of Lantus  (insulin  glargine) to 10 units once daily if pre-administration value is 200 or higher.   Instructed to call for low or concerning readings.  I will plan to call her on 08/19/24 to assess control.  Consider increase to 12 units if glucose values are > 300.  Discussed with PCP/Attending Total time with patient call and documentation of interaction: 12 minutes.

## 2024-08-15 NOTE — Telephone Encounter (Signed)
 Patient is going tomorrow for steroid injection in back.   Patient is currently taking 7 units per day. Patient was able to provide me with the following report of recent readings.  This AM, 08/15/24: 136 08/14/24: 127 08/13/24: 105 08/12/24:133 08/11/24: 110  Patient is asking if she should increase her insulin  to support increased glucose levels with steroid injection.   Forwarding to Dr. Koval. Please advise.   Chiquita JAYSON English, RN

## 2024-08-16 DIAGNOSIS — Z794 Long term (current) use of insulin: Secondary | ICD-10-CM

## 2024-08-16 MED ORDER — INSULIN GLARGINE 100 UNIT/ML SOLOSTAR PEN
12.0000 [IU] | PEN_INJECTOR | SUBCUTANEOUS | 1 refills | Status: DC
  Filled 2024-08-16: qty 15, 125d supply, fill #0

## 2024-08-16 NOTE — Telephone Encounter (Signed)
 Patient contacts office, reports getting steroid shot this AM.  She reports checking her glucose at 1:30 and it was 402.  She administered Lantus  (insulin  glargine) 10 units as discussed yesterday.  She reports attempting to recheck her glucose multiple times without success - machine reads - insert a new strip.   We discussed her readings are likely high related to her steroids and we discussed an additional small dose to help with lowering glucose over the next 24 hours.   Instructed to take additional 4 units at this time.  Then to increase dose to 12 units daily until we plan to talk on Friday   Total time with patient call and documentation of interaction: 13 minutes.

## 2024-08-17 ENCOUNTER — Other Ambulatory Visit: Payer: Self-pay

## 2024-08-17 ENCOUNTER — Other Ambulatory Visit (HOSPITAL_COMMUNITY): Payer: Self-pay

## 2024-08-17 NOTE — Telephone Encounter (Signed)
 Reviewed and agree with Dr Rennis plan.

## 2024-08-18 ENCOUNTER — Telehealth: Payer: Self-pay | Admitting: Pharmacist

## 2024-08-18 DIAGNOSIS — Z794 Long term (current) use of insulin: Secondary | ICD-10-CM

## 2024-08-18 MED ORDER — INSULIN GLARGINE 100 UNIT/ML SOLOSTAR PEN: 14.0000 [IU] | PEN_INJECTOR | SUBCUTANEOUS | Status: AC

## 2024-08-18 NOTE — Telephone Encounter (Signed)
 Patient contacts office, and states I thought you were going to call me today I replied that I had agreed to call her 1/9 mid-day prior to her insulin  injection.  She accepted that reply.   She reported her glucose values were 239, 326, 190 and 323 since we last talked.  She denies any symptoms of low glucose.  She report taking Lantus  (insulin  glargine) 14 units yesterday and today.   Following brief discussion, we agreed she would continue on Lantus  (insulin  glargine) 14 units once daily.  She was instructed to reduce to 12 units if she had any reading less than 100 over the next three day (including the weekend).   I agreed to call her Monday 1/12 for follow-up of glucose.    Total time with patient call and documentation of interaction: 12 minutes.

## 2024-08-19 NOTE — Telephone Encounter (Signed)
 Reviewed and agree with Dr Rennis plan.

## 2024-08-22 ENCOUNTER — Other Ambulatory Visit (HOSPITAL_COMMUNITY): Payer: Self-pay

## 2024-08-22 ENCOUNTER — Encounter: Payer: Self-pay | Admitting: *Deleted

## 2024-08-22 NOTE — Telephone Encounter (Signed)
 Attempted to contact patient for follow-up of glucose control X2 then patient returned call while I was out of office.    Returned call to patient who reports glucose readings in reverse order from today 176 -this AM 204 [phone]  Denies any symptoms of low glucose. Patient reports continued use of 14 units once daily  Following discussion, agreed to continuing Lantus  (insulin  glargine) 14 units daily at this time.  Plan to reduce from 14 to 12 units if any symptoms of low OR if glucose value is < 150 over the next few measurments.   Phone follow-up planned in 3 days.  Encouraged to return call if necessary.    Total time with patient call and documentation of interaction: 12 minutes.  Follow-up phone call planned: 08/25/2024 - mid day

## 2024-08-22 NOTE — Telephone Encounter (Signed)
 Patient calls nurse line regarding missed call from our office.   Patient reports that this may have been Dr. Amalia calling her. Advised patient that I would forward message to Dr. Koval.   Please advise.   Debra JAYSON English, RN

## 2024-08-23 NOTE — Telephone Encounter (Signed)
 Reviewed and agree with Dr Rennis plan.

## 2024-08-29 ENCOUNTER — Encounter: Payer: Self-pay | Admitting: Pharmacist

## 2024-08-29 NOTE — Progress Notes (Signed)
 This patient is appearing on a report for being at risk of failing the adherence measure for cholesterol (statin) medications this calendar year.   Medication: rosuvastatin  20 mg  Last fill date: 07/12/24 for 90 day supply  Reviewed medication indication, dosing, and goals of therapy.

## 2024-09-02 ENCOUNTER — Other Ambulatory Visit (HOSPITAL_COMMUNITY): Payer: Self-pay

## 2024-09-02 ENCOUNTER — Other Ambulatory Visit: Payer: Self-pay

## 2024-09-02 ENCOUNTER — Telehealth: Payer: Self-pay | Admitting: Pharmacist

## 2024-09-02 ENCOUNTER — Other Ambulatory Visit: Payer: Self-pay | Admitting: Family Medicine

## 2024-09-02 DIAGNOSIS — E119 Type 2 diabetes mellitus without complications: Secondary | ICD-10-CM

## 2024-09-02 MED ORDER — ONETOUCH VERIO VI STRP
ORAL_STRIP | 1 refills | Status: DC
  Filled 2024-09-02: qty 100, 50d supply, fill #0

## 2024-09-02 NOTE — Telephone Encounter (Signed)
 Patient contacts office, requesting assistance with glucose management.   Following recent steroid injection and dose escalation of Lantus  (insulin  glargine) from 7 to 14 units daily, patient reports fasting glucose readings of 149, 165, 174, 198, 186, 170, 191, 169  We agreed to reduce dose from 14 to 12 units today, Saturday and Sunday.  Instructed to decrease to 10 units on Monday 1/26 We also discussed decreasing back to eight units at anytime that she sees a reading less than 100.    Total time with patient call and documentation of interaction: 12 minutes.  I plan to call her 1/26 to assess control

## 2024-09-03 ENCOUNTER — Other Ambulatory Visit (HOSPITAL_COMMUNITY): Payer: Self-pay

## 2024-09-05 ENCOUNTER — Telehealth: Payer: Self-pay | Admitting: Pharmacist

## 2024-09-05 ENCOUNTER — Ambulatory Visit: Admitting: Physician Assistant

## 2024-09-05 ENCOUNTER — Other Ambulatory Visit: Payer: Self-pay | Admitting: Family Medicine

## 2024-09-05 ENCOUNTER — Other Ambulatory Visit (HOSPITAL_COMMUNITY): Payer: Self-pay

## 2024-09-05 DIAGNOSIS — E119 Type 2 diabetes mellitus without complications: Secondary | ICD-10-CM

## 2024-09-05 MED ORDER — ACCU-CHEK GUIDE W/DEVICE KIT
1.0000 | PACK | Freq: Once | 0 refills | Status: AC
  Filled 2024-09-05: qty 1, 30d supply, fill #0

## 2024-09-05 MED ORDER — ACCU-CHEK SOFTCLIX LANCET DEV KIT
1.0000 | PACK | Freq: Once | 0 refills | Status: AC
  Filled 2024-09-05: qty 1, fill #0

## 2024-09-05 MED ORDER — ACCU-CHEK SOFTCLIX LANCETS MISC
12 refills | Status: AC
  Filled 2024-09-05: qty 100, 100d supply, fill #0

## 2024-09-05 MED ORDER — ACCU-CHEK GUIDE TEST VI STRP
ORAL_STRIP | 12 refills | Status: AC
  Filled 2024-09-05: qty 100, 100d supply, fill #0

## 2024-09-05 NOTE — Telephone Encounter (Signed)
 Patient contacts office, requesting assistance with glucose monitor results and insulin  management.  Additionally, patient requests All New orders for her glucose testing supplies and device - now Accu-chek  Reports glucose of 169, 221, 185, 216 over the last 4 days.   Patient reports taking 12 units once daily - Lantus  (insulin  glargine).  Following discussion we agreed to continue 12 units once daily for a few more days.  She was instructed to reduce to 10 units daily if she had any fasting reading < 150 She verbalized understanding of plan.   New prescriptions sent to [facility] - for glucometer, test strips, lancets and device.   Total time with patient call and documentation of interaction: 18 minutes.

## 2024-09-06 ENCOUNTER — Other Ambulatory Visit: Payer: Self-pay | Admitting: Family Medicine

## 2024-09-06 NOTE — Telephone Encounter (Signed)
 Reviewed and agree with Dr Rennis plan.

## 2024-09-08 ENCOUNTER — Other Ambulatory Visit (HOSPITAL_COMMUNITY): Payer: Self-pay

## 2024-09-08 ENCOUNTER — Other Ambulatory Visit: Payer: Self-pay

## 2024-09-08 ENCOUNTER — Ambulatory Visit: Admitting: Physician Assistant

## 2024-09-12 ENCOUNTER — Telehealth: Payer: Self-pay | Admitting: Pharmacist

## 2024-09-12 ENCOUNTER — Other Ambulatory Visit (HOSPITAL_COMMUNITY): Payer: Self-pay

## 2024-09-13 NOTE — Telephone Encounter (Signed)
 Reviewed and agree with Dr Rennis plan.

## 2024-09-15 ENCOUNTER — Telehealth: Payer: Self-pay | Admitting: Pharmacist

## 2024-09-15 ENCOUNTER — Other Ambulatory Visit: Payer: Self-pay

## 2024-09-15 ENCOUNTER — Other Ambulatory Visit (HOSPITAL_COMMUNITY): Payer: Self-pay

## 2024-09-15 NOTE — Telephone Encounter (Signed)
 Attempted to contact patient for follow-up of glucose control after steroid injection  Patient reports recent glucose 162 on 2/3 and 125 on 2/4.  Patient reports she took 11 units of insulin .  Counseled her to go down insulin  dose 9 units daily for 2 days and 8 units for 2 days after that.  Total time with patient call and documentation of interaction: 17 minutes.  Follow-up phone call planned: 09/19/2024

## 2024-09-15 NOTE — Telephone Encounter (Signed)
 Reviewed and agree with Dr Rennis plan.

## 2024-09-19 ENCOUNTER — Ambulatory Visit: Admitting: Physician Assistant

## 2024-09-28 ENCOUNTER — Encounter: Admitting: Registered Nurse

## 2024-10-13 ENCOUNTER — Ambulatory Visit: Admitting: Obstetrics and Gynecology

## 2024-11-09 ENCOUNTER — Ambulatory Visit: Admitting: Obstetrics and Gynecology
